# Patient Record
Sex: Male | Born: 1935 | Race: White | Hispanic: No | Smoking: Former smoker
Health system: Southern US, Community
[De-identification: ages and names within clinical notes are randomized; demographics above are authoritative.]

## PROBLEM LIST (undated history)

## (undated) DIAGNOSIS — F410 Panic disorder [episodic paroxysmal anxiety] without agoraphobia: Secondary | ICD-10-CM

## (undated) DIAGNOSIS — R112 Nausea with vomiting, unspecified: Secondary | ICD-10-CM

## (undated) DIAGNOSIS — Z8719 Personal history of other diseases of the digestive system: Secondary | ICD-10-CM

## (undated) DIAGNOSIS — Z8709 Personal history of other diseases of the respiratory system: Secondary | ICD-10-CM

## (undated) DIAGNOSIS — E119 Type 2 diabetes mellitus without complications: Secondary | ICD-10-CM

## (undated) DIAGNOSIS — C439 Malignant melanoma of skin, unspecified: Secondary | ICD-10-CM

## (undated) DIAGNOSIS — Z8614 Personal history of Methicillin resistant Staphylococcus aureus infection: Secondary | ICD-10-CM

## (undated) DIAGNOSIS — R252 Cramp and spasm: Secondary | ICD-10-CM

## (undated) DIAGNOSIS — I10 Essential (primary) hypertension: Secondary | ICD-10-CM

## (undated) DIAGNOSIS — E785 Hyperlipidemia, unspecified: Secondary | ICD-10-CM

## (undated) DIAGNOSIS — R233 Spontaneous ecchymoses: Secondary | ICD-10-CM

## (undated) DIAGNOSIS — R238 Other skin changes: Secondary | ICD-10-CM

## (undated) DIAGNOSIS — J189 Pneumonia, unspecified organism: Secondary | ICD-10-CM

## (undated) DIAGNOSIS — Z9289 Personal history of other medical treatment: Secondary | ICD-10-CM

## (undated) DIAGNOSIS — Z8711 Personal history of peptic ulcer disease: Secondary | ICD-10-CM

## (undated) DIAGNOSIS — Z9889 Other specified postprocedural states: Secondary | ICD-10-CM

## (undated) DIAGNOSIS — D649 Anemia, unspecified: Secondary | ICD-10-CM

## (undated) HISTORY — PX: REPLACEMENT TOTAL KNEE BILATERAL: SUR1225

## (undated) HISTORY — PX: JOINT REPLACEMENT: SHX530

## (undated) HISTORY — PX: COLONOSCOPY: SHX174

## (undated) HISTORY — PX: NECK SURGERY: SHX720

## (undated) HISTORY — PX: CORONARY ANGIOPLASTY: SHX604

## (undated) HISTORY — PX: OTHER SURGICAL HISTORY: SHX169

## (undated) HISTORY — PX: BACK SURGERY: SHX140

---

## 1968-07-09 HISTORY — PX: CHOLECYSTECTOMY: SHX55

## 1996-03-25 DIAGNOSIS — E7800 Pure hypercholesterolemia, unspecified: Secondary | ICD-10-CM | POA: Insufficient documentation

## 1996-03-25 DIAGNOSIS — M129 Arthropathy, unspecified: Secondary | ICD-10-CM | POA: Insufficient documentation

## 1998-07-09 HISTORY — PX: CORONARY ARTERY BYPASS GRAFT: SHX141

## 1998-09-06 DIAGNOSIS — I10 Essential (primary) hypertension: Secondary | ICD-10-CM | POA: Insufficient documentation

## 1999-06-22 ENCOUNTER — Encounter: Payer: Self-pay | Admitting: Thoracic Surgery (Cardiothoracic Vascular Surgery)

## 1999-06-22 ENCOUNTER — Inpatient Hospital Stay (HOSPITAL_COMMUNITY): Admission: RE | Admit: 1999-06-22 | Discharge: 1999-06-29 | Payer: Self-pay | Admitting: *Deleted

## 1999-06-23 ENCOUNTER — Encounter: Payer: Self-pay | Admitting: Thoracic Surgery (Cardiothoracic Vascular Surgery)

## 1999-06-24 ENCOUNTER — Encounter: Payer: Self-pay | Admitting: Thoracic Surgery (Cardiothoracic Vascular Surgery)

## 1999-06-25 ENCOUNTER — Encounter: Payer: Self-pay | Admitting: Thoracic Surgery (Cardiothoracic Vascular Surgery)

## 1999-06-26 ENCOUNTER — Encounter: Payer: Self-pay | Admitting: Thoracic Surgery (Cardiothoracic Vascular Surgery)

## 1999-06-27 ENCOUNTER — Encounter: Payer: Self-pay | Admitting: Thoracic Surgery (Cardiothoracic Vascular Surgery)

## 1999-06-28 ENCOUNTER — Encounter: Payer: Self-pay | Admitting: Thoracic Surgery (Cardiothoracic Vascular Surgery)

## 1999-10-16 ENCOUNTER — Encounter: Payer: Self-pay | Admitting: Thoracic Surgery (Cardiothoracic Vascular Surgery)

## 1999-10-16 ENCOUNTER — Encounter
Admission: RE | Admit: 1999-10-16 | Discharge: 1999-10-16 | Payer: Self-pay | Admitting: Thoracic Surgery (Cardiothoracic Vascular Surgery)

## 2000-01-15 ENCOUNTER — Encounter: Payer: Self-pay | Admitting: Thoracic Surgery (Cardiothoracic Vascular Surgery)

## 2000-01-15 ENCOUNTER — Encounter
Admission: RE | Admit: 2000-01-15 | Discharge: 2000-01-15 | Payer: Self-pay | Admitting: Thoracic Surgery (Cardiothoracic Vascular Surgery)

## 2003-04-22 DIAGNOSIS — E1121 Type 2 diabetes mellitus with diabetic nephropathy: Secondary | ICD-10-CM | POA: Insufficient documentation

## 2003-08-23 ENCOUNTER — Other Ambulatory Visit: Payer: Self-pay

## 2003-12-30 ENCOUNTER — Encounter: Admission: RE | Admit: 2003-12-30 | Discharge: 2003-12-30 | Payer: Self-pay | Admitting: Neurosurgery

## 2004-01-25 ENCOUNTER — Inpatient Hospital Stay (HOSPITAL_COMMUNITY): Admission: RE | Admit: 2004-01-25 | Discharge: 2004-01-25 | Payer: Self-pay | Admitting: Neurosurgery

## 2004-02-22 ENCOUNTER — Ambulatory Visit (HOSPITAL_COMMUNITY): Admission: RE | Admit: 2004-02-22 | Discharge: 2004-02-22 | Payer: Self-pay | Admitting: *Deleted

## 2004-04-04 ENCOUNTER — Inpatient Hospital Stay (HOSPITAL_COMMUNITY): Admission: RE | Admit: 2004-04-04 | Discharge: 2004-04-07 | Payer: Self-pay | Admitting: Neurosurgery

## 2004-05-30 ENCOUNTER — Ambulatory Visit: Payer: Self-pay | Admitting: Physician Assistant

## 2004-06-20 ENCOUNTER — Other Ambulatory Visit: Payer: Self-pay

## 2004-06-26 ENCOUNTER — Inpatient Hospital Stay: Payer: Self-pay | Admitting: Unknown Physician Specialty

## 2004-12-28 ENCOUNTER — Other Ambulatory Visit: Payer: Self-pay

## 2004-12-28 ENCOUNTER — Ambulatory Visit: Payer: Self-pay | Admitting: Unknown Physician Specialty

## 2005-01-10 ENCOUNTER — Ambulatory Visit: Payer: Self-pay | Admitting: Unknown Physician Specialty

## 2005-04-02 ENCOUNTER — Ambulatory Visit: Payer: Self-pay | Admitting: Pain Medicine

## 2005-04-10 ENCOUNTER — Ambulatory Visit: Payer: Self-pay | Admitting: Pain Medicine

## 2005-04-14 ENCOUNTER — Ambulatory Visit: Payer: Self-pay | Admitting: Pain Medicine

## 2005-04-16 ENCOUNTER — Ambulatory Visit: Payer: Self-pay | Admitting: Pain Medicine

## 2005-05-01 ENCOUNTER — Ambulatory Visit: Payer: Self-pay | Admitting: Pain Medicine

## 2005-05-14 ENCOUNTER — Ambulatory Visit: Payer: Self-pay | Admitting: Physician Assistant

## 2005-05-28 ENCOUNTER — Ambulatory Visit: Payer: Self-pay | Admitting: Pain Medicine

## 2005-05-29 ENCOUNTER — Ambulatory Visit: Payer: Self-pay | Admitting: Pain Medicine

## 2005-06-27 ENCOUNTER — Ambulatory Visit: Payer: Self-pay | Admitting: Physician Assistant

## 2005-11-19 ENCOUNTER — Ambulatory Visit: Payer: Self-pay | Admitting: Family Medicine

## 2008-03-10 ENCOUNTER — Ambulatory Visit: Payer: Self-pay | Admitting: Gastroenterology

## 2009-09-05 DIAGNOSIS — L0591 Pilonidal cyst without abscess: Secondary | ICD-10-CM | POA: Insufficient documentation

## 2010-06-28 ENCOUNTER — Other Ambulatory Visit: Payer: Self-pay | Admitting: Ophthalmology

## 2010-07-05 ENCOUNTER — Ambulatory Visit: Payer: Self-pay | Admitting: Ophthalmology

## 2010-07-09 HISTORY — PX: TOTAL HIP ARTHROPLASTY: SHX124

## 2010-07-30 ENCOUNTER — Encounter: Payer: Self-pay | Admitting: Neurosurgery

## 2010-08-30 ENCOUNTER — Ambulatory Visit: Payer: Self-pay | Admitting: Ophthalmology

## 2010-09-11 ENCOUNTER — Ambulatory Visit: Payer: Self-pay | Admitting: Ophthalmology

## 2010-10-06 ENCOUNTER — Ambulatory Visit: Payer: Self-pay | Admitting: Ophthalmology

## 2010-10-16 ENCOUNTER — Ambulatory Visit: Payer: Self-pay | Admitting: Ophthalmology

## 2010-10-19 ENCOUNTER — Ambulatory Visit: Payer: Self-pay | Admitting: Family Medicine

## 2010-12-21 ENCOUNTER — Ambulatory Visit: Payer: Self-pay | Admitting: Unknown Physician Specialty

## 2010-12-21 DIAGNOSIS — K2950 Unspecified chronic gastritis without bleeding: Secondary | ICD-10-CM | POA: Insufficient documentation

## 2011-03-13 ENCOUNTER — Ambulatory Visit: Payer: Self-pay | Admitting: General Practice

## 2011-03-28 ENCOUNTER — Inpatient Hospital Stay: Payer: Self-pay | Admitting: General Practice

## 2011-03-30 LAB — PATHOLOGY REPORT

## 2011-04-05 ENCOUNTER — Encounter: Payer: Self-pay | Admitting: Internal Medicine

## 2011-04-09 ENCOUNTER — Encounter: Payer: Self-pay | Admitting: Internal Medicine

## 2011-06-07 ENCOUNTER — Ambulatory Visit: Payer: Self-pay | Admitting: Family Medicine

## 2012-02-04 ENCOUNTER — Ambulatory Visit: Payer: Self-pay | Admitting: Family Medicine

## 2012-05-20 ENCOUNTER — Other Ambulatory Visit: Payer: Self-pay | Admitting: Physician Assistant

## 2012-05-27 ENCOUNTER — Encounter (HOSPITAL_COMMUNITY): Payer: Self-pay

## 2012-05-27 ENCOUNTER — Encounter (HOSPITAL_COMMUNITY)
Admission: RE | Admit: 2012-05-27 | Discharge: 2012-05-27 | Disposition: A | Discharge status: Home / Self Care | Payer: Medicare Other | Source: Ambulatory Visit | Attending: Anesthesiology | Admitting: Anesthesiology

## 2012-05-27 ENCOUNTER — Encounter (HOSPITAL_COMMUNITY)
Admission: RE | Admit: 2012-05-27 | Discharge: 2012-05-27 | Disposition: A | Discharge status: Home / Self Care | Payer: Medicare Other | Source: Ambulatory Visit | Attending: Orthopedic Surgery | Admitting: Orthopedic Surgery

## 2012-05-27 HISTORY — DX: Personal history of other diseases of the digestive system: Z87.19

## 2012-05-27 HISTORY — DX: Type 2 diabetes mellitus without complications: E11.9

## 2012-05-27 HISTORY — DX: Personal history of peptic ulcer disease: Z87.11

## 2012-05-27 HISTORY — DX: Pneumonia, unspecified organism: J18.9

## 2012-05-27 HISTORY — DX: Personal history of other diseases of the respiratory system: Z87.09

## 2012-05-27 HISTORY — DX: Personal history of other medical treatment: Z92.89

## 2012-05-27 HISTORY — DX: Essential (primary) hypertension: I10

## 2012-05-27 HISTORY — DX: Other specified postprocedural states: R11.2

## 2012-05-27 HISTORY — DX: Hyperlipidemia, unspecified: E78.5

## 2012-05-27 HISTORY — DX: Spontaneous ecchymoses: R23.3

## 2012-05-27 HISTORY — DX: Other skin changes: R23.8

## 2012-05-27 HISTORY — DX: Personal history of Methicillin resistant Staphylococcus aureus infection: Z86.14

## 2012-05-27 HISTORY — DX: Cramp and spasm: R25.2

## 2012-05-27 HISTORY — DX: Other specified postprocedural states: Z98.890

## 2012-05-27 HISTORY — DX: Malignant melanoma of skin, unspecified: C43.9

## 2012-05-27 LAB — CBC
HCT: 35.7 % — ABNORMAL LOW (ref 39.0–52.0)
MCV: 90.6 fL (ref 78.0–100.0)
RBC: 3.94 MIL/uL — ABNORMAL LOW (ref 4.22–5.81)
WBC: 4.6 10*3/uL (ref 4.0–10.5)

## 2012-05-27 LAB — BASIC METABOLIC PANEL
CO2: 26 mEq/L (ref 19–32)
Chloride: 102 mEq/L (ref 96–112)
Sodium: 137 mEq/L (ref 135–145)

## 2012-05-27 LAB — PROTIME-INR
INR: 0.99 (ref 0.00–1.49)
Prothrombin Time: 13 seconds (ref 11.6–15.2)

## 2012-05-27 LAB — SURGICAL PCR SCREEN
MRSA, PCR: POSITIVE — AB
Staphylococcus aureus: POSITIVE — AB

## 2012-05-27 LAB — TYPE AND SCREEN

## 2012-05-27 MED ORDER — CHLORHEXIDINE GLUCONATE 4 % EX LIQD: 60.0000 mL | Freq: Once | CUTANEOUS | Status: DC

## 2012-05-27 NOTE — Progress Notes (Signed)
Dr [facility] in [place] is cardiologist-to request ov  Stress test done about 2-20months ago-to request Heart cath done about 57yrs ago Echo done about 2-3 months ago-to request   EKG done about 2-3 months ago Denies CXR within the past yr   Dr.DOnal Radiographer, therapeutic with [facility] is medical Md

## 2012-05-27 NOTE — Progress Notes (Signed)
Sleep study done about 55yrs ago-with Dr.Khan-to request from Medical MD

## 2012-05-27 NOTE — Pre-Procedure Instructions (Signed)
[address]  05/27/2012   Your procedure is scheduled on:  Fri, Nov 22 @ 10:30 AM  Report to [facility] at 8:30 AM.  Call this number if you have problems the morning of surgery: [phone]   Remember:   Do not eat food:After Midnight.    Take these medicines the morning of surgery with A SIP OF WATER: Omeprazole(Prilosec),Metoprolol(Lopressor),Claritin(Loratadine), and Gabapentin(Neurontin)   Do not wear jewelry  Do not wear lotions, powders, or colognes. You may wear deodorant.  Men may shave face and neck.  Do not bring valuables to the hospital.  Contacts, dentures or bridgework may not be worn into surgery.  Leave suitcase in the car. After surgery it may be brought to your room.  For patients admitted to the hospital, checkout time is 11:00 AM the day of discharge.   Patients discharged the day of surgery will not be allowed to drive home.    Special Instructions: Shower using CHG 2 nights before surgery and the night before surgery.  If you shower the day of surgery use CHG.  Use special wash - you have one bottle of CHG for all showers.  You should use approximately 1/3 of the bottle for each shower.   Please read over the following fact sheets that you were given: Pain Booklet, Coughing and Deep Breathing, Blood Transfusion Information, MRSA Information and Surgical Site Infection Prevention

## 2012-05-27 NOTE — H&P (Signed)
CC: right shoulder pain and weakness HPI: 77   Y/o male    With worsening right shoulder pain secondary to osteoarthritis. Patient has elected to have a total shoulder arthroplasty to decrease pain and increase function. 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, therapeutic, former smoker, no alcohol or illicit drug use Meds: lasix, metoprolol, aspirin, centrum, fluticasone, mobic, potassium chloride, quinine, gabapentin, glimipiride, simvastatin, doxazon, loratadine, trazodone Allergies: darvon, demerol ROS: Pain and weakness right shoulder and upper extremity Vitals: 128/78 78, 12 PE: Alert and appropriate 77 y/o male   In no acute distress Cranial 2-12 grossly intact Cervical spine full rom without pain Lungs: bilateral breath sounds with no wheeze rhonchi or rales Heart: regular rate and rhythm with no murmur Abd: nontender nondistended with active bowel sounds Ext: moderate pain with rom of right shoulder, weakness 4/5 with ER and IR  neurovascularly intact distally. 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

## 2012-05-27 NOTE — Consult Note (Addendum)
Anesthesia chart review: Patient is a 77 year old male scheduled for right reverse total shoulder arthroplasty on 05/30/12 by Dr. Ranell Patrick.  History includes former smoker, morbid obesity, postoperative nausea and vomiting, hyperlipidemia, COPD, CAD, emphysema, obstructive sleep apnea (Dr. Meredeth Ide), melanoma, hiatal hernia, GERD, gastric ulcer, hypertension, diabetes mellitus type 2, prior back, neck, cataract, gallbladder, bilateral knee replacement and right hip replacement surgeries.  PCP is Dr. Mila Merry who gave medical clearance for this procedure.    Pulmonologist is Dr. Ned Clines who was notified of planned procedure and cleared patient from a pulmonary standpoint.  Cardiologist is Dr. Dorothyann Peng.  He cleared patient from a cardiology standpoint.  Additional records are still pending.  CXR on 05/27/12 showed no acute cardiopulmonary disease. A left shoulder prosthesis is present. Sternotomy wires are unchanged. Lungs are hypoinflated with mild linear density in the lateral left midlung likely scarring. There is no  consolidation or effusion. Cardiomediastinal silhouette and  remainder of the exam is unchanged.   Labs noted.  Glucose 133.  H/H 12.3/35.7.  PLT 105.  PT/PTT WNL.  T&S done.  Currently, there are no comparison labs noted.  PLT count called to Clydie Braun at Dr. Ranell Patrick' office.  She will review with Dr. Ranell Patrick or his PA [facility].  I'll follow-up once additional records are received.  Shonna Chock, PA-C 05/27/12 1559  Addendum: 05/29/12 1612 I have called [facility] twice for records (most recent stress, echo, EKG, office note).  The last stress test faxed was from 2008, although patient reported one more recently.  I attempted to call Mr. Genova today, but only got a voicemail.  His echo from 09/14/11 appeared limited (I don't see any documentation of valvular disease.  EF 55%. There was not even an EKG within the past year, so he will need one on arrival.  .  [facility]  did not have record of a stress or echo within the last three years.  His PCP records did not include a previous CBC, but since his PLT count is > 100,000 then anticipate he can proceed (surgeon's office was notified).  He has clearance notes from his PCP, Pulmonologist, and Cardiologist.

## 2012-05-29 MED ORDER — CEFAZOLIN SODIUM-DEXTROSE 2-3 GM-% IV SOLR
2.0000 g | INTRAVENOUS | Status: AC
  Administered 2012-05-30: 2 g via INTRAVENOUS
  Filled 2012-05-29: qty 50

## 2012-05-30 ENCOUNTER — Encounter (HOSPITAL_COMMUNITY): Payer: Self-pay | Admitting: Vascular Surgery

## 2012-05-30 ENCOUNTER — Inpatient Hospital Stay (HOSPITAL_COMMUNITY)
Admission: RE | Admit: 2012-05-30 | Discharge: 2012-05-31 | DRG: 484 | Disposition: A | Discharge status: Home / Self Care | Payer: Medicare Other | Source: Ambulatory Visit | Attending: Orthopedic Surgery | Admitting: Orthopedic Surgery

## 2012-05-30 ENCOUNTER — Encounter (HOSPITAL_COMMUNITY): Payer: Self-pay | Admitting: *Deleted

## 2012-05-30 ENCOUNTER — Inpatient Hospital Stay (HOSPITAL_COMMUNITY): Payer: Medicare Other | Admitting: Vascular Surgery

## 2012-05-30 ENCOUNTER — Encounter (HOSPITAL_COMMUNITY)
Admission: RE | Disposition: A | Discharge status: Home / Self Care | Payer: Self-pay | Source: Ambulatory Visit | Attending: Orthopedic Surgery

## 2012-05-30 ENCOUNTER — Inpatient Hospital Stay (HOSPITAL_COMMUNITY): Payer: Medicare Other

## 2012-05-30 DIAGNOSIS — K449 Diaphragmatic hernia without obstruction or gangrene: Secondary | ICD-10-CM | POA: Diagnosis present

## 2012-05-30 DIAGNOSIS — I10 Essential (primary) hypertension: Secondary | ICD-10-CM | POA: Diagnosis present

## 2012-05-30 DIAGNOSIS — M719 Bursopathy, unspecified: Secondary | ICD-10-CM | POA: Diagnosis present

## 2012-05-30 DIAGNOSIS — Z01818 Encounter for other preprocedural examination: Secondary | ICD-10-CM

## 2012-05-30 DIAGNOSIS — Z7982 Long term (current) use of aspirin: Secondary | ICD-10-CM

## 2012-05-30 DIAGNOSIS — F411 Generalized anxiety disorder: Secondary | ICD-10-CM | POA: Diagnosis present

## 2012-05-30 DIAGNOSIS — I2510 Atherosclerotic heart disease of native coronary artery without angina pectoris: Secondary | ICD-10-CM | POA: Diagnosis present

## 2012-05-30 DIAGNOSIS — J438 Other emphysema: Secondary | ICD-10-CM | POA: Diagnosis present

## 2012-05-30 DIAGNOSIS — K219 Gastro-esophageal reflux disease without esophagitis: Secondary | ICD-10-CM | POA: Diagnosis present

## 2012-05-30 DIAGNOSIS — Z01812 Encounter for preprocedural laboratory examination: Secondary | ICD-10-CM

## 2012-05-30 DIAGNOSIS — E785 Hyperlipidemia, unspecified: Secondary | ICD-10-CM | POA: Diagnosis present

## 2012-05-30 DIAGNOSIS — Z87891 Personal history of nicotine dependence: Secondary | ICD-10-CM

## 2012-05-30 DIAGNOSIS — E669 Obesity, unspecified: Secondary | ICD-10-CM | POA: Diagnosis present

## 2012-05-30 DIAGNOSIS — Z79899 Other long term (current) drug therapy: Secondary | ICD-10-CM

## 2012-05-30 DIAGNOSIS — M19019 Primary osteoarthritis, unspecified shoulder: Principal | ICD-10-CM | POA: Diagnosis present

## 2012-05-30 DIAGNOSIS — E119 Type 2 diabetes mellitus without complications: Secondary | ICD-10-CM | POA: Diagnosis present

## 2012-05-30 DIAGNOSIS — M67919 Unspecified disorder of synovium and tendon, unspecified shoulder: Secondary | ICD-10-CM | POA: Diagnosis present

## 2012-05-30 HISTORY — PX: REVERSE SHOULDER ARTHROPLASTY: SHX5054

## 2012-05-30 LAB — GLUCOSE, CAPILLARY
Glucose-Capillary: 158 mg/dL — ABNORMAL HIGH (ref 70–99)
Glucose-Capillary: 135 mg/dL — ABNORMAL HIGH (ref 70–99)
Glucose-Capillary: 163 mg/dL — ABNORMAL HIGH (ref 70–99)

## 2012-05-30 SURGERY — ARTHROPLASTY, SHOULDER, TOTAL, REVERSE
Anesthesia: General | Site: Shoulder | Laterality: Right | Wound class: Clean

## 2012-05-30 MED ORDER — FUROSEMIDE 40 MG PO TABS
40.0000 mg | ORAL_TABLET | Freq: Every day | ORAL | Status: DC
  Administered 2012-05-30 – 2012-05-31 (×2): 40 mg via ORAL
  Filled 2012-05-30 (×2): qty 1

## 2012-05-30 MED ORDER — METHOCARBAMOL 500 MG PO TABS: 500.0000 mg | ORAL_TABLET | Freq: Three times a day (TID) | ORAL | Status: DC | PRN

## 2012-05-30 MED ORDER — METOCLOPRAMIDE HCL 5 MG/ML IJ SOLN: 5.0000 mg | Freq: Three times a day (TID) | INTRAMUSCULAR | Status: DC | PRN

## 2012-05-30 MED ORDER — GLIMEPIRIDE 1 MG PO TABS
1.0000 mg | ORAL_TABLET | Freq: Every day | ORAL | Status: DC
  Administered 2012-05-31: 1 mg via ORAL
  Filled 2012-05-30 (×2): qty 1

## 2012-05-30 MED ORDER — OXYCODONE-ACETAMINOPHEN 5-325 MG PO TABS: 1.0000 | ORAL_TABLET | ORAL | Status: DC | PRN

## 2012-05-30 MED ORDER — SIMVASTATIN 40 MG PO TABS
40.0000 mg | ORAL_TABLET | Freq: Every day | ORAL | Status: DC
  Administered 2012-05-30: 40 mg via ORAL
  Filled 2012-05-30 (×2): qty 1

## 2012-05-30 MED ORDER — FENTANYL CITRATE 0.05 MG/ML IJ SOLN
INTRAMUSCULAR | Status: DC | PRN
  Administered 2012-05-30: 25 ug via INTRAVENOUS
  Administered 2012-05-30: 100 ug via INTRAVENOUS

## 2012-05-30 MED ORDER — GABAPENTIN 300 MG PO CAPS
300.0000 mg | ORAL_CAPSULE | Freq: Three times a day (TID) | ORAL | Status: DC
  Administered 2012-05-30 – 2012-05-31 (×3): 300 mg via ORAL
  Filled 2012-05-30 (×5): qty 1

## 2012-05-30 MED ORDER — BUPIVACAINE-EPINEPHRINE PF 0.25-1:200000 % IJ SOLN
INTRAMUSCULAR | Status: AC
  Filled 2012-05-30: qty 30

## 2012-05-30 MED ORDER — PANTOPRAZOLE SODIUM 40 MG PO TBEC
40.0000 mg | DELAYED_RELEASE_TABLET | Freq: Every day | ORAL | Status: DC
  Administered 2012-05-31: 40 mg via ORAL
  Filled 2012-05-30: qty 1

## 2012-05-30 MED ORDER — METHOCARBAMOL 500 MG PO TABS
500.0000 mg | ORAL_TABLET | Freq: Four times a day (QID) | ORAL | Status: DC | PRN
  Administered 2012-05-31: 500 mg via ORAL
  Filled 2012-05-30: qty 1

## 2012-05-30 MED ORDER — ONDANSETRON HCL 4 MG/2ML IJ SOLN
INTRAMUSCULAR | Status: AC
  Administered 2012-05-30: 4 mg via INTRAVENOUS
  Filled 2012-05-30: qty 2

## 2012-05-30 MED ORDER — MIDAZOLAM HCL 2 MG/2ML IJ SOLN
INTRAMUSCULAR | Status: AC
  Filled 2012-05-30: qty 2

## 2012-05-30 MED ORDER — ONDANSETRON HCL 4 MG/2ML IJ SOLN: 4.0000 mg | Freq: Four times a day (QID) | INTRAMUSCULAR | Status: DC | PRN

## 2012-05-30 MED ORDER — METHOCARBAMOL 100 MG/ML IJ SOLN
500.0000 mg | Freq: Four times a day (QID) | INTRAVENOUS | Status: DC | PRN
  Filled 2012-05-30: qty 5

## 2012-05-30 MED ORDER — AMOXICILLIN 500 MG PO CAPS: 500.0000 mg | ORAL_CAPSULE | ORAL | Status: DC

## 2012-05-30 MED ORDER — OXYCODONE HCL 5 MG PO TABS
5.0000 mg | ORAL_TABLET | ORAL | Status: DC | PRN
  Administered 2012-05-30: 10 mg via ORAL
  Administered 2012-05-31: 5 mg via ORAL
  Administered 2012-05-31: 10 mg via ORAL
  Administered 2012-05-31: 5 mg via ORAL
  Filled 2012-05-30: qty 1
  Filled 2012-05-30 (×2): qty 2
  Filled 2012-05-30: qty 1

## 2012-05-30 MED ORDER — HYDROMORPHONE HCL PF 1 MG/ML IJ SOLN
INTRAMUSCULAR | Status: AC
  Administered 2012-05-30: 0.25 mg via INTRAVENOUS
  Filled 2012-05-30: qty 1

## 2012-05-30 MED ORDER — LACTATED RINGERS IV SOLN
INTRAVENOUS | Status: DC | PRN
  Administered 2012-05-30 (×2): via INTRAVENOUS

## 2012-05-30 MED ORDER — SODIUM CHLORIDE 0.9 % IV SOLN: INTRAVENOUS | Status: DC

## 2012-05-30 MED ORDER — ACETAMINOPHEN 325 MG PO TABS: 650.0000 mg | ORAL_TABLET | Freq: Four times a day (QID) | ORAL | Status: DC | PRN

## 2012-05-30 MED ORDER — KETOROLAC TROMETHAMINE 30 MG/ML IJ SOLN
INTRAMUSCULAR | Status: DC | PRN
  Administered 2012-05-30: 15 mg via INTRAVENOUS

## 2012-05-30 MED ORDER — ACETAMINOPHEN 10 MG/ML IV SOLN: 1000.0000 mg | Freq: Once | INTRAVENOUS | Status: DC | PRN

## 2012-05-30 MED ORDER — ONDANSETRON HCL 4 MG/2ML IJ SOLN
4.0000 mg | Freq: Once | INTRAMUSCULAR | Status: AC | PRN
  Administered 2012-05-30: 4 mg via INTRAVENOUS

## 2012-05-30 MED ORDER — METOPROLOL TARTRATE 25 MG PO TABS
25.0000 mg | ORAL_TABLET | Freq: Two times a day (BID) | ORAL | Status: DC
  Administered 2012-05-30 – 2012-05-31 (×2): 25 mg via ORAL
  Filled 2012-05-30 (×3): qty 1

## 2012-05-30 MED ORDER — ACETAMINOPHEN 650 MG RE SUPP: 650.0000 mg | Freq: Four times a day (QID) | RECTAL | Status: DC | PRN

## 2012-05-30 MED ORDER — ONDANSETRON HCL 4 MG PO TABS: 4.0000 mg | ORAL_TABLET | Freq: Four times a day (QID) | ORAL | Status: DC | PRN

## 2012-05-30 MED ORDER — PHENYLEPHRINE HCL 10 MG/ML IJ SOLN
INTRAMUSCULAR | Status: DC | PRN
  Administered 2012-05-30 (×5): 80 ug via INTRAVENOUS

## 2012-05-30 MED ORDER — METOCLOPRAMIDE HCL 10 MG PO TABS: 5.0000 mg | ORAL_TABLET | Freq: Three times a day (TID) | ORAL | Status: DC | PRN

## 2012-05-30 MED ORDER — ONDANSETRON HCL 4 MG/2ML IJ SOLN
INTRAMUSCULAR | Status: DC | PRN
  Administered 2012-05-30: 4 mg via INTRAVENOUS

## 2012-05-30 MED ORDER — FLUTICASONE PROPIONATE 50 MCG/ACT NA SUSP
2.0000 | Freq: Every day | NASAL | Status: DC
  Administered 2012-05-31: 2 via NASAL
  Filled 2012-05-30: qty 16

## 2012-05-30 MED ORDER — ACETAMINOPHEN 10 MG/ML IV SOLN
1000.0000 mg | Freq: Once | INTRAVENOUS | Status: AC
  Administered 2012-05-30: 1000 mg via INTRAVENOUS
  Filled 2012-05-30: qty 100

## 2012-05-30 MED ORDER — ACETAMINOPHEN 10 MG/ML IV SOLN
INTRAVENOUS | Status: AC
  Filled 2012-05-30: qty 100

## 2012-05-30 MED ORDER — FENTANYL CITRATE 0.05 MG/ML IJ SOLN
INTRAMUSCULAR | Status: AC
  Filled 2012-05-30: qty 2

## 2012-05-30 MED ORDER — QUININE SULFATE 324 MG PO CAPS
648.0000 mg | ORAL_CAPSULE | Freq: Three times a day (TID) | ORAL | Status: DC | PRN
  Filled 2012-05-30: qty 2

## 2012-05-30 MED ORDER — MELOXICAM 15 MG PO TABS
15.0000 mg | ORAL_TABLET | Freq: Every day | ORAL | Status: DC
  Administered 2012-05-30 – 2012-05-31 (×2): 15 mg via ORAL
  Filled 2012-05-30 (×2): qty 1

## 2012-05-30 MED ORDER — MIDAZOLAM HCL 2 MG/2ML IJ SOLN
2.0000 mg | Freq: Once | INTRAMUSCULAR | Status: AC
  Administered 2012-05-30: 2 mg via INTRAVENOUS

## 2012-05-30 MED ORDER — INSULIN ASPART 100 UNIT/ML ~~LOC~~ SOLN
0.0000 [IU] | Freq: Three times a day (TID) | SUBCUTANEOUS | Status: DC
  Administered 2012-05-31: 3 [IU] via SUBCUTANEOUS

## 2012-05-30 MED ORDER — ASPIRIN 81 MG PO CHEW
81.0000 mg | CHEWABLE_TABLET | Freq: Every day | ORAL | Status: DC
  Administered 2012-05-31: 81 mg via ORAL
  Filled 2012-05-30: qty 1

## 2012-05-30 MED ORDER — LACTATED RINGERS IV SOLN
INTRAVENOUS | Status: DC
  Administered 2012-05-30: 10:00:00 via INTRAVENOUS

## 2012-05-30 MED ORDER — POTASSIUM CHLORIDE CRYS ER 20 MEQ PO TBCR
20.0000 meq | EXTENDED_RELEASE_TABLET | Freq: Every day | ORAL | Status: DC
  Administered 2012-05-30 – 2012-05-31 (×2): 20 meq via ORAL
  Filled 2012-05-30 (×3): qty 1

## 2012-05-30 MED ORDER — MENTHOL 3 MG MT LOZG: 1.0000 | LOZENGE | OROMUCOSAL | Status: DC | PRN

## 2012-05-30 MED ORDER — MORPHINE SULFATE 2 MG/ML IJ SOLN: 2.0000 mg | INTRAMUSCULAR | Status: DC | PRN

## 2012-05-30 MED ORDER — MUPIROCIN 2 % EX OINT
TOPICAL_OINTMENT | CUTANEOUS | Status: AC
  Administered 2012-05-30: 1 via NASAL
  Filled 2012-05-30: qty 22

## 2012-05-30 MED ORDER — LIDOCAINE HCL (CARDIAC) 20 MG/ML IV SOLN
INTRAVENOUS | Status: DC | PRN
  Administered 2012-05-30: 40 mg via INTRAVENOUS

## 2012-05-30 MED ORDER — PHENOL 1.4 % MT LIQD: 1.0000 | OROMUCOSAL | Status: DC | PRN

## 2012-05-30 MED ORDER — ROCURONIUM BROMIDE 100 MG/10ML IV SOLN
INTRAVENOUS | Status: DC | PRN
  Administered 2012-05-30: 50 mg via INTRAVENOUS

## 2012-05-30 MED ORDER — PROPOFOL 10 MG/ML IV BOLUS
INTRAVENOUS | Status: DC | PRN
  Administered 2012-05-30: 150 mg via INTRAVENOUS

## 2012-05-30 MED ORDER — ADULT MULTIVITAMIN W/MINERALS CH
1.0000 | ORAL_TABLET | Freq: Every day | ORAL | Status: DC
  Administered 2012-05-31: 1 via ORAL
  Filled 2012-05-30 (×2): qty 1

## 2012-05-30 MED ORDER — INSULIN ASPART 100 UNIT/ML ~~LOC~~ SOLN
4.0000 [IU] | Freq: Three times a day (TID) | SUBCUTANEOUS | Status: DC
  Administered 2012-05-31: 4 [IU] via SUBCUTANEOUS

## 2012-05-30 MED ORDER — ASPIRIN 81 MG PO TABS: 81.0000 mg | ORAL_TABLET | Freq: Every day | ORAL | Status: DC

## 2012-05-30 MED ORDER — FENTANYL CITRATE 0.05 MG/ML IJ SOLN
100.0000 ug | Freq: Once | INTRAMUSCULAR | Status: AC
  Administered 2012-05-30: 100 ug via INTRAVENOUS

## 2012-05-30 MED ORDER — TRAZODONE HCL 50 MG PO TABS
50.0000 mg | ORAL_TABLET | Freq: Every day | ORAL | Status: DC
  Administered 2012-05-30: 50 mg via ORAL
  Filled 2012-05-30 (×2): qty 1

## 2012-05-30 MED ORDER — ACETAMINOPHEN 10 MG/ML IV SOLN
1000.0000 mg | Freq: Four times a day (QID) | INTRAVENOUS | Status: DC
  Administered 2012-05-30 – 2012-05-31 (×3): 1000 mg via INTRAVENOUS
  Filled 2012-05-30 (×4): qty 100

## 2012-05-30 MED ORDER — LORATADINE 10 MG PO TABS
10.0000 mg | ORAL_TABLET | Freq: Every day | ORAL | Status: DC
  Administered 2012-05-30 – 2012-05-31 (×2): 10 mg via ORAL
  Filled 2012-05-30 (×2): qty 1

## 2012-05-30 MED ORDER — DOXAZOSIN MESYLATE 8 MG PO TABS
8.0000 mg | ORAL_TABLET | Freq: Every day | ORAL | Status: DC
  Administered 2012-05-30: 8 mg via ORAL
  Filled 2012-05-30 (×2): qty 1

## 2012-05-30 MED ORDER — INSULIN ASPART 100 UNIT/ML ~~LOC~~ SOLN: 0.0000 [IU] | Freq: Every day | SUBCUTANEOUS | Status: DC

## 2012-05-30 MED ORDER — BISACODYL 10 MG RE SUPP: 10.0000 mg | Freq: Every day | RECTAL | Status: DC | PRN

## 2012-05-30 MED ORDER — SODIUM CHLORIDE 0.9 % IR SOLN
Status: DC | PRN
  Administered 2012-05-30: 1000 mL

## 2012-05-30 MED ORDER — CEFAZOLIN SODIUM-DEXTROSE 2-3 GM-% IV SOLR
2.0000 g | Freq: Four times a day (QID) | INTRAVENOUS | Status: AC
  Administered 2012-05-30 – 2012-05-31 (×3): 2 g via INTRAVENOUS
  Filled 2012-05-30 (×3): qty 50

## 2012-05-30 MED ORDER — BUPIVACAINE-EPINEPHRINE 0.25% -1:200000 IJ SOLN
INTRAMUSCULAR | Status: DC | PRN
  Administered 2012-05-30: 7 mL

## 2012-05-30 MED ORDER — PHENYLEPHRINE HCL 10 MG/ML IJ SOLN
10.0000 mg | INTRAVENOUS | Status: DC | PRN
  Administered 2012-05-30: 25 ug/min via INTRAVENOUS

## 2012-05-30 MED ORDER — HYDROMORPHONE HCL PF 1 MG/ML IJ SOLN
0.2500 mg | INTRAMUSCULAR | Status: DC | PRN
  Administered 2012-05-30: 0.5 mg via INTRAVENOUS
  Administered 2012-05-30: 0.25 mg via INTRAVENOUS

## 2012-05-30 SURGICAL SUPPLY — 62 items
BIT DRILL 170X2.5X (BIT) IMPLANT
BIT DRL 170X2.5X (BIT)
BLADE SAG 18X100X1.27 (BLADE) ×2 IMPLANT
CLOTH BEACON ORANGE TIMEOUT ST (SAFETY) ×2 IMPLANT
CLSR STERI-STRIP ANTIMIC 1/2X4 (GAUZE/BANDAGES/DRESSINGS) ×2 IMPLANT
COVER SURGICAL LIGHT HANDLE (MISCELLANEOUS) ×2 IMPLANT
DRAPE INCISE IOBAN 66X45 STRL (DRAPES) ×2 IMPLANT
DRAPE U-SHAPE 47X51 STRL (DRAPES) ×2 IMPLANT
DRAPE X-RAY CASS 24X20 (DRAPES) IMPLANT
DRILL 2.5 (BIT)
DRSG ADAPTIC 3X8 NADH LF (GAUZE/BANDAGES/DRESSINGS) ×2 IMPLANT
DRSG PAD ABDOMINAL 8X10 ST (GAUZE/BANDAGES/DRESSINGS) ×2 IMPLANT
DURAPREP 26ML APPLICATOR (WOUND CARE) ×2 IMPLANT
ELECT BLADE 4.0 EZ CLEAN MEGAD (MISCELLANEOUS) ×2
ELECT NEEDLE TIP 2.8 STRL (NEEDLE) ×2 IMPLANT
ELECT REM PT RETURN 9FT ADLT (ELECTROSURGICAL) ×2
ELECTRODE BLDE 4.0 EZ CLN MEGD (MISCELLANEOUS) ×1 IMPLANT
ELECTRODE REM PT RTRN 9FT ADLT (ELECTROSURGICAL) ×1 IMPLANT
GLOVE BIOGEL PI ORTHO PRO 7.5 (GLOVE) ×1
GLOVE BIOGEL PI ORTHO PRO SZ8 (GLOVE) ×1
GLOVE ORTHO TXT STRL SZ7.5 (GLOVE) ×2 IMPLANT
GLOVE PI ORTHO PRO STRL 7.5 (GLOVE) ×1 IMPLANT
GLOVE PI ORTHO PRO STRL SZ8 (GLOVE) ×1 IMPLANT
GLOVE SURG ORTHO 8.5 STRL (GLOVE) ×2 IMPLANT
GOWN STRL NON-REIN LRG LVL3 (GOWN DISPOSABLE) ×2 IMPLANT
GOWN STRL REIN XL XLG (GOWN DISPOSABLE) ×4 IMPLANT
HANDPIECE INTERPULSE COAX TIP (DISPOSABLE)
KIT BASIN OR (CUSTOM PROCEDURE TRAY) ×2 IMPLANT
KIT ROOM TURNOVER OR (KITS) ×2 IMPLANT
MANIFOLD NEPTUNE II (INSTRUMENTS) ×2 IMPLANT
NEEDLE 1/2 CIR MAYO (NEEDLE) ×2 IMPLANT
NEEDLE HYPO 25GX1X1/2 BEV (NEEDLE) ×2 IMPLANT
NS IRRIG 1000ML POUR BTL (IV SOLUTION) ×2 IMPLANT
PACK SHOULDER (CUSTOM PROCEDURE TRAY) ×2 IMPLANT
PAD ARMBOARD 7.5X6 YLW CONV (MISCELLANEOUS) ×4 IMPLANT
PIN GUIDE 1.2 (PIN) IMPLANT
PIN GUIDE GLENOPHERE 1.5MX300M (PIN) IMPLANT
PIN METAGLENE 2.5 (PIN) IMPLANT
SET HNDPC FAN SPRY TIP SCT (DISPOSABLE) IMPLANT
SLING ARM FOAM STRAP LRG (SOFTGOODS) IMPLANT
SLING ARM FOAM STRAP MED (SOFTGOODS) IMPLANT
SLING ARM IMMOBILIZER LRG (SOFTGOODS) ×2 IMPLANT
SPONGE GAUZE 4X4 12PLY (GAUZE/BANDAGES/DRESSINGS) ×2 IMPLANT
SPONGE LAP 18X18 X RAY DECT (DISPOSABLE) ×2 IMPLANT
SPONGE LAP 4X18 X RAY DECT (DISPOSABLE) ×2 IMPLANT
STRIP CLOSURE SKIN 1/2X4 (GAUZE/BANDAGES/DRESSINGS) ×2 IMPLANT
SUCTION FRAZIER TIP 10 FR DISP (SUCTIONS) ×2 IMPLANT
SUT FIBERWIRE #2 38 T-5 BLUE (SUTURE) ×2
SUT MNCRL AB 4-0 PS2 18 (SUTURE) ×2 IMPLANT
SUT VIC AB 2-0 CT1 27 (SUTURE) ×2
SUT VIC AB 2-0 CT1 TAPERPNT 27 (SUTURE) ×1 IMPLANT
SUT VICRYL 0 CT 1 36IN (SUTURE) ×4 IMPLANT
SUTURE FIBERWR #2 38 T-5 BLUE (SUTURE) ×1 IMPLANT
SWABSTICK BENZOIN STERILE (MISCELLANEOUS) ×2 IMPLANT
SYR CONTROL 10ML LL (SYRINGE) ×2 IMPLANT
TAPE CLOTH SURG 6X10 WHT LF (GAUZE/BANDAGES/DRESSINGS) ×2 IMPLANT
TOWEL OR 17X24 6PK STRL BLUE (TOWEL DISPOSABLE) ×2 IMPLANT
TOWEL OR 17X26 10 PK STRL BLUE (TOWEL DISPOSABLE) ×2 IMPLANT
TOWER CARTRIDGE SMART MIX (DISPOSABLE) IMPLANT
TRAY FOLEY CATH 14FR (SET/KITS/TRAYS/PACK) ×2 IMPLANT
WATER STERILE IRR 1000ML POUR (IV SOLUTION) ×2 IMPLANT
YANKAUER SUCT BULB TIP NO VENT (SUCTIONS) ×2 IMPLANT

## 2012-05-30 NOTE — Progress Notes (Signed)
Pt was put on home CPAP at 08:11. Pt own unit using a nasal mask. CPAP set at [facility]. Vitals are stable HR 105. RR 18, SPO2 95% and  Breath sounds are diminished. Pt says he is comfortable and RT will continue to monitor.

## 2012-05-30 NOTE — Preoperative (Signed)
Beta Blockers   Reason not to administer Beta Blockers:Not Applicable 

## 2012-05-30 NOTE — Anesthesia Procedure Notes (Addendum)
Anesthesia Regional Block:  Interscalene brachial plexus block  Pre-Anesthetic Checklist: ,, timeout performed,, Correct Site, Correct Laterality, Correct Procedure, Correct Position, site marked, risks and benefits discussed, Surgical consent,  Pre-op evaluation,  At surgeon's request and post-op pain management  Laterality: Right  Prep: chloraprep       Needles:   Needle Type: Echogenic Stimulator Needle      Needle Gauge: 22 and 22 G    Additional Needles:  Procedures: ultrasound guided (picture in chart) Interscalene brachial plexus block Narrative:  Start time: 05/30/2012 10:05 AM End time: 05/30/2012 10:15 AM Injection made incrementally with aspirations every 20 mL.  Performed by: Personally   Additional Notes: 20 cc 0.5 % marcaine with 1:200 Epi  Kipp Brood, MD  Interscalene brachial plexus block Procedure Name: Intubation Date/Time: 05/30/2012 11:09 AM Performed by: Jerilee Hoh Pre-anesthesia Checklist: Patient identified, Emergency Drugs available, Suction available and Patient being monitored Patient Re-evaluated:Patient Re-evaluated prior to inductionOxygen Delivery Method: Circle system utilized Preoxygenation: Pre-oxygenation with 100% oxygen Intubation Type: IV induction Ventilation: Mask ventilation without difficulty and Oral airway inserted - appropriate to patient size Laryngoscope Size: Mac and 4 Grade View: Grade II Tube type: Oral Tube size: 7.5 mm Number of attempts: 1 Airway Equipment and Method: Stylet Placement Confirmation: ETT inserted through vocal cords under direct vision,  positive ETCO2 and breath sounds checked- equal and bilateral Secured at: 21 cm Tube secured with: Tape Dental Injury: Teeth and Oropharynx as per pre-operative assessment

## 2012-05-30 NOTE — Brief Op Note (Signed)
05/30/2012  1:31 PM  PATIENT:  Mark Terry  77 y.o. male  PRE-OPERATIVE DIAGNOSIS:  RIGHT SHOULDER OA, end staged with rotator cuff insufficiency  POST-OPERATIVE DIAGNOSIS:  RIGHT SHOULDER OA, end staged with rotator cuff insufficiency  PROCEDURE:  Procedure(s) (LRB) with comments: REVERSE SHOULDER ARTHROPLASTY (Right) - right reverse shoulder arthroplasty, DePuy Delta Xtend  SURGEON:  Surgeon(s) and Role:    * Verlee Rossetti, MD - Primary  PHYSICIAN ASSISTANT:   ASSISTANTS: Thea Gist, PA-C   ANESTHESIA:   regional and general  EBL:  Total I/O In: 1300 [I.V.:1300] Out: 75 [Blood:75]  BLOOD ADMINISTERED:none  DRAINS: none   LOCAL MEDICATIONS USED:  MARCAINE     SPECIMEN:  No Specimen  DISPOSITION OF SPECIMEN:  N/A  COUNTS:  YES  TOURNIQUET:  * No tourniquets in log *  DICTATION: .Other Dictation: Dictation Number [id number]  PLAN OF CARE: Admit to inpatient   PATIENT DISPOSITION:  PACU - hemodynamically stable.   Delay start of Pharmacological VTE agent (>24hrs) due to surgical blood loss or risk of bleeding: not applicable

## 2012-05-30 NOTE — Transfer of Care (Signed)
Immediate Anesthesia Transfer of Care Note  Patient: Mark Terry  Procedure(s) Performed: Procedure(s) (LRB) with comments: REVERSE SHOULDER ARTHROPLASTY (Right) - right reverse shoulder arthroplasty  Patient Location: PACU  Anesthesia Type:GA combined with regional for post-op pain  Level of Consciousness: awake, alert , oriented and patient cooperative  Airway & Oxygen Therapy: Patient Spontanous Breathing and Patient connected to face mask oxygen  Post-op Assessment: Report given to PACU RN, Post -op Vital signs reviewed and stable and Patient moving all extremities  Post vital signs: Reviewed and stable  Complications: No apparent anesthesia complications

## 2012-05-30 NOTE — Progress Notes (Signed)
Port shoulder xray done

## 2012-05-30 NOTE — Anesthesia Postprocedure Evaluation (Signed)
  Anesthesia Post-op Note  Patient: Mark Terry  Procedure(s) Performed: Procedure(s) (LRB) with comments: REVERSE SHOULDER ARTHROPLASTY (Right) - right reverse shoulder arthroplasty  Patient Location: PACU  Anesthesia Type:General and GA combined with regional for post-op pain  Level of Consciousness: awake, alert  and oriented  Airway and Oxygen Therapy: Patient Spontanous Breathing and Patient connected to nasal cannula oxygen  Post-op Pain: none  Post-op Assessment: Post-op Vital signs reviewed and Patient's Cardiovascular Status Stable  Post-op Vital Signs: stable  Complications: No apparent anesthesia complications

## 2012-05-30 NOTE — Interval H&P Note (Signed)
History and Physical Interval Note:  05/30/2012 10:12 AM  Mark Terry  has presented today for surgery, with the diagnosis of RIGHT SHOULDER OA  The various methods of treatment have been discussed with the patient and family. After consideration of risks, benefits and other options for treatment, the patient has consented to  Procedure(s) (LRB) with comments: REVERSE SHOULDER ARTHROPLASTY (Right) - GENERAL WITH INTERSCALINE BLOCK as a surgical intervention .  The patient's history has been reviewed, patient examined, no change in status, stable for surgery.  I have reviewed the patient's chart and labs.  Questions were answered to the patient's satisfaction.     [name],STEVEN R

## 2012-05-30 NOTE — Anesthesia Preprocedure Evaluation (Addendum)
Anesthesia Evaluation  Patient identified by MRN, date of birth, ID band Patient awake and Patient confused    Reviewed: Allergy & Precautions, NPO status , Patient's Chart, lab work & pertinent test results, reviewed documented beta blocker date and time   History of Anesthesia Complications (+) PONV  Airway Mallampati: II      Dental  (+) Teeth Intact and Dental Advisory Given   Pulmonary  breath sounds clear to auscultation        Cardiovascular Rhythm:Regular Rate:Normal     Neuro/Psych    GI/Hepatic   Endo/Other    Renal/GU      Musculoskeletal   Abdominal (+) + obese,   Peds  Hematology   Anesthesia Other Findings   Reproductive/Obstetrics                           Anesthesia Physical Anesthesia Plan  ASA: III  Anesthesia Plan: General   Post-op Pain Management:    Induction: Intravenous  Airway Management Planned: Oral ETT  Additional Equipment:   Intra-op Plan:   Post-operative Plan: Extubation in OR  Informed Consent: I have reviewed the patients History and Physical, chart, labs and discussed the procedure including the risks, benefits and alternatives for the proposed anesthesia with the patient or authorized representative who has indicated his/her understanding and acceptance.   Dental advisory given  Plan Discussed with: CRNA and Surgeon  Anesthesia Plan Comments: (DJD R. Shoulder CAD S/P CABG 1998 no angina, negative stress test 2008 echo from 09/14/11  EF 55% Type 2 DM glucose 158 Obesity Sleep apnea on CPAP Htn  Plan GA with interscalene block  Kipp Brood, MD)       Anesthesia Quick Evaluation

## 2012-05-31 LAB — BASIC METABOLIC PANEL
BUN: 23 mg/dL (ref 6–23)
Chloride: 105 mEq/L (ref 96–112)
GFR calc Af Amer: 65 mL/min — ABNORMAL LOW (ref >90)
Potassium: 4 mEq/L (ref 3.5–5.1)

## 2012-05-31 LAB — GLUCOSE, CAPILLARY: Glucose-Capillary: 168 mg/dL — ABNORMAL HIGH (ref 70–99)

## 2012-05-31 LAB — HEMOGLOBIN AND HEMATOCRIT, BLOOD: Hemoglobin: 10.8 g/dL — ABNORMAL LOW (ref 13.0–17.0)

## 2012-05-31 MED ORDER — CHLORHEXIDINE GLUCONATE CLOTH 2 % EX PADS
6.0000 | MEDICATED_PAD | Freq: Every day | CUTANEOUS | Status: DC
  Administered 2012-05-31: 6 via TOPICAL

## 2012-05-31 MED ORDER — MUPIROCIN 2 % EX OINT
1.0000 "application " | TOPICAL_OINTMENT | Freq: Two times a day (BID) | CUTANEOUS | Status: DC
  Administered 2012-05-31: 1 via NASAL
  Filled 2012-05-31: qty 22

## 2012-05-31 NOTE — Discharge Summary (Signed)
Physician Discharge Summary  Patient ID: GAVAN NORDBY MRN: [id number] DOB/AGE: [id number] 75 y.o.  Admit date: 05/30/2012 Discharge date: 05/31/2012  Admission Diagnoses:  Shoulder arthritis  Discharge Diagnoses:  Same   Surgeries: Procedure(s): REVERSE SHOULDER ARTHROPLASTY on 05/30/2012   Consultants: OT  Discharged Condition: Stable  Hospital Course: AUDEL COAKLEY is an 77 y.o. male who was admitted 05/30/2012 with a chief complaint of shoulder pain, and found to have a diagnosis of shoulder arthritis.  They were brought to the operating room on 05/30/2012 and underwent the above named procedures.    The patient had an uncomplicated hospital course and was stable for discharge.  Recent vital signs:  Filed Vitals:   05/31/12 0522  BP: 121/64  Pulse: 90  Temp: 97.9 F (36.6 C)  Resp: 16    Recent laboratory studies:  Results for orders placed during the hospital encounter of 05/30/12  GLUCOSE, CAPILLARY      Component Value Range   Glucose-Capillary 158 (*) 70 - 99 mg/dL  GLUCOSE, CAPILLARY      Component Value Range   Glucose-Capillary 135 (*) 70 - 99 mg/dL  GLUCOSE, CAPILLARY      Component Value Range   Glucose-Capillary 147 (*) 70 - 99 mg/dL  HEMOGLOBIN AND HEMATOCRIT, BLOOD      Component Value Range   Hemoglobin 10.8 (*) 13.0 - 17.0 g/dL   HCT 86.5 (*) 78.4 - 69.6 %  GLUCOSE, CAPILLARY      Component Value Range   Glucose-Capillary 163 (*) 70 - 99 mg/dL  GLUCOSE, CAPILLARY      Component Value Range   Glucose-Capillary 168 (*) 70 - 99 mg/dL    Discharge Medications:     Medication List     As of 05/31/2012  8:13 AM    TAKE these medications         amoxicillin 500 MG capsule   Commonly known as: AMOXIL   Take 500 mg by mouth See admin instructions. Take prior to dental procedure      aspirin 81 MG tablet   Take 81 mg by mouth daily.      doxazosin 8 MG tablet   Commonly known as: CARDURA   Take 8 mg by mouth at bedtime.     fluticasone 50 MCG/ACT nasal spray   Commonly known as: FLONASE   Place 2 sprays into the nose daily.      furosemide 40 MG tablet   Commonly known as: LASIX   Take 40 mg by mouth daily.      gabapentin 300 MG capsule   Commonly known as: NEURONTIN   Take 300 mg by mouth 3 (three) times daily.      glimepiride 1 MG tablet   Commonly known as: AMARYL   Take 1 mg by mouth daily before breakfast.      loratadine 10 MG tablet   Commonly known as: CLARITIN   Take 10 mg by mouth daily.      meloxicam 15 MG tablet   Commonly known as: MOBIC   Take 15 mg by mouth daily.      methocarbamol 500 MG tablet   Commonly known as: ROBAXIN   Take 1 tablet (500 mg total) by mouth 3 (three) times daily as needed.      metoprolol tartrate 25 MG tablet   Commonly known as: LOPRESSOR   Take 25 mg by mouth 2 (two) times daily.      multivitamin with minerals Tabs  Take 1 tablet by mouth daily.      omeprazole 20 MG capsule   Commonly known as: PRILOSEC   Take 20 mg by mouth daily.      oxyCODONE-acetaminophen 5-325 MG per tablet   Commonly known as: PERCOCET/ROXICET   Take 1-2 tablets by mouth every 4 (four) hours as needed for pain.      potassium chloride SA 20 MEQ tablet   Commonly known as: K-DUR,KLOR-CON   Take 20 mEq by mouth daily.      quiNINE 324 MG capsule   Commonly known as: QUALAQUIN   Take 648 mg by mouth every 8 (eight) hours. For leg cramps      simvastatin 40 MG tablet   Commonly known as: ZOCOR   Take 40 mg by mouth every evening.      traZODone 50 MG tablet   Commonly known as: DESYREL   Take 50 mg by mouth at bedtime.        Diagnostic Studies: Dg Chest 2 View  05/27/2012  *RADIOLOGY REPORT*  Clinical Data: Right total shoulder.  Reverse right shoulder arthroplasty.  CHEST - 2 VIEW  Comparison: 01/25/2004  Findings: A left shoulder prosthesis is present.  Sternotomy wires are unchanged.  Lungs are hypoinflated with mild linear density in the lateral left  midlung likely scarring.  There is no consolidation or effusion.  Cardiomediastinal silhouette and remainder of the exam is unchanged.  IMPRESSION: No acute cardiopulmonary disease.   Original Report Authenticated By: Elberta Fortis, M.D.    Dg Shoulder Right Port  05/30/2012  *RADIOLOGY REPORT*  Clinical Data: Post right total shoulder replacement  PORTABLE RIGHT SHOULDER - 2+ VIEW  Comparison: None.  Findings:  Post right total shoulder replacement without definite evidence of hardware failure or loosening.  No definite fracture or dislocation on this solitary AP projection radiograph.  There is a minimal amount of expected intra-articular air.  Limited visualization of adjacent thorax suggests a possible small right-sided pleural effusion and right basilar opacities.  IMPRESSION: Post right total shoulder replacement without definite evidence of complicating features.   Original Report Authenticated By: Tacey Ruiz, MD     Disposition:  home       Follow-up Information    Follow up with Verlee Rossetti, MD. Call in 2 weeks. [phone])    Contact information:   [address] [place] [address] [phone]           Signed: Verlee Rossetti 05/31/2012, 8:13 AM

## 2012-05-31 NOTE — Op Note (Signed)
NAMEJABORI, HENEGAR NO.:  [id number]  MEDICAL RECORD NO.:  [id number]  LOCATION:  5N29C                        FACILITY:  [facility]  PHYSICIAN:  Almedia Balls. Ranell Patrick, M.D. DATE OF BIRTH:  1934-09-26  DATE OF PROCEDURE:  05/30/2012 DATE OF DISCHARGE:                              OPERATIVE REPORT   PREOPERATIVE DIAGNOSIS:  Right shoulder end-stage osteoarthritis with rotator cuff insufficiency.  POSTOPERATIVE DIAGNOSIS:  Right shoulder end-stage osteoarthritis with rotator cuff insufficiency.  PROCEDURE PERFORMED:  Right shoulder reverse total shoulder arthroplasty using DePuy Delta Extend prosthesis.  ATTENDING SURGEON:  Almedia Balls. Ranell Patrick, MD  ASSISTANT:  Donnie Coffin. Dixon, PA-C who scrubbed for the entire procedure and necessary for satisfactory completion of surgery.  ANESTHESIA:  General anesthesia was used plus interscalene block.  ESTIMATED BLOOD LOSS:  100 mL.  FLUID REPLACEMENT:  1500 mL of crystalloid.  INSTRUMENT COUNTS:  Correct.  COMPLICATIONS:  There were no complications.  Perioperative antibiotics given.  INDICATIONS:  The patient is a 77 year old male with worsening right shoulder pain secondary to end-stage arthritis.  The patient also has poor function and loss of fixed fulcrum mechanics secondary to rotator cuff insufficiency.  The patient has failed all measures of conservative management and desires operative treatment to restore function and limit pain in the right shoulder.  Informed consent obtained.  DESCRIPTION OF PROCEDURE:  After an adequate level of anesthesia achieved, the patient was positioned in a modified beach-chair position. Right shoulder was correctly identified.  Time-out called.  Sterile prep and drape of the shoulder and arm performed.  We entered the shoulder and used standard deltopectoral incision starting at the anterior coracoid process and extending down to the anterior humeral shaft. Dissection down through  subcutaneous tissues using the Bovie.  We identified the cephalic vein and took it laterally to the deltoid.  We then went ahead and identified the pectoralis and released the upper centimeter of pectoralis.  We retracted the conjoined tendon medially. We then released the subscapularis which was quite thin and degenerative in appearance.  We released that off the lesser tuberosity.  We went ahead and placed tag sutures in the subscapularis, although we did not feel like it would be sufficiently intact to repair at the end of surgery.  We then went ahead and released the remaining rotator cuff tissue which was very thin and atrophic over the top of the greater tuberosity.  We went ahead at this point and delivered the humeral head out of the wound.  We then entered the proximal humerus with a 6 mm reamer.  We then reamed up to size 14 until we got good cortical bite distally.  We then placed our intramedullary resection guide and resected the proximal humerus with an oscillating saw set on 10 degrees of retroversion.  We then milled the proximal humerus for the metaphyseal component and noted there to be a size 2 epi right, so we had a 14 body size 2, epi right metaphyseal portion to the prosthesis and then impacted the trial prosthesis into place, set on 10 degrees of retroversion.  We then retracted the humerus posteriorly, did a 360 degree capsular removal, protecting the axillary nerve.  Once we had good exposure of the glenoid, placed our center guide pin. We then reamed for the metaglene and then drilled out the central PEG hole.  We impacted the metaglene in position placing 2 lock screws, 36 in the base of the coracoid and 48 down into the inferior screw into the scapula and scapular body and then an 18 anteriorly and posteriorly.  Good purchase on those screws were locked into position.  We then placed our 42 standard glenosphere and screwed that into position.  Next, we went  and trialed with a 42+ 6 trial prosthesis.  We reduced the shoulder and were happy with soft tissue balancing.  We then removed our trial components, thoroughly irrigated, and the canal and then impacted our 14 stem hydroxyapatite coated with epi to right metaphyseal portion and impacted that into position with impaction grafting technique with some available bone graft from the humeral head.  Next, we placed our again trial with a +6 and we were happy with that and we placed our real 42 +6 poly, reduced the shoulder, had nice soft tissue tensioning including the conjoin tendon.  Axillary nerve was not under undue tension.  We ranged the shoulder fully, no gapping in external rotation, and negative sulcus.  We thoroughly irrigated the wound.  We resected the remaining subscapularis.  We then closed deltopectoral interval with 0 Vicryl suture followed by 2-0 Vicryl for subcutaneous closure and 4-0 Monocryl for skin.  Steri-Strips were applied followed by sterile dressing.  The patient tolerated the surgery well.     Almedia Balls. Ranell Patrick, M.D.     SRN/MEDQ  D:  05/30/2012  T:  05/31/2012  Job:  [id number]

## 2012-05-31 NOTE — Progress Notes (Signed)
Orthopedics Progress Note  Subjective: I feel better this AM.  Objective:  Filed Vitals:   05/31/12 0522  BP: 121/64  Pulse: 90  Temp: 97.9 F (36.6 C)  Resp: 16    General: Awake and alert  Musculoskeletal: Shoulder dressing clean and dry and intact Minimal pain with AAROM Neurovascularly intact  Lab Results  Component Value Date   WBC 4.6 05/27/2012   HGB 10.8* 05/31/2012   HCT 31.1* 05/31/2012   MCV 90.6 05/27/2012   PLT 105* 05/27/2012       Component Value Date/Time   NA 137 05/27/2012 0906   K 4.2 05/27/2012 0906   CL 102 05/27/2012 0906   CO2 26 05/27/2012 0906   GLUCOSE 133* 05/27/2012 0906   BUN 19 05/27/2012 0906   CREATININE 1.14 05/27/2012 0906   CALCIUM 10.0 05/27/2012 0906   GFRNONAA 61* 05/27/2012 0906   GFRAA 71* 05/27/2012 0906    Lab Results  Component Value Date   INR 0.99 05/27/2012    Assessment/Plan: POD #1 s/p Procedure(s): REVERSE SHOULDER ARTHROPLASTY Looks great this morning. Went over sling use and exercises. Recommended not going to the gym until I see him back in two weeks in the office, home exercises only   Almedia Balls. Ranell Patrick, MD 05/31/2012 8:09 AM

## 2012-05-31 NOTE — Evaluation (Signed)
Occupational Therapy Evaluation Patient Details Name: Mark Terry MRN: [id number] DOB: 1935-04-09 Today's Date: 05/31/2012 Time: 8469-6295 OT Time Calculation (min): 42 min  OT Assessment / Plan / Recommendation Clinical Impression  This 77 y.o. male admitted for Rt. reverse TSA.  All instruction provided.  Pt. will have support from g-friend at discharge.  No further OT needs identified    OT Assessment  Patient does not need any further OT services    Follow Up Recommendations  Supervision/Assistance - 24 hour;Supervision - Intermittent    Barriers to Discharge      Equipment Recommendations  None recommended by OT    Recommendations for Other Services    Frequency       Precautions / Restrictions Precautions Precautions: Shoulder Type of Shoulder Precautions: per Dr. Ranell Patrick reverse TSA protoccol Precaution Booklet Issued: Yes (comment) Precaution Comments: shoulder handout provided with changes to reflect Dr. Ranell Patrick' protoccol (Pt instructed to avoid abduction) Required Braces or Orthoses: Other Brace/Splint (sling for comfort) Restrictions Weight Bearing Restrictions: Yes RUE Weight Bearing: Non weight bearing       ADL       OT Diagnosis:    OT Problem List:   OT Treatment Interventions:     OT Goals    Visit Information  Last OT Received On: 05/31/12    Subjective Data  Subjective: "My girlfriend will help me at home" Patient Stated Goal: To go home   Prior Functioning     Home Living Lives With: Significant other Available Help at Discharge: Friend(s);Available 24 hours/day Type of Home: House Home Access: Stairs to enter [employer] of Steps: 2 Entrance Stairs-Rails: Right;Left;Can reach both Home Layout: One level Bathroom Shower/Tub: Health visitor: Standard Home Adaptive Equipment: Straight cane;Shower chair without back Prior Function Level of Independence: Needs assistance Needs Assistance:  Dressing Dressing: Minimal (with socks) Able to Take Stairs?: Yes Driving: Yes Vocation: Retired Musician: No difficulties Dominant Hand: Left         Vision/Perception     Cognition  Overall Cognitive Status: Appears within functional limits for tasks assessed/performed Arousal/Alertness: Awake/alert Orientation Level: Appears intact for tasks assessed Behavior During Session: [facility] for tasks performed    Extremity/Trunk Assessment Right Upper Extremity Assessment RUE ROM/Strength/Tone: Deficits;Due to precautions (due to reverse TSA) RUE ROM/Strength/Tone Deficits: Elbow AROM limited ~50% flexion and supination to neutral RUE Coordination: Deficits RUE Coordination Deficits: due to surgery Left Upper Extremity Assessment LUE ROM/Strength/Tone: Deficits LUE ROM/Strength/Tone Deficits: history of shoulder injury LUE Coordination: WFL - gross/fine motor     Mobility Transfers Transfers: Sit to Stand;Stand to Sit Sit to Stand: 6: Modified independent (Device/Increase time) Stand to Sit: 6: Modified independent (Device/Increase time) Details for Transfer Assistance: Pt. reports that he uses SPC at home     Shoulder Instructions Donning/doffing shirt without moving shoulder: Patient able to independently direct caregiver (able to use Rt. UE for ADLs) Method for sponge bathing under operated UE: Patient able to independently direct caregiver Donning/doffing sling/immobilizer: Patient able to independently direct caregiver Correct positioning of sling/immobilizer: Patient able to independently direct caregiver ROM for elbow, wrist and digits of operated UE: Independent Sling wearing schedule (on at all times/off for ADL's): Independent (sling for comfort) Proper positioning of operated UE when showering: Independent Positioning of UE while sleeping: Independent   Exercise Shoulder Exercises Elbow Flexion: AROM;AAROM;20 reps;Standing Elbow Extension:  AROM;20 reps;Standing Wrist Flexion: AROM;10 reps;Seated;Right Wrist Extension: AROM;10 reps;Right;Seated Digit Composite Flexion: AROM;10 reps;Seated Composite Extension: AROM;Right;10 reps;Seated Neck Flexion:  AROM;5 reps;Standing Neck Extension: AROM;10 reps;Seated   Balance Balance Balance Assessed: Yes Dynamic Standing Balance Dynamic Standing - Balance Support: No upper extremity supported Dynamic Standing - Level of Assistance: 6: Modified independent (Device/Increase time) Dynamic Standing - Balance Activities: Other (comment) (ADLs)   End of Session OT - End of Session Activity Tolerance: Patient tolerated treatment well Patient left: in chair;with family/visitor present Nurse Communication: Other (comment) (Pt ready for discharge)  GO     Mark Terry M 05/31/2012, 2:16 PM

## 2012-06-02 ENCOUNTER — Encounter (HOSPITAL_COMMUNITY): Payer: Self-pay | Admitting: Orthopedic Surgery

## 2012-10-01 ENCOUNTER — Ambulatory Visit: Payer: Self-pay | Admitting: Family Medicine

## 2013-06-03 ENCOUNTER — Ambulatory Visit: Payer: Self-pay | Admitting: Unknown Physician Specialty

## 2013-11-16 ENCOUNTER — Ambulatory Visit: Payer: Self-pay | Admitting: Family Medicine

## 2013-12-21 DIAGNOSIS — M47816 Spondylosis without myelopathy or radiculopathy, lumbar region: Secondary | ICD-10-CM | POA: Insufficient documentation

## 2013-12-21 DIAGNOSIS — M5136 Other intervertebral disc degeneration, lumbar region: Secondary | ICD-10-CM | POA: Insufficient documentation

## 2013-12-21 DIAGNOSIS — M5116 Intervertebral disc disorders with radiculopathy, lumbar region: Secondary | ICD-10-CM | POA: Insufficient documentation

## 2014-01-01 ENCOUNTER — Ambulatory Visit: Payer: Self-pay | Admitting: Physical Medicine and Rehabilitation

## 2014-04-01 DIAGNOSIS — M5412 Radiculopathy, cervical region: Secondary | ICD-10-CM | POA: Insufficient documentation

## 2014-04-09 ENCOUNTER — Ambulatory Visit: Payer: Self-pay | Admitting: Physical Medicine and Rehabilitation

## 2014-04-28 DIAGNOSIS — M4802 Spinal stenosis, cervical region: Secondary | ICD-10-CM | POA: Insufficient documentation

## 2014-04-28 DIAGNOSIS — M5030 Other cervical disc degeneration, unspecified cervical region: Secondary | ICD-10-CM | POA: Insufficient documentation

## 2014-08-11 DIAGNOSIS — M4802 Spinal stenosis, cervical region: Secondary | ICD-10-CM | POA: Diagnosis not present

## 2014-08-11 DIAGNOSIS — M5412 Radiculopathy, cervical region: Secondary | ICD-10-CM | POA: Diagnosis not present

## 2014-08-11 DIAGNOSIS — M5030 Other cervical disc degeneration, unspecified cervical region: Secondary | ICD-10-CM | POA: Diagnosis not present

## 2014-08-30 DIAGNOSIS — M19071 Primary osteoarthritis, right ankle and foot: Secondary | ICD-10-CM | POA: Diagnosis not present

## 2014-08-31 DIAGNOSIS — Z08 Encounter for follow-up examination after completed treatment for malignant neoplasm: Secondary | ICD-10-CM | POA: Diagnosis not present

## 2014-08-31 DIAGNOSIS — Z808 Family history of malignant neoplasm of other organs or systems: Secondary | ICD-10-CM | POA: Diagnosis not present

## 2014-08-31 DIAGNOSIS — Z1283 Encounter for screening for malignant neoplasm of skin: Secondary | ICD-10-CM | POA: Diagnosis not present

## 2014-08-31 DIAGNOSIS — Z85820 Personal history of malignant melanoma of skin: Secondary | ICD-10-CM | POA: Diagnosis not present

## 2014-08-31 DIAGNOSIS — L570 Actinic keratosis: Secondary | ICD-10-CM | POA: Diagnosis not present

## 2014-08-31 DIAGNOSIS — G4733 Obstructive sleep apnea (adult) (pediatric): Secondary | ICD-10-CM | POA: Diagnosis not present

## 2014-08-31 DIAGNOSIS — L408 Other psoriasis: Secondary | ICD-10-CM | POA: Diagnosis not present

## 2014-09-03 DIAGNOSIS — G4733 Obstructive sleep apnea (adult) (pediatric): Secondary | ICD-10-CM | POA: Diagnosis not present

## 2014-09-13 DIAGNOSIS — E119 Type 2 diabetes mellitus without complications: Secondary | ICD-10-CM | POA: Diagnosis not present

## 2014-09-13 DIAGNOSIS — I10 Essential (primary) hypertension: Secondary | ICD-10-CM | POA: Diagnosis not present

## 2014-09-13 DIAGNOSIS — E785 Hyperlipidemia, unspecified: Secondary | ICD-10-CM | POA: Diagnosis not present

## 2014-09-29 DIAGNOSIS — M5416 Radiculopathy, lumbar region: Secondary | ICD-10-CM | POA: Diagnosis not present

## 2014-09-29 DIAGNOSIS — M5136 Other intervertebral disc degeneration, lumbar region: Secondary | ICD-10-CM | POA: Diagnosis not present

## 2014-10-06 DIAGNOSIS — E119 Type 2 diabetes mellitus without complications: Secondary | ICD-10-CM | POA: Diagnosis not present

## 2014-10-06 DIAGNOSIS — I209 Angina pectoris, unspecified: Secondary | ICD-10-CM | POA: Diagnosis not present

## 2014-10-06 DIAGNOSIS — E784 Other hyperlipidemia: Secondary | ICD-10-CM | POA: Diagnosis not present

## 2014-10-06 DIAGNOSIS — I5032 Chronic diastolic (congestive) heart failure: Secondary | ICD-10-CM | POA: Diagnosis not present

## 2014-11-10 DIAGNOSIS — M5412 Radiculopathy, cervical region: Secondary | ICD-10-CM | POA: Diagnosis not present

## 2014-11-10 DIAGNOSIS — M5416 Radiculopathy, lumbar region: Secondary | ICD-10-CM | POA: Diagnosis not present

## 2014-11-10 DIAGNOSIS — M5030 Other cervical disc degeneration, unspecified cervical region: Secondary | ICD-10-CM | POA: Diagnosis not present

## 2014-11-10 DIAGNOSIS — M5136 Other intervertebral disc degeneration, lumbar region: Secondary | ICD-10-CM | POA: Diagnosis not present

## 2014-11-16 DIAGNOSIS — R42 Dizziness and giddiness: Secondary | ICD-10-CM | POA: Diagnosis not present

## 2014-11-16 DIAGNOSIS — H903 Sensorineural hearing loss, bilateral: Secondary | ICD-10-CM | POA: Diagnosis not present

## 2014-11-22 DIAGNOSIS — E1142 Type 2 diabetes mellitus with diabetic polyneuropathy: Secondary | ICD-10-CM | POA: Diagnosis not present

## 2014-11-22 DIAGNOSIS — M19071 Primary osteoarthritis, right ankle and foot: Secondary | ICD-10-CM | POA: Diagnosis not present

## 2014-11-26 DIAGNOSIS — Z0000 Encounter for general adult medical examination without abnormal findings: Secondary | ICD-10-CM | POA: Diagnosis not present

## 2014-11-26 DIAGNOSIS — I10 Essential (primary) hypertension: Secondary | ICD-10-CM | POA: Diagnosis not present

## 2014-11-26 DIAGNOSIS — I2510 Atherosclerotic heart disease of native coronary artery without angina pectoris: Secondary | ICD-10-CM | POA: Diagnosis not present

## 2014-11-26 DIAGNOSIS — E119 Type 2 diabetes mellitus without complications: Secondary | ICD-10-CM | POA: Diagnosis not present

## 2014-11-29 DIAGNOSIS — E119 Type 2 diabetes mellitus without complications: Secondary | ICD-10-CM | POA: Diagnosis not present

## 2014-11-29 DIAGNOSIS — E785 Hyperlipidemia, unspecified: Secondary | ICD-10-CM | POA: Diagnosis not present

## 2014-11-29 DIAGNOSIS — K219 Gastro-esophageal reflux disease without esophagitis: Secondary | ICD-10-CM | POA: Diagnosis not present

## 2014-11-29 DIAGNOSIS — I10 Essential (primary) hypertension: Secondary | ICD-10-CM | POA: Diagnosis not present

## 2014-12-01 DIAGNOSIS — M4802 Spinal stenosis, cervical region: Secondary | ICD-10-CM | POA: Diagnosis not present

## 2014-12-01 DIAGNOSIS — M5412 Radiculopathy, cervical region: Secondary | ICD-10-CM | POA: Diagnosis not present

## 2014-12-01 DIAGNOSIS — M5030 Other cervical disc degeneration, unspecified cervical region: Secondary | ICD-10-CM | POA: Diagnosis not present

## 2014-12-08 ENCOUNTER — Telehealth: Payer: Self-pay | Admitting: Family Medicine

## 2014-12-08 NOTE — Telephone Encounter (Signed)
[place] [facility]

## 2014-12-14 ENCOUNTER — Encounter: Payer: Self-pay | Admitting: Physical Therapy

## 2014-12-14 ENCOUNTER — Ambulatory Visit: Payer: Medicare Other | Attending: Family Medicine | Admitting: Physical Therapy

## 2014-12-14 DIAGNOSIS — R262 Difficulty in walking, not elsewhere classified: Secondary | ICD-10-CM

## 2014-12-14 DIAGNOSIS — R531 Weakness: Secondary | ICD-10-CM | POA: Diagnosis not present

## 2014-12-14 NOTE — Therapy (Addendum)
[place] MAIN [facility] SERVICES [address] [place], [place], [address] Phone: [phone]   Fax:  [phone]  Physical Therapy Evaluation  Patient Details  Name: Mark Terry. MRN: [id number] Date of Birth: 1934-10-08 Referring Provider:  Birdie Sons, MD  Encounter Date: 12/14/2014    Past Medical History  Diagnosis Date  . PONV (postoperative nausea and vomiting)   . Hyperlipidemia     takes Simvastin daily  . Coronary artery disease   . COPD (chronic obstructive pulmonary disease)   . Emphysema   . Shortness of breath     with exertion  . Sleep apnea     uses CPAP  . History of bronchitis     last time a couple of yrs ago  . Pneumonia     hx of last time a couple of yrs ago  . Arthritis   . Joint pain   . Joint swelling   . Chronic back pain     stenosis  . Bruises easily   . Melanoma   . H/O hiatal hernia   . GERD (gastroesophageal reflux disease)     takes Omeprazole daily  . History of gastric ulcer   . History of colon polyps   . H/O esophagogastroduodenoscopy   . Hypertension     takes Metoprolol daily and Cardura nightly  . Peripheral edema     takes Lasix daily  . Leg cramps     takes quinine prn  . Diabetes mellitus without complication     takes Glimepiride daily  . History of blood transfusion     no reaction noted  . Insomnia     takes Trazodone nightly  . History of MRSA infection     Past Surgical History  Procedure Laterality Date  . Cholecystectomy  1970  . Back surgery      x 4, last lumb. laminectomy Dr. Annette Stable 2008  . Replacement total knee bilateral  '86 and 2000  . Total hip arthroplasty Right 2012    [facility]  . Neck surgery      x 2  . Coronary artery bypass graft  2000     3 vessels  . Colonoscopy    . Bilateral cataract surgery    . Reverse shoulder arthroplasty  05/30/2012    Procedure: REVERSE SHOULDER ARTHROPLASTY;  Surgeon: Augustin Schooling, MD;  Location: [place];   Service: Orthopedics;  Laterality: Right;  right reverse shoulder arthroplasty    There were no vitals filed for this visit.  Visit Diagnosis:  Weakness - Plan: PT plan of care cert/re-cert  Difficulty in walking - Plan: PT plan of care cert/re-cert           left hip flex 4/5  hip ER 4/5  hip IR 4/5  hip abd 4/5  Right hip flex4/5 hip ER 4/5 hip IR4/5hip abd 4/5                             PT Long Term Goals - 12/23/14 1645    PT LONG TERM GOAL #1   Title Patient (< 79 years old) will complete five times sit to stand test in < 10 seconds indicating an increased LE strength and improved balance   Status Partially Met   PT LONG TERM GOAL #2   Title Patient will complete a TUG test in < 12 seconds without an AD for independent mobility and  decreased fall risk.    Status On-going   PT LONG TERM GOAL #3   Title Patient will be independent with home exercise program for self-management of ankle symptoms/ difficulty.   Status Partially Met   PT LONG TERM GOAL #4   Title Pt will increase 6 minute walk time by 150 feet to improve endurance and aid in improving mobility   Status On-going                Problem List Patient Active Problem List   Diagnosis Date Noted  . Allergic rhinitis 12/20/2014  . Angina pectoris 12/20/2014  . Arthritis 12/20/2014  . At risk for falling 12/20/2014  . Back pain with radiation 12/20/2014  . BPH (benign prostatic hyperplasia) 12/20/2014  . Carpal tunnel syndrome 12/20/2014  . Congestive heart failure 12/20/2014  . Fatty infiltration of liver 12/20/2014  . History of colonic polyps 12/20/2014  . Decreased potassium in the blood 12/20/2014  . Cervical pain 12/20/2014  . Skin lesion 12/20/2014  . Change in blood platelet count 12/20/2014  . Arthritis of ankle, right 12/20/2014  . Varus foot deformity, acquired 12/20/2014  . Insomnia   . Cervical spinal stenosis 04/28/2014  . DDD (degenerative disc disease),  cervical 04/28/2014  . Cervical radiculitis 04/01/2014  . Neuritis or radiculitis due to rupture of lumbar intervertebral disc 12/21/2013  . Chronic gastritis 12/21/2010  . Pilonidal cyst 09/05/2009  . Obstructive apnea 05/07/2005  . Diabetes mellitus, type 2 04/22/2003  . Atherosclerosis of coronary artery 10/05/1999  . Essential (primary) hypertension 09/06/1998  . Arthropathia 03/25/1996  . HLD (hyperlipidemia) 03/25/1996  . Extreme obesity 09/21/1994  . GERD (gastroesophageal reflux disease) 08/10/1994  Kristine S Mansfield, PT, DPT  [place], Kristine S 01/04/2015, 10:44 AM  [place]  [facility] [address] [place], [place], [address] Phone: [phone]   Fax:  [phone]    

## 2014-12-20 ENCOUNTER — Ambulatory Visit (INDEPENDENT_AMBULATORY_CARE_PROVIDER_SITE_OTHER): Payer: Medicare Other | Admitting: Family Medicine

## 2014-12-20 ENCOUNTER — Encounter: Payer: Self-pay | Admitting: Family Medicine

## 2014-12-20 VITALS — BP 108/60 | HR 100 | Temp 98.2°F | Resp 18 | Ht 61.0 in | Wt 232.0 lb

## 2014-12-20 DIAGNOSIS — I509 Heart failure, unspecified: Secondary | ICD-10-CM | POA: Insufficient documentation

## 2014-12-20 DIAGNOSIS — M21171 Varus deformity, not elsewhere classified, right ankle: Secondary | ICD-10-CM

## 2014-12-20 DIAGNOSIS — M1990 Unspecified osteoarthritis, unspecified site: Secondary | ICD-10-CM | POA: Insufficient documentation

## 2014-12-20 DIAGNOSIS — M549 Dorsalgia, unspecified: Secondary | ICD-10-CM | POA: Insufficient documentation

## 2014-12-20 DIAGNOSIS — Z860100 Personal history of colon polyps, unspecified: Secondary | ICD-10-CM | POA: Insufficient documentation

## 2014-12-20 DIAGNOSIS — J309 Allergic rhinitis, unspecified: Secondary | ICD-10-CM | POA: Insufficient documentation

## 2014-12-20 DIAGNOSIS — IMO0002 Reserved for concepts with insufficient information to code with codable children: Secondary | ICD-10-CM

## 2014-12-20 DIAGNOSIS — M129 Arthropathy, unspecified: Secondary | ICD-10-CM | POA: Diagnosis not present

## 2014-12-20 DIAGNOSIS — E876 Hypokalemia: Secondary | ICD-10-CM | POA: Insufficient documentation

## 2014-12-20 DIAGNOSIS — I209 Angina pectoris, unspecified: Secondary | ICD-10-CM | POA: Insufficient documentation

## 2014-12-20 DIAGNOSIS — E119 Type 2 diabetes mellitus without complications: Secondary | ICD-10-CM | POA: Diagnosis not present

## 2014-12-20 DIAGNOSIS — D696 Thrombocytopenia, unspecified: Secondary | ICD-10-CM | POA: Insufficient documentation

## 2014-12-20 DIAGNOSIS — G5600 Carpal tunnel syndrome, unspecified upper limb: Secondary | ICD-10-CM | POA: Insufficient documentation

## 2014-12-20 DIAGNOSIS — M19071 Primary osteoarthritis, right ankle and foot: Secondary | ICD-10-CM

## 2014-12-20 DIAGNOSIS — K760 Fatty (change of) liver, not elsewhere classified: Secondary | ICD-10-CM | POA: Insufficient documentation

## 2014-12-20 DIAGNOSIS — G4700 Insomnia, unspecified: Secondary | ICD-10-CM | POA: Insufficient documentation

## 2014-12-20 DIAGNOSIS — N400 Enlarged prostate without lower urinary tract symptoms: Secondary | ICD-10-CM | POA: Insufficient documentation

## 2014-12-20 DIAGNOSIS — M21961 Unspecified acquired deformity of right lower leg: Secondary | ICD-10-CM

## 2014-12-20 DIAGNOSIS — Z9181 History of falling: Secondary | ICD-10-CM | POA: Insufficient documentation

## 2014-12-20 DIAGNOSIS — M542 Cervicalgia: Secondary | ICD-10-CM | POA: Insufficient documentation

## 2014-12-20 DIAGNOSIS — L989 Disorder of the skin and subcutaneous tissue, unspecified: Secondary | ICD-10-CM | POA: Insufficient documentation

## 2014-12-20 DIAGNOSIS — M21179 Varus deformity, not elsewhere classified, unspecified ankle: Secondary | ICD-10-CM | POA: Insufficient documentation

## 2014-12-20 NOTE — Progress Notes (Signed)
Patient: Mark Terry. Male    DOB: [birth year]   80 y.o.   MRN: [id number] Visit Date: 12/20/2014  Today's Provider: [name], MD   Chief Complaint  Patient presents with  . Diabetes    Needs Diabetic shoes   Subjective:    HPI   Diabetes Mellitus Type II, Follow-up:   Patient is here today for diabetic foot exam.  Last HGBA1C: 11/29/2014  7.6  Last seen for diabetes 1 months ago.  Management changes included  Starting Metformin ER 500mg  daily. He reports good compliance with treatment. He is not having side effects.  Current symptoms include numbness in feet and have been unchanged. Home blood sugar records: 130-140 fasting (improved since starting metformin) Episodes of hypoglycemia? none  Current Insulin Regimen: none  Most Recent Eye Exam: <1 year Weight trend: stable Prior visit with dietician: no Current diet: in general, an "unhealthy" diet Current exercise:  Exercises 30 minutes daily at the gym  Pertinent Labs:    Component Value Date/Time   CREATININE 1.22 05/31/2012 0500    Wt Readings from Last 3 Encounters:  05/27/12 241 lb 10 oz (109.6 kg)    ------------------------------------------------------------------------  Foot pain  Patient has a long history of severe pain in his right ankle getting progressively worse. He had been followed by Wylene Simmer, MD [place] and prescribed [facility], but he trouble wearing it. He is trying to get fitted for another brace through [facility] and advised that he should get Diabetic Shoes. He has no history of neuropathy, peripheral vascular disease, ulcers, or pre-ulcerative calluses.      Previous Medications   AMOXICILLIN (AMOXIL) 500 MG CAPSULE    Take 500 mg by mouth See admin instructions. Take prior to dental procedure   ASPIRIN 81 MG TABLET    Take 81 mg by mouth daily.   DOXAZOSIN (CARDURA) 8 MG TABLET    Take 8 mg by mouth at bedtime.   FLUTICASONE (FLONASE) 50  MCG/ACT NASAL SPRAY    Place 2 sprays into the nose daily.   FUROSEMIDE (LASIX) 40 MG TABLET    Take 40 mg by mouth daily.   GABAPENTIN (NEURONTIN) 300 MG CAPSULE    Take 300 mg by mouth 3 (three) times daily.   GLIMEPIRIDE (AMARYL) 1 MG TABLET    Take 1 mg by mouth daily before breakfast.   LORATADINE (CLARITIN) 10 MG TABLET    Take 10 mg by mouth daily.   MELOXICAM (MOBIC) 15 MG TABLET    Take 15 mg by mouth daily.   METHOCARBAMOL (ROBAXIN) 500 MG TABLET    Take 1 tablet (500 mg total) by mouth 3 (three) times daily as needed.   METOPROLOL TARTRATE (LOPRESSOR) 25 MG TABLET    Take 25 mg by mouth 2 (two) times daily.   MULTIPLE VITAMIN (MULTIVITAMIN WITH MINERALS) TABS    Take 1 tablet by mouth daily.   OMEPRAZOLE (PRILOSEC) 20 MG CAPSULE    Take 20 mg by mouth daily.   OXYCODONE-ACETAMINOPHEN (ROXICET) 5-325 MG PER TABLET    Take 1-2 tablets by mouth every 4 (four) hours as needed for pain.   POTASSIUM CHLORIDE SA (K-DUR,KLOR-CON) 20 MEQ TABLET    Take 20 mEq by mouth daily.   QUININE (QUALAQUIN) 324 MG CAPSULE    Take 648 mg by mouth every 8 (eight) hours. For leg cramps   SIMVASTATIN (ZOCOR) 40 MG TABLET    Take 40 mg by mouth every evening.  TRAZODONE (DESYREL) 50 MG TABLET    Take 50 mg by mouth at bedtime.    Review of Systems  Neurological: Numbness: in feet.    History  Substance Use Topics  . Smoking status: Former Smoker -- 1.00 packs/day for 25 years    Types: Cigarettes    Quit date: 07/09/1968  . Smokeless tobacco: Not on file     Comment: quit at age 23  . Alcohol Use: No   Objective:   BP 108/60 mmHg  Pulse 100  Temp(Src) 98.2 F (36.8 C) (Oral)  Resp 18  Ht 5\' 1"  (1.549 m)  Wt 232 lb (105.235 kg)  BMI 43.86 kg/m2  SpO2 96%  Physical Exam  General Appearance:    Alert, cooperative, no distress  Eyes:    PERRL, conjunctiva/corneas clear, EOM's intact       Lungs:     Clear to auscultation bilaterally, respirations unlabored  Heart:    Regular rate and  rhythm  Neurologic:   Awake, alert, oriented x 3. No apparent focal neurological           defect. Normal s/s both feet  Derm:   Significant onychomycosis both feet L>R  MS:   Right ankle moderately tender laterally with varus deformity. Minimal swelling  Vascular:   +2 pedal pulses        Assessment & Plan:     1. Arthritis of ankle, right Severe, in process of obtaining brace as per orthopedics.   2. Type 2 diabetes mellitus without complication Doing well with addition of metformin. Follow up 2-3 months as scheduled.   3. Varus foot deformity, acquired, right Would likely benefit from diabetic shoes.

## 2014-12-21 ENCOUNTER — Ambulatory Visit: Payer: Medicare Other | Attending: Family Medicine | Admitting: Physical Therapy

## 2014-12-21 DIAGNOSIS — R262 Difficulty in walking, not elsewhere classified: Secondary | ICD-10-CM | POA: Insufficient documentation

## 2014-12-21 DIAGNOSIS — R531 Weakness: Secondary | ICD-10-CM | POA: Diagnosis not present

## 2014-12-21 DIAGNOSIS — R296 Repeated falls: Secondary | ICD-10-CM | POA: Insufficient documentation

## 2014-12-21 NOTE — Therapy (Signed)
[place] MAIN [facility] SERVICES [address] [place], [place], [address] Phone: [phone]   Fax:  [phone]  Physical Therapy Treatment  Patient Details  Name: Mark Terry. MRN: [id number] Date of Birth: [birth year] Referring Provider:  Birdie Sons, MD  Encounter Date: 12/21/2014      PT End of Session - 12/21/14 1608    Visit Number 2   Number of Visits 13   Date for PT Re-Evaluation 01/25/15   Authorization Type 2/10   PT Start Time 1507   PT Stop Time 1548   PT Time Calculation (min) 41 min   Equipment Utilized During Treatment Gait belt   Activity Tolerance Patient tolerated treatment well;Patient limited by fatigue;Patient limited by pain   Behavior During Therapy [facility] for tasks assessed/performed      Past Medical History  Diagnosis Date  . PONV (postoperative nausea and vomiting)   . Hyperlipidemia     takes Simvastin daily  . Coronary artery disease   . COPD (chronic obstructive pulmonary disease)   . Emphysema   . Shortness of breath     with exertion  . Sleep apnea     uses CPAP  . History of bronchitis     last time a couple of yrs ago  . Pneumonia     hx of last time a couple of yrs ago  . Arthritis   . Joint pain   . Joint swelling   . Chronic back pain     stenosis  . Bruises easily   . Melanoma   . H/O hiatal hernia   . GERD (gastroesophageal reflux disease)     takes Omeprazole daily  . History of gastric ulcer   . History of colon polyps   . H/O esophagogastroduodenoscopy   . Hypertension     takes Metoprolol daily and Cardura nightly  . Peripheral edema     takes Lasix daily  . Leg cramps     takes quinine prn  . Diabetes mellitus without complication     takes Glimepiride daily  . History of blood transfusion     no reaction noted  . Insomnia     takes Trazodone nightly  . History of MRSA infection     Past Surgical History  Procedure Laterality Date  .  Cholecystectomy  1970  . Back surgery      x 4, last lumb. laminectomy Dr. Annette Stable 2008  . Replacement total knee bilateral  '86 and 2000  . Total hip arthroplasty Right 2012    [facility]  . Neck surgery      x 2  . Coronary artery bypass graft  2000     3 vessels  . Colonoscopy    . Bilateral cataract surgery    . Reverse shoulder arthroplasty  05/30/2012    Procedure: REVERSE SHOULDER ARTHROPLASTY;  Surgeon: Augustin Schooling, MD;  Location: [place];  Service: Orthopedics;  Laterality: Right;  right reverse shoulder arthroplasty    There were no vitals filed for this visit.  Visit Diagnosis:  Weakness  Difficulty in walking      Subjective Assessment - 12/21/14 1506    Subjective Patient reports he has "5 bulging discs" in his back and will be getting a cortisone injection tomorrow. Patient denies any new falls.    Limitations Standing;Walking  Laying down, RLE goes in to spasms   Currently in Pain? Yes   Pain Score 8    Pain  Location Back   Pain Orientation Lower   Pain Onset More than a month ago   Pain Relieving Factors Pain medications       There Ex  Standing gluteal sets x10 which helped to relieve symptoms  Standing hip flexion marching x10 bilaterally  Standing hip abductions x 5 bilaterally, increased low back pain symptoms  Standing lumbar extensions x 12 on foam roller relieved LBP Standing lumbar extensions x 10 with manual feedback relieved LBP  Sit to stand x 5 for 2 sets with cuing for maintaining erect trunk  Tandem walking/stance x 10 seconds bilaterally for 5 repetitions. Patient began to become uncomfortable and had trouble maintaining his balance requiring holding on to // bars.                            PT Education - 12/21/14 1607    Education provided Yes   Education Details Patient provided HEP and educated that LBP is often tied to altered neuromuscular control and LE weakness, particularly hip extensors.    Person(s) Educated  Patient   Methods Explanation;Demonstration;Handout   Comprehension Verbalized understanding;Returned demonstration;Verbal cues required;Tactile cues required             PT Long Term Goals - 12/14/14 1121    PT LONG TERM GOAL #1   Title Patient (< 25 years old) will complete five times sit to stand test in < 10 seconds indicating an increased LE strength and improved balance   Status New   PT LONG TERM GOAL #2   Title Patient will complete a TUG test in < 12 seconds without an AD for independent mobility and decreased fall risk.    Status New   PT LONG TERM GOAL #3   Title Patient will be independent with home exercise program for self-management of ankle symptoms/ difficulty.   Status New   PT LONG TERM GOAL #4   Title Pt will increase 6 minute walk time by 150 feet to improve endurance and aid in improving mobility   Status New               Plan - 12/21/14 1609    Clinical Impression Statement Patient presents today with significant lower back pain that is limiting his progress with balance exercises and activities. Patient reported a decrease from 8 to 6 with exercises targeted at reducing his lower back symptoms. Patient displays very poor hip extension range of motion and decreased LE strength.. Skilled PT services are indicated at this time to address the listed deficits.    Pt will benefit from skilled therapeutic intervention in order to improve on the following deficits Abnormal gait;Decreased endurance;Impaired sensation;Decreased strength;Pain;Decreased mobility;Difficulty walking;Decreased range of motion   Rehab Potential Fair   PT Frequency 2x / week   PT Duration 6 weeks   PT Treatment/Interventions Ultrasound;Gait training;Therapeutic exercise;Therapeutic activities;Balance training   PT Next Visit Plan Re-assess lower back pain and HEP. Progress strengthening, stretching, and balance activities as tolerated.    PT Home Exercise Plan See patient  instructions.    Consulted and Agree with Plan of Care Patient        Problem List Patient Active Problem List   Diagnosis Date Noted  . Allergic rhinitis 12/20/2014  . Angina pectoris 12/20/2014  . Arthritis 12/20/2014  . At risk for falling 12/20/2014  . Back pain with radiation 12/20/2014  . BPH (benign prostatic hyperplasia) 12/20/2014  . Carpal tunnel syndrome  12/20/2014  . Congestive heart failure 12/20/2014  . Fatty infiltration of liver 12/20/2014  . History of colonic polyps 12/20/2014  . Decreased potassium in the blood 12/20/2014  . Cervical pain 12/20/2014  . Skin lesion 12/20/2014  . Change in blood platelet count 12/20/2014  . Arthritis of ankle, right 12/20/2014  . Varus foot deformity, acquired 12/20/2014  . Insomnia   . Cervical spinal stenosis 04/28/2014  . DDD (degenerative disc disease), cervical 04/28/2014  . Cervical radiculitis 04/01/2014  . Neuritis or radiculitis due to rupture of lumbar intervertebral disc 12/21/2013  . Chronic gastritis 12/21/2010  . Pilonidal cyst 09/05/2009  . Obstructive apnea 05/07/2005  . Diabetes mellitus, type 2 04/22/2003  . Atherosclerosis of coronary artery 10/05/1999  . Essential (primary) hypertension 09/06/1998  . Arthropathia 03/25/1996  . HLD (hyperlipidemia) 03/25/1996  . Extreme obesity 09/21/1994  . GERD (gastroesophageal reflux disease) 08/10/1994    Kerman Passey, PT, DPT   12/21/2014, 4:28 PM  [place] MAIN [facility] SERVICES [address] [place], [place], [address] Phone: [phone]   Fax:  [phone]

## 2014-12-21 NOTE — Patient Instructions (Addendum)
All exercises provided were adapted from [email or web address]. Patient was provided a written handout with pictures as described. Any additional cues were manually entered in to handout and copied in to this document.  Glute set (10 times, 3 second hold 2 set, 2x per day)  Lie on your back with your legs straight.  Tighten your gluteal (rear end) muscles by squeezing together.    ** squeeze your buttcheek muscles like you were holding back a fart. Can do this in standing   SIT TO STAND - NO HANDS (8 times, 1 second hold 2 set, 1x per day)  Start by sitting in a chair. Next, raise up to standing without using your hands for support.  ** Push through your heels and keep your chest as high as possible.   Standing Lumbar Spine Extension (8 times, 5 second hold 1 set, 2x per day)  Stand with support in front of you. Arch your low back without pain.    ** Make sure you are holding on to something.

## 2014-12-23 ENCOUNTER — Ambulatory Visit: Payer: Medicare Other | Admitting: Physical Therapy

## 2014-12-23 ENCOUNTER — Encounter: Payer: Self-pay | Admitting: Family Medicine

## 2014-12-23 ENCOUNTER — Encounter: Payer: Self-pay | Admitting: Physical Therapy

## 2014-12-23 DIAGNOSIS — R296 Repeated falls: Secondary | ICD-10-CM | POA: Diagnosis not present

## 2014-12-23 DIAGNOSIS — R262 Difficulty in walking, not elsewhere classified: Secondary | ICD-10-CM

## 2014-12-23 DIAGNOSIS — R531 Weakness: Secondary | ICD-10-CM

## 2014-12-24 NOTE — Therapy (Signed)
[place] MAIN [facility] SERVICES [address] [place], [place], [address] Phone: [phone]   Fax:  [phone]  Physical Therapy Treatment  Patient Details  Name: Mark Terry. MRN: [id number] Date of Birth: 1935-04-18 Referring Provider:  Birdie Sons, MD  Encounter Date: 12/23/2014      PT End of Session - 12/23/14 1644    Visit Number 3   Number of Visits 13   Date for PT Re-Evaluation 01/25/15   Authorization Type 3/10   PT Start Time 9242   PT Stop Time 1631   PT Time Calculation (min) 43 min   Equipment Utilized During Treatment Gait belt   Activity Tolerance Patient tolerated treatment well;Patient limited by fatigue;Patient limited by pain   Behavior During Therapy [facility] for tasks assessed/performed      Past Medical History  Diagnosis Date  . PONV (postoperative nausea and vomiting)   . Hyperlipidemia     takes Simvastin daily  . Coronary artery disease   . COPD (chronic obstructive pulmonary disease)   . Emphysema   . Shortness of breath     with exertion  . Sleep apnea     uses CPAP  . History of bronchitis     last time a couple of yrs ago  . Pneumonia     hx of last time a couple of yrs ago  . Arthritis   . Joint pain   . Joint swelling   . Chronic back pain     stenosis  . Bruises easily   . Melanoma   . H/O hiatal hernia   . GERD (gastroesophageal reflux disease)     takes Omeprazole daily  . History of gastric ulcer   . History of colon polyps   . H/O esophagogastroduodenoscopy   . Hypertension     takes Metoprolol daily and Cardura nightly  . Peripheral edema     takes Lasix daily  . Leg cramps     takes quinine prn  . Diabetes mellitus without complication     takes Glimepiride daily  . History of blood transfusion     no reaction noted  . Insomnia     takes Trazodone nightly  . History of MRSA infection     Past Surgical History  Procedure Laterality Date  .  Cholecystectomy  1970  . Back surgery      x 4, last lumb. laminectomy Dr. Annette Stable 2008  . Replacement total knee bilateral  '86 and 2000  . Total hip arthroplasty Right 2012    [facility]  . Neck surgery      x 2  . Coronary artery bypass graft  2000     3 vessels  . Colonoscopy    . Bilateral cataract surgery    . Reverse shoulder arthroplasty  05/30/2012    Procedure: REVERSE SHOULDER ARTHROPLASTY;  Surgeon: Augustin Schooling, MD;  Location: [place];  Service: Orthopedics;  Laterality: Right;  right reverse shoulder arthroplasty    There were no vitals filed for this visit.  Visit Diagnosis:  Weakness  Difficulty in walking      Subjective Assessment - 12/23/14 1551    Subjective Patient reports he has been feeling much better since he had his injection. Patient reports no new falls, and that he went to the gym for a half an hour on the NuStep. Patient reports that he he was cutting down branches this morning, "which was a bad idea".  Limitations Standing;Walking   Patient Stated Goals Patient would like to improve his balance.    Currently in Pain? No/denies      Neuromuscular Re-education  Blue foam balance static x 30 seconds  Single leg balancing with colored cones and having patient tap middle or outside cones with either right or left foot.  Side stepping with hands hovering x 10' in // bars for 2 sets  Sit to stand x 3 for 3 sets for speed, 5x sit to stand in 15.71 seconds. 2 sets x 3 repetitions for speed with 2# ball, 1 set for 3 repetitions for speed with 5# ball.  Rhomberg stance on blue foam with eyes open, closed, and closed with head turns, shooting baskets with foam balls x 10 for each condition  Walking without AD through 4 cones, with figure 8 turns x 2 repetitions. Increased his low back pain after cuing for increasing step length.    Patient required multiple prolonged rest breaks secondary to fatigue. He stated he began feeling weak as the session wore on, and  had a BP of 111/54. He stated he felt better after roughly one minute of sitting and stated he was comfortable leaving on his own accord.                           PT Education - 12/23/14 1644    Education provided Yes   Education Details Patient educated on progress with 5x sit to stand and to continue with HEP.    Person(s) Educated Patient   Methods Explanation;Demonstration   Comprehension Verbalized understanding;Returned demonstration;Verbal cues required             PT Long Term Goals - 12/23/14 1645    PT LONG TERM GOAL #1   Title Patient (< 59 years old) will complete five times sit to stand test in < 10 seconds indicating an increased LE strength and improved balance   Status Partially Met   PT LONG TERM GOAL #2   Title Patient will complete a TUG test in < 12 seconds without an AD for independent mobility and decreased fall risk.    Status On-going   PT LONG TERM GOAL #3   Title Patient will be independent with home exercise program for self-management of ankle symptoms/ difficulty.   Status Partially Met   PT LONG TERM GOAL #4   Title Pt will increase 6 minute walk time by 150 feet to improve endurance and aid in improving mobility   Status On-going               Plan - 12/23/14 1646    Clinical Impression Statement Patient presents with decreased lower back pain today, however he continues to be limited by ankle pain and lower back pain with weight bearing activities. He has made significant progress with 5x sit to stand times (19.5 seconds to 15.7 this session). Patient displays minimal step lengths with ambulation, likely contributing to his falls risk and decreased gait speed. Patient would benefit from skilled PT services to continue to address his balance and mobility deficits.    Pt will benefit from skilled therapeutic intervention in order to improve on the following deficits Abnormal gait;Decreased endurance;Impaired  sensation;Decreased strength;Pain;Decreased mobility;Difficulty walking;Decreased range of motion   Rehab Potential Fair   Clinical Impairments Affecting Rehab Potential Chronic pain, deconditioning   PT Frequency 2x / week   PT Duration 6 weeks   PT Treatment/Interventions Ultrasound;Gait  training;Therapeutic exercise;Therapeutic activities;Balance training   PT Next Visit Plan Progress with balance and strengthening exercises as tolerated. Increase LE knee flexion/hip flexion in gait to increase stride length.    PT Home Exercise Plan Maintained from last session    Consulted and Agree with Plan of Care Patient        Problem List Patient Active Problem List   Diagnosis Date Noted  . Allergic rhinitis 12/20/2014  . Angina pectoris 12/20/2014  . Arthritis 12/20/2014  . At risk for falling 12/20/2014  . Back pain with radiation 12/20/2014  . BPH (benign prostatic hyperplasia) 12/20/2014  . Carpal tunnel syndrome 12/20/2014  . Congestive heart failure 12/20/2014  . Fatty infiltration of liver 12/20/2014  . History of colonic polyps 12/20/2014  . Decreased potassium in the blood 12/20/2014  . Cervical pain 12/20/2014  . Skin lesion 12/20/2014  . Change in blood platelet count 12/20/2014  . Arthritis of ankle, right 12/20/2014  . Varus foot deformity, acquired 12/20/2014  . Insomnia   . Cervical spinal stenosis 04/28/2014  . DDD (degenerative disc disease), cervical 04/28/2014  . Cervical radiculitis 04/01/2014  . Neuritis or radiculitis due to rupture of lumbar intervertebral disc 12/21/2013  . Chronic gastritis 12/21/2010  . Pilonidal cyst 09/05/2009  . Obstructive apnea 05/07/2005  . Diabetes mellitus, type 2 04/22/2003  . Atherosclerosis of coronary artery 10/05/1999  . Essential (primary) hypertension 09/06/1998  . Arthropathia 03/25/1996  . HLD (hyperlipidemia) 03/25/1996  . Extreme obesity 09/21/1994  . GERD (gastroesophageal reflux disease) 08/10/1994   Kerman Passey, PT, DPT   12/24/2014, 8:21 AM  [place] MAIN [facility] SERVICES [address] [place], [place], [address] Phone: [phone]   Fax:  [phone]

## 2014-12-28 ENCOUNTER — Encounter: Payer: Self-pay | Admitting: Physical Therapy

## 2014-12-28 ENCOUNTER — Ambulatory Visit: Payer: Medicare Other | Attending: Family Medicine | Admitting: Physical Therapy

## 2014-12-28 ENCOUNTER — Other Ambulatory Visit: Payer: Self-pay | Admitting: *Deleted

## 2014-12-28 DIAGNOSIS — R531 Weakness: Secondary | ICD-10-CM | POA: Diagnosis not present

## 2014-12-28 DIAGNOSIS — R262 Difficulty in walking, not elsewhere classified: Secondary | ICD-10-CM | POA: Diagnosis not present

## 2014-12-28 DIAGNOSIS — R296 Repeated falls: Secondary | ICD-10-CM | POA: Diagnosis present

## 2014-12-28 MED ORDER — TRAZODONE HCL 100 MG PO TABS: ORAL_TABLET | ORAL | Status: DC

## 2014-12-28 NOTE — Telephone Encounter (Signed)
Refill request for trazodone 100mg  Last filled by MD on- 01/29/2014 #30 x5 Last Appt: 11/26/2014 Next Appt: 02/14/2015 Please advise refill?

## 2014-12-28 NOTE — Patient Instructions (Signed)
Patient provided with email to complete supine clamshells with yellow theraband and supine bridging to tolerance.

## 2014-12-28 NOTE — Therapy (Signed)
[place] MAIN [facility] SERVICES [address] [place], [place], [address] Phone: [phone]   Fax:  [phone]  Physical Therapy Treatment  Patient Details  Name: Mark Terry. MRN: [id number] Date of Birth: 01-25-1935 Referring Provider:  Birdie Sons, MD  Encounter Date: 12/28/2014      PT End of Session - 12/28/14 1737    Visit Number 4   Number of Visits 13   Date for PT Re-Evaluation 01/25/15   Authorization Type 4/10   PT Start Time 1459   PT Stop Time 1545   PT Time Calculation (min) 46 min   Equipment Utilized During Treatment Gait belt   Activity Tolerance Patient tolerated treatment well;Patient limited by fatigue;Patient limited by pain   Behavior During Therapy [facility] for tasks assessed/performed      Past Medical History  Diagnosis Date  . PONV (postoperative nausea and vomiting)   . Hyperlipidemia     takes Simvastin daily  . Coronary artery disease   . COPD (chronic obstructive pulmonary disease)   . Emphysema   . Shortness of breath     with exertion  . Sleep apnea     uses CPAP  . History of bronchitis     last time a couple of yrs ago  . Pneumonia     hx of last time a couple of yrs ago  . Arthritis   . Joint pain   . Joint swelling   . Chronic back pain     stenosis  . Bruises easily   . Melanoma   . H/O hiatal hernia   . GERD (gastroesophageal reflux disease)     takes Omeprazole daily  . History of gastric ulcer   . History of colon polyps   . H/O esophagogastroduodenoscopy   . Hypertension     takes Metoprolol daily and Cardura nightly  . Peripheral edema     takes Lasix daily  . Leg cramps     takes quinine prn  . Diabetes mellitus without complication     takes Glimepiride daily  . History of blood transfusion     no reaction noted  . Insomnia     takes Trazodone nightly  . History of MRSA infection     Past Surgical History  Procedure Laterality Date  .  Cholecystectomy  1970  . Back surgery      x 4, last lumb. laminectomy Dr. Annette Stable 2008  . Replacement total knee bilateral  '86 and 2000  . Total hip arthroplasty Right 2012    [facility]  . Neck surgery      x 2  . Coronary artery bypass graft  2000     3 vessels  . Colonoscopy    . Bilateral cataract surgery    . Reverse shoulder arthroplasty  05/30/2012    Procedure: REVERSE SHOULDER ARTHROPLASTY;  Surgeon: Augustin Schooling, MD;  Location: [place];  Service: Orthopedics;  Laterality: Right;  right reverse shoulder arthroplasty    There were no vitals filed for this visit.  Visit Diagnosis:  Weakness  Difficulty in walking      Subjective Assessment - 12/28/14 1528    Subjective Patient reports he has had increased symptoms over the past week and has not found relief from the cortisone shot or the exercises provided. Patient reports he continues to go to the gym and does back extensions and the NuStep daily.    Patient Stated Goals Patient would like to  improve his balance.    Currently in Pain? Yes   Pain Score --  Patient reports he has had trouble performing the exercises secondary to pain   Pain Location Back   Pain Orientation Lower   Pain Descriptors / Indicators Aching   Pain Type Chronic pain   Pain Onset More than a month ago   Aggravating Factors  Standing and weight bearing exercises.    Pain Relieving Factors Sitting       Manual therapy  Soft tissue mobilization provided to thoacolumbar spine junction on the left side, with good relief of symptoms.   There-Ex  Supine clamshells with yellow theraband x 12 for 2 sets  Supine bridging x 5, for 2 sets. Limited by cramping in left hamstring, likely due to completing the exercises too quickly. Cued to slow down his pace.  Leg press x 70# for 15 reps, 105# x 12 repetitions (increased LBP), 90# x 12 repetitions (increased LBP) Tandem stance in // bars x 3 repetitions bilaterally  Marching on blue foam pad in // bars with  two finger assistance    ** Patient requires frequent rest breaks throughout there-ex and used the rest room which was not billed                            PT Education - 12/28/14 1736    Education provided Yes   Education Details Updated HEP.   Person(s) Educated Patient   Methods Explanation;Demonstration;Handout   Comprehension Verbalized understanding;Returned demonstration;Verbal cues required             PT Long Term Goals - 12/23/14 1645    PT LONG TERM GOAL #1   Title Patient (< 86 years old) will complete five times sit to stand test in < 10 seconds indicating an increased LE strength and improved balance   Status Partially Met   PT LONG TERM GOAL #2   Title Patient will complete a TUG test in < 12 seconds without an AD for independent mobility and decreased fall risk.    Status On-going   PT LONG TERM GOAL #3   Title Patient will be independent with home exercise program for self-management of ankle symptoms/ difficulty.   Status Partially Met   PT LONG TERM GOAL #4   Title Pt will increase 6 minute walk time by 150 feet to improve endurance and aid in improving mobility   Status On-going               Plan - 12/28/14 1739    Clinical Impression Statement Patient presents today with increased lower back pain, hip pain, and limiting ankle pain which he attributes to increased time in the car over the weekend. He has not found relief from the exercises provided thus far, but was able to complete supine bridging and clamshells without symptoms today. Patient displays significant LE weakness and deconditioning which are likely contributing to his pain, and would benefit from skilled PT services to address this. Patient was able to complete higher level balance activities today, but continues to be limited in his progress secondary to pain in various joints.    Pt will benefit from skilled therapeutic intervention in order to improve on the  following deficits Abnormal gait;Decreased endurance;Impaired sensation;Decreased strength;Pain;Decreased mobility;Difficulty walking;Decreased range of motion   Rehab Potential Fair   Clinical Impairments Affecting Rehab Potential Chronic pain, deconditioning   PT Frequency 2x / week   PT  Duration 6 weeks   PT Treatment/Interventions Ultrasound;Gait training;Therapeutic exercise;Therapeutic activities;Balance training   PT Next Visit Plan Progress with balance and strengthening exercises as tolerated. Increase LE knee flexion/hip flexion in gait to increase stride length. Provide manual therapy if needed for pain control.    PT Home Exercise Plan See in patient instructions    Consulted and Agree with Plan of Care Patient        Problem List Patient Active Problem List   Diagnosis Date Noted  . Allergic rhinitis 12/20/2014  . Angina pectoris 12/20/2014  . Arthritis 12/20/2014  . At risk for falling 12/20/2014  . Back pain with radiation 12/20/2014  . BPH (benign prostatic hyperplasia) 12/20/2014  . Carpal tunnel syndrome 12/20/2014  . Congestive heart failure 12/20/2014  . Fatty infiltration of liver 12/20/2014  . History of colonic polyps 12/20/2014  . Decreased potassium in the blood 12/20/2014  . Cervical pain 12/20/2014  . Skin lesion 12/20/2014  . Change in blood platelet count 12/20/2014  . Arthritis of ankle, right 12/20/2014  . Varus foot deformity, acquired 12/20/2014  . Insomnia   . Cervical spinal stenosis 04/28/2014  . DDD (degenerative disc disease), cervical 04/28/2014  . Cervical radiculitis 04/01/2014  . Neuritis or radiculitis due to rupture of lumbar intervertebral disc 12/21/2013  . Chronic gastritis 12/21/2010  . Pilonidal cyst 09/05/2009  . Obstructive apnea 05/07/2005  . Diabetes mellitus, type 2 04/22/2003  . Atherosclerosis of coronary artery 10/05/1999  . Essential (primary) hypertension 09/06/1998  . Arthropathia 03/25/1996  . HLD  (hyperlipidemia) 03/25/1996  . Extreme obesity 09/21/1994  . GERD (gastroesophageal reflux disease) 08/10/1994   Kerman Passey, PT, DPT   12/28/2014, 5:43 PM  [place] MAIN [facility] SERVICES [address] [place], [place], [address] Phone: [phone]   Fax:  [phone]

## 2014-12-30 ENCOUNTER — Ambulatory Visit: Payer: Medicare Other | Admitting: Physical Therapy

## 2014-12-30 ENCOUNTER — Encounter: Payer: Self-pay | Admitting: Physical Therapy

## 2014-12-30 DIAGNOSIS — R531 Weakness: Secondary | ICD-10-CM

## 2014-12-30 DIAGNOSIS — R262 Difficulty in walking, not elsewhere classified: Secondary | ICD-10-CM

## 2014-12-30 DIAGNOSIS — R296 Repeated falls: Secondary | ICD-10-CM | POA: Diagnosis not present

## 2014-12-30 NOTE — Therapy (Signed)
[place] MAIN [facility] SERVICES [address] [place], [place], [address] Phone: [phone]   Fax:  [phone]  Physical Therapy Treatment  Patient Details  Name: Mark Terry. MRN: [id number] Date of Birth: 1934-08-26 Referring Provider:  Birdie Sons, MD  Encounter Date: 12/30/2014      PT End of Session - 12/30/14 1545    Visit Number 5   Number of Visits 13   Date for PT Re-Evaluation 01/25/15   Authorization Type 5/10   PT Start Time 1504   PT Stop Time 1543   PT Time Calculation (min) 39 min   Equipment Utilized During Treatment Gait belt   Activity Tolerance Patient tolerated treatment well;Patient limited by fatigue;Patient limited by pain   Behavior During Therapy [facility] for tasks assessed/performed      Past Medical History  Diagnosis Date  . PONV (postoperative nausea and vomiting)   . Hyperlipidemia     takes Simvastin daily  . Coronary artery disease   . COPD (chronic obstructive pulmonary disease)   . Emphysema   . Shortness of breath     with exertion  . Sleep apnea     uses CPAP  . History of bronchitis     last time a couple of yrs ago  . Pneumonia     hx of last time a couple of yrs ago  . Arthritis   . Joint pain   . Joint swelling   . Chronic back pain     stenosis  . Bruises easily   . Melanoma   . H/O hiatal hernia   . GERD (gastroesophageal reflux disease)     takes Omeprazole daily  . History of gastric ulcer   . History of colon polyps   . H/O esophagogastroduodenoscopy   . Hypertension     takes Metoprolol daily and Cardura nightly  . Peripheral edema     takes Lasix daily  . Leg cramps     takes quinine prn  . Diabetes mellitus without complication     takes Glimepiride daily  . History of blood transfusion     no reaction noted  . Insomnia     takes Trazodone nightly  . History of MRSA infection     Past Surgical History  Procedure Laterality Date  .  Cholecystectomy  1970  . Back surgery      x 4, last lumb. laminectomy Dr. Annette Stable 2008  . Replacement total knee bilateral  '86 and 2000  . Total hip arthroplasty Right 2012    [facility]  . Neck surgery      x 2  . Coronary artery bypass graft  2000     3 vessels  . Colonoscopy    . Bilateral cataract surgery    . Reverse shoulder arthroplasty  05/30/2012    Procedure: REVERSE SHOULDER ARTHROPLASTY;  Surgeon: Augustin Schooling, MD;  Location: [place];  Service: Orthopedics;  Laterality: Right;  right reverse shoulder arthroplasty    There were no vitals filed for this visit.  Visit Diagnosis:  Weakness  Difficulty in walking      Subjective Assessment - 12/30/14 1503    Subjective Patient reports he has begun having "nerve" cramps in his LLE today. He has not found as much improvement from the cortisone shot he received last week as he would have liked.    Limitations Standing;Walking   Patient Stated Goals Patient would like to improve his balance.  Currently in Pain? Yes   Pain Score 5    Pain Location Back   Pain Orientation Lower   Pain Descriptors / Indicators Aching   Pain Type Chronic pain   Pain Onset More than a month ago   Aggravating Factors  Standing and weight bearing exercises    Pain Relieving Factors Sitting    Multiple Pain Sites Yes   Pain Score 7   Pain Location Ankle   Pain Orientation Right   Pain Descriptors / Indicators Aching   Pain Type Chronic pain   Pain Onset More than a month ago   Aggravating Factors  Ambulation    Pain Relieving Factors Sitting       There-Ex Supine lower trunk rotations x 12 bilaterally  Supine clamshells with yellow theraband x 12 for 2 sets  5x sit to stand 24.18 seconds  TUG - 23.13 seconds  Toe taps to 4" stool as fast as possible x 15 seconds bilaterally.  Neuromuscular re-education  Step ups to blue foam pad with no hand hold assistance for as many reps as possible in 30 seconds x 2 sets bilaterally  Ball  tossing drill while standing in Rhomberg stance on blue foam pad x 45 seconds  X 2 sets  Football catching while standing in Rhomberg x 45 seconds, with one significant loss of balance, which he was able to recover from with assistance from UEs on // bars.                             PT Education - 12/30/14 1544    Education provided Yes   Education Details Patient instructed to begin performing 1 minute quickly, with 1 minute slowly on the NuStep when he goes to the gym in the mornings.    Person(s) Educated Patient   Methods Explanation;Demonstration   Comprehension Verbalized understanding;Returned demonstration             PT Long Term Goals - 12/23/14 1645    PT LONG TERM GOAL #1   Title Patient (< 78 years old) will complete five times sit to stand test in < 10 seconds indicating an increased LE strength and improved balance   Status Partially Met   PT LONG TERM GOAL #2   Title Patient will complete a TUG test in < 12 seconds without an AD for independent mobility and decreased fall risk.    Status On-going   PT LONG TERM GOAL #3   Title Patient will be independent with home exercise program for self-management of ankle symptoms/ difficulty.   Status Partially Met   PT LONG TERM GOAL #4   Title Pt will increase 6 minute walk time by 150 feet to improve endurance and aid in improving mobility   Status On-going               Plan - 12/30/14 1651    Clinical Impression Statement Patient presents again with low back pain, ankle pain, and hip pain which limit his endurance and tolerance for weight bearing activities for therapeutic exercise. He does show improved TUG time today, though his 5x sit to stand time increased, indicating mixed improvement with balance outcome measures. Patient requires increased LE strength and endurance to increase his balance and safety with dynamic mobility.    Pt will benefit from skilled therapeutic intervention in  order to improve on the following deficits Abnormal gait;Decreased endurance;Impaired sensation;Decreased strength;Pain;Decreased mobility;Difficulty walking;Decreased range of  motion   Rehab Potential Fair   Clinical Impairments Affecting Rehab Potential Chronic pain, deconditioning   PT Frequency 2x / week   PT Duration 6 weeks   PT Treatment/Interventions Ultrasound;Gait training;Therapeutic exercise;Therapeutic activities;Balance training   PT Next Visit Plan Progress endurance training, LE strengthening, and balance activities as tolerated.    PT Home Exercise Plan Maintained from last session    Consulted and Agree with Plan of Care Patient        Problem List Patient Active Problem List   Diagnosis Date Noted  . Allergic rhinitis 12/20/2014  . Angina pectoris 12/20/2014  . Arthritis 12/20/2014  . At risk for falling 12/20/2014  . Back pain with radiation 12/20/2014  . BPH (benign prostatic hyperplasia) 12/20/2014  . Carpal tunnel syndrome 12/20/2014  . Congestive heart failure 12/20/2014  . Fatty infiltration of liver 12/20/2014  . History of colonic polyps 12/20/2014  . Decreased potassium in the blood 12/20/2014  . Cervical pain 12/20/2014  . Skin lesion 12/20/2014  . Change in blood platelet count 12/20/2014  . Arthritis of ankle, right 12/20/2014  . Varus foot deformity, acquired 12/20/2014  . Insomnia   . Cervical spinal stenosis 04/28/2014  . DDD (degenerative disc disease), cervical 04/28/2014  . Cervical radiculitis 04/01/2014  . Neuritis or radiculitis due to rupture of lumbar intervertebral disc 12/21/2013  . Chronic gastritis 12/21/2010  . Pilonidal cyst 09/05/2009  . Obstructive apnea 05/07/2005  . Diabetes mellitus, type 2 04/22/2003  . Atherosclerosis of coronary artery 10/05/1999  . Essential (primary) hypertension 09/06/1998  . Arthropathia 03/25/1996  . HLD (hyperlipidemia) 03/25/1996  . Extreme obesity 09/21/1994  . GERD (gastroesophageal reflux  disease) 08/10/1994   Kerman Passey, PT, DPT   12/30/2014, 4:54 PM  [place] MAIN [facility] SERVICES [address] [place], [place], [address] Phone: [phone]   Fax:  [phone]

## 2015-01-04 ENCOUNTER — Ambulatory Visit: Payer: Medicare Other | Admitting: Physical Therapy

## 2015-01-04 ENCOUNTER — Encounter: Payer: Self-pay | Admitting: Physical Therapy

## 2015-01-04 DIAGNOSIS — R531 Weakness: Secondary | ICD-10-CM

## 2015-01-04 DIAGNOSIS — R296 Repeated falls: Secondary | ICD-10-CM | POA: Diagnosis not present

## 2015-01-04 DIAGNOSIS — R262 Difficulty in walking, not elsewhere classified: Secondary | ICD-10-CM

## 2015-01-04 NOTE — Therapy (Signed)
[facility] [address] , [place], [address] Phone: [phone]   Fax:  [phone]  Physical Therapy Treatment/ DC summary  Patient Details  Name: Mark William Cline Jr. MRN: [id number] Date of Birth: 09/13/1934 Referring Provider:  Fisher, Donald E, MD  Encounter Date: 01/04/2015      PT End of Session - 01/04/15 1600    Visit Number 6   Number of Visits 13   Date for PT Re-Evaluation 01/25/15   Authorization Type 6/10   PT Start Time 0345      Past Medical History  Diagnosis Date  . PONV (postoperative nausea and vomiting)   . Hyperlipidemia     takes Simvastin daily  . Coronary artery disease   . COPD (chronic obstructive pulmonary disease)   . Emphysema   . Shortness of breath     with exertion  . Sleep apnea     uses CPAP  . History of bronchitis     last time a couple of yrs ago  . Pneumonia     hx of last time a couple of yrs ago  . Arthritis   . Joint pain   . Joint swelling   . Chronic back pain     stenosis  . Bruises easily   . Melanoma   . H/O hiatal hernia   . GERD (gastroesophageal reflux disease)     takes Omeprazole daily  . History of gastric ulcer   . History of colon polyps   . H/O esophagogastroduodenoscopy   . Hypertension     takes Metoprolol daily and Cardura nightly  . Peripheral edema     takes Lasix daily  . Leg cramps     takes quinine prn  . Diabetes mellitus without complication     takes Glimepiride daily  . History of blood transfusion     no reaction noted  . Insomnia     takes Trazodone nightly  . History of MRSA infection     Past Surgical History  Procedure Laterality Date  . Cholecystectomy  1970  . Back surgery      x 4, last lumb. laminectomy Dr. Pool 2008  . Replacement total knee bilateral  '86 and 2000  . Total hip arthroplasty Right 2012    [facility]  . Neck surgery      x 2  . Coronary artery bypass graft  2000     3 vessels  .  Colonoscopy    . Bilateral cataract surgery    . Reverse shoulder arthroplasty  05/30/2012    Procedure: REVERSE SHOULDER ARTHROPLASTY;  Surgeon: Steven R Norris, MD;  Location: [facility] OR;  Service: Orthopedics;  Laterality: Right;  right reverse shoulder arthroplasty    There were no vitals filed for this visit.  Visit Diagnosis:  Weakness  Difficulty in walking      Subjective Assessment - 01/04/15 1551    Subjective Patient is having such terrible cramps and he feels that he cannot keep up the therapy due to the cramping. He is doing his HEP  .    Limitations Standing;Walking   Patient Stated Goals Patient would like to improve his balance.    Currently in Pain? Yes   Pain Score 7    Pain Location Back   Pain Type Chronic pain   Aggravating Factors  standing and walking.    Pain Relieving Factors sitting   Multiple Pain Sites Yes          Hip 4 way  Toe taps to 4" stool as fast as possible x 15 seconds bilaterally.  Neuromuscular re-education  Step ups to blue foam pad with no hand hold assistance for as many reps as possible in 30 seconds x 2 sets bilaterally  Ball tossing drill while standing in Rhomberg stance on blue foam pad x 45 seconds X 2 sets Squats x 10 Sit to stand x 10 Leg press 75# x 15 x 2  Fatigue with sit to stand but demonstrating more control, Increase weight for standing exercises                              PT Long Term Goals - 24-Jan-2015 1602    PT LONG TERM GOAL #1   Status Partially Met   PT LONG TERM GOAL #2   Status Partially Met   PT LONG TERM GOAL #3   Status Achieved   PT LONG TERM GOAL #4   Status Partially Met                 G-Codes - 2015/01/24 1601    Mobility: Walking and Moving Around Goal Status [phone]) At least 60 percent but less than 80 percent impaired, limited or restricted      Problem List Patient Active Problem List   Diagnosis Date Noted  . Allergic rhinitis 12/20/2014  . Angina  pectoris 12/20/2014  . Arthritis 12/20/2014  . At risk for falling 12/20/2014  . Back pain with radiation 12/20/2014  . BPH (benign prostatic hyperplasia) 12/20/2014  . Carpal tunnel syndrome 12/20/2014  . Congestive heart failure 12/20/2014  . Fatty infiltration of liver 12/20/2014  . History of colonic polyps 12/20/2014  . Decreased potassium in the blood 12/20/2014  . Cervical pain 12/20/2014  . Skin lesion 12/20/2014  . Change in blood platelet count 12/20/2014  . Arthritis of ankle, right 12/20/2014  . Varus foot deformity, acquired 12/20/2014  . Insomnia   . Cervical spinal stenosis 04/28/2014  . DDD (degenerative disc disease), cervical 04/28/2014  . Cervical radiculitis 04/01/2014  . Neuritis or radiculitis due to rupture of lumbar intervertebral disc 12/21/2013  . Chronic gastritis 12/21/2010  . Pilonidal cyst 09/05/2009  . Obstructive apnea 05/07/2005  . Diabetes mellitus, type 2 04/22/2003  . Atherosclerosis of coronary artery 10/05/1999  . Essential (primary) hypertension 09/06/1998  . Arthropathia 03/25/1996  . HLD (hyperlipidemia) 03/25/1996  . Extreme obesity 09/21/1994  . GERD (gastroesophageal reflux disease) 08/10/1994    Alanson Puls 01/24/15, 4:03 PM  [place] [facility] [address] [place], [place], [address] Phone: [phone]   Fax:  [phone]

## 2015-01-06 ENCOUNTER — Ambulatory Visit: Payer: Medicare Other | Admitting: Physical Therapy

## 2015-01-11 ENCOUNTER — Ambulatory Visit: Payer: Medicare Other | Admitting: Physical Therapy

## 2015-01-13 ENCOUNTER — Ambulatory Visit: Payer: Medicare Other | Admitting: Physical Therapy

## 2015-02-14 ENCOUNTER — Ambulatory Visit: Payer: Self-pay | Admitting: Family Medicine

## 2015-02-16 ENCOUNTER — Ambulatory Visit (INDEPENDENT_AMBULATORY_CARE_PROVIDER_SITE_OTHER): Payer: Medicare Other | Admitting: Family Medicine

## 2015-02-16 ENCOUNTER — Encounter: Payer: Self-pay | Admitting: Family Medicine

## 2015-02-16 VITALS — BP 110/60 | HR 94 | Temp 98.6°F | Resp 16 | Ht 61.5 in | Wt 233.0 lb

## 2015-02-16 DIAGNOSIS — E1122 Type 2 diabetes mellitus with diabetic chronic kidney disease: Secondary | ICD-10-CM

## 2015-02-16 DIAGNOSIS — N400 Enlarged prostate without lower urinary tract symptoms: Secondary | ICD-10-CM | POA: Diagnosis not present

## 2015-02-16 DIAGNOSIS — I10 Essential (primary) hypertension: Secondary | ICD-10-CM

## 2015-02-16 DIAGNOSIS — I509 Heart failure, unspecified: Secondary | ICD-10-CM

## 2015-02-16 DIAGNOSIS — N189 Chronic kidney disease, unspecified: Secondary | ICD-10-CM

## 2015-02-16 LAB — POCT GLYCOSYLATED HEMOGLOBIN (HGB A1C)
ESTIMATED AVERAGE GLUCOSE: 157
HEMOGLOBIN A1C: 7.1

## 2015-02-16 MED ORDER — METFORMIN HCL ER 500 MG PO TB24: 500.0000 mg | ORAL_TABLET | Freq: Every day | ORAL | Status: DC

## 2015-02-16 NOTE — Progress Notes (Signed)
Patient: Mark Terry. Male    DOB: Aug 15, 1934   80 y.o.   MRN: [id number] Visit Date: 02/16/2015  Today's Provider: [name], MD   Chief Complaint  Patient presents with  . Follow-up  . Diabetes  . Hypertension  . Hyperlipidemia   Subjective:    HPI      Diabetes Mellitus Type II, Follow-up:   Lab Results  Component Value Date   HGBA1C 7.1 02/16/2015   Last seen for diabetes 3 months ago when his A1c was 7.6 Management changes included Started Metformin ER 500 mg qd. He reports good compliance with treatment. He is not having side effects. none Current symptoms include none and have been unchanged. Home blood sugar records: fasting range: 150  He has had 2 steroid injection in his back since then which cause his blood sugars to spike for a brief period of time.   Episodes of hypoglycemia? no   ------------------------------------------------------------------------   Hypertension, follow-up:  BP Readings from Last 3 Encounters:  02/16/15 110/60  12/20/14 108/60  05/31/12 121/64    He was last seen for hypertension 3 months ago.  BP at that visit was 140/78. Management changes since that visit include none .He reports good compliance with treatment. He is not having side effects. none  He is not exercising. He is adherent to low salt diet.   Outside blood pressures are 125/70. He is experiencing none.  Patient denies none.   Cardiovascular risk factors include none.  Use of agents associated with hypertension: none.   ------------------------------------------------------------------------    Lipid/Cholesterol, Follow-up:   Last seen for this 3 months ago.  Management changes since that visit include none. He continues simvastatin. Cholesterol was will controlled at 147 when last checked in May   He reports good compliance with treatment. He is not having side effects. none  Wt Readings from Last 3 Encounters:    02/16/15 233 lb (105.688 kg)  12/20/14 232 lb (105.235 kg)  05/27/12 241 lb 10 oz (109.6 kg)    ------------------------------------------------------------------------    Allergies  Allergen Reactions  . Codeine Sulfate   . Darvon [Propoxyphene Hcl] Other (See Comments)    Short of breath  . Demerol [Meperidine] Other (See Comments)    Short of breath   Previous Medications   ASPIRIN 81 MG TABLET    Take 81 mg by mouth daily.   DOXAZOSIN (CARDURA) 8 MG TABLET    Take 8 mg by mouth at bedtime.   FLUTICASONE (FLONASE) 50 MCG/ACT NASAL SPRAY    Place 2 sprays into the nose daily.   FUROSEMIDE (LASIX) 40 MG TABLET    Take 40 mg by mouth daily.   GABAPENTIN (NEURONTIN) 300 MG CAPSULE    Take 300 mg by mouth 3 (three) times daily.   GLIMEPIRIDE (AMARYL) 1 MG TABLET    Take 1 mg by mouth daily before breakfast.   HYDROCODONE-ACETAMINOPHEN (NORCO/VICODIN) 5-325 MG PER TABLET    Take 1 tablet by mouth every 6 (six) hours as needed for moderate pain.   LORATADINE (CLARITIN) 10 MG TABLET    Take 10 mg by mouth daily.   MELOXICAM (MOBIC) 15 MG TABLET    Take 15 mg by mouth daily.   METFORMIN (GLUCOPHAGE-XR) 500 MG 24 HR TABLET    Take 1 tablet by mouth daily.   METHOCARBAMOL (ROBAXIN) 500 MG TABLET    Take 1 tablet (500 mg total) by mouth 3 (three) times daily as  needed.   METOPROLOL TARTRATE (LOPRESSOR) 25 MG TABLET    Take 25 mg by mouth 2 (two) times daily.   MULTIPLE VITAMIN (MULTIVITAMIN WITH MINERALS) TABS    Take 1 tablet by mouth daily.   OMEPRAZOLE (PRILOSEC) 20 MG CAPSULE    Take 20 mg by mouth daily.   OXYCODONE-ACETAMINOPHEN (ROXICET) 5-325 MG PER TABLET    Take 1-2 tablets by mouth every 4 (four) hours as needed for pain.   POTASSIUM CHLORIDE SA (K-DUR,KLOR-CON) 20 MEQ TABLET    Take 20 mEq by mouth daily.   QUININE (QUALAQUIN) 324 MG CAPSULE    Take 648 mg by mouth every 8 (eight) hours. For leg cramps   SIMVASTATIN (ZOCOR) 40 MG TABLET    Take 40 mg by mouth every evening.    TRAZODONE (DESYREL) 100 MG TABLET    1/2 to 1 tablet at bedtime as needed for insomnia.    Review of Systems  Social History  Substance Use Topics  . Smoking status: Former Smoker -- 1.00 packs/day for 25 years    Types: Cigarettes    Quit date: 07/09/1968  . Smokeless tobacco: Not on file     Comment: quit at age 59  . Alcohol Use: No  Cognitive Testing - 6-CIT  Correct? Score   What year is it? yes 0 0 or 4  What month is it? yes 0 0 or 3  Memorize:    Pia Mau,  44,  [place],      What time is it? (within 1 hour) yes 0 0 or 3  Count backwards from 20 yes 0 0, 2, or 4  Name the months of the year yes 0 0, 2, or 4  Repeat name & address above yes 5 0, 2, 4, 6, 8, or 10       TOTAL SCORE  5/28   Interpretation:  Normal  Normal (0-7) Abnormal (8-28)    Objective:   BP 110/60 mmHg  Pulse 94  Temp(Src) 98.6 F (37 C) (Oral)  Resp 16  Ht 5' 1.5" (1.562 m)  Wt 233 lb (105.688 kg)  BMI 43.32 kg/m2  SpO2 97%  Physical Exam  General Appearance:    Alert, cooperative, no distress, obese  Eyes:    PERRL, conjunctiva/corneas clear, EOM's intact       Lungs:     Clear to auscultation bilaterally, respirations unlabored  Heart:    Regular rate and rhythm  Neurologic:   Awake, alert, oriented x 3. No apparent focal neurological           defect.   Vasc:   +2 pedal pulses. Trace edema       Assessment & Plan:     1. Type 2 diabetes mellitus with diabetic chronic kidney disease Improved on metformin. Refill with 90 day supply and follow up in about 3 months.  - POCT glycosylated hemoglobin (Hb A1C)  2. Congestive heart failure, unspecified congestive heart failure chronicity, unspecified congestive heart failure type Asymptomatic. Compliant with medication.  Continue aggressive risk factor modification.  Continue routine follow up with Dr. Clayborn Bigness.   3. Essential (primary) hypertension well controlled Continue current medications.    4. BPH (benign  prostatic hyperplasia) Doing well with Doxazocin   5. Morbid obesity.  Doing well on diabetic diet. He is exercising 3-5 times a week and will try to get in more physical activity.       [name], MD  [facility]110/60

## 2015-03-03 ENCOUNTER — Other Ambulatory Visit: Payer: Self-pay | Admitting: Family Medicine

## 2015-04-23 ENCOUNTER — Ambulatory Visit (INDEPENDENT_AMBULATORY_CARE_PROVIDER_SITE_OTHER): Payer: Medicare Other

## 2015-04-23 DIAGNOSIS — Z23 Encounter for immunization: Secondary | ICD-10-CM | POA: Diagnosis not present

## 2015-04-25 DIAGNOSIS — Z96641 Presence of right artificial hip joint: Secondary | ICD-10-CM | POA: Insufficient documentation

## 2015-05-06 ENCOUNTER — Other Ambulatory Visit: Payer: Self-pay | Admitting: Family Medicine

## 2015-05-19 ENCOUNTER — Encounter: Payer: Self-pay | Admitting: Family Medicine

## 2015-05-19 ENCOUNTER — Ambulatory Visit (INDEPENDENT_AMBULATORY_CARE_PROVIDER_SITE_OTHER): Payer: Medicare Other | Admitting: Family Medicine

## 2015-05-19 VITALS — BP 142/80 | HR 88 | Temp 98.2°F | Resp 16 | Wt 233.0 lb

## 2015-05-19 DIAGNOSIS — G4733 Obstructive sleep apnea (adult) (pediatric): Secondary | ICD-10-CM | POA: Diagnosis not present

## 2015-05-19 DIAGNOSIS — R682 Dry mouth, unspecified: Secondary | ICD-10-CM

## 2015-05-19 DIAGNOSIS — E119 Type 2 diabetes mellitus without complications: Secondary | ICD-10-CM

## 2015-05-19 LAB — POCT GLYCOSYLATED HEMOGLOBIN (HGB A1C)
Est. average glucose Bld gHb Est-mCnc: 160
Hemoglobin A1C: 7.2

## 2015-05-19 NOTE — Progress Notes (Signed)
Patient: Mark Terry. Male    DOB: [birth year]   80 y.o.   MRN: [id number] Visit Date: 05/19/2015  Today's Provider: [name], MD   Chief Complaint  Patient presents with  . Diabetes    follow up  . Hyperlipidemia    follow up  . Hypertension    follow up   Subjective:    HPI  Diabetes Mellitus Type II, Follow-up:   Lab Results  Component Value Date   HGBA1C 7.1 02/16/2015   Last seen for diabetes 3 months ago.  Management since then includes no changes. He reports good compliance with treatment. He is not having side effects.  Current symptoms include none and have been stable. Home blood sugar records: fasting range: 130-150  Episodes of hypoglycemia? yes - 2- 3 weeks ago he had a blood sugar reading below 100   Current Insulin Regimen: none Most Recent Eye Exam: <1 year Weight trend: stable Prior visit with dietician: no Current diet: in general, a "healthy" diet   Current exercise: works out at [employer] on stationary equipment 5 days a week  ------------------------------------------------------------------------   Hypertension, follow-up:  BP Readings from Last 3 Encounters:  05/19/15 142/80  02/16/15 110/60  12/20/14 108/60    He was last seen for hypertension 3 months ago.  BP at that visit was  110/60. Management since that visit includes no changes .He reports good compliance with treatment. He is not having side effects.  He is exercising. He is adherent to low salt diet.   Outside blood pressures are 99991111 systolic reading over A999333 diastolic reading. He is experiencing lower extremity edema.  Patient denies chest pain, chest pressure/discomfort, claudication, dyspnea, exertional chest pressure/discomfort, fatigue, irregular heart beat, near-syncope, orthopnea, palpitations, paroxysmal nocturnal dyspnea, syncope and tachypnea.   Cardiovascular risk factors include advanced age (older than 66 for men, 21 for women),  diabetes mellitus, dyslipidemia, hypertension and male gender.  Use of agents associated with hypertension: NSAIDS.   ------------------------------------------------------------------------    Lipid/Cholesterol, Follow-up:   Last seen for this 3 months ago.  Management since that visit includes no changes.  Last Lipid Panel: No results found for: CHOL, TRIG, HDL, CHOLHDL, VLDL, LDLCALC, LDLDIRECT  He reports good compliance with treatment. He is not having side effects.   Wt Readings from Last 3 Encounters:  05/19/15 233 lb (105.688 kg)  02/16/15 233 lb (105.688 kg)  12/20/14 232 lb (105.235 kg)    ------------------------------------------------------------------------ He states he does have trouble with dry mouth especially over night. He wears a CPAP with humidity and a chin strap every night.      Allergies  Allergen Reactions  . Codeine Sulfate   . Darvon [Propoxyphene Hcl] Other (See Comments)    Short of breath  . Demerol [Meperidine] Other (See Comments)    Short of breath   Previous Medications   ASPIRIN 81 MG TABLET    Take 81 mg by mouth daily.   DOXAZOSIN (CARDURA) 8 MG TABLET    Take 8 mg by mouth at bedtime.   FLUTICASONE (FLONASE) 50 MCG/ACT NASAL SPRAY    Place 2 sprays into the nose daily.   FUROSEMIDE (LASIX) 40 MG TABLET    TAKE ONE (1) TABLET BY MOUTH EVERY DAY   GABAPENTIN (NEURONTIN) 300 MG CAPSULE    Take 300 mg by mouth 3 (three) times daily.   GLIMEPIRIDE (AMARYL) 1 MG TABLET    Take 1 mg by mouth daily before  breakfast.   HYDROCODONE-ACETAMINOPHEN (NORCO/VICODIN) 5-325 MG PER TABLET    Take 1 tablet by mouth every 6 (six) hours as needed for moderate pain.   LORATADINE (CLARITIN) 10 MG TABLET    Take 10 mg by mouth daily.   MELOXICAM (MOBIC) 15 MG TABLET    TAKE ONE (1) TABLET EACH DAY AS NEEDED   METFORMIN (GLUCOPHAGE-XR) 500 MG 24 HR TABLET    Take 1 tablet (500 mg total) by mouth daily.   METHOCARBAMOL (ROBAXIN) 500 MG TABLET    Take 1  tablet (500 mg total) by mouth 3 (three) times daily as needed.   METOPROLOL TARTRATE (LOPRESSOR) 25 MG TABLET    Take 25 mg by mouth 2 (two) times daily.   MULTIPLE VITAMIN (MULTIVITAMIN WITH MINERALS) TABS    Take 1 tablet by mouth daily.   OMEPRAZOLE (PRILOSEC) 20 MG CAPSULE    Take 20 mg by mouth daily.   OXYCODONE-ACETAMINOPHEN (ROXICET) 5-325 MG PER TABLET    Take 1-2 tablets by mouth every 4 (four) hours as needed for pain.   POTASSIUM CHLORIDE SA (K-DUR,KLOR-CON) 20 MEQ TABLET    Take 20 mEq by mouth daily.   QUININE (QUALAQUIN) 324 MG CAPSULE    Take 648 mg by mouth every 8 (eight) hours. For leg cramps   SIMVASTATIN (ZOCOR) 40 MG TABLET    Take 40 mg by mouth every evening.   TRAZODONE (DESYREL) 100 MG TABLET    1/2 to 1 tablet at bedtime as needed for insomnia.    Review of Systems  Constitutional: Negative for fever, chills and appetite change.  Respiratory: Negative for chest tightness, shortness of breath and wheezing.   Cardiovascular: Positive for leg swelling (right foot occasionally). Negative for chest pain and palpitations.  Gastrointestinal: Negative for nausea, vomiting and abdominal pain.  Endocrine: Negative for cold intolerance, heat intolerance, polydipsia, polyphagia and polyuria.  Musculoskeletal: Positive for arthralgias.  Neurological: Negative for weakness and headaches.    Social History  Substance Use Topics  . Smoking status: Former Smoker -- 1.00 packs/day for 25 years    Types: Cigarettes    Quit date: 07/09/1968  . Smokeless tobacco: Not on file     Comment: quit at age 18  . Alcohol Use: No   Objective:   BP 142/80 mmHg  Pulse 88  Temp(Src) 98.2 F (36.8 C) (Oral)  Resp 16  Wt 233 lb (105.688 kg)  SpO2 99%  Physical Exam  General Appearance:    Alert, cooperative, no distress, obese  Eyes:    PERRL, conjunctiva/corneas clear, EOM's intact       Lungs:     Clear to auscultation bilaterally, respirations unlabored  Heart:    Regular rate  and rhythm  Neurologic:   Awake, alert, oriented x 3. No apparent focal neurological           defect.        Results for orders placed or performed in visit on 05/19/15  POCT HgB A1C  Result Value Ref Range   Hemoglobin A1C 7.2    Est. average glucose Bld gHb Est-mCnc 160        Assessment & Plan:     1. Controlled type 2 diabetes mellitus without complication, without long-term current use of insulin (HCC) Stable. Continue current medications.   - POCT HgB A1C  2. Obstructive apnea Using CPAP with humidity consistently.   3. Dry mouth Recommend he try OTC Biotene        Elenore Rota  Caryn Section, MD  [facility]

## 2015-05-19 NOTE — Patient Instructions (Signed)
Try OTC Biotene as instructed on label for dry mouth

## 2015-05-20 DIAGNOSIS — M4806 Spinal stenosis, lumbar region: Secondary | ICD-10-CM | POA: Diagnosis not present

## 2015-05-20 DIAGNOSIS — M5416 Radiculopathy, lumbar region: Secondary | ICD-10-CM | POA: Diagnosis not present

## 2015-05-20 DIAGNOSIS — M5136 Other intervertebral disc degeneration, lumbar region: Secondary | ICD-10-CM | POA: Diagnosis not present

## 2015-06-14 DIAGNOSIS — M5136 Other intervertebral disc degeneration, lumbar region: Secondary | ICD-10-CM | POA: Diagnosis not present

## 2015-06-14 DIAGNOSIS — M5030 Other cervical disc degeneration, unspecified cervical region: Secondary | ICD-10-CM | POA: Diagnosis not present

## 2015-06-14 DIAGNOSIS — M5412 Radiculopathy, cervical region: Secondary | ICD-10-CM | POA: Diagnosis not present

## 2015-06-14 DIAGNOSIS — M5416 Radiculopathy, lumbar region: Secondary | ICD-10-CM | POA: Diagnosis not present

## 2015-06-14 DIAGNOSIS — M4802 Spinal stenosis, cervical region: Secondary | ICD-10-CM | POA: Diagnosis not present

## 2015-07-01 ENCOUNTER — Other Ambulatory Visit: Payer: Self-pay | Admitting: Family Medicine

## 2015-07-01 DIAGNOSIS — I10 Essential (primary) hypertension: Secondary | ICD-10-CM

## 2015-07-07 ENCOUNTER — Other Ambulatory Visit: Payer: Self-pay | Admitting: Family Medicine

## 2015-07-13 DIAGNOSIS — G4733 Obstructive sleep apnea (adult) (pediatric): Secondary | ICD-10-CM | POA: Diagnosis not present

## 2015-08-19 DIAGNOSIS — M5136 Other intervertebral disc degeneration, lumbar region: Secondary | ICD-10-CM | POA: Diagnosis not present

## 2015-08-19 DIAGNOSIS — M5416 Radiculopathy, lumbar region: Secondary | ICD-10-CM | POA: Diagnosis not present

## 2015-08-23 DIAGNOSIS — M25571 Pain in right ankle and joints of right foot: Secondary | ICD-10-CM | POA: Diagnosis not present

## 2015-08-23 DIAGNOSIS — M1990 Unspecified osteoarthritis, unspecified site: Secondary | ICD-10-CM | POA: Diagnosis not present

## 2015-09-05 ENCOUNTER — Other Ambulatory Visit: Payer: Self-pay | Admitting: Family Medicine

## 2015-09-06 DIAGNOSIS — Z08 Encounter for follow-up examination after completed treatment for malignant neoplasm: Secondary | ICD-10-CM | POA: Diagnosis not present

## 2015-09-06 DIAGNOSIS — Z1283 Encounter for screening for malignant neoplasm of skin: Secondary | ICD-10-CM | POA: Diagnosis not present

## 2015-09-06 DIAGNOSIS — L570 Actinic keratosis: Secondary | ICD-10-CM | POA: Diagnosis not present

## 2015-09-06 DIAGNOSIS — L728 Other follicular cysts of the skin and subcutaneous tissue: Secondary | ICD-10-CM | POA: Diagnosis not present

## 2015-09-06 DIAGNOSIS — B372 Candidiasis of skin and nail: Secondary | ICD-10-CM | POA: Diagnosis not present

## 2015-09-06 DIAGNOSIS — Z85820 Personal history of malignant melanoma of skin: Secondary | ICD-10-CM | POA: Diagnosis not present

## 2015-09-12 DIAGNOSIS — M4806 Spinal stenosis, lumbar region: Secondary | ICD-10-CM | POA: Diagnosis not present

## 2015-09-12 DIAGNOSIS — M5136 Other intervertebral disc degeneration, lumbar region: Secondary | ICD-10-CM | POA: Diagnosis not present

## 2015-09-12 DIAGNOSIS — M5030 Other cervical disc degeneration, unspecified cervical region: Secondary | ICD-10-CM | POA: Diagnosis not present

## 2015-09-12 DIAGNOSIS — M4802 Spinal stenosis, cervical region: Secondary | ICD-10-CM | POA: Diagnosis not present

## 2015-09-12 DIAGNOSIS — M5416 Radiculopathy, lumbar region: Secondary | ICD-10-CM | POA: Diagnosis not present

## 2015-09-13 ENCOUNTER — Encounter: Payer: Self-pay | Admitting: Family Medicine

## 2015-09-13 ENCOUNTER — Ambulatory Visit (INDEPENDENT_AMBULATORY_CARE_PROVIDER_SITE_OTHER): Payer: Medicare Other | Admitting: Family Medicine

## 2015-09-13 VITALS — BP 128/70 | HR 97 | Temp 98.1°F | Resp 16 | Ht 61.5 in | Wt 231.0 lb

## 2015-09-13 DIAGNOSIS — M7552 Bursitis of left shoulder: Secondary | ICD-10-CM | POA: Diagnosis not present

## 2015-09-13 MED ORDER — METHYLPREDNISOLONE ACETATE 80 MG/ML IJ SUSP
80.0000 mg | Freq: Once | INTRAMUSCULAR | Status: AC
  Administered 2015-09-13: 80 mg via INTRA_ARTICULAR

## 2015-09-13 NOTE — Progress Notes (Signed)
Patient: Mark Terry. Male    DOB: [birth year]   81 y.o.   MRN: [id number] Visit Date: 09/13/2015  Today's Provider: [name], MD   Chief Complaint  Patient presents with  . Shoulder Pain   Subjective:    Shoulder Pain  The pain is present in the left shoulder, neck and left arm. This is a recurrent problem. The current episode started 1 to 4 weeks ago. There has been no history of extremity trauma. The problem occurs constantly. The problem has been gradually worsening. The pain is at a severity of 6/10. The pain is moderate. Associated symptoms include an inability to bear weight, a limited range of motion, numbness, stiffness and tingling. Pertinent negatives include no fever, itching, joint locking or joint swelling. Associated symptoms comments: Patient did have partial shoulder replacement 15 years ago. The symptoms are aggravated by lying down. He has tried NSAIDS and acetaminophen for the symptoms. The treatment provided no relief. Family history does not include gout or rheumatoid arthritis. His past medical history is significant for diabetes and osteoarthritis. There is no history of gout or rheumatoid arthritis.    Left shoulder pain for the last few weeks. Numbness and tingling in fingers of both hands off and on.  No injury mechanize. Has had partial replacement surgery to left shoulder 15 years ago. He has had steroid injections in his shoulder in the past and states they have always worked well. He wishes to have one today.     Allergies  Allergen Reactions  . Codeine Sulfate   . Darvon [Propoxyphene Hcl] Other (See Comments)    Short of breath  . Demerol [Meperidine] Other (See Comments)    Short of breath   Previous Medications   ASPIRIN 81 MG TABLET    Take 81 mg by mouth daily.   DOXAZOSIN (CARDURA) 2 MG TABLET    TAKE ONE (1) TABLET BY MOUTH EVERY DAY   FLUTICASONE (FLONASE) 50 MCG/ACT NASAL SPRAY    USE ONE TO TWO SPRAYS IN EACH NOSTRIL  EVERY DAY   FUROSEMIDE (LASIX) 40 MG TABLET    TAKE ONE (1) TABLET BY MOUTH EVERY DAY   GABAPENTIN (NEURONTIN) 300 MG CAPSULE    Take 300 mg by mouth 3 (three) times daily. Reported on 09/13/2015   GLIMEPIRIDE (AMARYL) 1 MG TABLET    Take 1 mg by mouth daily before breakfast.   HYDROCODONE-ACETAMINOPHEN (NORCO/VICODIN) 5-325 MG PER TABLET    Take 1 tablet by mouth every 6 (six) hours as needed for moderate pain.   LORATADINE (CLARITIN) 10 MG TABLET    Take 10 mg by mouth daily.   MELOXICAM (MOBIC) 15 MG TABLET    TAKE ONE TABLET BY MOUTH DAILY AS NEEDED   METFORMIN (GLUCOPHAGE-XR) 500 MG 24 HR TABLET    Take 1 tablet (500 mg total) by mouth daily.   METHOCARBAMOL (ROBAXIN) 500 MG TABLET    Take 1 tablet (500 mg total) by mouth 3 (three) times daily as needed.   METOPROLOL TARTRATE (LOPRESSOR) 25 MG TABLET    Take 25 mg by mouth 2 (two) times daily. Reported on 09/13/2015   MULTIPLE VITAMIN (MULTIVITAMIN WITH MINERALS) TABS    Take 1 tablet by mouth daily.   OMEPRAZOLE (PRILOSEC) 20 MG CAPSULE    Take 20 mg by mouth daily.   POTASSIUM CHLORIDE SA (K-DUR,KLOR-CON) 20 MEQ TABLET    Take 20 mEq by mouth daily.   QUININE (QUALAQUIN) 324  MG CAPSULE    Take 648 mg by mouth every 8 (eight) hours. For leg cramps   SIMVASTATIN (ZOCOR) 40 MG TABLET    Take 40 mg by mouth every evening.   TRAZODONE (DESYREL) 100 MG TABLET    1/2 to 1 tablet at bedtime as needed for insomnia.    Review of Systems  Constitutional: Negative for fever, chills and appetite change.  Respiratory: Negative for chest tightness, shortness of breath and wheezing.   Cardiovascular: Negative for chest pain and palpitations.  Gastrointestinal: Negative for nausea, vomiting and abdominal pain.  Musculoskeletal: Positive for stiffness. Negative for gout.  Skin: Negative for itching.  Neurological: Positive for tingling and numbness.    Social History  Substance Use Topics  . Smoking status: Former Smoker -- 1.00 packs/day for 25 years     Types: Cigarettes    Quit date: 07/09/1968  . Smokeless tobacco: Not on file     Comment: quit at age 20  . Alcohol Use: No   Objective:   BP 128/70 mmHg  Pulse 97  Temp(Src) 98.1 F (36.7 C) (Oral)  Resp 16  Ht 5' 1.5" (1.562 m)  Wt 231 lb (104.781 kg)  BMI 42.95 kg/m2  SpO2 94%  Physical Exam  Positive impingement sign of left shoulder. Abduction limited to 80 degrees. Tender over acromium. No erythema.     Assessment & Plan:     1. Subacromial bursitis, left Prepped skin over acromium with sterile betadine. Injection methyl-prednisolone into subacromial space. Cleaned skin and applied adhesive bandage. Patient tolerated well. Advised to call for orthopedic referral if not greatly improved within 24 hours.  - methylPREDNISolone acetate (DEPO-MEDROL) injection 80 mg; Inject 1 mL (80 mg total) into the articular space once.       [name], MD  [facility]

## 2015-09-20 DIAGNOSIS — M1990 Unspecified osteoarthritis, unspecified site: Secondary | ICD-10-CM | POA: Diagnosis not present

## 2015-09-20 DIAGNOSIS — M25571 Pain in right ankle and joints of right foot: Secondary | ICD-10-CM | POA: Diagnosis not present

## 2015-10-03 ENCOUNTER — Other Ambulatory Visit: Payer: Self-pay | Admitting: Family Medicine

## 2015-10-03 DIAGNOSIS — I208 Other forms of angina pectoris: Secondary | ICD-10-CM | POA: Diagnosis not present

## 2015-10-03 DIAGNOSIS — I25709 Atherosclerosis of coronary artery bypass graft(s), unspecified, with unspecified angina pectoris: Secondary | ICD-10-CM | POA: Diagnosis not present

## 2015-10-03 DIAGNOSIS — J449 Chronic obstructive pulmonary disease, unspecified: Secondary | ICD-10-CM | POA: Diagnosis not present

## 2015-10-03 DIAGNOSIS — Z01818 Encounter for other preprocedural examination: Secondary | ICD-10-CM | POA: Diagnosis not present

## 2015-10-10 ENCOUNTER — Ambulatory Visit (INDEPENDENT_AMBULATORY_CARE_PROVIDER_SITE_OTHER): Payer: Medicare Other | Admitting: Family Medicine

## 2015-10-10 ENCOUNTER — Encounter: Payer: Self-pay | Admitting: Family Medicine

## 2015-10-10 VITALS — BP 130/68 | HR 81 | Temp 98.3°F | Resp 16 | Ht 61.5 in | Wt 232.0 lb

## 2015-10-10 DIAGNOSIS — Z79899 Other long term (current) drug therapy: Secondary | ICD-10-CM

## 2015-10-10 DIAGNOSIS — E785 Hyperlipidemia, unspecified: Secondary | ICD-10-CM | POA: Diagnosis not present

## 2015-10-10 DIAGNOSIS — R202 Paresthesia of skin: Secondary | ICD-10-CM | POA: Diagnosis not present

## 2015-10-10 DIAGNOSIS — E119 Type 2 diabetes mellitus without complications: Secondary | ICD-10-CM

## 2015-10-10 DIAGNOSIS — I10 Essential (primary) hypertension: Secondary | ICD-10-CM

## 2015-10-10 DIAGNOSIS — Z01818 Encounter for other preprocedural examination: Secondary | ICD-10-CM | POA: Diagnosis not present

## 2015-10-10 DIAGNOSIS — N183 Chronic kidney disease, stage 3 unspecified: Secondary | ICD-10-CM

## 2015-10-10 NOTE — Progress Notes (Signed)
Patient: Mark Terry. Male    DOB: [birth year]   81 y.o.   MRN: [id number] Visit Date: 10/10/2015  Today's Provider: [name], MD   No chief complaint on file.  Subjective:    HPI  Surgical clearance. Surgery is with Dr. Vickki Muff; Podiatry. Procedure: Arthrodesis, right ankle. He has had follow up with Dr. Clayborn Bigness for clearance and is scheduled for stress test 10-18-15.    Diabetes Mellitus Type II, Follow-up:   Lab Results  Component Value Date   HGBA1C 7.2 05/19/2015   HGBA1C 7.1 02/16/2015    Last seen for diabetes 5 months ago.  Management since then includes continuing same medicaitons He reports good compliance with treatment. He is not having side effects.    Home blood sugar records: fasting range: 140-160  Episodes of hypoglycemia? no    Most Recent Eye Exam: within the last year Weight trend: stable  Current exercise: no regular exercise  Pertinent Labs:    Component Value Date/Time   CREATININE 1.22 05/31/2012 0500    Wt Readings from Last 3 Encounters:  10/10/15 232 lb (105.235 kg)  09/13/15 231 lb (104.781 kg)  05/19/15 233 lb (105.688 kg)    ------------------------------------------------------------------------       Allergies  Allergen Reactions  . Codeine Sulfate   . Darvon [Propoxyphene Hcl] Other (See Comments)    Short of breath  . Demerol [Meperidine] Other (See Comments)    Short of breath   Previous Medications   ASPIRIN 81 MG TABLET    Take 81 mg by mouth daily.   DOXAZOSIN (CARDURA) 2 MG TABLET    TAKE ONE (1) TABLET BY MOUTH EVERY DAY   FLUTICASONE (FLONASE) 50 MCG/ACT NASAL SPRAY    USE ONE TO TWO SPRAYS IN EACH NOSTRIL EVERY DAY   FUROSEMIDE (LASIX) 40 MG TABLET    TAKE ONE (1) TABLET BY MOUTH EVERY DAY   GABAPENTIN (NEURONTIN) 300 MG CAPSULE    Take 300 mg by mouth 3 (three) times daily. Reported on 09/13/2015   GLIMEPIRIDE (AMARYL) 1 MG TABLET    Take 1 mg by mouth daily before breakfast.   HYDROCODONE-ACETAMINOPHEN (NORCO/VICODIN) 5-325 MG PER TABLET    Take 1 tablet by mouth every 6 (six) hours as needed for moderate pain.   LORATADINE (CLARITIN) 10 MG TABLET    Take 10 mg by mouth daily.   MELOXICAM (MOBIC) 15 MG TABLET    TAKE ONE TABLET BY MOUTH DAILY AS NEEDED   METFORMIN (GLUCOPHAGE-XR) 500 MG 24 HR TABLET    Take 1 tablet (500 mg total) by mouth daily.   METHOCARBAMOL (ROBAXIN) 500 MG TABLET    Take 1 tablet (500 mg total) by mouth 3 (three) times daily as needed.   METOPROLOL TARTRATE (LOPRESSOR) 25 MG TABLET    Take 25 mg by mouth 2 (two) times daily. Reported on 09/13/2015   MULTIPLE VITAMIN (MULTIVITAMIN WITH MINERALS) TABS    Take 1 tablet by mouth daily.   OMEPRAZOLE (PRILOSEC) 20 MG CAPSULE    TAKE ONE (1) CAPSULE BY MOUTH 2 TIMES DAILY   POTASSIUM CHLORIDE SA (K-DUR,KLOR-CON) 20 MEQ TABLET    Take 20 mEq by mouth daily.   QUININE (QUALAQUIN) 324 MG CAPSULE    Take 648 mg by mouth every 8 (eight) hours. For leg cramps   SIMVASTATIN (ZOCOR) 40 MG TABLET    TAKE ONE TABLET BY MOUTH EVERY NIGHT AT BEDTIME   TRAZODONE (DESYREL) 100 MG TABLET  1/2 to 1 tablet at bedtime as needed for insomnia.    Review of Systems  Constitutional: Negative for fever, chills and appetite change.  Respiratory: Negative for chest tightness, shortness of breath and wheezing.   Cardiovascular: Negative for chest pain and palpitations.  Gastrointestinal: Negative for nausea, vomiting and abdominal pain.  Neurological: Positive for numbness.       Numbness in both hands    Social History  Substance Use Topics  . Smoking status: Former Smoker -- 1.00 packs/day for 25 years    Types: Cigarettes    Quit date: 07/09/1968  . Smokeless tobacco: Not on file     Comment: quit at age 51  . Alcohol Use: No   Objective:   BP 130/68 mmHg  Pulse 81  Temp(Src) 98.3 F (36.8 C) (Oral)  Resp 16  Ht 5' 1.5" (1.562 m)  Wt 232 lb (105.235 kg)  BMI 43.13 kg/m2  SpO2 97%  Physical  Exam  General Appearance:    Alert, cooperative, no distress, obese  Eyes:    PERRL, conjunctiva/corneas clear, EOM's intact       Lungs:     Clear to auscultation bilaterally, respirations unlabored  Heart:    Regular rate and rhythm  Neurologic:   Awake, alert, oriented x 3. No apparent focal neurological           defect.   Ext:   2+ radial and ulnar pulses. Cap refill ~1 second.        Assessment & Plan:     1. Pre-op evaluation Cardiac clearance deferred to Dr. Clayborn Bigness. He has non-cardiac relative or absolute contraindications for surgery. If labs are stable I would consider him to be low risk for non-cardiac complications of surgery and GA.   2. Chronic kidney disease, stage 3 (moderate)  - Renal function panel - VITAMIN D 25 Hydroxy (Vit-D Deficiency, Fractures)  3. Controlled type 2 diabetes mellitus without complication, without long-term current use of insulin (HCC)  - Hemoglobin A1c - Vitamin B12  4. Essential (primary) hypertension Well controlled.  Continue current medications.    5. Paresthesia of both hands  - Vitamin B12 - CBC  6. HLD (hyperlipidemia) He is tolerating simvastatin well with no adverse effects.   - CBC - Hepatic function panel - Lipid Profile  7. Long-term use of high-risk medication  - Vitamin B12 - CBC       [name], MD  [facility]

## 2015-10-11 LAB — HEPATIC FUNCTION PANEL
ALK PHOS: 56 IU/L (ref 39–117)
ALT: 41 IU/L (ref 0–44)
AST: 29 IU/L (ref 0–40)
BILIRUBIN TOTAL: 0.4 mg/dL (ref 0.0–1.2)
Bilirubin, Direct: 0.17 mg/dL (ref 0.00–0.40)
TOTAL PROTEIN: 6.7 g/dL (ref 6.0–8.5)

## 2015-10-11 LAB — LIPID PANEL
CHOL/HDL RATIO: 2.7 ratio (ref 0.0–5.0)
Cholesterol, Total: 144 mg/dL (ref 100–199)
HDL: 54 mg/dL (ref >39)
LDL Calculated: 63 mg/dL (ref 0–99)
Triglycerides: 133 mg/dL (ref 0–149)
VLDL Cholesterol Cal: 27 mg/dL (ref 5–40)

## 2015-10-11 LAB — CBC
HEMOGLOBIN: 12.1 g/dL — AB (ref 12.6–17.7)
Hematocrit: 35.9 % — ABNORMAL LOW (ref 37.5–51.0)
MCH: 31.2 pg (ref 26.6–33.0)
MCHC: 33.7 g/dL (ref 31.5–35.7)
MCV: 93 fL (ref 79–97)
Platelets: 116 10*3/uL — ABNORMAL LOW (ref 150–379)
RBC: 3.88 x10E6/uL — AB (ref 4.14–5.80)
RDW: 14.5 % (ref 12.3–15.4)
WBC: 4.4 10*3/uL (ref 3.4–10.8)

## 2015-10-11 LAB — HEMOGLOBIN A1C
Est. average glucose Bld gHb Est-mCnc: 154 mg/dL
Hgb A1c MFr Bld: 7 % — ABNORMAL HIGH (ref 4.8–5.6)

## 2015-10-11 LAB — RENAL FUNCTION PANEL
ALBUMIN: 4.6 g/dL (ref 3.5–4.8)
BUN/Creatinine Ratio: 16 (ref 10–24)
BUN: 21 mg/dL (ref 8–27)
CHLORIDE: 105 mmol/L (ref 96–106)
CO2: 23 mmol/L (ref 18–29)
Calcium: 10 mg/dL (ref 8.6–10.2)
Creatinine, Ser: 1.3 mg/dL — ABNORMAL HIGH (ref 0.76–1.27)
GFR, EST AFRICAN AMERICAN: 60 mL/min/{1.73_m2} (ref >59)
GFR, EST NON AFRICAN AMERICAN: 52 mL/min/{1.73_m2} — AB (ref >59)
Glucose: 127 mg/dL — ABNORMAL HIGH (ref 65–99)
Phosphorus: 2.9 mg/dL (ref 2.5–4.5)
Potassium: 5.1 mmol/L (ref 3.5–5.2)
Sodium: 144 mmol/L (ref 134–144)

## 2015-10-11 LAB — VITAMIN D 25 HYDROXY (VIT D DEFICIENCY, FRACTURES): VIT D 25 HYDROXY: 47.6 ng/mL (ref 30.0–100.0)

## 2015-10-11 LAB — VITAMIN B12: Vitamin B-12: 728 pg/mL (ref 211–946)

## 2015-10-18 DIAGNOSIS — Z01818 Encounter for other preprocedural examination: Secondary | ICD-10-CM | POA: Diagnosis not present

## 2015-10-18 DIAGNOSIS — I208 Other forms of angina pectoris: Secondary | ICD-10-CM | POA: Diagnosis not present

## 2015-10-18 DIAGNOSIS — I25709 Atherosclerosis of coronary artery bypass graft(s), unspecified, with unspecified angina pectoris: Secondary | ICD-10-CM | POA: Diagnosis not present

## 2015-10-27 ENCOUNTER — Encounter
Admission: RE | Admit: 2015-10-27 | Discharge: 2015-10-27 | Disposition: A | Discharge status: Home / Self Care | Payer: Medicare Other | Source: Ambulatory Visit | Attending: Podiatry | Admitting: Podiatry

## 2015-10-27 DIAGNOSIS — Z01818 Encounter for other preprocedural examination: Secondary | ICD-10-CM | POA: Diagnosis not present

## 2015-10-27 DIAGNOSIS — M19071 Primary osteoarthritis, right ankle and foot: Secondary | ICD-10-CM | POA: Diagnosis not present

## 2015-10-27 HISTORY — DX: Panic disorder (episodic paroxysmal anxiety): F41.0

## 2015-10-27 HISTORY — DX: Anemia, unspecified: D64.9

## 2015-10-27 LAB — SURGICAL PCR SCREEN
MRSA, PCR: NEGATIVE
Staphylococcus aureus: NEGATIVE

## 2015-10-27 NOTE — Patient Instructions (Signed)
  Your procedure is scheduled on: 11/04/15 Report to Day Surgery. [facility]OND FLOOR To find out your arrival time please call [phone] between 1PM - 3PM on4/27/17  Remember: Instructions that are not followed completely may result in serious medical risk, up to and including death, or upon the discretion of your surgeon and anesthesiologist your surgery may need to be rescheduled.    __X__ 1. Do not eat food or drink liquids after midnight. No gum chewing or hard candies.     _X___ 2. No Alcohol for 24 hours before or after surgery.   ____ 3. Bring all medications with you on the day of surgery if instructed.    __X__ 4. Notify your doctor if there is any change in your medical condition     (cold, fever, infections).     Do not wear jewelry, make-up, hairpins, clips or nail polish.  Do not wear lotions, powders, or perfumes. You may wear deodorant.  Do not shave 48 hours prior to surgery. Men may shave face and neck.  Do not bring valuables to the hospital.    [facility] is not responsible for any belongings or valuables.               Contacts, dentures or bridgework may not be worn into surgery.  Leave your suitcase in the car. After surgery it may be brought to your room.  For patients admitted to the hospital, discharge time is determined by your                treatment team.   Patients discharged the day of surgery will not be allowed to drive home.   Please read over the following fact sheets that you were given:   Surgical Site Infection Prevention/ MRSA BOOKLET   _X___ Take these medicines the morning of surgery with A SIP OF WATER:    1. METOPROLOL  2. OMEPRAZOLE  3. GABAPENTIN  4.  5.  6.  ____ Fleet Enema (as directed)   _X___ Use CHG Soap as directed  ____ Use inhalers on the day of surgery  _X___ Stop metformin 2 days prior to surgery    ____ Take 1/2 of usual insulin dose the night before surgery and none on the morning of surgery.    __X__ Stop Coumadin/Plavix/aspirin on     STOP ASPIRIN AS INSTRUCTED 1 WEEK BEFORE SURGERY  __X__ Stop Anti-inflammatories on STOP MELOXICAM NOW ____ Stop supplements until after surgery.    ___X_ Bring C-Pap to the hospital.

## 2015-11-04 ENCOUNTER — Inpatient Hospital Stay: Payer: Medicare Other

## 2015-11-04 ENCOUNTER — Inpatient Hospital Stay
Admission: RE | Admit: 2015-11-04 | Discharge: 2015-11-07 | DRG: 493 | Disposition: A | Discharge status: Skilled Nursing Facility | Payer: Medicare Other | Source: Ambulatory Visit | Attending: Internal Medicine | Admitting: Internal Medicine

## 2015-11-04 ENCOUNTER — Encounter
Admission: RE | Disposition: A | Discharge status: Skilled Nursing Facility | Payer: Self-pay | Source: Ambulatory Visit | Attending: Internal Medicine

## 2015-11-04 ENCOUNTER — Encounter: Payer: Self-pay | Admitting: *Deleted

## 2015-11-04 ENCOUNTER — Inpatient Hospital Stay: Payer: Medicare Other | Admitting: Anesthesiology

## 2015-11-04 DIAGNOSIS — R2689 Other abnormalities of gait and mobility: Secondary | ICD-10-CM | POA: Diagnosis not present

## 2015-11-04 DIAGNOSIS — G8918 Other acute postprocedural pain: Secondary | ICD-10-CM | POA: Diagnosis not present

## 2015-11-04 DIAGNOSIS — Z7984 Long term (current) use of oral hypoglycemic drugs: Secondary | ICD-10-CM | POA: Diagnosis not present

## 2015-11-04 DIAGNOSIS — R252 Cramp and spasm: Secondary | ICD-10-CM | POA: Diagnosis not present

## 2015-11-04 DIAGNOSIS — Z8249 Family history of ischemic heart disease and other diseases of the circulatory system: Secondary | ICD-10-CM | POA: Diagnosis not present

## 2015-11-04 DIAGNOSIS — R079 Chest pain, unspecified: Secondary | ICD-10-CM

## 2015-11-04 DIAGNOSIS — Z820 Family history of epilepsy and other diseases of the nervous system: Secondary | ICD-10-CM | POA: Diagnosis not present

## 2015-11-04 DIAGNOSIS — Z8711 Personal history of peptic ulcer disease: Secondary | ICD-10-CM | POA: Diagnosis not present

## 2015-11-04 DIAGNOSIS — N189 Chronic kidney disease, unspecified: Secondary | ICD-10-CM | POA: Diagnosis not present

## 2015-11-04 DIAGNOSIS — Z7982 Long term (current) use of aspirin: Secondary | ICD-10-CM

## 2015-11-04 DIAGNOSIS — Z85820 Personal history of malignant melanoma of skin: Secondary | ICD-10-CM

## 2015-11-04 DIAGNOSIS — E785 Hyperlipidemia, unspecified: Secondary | ICD-10-CM | POA: Diagnosis present

## 2015-11-04 DIAGNOSIS — Z951 Presence of aortocoronary bypass graft: Secondary | ICD-10-CM

## 2015-11-04 DIAGNOSIS — Z96611 Presence of right artificial shoulder joint: Secondary | ICD-10-CM | POA: Diagnosis present

## 2015-11-04 DIAGNOSIS — M6281 Muscle weakness (generalized): Secondary | ICD-10-CM | POA: Diagnosis not present

## 2015-11-04 DIAGNOSIS — K5900 Constipation, unspecified: Secondary | ICD-10-CM | POA: Diagnosis not present

## 2015-11-04 DIAGNOSIS — I25119 Atherosclerotic heart disease of native coronary artery with unspecified angina pectoris: Secondary | ICD-10-CM | POA: Diagnosis present

## 2015-11-04 DIAGNOSIS — G4733 Obstructive sleep apnea (adult) (pediatric): Secondary | ICD-10-CM | POA: Diagnosis not present

## 2015-11-04 DIAGNOSIS — M19071 Primary osteoarthritis, right ankle and foot: Principal | ICD-10-CM | POA: Diagnosis present

## 2015-11-04 DIAGNOSIS — E1122 Type 2 diabetes mellitus with diabetic chronic kidney disease: Secondary | ICD-10-CM | POA: Diagnosis not present

## 2015-11-04 DIAGNOSIS — I129 Hypertensive chronic kidney disease with stage 1 through stage 4 chronic kidney disease, or unspecified chronic kidney disease: Secondary | ICD-10-CM | POA: Diagnosis not present

## 2015-11-04 DIAGNOSIS — G4730 Sleep apnea, unspecified: Secondary | ICD-10-CM | POA: Diagnosis present

## 2015-11-04 DIAGNOSIS — D649 Anemia, unspecified: Secondary | ICD-10-CM | POA: Diagnosis present

## 2015-11-04 DIAGNOSIS — Z96641 Presence of right artificial hip joint: Secondary | ICD-10-CM | POA: Diagnosis present

## 2015-11-04 DIAGNOSIS — K219 Gastro-esophageal reflux disease without esophagitis: Secondary | ICD-10-CM | POA: Diagnosis present

## 2015-11-04 DIAGNOSIS — M5136 Other intervertebral disc degeneration, lumbar region: Secondary | ICD-10-CM | POA: Diagnosis not present

## 2015-11-04 DIAGNOSIS — Z7409 Other reduced mobility: Secondary | ICD-10-CM | POA: Diagnosis not present

## 2015-11-04 DIAGNOSIS — Z8614 Personal history of Methicillin resistant Staphylococcus aureus infection: Secondary | ICD-10-CM

## 2015-11-04 DIAGNOSIS — M4802 Spinal stenosis, cervical region: Secondary | ICD-10-CM | POA: Diagnosis not present

## 2015-11-04 DIAGNOSIS — J309 Allergic rhinitis, unspecified: Secondary | ICD-10-CM | POA: Diagnosis not present

## 2015-11-04 DIAGNOSIS — M25571 Pain in right ankle and joints of right foot: Secondary | ICD-10-CM | POA: Diagnosis not present

## 2015-11-04 DIAGNOSIS — I130 Hypertensive heart and chronic kidney disease with heart failure and stage 1 through stage 4 chronic kidney disease, or unspecified chronic kidney disease: Secondary | ICD-10-CM | POA: Diagnosis present

## 2015-11-04 DIAGNOSIS — J449 Chronic obstructive pulmonary disease, unspecified: Secondary | ICD-10-CM | POA: Diagnosis present

## 2015-11-04 DIAGNOSIS — I509 Heart failure, unspecified: Secondary | ICD-10-CM | POA: Diagnosis present

## 2015-11-04 DIAGNOSIS — Z87891 Personal history of nicotine dependence: Secondary | ICD-10-CM | POA: Diagnosis not present

## 2015-11-04 DIAGNOSIS — M19079 Primary osteoarthritis, unspecified ankle and foot: Secondary | ICD-10-CM | POA: Diagnosis present

## 2015-11-04 DIAGNOSIS — Z96653 Presence of artificial knee joint, bilateral: Secondary | ICD-10-CM | POA: Diagnosis present

## 2015-11-04 DIAGNOSIS — M1990 Unspecified osteoarthritis, unspecified site: Secondary | ICD-10-CM | POA: Diagnosis not present

## 2015-11-04 DIAGNOSIS — Z79899 Other long term (current) drug therapy: Secondary | ICD-10-CM

## 2015-11-04 DIAGNOSIS — E119 Type 2 diabetes mellitus without complications: Secondary | ICD-10-CM | POA: Diagnosis not present

## 2015-11-04 DIAGNOSIS — R293 Abnormal posture: Secondary | ICD-10-CM | POA: Diagnosis not present

## 2015-11-04 DIAGNOSIS — Z9989 Dependence on other enabling machines and devices: Secondary | ICD-10-CM | POA: Diagnosis not present

## 2015-11-04 DIAGNOSIS — R0789 Other chest pain: Secondary | ICD-10-CM | POA: Diagnosis not present

## 2015-11-04 HISTORY — PX: ANKLE FUSION: SHX881

## 2015-11-04 LAB — GLUCOSE, CAPILLARY
Glucose-Capillary: 161 mg/dL — ABNORMAL HIGH (ref 65–99)
Glucose-Capillary: 139 mg/dL — ABNORMAL HIGH (ref 65–99)
Glucose-Capillary: 181 mg/dL — ABNORMAL HIGH (ref 65–99)
Glucose-Capillary: 200 mg/dL — ABNORMAL HIGH (ref 65–99)

## 2015-11-04 LAB — CBC
HCT: 34.8 % — ABNORMAL LOW (ref 40.0–52.0)
Hemoglobin: 11.9 g/dL — ABNORMAL LOW (ref 13.0–18.0)
MCH: 31.2 pg (ref 26.0–34.0)
MCHC: 34.2 g/dL (ref 32.0–36.0)
MCV: 91.3 fL (ref 80.0–100.0)
Platelets: 126 10*3/uL — ABNORMAL LOW (ref 150–440)
RBC: 3.81 MIL/uL — ABNORMAL LOW (ref 4.40–5.90)
RDW: 14.5 % (ref 11.5–14.5)
WBC: 5.5 10*3/uL (ref 3.8–10.6)

## 2015-11-04 LAB — HEMOGLOBIN A1C: HEMOGLOBIN A1C: 6.9 % — AB (ref 4.0–6.0)

## 2015-11-04 LAB — CREATININE, SERUM
Creatinine, Ser: 1.38 mg/dL — ABNORMAL HIGH (ref 0.61–1.24)
GFR calc Af Amer: 55 mL/min — ABNORMAL LOW (ref >60)
GFR calc non Af Amer: 47 mL/min — ABNORMAL LOW (ref >60)

## 2015-11-04 SURGERY — ARTHRODESIS ANKLE
Anesthesia: Regional | Laterality: Right | Wound class: Clean Contaminated

## 2015-11-04 MED ORDER — HYDROCODONE-ACETAMINOPHEN 5-325 MG PO TABS
1.0000 | ORAL_TABLET | Freq: Four times a day (QID) | ORAL | Status: DC | PRN
  Administered 2015-11-04 – 2015-11-07 (×5): 1 via ORAL
  Filled 2015-11-04 (×5): qty 1

## 2015-11-04 MED ORDER — HYDRALAZINE HCL 20 MG/ML IJ SOLN
10.0000 mg | INTRAMUSCULAR | Status: DC | PRN
Start: 2015-11-04 — End: 2015-11-07

## 2015-11-04 MED ORDER — MIDAZOLAM HCL 2 MG/2ML IJ SOLN
INTRAMUSCULAR | Status: DC | PRN
  Administered 2015-11-04 (×2): 1 mg via INTRAVENOUS

## 2015-11-04 MED ORDER — INSULIN ASPART 100 UNIT/ML ~~LOC~~ SOLN
0.0000 [IU] | Freq: Three times a day (TID) | SUBCUTANEOUS | Status: DC
  Administered 2015-11-04 – 2015-11-05 (×2): 2 [IU] via SUBCUTANEOUS
  Administered 2015-11-05 – 2015-11-06 (×2): 3 [IU] via SUBCUTANEOUS
  Administered 2015-11-06: 2 [IU] via SUBCUTANEOUS
  Administered 2015-11-06 – 2015-11-07 (×3): 1 [IU] via SUBCUTANEOUS
  Filled 2015-11-04: qty 3
  Filled 2015-11-04 (×2): qty 2
  Filled 2015-11-04 (×2): qty 1
  Filled 2015-11-04: qty 3
  Filled 2015-11-04 (×2): qty 2
  Filled 2015-11-04: qty 3

## 2015-11-04 MED ORDER — TRAZODONE HCL 100 MG PO TABS
100.0000 mg | ORAL_TABLET | Freq: Every evening | ORAL | Status: DC | PRN
  Administered 2015-11-04: 100 mg via ORAL
  Filled 2015-11-04: qty 1

## 2015-11-04 MED ORDER — ENOXAPARIN SODIUM 40 MG/0.4ML ~~LOC~~ SOLN
40.0000 mg | SUBCUTANEOUS | Status: DC
  Administered 2015-11-05 – 2015-11-07 (×3): 40 mg via SUBCUTANEOUS
  Filled 2015-11-04 (×3): qty 0.4

## 2015-11-04 MED ORDER — DOXAZOSIN MESYLATE 2 MG PO TABS
2.0000 mg | ORAL_TABLET | Freq: Every day | ORAL | Status: DC
  Administered 2015-11-04 – 2015-11-07 (×4): 2 mg via ORAL
  Filled 2015-11-04 (×5): qty 1

## 2015-11-04 MED ORDER — LORATADINE 10 MG PO TABS
10.0000 mg | ORAL_TABLET | Freq: Every day | ORAL | Status: DC
  Administered 2015-11-04 – 2015-11-07 (×4): 10 mg via ORAL
  Filled 2015-11-04 (×4): qty 1

## 2015-11-04 MED ORDER — METHOCARBAMOL 500 MG PO TABS
500.0000 mg | ORAL_TABLET | Freq: Three times a day (TID) | ORAL | Status: DC | PRN
  Administered 2015-11-04 – 2015-11-05 (×2): 500 mg via ORAL
  Filled 2015-11-04 (×2): qty 1

## 2015-11-04 MED ORDER — CEFAZOLIN SODIUM-DEXTROSE 2-4 GM/100ML-% IV SOLN
INTRAVENOUS | Status: AC
  Administered 2015-11-04: 2 g via INTRAVENOUS
  Filled 2015-11-04: qty 100

## 2015-11-04 MED ORDER — FENTANYL CITRATE (PF) 100 MCG/2ML IJ SOLN: 25.0000 ug | INTRAMUSCULAR | Status: DC | PRN

## 2015-11-04 MED ORDER — HYDROMORPHONE HCL 1 MG/ML IJ SOLN
0.5000 mg | INTRAMUSCULAR | Status: DC | PRN
  Administered 2015-11-04 (×3): 0.5 mg via INTRAVENOUS
  Filled 2015-11-04 (×2): qty 1

## 2015-11-04 MED ORDER — FUROSEMIDE 40 MG PO TABS
40.0000 mg | ORAL_TABLET | Freq: Every day | ORAL | Status: DC
  Administered 2015-11-07: 40 mg via ORAL
  Filled 2015-11-04 (×4): qty 1

## 2015-11-04 MED ORDER — METOPROLOL TARTRATE 25 MG PO TABS
25.0000 mg | ORAL_TABLET | Freq: Two times a day (BID) | ORAL | Status: DC
  Administered 2015-11-04 – 2015-11-07 (×5): 25 mg via ORAL
  Filled 2015-11-04 (×6): qty 1

## 2015-11-04 MED ORDER — PANTOPRAZOLE SODIUM 40 MG PO TBEC
40.0000 mg | DELAYED_RELEASE_TABLET | Freq: Every day | ORAL | Status: DC
  Administered 2015-11-05 – 2015-11-07 (×3): 40 mg via ORAL
  Filled 2015-11-04 (×3): qty 1

## 2015-11-04 MED ORDER — FAMOTIDINE 20 MG PO TABS
ORAL_TABLET | ORAL | Status: AC
  Filled 2015-11-04: qty 1

## 2015-11-04 MED ORDER — ASPIRIN EC 81 MG PO TBEC
81.0000 mg | DELAYED_RELEASE_TABLET | Freq: Every day | ORAL | Status: DC
  Administered 2015-11-05 – 2015-11-07 (×3): 81 mg via ORAL
  Filled 2015-11-04 (×3): qty 1

## 2015-11-04 MED ORDER — POTASSIUM CHLORIDE CRYS ER 20 MEQ PO TBCR
20.0000 meq | EXTENDED_RELEASE_TABLET | Freq: Every day | ORAL | Status: DC
  Administered 2015-11-04 – 2015-11-07 (×4): 20 meq via ORAL
  Filled 2015-11-04 (×4): qty 1

## 2015-11-04 MED ORDER — ROPIVACAINE HCL 5 MG/ML IJ SOLN
INTRAMUSCULAR | Status: DC | PRN
  Administered 2015-11-04: 30 mL via EPIDURAL

## 2015-11-04 MED ORDER — PROPOFOL 10 MG/ML IV BOLUS
INTRAVENOUS | Status: DC | PRN
  Administered 2015-11-04: 150 mg via INTRAVENOUS

## 2015-11-04 MED ORDER — SIMVASTATIN 40 MG PO TABS
40.0000 mg | ORAL_TABLET | Freq: Every day | ORAL | Status: DC
  Administered 2015-11-04 – 2015-11-06 (×3): 40 mg via ORAL
  Filled 2015-11-04 (×3): qty 1

## 2015-11-04 MED ORDER — BUPIVACAINE HCL (PF) 0.25 % IJ SOLN
INTRAMUSCULAR | Status: AC
  Filled 2015-11-04: qty 30

## 2015-11-04 MED ORDER — SODIUM CHLORIDE 0.9 % IV SOLN
INTRAVENOUS | Status: DC
  Administered 2015-11-04: 09:00:00 via INTRAVENOUS

## 2015-11-04 MED ORDER — NYSTATIN 100000 UNIT/GM EX POWD
1.0000 g | Freq: Two times a day (BID) | CUTANEOUS | Status: DC
  Administered 2015-11-04 – 2015-11-07 (×6): 1 g via TOPICAL
  Filled 2015-11-04: qty 15

## 2015-11-04 MED ORDER — ADULT MULTIVITAMIN W/MINERALS CH
1.0000 | ORAL_TABLET | Freq: Every day | ORAL | Status: DC
  Administered 2015-11-05 – 2015-11-07 (×3): 1 via ORAL
  Filled 2015-11-04 (×3): qty 1

## 2015-11-04 MED ORDER — LIDOCAINE HCL (PF) 1 % IJ SOLN
INTRAMUSCULAR | Status: DC | PRN
  Administered 2015-11-04: 10 mL

## 2015-11-04 MED ORDER — QUININE SULFATE 324 MG PO CAPS
648.0000 mg | ORAL_CAPSULE | Freq: Three times a day (TID) | ORAL | Status: DC
  Administered 2015-11-05 – 2015-11-07 (×7): 648 mg via ORAL
  Filled 2015-11-04 (×14): qty 2

## 2015-11-04 MED ORDER — CEFAZOLIN SODIUM-DEXTROSE 2-4 GM/100ML-% IV SOLN
2.0000 g | Freq: Once | INTRAVENOUS | Status: AC
  Administered 2015-11-04: 2 g via INTRAVENOUS

## 2015-11-04 MED ORDER — METFORMIN HCL ER 500 MG PO TB24
500.0000 mg | ORAL_TABLET | Freq: Every day | ORAL | Status: DC
  Administered 2015-11-05 – 2015-11-07 (×3): 500 mg via ORAL
  Filled 2015-11-04 (×3): qty 1

## 2015-11-04 MED ORDER — BUPIVACAINE HCL (PF) 0.5 % IJ SOLN
INTRAMUSCULAR | Status: AC
  Filled 2015-11-04: qty 30

## 2015-11-04 MED ORDER — INSULIN ASPART 100 UNIT/ML ~~LOC~~ SOLN
0.0000 [IU] | Freq: Every day | SUBCUTANEOUS | Status: DC
  Administered 2015-11-05: 2 [IU] via SUBCUTANEOUS
  Filled 2015-11-04: qty 2

## 2015-11-04 MED ORDER — ONDANSETRON HCL 4 MG/2ML IJ SOLN: 4.0000 mg | Freq: Once | INTRAMUSCULAR | Status: DC | PRN

## 2015-11-04 MED ORDER — LIDOCAINE HCL (PF) 1 % IJ SOLN
INTRAMUSCULAR | Status: AC
  Filled 2015-11-04: qty 30

## 2015-11-04 MED ORDER — GLIMEPIRIDE 2 MG PO TABS
1.0000 mg | ORAL_TABLET | Freq: Every day | ORAL | Status: DC
  Administered 2015-11-05 – 2015-11-07 (×3): 1 mg via ORAL
  Filled 2015-11-04 (×3): qty 1

## 2015-11-04 MED ORDER — FENTANYL CITRATE (PF) 100 MCG/2ML IJ SOLN
INTRAMUSCULAR | Status: DC | PRN
  Administered 2015-11-04 (×2): 25 ug via INTRAVENOUS
  Administered 2015-11-04: 50 ug via INTRAVENOUS

## 2015-11-04 MED ORDER — GABAPENTIN 300 MG PO CAPS
300.0000 mg | ORAL_CAPSULE | Freq: Two times a day (BID) | ORAL | Status: DC
  Administered 2015-11-04 – 2015-11-07 (×6): 300 mg via ORAL
  Filled 2015-11-04 (×6): qty 1

## 2015-11-04 MED ORDER — FLUTICASONE PROPIONATE 50 MCG/ACT NA SUSP
1.0000 | Freq: Every day | NASAL | Status: DC
  Administered 2015-11-05 – 2015-11-06 (×2): 1 via NASAL
  Filled 2015-11-04: qty 16

## 2015-11-04 MED ORDER — ONDANSETRON HCL 4 MG/2ML IJ SOLN
INTRAMUSCULAR | Status: DC | PRN
  Administered 2015-11-04: 4 mg via INTRAVENOUS

## 2015-11-04 MED ORDER — ROPIVACAINE HCL 5 MG/ML IJ SOLN
INTRAMUSCULAR | Status: AC
  Filled 2015-11-04: qty 40

## 2015-11-04 MED ORDER — AMOXICILLIN 500 MG PO CAPS
500.0000 mg | ORAL_CAPSULE | Freq: Every day | ORAL | Status: DC
  Administered 2015-11-04 – 2015-11-07 (×4): 500 mg via ORAL
  Filled 2015-11-04 (×5): qty 1

## 2015-11-04 SURGICAL SUPPLY — 51 items
BAG COUNTER SPONGE EZ (MISCELLANEOUS) ×2 IMPLANT
BANDAGE ELASTIC 4 LF NS (GAUZE/BANDAGES/DRESSINGS) ×4 IMPLANT
BANDAGE STRETCH 3X4.1 STRL (GAUZE/BANDAGES/DRESSINGS) ×2 IMPLANT
BIT DRILL 4.8X200 CANN (BIT) ×2 IMPLANT
BNDG CMPR 75X21 PLY HI ABS (MISCELLANEOUS) ×1
BNDG CMPR 75X41 PLY HI ABS (GAUZE/BANDAGES/DRESSINGS) ×1
BNDG COHESIVE 4X5 TAN STRL (GAUZE/BANDAGES/DRESSINGS) ×2 IMPLANT
BNDG ESMARK 4X12 TAN STRL LF (GAUZE/BANDAGES/DRESSINGS) ×2 IMPLANT
BNDG GAUZE 4.5X4.1 6PLY STRL (MISCELLANEOUS) ×4 IMPLANT
BNDG STRETCH 4X75 STRL LF (GAUZE/BANDAGES/DRESSINGS) ×2 IMPLANT
BONE MATRIX TRINITY 10CC (Bone Implant) ×2 IMPLANT
BUR 4X45 EGG (BURR) ×2 IMPLANT
CANISTER SUCT 1200ML W/VALVE (MISCELLANEOUS) ×2 IMPLANT
DRAPE C-ARM XRAY 36X54 (DRAPES) ×2 IMPLANT
DRAPE C-ARMOR (DRAPES) ×4 IMPLANT
DURAPREP 26ML APPLICATOR (WOUND CARE) ×2 IMPLANT
ELECT REM PT RETURN 9FT ADLT (ELECTROSURGICAL) ×2
ELECTRODE REM PT RTRN 9FT ADLT (ELECTROSURGICAL) ×1 IMPLANT
GAUZE PETRO XEROFOAM 1X8 (MISCELLANEOUS) ×6 IMPLANT
GAUZE SPONGE 4X4 12PLY STRL (GAUZE/BANDAGES/DRESSINGS) ×4 IMPLANT
GAUZE STRETCH 2X75IN STRL (MISCELLANEOUS) ×2 IMPLANT
GLOVE BIO SURGEON STRL SZ7.5 (GLOVE) ×2 IMPLANT
GLOVE INDICATOR 8.0 STRL GRN (GLOVE) ×2 IMPLANT
GOWN STRL REUS W/ TWL LRG LVL3 (GOWN DISPOSABLE) ×2 IMPLANT
GOWN STRL REUS W/TWL LRG LVL3 (GOWN DISPOSABLE) ×4
LABEL OR SOLS (LABEL) ×2 IMPLANT
NS IRRIG 500ML POUR BTL (IV SOLUTION) ×2 IMPLANT
PACK EXTREMITY ARMC (MISCELLANEOUS) ×2 IMPLANT
PAD ABD DERMACEA PRESS 5X9 (GAUZE/BANDAGES/DRESSINGS) ×4 IMPLANT
PAD PREP 24X41 OB/GYN DISP (PERSONAL CARE ITEMS) ×2 IMPLANT
PENCIL ELECTRO HAND CTR (MISCELLANEOUS) ×2 IMPLANT
PIN GUIDE DRILL TIP 2.8X300 (DRILL) ×4 IMPLANT
SCREW CANN THRD 8X55 (Screw) ×1 IMPLANT
SCREW CANNULATED .5X35 KNEE (Screw) ×2 IMPLANT
SCREW CANNULATED 8.0X45MM (Screw) ×2 IMPLANT
SCREW CANNULATED 8.0X55MM (Screw) ×2 IMPLANT
SPLINT CAST 1 STEP 4X30 (MISCELLANEOUS) ×2 IMPLANT
SPLINT FAST PLASTER 5X30 (CAST SUPPLIES) ×1
SPLINT PLASTER CAST FAST 5X30 (CAST SUPPLIES) ×1 IMPLANT
SPONGE LAP 18X18 5 PK (GAUZE/BANDAGES/DRESSINGS) ×2 IMPLANT
STAPLER SKIN PROX 35W (STAPLE) ×2 IMPLANT
STIMULATOR BONE (ORTHOPEDIC SUPPLIES) ×1
STIMULATOR BONE GROWTH EMG EXT (ORTHOPEDIC SUPPLIES) ×1 IMPLANT
STOCKINETTE M/LG 89821 (MISCELLANEOUS) ×2 IMPLANT
STRAP SAFETY BODY (MISCELLANEOUS) ×2 IMPLANT
SUT VIC AB 2-0 CT1 27 (SUTURE) ×2
SUT VIC AB 2-0 CT1 TAPERPNT 27 (SUTURE) ×1 IMPLANT
SUT VIC AB 3-0 SH 27 (SUTURE) ×2
SUT VIC AB 3-0 SH 27X BRD (SUTURE) ×1 IMPLANT
TOWEL OR 17X26 4PK STRL BLUE (TOWEL DISPOSABLE) ×2 IMPLANT
WIRE Z .062 C-WIRE SPADE TIP (WIRE) ×16 IMPLANT

## 2015-11-04 NOTE — Op Note (Signed)
Operative note   Surgeon:Guy Toney    Assistant:none    Preop diagnosis:Right ankle degenerative arthritis    Postop diagnosis:same    Procedure:Right ankle arthrodesis    [id number]    Anesthesia:regional and general    Hemostasis:Thigh tourniquet inflated to 325 mmHg for 120 minutes    Specimen: None    Complications: None    Operative indications:Mark Terry. is an 81 y.o. that presents today for surgical intervention.  The risks/benefits/alternatives/complications have been discussed and consent has been given.    Procedure:  Patient was brought into the OR and placed on the operating table in thesupine position. After anesthesia was obtained theright lower extremity was prepped and draped in usual sterile fashion.after inflation of the tourniquet attention was directed to the anterolateral aspect of the ankle where a curved incision was made beginning approximately 10 cm proximal to the ankle joint ending 5 cm distal to the ankle joint overlying the fibular region. Sharp and blunt dissection carried down to the fibula. Next capsular dissection was carried across the anterior aspect of the ankle joint exposing the ankle joint completely. A second incision was placed on the anteromedial aspect of the ankle with sharp and blunt dissection carried down to the capsule. The capsule was then exposed and the anterior medial aspect of the ankle joint was exposed. There was noted be severe degenerative changes with minimal articular cartilage throughout the ankle joint. A combination of curettes, osteotomes, burs were used to remove any residual cartilage and taken down to the subchondral bone plate. Good healthy bleeding of the bone was noted after final preparation of the ankle joint. Slight removal of the lateral aspect of the ankle joint was performed to correct a slight varus deformity that he had. The subchondral bone plate was then drilled with a 2.0 mm drill bit through the  subchondral bone plate with excellent bleeding of the bone and bone graft created. Next Trinity bone graft was then placed. 10 cc of Trinity bone graft were used. This was then stabilized with a guidewire for an 8.0 mm Depew Biomet screw. Initially placed through the lateral talar process crossing the ankle joint. A second screw was placed from the lateral superior aspect of the tibia across the ankle joint. A third screw was placed through the medial malleolus into the tibiotalar joint. These were all a 0.0 mm screws from the Biomet screw set. Excellent compression was noted. Good alignment was noted in all views.  The wound was flushed with copious amount of irrigation and layered skin closure was performed with a 20 and 3-0 Vicryl and skin staples for the skin. A large bulky well-padded dressing was applied to the right lower extremity and the patient was placed just posterior splint. He will be admitted to the floor for observation of postoperative pain as well as likely plan skilled nursing placement as I don't believe he will be able to perform normal activities of daily living.   Patient tolerated the procedure and anesthesia well.  Was transported from the OR to the PACU with all vital signs stable and vascular status intact. To be discharged per routine protocol.  Will follow up in approximately 1 week in the outpatient clinic.

## 2015-11-04 NOTE — Anesthesia Postprocedure Evaluation (Signed)
Anesthesia Post Note  Patient: Xzavien Santora.  Procedure(s) Performed: Procedure(s) (LRB): ARTHRODESIS ANKLE (Right)  Patient location during evaluation: PACU Anesthesia Type: General and Regional Level of consciousness: awake and alert Pain management: pain level controlled Vital Signs Assessment: post-procedure vital signs reviewed and stable Respiratory status: spontaneous breathing, nonlabored ventilation, respiratory function stable and patient connected to nasal cannula oxygen Cardiovascular status: blood pressure returned to baseline and stable Postop Assessment: no signs of nausea or vomiting Anesthetic complications: no    Last Vitals:  Filed Vitals:   11/04/15 1826 11/04/15 1924  BP: 119/57 133/52  Pulse: 102 103  Temp: 37.3 C 37.2 C  Resp: 18 22    Last Pain:  Filed Vitals:   11/04/15 1925  PainSc: 2                  Martha Clan

## 2015-11-04 NOTE — Progress Notes (Signed)
Pt is red in his groin. Dr hower gave order for nystatin powder

## 2015-11-04 NOTE — NC FL2 (Signed)
[place] LEVEL OF CARE SCREENING TOOL     IDENTIFICATION  Patient Name: Mark Terry. Birthdate: 13-Aug-1934 Sex: male Admission Date (Current Location): 11/04/2015  [place] and [place] Number:  Engineering geologist and Address:  [facility], [address] [place], [place] [address]      Provider Number: [id number]  Attending Physician Name and Address:  Samara Deist, DPM  Relative Name and Phone Number:       Current Level of Care: Hospital Recommended Level of Care: [place] Prior Approval Number:    Date Approved/Denied:   PASRR Number:  ([id number])  Discharge Plan: SNF    Current Diagnoses: Patient Active Problem List   Diagnosis Date Noted  . Arthritis of ankle or foot, degenerative 11/04/2015  . Allergic rhinitis 12/20/2014  . Angina pectoris ([place]) 12/20/2014  . Arthritis 12/20/2014  . At risk for falling 12/20/2014  . Back pain with radiation 12/20/2014  . BPH (benign prostatic hyperplasia) 12/20/2014  . Carpal tunnel syndrome 12/20/2014  . Congestive heart failure ([place]) 12/20/2014  . Fatty infiltration of liver 12/20/2014  . History of colonic polyps 12/20/2014  . Decreased potassium in the blood 12/20/2014  . Cervical pain 12/20/2014  . Skin lesion 12/20/2014  . Change in blood platelet count 12/20/2014  . Arthritis of ankle, right 12/20/2014  . Varus foot deformity, acquired 12/20/2014  . Insomnia   . Cervical spinal stenosis 04/28/2014  . DDD (degenerative disc disease), cervical 04/28/2014  . Cervical radiculitis 04/01/2014  . Neuritis or radiculitis due to rupture of lumbar intervertebral disc 12/21/2013  . Chronic gastritis 12/21/2010  . Pilonidal cyst 09/05/2009  . Obstructive apnea 05/07/2005  . Diabetes mellitus, type 2 ([place]) 04/22/2003  . Atherosclerosis of coronary artery 10/05/1999  . Essential (primary) hypertension 09/06/1998  . Arthropathia 03/25/1996  . HLD  (hyperlipidemia) 03/25/1996  . Morbid obesity ([place]) 09/21/1994  . GERD (gastroesophageal reflux disease) 08/10/1994    Orientation RESPIRATION BLADDER Height & Weight     Self, Time, Situation, Place  O2 (2 Liters Oxygen ) Continent Weight: 223 lb (101.152 kg) Height:  5' 1.5" (156.2 cm)  BEHAVIORAL SYMPTOMS/MOOD NEUROLOGICAL BOWEL NUTRITION STATUS   (none )  (none ) Continent Diet (Diet: Carb Modified )  AMBULATORY STATUS COMMUNICATION OF NEEDS Skin   Extensive Assist Verbally Surgical wounds (Incision: Right Foot. )                       Personal Care Assistance Level of Assistance  Bathing, Feeding, Dressing Bathing Assistance: Limited assistance Feeding assistance: Independent Dressing Assistance: Limited assistance     Functional Limitations Info  Sight, Hearing, Speech Sight Info: Adequate Hearing Info: Adequate Speech Info: Adequate    SPECIAL CARE FACTORS FREQUENCY  PT (By licensed PT), OT (By licensed OT)     PT Frequency:  (5) OT Frequency:  (5)            Contractures      Additional Factors Info  Code Status, Allergies, Isolation Precautions Code Status Info:  (Full Code. ) Allergies Info:  (Codeine Sulfate, Darvon, Demerol)     Isolation Precautions Info:  (MRSA Surgical PCR)     Current Medications (11/04/2015):  This is the current hospital active medication list Current Facility-Administered Medications  Medication Dose Route Frequency Provider Last Rate Last Dose  . amoxicillin (AMOXIL) capsule 500 mg  500 mg Oral Daily Samara Deist, DPM      .  aspirin EC tablet 81 mg  81 mg Oral Daily Samara Deist, DPM      . doxazosin (CARDURA) tablet 2 mg  2 mg Oral Daily Samara Deist, DPM      . Derrill Memo ON 11/05/2015] enoxaparin (LOVENOX) injection 40 mg  40 mg Subcutaneous Q24H Samara Deist, DPM      . fluticasone (FLONASE) 50 MCG/ACT nasal spray 1 spray  1 spray Each Nare Daily Samara Deist, DPM      . furosemide (LASIX) tablet 40 mg  40 mg Oral  Daily Samara Deist, DPM      . gabapentin (NEURONTIN) capsule 300 mg  300 mg Oral BID Samara Deist, DPM      . Derrill Memo ON 11/05/2015] glimepiride (AMARYL) tablet 1 mg  1 mg Oral QAC breakfast Samara Deist, DPM      . HYDROcodone-acetaminophen (NORCO/VICODIN) 5-325 MG per tablet 1 tablet  1 tablet Oral Q6H PRN Samara Deist, DPM      . HYDROmorphone (DILAUDID) injection 0.5 mg  0.5 mg Intravenous Q2H PRN Samara Deist, DPM      . insulin aspart (novoLOG) injection 0-5 Units  0-5 Units Subcutaneous QHS Lytle Butte, MD      . insulin aspart (novoLOG) injection 0-9 Units  0-9 Units Subcutaneous TID WC Lytle Butte, MD      . loratadine (CLARITIN) tablet 10 mg  10 mg Oral Daily Samara Deist, DPM      . Derrill Memo ON 11/05/2015] metFORMIN (GLUCOPHAGE-XR) 24 hr tablet 500 mg  500 mg Oral Q breakfast Samara Deist, DPM      . methocarbamol (ROBAXIN) tablet 500 mg  500 mg Oral Q8H PRN Samara Deist, DPM      . metoprolol tartrate (LOPRESSOR) tablet 25 mg  25 mg Oral BID Samara Deist, DPM      . multivitamin with minerals tablet 1 tablet  1 tablet Oral Daily Samara Deist, DPM      . Derrill Memo ON 11/05/2015] pantoprazole (PROTONIX) EC tablet 40 mg  40 mg Oral Daily Samara Deist, DPM      . potassium chloride SA (K-DUR,KLOR-CON) CR tablet 20 mEq  20 mEq Oral Daily Samara Deist, DPM      . quiNINE (QUALAQUIN) capsule 648 mg  648 mg Oral Q8H Samara Deist, DPM      . simvastatin (ZOCOR) tablet 40 mg  40 mg Oral QHS Samara Deist, DPM      . traZODone (DESYREL) tablet 100 mg  100 mg Oral QHS PRN Samara Deist, DPM         Discharge Medications: Please see discharge summary for a list of discharge medications.  Relevant Imaging Results:  Relevant Lab Results:   Additional Information  (SSN: [id number])  Loralyn Freshwater, LCSW

## 2015-11-04 NOTE — Clinical Social Work Note (Signed)
Clinical Social Work Assessment  Patient Details  Name: Mark Terry. MRN: [id number] Date of Birth: June 28, 1935  Date of referral:  11/04/15               Reason for consult:  Facility Placement                Permission sought to share information with:  Chartered certified accountant granted to share information::  Yes, Verbal Permission Granted  Name::      [place]::   [place]   Relationship::     Contact Information:     Housing/Transportation Living arrangements for the past 2 months:  [place] of Information:  Patient, Other (Comment Required) (Girlfriend Garnetta Buddy ) Patient Interpreter Needed:  None Criminal Activity/Legal Involvement Pertinent to Current Situation/Hospitalization:  No - Comment as needed Significant Relationships:  Significant Other Lives with:  Significant Other Do you feel safe going back to the place where you live?  Yes Need for family participation in patient care:  Yes (Comment)  Care giving concerns:  Patient lives with his significant other Garnetta Buddy in [place].    Social Worker assessment / plan:  Holiday representative (CSW) received SNF consult. PT is pending. Patient will likely D/C on Monday per RN. CSW met with patient and his significant other Izora Gala was at bedside. CSW introduced self and explained role of CSW department. Patient was alert and oriented and was sitting up in the bed. Patient reported that he lives in [place] with his significant other [place]. Patient reported that he prefers to go to [facility] or [facility]. Per patient he has been to rehab before 6 years ago when he had a hip replacement. CSW explained that PT will evaluate patient and make a recommendation of SNF or home health and patient's insurance will approve SNF based on PT notes. Patient verbalized his understanding.   FL2 complete and faxed out. CSW will continue to follow and assist as  needed.    Employment status:  Retired Nurse, adult PT Recommendations:  Not assessed at this time [place] / Referral to community resources:  [place]  Patient/Family's Response to care:  Patient prefers to go to SNF at [facility] or [facility].   Patient/Family's Understanding of and Emotional Response to Diagnosis, Current Treatment, and Prognosis:  Patient was pleasant and thanked CSW for visit.   Emotional Assessment Appearance:  Appears stated age Attitude/Demeanor/Rapport:    Affect (typically observed):  Accepting, Adaptable, Pleasant Orientation:  Oriented to Self, Oriented to Place, Oriented to  Time, Oriented to Situation Alcohol / Substance use:  Not Applicable Psych involvement (Current and /or in the community):  No (Comment)  Discharge Needs  Concerns to be addressed:  Discharge Planning Concerns Readmission within the last 30 days:  No Current discharge risk:  Dependent with Mobility Barriers to Discharge:  Continued Medical Work up   Loralyn Freshwater, LCSW 11/04/2015, 2:51 PM

## 2015-11-04 NOTE — Anesthesia Procedure Notes (Addendum)
Anesthesia Regional Block:  Popliteal block  Pre-Anesthetic Checklist: ,, timeout performed, Correct Patient, Correct Site, Correct Laterality, Correct Procedure, Correct Position, site marked, Risks and benefits discussed,  Surgical consent,  Pre-op evaluation,  At surgeon's request and post-op pain management  Laterality: Right and Lower  Prep: chloraprep       Needles:  Injection technique: Single-shot  Needle Type: Echogenic Stimulator Needle     Needle Length: 10cm 10 cm Needle Gauge: 22 and 22 G    Additional Needles:  Procedures: ultrasound guided (picture in chart) Popliteal block Narrative:  Start time: 11/04/2015 9:45 AM End time: 11/04/2015 9:51 AM Injection made incrementally with aspirations every 5 mL.  Performed by: Personally  Anesthesiologist: Martha Clan  Additional Notes: Functioning IV was confirmed and monitors were applied.  A 49mm 22ga Stimuplex needle was used. Sterile prep and drape,hand hygiene and sterile gloves were used.  Negative aspiration and negative test dose prior to incremental administration of local anesthetic. The patient tolerated the procedure well.      Procedure Name: LMA Insertion Date/Time: 11/04/2015 9:55 AM Performed by: Jonna Clark Pre-anesthesia Checklist: Patient identified, Patient being monitored, Timeout performed, Emergency Drugs available and Suction available Patient Re-evaluated:Patient Re-evaluated prior to inductionOxygen Delivery Method: Circle system utilized Preoxygenation: Pre-oxygenation with 100% oxygen Intubation Type: IV induction Ventilation: Mask ventilation without difficulty LMA: LMA inserted LMA Size: 4.5 Tube type: Oral Number of attempts: 1 Placement Confirmation: positive ETCO2 and breath sounds checked- equal and bilateral Tube secured with: Tape Dental Injury: Teeth and Oropharynx as per pre-operative assessment

## 2015-11-04 NOTE — Care Management Note (Signed)
Case Management Note  Patient Details  Name: Mark Terry. MRN: [id number] Date of Birth: 1935/02/19  Subjective/Objective:        81yo Mark Terry was admitted 11/04/15 with an arthritic right ankle which was repaired same day by Dr Samara Deist. Mark Terry is already on daily low dose ASA. Currently pending a PT evaluation.             Action/Plan:   Expected Discharge Date:  11/07/15               Expected Discharge Plan:     In-House Referral:     Discharge planning Services     Post Acute Care Choice:    Choice offered to:     DME Arranged:    DME Agency:     HH Arranged:    HH Agency:     Status of Service:     Medicare Important Message Given:    Date Medicare IM Given:    Medicare IM give by:    Date Additional Medicare IM Given:    Additional Medicare Important Message give by:     If discussed at [place] of Stay Meetings, dates discussed:    Additional Comments:  Mark Terry A, RN 11/04/2015, 2:40 PM

## 2015-11-04 NOTE — Consult Note (Signed)
[facility] NAME: Mark Terry    MR#:  [id number]  DATE OF BIRTH:  1934-08-10  DATE OF ADMISSION:  11/04/2015  PRIMARY CARE PHYSICIAN: [name], MD   REQUESTING/REFERRING PHYSICIAN: fowler  CHIEF COMPLAINT:  Scheduled right ankle arthodesis  HISTORY OF PRESENT ILLNESS:  Mark Terry  is a 81 y.o. male with a known history of type 2 diabetes non insulin requiring, hypertension essential, seen post op. At this time no complaints/no pain- the effects of anesthesia are still present. Multiple family members at bedside  PAST MEDICAL HISTORY:   Past Medical History  Diagnosis Date  . PONV (postoperative nausea and vomiting)   . Hyperlipidemia     takes Simvastin daily  . Coronary artery disease   . COPD (chronic obstructive pulmonary disease) ([place])   . Emphysema   . Shortness of breath     with exertion  . Sleep apnea     uses CPAP  . History of bronchitis     last time a couple of yrs ago  . Pneumonia     hx of last time a couple of yrs ago  . Arthritis   . Joint pain   . Joint swelling   . Chronic back pain     stenosis  . Bruises easily   . H/O hiatal hernia   . GERD (gastroesophageal reflux disease)     takes Omeprazole daily  . History of gastric ulcer   . History of colon polyps   . H/O esophagogastroduodenoscopy   . Hypertension     takes Metoprolol daily and Cardura nightly  . Peripheral edema     takes Lasix daily  . Leg cramps     takes quinine prn  . Diabetes mellitus without complication ([place])     takes Glimepiride daily  . History of blood transfusion     no reaction noted  . Insomnia     takes Trazodone nightly  . History of MRSA infection   . CHF (congestive heart failure) ([place])   . Panic attacks   . Melanoma ([place])   . Anemia   . Liver fatty degeneration   . Anginal pain ([place])     PAST SURGICAL HISTOIRY:   Past Surgical History  Procedure Laterality Date  . Cholecystectomy   1970  . Replacement total knee bilateral  '86 and 2000  . Total hip arthroplasty Right 2012    [facility]  . Neck surgery      x 2  . Coronary artery bypass graft  2000     3 vessels  . Colonoscopy    . Bilateral cataract surgery    . Reverse shoulder arthroplasty  05/30/2012    Procedure: REVERSE SHOULDER ARTHROPLASTY;  Surgeon: Augustin Schooling, MD;  Location: [facility];  Service: Orthopedics;  Laterality: Right;  right reverse shoulder arthroplasty  . Back surgery      x 4, last lumb. laminectomy Dr. Annette Stable 2008 cervical fusion x 2    SOCIAL HISTORY:   Social History  Substance Use Topics  . Smoking status: Former Smoker -- 1.00 packs/day for 25 years    Types: Cigarettes    Quit date: 07/09/1968  . Smokeless tobacco: Not on file     Comment: quit at age 75  . Alcohol Use: No    FAMILY HISTORY:   Family History  Problem Relation Age of Onset  . Alzheimer's disease Mother   . Heart attack  Father   . Bipolar disorder Brother     DRUG ALLERGIES:   Allergies  Allergen Reactions  . Codeine Sulfate     Patient denies  . Darvon [Propoxyphene Hcl] Other (See Comments)    Short of breath  . Demerol [Meperidine] Other (See Comments)    Short of breath    REVIEW OF SYSTEMS:  CONSTITUTIONAL: No fever, fatigue or weakness.  EYES: No blurred or double vision.  EARS, NOSE, AND THROAT: No tinnitus or ear pain.  RESPIRATORY: No cough, shortness of breath, wheezing or hemoptysis.  CARDIOVASCULAR: No chest pain, orthopnea, edema.  GASTROINTESTINAL: No nausea, vomiting, diarrhea or abdominal pain.  GENITOURINARY: No dysuria, hematuria.  ENDOCRINE: No polyuria, nocturia,  HEMATOLOGY: No anemia, easy bruising or bleeding SKIN: No rash or lesion. MUSCULOSKELETAL: No current joint pain or arthritis.   NEUROLOGIC: No tingling, numbness, weakness.  PSYCHIATRY: No anxiety or depression.   MEDICATIONS AT HOME:   Prior to Admission medications   Medication Sig Start Date End Date  Taking? Authorizing Provider  amoxicillin (AMOXIL) 500 MG tablet Take 500 mg by mouth daily. As needed   Yes Historical Provider, MD  aspirin 81 MG tablet Take 81 mg by mouth daily.   Yes Historical Provider, MD  doxazosin (CARDURA) 2 MG tablet TAKE ONE (1) TABLET BY MOUTH EVERY DAY 07/02/15  Yes Birdie Sons, MD  fluticasone [facility]) 50 MCG/ACT nasal spray USE ONE TO TWO SPRAYS IN EACH NOSTRIL EVERY DAY 07/07/15  Yes Birdie Sons, MD  furosemide (LASIX) 40 MG tablet TAKE ONE (1) TABLET BY MOUTH EVERY DAY 05/06/15  Yes Birdie Sons, MD  gabapentin (NEURONTIN) 300 MG capsule Take 300 mg by mouth 2 (two) times daily. Reported on 09/13/2015   Yes Historical Provider, MD  glimepiride (AMARYL) 1 MG tablet Take 1 mg by mouth daily before breakfast.   Yes Historical Provider, MD  HYDROcodone-acetaminophen (NORCO/VICODIN) 5-325 MG per tablet Take 1 tablet by mouth every 6 (six) hours as needed for moderate pain.   Yes Historical Provider, MD  loratadine (CLARITIN) 10 MG tablet Take 10 mg by mouth daily.   Yes Historical Provider, MD  meloxicam (MOBIC) 15 MG tablet TAKE ONE TABLET BY MOUTH DAILY AS NEEDED 09/05/15  Yes Birdie Sons, MD  metFORMIN (GLUCOPHAGE-XR) 500 MG 24 hr tablet Take 1 tablet (500 mg total) by mouth daily. 02/16/15  Yes Birdie Sons, MD  methocarbamol (ROBAXIN) 500 MG tablet Take 1 tablet (500 mg total) by mouth 3 (three) times daily as needed. 05/30/12  Yes Netta Cedars, MD  metoprolol tartrate (LOPRESSOR) 25 MG tablet Take 25 mg by mouth 2 (two) times daily. Reported on 09/13/2015   Yes Historical Provider, MD  Multiple Vitamin (MULTIVITAMIN WITH MINERALS) TABS Take 1 tablet by mouth daily.   Yes Historical Provider, MD  omeprazole (PRILOSEC) 20 MG capsule TAKE ONE (1) CAPSULE BY MOUTH 2 TIMES DAILY 10/03/15  Yes Birdie Sons, MD  potassium chloride SA (K-DUR,KLOR-CON) 20 MEQ tablet Take 20 mEq by mouth daily.   Yes Historical Provider, MD  quiNINE (QUALAQUIN) 324 MG capsule  Take 648 mg by mouth every 8 (eight) hours. For leg cramps   Yes Historical Provider, MD  simvastatin (ZOCOR) 40 MG tablet TAKE ONE TABLET BY MOUTH EVERY NIGHT AT BEDTIME 10/03/15  Yes Birdie Sons, MD  traZODone (DESYREL) 100 MG tablet 1/2 to 1 tablet at bedtime as needed for insomnia. 12/28/14  Yes Birdie Sons, MD  VITAL SIGNS:  Blood pressure 137/85, pulse 83, temperature 97.6 F (36.4 C), temperature source Oral, resp. rate 18, height 5' 1.5" (1.562 m), weight 101.152 kg (223 lb), SpO2 100 %.  PHYSICAL EXAMINATION:  GENERAL:  81 y.o.-year-old patient lying in the bed with no acute distress.  EYES: Pupils equal, round, reactive to light and accommodation. No scleral icterus. Extraocular muscles intact.  HEENT: Head atraumatic, normocephalic. Oropharynx and nasopharynx clear.  NECK:  Supple, no jugular venous distention. No thyroid enlargement, no tenderness.  LUNGS: Normal breath sounds bilaterally, no wheezing, rales,rhonchi or crepitation. No use of accessory muscles of respiration.  CARDIOVASCULAR: S1, S2 normal. No murmurs, rubs, or gallops.  ABDOMEN: Soft, nontender, nondistended. Bowel sounds present. No organomegaly or mass.  EXTREMITIES: No pedal edema, cyanosis, or clubbing.  NEUROLOGIC: Cranial nerves II through XII are intact. Muscle strength 5/5 in all extremities. Sensation intact. Gait not checked.  PSYCHIATRIC: The patient is alert and oriented x 3.  SKIN: No obvious rash, lesion, or ulcer.   LABORATORY PANEL:   CBC  Recent Labs Lab 11/04/15 1304  WBC 5.5  HGB 11.9*  HCT 34.8*  PLT 126*   ------------------------------------------------------------------------------------------------------------------  Chemistries   Recent Labs Lab 11/04/15 1304  CREATININE 1.38*   ------------------------------------------------------------------------------------------------------------------  Cardiac Enzymes No results for input(s): TROPONINI in the last  168 hours. ------------------------------------------------------------------------------------------------------------------  RADIOLOGY:  Dg C-arm 1-60 Min  11/04/2015  CLINICAL DATA:  Right ankle ORIF. EXAM: DG C-ARM 61-120 MIN COMPARISON:  No recent prior. FINDINGS: Open reduction internal fixation. Hardware intact. Diffuse degenerative change. IMPRESSION: ORIF Fahrenheit right ankle. Electronically Signed   By: Marcello Moores  Register   On: 11/04/2015 13:02    EKG:   Orders placed or performed during the hospital encounter of 05/30/12  . EKG 12 lead  . EKG 12 lead  . EKG    IMPRESSION AND PLAN:   81 year old male history type 2 diabetes presenting for scheduled right ankle arthrodesis  1. Type 2 diabetes, non insulin: add insulin sliding scale 2. Essential hypertension: lasix, lopressor, add as needed hydralazine 3. gerd without esophagitis: ppi 4. Hyperlipidemia unspecified: statin 5. Post op day #0 right ankle arthrodesis: pain control, bowel regiment, PT    All the records are reviewed and case discussed with Consulting provider. Management plans discussed with the patient, family and they are in agreement.  CODE STATUS: full  TOTAL TIME TAKING CARE OF THIS PATIENT: 33 minutes.    Hower,  Karenann Cai.D on 11/04/2015 at 2:26 PM  Between 7am to 6pm - Pager - [phone]  After 6pm: House Pager: - [facility]  Office  [phone]  CC: Primary care Physician: [name], MD

## 2015-11-04 NOTE — Anesthesia Preprocedure Evaluation (Signed)
Anesthesia Evaluation  Patient identified by MRN, date of birth, ID band Patient awake    Reviewed: Allergy & Precautions, H&P , NPO status , Patient's Chart, lab work & pertinent test results, reviewed documented beta blocker date and time   History of Anesthesia Complications (+) PONV and history of anesthetic complications  Airway Mallampati: II  TM Distance: >3 FB Neck ROM: full    Dental no notable dental hx. (+) Caps, Teeth Intact   Pulmonary shortness of breath, sleep apnea and Continuous Positive Airway Pressure Ventilation , neg pneumonia , COPD,  COPD inhaler, neg recent URI, former smoker, PE   Pulmonary exam normal breath sounds clear to auscultation       Cardiovascular Exercise Tolerance: Good hypertension, On Medications and On Home Beta Blockers (-) angina+ CAD, + CABG (3 vessels many years ago) and +CHF  (-) Past MI and (-) Cardiac Stents Normal cardiovascular exam(-) dysrhythmias (-) Valvular Problems/Murmurs Rhythm:regular Rate:Normal     Neuro/Psych neg Seizures  Neuromuscular disease negative psych ROS   GI/Hepatic Neg liver ROS, hiatal hernia, GERD  ,  Endo/Other  diabetes, Oral Hypoglycemic AgentsMorbid obesity  Renal/GU negative Renal ROS  negative genitourinary   Musculoskeletal   Abdominal   Peds  Hematology negative hematology ROS (+)   Anesthesia Other Findings Past Medical History:   PONV (postoperative nausea and vomiting)                     Hyperlipidemia                                                 Comment:takes Simvastin daily   Coronary artery disease                                      COPD (chronic obstructive pulmonary disease) (*              Emphysema                                                    Shortness of breath                                            Comment:with exertion   Sleep apnea                                                    Comment:uses CPAP    History of bronchitis                                          Comment:last time a couple of yrs ago   Pneumonia  Comment:hx of last time a couple of yrs ago   Arthritis                                                    Joint pain                                                   Joint swelling                                               Chronic back pain                                              Comment:stenosis   Bruises easily                                               H/O hiatal hernia                                            GERD (gastroesophageal reflux disease)                         Comment:takes Omeprazole daily   History of gastric ulcer                                     History of colon polyps                                      H/O esophagogastroduodenoscopy                               Hypertension                                                   Comment:takes Metoprolol daily and Cardura nightly   Peripheral edema                                               Comment:takes Lasix daily   Leg cramps  Comment:takes quinine prn   Diabetes mellitus without complication (HCC)                   Comment:takes Glimepiride daily   History of blood transfusion                                   Comment:no reaction noted   Insomnia                                                       Comment:takes Trazodone nightly   History of MRSA infection                                    CHF (congestive heart failure) (HCC)                         Panic attacks                                                Melanoma (HCC)                                               Anemia                                                       Liver fatty degeneration                                     Anginal pain (HCC)                                            Reproductive/Obstetrics negative OB ROS                             Anesthesia Physical Anesthesia Plan  ASA: III  Anesthesia Plan: General and Regional   Post-op Pain Management: GA combined w/ Regional for post-op pain   Induction:   Airway Management Planned:   Additional Equipment:   Intra-op Plan:   Post-operative Plan:   Informed Consent: I have reviewed the patients History and Physical, chart, labs and discussed the procedure including the risks, benefits and alternatives for the proposed anesthesia with the patient or authorized representative who has indicated his/her understanding and acceptance.   Dental Advisory Given  Plan Discussed with: Anesthesiologist, CRNA and Surgeon  Anesthesia Plan Comments:         Anesthesia Quick Evaluation

## 2015-11-04 NOTE — Transfer of Care (Signed)
Immediate Anesthesia Transfer of Care Note  Patient: Mark Terry.  Procedure(s) Performed: Procedure(s): ARTHRODESIS ANKLE (Right)  Patient Location: PACU  Anesthesia Type:General  Level of Consciousness: awake, alert  and oriented  Airway & Oxygen Therapy: Patient Spontanous Breathing and Patient connected to face mask oxygen  Post-op Assessment: Report given to RN and Post -op Vital signs reviewed and stable  Post vital signs: Reviewed and stable  Last Vitals:  Filed Vitals:   11/04/15 0833 11/04/15 1235  BP: 191/89 149/72  Pulse: 89 96  Temp: 37.2 C 35.8 C  Resp: 16 18    Last Pain:  Filed Vitals:   11/04/15 1236  PainSc: 8          Complications: No apparent anesthesia complications

## 2015-11-04 NOTE — Pre-Procedure Instructions (Signed)
Called Dr Vickki Muff regarding applying bone stimulator.  States "Will send the sales person out to set the device."

## 2015-11-04 NOTE — H&P (Signed)
Mark Terry. is an 81 y.o. male.   Chief Complaint:  <principal problem not specified>   HPI: Pt admitted s/p right ankle arthrodesis.  Multiple co morbidities.  Admitted for postop pain control and PT evaluation with likely snf placement.  Past Medical History  Diagnosis Date  . PONV (postoperative nausea and vomiting)   . Hyperlipidemia     takes Simvastin daily  . Coronary artery disease   . COPD (chronic obstructive pulmonary disease) ([place])   . Emphysema   . Shortness of breath     with exertion  . Sleep apnea     uses CPAP  . History of bronchitis     last time a couple of yrs ago  . Pneumonia     hx of last time a couple of yrs ago  . Arthritis   . Joint pain   . Joint swelling   . Chronic back pain     stenosis  . Bruises easily   . H/O hiatal hernia   . GERD (gastroesophageal reflux disease)     takes Omeprazole daily  . History of gastric ulcer   . History of colon polyps   . H/O esophagogastroduodenoscopy   . Hypertension     takes Metoprolol daily and Cardura nightly  . Peripheral edema     takes Lasix daily  . Leg cramps     takes quinine prn  . Diabetes mellitus without complication ([place])     takes Glimepiride daily  . History of blood transfusion     no reaction noted  . Insomnia     takes Trazodone nightly  . History of MRSA infection   . CHF (congestive heart failure) ([place])   . Panic attacks   . Melanoma ([place])   . Anemia   . Liver fatty degeneration   . Anginal pain [facility])     Past Surgical History  Procedure Laterality Date  . Cholecystectomy  1970  . Replacement total knee bilateral  '86 and 2000  . Total hip arthroplasty Right 2012    [facility]  . Neck surgery      x 2  . Coronary artery bypass graft  2000     3 vessels  . Colonoscopy    . Bilateral cataract surgery    . Reverse shoulder arthroplasty  05/30/2012    Procedure: REVERSE SHOULDER ARTHROPLASTY;  Surgeon: Augustin Schooling, MD;  Location: [facility];  Service: Orthopedics;   Laterality: Right;  right reverse shoulder arthroplasty  . Back surgery      x 4, last lumb. laminectomy Dr. Annette Stable 2008 cervical fusion x 2    Family History  Problem Relation Age of Onset  . Alzheimer's disease Mother   . Heart attack Father   . Bipolar disorder Brother    Social History:  reports that he quit smoking about 47 years ago. His smoking use included Cigarettes. He has a 25 pack-year smoking history. He does not have any smokeless tobacco history on file. He reports that he does not drink alcohol or use illicit drugs.  Allergies:  Allergies  Allergen Reactions  . Codeine Sulfate     Patient denies  . Darvon [Propoxyphene Hcl] Other (See Comments)    Short of breath  . Demerol [Meperidine] Other (See Comments)    Short of breath    ROS  Medications Prior to Admission  Medication Sig Dispense Refill  . amoxicillin (AMOXIL) 500 MG tablet Take 500 mg by mouth daily. As  needed    . aspirin 81 MG tablet Take 81 mg by mouth daily.    Marland Kitchen doxazosin (CARDURA) 2 MG tablet TAKE ONE (1) TABLET BY MOUTH EVERY DAY 30 tablet 5  . fluticasone (FLONASE) 50 MCG/ACT nasal spray USE ONE TO TWO SPRAYS IN EACH NOSTRIL EVERY DAY 16 g 5  . furosemide (LASIX) 40 MG tablet TAKE ONE (1) TABLET BY MOUTH EVERY DAY 90 tablet 1  . gabapentin (NEURONTIN) 300 MG capsule Take 300 mg by mouth 2 (two) times daily. Reported on 09/13/2015    . glimepiride (AMARYL) 1 MG tablet Take 1 mg by mouth daily before breakfast.    . HYDROcodone-acetaminophen (NORCO/VICODIN) 5-325 MG per tablet Take 1 tablet by mouth every 6 (six) hours as needed for moderate pain.    Marland Kitchen loratadine (CLARITIN) 10 MG tablet Take 10 mg by mouth daily.    . meloxicam (MOBIC) 15 MG tablet TAKE ONE TABLET BY MOUTH DAILY AS NEEDED 30 tablet 5  . metFORMIN (GLUCOPHAGE-XR) 500 MG 24 hr tablet Take 1 tablet (500 mg total) by mouth daily. 90 tablet 4  . methocarbamol (ROBAXIN) 500 MG tablet Take 1 tablet (500 mg total) by mouth 3 (three) times  daily as needed. 60 tablet 1  . metoprolol tartrate (LOPRESSOR) 25 MG tablet Take 25 mg by mouth 2 (two) times daily. Reported on 09/13/2015    . Multiple Vitamin (MULTIVITAMIN WITH MINERALS) TABS Take 1 tablet by mouth daily.    Marland Kitchen omeprazole (PRILOSEC) 20 MG capsule TAKE ONE (1) CAPSULE BY MOUTH 2 TIMES DAILY 180 capsule 3  . potassium chloride SA (K-DUR,KLOR-CON) 20 MEQ tablet Take 20 mEq by mouth daily.    . quiNINE (QUALAQUIN) 324 MG capsule Take 648 mg by mouth every 8 (eight) hours. For leg cramps    . simvastatin (ZOCOR) 40 MG tablet TAKE ONE TABLET BY MOUTH EVERY NIGHT AT BEDTIME 90 tablet 3  . traZODone (DESYREL) 100 MG tablet 1/2 to 1 tablet at bedtime as needed for insomnia. 30 tablet 5    Physical Exam: General: Alert and oriented.  No apparent distress. Vascular:  Left foot:Dorsalis Pedis:  present Posterior Tibial:  present  Right foot: splint intact Neuro:intact sensation Derm:Right foot incisions at ankle. Ortho/MS: SP right ankle arthrodesis   Results for orders placed or performed during the hospital encounter of 11/04/15 (from the past 48 hour(s))  Glucose, capillary     Status: Abnormal   Collection Time: 11/04/15  8:24 AM  Result Value Ref Range   Glucose-Capillary 161 (H) 65 - 99 mg/dL  Glucose, capillary     Status: Abnormal   Collection Time: 11/04/15 12:36 PM  Result Value Ref Range   Glucose-Capillary 139 (H) 65 - 99 mg/dL  CBC     Status: Abnormal   Collection Time: 11/04/15  1:04 PM  Result Value Ref Range   WBC 5.5 3.8 - 10.6 K/uL   RBC 3.81 (L) 4.40 - 5.90 MIL/uL   Hemoglobin 11.9 (L) 13.0 - 18.0 g/dL   HCT 34.8 (L) 40.0 - 52.0 %   MCV 91.3 80.0 - 100.0 fL   MCH 31.2 26.0 - 34.0 pg   MCHC 34.2 32.0 - 36.0 g/dL   RDW 14.5 11.5 - 14.5 %   Platelets 126 (L) 150 - 440 K/uL  Creatinine, serum     Status: Abnormal   Collection Time: 11/04/15  1:04 PM  Result Value Ref Range   Creatinine, Ser 1.38 (H) 0.61 - 1.24 mg/dL  GFR calc non Af Amer 47 (L)  >60 mL/min   GFR calc Af Amer 55 (L) >60 mL/min    Comment: (NOTE) The eGFR has been calculated using the CKD EPI equation. This calculation has not been validated in all clinical situations. eGFR's persistently <60 mL/min signify possible Chronic Kidney Disease.   Hemoglobin A1c     Status: Abnormal   Collection Time: 11/04/15  1:04 PM  Result Value Ref Range   Hgb A1c MFr Bld 6.9 (H) 4.0 - 6.0 %  Glucose, capillary     Status: Abnormal   Collection Time: 11/04/15  4:07 PM  Result Value Ref Range   Glucose-Capillary 181 (H) 65 - 99 mg/dL   Comment 1 Notify RN    Dg C-arm 1-60 Min  11/04/2015  CLINICAL DATA:  Right ankle ORIF. EXAM: DG C-ARM 61-120 MIN COMPARISON:  No recent prior. FINDINGS: Open reduction internal fixation. Hardware intact. Diffuse degenerative change. IMPRESSION: ORIF Fahrenheit right ankle. Electronically Signed   By: Marcello Moores  Register   On: 11/04/2015 13:02    Blood pressure 135/62, pulse 100, temperature 99.1 F (37.3 C), temperature source Oral, resp. rate 18, height 5' 1.5" (1.562 m), weight 101.152 kg (223 lb), SpO2 100 %.  Assessment/Plan Admit for post op pain control and PT evaluate and treat. Consult IM for assistance with medical management. Suspect will need SNF placement. F/U tomorrow. D/W pt and family need for complete NWB.  Samara Deist A 11/04/2015, 5:30 PM

## 2015-11-04 NOTE — Clinical Social Work Placement (Signed)
   CLINICAL SOCIAL WORK PLACEMENT  NOTE  Date:  11/04/2015  Patient Details  Name: Mark Terry. MRN: [id number] Date of Birth: 03-28-1935  Clinical Social Work is seeking post-discharge placement for this patient at the [place] level of care (*CSW will initial, date and re-position this form in  chart as items are completed):  Yes   Patient/family provided with [place] Work Department's list of facilities offering this level of care within the geographic area requested by the patient (or if unable, by the patient's family).  Yes   Patient/family informed of their freedom to choose among providers that offer the needed level of care, that participate in Medicare, Medicaid or managed care program needed by the patient, have an available bed and are willing to accept the patient.  Yes   Patient/family informed of [facility]'s ownership interest in [facility] and [facility], as well as of the fact that they are under no obligation to receive care at these facilities.  PASRR submitted to EDS on       PASRR number received on       Existing PASRR number confirmed on 11/04/15     FL2 transmitted to all facilities in geographic area requested by pt/family on 11/04/15     FL2 transmitted to all facilities within larger geographic area on       Patient informed that his/her managed care company has contracts with or will negotiate with certain facilities, including the following:            Patient/family informed of bed offers received.  Patient chooses bed at       Physician recommends and patient chooses bed at      Patient to be transferred to   on  .  Patient to be transferred to facility by       Patient family notified on   of transfer.  Name of family member notified:        PHYSICIAN       Additional Comment:    _______________________________________________ Loralyn Freshwater, LCSW 11/04/2015, 2:49 PM

## 2015-11-04 NOTE — H&P (Signed)
HISTORY AND PHYSICAL INTERVAL NOTE:  11/04/2015  8:30 AM  Mark Terry.  has presented today for surgery, with the diagnosis of degenerative arthritis.  The various methods of treatment have been discussed with the patient.  No guarantees were given.  After consideration of risks, benefits and other options for treatment, the patient has consented to surgery.  I have reviewed the patients' chart and labs.Pt cleared by cardiology previously.    No data found.   A history and physical examination was performed in my office.  The patient was reexamined.  There have been no changes to this history and physical examination. Plan for right ankle arthrodesis.  Will admit for pain management and medical evaluation post-op and physical therapy evaluation in anticipation of SNF placement post-op.  Samara Deist A

## 2015-11-05 ENCOUNTER — Encounter: Payer: Self-pay | Admitting: Radiology

## 2015-11-05 ENCOUNTER — Inpatient Hospital Stay: Payer: Medicare Other

## 2015-11-05 DIAGNOSIS — R079 Chest pain, unspecified: Secondary | ICD-10-CM | POA: Diagnosis not present

## 2015-11-05 LAB — GLUCOSE, CAPILLARY
Glucose-Capillary: 176 mg/dL — ABNORMAL HIGH (ref 65–99)
Glucose-Capillary: 229 mg/dL — ABNORMAL HIGH (ref 65–99)
Glucose-Capillary: 115 mg/dL — ABNORMAL HIGH (ref 65–99)
Glucose-Capillary: 222 mg/dL — ABNORMAL HIGH (ref 65–99)

## 2015-11-05 LAB — TROPONIN I
Troponin I: <0.03 ng/mL (ref <0.031)
Troponin I: <0.03 ng/mL (ref <0.031)
Troponin I: <0.03 ng/mL (ref <0.031)

## 2015-11-05 MED ORDER — OXYCODONE HCL 5 MG PO TABS
5.0000 mg | ORAL_TABLET | ORAL | Status: DC | PRN
Start: 2015-11-05 — End: 2015-11-07
  Administered 2015-11-05 (×2): 10 mg via ORAL
  Administered 2015-11-05 – 2015-11-06 (×2): 5 mg via ORAL
  Administered 2015-11-06: 10 mg via ORAL
  Administered 2015-11-06 – 2015-11-07 (×2): 5 mg via ORAL
  Filled 2015-11-05 (×3): qty 2
  Filled 2015-11-05 (×2): qty 1
  Filled 2015-11-05: qty 2
  Filled 2015-11-05: qty 1

## 2015-11-05 MED ORDER — IOPAMIDOL (ISOVUE-370) INJECTION 76%
75.0000 mL | Freq: Once | INTRAVENOUS | Status: AC | PRN
  Administered 2015-11-05: 75 mL via INTRAVENOUS

## 2015-11-05 NOTE — Progress Notes (Signed)
Physical Therapy Treatment Patient Details Name: Mark Terry. MRN: [id number] DOB: Jun 20, 1935 Today's Date: 11/05/2015    History of Present Illness Patient is a 81 y.o. male admitted on 04 November 2015 for R ankle arthrodesis. Patient has had bilateral knee and shoulder replacements.    PT Comments    Patient demonstrates improvements in bed mobility compared to this morning. Still required moderate assistance but was better able to leverage body weight. Patient able to perform 4 STS compared to 3 STS this morning; however, he had new complaint of chest pain that abolished with seated rest breaks. HR/O2 saturations WNL. RN notified of complaint. Plan to progress to stepping/transfers if appropriate at next visit.  Follow Up Recommendations  SNF     Equipment Recommendations  None recommended by PT    Recommendations for Other Services       Precautions / Restrictions Precautions Precautions: Fall Restrictions Weight Bearing Restrictions: Yes RLE Weight Bearing: Non weight bearing    Mobility  Bed Mobility Overal bed mobility: Needs Assistance Bed Mobility: Supine to Sit;Sit to Supine     Supine to sit: Mod assist Sit to supine: Mod assist   General bed mobility comments: Patient required moderate assistance to get to EOB, having difficulty guiding trunk. Better able to scoot to EOB compared to this morning. Required moderate assistance to return to supine and scoot to [facility], utilizing trapeze and bed rails.  Transfers Overall transfer level: Needs assistance Equipment used: Rolling Pellegrino (2 wheeled) Transfers: Sit to/from Stand Sit to Stand: From elevated surface;Mod assist         General transfer comment: Patient required moderate assistance to perform transfer. Was able to perform x4 STS with seated rest breaks. Complained of intermittent chest pain upon standing that consistently abolished upon sitting; RN notified.   Ambulation/Gait                  Stairs            Wheelchair Mobility    Modified Rankin (Stroke Patients Only)       Balance Overall balance assessment: Needs assistance Sitting-balance support: Bilateral upper extremity supported Sitting balance-Leahy Scale: Fair     Standing balance support: Bilateral upper extremity supported Standing balance-Leahy Scale: Fair                      Cognition Arousal/Alertness: Awake/alert Behavior During Therapy: WFL for tasks assessed/performed Overall Cognitive Status: Within Functional Limits for tasks assessed                      Exercises General Exercises - Lower Extremity Ankle Circles/Pumps: AROM;Left;20 reps Quad Sets: AROM;Both;20 reps Gluteal Sets: AROM;Both;20 reps Short Arc Quad: AROM;Both;15 reps Hip ABduction/ADduction: AAROM;15 reps;Both Straight Leg Raises: AAROM;Both;15 reps    General Comments        Pertinent Vitals/Pain Pain Assessment: 0-10 Pain Score: 8  Pain Location: R ankle Pain Descriptors / Indicators: Aching Pain Intervention(s): Limited activity within patient's tolerance;Monitored during session    Home Living                      Prior Function            PT Goals (current goals can now be found in the care plan section) Acute Rehab PT Goals Patient Stated Goal: "To get back to the gym" PT Goal Formulation: With patient Time For Goal Achievement: 11/19/15 Potential to Achieve Goals:  Good Progress towards PT goals: Progressing toward goals    Frequency  BID    PT Plan Current plan remains appropriate    Co-evaluation             End of Session Equipment Utilized During Treatment: Gait belt Activity Tolerance: Patient tolerated treatment well;Patient limited by fatigue Patient left: in bed;with call bell/phone within reach;with bed alarm set;with family/visitor present;with SCD's reapplied     Time: [id number] PT Time Calculation (min) (ACUTE ONLY): 28 min  Charges:   $Therapeutic Exercise: 8-22 mins $Therapeutic Activity: 8-22 mins                    G Codes:      Dorice Lamas, PT, DPT 11/05/2015, 2:19 PM

## 2015-11-05 NOTE — Progress Notes (Signed)
Patient c/o electric type pain in the center of his chest every time he stood up with physical therapist. His pain would disappear once the patient sat down. Patient states he has had this sensation before and is relived by sitting on previous occasions. Pt states he" feels Fine". Called Dr Charlann Boxer orders received.

## 2015-11-05 NOTE — Progress Notes (Addendum)
[place] [facility] [place] NAME: Mark Terry    MR#:  [id number]  DATE OF BIRTH:  04/17/1935  SUBJECTIVE:  CHIEF COMPLAINT:  No chief complaint on file. Patient is a 81 year old Caucasian male with past history significant for history of arthritis, joint pain, back pain, gastroesophageal reflux disease, lower extremity edema, diabetes, CHF who presented with right ankle arthrosis and underwent ORIF by Dr. Vickki Muff on 11/04/2015. Patient complains of significant pain in the right lower extremity. He was evaluated by physical therapist and recommended skilled nursing facility placement for rehabilitation.   Review of Systems  Constitutional: Negative for fever, chills and weight loss.  HENT: Negative for congestion.   Eyes: Negative for blurred vision and double vision.  Respiratory: Negative for cough, sputum production, shortness of breath and wheezing.   Cardiovascular: Negative for chest pain, palpitations, orthopnea, leg swelling and PND.  Gastrointestinal: Negative for nausea, vomiting, abdominal pain, diarrhea, constipation and blood in stool.  Genitourinary: Negative for dysuria, urgency, frequency and hematuria.  Musculoskeletal: Positive for joint pain. Negative for falls.  Neurological: Negative for dizziness, tremors, focal weakness and headaches.  Endo/Heme/Allergies: Does not bruise/bleed easily.  Psychiatric/Behavioral: Negative for depression. The patient does not have insomnia.     VITAL SIGNS: Blood pressure 105/56, pulse 96, temperature 98.8 F (37.1 C), temperature source Oral, resp. rate 20, height 5' 1.5" (1.562 m), weight 101.152 kg (223 lb), SpO2 93 %.  PHYSICAL EXAMINATION:   GENERAL:  81 y.o.-year-old patient lying in the bed in mild distress due to right lower extremity pain.  EYES: Pupils equal, round, reactive to light and accommodation. No scleral icterus. Extraocular muscles intact.  HEENT: Head atraumatic,  normocephalic. Oropharynx and nasopharynx clear.  NECK:  Supple, no jugular venous distention. No thyroid enlargement, no tenderness.  LUNGS: Normal breath sounds bilaterally, no wheezing, rales,rhonchi or crepitation. No use of accessory muscles of respiration.  CARDIOVASCULAR: S1, S2 normal. No murmurs, rubs, or gallops.  ABDOMEN: Soft, nontender, nondistended. Bowel sounds present. No organomegaly or mass.  EXTREMITIES: No pedal edema, cyanosis, or clubbing. Right lower extremity is immobilized.  NEUROLOGIC: Cranial nerves II through XII are intact. Muscle strength 5/5 in all extremities. Sensation intact. Gait not checked.  PSYCHIATRIC: The patient is alert and oriented x 3.  SKIN: No obvious rash, lesion, or ulcer.   ORDERS/RESULTS REVIEWED:   CBC  Recent Labs Lab 11/04/15 1304  WBC 5.5  HGB 11.9*  HCT 34.8*  PLT 126*  MCV 91.3  MCH 31.2  MCHC 34.2  RDW 14.5   ------------------------------------------------------------------------------------------------------------------  Chemistries   Recent Labs Lab 11/04/15 1304  CREATININE 1.38*   ------------------------------------------------------------------------------------------------------------------ estimated creatinine clearance is 44.6 mL/min (by C-G formula based on Cr of 1.38). ------------------------------------------------------------------------------------------------------------------ No results for input(s): TSH, T4TOTAL, T3FREE, THYROIDAB in the last 72 hours.  Invalid input(s): FREET3  Cardiac Enzymes No results for input(s): CKMB, TROPONINI, MYOGLOBIN in the last 168 hours.  Invalid input(s): CK ------------------------------------------------------------------------------------------------------------------ Invalid input(s): POCBNP ---------------------------------------------------------------------------------------------------------------  RADIOLOGY: Dg C-arm 1-60 Min  11/04/2015  CLINICAL  DATA:  Right ankle ORIF. EXAM: DG C-ARM 61-120 MIN COMPARISON:  No recent prior. FINDINGS: Open reduction internal fixation. Hardware intact. Diffuse degenerative change. IMPRESSION: ORIF Fahrenheit right ankle. Electronically Signed   By: Marcello Moores  Register   On: 11/04/2015 13:02    EKG:  Orders placed or performed during the hospital encounter of 05/30/12  . EKG 12 lead  . EKG 12 lead  . EKG    ASSESSMENT  AND PLAN:  Active Problems:   Arthritis of ankle or foot, degenerative #1 renal insufficiency, chronic, follow with therapy #2. Right ankle arthrosis status post ORIF 28th of April 2017 by Dr. Vickki Muff, patient was evaluated by physical therapist and recommended rehabilitation placement in skilled nursing facility, continue pain medications as needed, advancing hydrocodone to oxycodone, follow clinically #3 anemia, seems to be stable postoperatively. #4 diabetes mellitus, well controlled with hemoglobin A1c 6.9, continue metformin, glyburide, blood glucose levels are high, likely stress related, continue sliding scale insulin, may need to advance medications. #5. Hyperlipidemia, continue outpatient medications #6, sharp, midsternal chest pain, sudden onset, get EKG, stat. Follow cardiac enzymes 3. Get CT angiogram of the chest to rule out pulmonary embolism.  Management plans discussed with the patient, family and they are in agreement.   DRUG ALLERGIES:  Allergies  Allergen Reactions  . Codeine Sulfate     Patient denies  . Darvon [Propoxyphene Hcl] Other (See Comments)    Short of breath  . Demerol [Meperidine] Other (See Comments)    Short of breath    CODE STATUS:     Code Status Orders        Start     Ordered   11/04/15 1340  Full code   Continuous     11/04/15 1339    Code Status History    Date Active Date Inactive Code Status Order ID Comments User Context   05/30/2012  5:33 PM 05/31/2012  2:16 PM Full Code [id number]  Evlyn Clines, RN Inpatient      TOTAL  TIME TAKING CARE OF THIS PATIENT: 35 minutes.    Theodoro Grist M.D on 11/05/2015 at 11:29 AM  Between 7am to 6pm - Pager - [phone]  After 6pm go to [email or web address] - password [email or web address] [employer] [facility]  Office  [phone]  CC: Primary care physician; [name], MD

## 2015-11-05 NOTE — Progress Notes (Signed)
Daily Progress Note   Subjective  - 1 Day Post-Op  F/u right ankle arthrodesis.  Some pain to medial ankle.  Tolerating pain meds.  No n/v/f/c/sob/cp.    Objective Filed Vitals:   11/04/15 1826 11/04/15 1924 11/05/15 0358 11/05/15 0729  BP: 119/57 133/52 109/58 105/56  Pulse: 102 103 95 96  Temp: 99.1 F (37.3 C) 99 F (37.2 C) 98.2 F (36.8 C) 98.8 F (37.1 C)  TempSrc: Oral Oral Oral Oral  Resp: 18 22 22 20   Height:      Weight:      SpO2: 98% 97% 92% 93%    Physical Exam: Sensation intact to toes.  Good CFT.  Splint intact.  Laboratory CBC    Component Value Date/Time   WBC 5.5 11/04/2015 1304   WBC 4.4 10/10/2015 0916   HGB 11.9* 11/04/2015 1304   HCT 34.8* 11/04/2015 1304   HCT 35.9* 10/10/2015 0916   PLT 126* 11/04/2015 1304   PLT 116* 10/10/2015 0916    BMET    Component Value Date/Time   NA 144 10/10/2015 0916   NA 139 05/31/2012 0500   K 5.1 10/10/2015 0916   CL 105 10/10/2015 0916   CO2 23 10/10/2015 0916   GLUCOSE 127* 10/10/2015 0916   GLUCOSE 171* 05/31/2012 0500   BUN 21 10/10/2015 0916   BUN 23 05/31/2012 0500   CREATININE 1.38* 11/04/2015 1304   CALCIUM 10.0 10/10/2015 0916   GFRNONAA 47* 11/04/2015 1304   GFRAA 55* 11/04/2015 1304    Assessment/Planning: S/p right ankle arthrodesis.   C/o some pain.  C/W pain medications.  VSS. No fever.  PT will be evaluating.  Pt states he was able to sit up and transfer to chair with NWB.  Will continue to evaluate.    Samara Deist A  11/05/2015, 9:48 AM

## 2015-11-05 NOTE — Progress Notes (Signed)
Physical Therapy Evaluation Patient Details Name: Amuel Rousselle. MRN: [id number] DOB: [birth year] Today's Date: 11/05/2015   History of Present Illness  Patient is a 81 y.o. male admitted on 04 November 2015 for R ankle arthrodesis. Patient has had bilateral knee and shoulder replacements.  Clinical Impression  Patient is a pleasant 81 y.o. Male s/p R ankle arthrodesis. 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 Notes that they have groceries delivered typically and that he previously used to go to gym on [address] for 30 minutes daily. On evaluation, patient required moderate assistance to perform bed mobility and sit to stand transfers. Due to decreased dynamic balance in standing, gait was unable to be assessed in this session. Because patient is not at his PLOF, it is believed that he will benefit from continued skilled and progressive PT with f/u at SNF to allow him to safely return home when capable of performing associated responsibilities.    Follow Up Recommendations SNF    Equipment Recommendations  None recommended by PT    Recommendations for Other Services       Precautions / Restrictions Precautions Precautions: Fall Restrictions Weight Bearing Restrictions: Yes RLE Weight Bearing: Non weight bearing      Mobility  Bed Mobility Overal bed mobility: Needs Assistance Bed Mobility: Supine to Sit;Sit to Supine     Supine to sit: Mod assist Sit to supine: Min assist   General bed mobility comments: Patient required moderate assistance to move to EOB, primarily guiding trunk. Minimal assistance to scoot to EOB. Required minimal assistance returning to supine but utilized bed rails and overhead trapeze.  Transfers Overall transfer level: Needs assistance Equipment used: Rolling Minder (2 wheeled) Transfers: Sit to/from Stand Sit to Stand: From elevated surface;Mod assist         General transfer  comment: Patient required moderate assistance to steady RW when rising from sit to stand from elevated bed surface. Patient was able to perform STS x3 with rest breaks, improving in performance and decreasing required assistance.  Ambulation/Gait                Stairs            Wheelchair Mobility    Modified Rankin (Stroke Patients Only)       Balance Overall balance assessment: Needs assistance;History of Falls Sitting-balance support: Bilateral upper extremity supported Sitting balance-Leahy Scale: Fair     Standing balance support: Bilateral upper extremity supported Standing balance-Leahy Scale: Fair                               Pertinent Vitals/Pain Pain Assessment: 0-10 Pain Score: 8  Pain Location: R ankle Pain Descriptors / Indicators: Aching Pain Intervention(s): Limited activity within patient's tolerance;Monitored during session;Premedicated before session    Home Living Family/patient expects to be discharged to:: Skilled nursing facility Living Arrangements: Spouse/significant other               Additional Comments: Patient is caregiver for significant other    Prior Function Level of Independence: Needs assistance   Gait / Transfers Assistance Needed: Modified independent for household distances with assistive devices  ADL's / Homemaking Assistance Needed: Heats frozen foods; groceries obtained by assistant  Comments: Patient has an Transport planner, RW, and forearm crutches. Also has a standing lift that he had begun to use more recently.  Hand Dominance        Extremity/Trunk Assessment   Upper Extremity Assessment: Generalized weakness;LUE deficits/detail       LUE Deficits / Details: Decreased overhead mobility ~25%   Lower Extremity Assessment: Generalized weakness;RLE deficits/detail RLE Deficits / Details: Unable to assess R ankle ROM/strength       Communication   Communication: No difficulties   Cognition Arousal/Alertness: Awake/alert Behavior During Therapy: WFL for tasks assessed/performed Overall Cognitive Status: Within Functional Limits for tasks assessed                      General Comments      Exercises        Assessment/Plan    PT Assessment Patient needs continued PT services  PT Diagnosis Difficulty walking;Generalized weakness   PT Problem List Decreased strength;Decreased range of motion;Decreased activity tolerance;Decreased balance;Decreased mobility;Decreased knowledge of use of DME;Decreased safety awareness;Pain;Obesity  PT Treatment Interventions DME instruction;Gait training;Functional mobility training;Therapeutic activities;Therapeutic exercise;Balance training;Patient/family education   PT Goals (Current goals can be found in the Care Plan section) Acute Rehab PT Goals Patient Stated Goal: "To get back to the gym" PT Goal Formulation: With patient Time For Goal Achievement: 11/19/15 Potential to Achieve Goals: Good    Frequency BID   Barriers to discharge Decreased caregiver support      Co-evaluation               End of Session Equipment Utilized During Treatment: Gait belt Activity Tolerance: Patient limited by pain;Patient limited by fatigue Patient left: in bed;with call bell/phone within reach;with bed alarm set;with SCD's reapplied           Time: [id number] PT Time Calculation (min) (ACUTE ONLY): 32 min   Charges:   PT Evaluation $PT Eval Low Complexity: 1 Procedure     PT G Codes:        Dorice Lamas, PT, DPT 11/05/2015, 10:29 AM

## 2015-11-05 NOTE — Care Management Note (Signed)
Case Management Note  Patient Details  Name: Lethaniel Poche. MRN: [id number] Date of Birth: Nov 25, 1934  Subjective/Objective:   Current discharge plan is SNF. SW has discussed Rehab with Mr Shillito and he agrees with this plan.                 Action/Plan:   Expected Discharge Date:  11/07/15               Expected Discharge Plan:     In-House Referral:     Discharge planning Services     Post Acute Care Choice:    Choice offered to:     DME Arranged:    DME Agency:     HH Arranged:    HH Agency:     Status of Service:     Medicare Important Message Given:    Date Medicare IM Given:    Medicare IM give by:    Date Additional Medicare IM Given:    Additional Medicare Important Message give by:     If discussed at [place] of Stay Meetings, dates discussed:    Additional Comments:  Delores Edelstein A, RN 11/05/2015, 2:10 PM

## 2015-11-06 ENCOUNTER — Encounter: Payer: Self-pay | Admitting: Podiatry

## 2015-11-06 LAB — GLUCOSE, CAPILLARY
Glucose-Capillary: 167 mg/dL — ABNORMAL HIGH (ref 65–99)
Glucose-Capillary: 208 mg/dL — ABNORMAL HIGH (ref 65–99)
Glucose-Capillary: 146 mg/dL — ABNORMAL HIGH (ref 65–99)
Glucose-Capillary: 167 mg/dL — ABNORMAL HIGH (ref 65–99)

## 2015-11-06 MED ORDER — POLYETHYLENE GLYCOL 3350 17 G PO PACK
17.0000 g | PACK | Freq: Every day | ORAL | Status: DC | PRN
  Administered 2015-11-06: 17 g via ORAL
  Filled 2015-11-06: qty 1

## 2015-11-06 MED ORDER — BISACODYL 10 MG RE SUPP
10.0000 mg | Freq: Every day | RECTAL | Status: DC | PRN
  Administered 2015-11-06: 10 mg via RECTAL
  Filled 2015-11-06: qty 1

## 2015-11-06 MED ORDER — ONDANSETRON HCL 4 MG/2ML IJ SOLN
4.0000 mg | Freq: Four times a day (QID) | INTRAMUSCULAR | Status: DC | PRN
  Administered 2015-11-06: 4 mg via INTRAVENOUS
  Filled 2015-11-06: qty 2

## 2015-11-06 MED ORDER — SENNA 8.6 MG PO TABS
1.0000 | ORAL_TABLET | Freq: Every day | ORAL | Status: DC
  Administered 2015-11-06 – 2015-11-07 (×2): 8.6 mg via ORAL
  Filled 2015-11-06 (×2): qty 1

## 2015-11-06 MED ORDER — DOCUSATE SODIUM 100 MG PO CAPS
100.0000 mg | ORAL_CAPSULE | Freq: Two times a day (BID) | ORAL | Status: DC
  Administered 2015-11-06 – 2015-11-07 (×3): 100 mg via ORAL
  Filled 2015-11-06 (×3): qty 1

## 2015-11-06 NOTE — Progress Notes (Signed)
F/u right ankle arthrodesis. Pain states pain controlled at this time. Some difficulty with PT this am per pt.   SNF placement recommended per PT notes. Chest pains yesterday and CT chest negative for PE. No BM since Friday.  On Miralax and Senokot. Splint intact. IM managing blood sugars, slightly elevated and c/w sliding scale. Hopefully plan to d/c to SNF early next week.

## 2015-11-06 NOTE — Progress Notes (Signed)
Physical Therapy Treatment Patient Details Name: Mark Terry. MRN: [id number] DOB: 09-09-1934 Today's Date: 11/06/2015    History of Present Illness Patient is a 81 y.o. male admitted on 04 November 2015 for R ankle arthrodesis. Patient has had bilateral knee and shoulder replacements.    PT Comments    Patient demonstrates increased reliance on assistance at today's session. Patient required maximal and total assistance to get out/in bed, respectively. Unable to perform successful sit to stand when attempting to transfer to chair. Could not perform squat pivot transfer due to decreased balance and patient's weight. PT to reassess patient's progress towards goals at next session and attempt to perform bed to chair transfer if safe and appropriate.  Follow Up Recommendations  SNF     Equipment Recommendations  None recommended by PT    Recommendations for Other Services       Precautions / Restrictions Precautions Precautions: Fall Restrictions Weight Bearing Restrictions: Yes RLE Weight Bearing: Non weight bearing    Mobility  Bed Mobility Overal bed mobility: Needs Assistance Bed Mobility: Supine to Sit;Sit to Supine     Supine to sit: Max assist;HOB elevated Sit to supine: +2 for physical assistance;HOB elevated   General bed mobility comments: Patient required more assistance at today's session to perform all bed mobility. Was unable to scoot or bridge through L LE. Patient required total +2 assistance to return to supine.  Transfers Overall transfer level: Needs assistance Equipment used: Rolling Strassman (2 wheeled) Transfers: Sit to/from Stand Sit to Stand: Max assist;From elevated surface         General transfer comment: PT attempted to transfer patient to chair but was unable to assist him into standing x8 attempts with maximal assistance, blocking knee. Patient unable to follow verbal cues to lean forward and push through L LE.  Ambulation/Gait                  Stairs            Wheelchair Mobility    Modified Rankin (Stroke Patients Only)       Balance Overall balance assessment: Needs assistance Sitting-balance support: Bilateral upper extremity supported Sitting balance-Leahy Scale: Fair     Standing balance support: Bilateral upper extremity supported Standing balance-Leahy Scale: Poor                      Cognition Arousal/Alertness: Lethargic Behavior During Therapy: WFL for tasks assessed/performed Overall Cognitive Status: Within Functional Limits for tasks assessed                      Exercises General Exercises - Lower Extremity Ankle Circles/Pumps: AROM;Left;20 reps Quad Sets: AROM;Both;20 reps Gluteal Sets: AROM;Both;20 reps Hip ABduction/ADduction: AROM;20 reps;Both Straight Leg Raises: AROM;Both;20 reps    General Comments        Pertinent Vitals/Pain Pain Assessment: 0-10 Pain Score: 8  Pain Location: R foot Pain Descriptors / Indicators: Aching Pain Intervention(s): Limited activity within patient's tolerance;Monitored during session;Premedicated before session    Home Living                      Prior Function            PT Goals (current goals can now be found in the care plan section) Acute Rehab PT Goals Patient Stated Goal: "To get back to the gym" PT Goal Formulation: With patient Time For Goal Achievement: 11/19/15 Potential to Achieve Goals:  Fair Progress towards PT goals: PT to reassess next treatment    Frequency  BID    PT Plan Current plan remains appropriate    Co-evaluation             End of Session Equipment Utilized During Treatment: Gait belt Activity Tolerance: Patient limited by fatigue Patient left: in bed;with call bell/phone within reach;with bed alarm set;with SCD's reapplied     Time: 0805-0829 PT Time Calculation (min) (ACUTE ONLY): 24 min  Charges:  $Therapeutic Exercise: 8-22 mins $Therapeutic  Activity: 8-22 mins                    G Codes:      Dorice Lamas, PT, DPT 11/06/2015, 9:28 AM

## 2015-11-06 NOTE — Progress Notes (Signed)
[facility] NAME: Mark Terry    MR#:  [id number]  DATE OF BIRTH:  06-13-1935  SUBJECTIVE:  CHIEF COMPLAINT:  No chief complaint on file. Patient is a 81 year old Caucasian male with past history significant for history of arthritis, joint pain, back pain, gastroesophageal reflux disease, lower extremity edema, diabetes, CHF who presented with right ankle arthrosis and underwent ORIF by Dr. Vickki Muff on 11/04/2015. Patient complains of less pain in the right lower extremity since he was initiated on oxycodone as needed. He was evaluated by physical therapist and recommended skilled nursing facility placement for rehabilitation. Complains of constipation  Review of Systems  Constitutional: Negative for fever, chills and weight loss.  HENT: Negative for congestion.   Eyes: Negative for blurred vision and double vision.  Respiratory: Negative for cough, sputum production, shortness of breath and wheezing.   Cardiovascular: Negative for chest pain, palpitations, orthopnea, leg swelling and PND.  Gastrointestinal: Positive for constipation. Negative for nausea, vomiting, abdominal pain, diarrhea and blood in stool.  Genitourinary: Negative for dysuria, urgency, frequency and hematuria.  Musculoskeletal: Positive for joint pain. Negative for falls.  Neurological: Negative for dizziness, tremors, focal weakness and headaches.  Endo/Heme/Allergies: Does not bruise/bleed easily.  Psychiatric/Behavioral: Negative for depression. The patient does not have insomnia.     VITAL SIGNS: Blood pressure 104/65, pulse 87, temperature 98.3 F (36.8 C), temperature source Oral, resp. rate 18, height 5' 1.5" (1.562 m), weight 101.152 kg (223 lb), SpO2 95 %.  PHYSICAL EXAMINATION:   GENERAL:  81 y.o.-year-old patient lying in the bed in no distress today, comfortable, smiling, engaging in conversation eagerly EYES: Pupils equal, round, reactive to  light and accommodation. No scleral icterus. Extraocular muscles intact.  HEENT: Head atraumatic, normocephalic. Oropharynx and nasopharynx clear.  NECK:  Supple, no jugular venous distention. No thyroid enlargement, no tenderness.  LUNGS: Normal breath sounds bilaterally, no wheezing, rales,rhonchi or crepitation. No use of accessory muscles of respiration.  CARDIOVASCULAR: S1, S2 normal. No murmurs, rubs, or gallops.  ABDOMEN: Soft, nontender, nondistended. Bowel sounds present. No organomegaly or mass.  EXTREMITIES: No pedal edema, cyanosis, or clubbing. Right lower extremity is immobilized.  NEUROLOGIC: Cranial nerves II through XII are intact. Muscle strength 5/5 in all extremities. Sensation intact. Gait not checked.  PSYCHIATRIC: The patient is alert and oriented x 3.  SKIN: No obvious rash, lesion, or ulcer.   ORDERS/RESULTS REVIEWED:   CBC  Recent Labs Lab 11/04/15 1304  WBC 5.5  HGB 11.9*  HCT 34.8*  PLT 126*  MCV 91.3  MCH 31.2  MCHC 34.2  RDW 14.5   ------------------------------------------------------------------------------------------------------------------  Chemistries   Recent Labs Lab 11/04/15 1304  CREATININE 1.38*   ------------------------------------------------------------------------------------------------------------------ estimated creatinine clearance is 44.6 mL/min (by C-G formula based on Cr of 1.38). ------------------------------------------------------------------------------------------------------------------ No results for input(s): TSH, T4TOTAL, T3FREE, THYROIDAB in the last 72 hours.  Invalid input(s): FREET3  Cardiac Enzymes  Recent Labs Lab 11/05/15 1352 11/05/15 1717 11/05/15 2221  TROPONINI <0.03 <0.03 <0.03   ------------------------------------------------------------------------------------------------------------------ Invalid input(s):  POCBNP ---------------------------------------------------------------------------------------------------------------  RADIOLOGY: Ct Angio Chest Pe W/cm &/or Wo Cm  11/05/2015  CLINICAL DATA:  Sudden onset midsternal chest pain starting this morning. Right ankle surgery on 11/04/2015. EXAM: CT ANGIOGRAPHY CHEST WITH CONTRAST TECHNIQUE: Multidetector CT imaging of the chest was performed using the standard protocol during bolus administration of intravenous contrast. Multiplanar CT image reconstructions and MIPs were obtained to evaluate the vascular anatomy. CONTRAST:  75 cc Isovue  370 COMPARISON:  None. FINDINGS: Mediastinum/Lymph Nodes: There is no pulmonary embolism identified within the main, lobar or segmental pulmonary arteries bilaterally. Some of the peripheral segmental and subsegmental pulmonary arteries are difficult to definitively characterize due to mild patient breathing motion artifact. Scattered atherosclerotic changes noted along the walls of the normal-caliber thoracic aorta. No aortic aneurysm or dissection. Heart size is normal. No pericardial effusion. Coronary artery calcifications noted. Patient is status post CABG. Lungs/Pleura: Mild atelectasis and/or scarring within each lung. No pneumonia or pleural effusion. No pneumothorax. Trachea and central bronchi are unremarkable. Upper abdomen: No acute findings. Small adrenal masses bilaterally, too small to definitively characterize, incompletely imaged on the left. Probable fatty infiltration of the liver, also incompletely imaged. Musculoskeletal: No acute or suspicious osseous lesion. Degenerative changes throughout the thoracic spine, at least moderate in degree. Status post median sternotomy for CABG. Bilateral shoulder replacement. Superficial soft tissues are unremarkable. Review of the MIP images confirms the above findings. IMPRESSION: 1. No pulmonary embolism seen, with mild study limitations detailed above. 2.  No aortic aneurysm  or dissection. 3. Heart size is normal. No pericardial effusion. Coronary artery calcifications. Status post CABG. 4. Mild scarring/ atelectasis bilaterally. Lungs otherwise clear. No evidence of pneumonia. No pleural effusion. 5. Bilateral small adrenal masses, too small to definitively characterize, incompletely imaged on the left. Consider nonemergent adrenal protocol MRI at some point to ensure benignity. 6.  Probable fatty infiltration of the liver. Electronically Signed   By: Franki Cabot M.D.   On: 11/05/2015 14:37   Dg C-arm 1-60 Min  11/04/2015  CLINICAL DATA:  Right ankle ORIF. EXAM: DG C-ARM 61-120 MIN COMPARISON:  No recent prior. FINDINGS: Open reduction internal fixation. Hardware intact. Diffuse degenerative change. IMPRESSION: ORIF Fahrenheit right ankle. Electronically Signed   By: Marcello Moores  Register   On: 11/04/2015 13:02    EKG:  Orders placed or performed during the hospital encounter of 11/04/15  . EKG 12-Lead  . EKG 12-Lead    ASSESSMENT AND PLAN:  Active Problems:   Arthritis of ankle or foot, degenerative #1 renal insufficiency, chronic, follow with therapy, Check BMP in the morning #2. Right ankle arthrosis status post ORIF 28th of April 2017 by Dr. Vickki Muff, patient was evaluated by physical therapist and recommended rehabilitation placement in skilled nursing facility, continue oxycodone as needed, feels more comfortable today #3 anemia, recheck hemoglobin tomorrow morning. #4 diabetes mellitus, well controlled with hemoglobin A1c 6.9, continue metformin, glyburide, blood glucose levels are ranging between 115-230, likely stress/pain related, continue sliding scale insulin, patient will resume his outpatient medications upon discharge home. #5. Hyperlipidemia, continue outpatient medications #6, sharp, midsternal chest pain, sudden onset, EKG was normal, as well as cardiac enzymes 3. CT angiogram of the chest showed no pulmonary embolism. Etiology remains unclear, will  need to be evaluated as outpatient. Resolved. Reassess if recurrent Management plans discussed with the patient, family and they are in agreement.   DRUG ALLERGIES:  Allergies  Allergen Reactions  . Codeine Sulfate     Patient denies  . Darvon [Propoxyphene Hcl] Other (See Comments)    Short of breath  . Demerol [Meperidine] Other (See Comments)    Short of breath    CODE STATUS:     Code Status Orders        Start     Ordered   11/04/15 1340  Full code   Continuous     11/04/15 1339    Code Status History    Date Active  Date Inactive Code Status Order ID Comments User Context   05/30/2012  5:33 PM 05/31/2012  2:16 PM Full Code [id number]  Evlyn Clines, RN Inpatient      TOTAL TIME TAKING CARE OF THIS PATIENT: 30 minutes.    Theodoro Grist M.D on 11/06/2015 at 11:42 AM  Between 7am to 6pm - Pager - [phone]  After 6pm go to [email or web address] - password [email or web address] [facility]  Office  [phone]  CC: Primary care physician; [name], MD

## 2015-11-06 NOTE — Clinical Social Work Note (Signed)
CSW provided bed offers and pt chose [facility]. CSW will update facility. Possible discharge Monday. CSW will continue to follow.   Darden Dates, MSW, LCSW  Clinical Social Worker  [phone]

## 2015-11-06 NOTE — Care Management Note (Signed)
Case Management Note  Patient Details  Name: Mark Terry. MRN: [id number] Date of Birth: September 03, 1934  Subjective/Objective:      Anticipate discharge to Rehab on Monday.               Action/Plan:   Expected Discharge Date:  11/07/15               Expected Discharge Plan:     In-House Referral:     Discharge planning Services     Post Acute Care Choice:    Choice offered to:     DME Arranged:    DME Agency:     HH Arranged:    HH Agency:     Status of Service:     Medicare Important Message Given:    Date Medicare IM Given:    Medicare IM give by:    Date Additional Medicare IM Given:    Additional Medicare Important Message give by:     If discussed at [place] of Stay Meetings, dates discussed:    Additional Comments:  Deysha Cartier A, RN 11/06/2015, 12:15 PM

## 2015-11-07 ENCOUNTER — Encounter
Admission: RE | Admit: 2015-11-07 | Discharge: 2015-11-07 | Disposition: A | Discharge status: Home / Self Care | Payer: Medicare Other | Source: Ambulatory Visit | Attending: Internal Medicine | Admitting: Internal Medicine

## 2015-11-07 ENCOUNTER — Encounter: Payer: Self-pay | Admitting: Podiatry

## 2015-11-07 DIAGNOSIS — R2689 Other abnormalities of gait and mobility: Secondary | ICD-10-CM | POA: Diagnosis not present

## 2015-11-07 DIAGNOSIS — E119 Type 2 diabetes mellitus without complications: Secondary | ICD-10-CM | POA: Diagnosis not present

## 2015-11-07 DIAGNOSIS — G4733 Obstructive sleep apnea (adult) (pediatric): Secondary | ICD-10-CM | POA: Diagnosis not present

## 2015-11-07 DIAGNOSIS — Z9989 Dependence on other enabling machines and devices: Secondary | ICD-10-CM | POA: Diagnosis not present

## 2015-11-07 DIAGNOSIS — I2510 Atherosclerotic heart disease of native coronary artery without angina pectoris: Secondary | ICD-10-CM | POA: Diagnosis not present

## 2015-11-07 DIAGNOSIS — I25119 Atherosclerotic heart disease of native coronary artery with unspecified angina pectoris: Secondary | ICD-10-CM | POA: Diagnosis not present

## 2015-11-07 DIAGNOSIS — B351 Tinea unguium: Secondary | ICD-10-CM | POA: Diagnosis not present

## 2015-11-07 DIAGNOSIS — Z87891 Personal history of nicotine dependence: Secondary | ICD-10-CM | POA: Diagnosis not present

## 2015-11-07 DIAGNOSIS — J309 Allergic rhinitis, unspecified: Secondary | ICD-10-CM | POA: Diagnosis not present

## 2015-11-07 DIAGNOSIS — M19071 Primary osteoarthritis, right ankle and foot: Secondary | ICD-10-CM | POA: Diagnosis not present

## 2015-11-07 DIAGNOSIS — J449 Chronic obstructive pulmonary disease, unspecified: Secondary | ICD-10-CM | POA: Diagnosis not present

## 2015-11-07 DIAGNOSIS — M159 Polyosteoarthritis, unspecified: Secondary | ICD-10-CM | POA: Diagnosis not present

## 2015-11-07 DIAGNOSIS — R32 Unspecified urinary incontinence: Secondary | ICD-10-CM | POA: Diagnosis not present

## 2015-11-07 DIAGNOSIS — M25571 Pain in right ankle and joints of right foot: Secondary | ICD-10-CM | POA: Diagnosis not present

## 2015-11-07 DIAGNOSIS — R293 Abnormal posture: Secondary | ICD-10-CM | POA: Diagnosis not present

## 2015-11-07 DIAGNOSIS — K219 Gastro-esophageal reflux disease without esophagitis: Secondary | ICD-10-CM | POA: Diagnosis not present

## 2015-11-07 DIAGNOSIS — M6281 Muscle weakness (generalized): Secondary | ICD-10-CM | POA: Diagnosis not present

## 2015-11-07 DIAGNOSIS — Z7409 Other reduced mobility: Secondary | ICD-10-CM | POA: Diagnosis not present

## 2015-11-07 DIAGNOSIS — M5136 Other intervertebral disc degeneration, lumbar region: Secondary | ICD-10-CM | POA: Diagnosis not present

## 2015-11-07 DIAGNOSIS — Z7984 Long term (current) use of oral hypoglycemic drugs: Secondary | ICD-10-CM | POA: Diagnosis not present

## 2015-11-07 DIAGNOSIS — M4802 Spinal stenosis, cervical region: Secondary | ICD-10-CM | POA: Diagnosis not present

## 2015-11-07 DIAGNOSIS — E785 Hyperlipidemia, unspecified: Secondary | ICD-10-CM | POA: Diagnosis not present

## 2015-11-07 DIAGNOSIS — Z951 Presence of aortocoronary bypass graft: Secondary | ICD-10-CM | POA: Diagnosis not present

## 2015-11-07 DIAGNOSIS — K5900 Constipation, unspecified: Secondary | ICD-10-CM | POA: Diagnosis not present

## 2015-11-07 DIAGNOSIS — N189 Chronic kidney disease, unspecified: Secondary | ICD-10-CM | POA: Diagnosis not present

## 2015-11-07 DIAGNOSIS — M1990 Unspecified osteoarthritis, unspecified site: Secondary | ICD-10-CM | POA: Diagnosis not present

## 2015-11-07 DIAGNOSIS — R079 Chest pain, unspecified: Secondary | ICD-10-CM | POA: Diagnosis not present

## 2015-11-07 DIAGNOSIS — M79675 Pain in left toe(s): Secondary | ICD-10-CM | POA: Diagnosis not present

## 2015-11-07 DIAGNOSIS — R252 Cramp and spasm: Secondary | ICD-10-CM | POA: Diagnosis not present

## 2015-11-07 DIAGNOSIS — I129 Hypertensive chronic kidney disease with stage 1 through stage 4 chronic kidney disease, or unspecified chronic kidney disease: Secondary | ICD-10-CM | POA: Diagnosis not present

## 2015-11-07 LAB — CBC
HEMATOCRIT: 30.4 % — AB (ref 40.0–52.0)
Hemoglobin: 10.4 g/dL — ABNORMAL LOW (ref 13.0–18.0)
MCH: 31.4 pg (ref 26.0–34.0)
MCHC: 34.2 g/dL (ref 32.0–36.0)
MCV: 91.7 fL (ref 80.0–100.0)
Platelets: 119 10*3/uL — ABNORMAL LOW (ref 150–440)
RBC: 3.31 MIL/uL — AB (ref 4.40–5.90)
RDW: 14.5 % (ref 11.5–14.5)
WBC: 8.1 10*3/uL (ref 3.8–10.6)

## 2015-11-07 LAB — BASIC METABOLIC PANEL
Anion gap: 8 (ref 5–15)
BUN: 21 mg/dL — AB (ref 6–20)
CHLORIDE: 105 mmol/L (ref 101–111)
CO2: 25 mmol/L (ref 22–32)
Calcium: 9.1 mg/dL (ref 8.9–10.3)
Creatinine, Ser: 1.08 mg/dL (ref 0.61–1.24)
GFR calc non Af Amer: >60 mL/min (ref >60)
Glucose, Bld: 139 mg/dL — ABNORMAL HIGH (ref 65–99)
POTASSIUM: 4.4 mmol/L (ref 3.5–5.1)
SODIUM: 138 mmol/L (ref 135–145)

## 2015-11-07 LAB — GLUCOSE, CAPILLARY
Glucose-Capillary: 129 mg/dL — ABNORMAL HIGH (ref 65–99)
Glucose-Capillary: 130 mg/dL — ABNORMAL HIGH (ref 65–99)

## 2015-11-07 MED ORDER — POLYETHYLENE GLYCOL 3350 17 G PO PACK: 17.0000 g | PACK | Freq: Every day | ORAL | Status: DC | PRN

## 2015-11-07 MED ORDER — OXYCODONE HCL 5 MG PO TABS: 5.0000 mg | ORAL_TABLET | ORAL | Status: DC | PRN

## 2015-11-07 MED ORDER — ENOXAPARIN SODIUM 40 MG/0.4ML ~~LOC~~ SOLN: 40.0000 mg | SUBCUTANEOUS | Status: DC

## 2015-11-07 MED ORDER — NYSTATIN 100000 UNIT/GM EX POWD: 1.0000 g | Freq: Two times a day (BID) | CUTANEOUS | Status: DC

## 2015-11-07 MED ORDER — NALOXEGOL OXALATE 25 MG PO TABS
25.0000 mg | ORAL_TABLET | Freq: Once | ORAL | Status: AC
  Administered 2015-11-07: 25 mg via ORAL
  Filled 2015-11-07: qty 1

## 2015-11-07 MED ORDER — HYDROCODONE-ACETAMINOPHEN 5-325 MG PO TABS: 1.0000 | ORAL_TABLET | Freq: Four times a day (QID) | ORAL | Status: DC | PRN

## 2015-11-07 NOTE — Discharge Summary (Addendum)
Physician Discharge Summary  Patient ID: Mark Terry. MRN: [id number] DOB/AGE: 13-Sep-1934 81 y.o.  Admit date: 11/04/2015 Discharge date: 11/07/2015  Admission Diagnoses:  Arthritis of ankle or foot, degenerative Right ankle arthrodesis. Post op pain.   Discharge Diagnoses:  Principal Problem:   Arthritis of ankle or foot, degenerative CRI COPD Angina CAD sp CABG  Past Medical History  Diagnosis Date  . PONV (postoperative nausea and vomiting)   . Hyperlipidemia     takes Simvastin daily  . Coronary artery disease   . COPD (chronic obstructive pulmonary disease) ([place])   . Emphysema   . Shortness of breath     with exertion  . Sleep apnea     uses CPAP  . History of bronchitis     last time a couple of yrs ago  . Pneumonia     hx of last time a couple of yrs ago  . Arthritis   . Joint pain   . Joint swelling   . Chronic back pain     stenosis  . Bruises easily   . H/O hiatal hernia   . GERD (gastroesophageal reflux disease)     takes Omeprazole daily  . History of gastric ulcer   . History of colon polyps   . H/O esophagogastroduodenoscopy   . Hypertension     takes Metoprolol daily and Cardura nightly  . Peripheral edema     takes Lasix daily  . Leg cramps     takes quinine prn  . Diabetes mellitus without complication ([place])     takes Glimepiride daily  . History of blood transfusion     no reaction noted  . Insomnia     takes Trazodone nightly  . History of MRSA infection   . CHF (congestive heart failure) ([place])   . Panic attacks   . Melanoma ([place])   . Anemia   . Liver fatty degeneration   . Anginal pain ([place])     Surgeries: Procedure(s): ARTHRODESIS ANKLE on 11/04/2015   Consultants (if any): Treatment Team:  Lytle Butte, MD  Discharged Condition: Improved  Hospital Course: Mark Sport. is an 81 y.o. male who was admitted 11/04/2015 with a diagnosis of Arthritis of ankle or foot, degenerative post op pain s/p right ankle  arthrodesis and went to the operating room on 11/04/2015 and underwent the above named procedures.    He was given perioperative antibiotics:      Anti-infectives    Start     Dose/Rate Route Frequency Ordered Stop   11/04/15 1400  quiNINE (QUALAQUIN) capsule 648 mg     648 mg Oral Every 8 hours 11/04/15 1339     11/04/15 1400  amoxicillin (AMOXIL) capsule 500 mg     500 mg Oral Daily 11/04/15 1339     11/04/15 0300  ceFAZolin (ANCEF) IVPB 2g/100 mL premix     2 g 200 mL/hr over 30 Minutes Intravenous  Once 11/04/15 0248 11/04/15 1002    .  He was given sequential compression devices, early ambulation, and lovenox for DVT prophylaxis.  He benefited maximally from the hospital stay and there were no complications.    Recent vital signs:  Filed Vitals:   11/07/15 0744 11/07/15 0941  BP: 104/83   Pulse: 80 72  Temp: 97.7 F (36.5 C)   Resp: 18     Recent laboratory studies:  Lab Results  Component Value Date   HGB 10.4* 11/07/2015   HGB 11.9* 11/04/2015  HGB 10.8* 05/31/2012   Lab Results  Component Value Date   WBC 8.1 11/07/2015   PLT 119* 11/07/2015   Lab Results  Component Value Date   INR 0.99 05/27/2012   Lab Results  Component Value Date   NA 138 11/07/2015   K 4.4 11/07/2015   CL 105 11/07/2015   CO2 25 11/07/2015   BUN 21* 11/07/2015   CREATININE 1.08 11/07/2015   GLUCOSE 139* 11/07/2015    Discharge Medications:     Medication List    STOP taking these medications        amoxicillin 500 MG tablet  Commonly known as:  AMOXIL      TAKE these medications        aspirin 81 MG tablet  Take 81 mg by mouth daily.     doxazosin 2 MG tablet  Commonly known as:  CARDURA  TAKE ONE (1) TABLET BY MOUTH EVERY DAY     enoxaparin 40 MG/0.4ML injection  Commonly known as:  LOVENOX  Inject 0.4 mLs (40 mg total) into the skin daily.     fluticasone 50 MCG/ACT nasal spray  Commonly known as:  FLONASE  USE ONE TO TWO SPRAYS IN EACH NOSTRIL EVERY  DAY     furosemide 40 MG tablet  Commonly known as:  LASIX  TAKE ONE (1) TABLET BY MOUTH EVERY DAY     gabapentin 300 MG capsule  Commonly known as:  NEURONTIN  Take 300 mg by mouth 2 (two) times daily. Reported on 09/13/2015     glimepiride 1 MG tablet  Commonly known as:  AMARYL  Take 1 mg by mouth daily before breakfast.     HYDROcodone-acetaminophen 5-325 MG tablet  Commonly known as:  NORCO/VICODIN  Take 1 tablet by mouth every 6 (six) hours as needed for moderate pain.     loratadine 10 MG tablet  Commonly known as:  CLARITIN  Take 10 mg by mouth daily.     meloxicam 15 MG tablet  Commonly known as:  MOBIC  TAKE ONE TABLET BY MOUTH DAILY AS NEEDED     metFORMIN 500 MG 24 hr tablet  Commonly known as:  GLUCOPHAGE-XR  Take 1 tablet (500 mg total) by mouth daily.     methocarbamol 500 MG tablet  Commonly known as:  ROBAXIN  Take 1 tablet (500 mg total) by mouth 3 (three) times daily as needed.     metoprolol tartrate 25 MG tablet  Commonly known as:  LOPRESSOR  Take 25 mg by mouth 2 (two) times daily. Reported on 09/13/2015     multivitamin with minerals Tabs tablet  Take 1 tablet by mouth daily.     nystatin powder  Commonly known as:  nystatin  Apply 1 g topically 2 (two) times daily.     omeprazole 20 MG capsule  Commonly known as:  PRILOSEC  TAKE ONE (1) CAPSULE BY MOUTH 2 TIMES DAILY     oxyCODONE 5 MG immediate release tablet  Commonly known as:  Oxy IR/ROXICODONE  Take 1-2 tablets (5-10 mg total) by mouth every 4 (four) hours as needed for moderate pain.     polyethylene glycol packet  Commonly known as:  MIRALAX / GLYCOLAX  Take 17 g by mouth daily as needed for moderate constipation.     potassium chloride SA 20 MEQ tablet  Commonly known as:  K-DUR,KLOR-CON  Take 20 mEq by mouth daily.     quiNINE 324 MG capsule  Commonly known as:  QUALAQUIN  Take 648 mg by mouth every 8 (eight) hours. For leg cramps     simvastatin 40 MG tablet  Commonly  known as:  ZOCOR  TAKE ONE TABLET BY MOUTH EVERY NIGHT AT BEDTIME     traZODone 100 MG tablet  Commonly known as:  DESYREL  1/2 to 1 tablet at bedtime as needed for insomnia.        Diagnostic Studies: Ct Angio Chest Pe W/cm &/or Wo Cm  11/05/2015  CLINICAL DATA:  Sudden onset midsternal chest pain starting this morning. Right ankle surgery on 11/04/2015. EXAM: CT ANGIOGRAPHY CHEST WITH CONTRAST TECHNIQUE: Multidetector CT imaging of the chest was performed using the standard protocol during bolus administration of intravenous contrast. Multiplanar CT image reconstructions and MIPs were obtained to evaluate the vascular anatomy. CONTRAST:  75 cc Isovue 370 COMPARISON:  None. FINDINGS: Mediastinum/Lymph Nodes: There is no pulmonary embolism identified within the main, lobar or segmental pulmonary arteries bilaterally. Some of the peripheral segmental and subsegmental pulmonary arteries are difficult to definitively characterize due to mild patient breathing motion artifact. Scattered atherosclerotic changes noted along the walls of the normal-caliber thoracic aorta. No aortic aneurysm or dissection. Heart size is normal. No pericardial effusion. Coronary artery calcifications noted. Patient is status post CABG. Lungs/Pleura: Mild atelectasis and/or scarring within each lung. No pneumonia or pleural effusion. No pneumothorax. Trachea and central bronchi are unremarkable. Upper abdomen: No acute findings. Small adrenal masses bilaterally, too small to definitively characterize, incompletely imaged on the left. Probable fatty infiltration of the liver, also incompletely imaged. Musculoskeletal: No acute or suspicious osseous lesion. Degenerative changes throughout the thoracic spine, at least moderate in degree. Status post median sternotomy for CABG. Bilateral shoulder replacement. Superficial soft tissues are unremarkable. Review of the MIP images confirms the above findings. IMPRESSION: 1. No pulmonary  embolism seen, with mild study limitations detailed above. 2.  No aortic aneurysm or dissection. 3. Heart size is normal. No pericardial effusion. Coronary artery calcifications. Status post CABG. 4. Mild scarring/ atelectasis bilaterally. Lungs otherwise clear. No evidence of pneumonia. No pleural effusion. 5. Bilateral small adrenal masses, too small to definitively characterize, incompletely imaged on the left. Consider nonemergent adrenal protocol MRI at some point to ensure benignity. 6.  Probable fatty infiltration of the liver. Electronically Signed   By: Franki Cabot M.D.   On: 11/05/2015 14:37   Dg C-arm 1-60 Min  11/04/2015  CLINICAL DATA:  Right ankle ORIF. EXAM: DG C-ARM 61-120 MIN COMPARISON:  No recent prior. FINDINGS: Open reduction internal fixation. Hardware intact. Diffuse degenerative change. IMPRESSION: ORIF Fahrenheit right ankle. Electronically Signed   By: Marcello Moores  Register   On: 11/04/2015 13:02    Disposition: 01-Home or Self Care  Discharge Instructions    Diet - low sodium heart healthy    Complete by:  As directed      Increase activity slowly    Complete by:  As directed   Non weight bearing to right lower extremity.     Leave dressing on - Keep it clean, dry, and intact until clinic visit    Complete by:  As directed            Follow-up Information    Follow up with [facility] .   Specialty:  [place] information:   [address] [place] [phone]      Follow up with Elesa Hacker, DPM In 1 week.   Specialty:  Podiatry   Contact information:   [phone] [phone]      Follow up with Dr. Vickki Muff in 1 week.   Signed: Samara Deist A 11/07/2015, 2:05 PM

## 2015-11-07 NOTE — Progress Notes (Signed)
Patient is medically stable for D/C to [facility] today. Per Kim admissions coordinator at [facility] patient will go to room 208-B. RN will call report at [phone] and arrange EMS for transport. Clinical Education officer, museum (CSW) sent D/C Summary, FL2 and D/C Packet to [facility] via [employer]. Patient is aware of above. CSW attempted to contact patient's significant other Izora Gala however she did not answer and a voicemail was left. Please reconsult if future social work needs arise. CSW signing off.   Blima Rich, LCSW [phone]

## 2015-11-07 NOTE — Progress Notes (Signed)
Physical Therapy Treatment Patient Details Name: Mark Terry. MRN: [id number] DOB: 01/22/1935 Today's Date: 11/07/2015    History of Present Illness Patient is a 81 y.o. male admitted on 04 November 2015 for R ankle arthrodesis. Patient has had bilateral knee and shoulder replacements.    PT Comments    Pt reports pain a little more than in morning session. Agreeable to bed exercises. Limited bed exercises performed with instruction to continue as able several times per day. Pt verbalizes understanding. At the time of documentation pt currently has a discharge order to a skilled nursing facility to continue progress on strength, endurance and all functional mobility.   Follow Up Recommendations  SNF     Equipment Recommendations  None recommended by PT    Recommendations for Other Services       Precautions / Restrictions Restrictions Weight Bearing Restrictions: Yes RLE Weight Bearing: Non weight bearing    Mobility  Bed Mobility               General bed mobility comments: Not tested; remained in bed  Transfers                    Ambulation/Gait                 Stairs            Wheelchair Mobility    Modified Rankin (Stroke Patients Only)       Balance                                    Cognition Arousal/Alertness: Awake/alert Behavior During Therapy: WFL for tasks assessed/performed Overall Cognitive Status: Within Functional Limits for tasks assessed                      Exercises General Exercises - Lower Extremity Ankle Circles/Pumps: Left;AROM;20 reps;Supine Quad Sets: Strengthening;Both;Supine;10 reps Gluteal Sets: Strengthening;Both;Supine;10 reps Short Arc Quad: AROM;Both;Supine;10 reps Heel Slides: AAROM;Both;Supine;10 reps Hip ABduction/ADduction: AAROM;Right;Supine;10 reps (AROM L) Straight Leg Raises: AROM;Both;10 reps;Supine (Min Guard assist under R)    General Comments         Pertinent Vitals/Pain Pain Assessment: 0-10 Pain Score: 7  Pain Location: RLE/foot Pain Intervention(s): Limited activity within patient's tolerance;Monitored during session    Home Living                      Prior Function            PT Goals (current goals can now be found in the care plan section) Progress towards PT goals: Progressing toward goals (slowly)    Frequency  BID    PT Plan Current plan remains appropriate    Co-evaluation             End of Session   Activity Tolerance: Patient limited by fatigue;Patient limited by pain (Weakness) Patient left: in bed;with call bell/phone within reach;with bed alarm set     Time: 1341-1400 PT Time Calculation (min) (ACUTE ONLY): 19 min  Charges:  $Therapeutic Exercise: 8-22 mins                    G Codes:      Charlaine Dalton, PTA 11/07/2015, 2:53 PM

## 2015-11-07 NOTE — Progress Notes (Signed)
Report from [facility]. Patient resting quietly with family at bedside. No complaints at this time. IV removed, report called to facility and EMS called for transportation by [employer].  EMS here now to pick up patient. Packet sent with them to facility.

## 2015-11-07 NOTE — Care Management Important Message (Signed)
Important Message  Patient Details  Name: Mark Terry. MRN: [id number] Date of Birth: [birth year]   Medicare Important Message Given:  Yes    Marshell Garfinkel, RN 11/07/2015, 8:33 AM

## 2015-11-07 NOTE — Progress Notes (Signed)
Physical Therapy Treatment Patient Details Name: Mark Terry. MRN: [id number] DOB: 02-21-1935 Today's Date: 11/07/2015    History of Present Illness Patient is a 81 y.o. male admitted on 04 November 2015 for R ankle arthrodesis. Patient has had bilateral knee and shoulder replacements.    PT Comments    Pt continues to struggle with all mobility requiring Mod to Max A for all bed mobility and sit to/from stand transfers. Pt unable to take steps at this time due to unsteadiness in static stand. Pt able to tolerate stand with assist for balance and compliance with R non weightbearing status for up to 30 seconds x 2. Pt participates well with both supine and seated exercises and encouraged performance multiple times throughout the day. Pt requires use of bed pan after stand requiring assist of 2 for all bed mobility as well as use of trapeze and declining bed for repositioning upward. Plan to see pt again this afternoon if pt does not transfer to skilled nursing facility for continued work on strengthening and endurance to improve all functional mobility.   Follow Up Recommendations  SNF     Equipment Recommendations  None recommended by PT    Recommendations for Other Services       Precautions / Restrictions Restrictions Weight Bearing Restrictions: Yes RLE Weight Bearing: Non weight bearing    Mobility  Bed Mobility Overal bed mobility: Needs Assistance Bed Mobility: Rolling;Supine to Sit;Sit to Supine Rolling: Min assist;+2 for safety/equipment (and use of rail for donning bed pan)   Supine to sit: Mod assist;HOB elevated Sit to supine: HOB elevated;Mod assist   General bed mobility comments: Assist at both LEs and trunk; use of trapeze and mod A x 2 to reposition upward in bed.  Transfers Overall transfer level: Needs assistance Equipment used: Rolling Cullen (2 wheeled) Transfers: Sit to/from Stand Sit to Stand: Mod assist;Max assist         General transfer  comment: Unsteady in stand and leans against bed for support; heavy cues for sequence/technieque and assist to maintain RLE NWB. Performed 2x with up to 30 second stand  Ambulation/Gait             General Gait Details: uanble at thtis time   Financial trader Rankin (Stroke Patients Only)       Balance Overall balance assessment: Needs assistance Sitting-balance support: Bilateral upper extremity supported Sitting balance-Leahy Scale: Fair     Standing balance support: Bilateral upper extremity supported Standing balance-Leahy Scale: Poor Standing balance comment: Requires support of therapist and bed                    Cognition Arousal/Alertness: Awake/alert Behavior During Therapy: WFL for tasks assessed/performed Overall Cognitive Status: Within Functional Limits for tasks assessed                      Exercises General Exercises - Lower Extremity Ankle Circles/Pumps: Left;AROM;20 reps;Supine Quad Sets: Strengthening;Both;20 reps;Supine Gluteal Sets: Strengthening;Both;20 reps;Supine Short Arc Quad: AROM;Both;20 reps;Supine Long Arc Quad: AROM;Both;10 reps;Seated Heel Slides: AAROM;Both;20 reps;Supine Hip ABduction/ADduction: AAROM;Right;20 reps;Supine (AROM L) Straight Leg Raises: AROM;Both;10 reps;Supine (Min Guard assist under R) Hip Flexion/Marching: AROM;Both;10 reps;Seated    General Comments        Pertinent Vitals/Pain Pain Assessment: 0-10 Pain Score: 6  Pain Location: RLE/foot,ankle Pain Intervention(s): Monitored during session;Premedicated before session;Other (comment);Limited activity within  patient's tolerance (bone stimulator)    Home Living                      Prior Function            PT Goals (current goals can now be found in the care plan section) Progress towards PT goals: Progressing toward goals (slowly)    Frequency  BID    PT Plan Current plan remains  appropriate    Co-evaluation             End of Session Equipment Utilized During Treatment: Gait belt Activity Tolerance: Patient limited by fatigue;Patient limited by pain (Weakness) Patient left: in bed;with call bell/phone within reach;with bed alarm set;Other (comment) (on bed pan; nursing to check back)     Time: [id number] PT Time Calculation (min) (ACUTE ONLY): 42 min  Charges:  $Therapeutic Exercise: 8-22 mins $Therapeutic Activity: 23-37 mins                    G Codes:      Charlaine Dalton, PTA 11/07/2015, 11:05 AM

## 2015-11-07 NOTE — Progress Notes (Signed)
[facility] NAME: Mark Terry    MR#:  [id number]  DATE OF BIRTH:  Mar 29, 1935  SUBJECTIVE:  CHIEF COMPLAINT:  No chief complaint on file. Patient is a 81 year old Caucasian male with past history significant for history of arthritis, joint pain, back pain, gastroesophageal reflux disease, lower extremity edema, diabetes, CHF who presented with right ankle arthrosis and underwent ORIF by Dr. Vickki Muff on 11/04/2015. Patient complains of less pain in the right lower extremity since he was initiated on oxycodone as needed. He was evaluated by physical therapist and recommended skilled nursing facility placement for rehabilitation. Complains of severe constipation, some upper abdominal discomfort, nausea due to constipation, patient had enema and Movantic with 3 bowel movements earlier today  Review of Systems  Constitutional: Negative for fever, chills and weight loss.  HENT: Negative for congestion.   Eyes: Negative for blurred vision and double vision.  Respiratory: Negative for cough, sputum production, shortness of breath and wheezing.   Cardiovascular: Negative for chest pain, palpitations, orthopnea, leg swelling and PND.  Gastrointestinal: Positive for nausea, abdominal pain and constipation. Negative for vomiting, diarrhea and blood in stool.  Genitourinary: Negative for dysuria, urgency, frequency and hematuria.  Musculoskeletal: Positive for joint pain. Negative for falls.  Neurological: Negative for dizziness, tremors, focal weakness and headaches.  Endo/Heme/Allergies: Does not bruise/bleed easily.  Psychiatric/Behavioral: Negative for depression. The patient does not have insomnia.     VITAL SIGNS: Blood pressure 104/83, pulse 72, temperature 97.7 F (36.5 C), temperature source Oral, resp. rate 18, height 5' 1.5" (1.562 m), weight 101.152 kg (223 lb), SpO2 94 %.  PHYSICAL EXAMINATION:   GENERAL:  81 y.o.-year-old  patient lying in the bed in no distress today, but uncomfortable today due to nausea and constipation, some abdominal pain  EYES: Pupils equal, round, reactive to light and accommodation. No scleral icterus. Extraocular muscles intact.  HEENT: Head atraumatic, normocephalic. Oropharynx and nasopharynx clear.  NECK:  Supple, no jugular venous distention. No thyroid enlargement, no tenderness.  LUNGS: Normal breath sounds bilaterally, no wheezing, rales,rhonchi or crepitation. No use of accessory muscles of respiration.  CARDIOVASCULAR: S1, S2 normal. No murmurs, rubs, or gallops.  ABDOMEN: Soft, upper abdominal discomfort on palpation but no rebound or guarding, nondistended. Bowel sounds present. No organomegaly or mass.  EXTREMITIES: No pedal edema, cyanosis, or clubbing. Right lower extremity is immobilized.  NEUROLOGIC: Cranial nerves II through XII are intact. Muscle strength 5/5 in all extremities. Sensation intact. Gait not checked.  PSYCHIATRIC: The patient is alert and oriented x 3.  SKIN: No obvious rash, lesion, or ulcer.   ORDERS/RESULTS REVIEWED:   CBC  Recent Labs Lab 11/04/15 1304 11/07/15 0341  WBC 5.5 8.1  HGB 11.9* 10.4*  HCT 34.8* 30.4*  PLT 126* 119*  MCV 91.3 91.7  MCH 31.2 31.4  MCHC 34.2 34.2  RDW 14.5 14.5   ------------------------------------------------------------------------------------------------------------------  Chemistries   Recent Labs Lab 11/04/15 1304 11/07/15 0341  NA  --  138  K  --  4.4  CL  --  105  CO2  --  25  GLUCOSE  --  139*  BUN  --  21*  CREATININE 1.38* 1.08  CALCIUM  --  9.1   ------------------------------------------------------------------------------------------------------------------ estimated creatinine clearance is 57 mL/min (by C-G formula based on Cr of 1.08). ------------------------------------------------------------------------------------------------------------------ No results for input(s): TSH,  T4TOTAL, T3FREE, THYROIDAB in the last 72 hours.  Invalid input(s): FREET3  Cardiac Enzymes  Recent Labs  Lab 11/05/15 1352 11/05/15 1717 11/05/15 2221  TROPONINI <0.03 <0.03 <0.03   ------------------------------------------------------------------------------------------------------------------ Invalid input(s): POCBNP ---------------------------------------------------------------------------------------------------------------  RADIOLOGY: Ct Angio Chest Pe W/cm &/or Wo Cm  11/05/2015  CLINICAL DATA:  Sudden onset midsternal chest pain starting this morning. Right ankle surgery on 11/04/2015. EXAM: CT ANGIOGRAPHY CHEST WITH CONTRAST TECHNIQUE: Multidetector CT imaging of the chest was performed using the standard protocol during bolus administration of intravenous contrast. Multiplanar CT image reconstructions and MIPs were obtained to evaluate the vascular anatomy. CONTRAST:  75 cc Isovue 370 COMPARISON:  None. FINDINGS: Mediastinum/Lymph Nodes: There is no pulmonary embolism identified within the main, lobar or segmental pulmonary arteries bilaterally. Some of the peripheral segmental and subsegmental pulmonary arteries are difficult to definitively characterize due to mild patient breathing motion artifact. Scattered atherosclerotic changes noted along the walls of the normal-caliber thoracic aorta. No aortic aneurysm or dissection. Heart size is normal. No pericardial effusion. Coronary artery calcifications noted. Patient is status post CABG. Lungs/Pleura: Mild atelectasis and/or scarring within each lung. No pneumonia or pleural effusion. No pneumothorax. Trachea and central bronchi are unremarkable. Upper abdomen: No acute findings. Small adrenal masses bilaterally, too small to definitively characterize, incompletely imaged on the left. Probable fatty infiltration of the liver, also incompletely imaged. Musculoskeletal: No acute or suspicious osseous lesion. Degenerative changes  throughout the thoracic spine, at least moderate in degree. Status post median sternotomy for CABG. Bilateral shoulder replacement. Superficial soft tissues are unremarkable. Review of the MIP images confirms the above findings. IMPRESSION: 1. No pulmonary embolism seen, with mild study limitations detailed above. 2.  No aortic aneurysm or dissection. 3. Heart size is normal. No pericardial effusion. Coronary artery calcifications. Status post CABG. 4. Mild scarring/ atelectasis bilaterally. Lungs otherwise clear. No evidence of pneumonia. No pleural effusion. 5. Bilateral small adrenal masses, too small to definitively characterize, incompletely imaged on the left. Consider nonemergent adrenal protocol MRI at some point to ensure benignity. 6.  Probable fatty infiltration of the liver. Electronically Signed   By: Franki Cabot M.D.   On: 11/05/2015 14:37    EKG:  Orders placed or performed during the hospital encounter of 11/04/15  . EKG 12-Lead  . EKG 12-Lead    ASSESSMENT AND PLAN:  Active Problems:   Arthritis of ankle or foot, degenerative #1 renal insufficiency, chronic, Improved with therapy #2. Right ankle arthrosis status post ORIF 28th of April 2017 by Dr. Vickki Muff, patient was evaluated by physical therapist and recommended rehabilitation placement in skilled nursing facility, continue oxycodone as needed, feels more comfortable today #3 anemia, hemoglobin is stable postoperatively. #4 diabetes mellitus, well controlled with hemoglobin A1c 6.9, continue metformin, glyburide, blood glucose levels are ranging between 115-230, likely stress/pain related, continue sliding scale insulin, patient will resume his outpatient medications upon discharge home. #5. Hyperlipidemia, continue outpatient medications #6, sharp, midsternal chest pain, sudden onset, EKG was normal, as well as cardiac enzymes 3. CT angiogram of the chest showed no pulmonary embolism. Etiology remains unclear, will need to be  evaluated as outpatient. Resolved. Reassess if recurrent outpatient 6. Severe constipation, possibly opiate-induced, patient was given an enema, Movantik with 3 episodes of bowel movements earlier today, which and is to continue Colace, senna, MiraLAX as needed as outpatient as long as he is on opiate therapy Management plans discussed with the patient, family and they are in agreement.   DRUG ALLERGIES:  Allergies  Allergen Reactions  . Codeine Sulfate     Patient denies  . Darvon [Propoxyphene Hcl] Other (  See Comments)    Short of breath  . Demerol [Meperidine] Other (See Comments)    Short of breath    CODE STATUS:     Code Status Orders        Start     Ordered   11/04/15 1340  Full code   Continuous     11/04/15 1339    Code Status History    Date Active Date Inactive Code Status Order ID Comments User Context   05/30/2012  5:33 PM 05/31/2012  2:16 PM Full Code [id number]  Evlyn Clines, RN Inpatient      TOTAL TIME TAKING CARE OF THIS PATIENT: 35 minutes.  Discussed with the `Pharmacist in regards to opiate-induced constipation  Jaiyanna Safran M.D on 11/07/2015 at 12:11 PM  Between 7am to 6pm - Pager - [phone]  After 6pm go to [email or web address] - password [email or web address] [facility]  Office  [phone]  CC: Primary care physician; [name], MD

## 2015-11-07 NOTE — Clinical Social Work Placement (Signed)
   CLINICAL SOCIAL WORK PLACEMENT  NOTE  Date:  11/07/2015  Patient Details  Name: Mark Terry. MRN: [id number] Date of Birth: [birth year]  Clinical Social Work is seeking post-discharge placement for this patient at the [place] level of care (*CSW will initial, date and re-position this form in  chart as items are completed):  Yes   Patient/family provided with [facility] Work Department's list of facilities offering this level of care within the geographic area requested by the patient (or if unable, by the patient's family).  Yes   Patient/family informed of their freedom to choose among providers that offer the needed level of care, that participate in Medicare, Medicaid or managed care program needed by the patient, have an available bed and are willing to accept the patient.  Yes   Patient/family informed of [facility]'s ownership interest in [facility] and [facility], as well as of the fact that they are under no obligation to receive care at these facilities.  PASRR submitted to EDS on       PASRR number received on       Existing PASRR number confirmed on 11/04/15     FL2 transmitted to all facilities in geographic area requested by pt/family on 11/04/15     FL2 transmitted to all facilities within larger geographic area on       Patient informed that his/her managed care company has contracts with or will negotiate with certain facilities, including the following:        Yes   Patient/family informed of bed offers received.  Patient chooses bed at  [facility] )     Physician recommends and patient chooses bed at      Patient to be transferred to  [facility] ) on 11/07/15.  Patient to be transferred to facility by  [facility] )     Patient family notified on 11/07/15 of transfer.  Name of family member notified:   ([facility] left patient's girlfriend Garnetta Buddy a voicemail. )     PHYSICIAN        Additional Comment:    _______________________________________________ Loralyn Freshwater, LCSW 11/07/2015, 2:35 PM

## 2015-11-07 NOTE — Progress Notes (Signed)
Spoke with [facility], [employer] rep at [phone], to notify of non-emergent EMS transport.  Auth notification reference given as [id number].   Service date range good from 11/07/15 - 02/06/16.   Gap exception requested to determine if services can be considered at an in-network level.

## 2015-11-07 NOTE — Progress Notes (Signed)
Report called to [facility] at [place]. EMS called. Waiting on transportation.

## 2015-11-07 NOTE — Discharge Instructions (Signed)
°  [address] [place]   Take your medication as prescribed.  Pain medication should be taken only as needed.  Keep the dressing clean, dry and intact.  Keep your foot elevated above the heart level for the first 48 hours.  Walking to the bathroom and brief periods of walking are acceptable, unless we have instructed you to be non-weight bearing.  Always wear your post-op shoe when walking.  Always use your crutches if you are to be non-weight bearing.  Do not take a shower. Baths are permissible as long as the foot is kept out of the water.   Every hour you are awake:  Bend your knee 15 times.  Call [facility] [phone]) if any of the following problems occur: You develop a temperature or fever. The bandage becomes saturated with blood. Medication does not stop your pain. Injury of the foot occurs. Any symptoms of infection including redness, odor, or red streaks running from wound.

## 2015-11-08 DIAGNOSIS — G4733 Obstructive sleep apnea (adult) (pediatric): Secondary | ICD-10-CM | POA: Diagnosis not present

## 2015-11-08 DIAGNOSIS — E119 Type 2 diabetes mellitus without complications: Secondary | ICD-10-CM | POA: Diagnosis not present

## 2015-11-08 DIAGNOSIS — M159 Polyosteoarthritis, unspecified: Secondary | ICD-10-CM | POA: Diagnosis not present

## 2015-11-08 DIAGNOSIS — K5900 Constipation, unspecified: Secondary | ICD-10-CM | POA: Diagnosis not present

## 2015-11-08 DIAGNOSIS — I2510 Atherosclerotic heart disease of native coronary artery without angina pectoris: Secondary | ICD-10-CM | POA: Diagnosis not present

## 2015-11-08 LAB — GLUCOSE, CAPILLARY
GLUCOSE-CAPILLARY: 87 mg/dL (ref 65–99)
GLUCOSE-CAPILLARY: 133 mg/dL — AB (ref 65–99)
GLUCOSE-CAPILLARY: 113 mg/dL — AB (ref 65–99)
GLUCOSE-CAPILLARY: 131 mg/dL — AB (ref 65–99)

## 2015-11-09 LAB — GLUCOSE, CAPILLARY
GLUCOSE-CAPILLARY: 159 mg/dL — AB (ref 65–99)
Glucose-Capillary: 126 mg/dL — ABNORMAL HIGH (ref 65–99)
Glucose-Capillary: 144 mg/dL — ABNORMAL HIGH (ref 65–99)
Glucose-Capillary: 83 mg/dL (ref 65–99)
Glucose-Capillary: 132 mg/dL — ABNORMAL HIGH (ref 65–99)

## 2015-11-10 DIAGNOSIS — E119 Type 2 diabetes mellitus without complications: Secondary | ICD-10-CM | POA: Diagnosis not present

## 2015-11-10 LAB — GLUCOSE, CAPILLARY
GLUCOSE-CAPILLARY: 177 mg/dL — AB (ref 65–99)
GLUCOSE-CAPILLARY: 76 mg/dL (ref 65–99)
Glucose-Capillary: 115 mg/dL — ABNORMAL HIGH (ref 65–99)
Glucose-Capillary: 128 mg/dL — ABNORMAL HIGH (ref 65–99)

## 2015-11-11 DIAGNOSIS — E119 Type 2 diabetes mellitus without complications: Secondary | ICD-10-CM | POA: Diagnosis not present

## 2015-11-11 LAB — GLUCOSE, CAPILLARY
GLUCOSE-CAPILLARY: 117 mg/dL — AB (ref 65–99)
Glucose-Capillary: 161 mg/dL — ABNORMAL HIGH (ref 65–99)
Glucose-Capillary: 166 mg/dL — ABNORMAL HIGH (ref 65–99)
Glucose-Capillary: 100 mg/dL — ABNORMAL HIGH (ref 65–99)
Glucose-Capillary: 148 mg/dL — ABNORMAL HIGH (ref 65–99)

## 2015-11-12 DIAGNOSIS — E119 Type 2 diabetes mellitus without complications: Secondary | ICD-10-CM | POA: Diagnosis not present

## 2015-11-12 LAB — GLUCOSE, CAPILLARY
Glucose-Capillary: 142 mg/dL — ABNORMAL HIGH (ref 65–99)
Glucose-Capillary: 62 mg/dL — ABNORMAL LOW (ref 65–99)
Glucose-Capillary: 77 mg/dL (ref 65–99)
Glucose-Capillary: 147 mg/dL — ABNORMAL HIGH (ref 65–99)

## 2015-11-13 DIAGNOSIS — E119 Type 2 diabetes mellitus without complications: Secondary | ICD-10-CM | POA: Diagnosis not present

## 2015-11-13 LAB — GLUCOSE, CAPILLARY
GLUCOSE-CAPILLARY: 144 mg/dL — AB (ref 65–99)
GLUCOSE-CAPILLARY: 152 mg/dL — AB (ref 65–99)
GLUCOSE-CAPILLARY: 232 mg/dL — AB (ref 65–99)
Glucose-Capillary: 113 mg/dL — ABNORMAL HIGH (ref 65–99)

## 2015-11-14 DIAGNOSIS — E119 Type 2 diabetes mellitus without complications: Secondary | ICD-10-CM | POA: Diagnosis not present

## 2015-11-14 LAB — GLUCOSE, CAPILLARY
GLUCOSE-CAPILLARY: 97 mg/dL (ref 65–99)
Glucose-Capillary: 117 mg/dL — ABNORMAL HIGH (ref 65–99)
Glucose-Capillary: 164 mg/dL — ABNORMAL HIGH (ref 65–99)
Glucose-Capillary: 148 mg/dL — ABNORMAL HIGH (ref 65–99)

## 2015-11-15 DIAGNOSIS — E119 Type 2 diabetes mellitus without complications: Secondary | ICD-10-CM | POA: Diagnosis not present

## 2015-11-15 DIAGNOSIS — M25571 Pain in right ankle and joints of right foot: Secondary | ICD-10-CM | POA: Diagnosis not present

## 2015-11-15 DIAGNOSIS — M19071 Primary osteoarthritis, right ankle and foot: Secondary | ICD-10-CM | POA: Diagnosis not present

## 2015-11-15 LAB — GLUCOSE, CAPILLARY
GLUCOSE-CAPILLARY: 118 mg/dL — AB (ref 65–99)
GLUCOSE-CAPILLARY: 157 mg/dL — AB (ref 65–99)
GLUCOSE-CAPILLARY: 137 mg/dL — AB (ref 65–99)
Glucose-Capillary: 164 mg/dL — ABNORMAL HIGH (ref 65–99)

## 2015-11-16 DIAGNOSIS — E119 Type 2 diabetes mellitus without complications: Secondary | ICD-10-CM | POA: Diagnosis not present

## 2015-11-16 LAB — GLUCOSE, CAPILLARY
GLUCOSE-CAPILLARY: 166 mg/dL — AB (ref 65–99)
GLUCOSE-CAPILLARY: 150 mg/dL — AB (ref 65–99)
GLUCOSE-CAPILLARY: 181 mg/dL — AB (ref 65–99)
Glucose-Capillary: 143 mg/dL — ABNORMAL HIGH (ref 65–99)

## 2015-11-17 ENCOUNTER — Ambulatory Visit: Payer: Medicare Other | Admitting: Family Medicine

## 2015-11-17 DIAGNOSIS — E119 Type 2 diabetes mellitus without complications: Secondary | ICD-10-CM | POA: Diagnosis not present

## 2015-11-17 LAB — GLUCOSE, CAPILLARY
Glucose-Capillary: 115 mg/dL — ABNORMAL HIGH (ref 65–99)
Glucose-Capillary: 218 mg/dL — ABNORMAL HIGH (ref 65–99)

## 2015-11-18 DIAGNOSIS — E119 Type 2 diabetes mellitus without complications: Secondary | ICD-10-CM | POA: Diagnosis not present

## 2015-11-18 LAB — GLUCOSE, CAPILLARY
GLUCOSE-CAPILLARY: 171 mg/dL — AB (ref 65–99)
GLUCOSE-CAPILLARY: 150 mg/dL — AB (ref 65–99)
GLUCOSE-CAPILLARY: 193 mg/dL — AB (ref 65–99)
Glucose-Capillary: 120 mg/dL — ABNORMAL HIGH (ref 65–99)
Glucose-Capillary: 154 mg/dL — ABNORMAL HIGH (ref 65–99)
Glucose-Capillary: 176 mg/dL — ABNORMAL HIGH (ref 65–99)

## 2015-11-18 LAB — URINALYSIS COMPLETE WITH MICROSCOPIC (ARMC ONLY)
BACTERIA UA: NONE SEEN
Bilirubin Urine: NEGATIVE
Glucose, UA: NEGATIVE mg/dL
Hgb urine dipstick: NEGATIVE
Ketones, ur: NEGATIVE mg/dL
Leukocytes, UA: NEGATIVE
Nitrite: NEGATIVE
PROTEIN: NEGATIVE mg/dL
SPECIFIC GRAVITY, URINE: 1.017 (ref 1.005–1.030)
SQUAMOUS EPITHELIAL / LPF: NONE SEEN
pH: 5 (ref 5.0–8.0)

## 2015-11-19 DIAGNOSIS — E119 Type 2 diabetes mellitus without complications: Secondary | ICD-10-CM | POA: Diagnosis not present

## 2015-11-19 LAB — GLUCOSE, CAPILLARY
Glucose-Capillary: 95 mg/dL (ref 65–99)
Glucose-Capillary: 190 mg/dL — ABNORMAL HIGH (ref 65–99)
Glucose-Capillary: 126 mg/dL — ABNORMAL HIGH (ref 65–99)
Glucose-Capillary: 171 mg/dL — ABNORMAL HIGH (ref 65–99)

## 2015-11-20 DIAGNOSIS — E119 Type 2 diabetes mellitus without complications: Secondary | ICD-10-CM | POA: Diagnosis not present

## 2015-11-20 LAB — GLUCOSE, CAPILLARY
GLUCOSE-CAPILLARY: 115 mg/dL — AB (ref 65–99)
GLUCOSE-CAPILLARY: 229 mg/dL — AB (ref 65–99)
GLUCOSE-CAPILLARY: 185 mg/dL — AB (ref 65–99)
Glucose-Capillary: 175 mg/dL — ABNORMAL HIGH (ref 65–99)

## 2015-11-20 LAB — URINE CULTURE: CULTURE: NO GROWTH

## 2015-11-21 DIAGNOSIS — E119 Type 2 diabetes mellitus without complications: Secondary | ICD-10-CM | POA: Diagnosis not present

## 2015-11-22 DIAGNOSIS — E119 Type 2 diabetes mellitus without complications: Secondary | ICD-10-CM | POA: Diagnosis not present

## 2015-11-22 LAB — GLUCOSE, CAPILLARY
GLUCOSE-CAPILLARY: 149 mg/dL — AB (ref 65–99)
GLUCOSE-CAPILLARY: 121 mg/dL — AB (ref 65–99)
GLUCOSE-CAPILLARY: 149 mg/dL — AB (ref 65–99)
Glucose-Capillary: 142 mg/dL — ABNORMAL HIGH (ref 65–99)
Glucose-Capillary: 131 mg/dL — ABNORMAL HIGH (ref 65–99)
Glucose-Capillary: 140 mg/dL — ABNORMAL HIGH (ref 65–99)

## 2015-11-23 LAB — GLUCOSE, CAPILLARY
GLUCOSE-CAPILLARY: 120 mg/dL — AB (ref 65–99)
GLUCOSE-CAPILLARY: 183 mg/dL — AB (ref 65–99)
Glucose-Capillary: 151 mg/dL — ABNORMAL HIGH (ref 65–99)

## 2015-11-24 DIAGNOSIS — E119 Type 2 diabetes mellitus without complications: Secondary | ICD-10-CM | POA: Diagnosis not present

## 2015-11-24 LAB — GLUCOSE, CAPILLARY
GLUCOSE-CAPILLARY: 199 mg/dL — AB (ref 65–99)
GLUCOSE-CAPILLARY: 217 mg/dL — AB (ref 65–99)
Glucose-Capillary: 112 mg/dL — ABNORMAL HIGH (ref 65–99)
Glucose-Capillary: 189 mg/dL — ABNORMAL HIGH (ref 65–99)
Glucose-Capillary: 190 mg/dL — ABNORMAL HIGH (ref 65–99)
Glucose-Capillary: 156 mg/dL — ABNORMAL HIGH (ref 65–99)

## 2015-11-25 DIAGNOSIS — E119 Type 2 diabetes mellitus without complications: Secondary | ICD-10-CM | POA: Diagnosis not present

## 2015-11-25 LAB — GLUCOSE, CAPILLARY
GLUCOSE-CAPILLARY: 131 mg/dL — AB (ref 65–99)
Glucose-Capillary: 159 mg/dL — ABNORMAL HIGH (ref 65–99)
Glucose-Capillary: 180 mg/dL — ABNORMAL HIGH (ref 65–99)
Glucose-Capillary: 166 mg/dL — ABNORMAL HIGH (ref 65–99)

## 2015-11-26 DIAGNOSIS — E119 Type 2 diabetes mellitus without complications: Secondary | ICD-10-CM | POA: Diagnosis not present

## 2015-11-26 LAB — GLUCOSE, CAPILLARY
GLUCOSE-CAPILLARY: 187 mg/dL — AB (ref 65–99)
GLUCOSE-CAPILLARY: 242 mg/dL — AB (ref 65–99)
Glucose-Capillary: 279 mg/dL — ABNORMAL HIGH (ref 65–99)
Glucose-Capillary: 192 mg/dL — ABNORMAL HIGH (ref 65–99)

## 2015-11-27 DIAGNOSIS — E119 Type 2 diabetes mellitus without complications: Secondary | ICD-10-CM | POA: Diagnosis not present

## 2015-11-27 LAB — GLUCOSE, CAPILLARY
GLUCOSE-CAPILLARY: 215 mg/dL — AB (ref 65–99)
GLUCOSE-CAPILLARY: 195 mg/dL — AB (ref 65–99)
Glucose-Capillary: 164 mg/dL — ABNORMAL HIGH (ref 65–99)

## 2015-11-28 DIAGNOSIS — E119 Type 2 diabetes mellitus without complications: Secondary | ICD-10-CM | POA: Diagnosis not present

## 2015-11-28 LAB — GLUCOSE, CAPILLARY
GLUCOSE-CAPILLARY: 177 mg/dL — AB (ref 65–99)
Glucose-Capillary: 159 mg/dL — ABNORMAL HIGH (ref 65–99)
Glucose-Capillary: 278 mg/dL — ABNORMAL HIGH (ref 65–99)
Glucose-Capillary: 228 mg/dL — ABNORMAL HIGH (ref 65–99)
Glucose-Capillary: 149 mg/dL — ABNORMAL HIGH (ref 65–99)
Glucose-Capillary: 252 mg/dL — ABNORMAL HIGH (ref 65–99)

## 2015-11-29 DIAGNOSIS — E119 Type 2 diabetes mellitus without complications: Secondary | ICD-10-CM | POA: Diagnosis not present

## 2015-11-29 LAB — GLUCOSE, CAPILLARY
GLUCOSE-CAPILLARY: 244 mg/dL — AB (ref 65–99)
Glucose-Capillary: 170 mg/dL — ABNORMAL HIGH (ref 65–99)
Glucose-Capillary: 183 mg/dL — ABNORMAL HIGH (ref 65–99)
Glucose-Capillary: 217 mg/dL — ABNORMAL HIGH (ref 65–99)

## 2015-11-30 DIAGNOSIS — E119 Type 2 diabetes mellitus without complications: Secondary | ICD-10-CM | POA: Diagnosis not present

## 2015-11-30 LAB — GLUCOSE, CAPILLARY
Glucose-Capillary: 155 mg/dL — ABNORMAL HIGH (ref 65–99)
Glucose-Capillary: 269 mg/dL — ABNORMAL HIGH (ref 65–99)

## 2015-12-01 ENCOUNTER — Ambulatory Visit: Payer: Medicare Other | Admitting: Family Medicine

## 2015-12-01 DIAGNOSIS — E119 Type 2 diabetes mellitus without complications: Secondary | ICD-10-CM | POA: Diagnosis not present

## 2015-12-01 LAB — GLUCOSE, CAPILLARY
GLUCOSE-CAPILLARY: 142 mg/dL — AB (ref 65–99)
GLUCOSE-CAPILLARY: 259 mg/dL — AB (ref 65–99)
Glucose-Capillary: 145 mg/dL — ABNORMAL HIGH (ref 65–99)
Glucose-Capillary: 214 mg/dL — ABNORMAL HIGH (ref 65–99)
Glucose-Capillary: 194 mg/dL — ABNORMAL HIGH (ref 65–99)
Glucose-Capillary: 162 mg/dL — ABNORMAL HIGH (ref 65–99)

## 2015-12-02 DIAGNOSIS — E119 Type 2 diabetes mellitus without complications: Secondary | ICD-10-CM | POA: Diagnosis not present

## 2015-12-02 LAB — GLUCOSE, CAPILLARY
GLUCOSE-CAPILLARY: 158 mg/dL — AB (ref 65–99)
Glucose-Capillary: 174 mg/dL — ABNORMAL HIGH (ref 65–99)
Glucose-Capillary: 203 mg/dL — ABNORMAL HIGH (ref 65–99)

## 2015-12-03 LAB — GLUCOSE, CAPILLARY
GLUCOSE-CAPILLARY: 163 mg/dL — AB (ref 65–99)
Glucose-Capillary: 204 mg/dL — ABNORMAL HIGH (ref 65–99)
Glucose-Capillary: 212 mg/dL — ABNORMAL HIGH (ref 65–99)
Glucose-Capillary: 216 mg/dL — ABNORMAL HIGH (ref 65–99)

## 2015-12-04 DIAGNOSIS — E119 Type 2 diabetes mellitus without complications: Secondary | ICD-10-CM | POA: Diagnosis not present

## 2015-12-04 LAB — GLUCOSE, CAPILLARY
GLUCOSE-CAPILLARY: 123 mg/dL — AB (ref 65–99)
GLUCOSE-CAPILLARY: 183 mg/dL — AB (ref 65–99)
Glucose-Capillary: 246 mg/dL — ABNORMAL HIGH (ref 65–99)

## 2015-12-05 DIAGNOSIS — E119 Type 2 diabetes mellitus without complications: Secondary | ICD-10-CM | POA: Diagnosis not present

## 2015-12-05 LAB — GLUCOSE, CAPILLARY
GLUCOSE-CAPILLARY: 173 mg/dL — AB (ref 65–99)
Glucose-Capillary: 134 mg/dL — ABNORMAL HIGH (ref 65–99)
Glucose-Capillary: 164 mg/dL — ABNORMAL HIGH (ref 65–99)
Glucose-Capillary: 200 mg/dL — ABNORMAL HIGH (ref 65–99)

## 2015-12-06 DIAGNOSIS — M19071 Primary osteoarthritis, right ankle and foot: Secondary | ICD-10-CM | POA: Diagnosis not present

## 2015-12-06 DIAGNOSIS — B351 Tinea unguium: Secondary | ICD-10-CM | POA: Diagnosis not present

## 2015-12-06 DIAGNOSIS — M79675 Pain in left toe(s): Secondary | ICD-10-CM | POA: Diagnosis not present

## 2015-12-06 DIAGNOSIS — E119 Type 2 diabetes mellitus without complications: Secondary | ICD-10-CM | POA: Diagnosis not present

## 2015-12-06 LAB — GLUCOSE, CAPILLARY
Glucose-Capillary: 160 mg/dL — ABNORMAL HIGH (ref 65–99)
Glucose-Capillary: 225 mg/dL — ABNORMAL HIGH (ref 65–99)

## 2015-12-07 LAB — GLUCOSE, CAPILLARY
GLUCOSE-CAPILLARY: 176 mg/dL — AB (ref 65–99)
GLUCOSE-CAPILLARY: 148 mg/dL — AB (ref 65–99)
Glucose-Capillary: 179 mg/dL — ABNORMAL HIGH (ref 65–99)
Glucose-Capillary: 231 mg/dL — ABNORMAL HIGH (ref 65–99)

## 2015-12-09 DIAGNOSIS — M1990 Unspecified osteoarthritis, unspecified site: Secondary | ICD-10-CM | POA: Diagnosis not present

## 2015-12-09 DIAGNOSIS — G4733 Obstructive sleep apnea (adult) (pediatric): Secondary | ICD-10-CM | POA: Diagnosis not present

## 2015-12-10 DIAGNOSIS — N184 Chronic kidney disease, stage 4 (severe): Secondary | ICD-10-CM | POA: Diagnosis not present

## 2015-12-10 DIAGNOSIS — E1122 Type 2 diabetes mellitus with diabetic chronic kidney disease: Secondary | ICD-10-CM | POA: Diagnosis not present

## 2015-12-10 DIAGNOSIS — K219 Gastro-esophageal reflux disease without esophagitis: Secondary | ICD-10-CM | POA: Diagnosis not present

## 2015-12-10 DIAGNOSIS — M19071 Primary osteoarthritis, right ankle and foot: Secondary | ICD-10-CM | POA: Diagnosis not present

## 2015-12-10 DIAGNOSIS — I129 Hypertensive chronic kidney disease with stage 1 through stage 4 chronic kidney disease, or unspecified chronic kidney disease: Secondary | ICD-10-CM | POA: Diagnosis not present

## 2015-12-10 DIAGNOSIS — J449 Chronic obstructive pulmonary disease, unspecified: Secondary | ICD-10-CM | POA: Diagnosis not present

## 2015-12-10 DIAGNOSIS — M4802 Spinal stenosis, cervical region: Secondary | ICD-10-CM | POA: Diagnosis not present

## 2015-12-10 DIAGNOSIS — Z981 Arthrodesis status: Secondary | ICD-10-CM | POA: Diagnosis not present

## 2015-12-10 DIAGNOSIS — G4733 Obstructive sleep apnea (adult) (pediatric): Secondary | ICD-10-CM | POA: Diagnosis not present

## 2015-12-10 DIAGNOSIS — I25119 Atherosclerotic heart disease of native coronary artery with unspecified angina pectoris: Secondary | ICD-10-CM | POA: Diagnosis not present

## 2015-12-10 DIAGNOSIS — D649 Anemia, unspecified: Secondary | ICD-10-CM | POA: Diagnosis not present

## 2015-12-10 DIAGNOSIS — E785 Hyperlipidemia, unspecified: Secondary | ICD-10-CM | POA: Diagnosis not present

## 2015-12-13 ENCOUNTER — Ambulatory Visit (INDEPENDENT_AMBULATORY_CARE_PROVIDER_SITE_OTHER): Payer: Medicare Other | Admitting: Urology

## 2015-12-13 ENCOUNTER — Telehealth: Payer: Self-pay | Admitting: Family Medicine

## 2015-12-13 ENCOUNTER — Encounter: Payer: Self-pay | Admitting: Urology

## 2015-12-13 VITALS — BP 99/65 | HR 74 | Ht 61.0 in | Wt 199.0 lb

## 2015-12-13 DIAGNOSIS — R350 Frequency of micturition: Secondary | ICD-10-CM | POA: Diagnosis not present

## 2015-12-13 DIAGNOSIS — Z981 Arthrodesis status: Secondary | ICD-10-CM | POA: Diagnosis not present

## 2015-12-13 DIAGNOSIS — K219 Gastro-esophageal reflux disease without esophagitis: Secondary | ICD-10-CM | POA: Diagnosis not present

## 2015-12-13 DIAGNOSIS — R351 Nocturia: Secondary | ICD-10-CM

## 2015-12-13 DIAGNOSIS — E1122 Type 2 diabetes mellitus with diabetic chronic kidney disease: Secondary | ICD-10-CM | POA: Diagnosis not present

## 2015-12-13 DIAGNOSIS — D649 Anemia, unspecified: Secondary | ICD-10-CM | POA: Diagnosis not present

## 2015-12-13 DIAGNOSIS — N184 Chronic kidney disease, stage 4 (severe): Secondary | ICD-10-CM | POA: Diagnosis not present

## 2015-12-13 DIAGNOSIS — I25119 Atherosclerotic heart disease of native coronary artery with unspecified angina pectoris: Secondary | ICD-10-CM | POA: Diagnosis not present

## 2015-12-13 DIAGNOSIS — M4802 Spinal stenosis, cervical region: Secondary | ICD-10-CM | POA: Diagnosis not present

## 2015-12-13 DIAGNOSIS — G4733 Obstructive sleep apnea (adult) (pediatric): Secondary | ICD-10-CM | POA: Diagnosis not present

## 2015-12-13 DIAGNOSIS — N3941 Urge incontinence: Secondary | ICD-10-CM | POA: Diagnosis not present

## 2015-12-13 DIAGNOSIS — E785 Hyperlipidemia, unspecified: Secondary | ICD-10-CM | POA: Diagnosis not present

## 2015-12-13 DIAGNOSIS — I129 Hypertensive chronic kidney disease with stage 1 through stage 4 chronic kidney disease, or unspecified chronic kidney disease: Secondary | ICD-10-CM | POA: Diagnosis not present

## 2015-12-13 DIAGNOSIS — M19071 Primary osteoarthritis, right ankle and foot: Secondary | ICD-10-CM | POA: Diagnosis not present

## 2015-12-13 DIAGNOSIS — J449 Chronic obstructive pulmonary disease, unspecified: Secondary | ICD-10-CM | POA: Diagnosis not present

## 2015-12-13 LAB — MICROSCOPIC EXAMINATION
Bacteria, UA: NONE SEEN
EPITHELIAL CELLS (NON RENAL): NONE SEEN /HPF (ref 0–10)
RBC MICROSCOPIC, UA: NONE SEEN /HPF (ref 0–?)
WBC, UA: >30 /hpf — AB (ref 0–?)

## 2015-12-13 LAB — BLADDER SCAN AMB NON-IMAGING: Scan Result: 44

## 2015-12-13 LAB — URINALYSIS, COMPLETE
Bilirubin, UA: NEGATIVE
Glucose, UA: NEGATIVE
Ketones, UA: NEGATIVE
Nitrite, UA: NEGATIVE
PH UA: 5.5 (ref 5.0–7.5)
Specific Gravity, UA: 1.02 (ref 1.005–1.030)
Urobilinogen, Ur: 0.2 mg/dL (ref 0.2–1.0)

## 2015-12-13 MED ORDER — MIRABEGRON ER 25 MG PO TB24: 25.0000 mg | ORAL_TABLET | Freq: Every day | ORAL | Status: DC

## 2015-12-13 NOTE — Progress Notes (Signed)
12/13/2015 11:41 AM   Ma Rings. Jul 21, 1934 [id number]  Referring provider: Birdie Sons, MD [address] [place], [place] [address]  Chief Complaint  Patient presents with  . Urinary Frequency    referred by self referral old patient of Dr. Madelin Headings    HPI: Patient is a 81 year old Caucasian male who presents today as a self-referral for urinary incontinence.  Patient's states that approximately 2 months ago he started to experience urinary incontinence.  He states he would feel the urge to void and the urine would just come out.  He had his right foot operated on on 10/27/2015, but he states the symptoms were present prior to that surgery.  He makes 5-6 trips to the bathroom during the day, 5-6 trips to the bathroom during the night and has 8-10 episodes of urge incontinence daily.  He wears depends on a continual basis.  He is also experiencing dysuria, intermittency and a weak urinary stream.    His IPSS score was 14/6.  His UA today was positive for > 30 WBC's/hpf.  It had a foul odor and was yellow cloudy.  He is currently on doxazosin 2 mg daily and oxybutynin ER 5 mg daily by his PCP.        IPSS      12/13/15 1100       International Prostate Symptom Score   How often have you had the sensation of not emptying your bladder? Not at All     How often have you had to urinate less than every two hours? Less than 1 in 5 times     How often have you found you stopped and started again several times when you urinated? Not at All     How often have you found it difficult to postpone urination? Almost always     How often have you had a weak urinary stream? Almost always     How often have you had to strain to start urination? Not at All     How many times did you typically get up at night to urinate? 3 Times     Total IPSS Score 14     Quality of Life due to urinary symptoms   If you were to spend the rest of your life with your urinary condition just  the way it is now how would you feel about that? Terrible        Score:  1-7 Mild 8-19 Moderate 20-35 Severe   PMH: Past Medical History  Diagnosis Date  . PONV (postoperative nausea and vomiting)   . Hyperlipidemia     takes Simvastin daily  . Coronary artery disease   . COPD (chronic obstructive pulmonary disease) ([place])   . Emphysema   . Shortness of breath     with exertion  . Sleep apnea     uses CPAP  . History of bronchitis     last time a couple of yrs ago  . Pneumonia     hx of last time a couple of yrs ago  . Arthritis   . Joint pain   . Joint swelling   . Chronic back pain     stenosis  . Bruises easily   . H/O hiatal hernia   . GERD (gastroesophageal reflux disease)     takes Omeprazole daily  . History of gastric ulcer   . History of colon polyps   . H/O esophagogastroduodenoscopy   . Hypertension  takes Metoprolol daily and Cardura nightly  . Peripheral edema     takes Lasix daily  . Leg cramps     takes quinine prn  . Diabetes mellitus without complication ([place])     takes Glimepiride daily  . History of blood transfusion     no reaction noted  . Insomnia     takes Trazodone nightly  . History of MRSA infection   . CHF (congestive heart failure) ([place])   . Panic attacks   . Melanoma ([place])   . Anemia   . Liver fatty degeneration   . Anginal pain [facility])     Surgical History: Past Surgical History  Procedure Laterality Date  . Cholecystectomy  1970  . Replacement total knee bilateral  '86 and 2000  . Total hip arthroplasty Right 2012    [place]  . Neck surgery      x 2  . Coronary artery bypass graft  2000     3 vessels  . Colonoscopy    . Bilateral cataract surgery    . Reverse shoulder arthroplasty  05/30/2012    Procedure: REVERSE SHOULDER ARTHROPLASTY;  Surgeon: Augustin Schooling, MD;  Location: [facility];  Service: Orthopedics;  Laterality: Right;  right reverse shoulder arthroplasty  . Back surgery      x 4, last lumb. laminectomy  Dr. Annette Stable 2008 cervical fusion x 2  . Ankle fusion Right 11/04/2015    Procedure: ARTHRODESIS ANKLE;  Surgeon: Samara Deist, DPM;  Location: [facility];  Service: Podiatry;  Laterality: Right;  . Prostate laser surgery      x 2    Home Medications:    Medication List       This list is accurate as of: 12/13/15 11:41 AM.  Always use your most recent med list.               acetaminophen 325 MG tablet  Commonly known as:  TYLENOL  Take 650 mg by mouth every 6 (six) hours as needed.     aspirin 81 MG tablet  Take 81 mg by mouth daily.     doxazosin 2 MG tablet  Commonly known as:  CARDURA  TAKE ONE (1) TABLET BY MOUTH EVERY DAY     enoxaparin 40 MG/0.4ML injection  Commonly known as:  LOVENOX  Inject 0.4 mLs (40 mg total) into the skin daily.     fluticasone 50 MCG/ACT nasal spray  Commonly known as:  FLONASE  USE ONE TO TWO SPRAYS IN EACH NOSTRIL EVERY DAY     furosemide 40 MG tablet  Commonly known as:  LASIX  TAKE ONE (1) TABLET BY MOUTH EVERY DAY     gabapentin 300 MG capsule  Commonly known as:  NEURONTIN  Take 300 mg by mouth 2 (two) times daily. Reported on 09/13/2015     glimepiride 1 MG tablet  Commonly known as:  AMARYL  Take 1 mg by mouth daily before breakfast.     HYDROcodone-acetaminophen 5-325 MG tablet  Commonly known as:  NORCO/VICODIN  Take 1 tablet by mouth every 6 (six) hours as needed for moderate pain.     loratadine 10 MG tablet  Commonly known as:  CLARITIN  Take 10 mg by mouth daily.     meloxicam 15 MG tablet  Commonly known as:  MOBIC  TAKE ONE TABLET BY MOUTH DAILY AS NEEDED     metFORMIN 500 MG 24 hr tablet  Commonly known as:  GLUCOPHAGE-XR  Take 1  tablet (500 mg total) by mouth daily.     methocarbamol 500 MG tablet  Commonly known as:  ROBAXIN  Take 1 tablet (500 mg total) by mouth 3 (three) times daily as needed.     metoprolol succinate 25 MG 24 hr tablet  Commonly known as:  TOPROL-XL  Take by mouth. Reported on 12/13/2015       metoprolol tartrate 25 MG tablet  Commonly known as:  LOPRESSOR  Take 25 mg by mouth 2 (two) times daily. Reported on 09/13/2015     mirabegron ER 25 MG Tb24 tablet  Commonly known as:  MYRBETRIQ  Take 1 tablet (25 mg total) by mouth daily.     mirtazapine 7.5 MG tablet  Commonly known as:  REMERON  Reported on 12/13/2015     multivitamin with minerals Tabs tablet  Take 1 tablet by mouth daily.     nystatin powder  Commonly known as:  nystatin  Apply 1 g topically 2 (two) times daily.     omeprazole 20 MG capsule  Commonly known as:  PRILOSEC  TAKE ONE (1) CAPSULE BY MOUTH 2 TIMES DAILY     oxybutynin 5 MG 24 hr tablet  Commonly known as:  DITROPAN-XL     oxyCODONE 5 MG immediate release tablet  Commonly known as:  Oxy IR/ROXICODONE  Take 1-2 tablets (5-10 mg total) by mouth every 4 (four) hours as needed for moderate pain.     polyethylene glycol packet  Commonly known as:  MIRALAX / GLYCOLAX  Take 17 g by mouth daily as needed for moderate constipation.     potassium chloride SA 20 MEQ tablet  Commonly known as:  K-DUR,KLOR-CON  Take 20 mEq by mouth daily.     quiNINE 324 MG capsule  Commonly known as:  QUALAQUIN  Take 648 mg by mouth every 8 (eight) hours. For leg cramps     senna 8.6 MG Tabs tablet  Commonly known as:  SENOKOT  Take 1 tablet by mouth.     simvastatin 40 MG tablet  Commonly known as:  ZOCOR  TAKE ONE TABLET BY MOUTH EVERY NIGHT AT BEDTIME     traZODone 100 MG tablet  Commonly known as:  DESYREL  1/2 to 1 tablet at bedtime as needed for insomnia.     zinc oxide 11.3 % Crea cream  Commonly known as:  BALMEX  Apply 1 application topically 2 (two) times daily.        Allergies:  Allergies  Allergen Reactions  . Hydrocodone-Acetaminophen Shortness Of Breath  . Propoxyphene Anaphylaxis  . Codeine Sulfate     Patient denies  . Darvon [Propoxyphene Hcl] Other (See Comments)    Short of breath  . Demerol [Meperidine] Other (See  Comments)    Short of breath    Family History: Family History  Problem Relation Age of Onset  . Alzheimer's disease Mother   . Heart attack Father   . Bipolar disorder Brother   . Kidney disease Neg Hx   . Prostate cancer Neg Hx     Social History:  reports that he quit smoking about 47 years ago. His smoking use included Cigarettes. He has a 25 pack-year smoking history. He does not have any smokeless tobacco history on file. He reports that he does not drink alcohol or use illicit drugs.  ROS: UROLOGY Frequent Urination?: Yes Hard to postpone urination?: Yes Burning/pain with urination?: Yes Get up at night to urinate?: Yes Leakage of urine?: Yes Urine  stream starts and stops?: Yes Trouble starting stream?: No Do you have to strain to urinate?: No Blood in urine?: Yes Urinary tract infection?: No Sexually transmitted disease?: No Injury to kidneys or bladder?: No Painful intercourse?: No Weak stream?: Yes Erection problems?: Yes Penile pain?: Yes  Gastrointestinal Nausea?: Yes Vomiting?: No Indigestion/heartburn?: Yes Diarrhea?: Yes Constipation?: No  Constitutional Fever: No Night sweats?: No Weight loss?: Yes Fatigue?: Yes  Skin Skin rash/lesions?: Yes Itching?: No  Eyes Blurred vision?: No Double vision?: No  Ears/Nose/Throat Sore throat?: No Sinus problems?: No  Hematologic/Lymphatic Swollen glands?: No Easy bruising?: Yes  Cardiovascular Leg swelling?: No Chest pain?: No  Respiratory Cough?: Yes Shortness of breath?: Yes  Endocrine Excessive thirst?: Yes  Musculoskeletal Back pain?: Yes Joint pain?: Yes  Neurological Headaches?: No Dizziness?: No  Psychologic Depression?: No Anxiety?: Yes  Physical Exam: BP 99/65 mmHg  Pulse 74  Ht 5\' 1"  (1.549 m)  Wt 199 lb (90.266 kg)  BMI 37.62 kg/m2  Constitutional: Well nourished. Alert and oriented, No acute distress. HEENT: [place] AT, moist mucus membranes. Trachea midline, no  masses. Cardiovascular: No clubbing, cyanosis, or edema. Respiratory: Normal respiratory effort, no increased work of breathing. GI: Abdomen is soft, non tender, non distended, no abdominal masses. Obese. Liver and spleen not palpable.  No hernias appreciated.  Stool sample for occult testing is not indicated.   GU: No CVA tenderness.  No bladder fullness or masses.  Patient unable to stand for exam.   Rectal: Deferred.  Skin: No rashes, bruises or suspicious lesions. Lymph: No cervical or inguinal adenopathy. Neurologic: Grossly intact, no focal deficits, moving all 4 extremities. Psychiatric: Normal mood and affect.  Laboratory Data: Lab Results  Component Value Date   WBC 8.1 11/07/2015   HGB 10.4* 11/07/2015   HCT 30.4* 11/07/2015   MCV 91.7 11/07/2015   PLT 119* 11/07/2015    Lab Results  Component Value Date   CREATININE 1.08 11/07/2015      Lab Results  Component Value Date   HGBA1C 6.9* 11/04/2015        Component Value Date/Time   CHOL 144 10/10/2015 0916   HDL 54 10/10/2015 0916   CHOLHDL 2.7 10/10/2015 0916   LDLCALC 63 10/10/2015 0916    Lab Results  Component Value Date   AST 29 10/10/2015   Lab Results  Component Value Date   ALT 41 10/10/2015    Urinalysis Results for orders placed or performed in visit on 12/13/15  CULTURE, URINE COMPREHENSIVE  Result Value Ref Range   Urine Culture, Comprehensive Preliminary report (A)    Result 1 Escherichia coli (A)   Microscopic Examination  Result Value Ref Range   WBC, UA >30 (A) 0 -  5 /hpf   RBC, UA None seen 0 -  2 /hpf   Epithelial Cells (non renal) None seen 0 - 10 /hpf   Bacteria, UA None seen None seen/Few  Urinalysis, Complete  Result Value Ref Range   Specific Gravity, UA 1.020 1.005 - 1.030   pH, UA 5.5 5.0 - 7.5   Color, UA Yellow Yellow   Appearance Ur Turbid (A) Clear   Leukocytes, UA 3+ (A) Negative   Protein, UA 1+ (A) Negative/Trace   Glucose, UA Negative Negative   Ketones,  UA Negative Negative   RBC, UA 2+ (A) Negative   Bilirubin, UA Negative Negative   Urobilinogen, Ur 0.2 0.2 - 1.0 mg/dL   Nitrite, UA Negative Negative   Microscopic Examination See below:  BLADDER SCAN AMB NON-IMAGING  Result Value Ref Range   Scan Result 44    Pertinent Imaging: Results for R[place], AZPEITIA (MRN [id number]) as of 12/15/2015 14:32  Ref. Range 12/13/2015 11:25  Scan Result Unknown 44    Assessment & Plan:    1. Urge incontinence:   Patient is on one oxybutynin 5 mg XR, one tablet daily.  I have asked him to discontinue the medication.  I have given him Myrbetriq 25 mg daily, # 35 samples.  I have advised the patient of the side effects of Myrbetriq, such as: elevation in BP, urinary retention and/or HA.  He will RTC in one month for PVR, IPSS and exam.  - Urinalysis, Complete - BLADDER SCAN AMB NON-IMAGING  2. Frequency:  See above.  3. Nocturia:  I explained to the patient that nocturia is often multi-factorial and difficult to treat.  Sleeping disorders, heart conditions and peripheral vascular disease, diabetes,  enlarged prostate or urethral stricture causing bladder outlet obstruction and/or certain medications.  I have suggested that the patient avoid caffeine and alcohol in the evening. He may also benefit from fluid restrictions after 6:00 in the evening and voiding just prior to bedtime.  Patient has sleep apnea and sleeps with a CPAP machine.     Return in about 1 month (around 01/12/2016) for PVR, IPSS and exam.  These notes generated with voice recognition software. I apologize for typographical errors.  Zara Council, [facility] [address] [place],  [address] [phone]

## 2015-12-13 NOTE — Telephone Encounter (Signed)
Pt daughter, Leveda Anna called stating pt is at [facility] and the  physical therapist from [place] has advised pt should be in a hospital bed.  Pt is having a hard time getting out of bed and he can not use his cpap machine correctly due to laying flat.  This order needs to be sent to [facility].  [id number]

## 2015-12-15 DIAGNOSIS — E1122 Type 2 diabetes mellitus with diabetic chronic kidney disease: Secondary | ICD-10-CM | POA: Diagnosis not present

## 2015-12-15 DIAGNOSIS — Z981 Arthrodesis status: Secondary | ICD-10-CM | POA: Diagnosis not present

## 2015-12-15 DIAGNOSIS — J449 Chronic obstructive pulmonary disease, unspecified: Secondary | ICD-10-CM | POA: Diagnosis not present

## 2015-12-15 DIAGNOSIS — M4802 Spinal stenosis, cervical region: Secondary | ICD-10-CM | POA: Diagnosis not present

## 2015-12-15 DIAGNOSIS — D649 Anemia, unspecified: Secondary | ICD-10-CM | POA: Diagnosis not present

## 2015-12-15 DIAGNOSIS — M19071 Primary osteoarthritis, right ankle and foot: Secondary | ICD-10-CM | POA: Diagnosis not present

## 2015-12-15 DIAGNOSIS — I129 Hypertensive chronic kidney disease with stage 1 through stage 4 chronic kidney disease, or unspecified chronic kidney disease: Secondary | ICD-10-CM | POA: Diagnosis not present

## 2015-12-15 DIAGNOSIS — R351 Nocturia: Secondary | ICD-10-CM | POA: Insufficient documentation

## 2015-12-15 DIAGNOSIS — R350 Frequency of micturition: Secondary | ICD-10-CM | POA: Insufficient documentation

## 2015-12-15 DIAGNOSIS — N184 Chronic kidney disease, stage 4 (severe): Secondary | ICD-10-CM | POA: Diagnosis not present

## 2015-12-15 DIAGNOSIS — I25119 Atherosclerotic heart disease of native coronary artery with unspecified angina pectoris: Secondary | ICD-10-CM | POA: Diagnosis not present

## 2015-12-15 DIAGNOSIS — E785 Hyperlipidemia, unspecified: Secondary | ICD-10-CM | POA: Diagnosis not present

## 2015-12-15 DIAGNOSIS — G4733 Obstructive sleep apnea (adult) (pediatric): Secondary | ICD-10-CM | POA: Diagnosis not present

## 2015-12-15 DIAGNOSIS — N3941 Urge incontinence: Secondary | ICD-10-CM | POA: Insufficient documentation

## 2015-12-15 DIAGNOSIS — K219 Gastro-esophageal reflux disease without esophagitis: Secondary | ICD-10-CM | POA: Diagnosis not present

## 2015-12-15 LAB — CULTURE, URINE COMPREHENSIVE

## 2015-12-15 NOTE — Telephone Encounter (Signed)
Pt's daughter called again requesting for him to have a hospital bed.  Her call back is [phone]  Thanks Con Memos

## 2015-12-15 NOTE — Telephone Encounter (Signed)
Please advise 

## 2015-12-16 ENCOUNTER — Telehealth: Payer: Self-pay

## 2015-12-16 DIAGNOSIS — D649 Anemia, unspecified: Secondary | ICD-10-CM | POA: Diagnosis not present

## 2015-12-16 DIAGNOSIS — G4733 Obstructive sleep apnea (adult) (pediatric): Secondary | ICD-10-CM | POA: Diagnosis not present

## 2015-12-16 DIAGNOSIS — J449 Chronic obstructive pulmonary disease, unspecified: Secondary | ICD-10-CM | POA: Diagnosis not present

## 2015-12-16 DIAGNOSIS — I25119 Atherosclerotic heart disease of native coronary artery with unspecified angina pectoris: Secondary | ICD-10-CM | POA: Diagnosis not present

## 2015-12-16 DIAGNOSIS — E1122 Type 2 diabetes mellitus with diabetic chronic kidney disease: Secondary | ICD-10-CM | POA: Diagnosis not present

## 2015-12-16 DIAGNOSIS — E785 Hyperlipidemia, unspecified: Secondary | ICD-10-CM | POA: Diagnosis not present

## 2015-12-16 DIAGNOSIS — I129 Hypertensive chronic kidney disease with stage 1 through stage 4 chronic kidney disease, or unspecified chronic kidney disease: Secondary | ICD-10-CM | POA: Diagnosis not present

## 2015-12-16 DIAGNOSIS — N390 Urinary tract infection, site not specified: Secondary | ICD-10-CM

## 2015-12-16 DIAGNOSIS — Z981 Arthrodesis status: Secondary | ICD-10-CM | POA: Diagnosis not present

## 2015-12-16 DIAGNOSIS — M19071 Primary osteoarthritis, right ankle and foot: Secondary | ICD-10-CM | POA: Diagnosis not present

## 2015-12-16 DIAGNOSIS — M4802 Spinal stenosis, cervical region: Secondary | ICD-10-CM | POA: Diagnosis not present

## 2015-12-16 DIAGNOSIS — N184 Chronic kidney disease, stage 4 (severe): Secondary | ICD-10-CM | POA: Diagnosis not present

## 2015-12-16 DIAGNOSIS — K219 Gastro-esophageal reflux disease without esophagitis: Secondary | ICD-10-CM | POA: Diagnosis not present

## 2015-12-16 MED ORDER — AMOXICILLIN-POT CLAVULANATE 875-125 MG PO TABS: 1.0000 | ORAL_TABLET | Freq: Two times a day (BID) | ORAL | Status: AC

## 2015-12-16 NOTE — Telephone Encounter (Signed)
-----   Message from Nori Riis, PA-C sent at 12/15/2015  4:46 PM EDT ----- Patient has an UTI.  He needs to start Augmentin 875/125 mg, one tablet twice daily, # 14.

## 2015-12-16 NOTE — Telephone Encounter (Signed)
Spoke with pt in reference to +ucx. Made aware abx were sent to pharmacy. Pt voiced understanding.  

## 2015-12-16 NOTE — Telephone Encounter (Signed)
Order has already been sent to [facility].

## 2015-12-20 DIAGNOSIS — I25119 Atherosclerotic heart disease of native coronary artery with unspecified angina pectoris: Secondary | ICD-10-CM | POA: Diagnosis not present

## 2015-12-20 DIAGNOSIS — E785 Hyperlipidemia, unspecified: Secondary | ICD-10-CM | POA: Diagnosis not present

## 2015-12-20 DIAGNOSIS — G4733 Obstructive sleep apnea (adult) (pediatric): Secondary | ICD-10-CM | POA: Diagnosis not present

## 2015-12-20 DIAGNOSIS — M4802 Spinal stenosis, cervical region: Secondary | ICD-10-CM | POA: Diagnosis not present

## 2015-12-20 DIAGNOSIS — M19071 Primary osteoarthritis, right ankle and foot: Secondary | ICD-10-CM | POA: Diagnosis not present

## 2015-12-20 DIAGNOSIS — D649 Anemia, unspecified: Secondary | ICD-10-CM | POA: Diagnosis not present

## 2015-12-20 DIAGNOSIS — E1122 Type 2 diabetes mellitus with diabetic chronic kidney disease: Secondary | ICD-10-CM | POA: Diagnosis not present

## 2015-12-20 DIAGNOSIS — K219 Gastro-esophageal reflux disease without esophagitis: Secondary | ICD-10-CM | POA: Diagnosis not present

## 2015-12-20 DIAGNOSIS — Z981 Arthrodesis status: Secondary | ICD-10-CM | POA: Diagnosis not present

## 2015-12-20 DIAGNOSIS — J449 Chronic obstructive pulmonary disease, unspecified: Secondary | ICD-10-CM | POA: Diagnosis not present

## 2015-12-20 DIAGNOSIS — N184 Chronic kidney disease, stage 4 (severe): Secondary | ICD-10-CM | POA: Diagnosis not present

## 2015-12-20 DIAGNOSIS — I129 Hypertensive chronic kidney disease with stage 1 through stage 4 chronic kidney disease, or unspecified chronic kidney disease: Secondary | ICD-10-CM | POA: Diagnosis not present

## 2015-12-21 ENCOUNTER — Telehealth: Payer: Self-pay | Admitting: Family Medicine

## 2015-12-21 DIAGNOSIS — E785 Hyperlipidemia, unspecified: Secondary | ICD-10-CM | POA: Diagnosis not present

## 2015-12-21 DIAGNOSIS — N184 Chronic kidney disease, stage 4 (severe): Secondary | ICD-10-CM | POA: Diagnosis not present

## 2015-12-21 DIAGNOSIS — E1122 Type 2 diabetes mellitus with diabetic chronic kidney disease: Secondary | ICD-10-CM | POA: Diagnosis not present

## 2015-12-21 DIAGNOSIS — I129 Hypertensive chronic kidney disease with stage 1 through stage 4 chronic kidney disease, or unspecified chronic kidney disease: Secondary | ICD-10-CM | POA: Diagnosis not present

## 2015-12-21 DIAGNOSIS — G4733 Obstructive sleep apnea (adult) (pediatric): Secondary | ICD-10-CM | POA: Diagnosis not present

## 2015-12-21 DIAGNOSIS — G4739 Other sleep apnea: Secondary | ICD-10-CM | POA: Diagnosis not present

## 2015-12-21 DIAGNOSIS — K219 Gastro-esophageal reflux disease without esophagitis: Secondary | ICD-10-CM | POA: Diagnosis not present

## 2015-12-21 DIAGNOSIS — Z981 Arthrodesis status: Secondary | ICD-10-CM | POA: Diagnosis not present

## 2015-12-21 DIAGNOSIS — D649 Anemia, unspecified: Secondary | ICD-10-CM | POA: Diagnosis not present

## 2015-12-21 DIAGNOSIS — I25119 Atherosclerotic heart disease of native coronary artery with unspecified angina pectoris: Secondary | ICD-10-CM | POA: Diagnosis not present

## 2015-12-21 DIAGNOSIS — M19071 Primary osteoarthritis, right ankle and foot: Secondary | ICD-10-CM | POA: Diagnosis not present

## 2015-12-21 DIAGNOSIS — J449 Chronic obstructive pulmonary disease, unspecified: Secondary | ICD-10-CM | POA: Diagnosis not present

## 2015-12-21 DIAGNOSIS — M4802 Spinal stenosis, cervical region: Secondary | ICD-10-CM | POA: Diagnosis not present

## 2015-12-21 NOTE — Telephone Encounter (Signed)
Order has been printed, signed and sent to medical records to be faxed.

## 2015-12-21 NOTE — Telephone Encounter (Signed)
Pt's daughter called and said [facility] states the insurance company denied the hospital bed order and they were told the order would need to have a reason as to why they patient needs the bed.  Pt's daughter is requesting a new order be sent stating he needs the bed in order to use his CPAP machine.

## 2015-12-23 DIAGNOSIS — E785 Hyperlipidemia, unspecified: Secondary | ICD-10-CM | POA: Diagnosis not present

## 2015-12-23 DIAGNOSIS — M19071 Primary osteoarthritis, right ankle and foot: Secondary | ICD-10-CM | POA: Diagnosis not present

## 2015-12-23 DIAGNOSIS — E1122 Type 2 diabetes mellitus with diabetic chronic kidney disease: Secondary | ICD-10-CM | POA: Diagnosis not present

## 2015-12-23 DIAGNOSIS — D649 Anemia, unspecified: Secondary | ICD-10-CM | POA: Diagnosis not present

## 2015-12-23 DIAGNOSIS — K219 Gastro-esophageal reflux disease without esophagitis: Secondary | ICD-10-CM | POA: Diagnosis not present

## 2015-12-23 DIAGNOSIS — G4733 Obstructive sleep apnea (adult) (pediatric): Secondary | ICD-10-CM | POA: Diagnosis not present

## 2015-12-23 DIAGNOSIS — I25119 Atherosclerotic heart disease of native coronary artery with unspecified angina pectoris: Secondary | ICD-10-CM | POA: Diagnosis not present

## 2015-12-23 DIAGNOSIS — J449 Chronic obstructive pulmonary disease, unspecified: Secondary | ICD-10-CM | POA: Diagnosis not present

## 2015-12-23 DIAGNOSIS — Z981 Arthrodesis status: Secondary | ICD-10-CM | POA: Diagnosis not present

## 2015-12-23 DIAGNOSIS — I129 Hypertensive chronic kidney disease with stage 1 through stage 4 chronic kidney disease, or unspecified chronic kidney disease: Secondary | ICD-10-CM | POA: Diagnosis not present

## 2015-12-23 DIAGNOSIS — N184 Chronic kidney disease, stage 4 (severe): Secondary | ICD-10-CM | POA: Diagnosis not present

## 2015-12-23 DIAGNOSIS — M4802 Spinal stenosis, cervical region: Secondary | ICD-10-CM | POA: Diagnosis not present

## 2015-12-26 DIAGNOSIS — E785 Hyperlipidemia, unspecified: Secondary | ICD-10-CM | POA: Diagnosis not present

## 2015-12-26 DIAGNOSIS — D649 Anemia, unspecified: Secondary | ICD-10-CM | POA: Diagnosis not present

## 2015-12-26 DIAGNOSIS — K219 Gastro-esophageal reflux disease without esophagitis: Secondary | ICD-10-CM | POA: Diagnosis not present

## 2015-12-26 DIAGNOSIS — G4733 Obstructive sleep apnea (adult) (pediatric): Secondary | ICD-10-CM | POA: Diagnosis not present

## 2015-12-26 DIAGNOSIS — E1122 Type 2 diabetes mellitus with diabetic chronic kidney disease: Secondary | ICD-10-CM | POA: Diagnosis not present

## 2015-12-26 DIAGNOSIS — M4802 Spinal stenosis, cervical region: Secondary | ICD-10-CM | POA: Diagnosis not present

## 2015-12-26 DIAGNOSIS — M19071 Primary osteoarthritis, right ankle and foot: Secondary | ICD-10-CM | POA: Diagnosis not present

## 2015-12-26 DIAGNOSIS — I25119 Atherosclerotic heart disease of native coronary artery with unspecified angina pectoris: Secondary | ICD-10-CM | POA: Diagnosis not present

## 2015-12-26 DIAGNOSIS — J449 Chronic obstructive pulmonary disease, unspecified: Secondary | ICD-10-CM | POA: Diagnosis not present

## 2015-12-26 DIAGNOSIS — Z981 Arthrodesis status: Secondary | ICD-10-CM | POA: Diagnosis not present

## 2015-12-26 DIAGNOSIS — N184 Chronic kidney disease, stage 4 (severe): Secondary | ICD-10-CM | POA: Diagnosis not present

## 2015-12-26 DIAGNOSIS — I129 Hypertensive chronic kidney disease with stage 1 through stage 4 chronic kidney disease, or unspecified chronic kidney disease: Secondary | ICD-10-CM | POA: Diagnosis not present

## 2015-12-27 DIAGNOSIS — M25571 Pain in right ankle and joints of right foot: Secondary | ICD-10-CM | POA: Diagnosis not present

## 2015-12-27 DIAGNOSIS — M19071 Primary osteoarthritis, right ankle and foot: Secondary | ICD-10-CM | POA: Diagnosis not present

## 2015-12-28 DIAGNOSIS — Z981 Arthrodesis status: Secondary | ICD-10-CM | POA: Diagnosis not present

## 2015-12-28 DIAGNOSIS — N184 Chronic kidney disease, stage 4 (severe): Secondary | ICD-10-CM | POA: Diagnosis not present

## 2015-12-28 DIAGNOSIS — M4802 Spinal stenosis, cervical region: Secondary | ICD-10-CM | POA: Diagnosis not present

## 2015-12-28 DIAGNOSIS — E1122 Type 2 diabetes mellitus with diabetic chronic kidney disease: Secondary | ICD-10-CM | POA: Diagnosis not present

## 2015-12-28 DIAGNOSIS — K219 Gastro-esophageal reflux disease without esophagitis: Secondary | ICD-10-CM | POA: Diagnosis not present

## 2015-12-28 DIAGNOSIS — G4733 Obstructive sleep apnea (adult) (pediatric): Secondary | ICD-10-CM | POA: Diagnosis not present

## 2015-12-28 DIAGNOSIS — I25119 Atherosclerotic heart disease of native coronary artery with unspecified angina pectoris: Secondary | ICD-10-CM | POA: Diagnosis not present

## 2015-12-28 DIAGNOSIS — M19071 Primary osteoarthritis, right ankle and foot: Secondary | ICD-10-CM | POA: Diagnosis not present

## 2015-12-28 DIAGNOSIS — I129 Hypertensive chronic kidney disease with stage 1 through stage 4 chronic kidney disease, or unspecified chronic kidney disease: Secondary | ICD-10-CM | POA: Diagnosis not present

## 2015-12-28 DIAGNOSIS — E785 Hyperlipidemia, unspecified: Secondary | ICD-10-CM | POA: Diagnosis not present

## 2015-12-28 DIAGNOSIS — D649 Anemia, unspecified: Secondary | ICD-10-CM | POA: Diagnosis not present

## 2015-12-28 DIAGNOSIS — J449 Chronic obstructive pulmonary disease, unspecified: Secondary | ICD-10-CM | POA: Diagnosis not present

## 2015-12-29 DIAGNOSIS — J449 Chronic obstructive pulmonary disease, unspecified: Secondary | ICD-10-CM | POA: Diagnosis not present

## 2015-12-29 DIAGNOSIS — N184 Chronic kidney disease, stage 4 (severe): Secondary | ICD-10-CM | POA: Diagnosis not present

## 2015-12-29 DIAGNOSIS — Z981 Arthrodesis status: Secondary | ICD-10-CM | POA: Diagnosis not present

## 2015-12-29 DIAGNOSIS — I25119 Atherosclerotic heart disease of native coronary artery with unspecified angina pectoris: Secondary | ICD-10-CM | POA: Diagnosis not present

## 2015-12-29 DIAGNOSIS — D649 Anemia, unspecified: Secondary | ICD-10-CM | POA: Diagnosis not present

## 2015-12-29 DIAGNOSIS — G4733 Obstructive sleep apnea (adult) (pediatric): Secondary | ICD-10-CM | POA: Diagnosis not present

## 2015-12-29 DIAGNOSIS — M19071 Primary osteoarthritis, right ankle and foot: Secondary | ICD-10-CM | POA: Diagnosis not present

## 2015-12-29 DIAGNOSIS — E785 Hyperlipidemia, unspecified: Secondary | ICD-10-CM | POA: Diagnosis not present

## 2015-12-29 DIAGNOSIS — M4802 Spinal stenosis, cervical region: Secondary | ICD-10-CM | POA: Diagnosis not present

## 2015-12-29 DIAGNOSIS — E1122 Type 2 diabetes mellitus with diabetic chronic kidney disease: Secondary | ICD-10-CM | POA: Diagnosis not present

## 2015-12-29 DIAGNOSIS — I129 Hypertensive chronic kidney disease with stage 1 through stage 4 chronic kidney disease, or unspecified chronic kidney disease: Secondary | ICD-10-CM | POA: Diagnosis not present

## 2015-12-29 DIAGNOSIS — K219 Gastro-esophageal reflux disease without esophagitis: Secondary | ICD-10-CM | POA: Diagnosis not present

## 2016-01-02 ENCOUNTER — Telehealth: Payer: Self-pay | Admitting: Urology

## 2016-01-02 DIAGNOSIS — D649 Anemia, unspecified: Secondary | ICD-10-CM | POA: Diagnosis not present

## 2016-01-02 DIAGNOSIS — J449 Chronic obstructive pulmonary disease, unspecified: Secondary | ICD-10-CM | POA: Diagnosis not present

## 2016-01-02 DIAGNOSIS — E1122 Type 2 diabetes mellitus with diabetic chronic kidney disease: Secondary | ICD-10-CM | POA: Diagnosis not present

## 2016-01-02 DIAGNOSIS — N184 Chronic kidney disease, stage 4 (severe): Secondary | ICD-10-CM | POA: Diagnosis not present

## 2016-01-02 DIAGNOSIS — Z981 Arthrodesis status: Secondary | ICD-10-CM | POA: Diagnosis not present

## 2016-01-02 DIAGNOSIS — I25119 Atherosclerotic heart disease of native coronary artery with unspecified angina pectoris: Secondary | ICD-10-CM | POA: Diagnosis not present

## 2016-01-02 DIAGNOSIS — K219 Gastro-esophageal reflux disease without esophagitis: Secondary | ICD-10-CM | POA: Diagnosis not present

## 2016-01-02 DIAGNOSIS — E785 Hyperlipidemia, unspecified: Secondary | ICD-10-CM | POA: Diagnosis not present

## 2016-01-02 DIAGNOSIS — M19071 Primary osteoarthritis, right ankle and foot: Secondary | ICD-10-CM | POA: Diagnosis not present

## 2016-01-02 DIAGNOSIS — G4733 Obstructive sleep apnea (adult) (pediatric): Secondary | ICD-10-CM | POA: Diagnosis not present

## 2016-01-02 DIAGNOSIS — M4802 Spinal stenosis, cervical region: Secondary | ICD-10-CM | POA: Diagnosis not present

## 2016-01-02 DIAGNOSIS — I129 Hypertensive chronic kidney disease with stage 1 through stage 4 chronic kidney disease, or unspecified chronic kidney disease: Secondary | ICD-10-CM | POA: Diagnosis not present

## 2016-01-02 NOTE — Telephone Encounter (Signed)
LMOM

## 2016-01-02 NOTE — Telephone Encounter (Signed)
Pt's daughter, Dorethea Clan called and stated patient is finished with antibiotics, but still has UTI symptoms.  Can you call in more Augmentin 875/125 mg, one tablet twice daily, # 14.  He is currently at [facility] and would like it sent to the [facility].  [phone]

## 2016-01-03 DIAGNOSIS — M4802 Spinal stenosis, cervical region: Secondary | ICD-10-CM | POA: Diagnosis not present

## 2016-01-03 DIAGNOSIS — Z981 Arthrodesis status: Secondary | ICD-10-CM | POA: Diagnosis not present

## 2016-01-03 DIAGNOSIS — M19071 Primary osteoarthritis, right ankle and foot: Secondary | ICD-10-CM | POA: Diagnosis not present

## 2016-01-03 DIAGNOSIS — E785 Hyperlipidemia, unspecified: Secondary | ICD-10-CM | POA: Diagnosis not present

## 2016-01-03 DIAGNOSIS — I129 Hypertensive chronic kidney disease with stage 1 through stage 4 chronic kidney disease, or unspecified chronic kidney disease: Secondary | ICD-10-CM | POA: Diagnosis not present

## 2016-01-03 DIAGNOSIS — G4733 Obstructive sleep apnea (adult) (pediatric): Secondary | ICD-10-CM | POA: Diagnosis not present

## 2016-01-03 DIAGNOSIS — D649 Anemia, unspecified: Secondary | ICD-10-CM | POA: Diagnosis not present

## 2016-01-03 DIAGNOSIS — J449 Chronic obstructive pulmonary disease, unspecified: Secondary | ICD-10-CM | POA: Diagnosis not present

## 2016-01-03 DIAGNOSIS — E1122 Type 2 diabetes mellitus with diabetic chronic kidney disease: Secondary | ICD-10-CM | POA: Diagnosis not present

## 2016-01-03 DIAGNOSIS — I25119 Atherosclerotic heart disease of native coronary artery with unspecified angina pectoris: Secondary | ICD-10-CM | POA: Diagnosis not present

## 2016-01-03 DIAGNOSIS — N184 Chronic kidney disease, stage 4 (severe): Secondary | ICD-10-CM | POA: Diagnosis not present

## 2016-01-03 DIAGNOSIS — K219 Gastro-esophageal reflux disease without esophagitis: Secondary | ICD-10-CM | POA: Diagnosis not present

## 2016-01-03 NOTE — Telephone Encounter (Signed)
LMOM I have not been able to get in touch with pt or daughter in reference to what symptoms he is having.

## 2016-01-05 DIAGNOSIS — M4802 Spinal stenosis, cervical region: Secondary | ICD-10-CM | POA: Diagnosis not present

## 2016-01-05 DIAGNOSIS — G4733 Obstructive sleep apnea (adult) (pediatric): Secondary | ICD-10-CM | POA: Diagnosis not present

## 2016-01-05 DIAGNOSIS — J449 Chronic obstructive pulmonary disease, unspecified: Secondary | ICD-10-CM | POA: Diagnosis not present

## 2016-01-05 DIAGNOSIS — I129 Hypertensive chronic kidney disease with stage 1 through stage 4 chronic kidney disease, or unspecified chronic kidney disease: Secondary | ICD-10-CM | POA: Diagnosis not present

## 2016-01-05 DIAGNOSIS — E785 Hyperlipidemia, unspecified: Secondary | ICD-10-CM | POA: Diagnosis not present

## 2016-01-05 DIAGNOSIS — E1122 Type 2 diabetes mellitus with diabetic chronic kidney disease: Secondary | ICD-10-CM | POA: Diagnosis not present

## 2016-01-05 DIAGNOSIS — N184 Chronic kidney disease, stage 4 (severe): Secondary | ICD-10-CM | POA: Diagnosis not present

## 2016-01-05 DIAGNOSIS — Z981 Arthrodesis status: Secondary | ICD-10-CM | POA: Diagnosis not present

## 2016-01-05 DIAGNOSIS — I25119 Atherosclerotic heart disease of native coronary artery with unspecified angina pectoris: Secondary | ICD-10-CM | POA: Diagnosis not present

## 2016-01-05 DIAGNOSIS — D649 Anemia, unspecified: Secondary | ICD-10-CM | POA: Diagnosis not present

## 2016-01-05 DIAGNOSIS — M19071 Primary osteoarthritis, right ankle and foot: Secondary | ICD-10-CM | POA: Diagnosis not present

## 2016-01-05 DIAGNOSIS — K219 Gastro-esophageal reflux disease without esophagitis: Secondary | ICD-10-CM | POA: Diagnosis not present

## 2016-01-06 DIAGNOSIS — K219 Gastro-esophageal reflux disease without esophagitis: Secondary | ICD-10-CM | POA: Diagnosis not present

## 2016-01-06 DIAGNOSIS — G4733 Obstructive sleep apnea (adult) (pediatric): Secondary | ICD-10-CM | POA: Diagnosis not present

## 2016-01-06 DIAGNOSIS — M4802 Spinal stenosis, cervical region: Secondary | ICD-10-CM | POA: Diagnosis not present

## 2016-01-06 DIAGNOSIS — N184 Chronic kidney disease, stage 4 (severe): Secondary | ICD-10-CM | POA: Diagnosis not present

## 2016-01-06 DIAGNOSIS — J449 Chronic obstructive pulmonary disease, unspecified: Secondary | ICD-10-CM | POA: Diagnosis not present

## 2016-01-06 DIAGNOSIS — Z981 Arthrodesis status: Secondary | ICD-10-CM | POA: Diagnosis not present

## 2016-01-06 DIAGNOSIS — D649 Anemia, unspecified: Secondary | ICD-10-CM | POA: Diagnosis not present

## 2016-01-06 DIAGNOSIS — M19071 Primary osteoarthritis, right ankle and foot: Secondary | ICD-10-CM | POA: Diagnosis not present

## 2016-01-06 DIAGNOSIS — I129 Hypertensive chronic kidney disease with stage 1 through stage 4 chronic kidney disease, or unspecified chronic kidney disease: Secondary | ICD-10-CM | POA: Diagnosis not present

## 2016-01-06 DIAGNOSIS — E1122 Type 2 diabetes mellitus with diabetic chronic kidney disease: Secondary | ICD-10-CM | POA: Diagnosis not present

## 2016-01-06 DIAGNOSIS — E785 Hyperlipidemia, unspecified: Secondary | ICD-10-CM | POA: Diagnosis not present

## 2016-01-06 DIAGNOSIS — I25119 Atherosclerotic heart disease of native coronary artery with unspecified angina pectoris: Secondary | ICD-10-CM | POA: Diagnosis not present

## 2016-01-08 DIAGNOSIS — M1990 Unspecified osteoarthritis, unspecified site: Secondary | ICD-10-CM | POA: Diagnosis not present

## 2016-01-08 NOTE — Telephone Encounter (Signed)
Would you try and reach out to the patient or his daughter again and see if he is still having symptoms and what those symptoms are?

## 2016-01-09 ENCOUNTER — Telehealth: Payer: Self-pay | Admitting: Family Medicine

## 2016-01-09 NOTE — Telephone Encounter (Signed)
LMOM

## 2016-01-09 NOTE — Telephone Encounter (Signed)
That's fine

## 2016-01-09 NOTE — Telephone Encounter (Signed)
Ester with [facility] would verbal order for occupational therapy for once a week for the first week and twice a week for the following 2 weeks. Please advise. Thanks TNP

## 2016-01-11 DIAGNOSIS — I25119 Atherosclerotic heart disease of native coronary artery with unspecified angina pectoris: Secondary | ICD-10-CM | POA: Diagnosis not present

## 2016-01-11 DIAGNOSIS — K219 Gastro-esophageal reflux disease without esophagitis: Secondary | ICD-10-CM | POA: Diagnosis not present

## 2016-01-11 DIAGNOSIS — Z981 Arthrodesis status: Secondary | ICD-10-CM | POA: Diagnosis not present

## 2016-01-11 DIAGNOSIS — E785 Hyperlipidemia, unspecified: Secondary | ICD-10-CM | POA: Diagnosis not present

## 2016-01-11 DIAGNOSIS — M19071 Primary osteoarthritis, right ankle and foot: Secondary | ICD-10-CM | POA: Diagnosis not present

## 2016-01-11 DIAGNOSIS — I129 Hypertensive chronic kidney disease with stage 1 through stage 4 chronic kidney disease, or unspecified chronic kidney disease: Secondary | ICD-10-CM | POA: Diagnosis not present

## 2016-01-11 DIAGNOSIS — N184 Chronic kidney disease, stage 4 (severe): Secondary | ICD-10-CM | POA: Diagnosis not present

## 2016-01-11 DIAGNOSIS — D649 Anemia, unspecified: Secondary | ICD-10-CM | POA: Diagnosis not present

## 2016-01-11 DIAGNOSIS — G4733 Obstructive sleep apnea (adult) (pediatric): Secondary | ICD-10-CM | POA: Diagnosis not present

## 2016-01-11 DIAGNOSIS — E1122 Type 2 diabetes mellitus with diabetic chronic kidney disease: Secondary | ICD-10-CM | POA: Diagnosis not present

## 2016-01-11 DIAGNOSIS — M4802 Spinal stenosis, cervical region: Secondary | ICD-10-CM | POA: Diagnosis not present

## 2016-01-11 DIAGNOSIS — J449 Chronic obstructive pulmonary disease, unspecified: Secondary | ICD-10-CM | POA: Diagnosis not present

## 2016-01-11 NOTE — Telephone Encounter (Signed)
Ester was notified.

## 2016-01-11 NOTE — Telephone Encounter (Signed)
Home health called to f/u on the order, relayed.

## 2016-01-12 DIAGNOSIS — M25571 Pain in right ankle and joints of right foot: Secondary | ICD-10-CM | POA: Diagnosis not present

## 2016-01-12 DIAGNOSIS — M19071 Primary osteoarthritis, right ankle and foot: Secondary | ICD-10-CM | POA: Diagnosis not present

## 2016-01-13 ENCOUNTER — Ambulatory Visit (INDEPENDENT_AMBULATORY_CARE_PROVIDER_SITE_OTHER): Payer: Medicare Other | Admitting: Urology

## 2016-01-13 ENCOUNTER — Encounter: Payer: Self-pay | Admitting: Urology

## 2016-01-13 VITALS — BP 115/73 | HR 103 | Ht 61.0 in | Wt 200.0 lb

## 2016-01-13 DIAGNOSIS — R350 Frequency of micturition: Secondary | ICD-10-CM

## 2016-01-13 DIAGNOSIS — R351 Nocturia: Secondary | ICD-10-CM | POA: Diagnosis not present

## 2016-01-13 DIAGNOSIS — R300 Dysuria: Secondary | ICD-10-CM | POA: Diagnosis not present

## 2016-01-13 LAB — URINALYSIS, COMPLETE
BILIRUBIN UA: NEGATIVE
Glucose, UA: NEGATIVE
Ketones, UA: NEGATIVE
NITRITE UA: POSITIVE — AB
PH UA: 5.5 (ref 5.0–7.5)
SPEC GRAV UA: 1.01 (ref 1.005–1.030)
UUROB: 0.2 mg/dL (ref 0.2–1.0)

## 2016-01-13 LAB — MICROSCOPIC EXAMINATION: EPITHELIAL CELLS (NON RENAL): NONE SEEN /HPF (ref 0–10)

## 2016-01-13 LAB — BLADDER SCAN AMB NON-IMAGING: SCAN RESULT: 74

## 2016-01-13 MED ORDER — SULFAMETHOXAZOLE-TRIMETHOPRIM 800-160 MG PO TABS: 1.0000 | ORAL_TABLET | Freq: Two times a day (BID) | ORAL | Status: DC

## 2016-01-13 NOTE — Progress Notes (Signed)
10:52 AM   Ma Rings. 01/24/1935 ET:[id number]  Referring provider: Birdie Sons, MD [address] [place], [place] [address]  Chief Complaint  Patient presents with  . Urinary Frequency    1 month follow up     HPI: Patient is a 81 year old Caucasian male who presents after a trial of Myrbetriq 25 mg and a recheck of an UTI.  Background history Patient was a self-referral for urinary incontinence.  Patient's states that approximately 2 months ago he started to experience urinary incontinence.  He states he would feel the urge to void and the urine would just come out.  He had his right foot operated on 10/27/2015, but he states the symptoms were present prior to that surgery.  He makes 5-6 trips to the bathroom during the day, 5-6 trips to the bathroom during the night and has 8-10 episodes of urge incontinence daily.  He wears depends on a continual basis.  He is also experiencing dysuria, intermittency and a weak urinary stream.  His IPSS score was 14/6.  His UA today was positive for > 30 WBC's/hpf.  It had a foul odor and was yellow cloudy.  He is currently on doxazosin 2 mg daily and oxybutynin ER 5 mg daily by his PCP.        IPSS      12/13/15 1100 01/13/16 0900     International Prostate Symptom Score   How often have you had the sensation of not emptying your bladder? Not at All About half the time    How often have you had to urinate less than every two hours? Less than 1 in 5 times More than half the time    How often have you found you stopped and started again several times when you urinated? Not at All About half the time    How often have you found it difficult to postpone urination? Almost always Almost always    How often have you had a weak urinary stream? Almost always Almost always    How often have you had to strain to start urination? Not at All More than half the time    How many times did you typically get up at night to urinate? 3  Times 3 Times    Total IPSS Score 14 27    Quality of Life due to urinary symptoms   If you were to spend the rest of your life with your urinary condition just the way it is now how would you feel about that? Terrible Terrible       Score:  1-7 Mild 8-19 Moderate 20-35 Severe  Today, he states the Myrbetriq is ineffective.  He has not noticed any difference taking the medication.  He is still having a foul odor to his urine and it is cloudy.  He feels like "his insides are coming out" when he urinates.  He is not having hematuria, fevers, chills, nausea and/or vomiting.  He does not have a history of kidney stones.  His UA today is nitrite positive with >30 WBC's and 3-10 RBC's.    PMH: Past Medical History  Diagnosis Date  . PONV (postoperative nausea and vomiting)   . Hyperlipidemia     takes Simvastin daily  . Coronary artery disease   . COPD (chronic obstructive pulmonary disease) ([place])   . Emphysema   . Shortness of breath     with exertion  . Sleep apnea  uses CPAP  . History of bronchitis     last time a couple of yrs ago  . Pneumonia     hx of last time a couple of yrs ago  . Arthritis   . Joint pain   . Joint swelling   . Chronic back pain     stenosis  . Bruises easily   . H/O hiatal hernia   . GERD (gastroesophageal reflux disease)     takes Omeprazole daily  . History of gastric ulcer   . History of colon polyps   . H/O esophagogastroduodenoscopy   . Hypertension     takes Metoprolol daily and Cardura nightly  . Peripheral edema     takes Lasix daily  . Leg cramps     takes quinine prn  . Diabetes mellitus without complication ([place])     takes Glimepiride daily  . History of blood transfusion     no reaction noted  . Insomnia     takes Trazodone nightly  . History of MRSA infection   . CHF (congestive heart failure) ([place])   . Panic attacks   . Melanoma ([place])   . Anemia   . Liver fatty degeneration   . Anginal pain [facility])     Surgical  History: Past Surgical History  Procedure Laterality Date  . Cholecystectomy  1970  . Replacement total knee bilateral  '86 and 2000  . Total hip arthroplasty Right 2012    [place]  . Neck surgery      x 2  . Coronary artery bypass graft  2000     3 vessels  . Colonoscopy    . Bilateral cataract surgery    . Reverse shoulder arthroplasty  05/30/2012    Procedure: REVERSE SHOULDER ARTHROPLASTY;  Surgeon: Augustin Schooling, MD;  Location: [place];  Service: Orthopedics;  Laterality: Right;  right reverse shoulder arthroplasty  . Back surgery      x 4, last lumb. laminectomy Dr. Annette Stable 2008 cervical fusion x 2  . Ankle fusion Right 11/04/2015    Procedure: ARTHRODESIS ANKLE;  Surgeon: Samara Deist, DPM;  Location: [facility];  Service: Podiatry;  Laterality: Right;  . Prostate laser surgery      x 2    Home Medications:    Medication List       This list is accurate as of: 01/13/16 10:52 AM.  Always use your most recent med list.               acetaminophen 325 MG tablet  Commonly known as:  TYLENOL  Take 650 mg by mouth every 6 (six) hours as needed. Reported on 01/13/2016     aspirin 81 MG tablet  Take 81 mg by mouth daily.     doxazosin 2 MG tablet  Commonly known as:  CARDURA  TAKE ONE (1) TABLET BY MOUTH EVERY DAY     enoxaparin 40 MG/0.4ML injection  Commonly known as:  LOVENOX  Inject 0.4 mLs (40 mg total) into the skin daily.     fluticasone 50 MCG/ACT nasal spray  Commonly known as:  FLONASE  USE ONE TO TWO SPRAYS IN EACH NOSTRIL EVERY DAY     furosemide 40 MG tablet  Commonly known as:  LASIX  TAKE ONE (1) TABLET BY MOUTH EVERY DAY     gabapentin 300 MG capsule  Commonly known as:  NEURONTIN  Take 300 mg by mouth 2 (two) times daily. Reported on 09/13/2015  glimepiride 1 MG tablet  Commonly known as:  AMARYL  Take 1 mg by mouth daily before breakfast.     HYDROcodone-acetaminophen 5-325 MG tablet  Commonly known as:  NORCO/VICODIN  Take 1 tablet by mouth  every 6 (six) hours as needed for moderate pain.     loratadine 10 MG tablet  Commonly known as:  CLARITIN  Take 10 mg by mouth daily.     meloxicam 15 MG tablet  Commonly known as:  MOBIC  TAKE ONE TABLET BY MOUTH DAILY AS NEEDED     metFORMIN 500 MG 24 hr tablet  Commonly known as:  GLUCOPHAGE-XR  Take 1 tablet (500 mg total) by mouth daily.     methocarbamol 500 MG tablet  Commonly known as:  ROBAXIN  Take 1 tablet (500 mg total) by mouth 3 (three) times daily as needed.     metoprolol succinate 25 MG 24 hr tablet  Commonly known as:  TOPROL-XL  Take by mouth. Reported on 01/13/2016     metoprolol tartrate 25 MG tablet  Commonly known as:  LOPRESSOR  Take 25 mg by mouth 2 (two) times daily. Reported on 09/13/2015     mirabegron ER 25 MG Tb24 tablet  Commonly known as:  MYRBETRIQ  Take 1 tablet (25 mg total) by mouth daily.     mirtazapine 7.5 MG tablet  Commonly known as:  REMERON  Reported on 12/13/2015     multivitamin with minerals Tabs tablet  Take 1 tablet by mouth daily.     nystatin powder  Commonly known as:  nystatin  Apply 1 g topically 2 (two) times daily.     omeprazole 20 MG capsule  Commonly known as:  PRILOSEC  TAKE ONE (1) CAPSULE BY MOUTH 2 TIMES DAILY     oxybutynin 5 MG 24 hr tablet  Commonly known as:  DITROPAN-XL  Reported on 01/13/2016     oxyCODONE 5 MG immediate release tablet  Commonly known as:  Oxy IR/ROXICODONE  Take 1-2 tablets (5-10 mg total) by mouth every 4 (four) hours as needed for moderate pain.     polyethylene glycol packet  Commonly known as:  MIRALAX / GLYCOLAX  Take 17 g by mouth daily as needed for moderate constipation.     potassium chloride SA 20 MEQ tablet  Commonly known as:  K-DUR,KLOR-CON  Take 20 mEq by mouth daily.     quiNINE 324 MG capsule  Commonly known as:  QUALAQUIN  Take 648 mg by mouth every 8 (eight) hours. Reported on 01/13/2016     senna 8.6 MG Tabs tablet  Commonly known as:  SENOKOT  Take 1  tablet by mouth.     simvastatin 40 MG tablet  Commonly known as:  ZOCOR  TAKE ONE TABLET BY MOUTH EVERY NIGHT AT BEDTIME     sulfamethoxazole-trimethoprim 800-160 MG tablet  Commonly known as:  BACTRIM DS,SEPTRA DS  Take 1 tablet by mouth every 12 (twelve) hours.     traZODone 100 MG tablet  Commonly known as:  DESYREL  1/2 to 1 tablet at bedtime as needed for insomnia.     zinc oxide 11.3 % Crea cream  Commonly known as:  BALMEX  Apply 1 application topically 2 (two) times daily.        Allergies:  Allergies  Allergen Reactions  . Hydrocodone-Acetaminophen Shortness Of Breath  . Propoxyphene Anaphylaxis  . Codeine Sulfate     Patient denies  . Darvon [Propoxyphene Hcl] Other (See Comments)  Short of breath  . Demerol [Meperidine] Other (See Comments)    Short of breath    Family History: Family History  Problem Relation Age of Onset  . Alzheimer's disease Mother   . Heart attack Father   . Bipolar disorder Brother   . Kidney disease Neg Hx   . Prostate cancer Neg Hx     Social History:  reports that he quit smoking about 47 years ago. His smoking use included Cigarettes. He has a 25 pack-year smoking history. He does not have any smokeless tobacco history on file. He reports that he does not drink alcohol or use illicit drugs.  ROS: UROLOGY Frequent Urination?: Yes Hard to postpone urination?: Yes Burning/pain with urination?: Yes Get up at night to urinate?: Yes Leakage of urine?: No Urine stream starts and stops?: No Trouble starting stream?: No Do you have to strain to urinate?: No Blood in urine?: No Urinary tract infection?: Yes Sexually transmitted disease?: No Injury to kidneys or bladder?: No Painful intercourse?: No Weak stream?: No Erection problems?: No Penile pain?: No  Gastrointestinal Nausea?: No Vomiting?: No Indigestion/heartburn?: No Diarrhea?: No Constipation?: No  Constitutional Fever: No Night sweats?: No Weight loss?:  No Fatigue?: No  Skin Skin rash/lesions?: No Itching?: No  Eyes Blurred vision?: No Double vision?: No  Ears/Nose/Throat Sore throat?: No Sinus problems?: No  Hematologic/Lymphatic Swollen glands?: No Easy bruising?: No  Cardiovascular Leg swelling?: No Chest pain?: No  Respiratory Cough?: No Shortness of breath?: No  Endocrine Excessive thirst?: No  Musculoskeletal Back pain?: No Joint pain?: No  Neurological Headaches?: No Dizziness?: No  Psychologic Depression?: No Anxiety?: No  Physical Exam: BP 115/73 mmHg  Pulse 103  Ht 5\' 1"  (1.549 m)  Wt 200 lb (90.719 kg)  BMI 37.81 kg/m2  Constitutional: Well nourished. Alert and oriented, No acute distress. HEENT: [place] AT, moist mucus membranes. Trachea midline, no masses. Cardiovascular: No clubbing, cyanosis, or edema. Respiratory: Normal respiratory effort, no increased work of breathing. GI: Abdomen is soft, non tender, non distended, no abdominal masses. Obese. Liver and spleen not palpable.  No hernias appreciated.  Stool sample for occult testing is not indicated.   GU: No CVA tenderness.  No bladder fullness or masses.  Patient unable to stand for exam.   Rectal: Deferred.  Skin: No rashes, bruises or suspicious lesions. Lymph: No cervical or inguinal adenopathy. Neurologic: Grossly intact, no focal deficits, moving all 4 extremities. Psychiatric: Normal mood and affect.  Laboratory Data: Lab Results  Component Value Date   WBC 8.1 11/07/2015   HGB 10.4* 11/07/2015   HCT 30.4* 11/07/2015   MCV 91.7 11/07/2015   PLT 119* 11/07/2015    Lab Results  Component Value Date   CREATININE 1.08 11/07/2015      Lab Results  Component Value Date   HGBA1C 6.9* 11/04/2015        Component Value Date/Time   CHOL 144 10/10/2015 0916   HDL 54 10/10/2015 0916   CHOLHDL 2.7 10/10/2015 0916   LDLCALC 63 10/10/2015 0916    Lab Results  Component Value Date   AST 29 10/10/2015   Lab Results    Component Value Date   ALT 41 10/10/2015    Pertinent Imaging: Results for orders placed or performed in visit on 01/13/16  BLADDER SCAN AMB NON-IMAGING  Result Value Ref Range   Scan Result 74    Urinalysis Results for orders placed or performed in visit on 01/13/16  Microscopic Examination  Result Value  Ref Range   WBC, UA >30 (A) 0 -  5 /hpf   RBC, UA 3-10 (A) 0 -  2 /hpf   Epithelial Cells (non renal) None seen 0 - 10 /hpf   Bacteria, UA Few None seen/Few  Urinalysis, Complete  Result Value Ref Range   Specific Gravity, UA 1.010 1.005 - 1.030   pH, UA 5.5 5.0 - 7.5   Color, UA Yellow Yellow   Appearance Ur Cloudy (A) Clear   Leukocytes, UA 3+ (A) Negative   Protein, UA 1+ (A) Negative/Trace   Glucose, UA Negative Negative   Ketones, UA Negative Negative   RBC, UA 2+ (A) Negative   Bilirubin, UA Negative Negative   Urobilinogen, Ur 0.2 0.2 - 1.0 mg/dL   Nitrite, UA Positive (A) Negative   Microscopic Examination See below:   BLADDER SCAN AMB NON-IMAGING  Result Value Ref Range   Scan Result 74       Assessment & Plan:    1. Urge incontinence:    I have increased his Myrbetriq to 50 mg daily, # 35 samples.  His incontinence may be aggravated by his infection.  He will RTC in 2 weeks for recheck and PVR.   - Urinalysis, Complete - BLADDER SCAN AMB NON-IMAGING  2. Frequency:  See above.  3. Nocturia:  I explained to the patient that nocturia is often multi-factorial and difficult to treat.  Sleeping disorders, heart conditions and peripheral vascular disease, diabetes,  enlarged prostate or urethral stricture causing bladder outlet obstruction and/or certain medications.  I have suggested that the patient avoid caffeine and alcohol in the evening. He may also benefit from fluid restrictions after 6:00 in the evening and voiding just prior to bedtime.  Patient has sleep apnea and sleeps with a CPAP machine.     Return in about 2 weeks (around 01/27/2016) for  recheck on UTI symptoms and PVR .  These notes generated with voice recognition software. I apologize for typographical errors.  Zara Council, [facility] [address], [place], [place] [address] [phone]

## 2016-01-16 LAB — CULTURE, URINE COMPREHENSIVE

## 2016-01-17 ENCOUNTER — Ambulatory Visit (INDEPENDENT_AMBULATORY_CARE_PROVIDER_SITE_OTHER): Payer: Medicare Other

## 2016-01-17 VITALS — BP 120/70 | HR 70 | Temp 97.6°F | Wt 200.0 lb

## 2016-01-17 DIAGNOSIS — N184 Chronic kidney disease, stage 4 (severe): Secondary | ICD-10-CM | POA: Diagnosis not present

## 2016-01-17 DIAGNOSIS — M4802 Spinal stenosis, cervical region: Secondary | ICD-10-CM | POA: Diagnosis not present

## 2016-01-17 DIAGNOSIS — M19071 Primary osteoarthritis, right ankle and foot: Secondary | ICD-10-CM | POA: Diagnosis not present

## 2016-01-17 DIAGNOSIS — D649 Anemia, unspecified: Secondary | ICD-10-CM | POA: Diagnosis not present

## 2016-01-17 DIAGNOSIS — I25119 Atherosclerotic heart disease of native coronary artery with unspecified angina pectoris: Secondary | ICD-10-CM | POA: Diagnosis not present

## 2016-01-17 DIAGNOSIS — I129 Hypertensive chronic kidney disease with stage 1 through stage 4 chronic kidney disease, or unspecified chronic kidney disease: Secondary | ICD-10-CM | POA: Diagnosis not present

## 2016-01-17 DIAGNOSIS — K219 Gastro-esophageal reflux disease without esophagitis: Secondary | ICD-10-CM | POA: Diagnosis not present

## 2016-01-17 DIAGNOSIS — Z981 Arthrodesis status: Secondary | ICD-10-CM | POA: Diagnosis not present

## 2016-01-17 DIAGNOSIS — E1122 Type 2 diabetes mellitus with diabetic chronic kidney disease: Secondary | ICD-10-CM | POA: Diagnosis not present

## 2016-01-17 DIAGNOSIS — N390 Urinary tract infection, site not specified: Secondary | ICD-10-CM

## 2016-01-17 DIAGNOSIS — E785 Hyperlipidemia, unspecified: Secondary | ICD-10-CM | POA: Diagnosis not present

## 2016-01-17 DIAGNOSIS — G4733 Obstructive sleep apnea (adult) (pediatric): Secondary | ICD-10-CM | POA: Diagnosis not present

## 2016-01-17 DIAGNOSIS — J449 Chronic obstructive pulmonary disease, unspecified: Secondary | ICD-10-CM | POA: Diagnosis not present

## 2016-01-17 LAB — URINALYSIS, COMPLETE
BILIRUBIN UA: NEGATIVE
GLUCOSE, UA: NEGATIVE
Ketones, UA: NEGATIVE
Nitrite, UA: POSITIVE — AB
PROTEIN UA: NEGATIVE
SPEC GRAV UA: 1.02 (ref 1.005–1.030)
UUROB: 0.2 mg/dL (ref 0.2–1.0)
pH, UA: 5 (ref 5.0–7.5)

## 2016-01-17 LAB — MICROSCOPIC EXAMINATION: Epithelial Cells (non renal): NONE SEEN /hpf (ref 0–10)

## 2016-01-17 NOTE — Progress Notes (Signed)
Pt came in today for a u/a and ucx from previous call from daughter about UTI like symptoms. Pt VS- 120/70, 97.6, 70, 200lbs. Pt described urinary symptoms to be pressure upon urination, dysuria, and "everything is falling out". Pt denied n/v, f/c. Pt stated he stopped taking his lasix and he felt the best he has ever felt and that has helped with his OAB symptoms. Reinforced with pt when ucx results return he will receive a call. Pt voiced understanding.

## 2016-01-17 NOTE — Telephone Encounter (Signed)
Spoke with pt in reference to UTI like symptoms. Pt stated that he is still having dysuria. Pt will be added to the nurse schedule.

## 2016-01-19 LAB — CULTURE, URINE COMPREHENSIVE

## 2016-01-20 ENCOUNTER — Telehealth: Payer: Self-pay | Admitting: Family Medicine

## 2016-01-20 ENCOUNTER — Telehealth: Payer: Self-pay

## 2016-01-20 DIAGNOSIS — E785 Hyperlipidemia, unspecified: Secondary | ICD-10-CM | POA: Diagnosis not present

## 2016-01-20 DIAGNOSIS — E1122 Type 2 diabetes mellitus with diabetic chronic kidney disease: Secondary | ICD-10-CM | POA: Diagnosis not present

## 2016-01-20 DIAGNOSIS — K219 Gastro-esophageal reflux disease without esophagitis: Secondary | ICD-10-CM | POA: Diagnosis not present

## 2016-01-20 DIAGNOSIS — M4802 Spinal stenosis, cervical region: Secondary | ICD-10-CM | POA: Diagnosis not present

## 2016-01-20 DIAGNOSIS — N184 Chronic kidney disease, stage 4 (severe): Secondary | ICD-10-CM | POA: Diagnosis not present

## 2016-01-20 DIAGNOSIS — Z981 Arthrodesis status: Secondary | ICD-10-CM | POA: Diagnosis not present

## 2016-01-20 DIAGNOSIS — J449 Chronic obstructive pulmonary disease, unspecified: Secondary | ICD-10-CM | POA: Diagnosis not present

## 2016-01-20 DIAGNOSIS — I25119 Atherosclerotic heart disease of native coronary artery with unspecified angina pectoris: Secondary | ICD-10-CM | POA: Diagnosis not present

## 2016-01-20 DIAGNOSIS — G4733 Obstructive sleep apnea (adult) (pediatric): Secondary | ICD-10-CM | POA: Diagnosis not present

## 2016-01-20 DIAGNOSIS — D649 Anemia, unspecified: Secondary | ICD-10-CM | POA: Diagnosis not present

## 2016-01-20 DIAGNOSIS — I129 Hypertensive chronic kidney disease with stage 1 through stage 4 chronic kidney disease, or unspecified chronic kidney disease: Secondary | ICD-10-CM | POA: Diagnosis not present

## 2016-01-20 DIAGNOSIS — M19071 Primary osteoarthritis, right ankle and foot: Secondary | ICD-10-CM | POA: Diagnosis not present

## 2016-01-20 NOTE — Telephone Encounter (Signed)
Mark Terry with [facility] called stating pt resting heart rate is between 110 and 120.  Pt states he has taken his medication today.  CB#[phone] and Pt CB#[phone]/MW

## 2016-01-20 NOTE — Telephone Encounter (Signed)
Spoke with pt. He denies any chest pain, shortness of breath, swelling or fever. Says that he had 2 therapist come in this morning, and he felt like he was nervous and reports that he always gets nervous when these therapist come to his home. He says he feels fine and will call if he develops any of these symptoms. Thanks.

## 2016-01-20 NOTE — Telephone Encounter (Signed)
Is he having any symptoms. Specifically shortness of breath, swelling, fever, or any unusual pain?

## 2016-01-20 NOTE — Telephone Encounter (Signed)
Mark Terry with [facility] to advise pt fell out of bed yesterday.  Pt did use his bed rail.  Pt has 3 skin tears on his left arm and no fractures.  UM:[id number]

## 2016-01-20 NOTE — Telephone Encounter (Signed)
-----   Message from Hollice Espy, MD sent at 01/20/2016 12:11 PM EDT ----- Please treat this infection with Bactrim DS twice a day 7 days  Hollice Espy, MD

## 2016-01-20 NOTE — Telephone Encounter (Signed)
Spoke with Leveda Anna in reference to pt taking bactrim given by Larene Beach and pt current +ucx. Leveda Anna stated that she did pick up a script of bactrim on Monday. Therefore per Dr. Erlene Quan bactrim will not be called in today. Leveda Anna voiced understanding of completing current round of abx. Pt will f/u with Larene Beach in 2 weeks.

## 2016-01-23 DIAGNOSIS — E785 Hyperlipidemia, unspecified: Secondary | ICD-10-CM | POA: Diagnosis not present

## 2016-01-23 DIAGNOSIS — D649 Anemia, unspecified: Secondary | ICD-10-CM | POA: Diagnosis not present

## 2016-01-23 DIAGNOSIS — N184 Chronic kidney disease, stage 4 (severe): Secondary | ICD-10-CM | POA: Diagnosis not present

## 2016-01-23 DIAGNOSIS — E1122 Type 2 diabetes mellitus with diabetic chronic kidney disease: Secondary | ICD-10-CM | POA: Diagnosis not present

## 2016-01-23 DIAGNOSIS — Z981 Arthrodesis status: Secondary | ICD-10-CM | POA: Diagnosis not present

## 2016-01-23 DIAGNOSIS — I129 Hypertensive chronic kidney disease with stage 1 through stage 4 chronic kidney disease, or unspecified chronic kidney disease: Secondary | ICD-10-CM | POA: Diagnosis not present

## 2016-01-23 DIAGNOSIS — I25119 Atherosclerotic heart disease of native coronary artery with unspecified angina pectoris: Secondary | ICD-10-CM | POA: Diagnosis not present

## 2016-01-23 DIAGNOSIS — M19071 Primary osteoarthritis, right ankle and foot: Secondary | ICD-10-CM | POA: Diagnosis not present

## 2016-01-23 DIAGNOSIS — M4802 Spinal stenosis, cervical region: Secondary | ICD-10-CM | POA: Diagnosis not present

## 2016-01-23 DIAGNOSIS — J449 Chronic obstructive pulmonary disease, unspecified: Secondary | ICD-10-CM | POA: Diagnosis not present

## 2016-01-23 DIAGNOSIS — K219 Gastro-esophageal reflux disease without esophagitis: Secondary | ICD-10-CM | POA: Diagnosis not present

## 2016-01-23 DIAGNOSIS — G4733 Obstructive sleep apnea (adult) (pediatric): Secondary | ICD-10-CM | POA: Diagnosis not present

## 2016-01-24 DIAGNOSIS — J449 Chronic obstructive pulmonary disease, unspecified: Secondary | ICD-10-CM | POA: Diagnosis not present

## 2016-01-24 DIAGNOSIS — E785 Hyperlipidemia, unspecified: Secondary | ICD-10-CM | POA: Diagnosis not present

## 2016-01-24 DIAGNOSIS — Z981 Arthrodesis status: Secondary | ICD-10-CM | POA: Diagnosis not present

## 2016-01-24 DIAGNOSIS — I25119 Atherosclerotic heart disease of native coronary artery with unspecified angina pectoris: Secondary | ICD-10-CM | POA: Diagnosis not present

## 2016-01-24 DIAGNOSIS — E1122 Type 2 diabetes mellitus with diabetic chronic kidney disease: Secondary | ICD-10-CM | POA: Diagnosis not present

## 2016-01-24 DIAGNOSIS — G4733 Obstructive sleep apnea (adult) (pediatric): Secondary | ICD-10-CM | POA: Diagnosis not present

## 2016-01-24 DIAGNOSIS — D649 Anemia, unspecified: Secondary | ICD-10-CM | POA: Diagnosis not present

## 2016-01-24 DIAGNOSIS — N184 Chronic kidney disease, stage 4 (severe): Secondary | ICD-10-CM | POA: Diagnosis not present

## 2016-01-24 DIAGNOSIS — M4802 Spinal stenosis, cervical region: Secondary | ICD-10-CM | POA: Diagnosis not present

## 2016-01-24 DIAGNOSIS — K219 Gastro-esophageal reflux disease without esophagitis: Secondary | ICD-10-CM | POA: Diagnosis not present

## 2016-01-24 DIAGNOSIS — M19071 Primary osteoarthritis, right ankle and foot: Secondary | ICD-10-CM | POA: Diagnosis not present

## 2016-01-24 DIAGNOSIS — I129 Hypertensive chronic kidney disease with stage 1 through stage 4 chronic kidney disease, or unspecified chronic kidney disease: Secondary | ICD-10-CM | POA: Diagnosis not present

## 2016-01-26 DIAGNOSIS — M4802 Spinal stenosis, cervical region: Secondary | ICD-10-CM | POA: Diagnosis not present

## 2016-01-26 DIAGNOSIS — G4733 Obstructive sleep apnea (adult) (pediatric): Secondary | ICD-10-CM | POA: Diagnosis not present

## 2016-01-26 DIAGNOSIS — N184 Chronic kidney disease, stage 4 (severe): Secondary | ICD-10-CM | POA: Diagnosis not present

## 2016-01-26 DIAGNOSIS — E785 Hyperlipidemia, unspecified: Secondary | ICD-10-CM | POA: Diagnosis not present

## 2016-01-26 DIAGNOSIS — J449 Chronic obstructive pulmonary disease, unspecified: Secondary | ICD-10-CM | POA: Diagnosis not present

## 2016-01-26 DIAGNOSIS — Z981 Arthrodesis status: Secondary | ICD-10-CM | POA: Diagnosis not present

## 2016-01-26 DIAGNOSIS — K219 Gastro-esophageal reflux disease without esophagitis: Secondary | ICD-10-CM | POA: Diagnosis not present

## 2016-01-26 DIAGNOSIS — M19071 Primary osteoarthritis, right ankle and foot: Secondary | ICD-10-CM | POA: Diagnosis not present

## 2016-01-26 DIAGNOSIS — D649 Anemia, unspecified: Secondary | ICD-10-CM | POA: Diagnosis not present

## 2016-01-26 DIAGNOSIS — I129 Hypertensive chronic kidney disease with stage 1 through stage 4 chronic kidney disease, or unspecified chronic kidney disease: Secondary | ICD-10-CM | POA: Diagnosis not present

## 2016-01-26 DIAGNOSIS — I25119 Atherosclerotic heart disease of native coronary artery with unspecified angina pectoris: Secondary | ICD-10-CM | POA: Diagnosis not present

## 2016-01-26 DIAGNOSIS — E1122 Type 2 diabetes mellitus with diabetic chronic kidney disease: Secondary | ICD-10-CM | POA: Diagnosis not present

## 2016-01-27 DIAGNOSIS — E785 Hyperlipidemia, unspecified: Secondary | ICD-10-CM | POA: Diagnosis not present

## 2016-01-27 DIAGNOSIS — Z981 Arthrodesis status: Secondary | ICD-10-CM | POA: Diagnosis not present

## 2016-01-27 DIAGNOSIS — I25119 Atherosclerotic heart disease of native coronary artery with unspecified angina pectoris: Secondary | ICD-10-CM | POA: Diagnosis not present

## 2016-01-27 DIAGNOSIS — E1122 Type 2 diabetes mellitus with diabetic chronic kidney disease: Secondary | ICD-10-CM | POA: Diagnosis not present

## 2016-01-27 DIAGNOSIS — D649 Anemia, unspecified: Secondary | ICD-10-CM | POA: Diagnosis not present

## 2016-01-27 DIAGNOSIS — I129 Hypertensive chronic kidney disease with stage 1 through stage 4 chronic kidney disease, or unspecified chronic kidney disease: Secondary | ICD-10-CM | POA: Diagnosis not present

## 2016-01-27 DIAGNOSIS — M19071 Primary osteoarthritis, right ankle and foot: Secondary | ICD-10-CM | POA: Diagnosis not present

## 2016-01-27 DIAGNOSIS — J449 Chronic obstructive pulmonary disease, unspecified: Secondary | ICD-10-CM | POA: Diagnosis not present

## 2016-01-27 DIAGNOSIS — M4802 Spinal stenosis, cervical region: Secondary | ICD-10-CM | POA: Diagnosis not present

## 2016-01-27 DIAGNOSIS — N184 Chronic kidney disease, stage 4 (severe): Secondary | ICD-10-CM | POA: Diagnosis not present

## 2016-01-27 DIAGNOSIS — K219 Gastro-esophageal reflux disease without esophagitis: Secondary | ICD-10-CM | POA: Diagnosis not present

## 2016-01-27 DIAGNOSIS — G4733 Obstructive sleep apnea (adult) (pediatric): Secondary | ICD-10-CM | POA: Diagnosis not present

## 2016-01-30 DIAGNOSIS — G4733 Obstructive sleep apnea (adult) (pediatric): Secondary | ICD-10-CM | POA: Diagnosis not present

## 2016-01-30 DIAGNOSIS — M4802 Spinal stenosis, cervical region: Secondary | ICD-10-CM | POA: Diagnosis not present

## 2016-01-30 DIAGNOSIS — D649 Anemia, unspecified: Secondary | ICD-10-CM | POA: Diagnosis not present

## 2016-01-30 DIAGNOSIS — I129 Hypertensive chronic kidney disease with stage 1 through stage 4 chronic kidney disease, or unspecified chronic kidney disease: Secondary | ICD-10-CM | POA: Diagnosis not present

## 2016-01-30 DIAGNOSIS — K219 Gastro-esophageal reflux disease without esophagitis: Secondary | ICD-10-CM | POA: Diagnosis not present

## 2016-01-30 DIAGNOSIS — N184 Chronic kidney disease, stage 4 (severe): Secondary | ICD-10-CM | POA: Diagnosis not present

## 2016-01-30 DIAGNOSIS — E1122 Type 2 diabetes mellitus with diabetic chronic kidney disease: Secondary | ICD-10-CM | POA: Diagnosis not present

## 2016-01-30 DIAGNOSIS — I25119 Atherosclerotic heart disease of native coronary artery with unspecified angina pectoris: Secondary | ICD-10-CM | POA: Diagnosis not present

## 2016-01-30 DIAGNOSIS — M19071 Primary osteoarthritis, right ankle and foot: Secondary | ICD-10-CM | POA: Diagnosis not present

## 2016-01-30 DIAGNOSIS — Z981 Arthrodesis status: Secondary | ICD-10-CM | POA: Diagnosis not present

## 2016-01-30 DIAGNOSIS — J449 Chronic obstructive pulmonary disease, unspecified: Secondary | ICD-10-CM | POA: Diagnosis not present

## 2016-01-30 DIAGNOSIS — E785 Hyperlipidemia, unspecified: Secondary | ICD-10-CM | POA: Diagnosis not present

## 2016-02-03 ENCOUNTER — Encounter: Payer: Self-pay | Admitting: Urology

## 2016-02-03 ENCOUNTER — Ambulatory Visit (INDEPENDENT_AMBULATORY_CARE_PROVIDER_SITE_OTHER): Payer: Medicare Other | Admitting: Urology

## 2016-02-03 VITALS — BP 123/74 | HR 88 | Ht 61.0 in | Wt 272.9 lb

## 2016-02-03 DIAGNOSIS — N3941 Urge incontinence: Secondary | ICD-10-CM | POA: Diagnosis not present

## 2016-02-03 DIAGNOSIS — R351 Nocturia: Secondary | ICD-10-CM

## 2016-02-03 DIAGNOSIS — R350 Frequency of micturition: Secondary | ICD-10-CM | POA: Diagnosis not present

## 2016-02-03 MED ORDER — MIRABEGRON ER 50 MG PO TB24: 50.0000 mg | ORAL_TABLET | Freq: Every day | ORAL | 12 refills | Status: DC

## 2016-02-03 NOTE — Progress Notes (Signed)
11:48 AM   Mark Terry. 03/13/1935 ET:[id number]  Referring provider: Birdie Sons, MD [address] [place], [place] [address]  Chief Complaint  Patient presents with  . Follow-up    2 weeks for urinary symptoms    HPI: Patient is a 81 year old Caucasian male who presents after a trial of Myrbetriq 50 mg and a recheck of an UTI.  Background history Patient was a self-referral for urinary incontinence.  Patient's states that approximately 2 months ago he started to experience urinary incontinence.  He states he would feel the urge to void and the urine would just come out.  He had his right foot operated on 10/27/2015, but he states the symptoms were present prior to that surgery.  He makes 5-6 trips to the bathroom during the day, 5-6 trips to the bathroom during the night and has 8-10 episodes of urge incontinence daily.  He wears depends on a continual basis.  He is also experiencing dysuria, intermittency and a weak urinary stream.  His IPSS score was 14/6.  His UA was positive for > 30 WBC's/hpf.  It had a foul odor and was yellow cloudy.  He was found to have an UTI and was given the appropriate antibiotics.        IPSS    Row Name 12/13/15 1100 01/13/16 0900       International Prostate Symptom Score   How often have you had the sensation of not emptying your bladder? Not at All About half the time    How often have you had to urinate less than every two hours? Less than 1 in 5 times More than half the time    How often have you found you stopped and started again several times when you urinated? Not at All About half the time    How often have you found it difficult to postpone urination? Almost always Almost always    How often have you had a weak urinary stream? Almost always Almost always    How often have you had to strain to start urination? Not at All More than half the time    How many times did you typically get up at night to urinate? 3  Times 3 Times    Total IPSS Score 14 27      Quality of Life due to urinary symptoms   If you were to spend the rest of your life with your urinary condition just the way it is now how would you feel about that? Terrible Terrible       Score:  1-7 Mild 8-19 Moderate 20-35 Severe  Today, he states the Myrbetriq is effective.  He has noticed a slight improvement taking the Myrbetriq 50 mg daily.  He also states his UTI symptoms have abated.   He has not had gross hematuria, suprapubic pain and dysuria.  He has not had fevers, chills, nausea or vomiting.  He is still having significant nocturia.  He is drinking coffee in the morning and sweat tea all throughout the day.    PMH: Past Medical History:  Diagnosis Date  . Anemia   . Anginal pain ([place])   . Arthritis   . Bruises easily   . CHF (congestive heart failure) ([place])   . Chronic back pain    stenosis  . COPD (chronic obstructive pulmonary disease) ([place])   . Coronary artery disease   . Diabetes mellitus without complication ([place])  takes Glimepiride daily  . Emphysema   . GERD (gastroesophageal reflux disease)    takes Omeprazole daily  . H/O esophagogastroduodenoscopy   . H/O hiatal hernia   . History of blood transfusion    no reaction noted  . History of bronchitis    last time a couple of yrs ago  . History of colon polyps   . History of gastric ulcer   . History of MRSA infection   . Hyperlipidemia    takes Simvastin daily  . Hypertension    takes Metoprolol daily and Cardura nightly  . Insomnia    takes Trazodone nightly  . Joint pain   . Joint swelling   . Leg cramps    takes quinine prn  . Liver fatty degeneration   . Melanoma ([place])   . Panic attacks   . Peripheral edema    takes Lasix daily  . Pneumonia    hx of last time a couple of yrs ago  . PONV (postoperative nausea and vomiting)   . Shortness of breath    with exertion  . Sleep apnea    uses CPAP    Surgical History: Past Surgical  History:  Procedure Laterality Date  . ANKLE FUSION Right 11/04/2015   Procedure: ARTHRODESIS ANKLE;  Surgeon: Samara Deist, DPM;  Location: [facility];  Service: Podiatry;  Laterality: Right;  . BACK SURGERY     x 4, last lumb. laminectomy Dr. Annette Stable 2008 cervical fusion x 2  . bilateral cataract surgery    . CHOLECYSTECTOMY  1970  . COLONOSCOPY    . CORONARY ARTERY BYPASS GRAFT  2000    3 vessels  . NECK SURGERY     x 2  . prostate laser surgery     x 2  . REPLACEMENT TOTAL KNEE BILATERAL  '86 and 2000  . REVERSE SHOULDER ARTHROPLASTY  05/30/2012   Procedure: REVERSE SHOULDER ARTHROPLASTY;  Surgeon: Augustin Schooling, MD;  Location: [facility];  Service: Orthopedics;  Laterality: Right;  right reverse shoulder arthroplasty  . TOTAL HIP ARTHROPLASTY Right 2012   [place]    Home Medications:    Medication List       Accurate as of 02/03/16 11:48 AM. Always use your most recent med list.          acetaminophen 325 MG tablet Commonly known as:  TYLENOL Take 650 mg by mouth every 6 (six) hours as needed. Reported on 01/13/2016   aspirin 81 MG tablet Take 81 mg by mouth daily.   doxazosin 2 MG tablet Commonly known as:  CARDURA TAKE ONE (1) TABLET BY MOUTH EVERY DAY   enoxaparin 40 MG/0.4ML injection Commonly known as:  LOVENOX Inject 0.4 mLs (40 mg total) into the skin daily.   fluticasone 50 MCG/ACT nasal spray Commonly known as:  FLONASE USE ONE TO TWO SPRAYS IN EACH NOSTRIL EVERY DAY   furosemide 40 MG tablet Commonly known as:  LASIX TAKE ONE (1) TABLET BY MOUTH EVERY DAY   gabapentin 300 MG capsule Commonly known as:  NEURONTIN Take 300 mg by mouth 2 (two) times daily. Reported on 09/13/2015   glimepiride 1 MG tablet Commonly known as:  AMARYL Take 1 mg by mouth daily before breakfast.   HYDROcodone-acetaminophen 5-325 MG tablet Commonly known as:  NORCO/VICODIN Take 1 tablet by mouth every 6 (six) hours as needed for moderate pain.   loratadine 10 MG  tablet Commonly known as:  CLARITIN Take 10 mg by mouth daily.  meloxicam 15 MG tablet Commonly known as:  MOBIC TAKE ONE TABLET BY MOUTH DAILY AS NEEDED   metFORMIN 500 MG 24 hr tablet Commonly known as:  GLUCOPHAGE-XR Take 1 tablet (500 mg total) by mouth daily.   methocarbamol 500 MG tablet Commonly known as:  ROBAXIN Take 1 tablet (500 mg total) by mouth 3 (three) times daily as needed.   metoprolol succinate 25 MG 24 hr tablet Commonly known as:  TOPROL-XL Take by mouth. Reported on 01/13/2016   metoprolol tartrate 25 MG tablet Commonly known as:  LOPRESSOR Take 25 mg by mouth 2 (two) times daily. Reported on 09/13/2015   mirabegron ER 50 MG Tb24 tablet Commonly known as:  MYRBETRIQ Take 1 tablet (50 mg total) by mouth daily.   mirtazapine 7.5 MG tablet Commonly known as:  REMERON Reported on 12/13/2015   multivitamin with minerals Tabs tablet Take 1 tablet by mouth daily.   nystatin powder Commonly known as:  nystatin Apply 1 g topically 2 (two) times daily.   omeprazole 20 MG capsule Commonly known as:  PRILOSEC TAKE ONE (1) CAPSULE BY MOUTH 2 TIMES DAILY   oxybutynin 5 MG 24 hr tablet Commonly known as:  DITROPAN-XL Reported on 01/13/2016   oxyCODONE 5 MG immediate release tablet Commonly known as:  Oxy IR/ROXICODONE Take 1-2 tablets (5-10 mg total) by mouth every 4 (four) hours as needed for moderate pain.   polyethylene glycol packet Commonly known as:  MIRALAX / GLYCOLAX Take 17 g by mouth daily as needed for moderate constipation.   potassium chloride SA 20 MEQ tablet Commonly known as:  K-DUR,KLOR-CON Take 20 mEq by mouth daily.   quiNINE 324 MG capsule Commonly known as:  QUALAQUIN Take 648 mg by mouth every 8 (eight) hours. Reported on 01/13/2016   senna 8.6 MG Tabs tablet Commonly known as:  SENOKOT Take 1 tablet by mouth.   simvastatin 40 MG tablet Commonly known as:  ZOCOR TAKE ONE TABLET BY MOUTH EVERY NIGHT AT BEDTIME    sulfamethoxazole-trimethoprim 800-160 MG tablet Commonly known as:  BACTRIM DS,SEPTRA DS Take 1 tablet by mouth every 12 (twelve) hours.   traZODone 100 MG tablet Commonly known as:  DESYREL 1/2 to 1 tablet at bedtime as needed for insomnia.   zinc oxide 11.3 % Crea cream Commonly known as:  BALMEX Apply 1 application topically 2 (two) times daily.       Allergies:  Allergies  Allergen Reactions  . Hydrocodone-Acetaminophen Shortness Of Breath  . Propoxyphene Anaphylaxis  . Codeine Sulfate     Patient denies  . Darvon [Propoxyphene Hcl] Other (See Comments)    Short of breath  . Demerol [Meperidine] Other (See Comments)    Short of breath    Family History: Family History  Problem Relation Age of Onset  . Alzheimer's disease Mother   . Heart attack Father   . Bipolar disorder Brother   . Kidney disease Neg Hx   . Prostate cancer Neg Hx     Social History:  reports that he quit smoking about 47 years ago. His smoking use included Cigarettes. He has a 25.00 pack-year smoking history. He has never used smokeless tobacco. He reports that he does not drink alcohol or use drugs.  ROS: UROLOGY Frequent Urination?: Yes Hard to postpone urination?: Yes Burning/pain with urination?: No Get up at night to urinate?: No Leakage of urine?: Yes Urine stream starts and stops?: No Trouble starting stream?: No Do you have to strain to urinate?: No  Blood in urine?: No Urinary tract infection?: No Sexually transmitted disease?: No Injury to kidneys or bladder?: No Painful intercourse?: No Weak stream?: No Erection problems?: No Penile pain?: No  Gastrointestinal Nausea?: No Vomiting?: No Indigestion/heartburn?: No Diarrhea?: No Constipation?: No  Constitutional Fever: No Night sweats?: No Weight loss?: No Fatigue?: No  Skin Skin rash/lesions?: No Itching?: No  Eyes Blurred vision?: No Double vision?: No  Ears/Nose/Throat Sore throat?: No Sinus  problems?: No  Hematologic/Lymphatic Swollen glands?: No Easy bruising?: No  Cardiovascular Leg swelling?: No Chest pain?: No  Respiratory Cough?: No Shortness of breath?: No  Endocrine Excessive thirst?: No  Musculoskeletal Back pain?: No Joint pain?: No  Neurological Headaches?: No Dizziness?: No  Psychologic Depression?: No Anxiety?: No  Physical Exam: BP 123/74   Pulse 88   Ht 5\' 1"  (1.549 m)   Wt 272 lb 14.4 oz (123.8 kg)   BMI 51.56 kg/m   Constitutional: Well nourished. Alert and oriented, No acute distress. HEENT: [place] AT, moist mucus membranes. Trachea midline, no masses. Cardiovascular: No clubbing, cyanosis, or edema. Respiratory: Normal respiratory effort, no increased work of breathing. GI: Abdomen is soft, non tender, non distended, no abdominal masses. Obese. Liver and spleen not palpable.  No hernias appreciated.  Stool sample for occult testing is not indicated.   GU: No CVA tenderness.  No bladder fullness or masses.  Patient unable to stand for exam.   Rectal: Deferred.  Skin: No rashes, bruises or suspicious lesions. Lymph: No cervical or inguinal adenopathy. Neurologic: Grossly intact, no focal deficits, moving all 4 extremities. Psychiatric: Normal mood and affect.  Laboratory Data: Lab Results  Component Value Date   WBC 8.1 11/07/2015   HGB 10.4 (L) 11/07/2015   HCT 30.4 (L) 11/07/2015   MCV 91.7 11/07/2015   PLT 119 (L) 11/07/2015    Lab Results  Component Value Date   CREATININE 1.08 11/07/2015      Lab Results  Component Value Date   HGBA1C 6.9 (H) 11/04/2015        Component Value Date/Time   CHOL 144 10/10/2015 0916   HDL 54 10/10/2015 0916   CHOLHDL 2.7 10/10/2015 0916   LDLCALC 63 10/10/2015 0916    Lab Results  Component Value Date   AST 29 10/10/2015   Lab Results  Component Value Date   ALT 41 10/10/2015    Assessment & Plan:    1. Urge incontinence:    I have increased his Myrbetriq to 50 mg  daily.  He feels the medication is helping and would like to continue it.  We will see him in 3 months for a PVR and IPSS.    2. Frequency:  See above.  3. Nocturia:  I explained to the patient that nocturia is often multi-factorial and difficult to treat.  Sleeping disorders, heart conditions and peripheral vascular disease, diabetes,  enlarged prostate or urethral stricture causing bladder outlet obstruction and/or certain medications.  Patient will not consume any caffeine after noon.   He may also benefit from fluid restrictions after 6:00 in the evening and voiding just prior to bedtime.  Patient has sleep apnea and sleeps with a CPAP machine.     Return in about 3 months (around 05/05/2016) for PVR and IPSS .  These notes generated with voice recognition software. I apologize for typographical errors.  Zara Council, [facility] [address], [place], [place] [address] [phone]

## 2016-02-08 ENCOUNTER — Telehealth: Payer: Self-pay | Admitting: Urology

## 2016-02-08 DIAGNOSIS — M1990 Unspecified osteoarthritis, unspecified site: Secondary | ICD-10-CM | POA: Diagnosis not present

## 2016-02-08 NOTE — Telephone Encounter (Signed)
Spoke to patient and he will come in for nurse visit.

## 2016-02-08 NOTE — Telephone Encounter (Signed)
Since he is now seeing blood in his urine, we need to perform a CATH UA and he may need a work up to see where the blood is coming from.

## 2016-02-09 ENCOUNTER — Ambulatory Visit (INDEPENDENT_AMBULATORY_CARE_PROVIDER_SITE_OTHER): Payer: Medicare Other | Admitting: Family Medicine

## 2016-02-09 DIAGNOSIS — N390 Urinary tract infection, site not specified: Secondary | ICD-10-CM | POA: Diagnosis not present

## 2016-02-09 LAB — URINALYSIS, COMPLETE
BILIRUBIN UA: NEGATIVE
GLUCOSE, UA: NEGATIVE
KETONES UA: NEGATIVE
NITRITE UA: POSITIVE — AB
SPEC GRAV UA: 1.02 (ref 1.005–1.030)
UUROB: 0.2 mg/dL (ref 0.2–1.0)
pH, UA: 5.5 (ref 5.0–7.5)

## 2016-02-09 LAB — MICROSCOPIC EXAMINATION
Epithelial Cells (non renal): NONE SEEN /hpf (ref 0–10)
RBC MICROSCOPIC, UA: NONE SEEN /HPF (ref 0–?)

## 2016-02-09 NOTE — Progress Notes (Signed)
In and Out Catheterization  Patient is present today for a I & O catheterization due to hematuria. Patient was cleaned and prepped in a sterile fashion with betadine.  A 14FR cath was inserted no complications were noted , 9ml of urine return was noted, urine was orange in color. A clean urine sample was collected for UA, culture. Bladder was drained  And catheter was removed with out difficulty.    Preformed by: Elberta Leatherwood, CMA

## 2016-02-11 ENCOUNTER — Other Ambulatory Visit: Payer: Self-pay | Admitting: Urology

## 2016-02-11 LAB — CULTURE, URINE COMPREHENSIVE

## 2016-02-11 MED ORDER — AMOXICILLIN-POT CLAVULANATE 875-125 MG PO TABS: 1.0000 | ORAL_TABLET | Freq: Two times a day (BID) | ORAL | 0 refills | Status: DC

## 2016-02-13 ENCOUNTER — Other Ambulatory Visit: Payer: Self-pay | Admitting: *Deleted

## 2016-02-13 ENCOUNTER — Other Ambulatory Visit: Payer: Self-pay | Admitting: Family Medicine

## 2016-02-13 ENCOUNTER — Telehealth: Payer: Self-pay

## 2016-02-13 DIAGNOSIS — R300 Dysuria: Secondary | ICD-10-CM

## 2016-02-13 MED ORDER — SULFAMETHOXAZOLE-TRIMETHOPRIM 800-160 MG PO TABS: 1.0000 | ORAL_TABLET | Freq: Two times a day (BID) | ORAL | 0 refills | Status: DC

## 2016-02-13 MED ORDER — GABAPENTIN 300 MG PO CAPS: 300.0000 mg | ORAL_CAPSULE | Freq: Two times a day (BID) | ORAL | 1 refills | Status: DC

## 2016-02-13 NOTE — Telephone Encounter (Signed)
Spoke with pt daughter, Leveda Anna, in reference to +ucx. Leveda Anna stated that pt has taken Augmentin twice already for E. Coli and wanted to know if there was another abx that could be taken. Please advise.

## 2016-02-13 NOTE — Telephone Encounter (Signed)
I will change the antibiotic as long as they are aware that exposing the patient to different antibiotics raises his risk for antibiotic resistance.    We can call in Septra DS, twice daily for seven days.

## 2016-02-13 NOTE — Telephone Encounter (Signed)
Requesting 90 day supply.

## 2016-02-13 NOTE — Telephone Encounter (Signed)
Pt called requesting ucx results. Advised pt that script for Augmentin had been called to his pharmacy & daughter was notified. Advised that daughter asked about changing medication & message sent to [facility]. Pt requests that he be called with further information.

## 2016-02-13 NOTE — Telephone Encounter (Signed)
-----   Message from Nori Riis, PA-C sent at 02/11/2016  9:45 PM EDT ----- Patient with a positive urine culture. I have sent in a prescription for Augmentin to [place].

## 2016-02-13 NOTE — Telephone Encounter (Signed)
Spoke with pt and made aware new abx were sent to pharmacy.  LMOM for [employer]

## 2016-02-16 DIAGNOSIS — M5416 Radiculopathy, lumbar region: Secondary | ICD-10-CM | POA: Diagnosis not present

## 2016-02-16 DIAGNOSIS — M5136 Other intervertebral disc degeneration, lumbar region: Secondary | ICD-10-CM | POA: Diagnosis not present

## 2016-02-28 ENCOUNTER — Ambulatory Visit
Admission: RE | Admit: 2016-02-28 | Discharge: 2016-02-28 | Disposition: A | Discharge status: Home / Self Care | Payer: Medicare Other | Source: Ambulatory Visit | Attending: Podiatry | Admitting: Podiatry

## 2016-02-28 ENCOUNTER — Other Ambulatory Visit: Payer: Self-pay | Admitting: Podiatry

## 2016-02-28 DIAGNOSIS — M79604 Pain in right leg: Secondary | ICD-10-CM

## 2016-02-28 DIAGNOSIS — M7989 Other specified soft tissue disorders: Secondary | ICD-10-CM | POA: Diagnosis not present

## 2016-02-28 DIAGNOSIS — M19071 Primary osteoarthritis, right ankle and foot: Secondary | ICD-10-CM | POA: Diagnosis not present

## 2016-03-10 DIAGNOSIS — M1990 Unspecified osteoarthritis, unspecified site: Secondary | ICD-10-CM | POA: Diagnosis not present

## 2016-03-13 ENCOUNTER — Other Ambulatory Visit: Payer: Self-pay | Admitting: Family Medicine

## 2016-03-13 DIAGNOSIS — J209 Acute bronchitis, unspecified: Secondary | ICD-10-CM | POA: Diagnosis not present

## 2016-03-13 DIAGNOSIS — G4733 Obstructive sleep apnea (adult) (pediatric): Secondary | ICD-10-CM | POA: Diagnosis not present

## 2016-03-13 NOTE — Telephone Encounter (Signed)
Last ov 10/10/2015

## 2016-03-22 DIAGNOSIS — M5416 Radiculopathy, lumbar region: Secondary | ICD-10-CM | POA: Diagnosis not present

## 2016-03-22 DIAGNOSIS — M5136 Other intervertebral disc degeneration, lumbar region: Secondary | ICD-10-CM | POA: Diagnosis not present

## 2016-03-23 ENCOUNTER — Other Ambulatory Visit: Payer: Self-pay | Admitting: Family Medicine

## 2016-03-31 ENCOUNTER — Ambulatory Visit (INDEPENDENT_AMBULATORY_CARE_PROVIDER_SITE_OTHER): Payer: Medicare Other

## 2016-03-31 DIAGNOSIS — Z23 Encounter for immunization: Secondary | ICD-10-CM

## 2016-04-04 DIAGNOSIS — I25709 Atherosclerosis of coronary artery bypass graft(s), unspecified, with unspecified angina pectoris: Secondary | ICD-10-CM | POA: Diagnosis not present

## 2016-04-04 DIAGNOSIS — J449 Chronic obstructive pulmonary disease, unspecified: Secondary | ICD-10-CM | POA: Diagnosis not present

## 2016-04-04 DIAGNOSIS — E785 Hyperlipidemia, unspecified: Secondary | ICD-10-CM | POA: Diagnosis not present

## 2016-04-04 DIAGNOSIS — I208 Other forms of angina pectoris: Secondary | ICD-10-CM | POA: Diagnosis not present

## 2016-04-09 DIAGNOSIS — M1990 Unspecified osteoarthritis, unspecified site: Secondary | ICD-10-CM | POA: Diagnosis not present

## 2016-04-26 DIAGNOSIS — Z96641 Presence of right artificial hip joint: Secondary | ICD-10-CM | POA: Diagnosis not present

## 2016-04-26 DIAGNOSIS — M1612 Unilateral primary osteoarthritis, left hip: Secondary | ICD-10-CM | POA: Diagnosis not present

## 2016-04-30 DIAGNOSIS — M1612 Unilateral primary osteoarthritis, left hip: Secondary | ICD-10-CM | POA: Insufficient documentation

## 2016-05-01 DIAGNOSIS — M19071 Primary osteoarthritis, right ankle and foot: Secondary | ICD-10-CM | POA: Diagnosis not present

## 2016-05-01 DIAGNOSIS — M25571 Pain in right ankle and joints of right foot: Secondary | ICD-10-CM | POA: Diagnosis not present

## 2016-05-04 ENCOUNTER — Ambulatory Visit: Payer: Medicare Other | Admitting: Urology

## 2016-05-07 DIAGNOSIS — M5416 Radiculopathy, lumbar region: Secondary | ICD-10-CM | POA: Diagnosis not present

## 2016-05-07 DIAGNOSIS — M48062 Spinal stenosis, lumbar region with neurogenic claudication: Secondary | ICD-10-CM | POA: Diagnosis not present

## 2016-05-07 DIAGNOSIS — M5030 Other cervical disc degeneration, unspecified cervical region: Secondary | ICD-10-CM | POA: Diagnosis not present

## 2016-05-07 DIAGNOSIS — M5136 Other intervertebral disc degeneration, lumbar region: Secondary | ICD-10-CM | POA: Diagnosis not present

## 2016-05-07 DIAGNOSIS — M4802 Spinal stenosis, cervical region: Secondary | ICD-10-CM | POA: Diagnosis not present

## 2016-05-08 ENCOUNTER — Other Ambulatory Visit: Payer: Self-pay | Admitting: Family Medicine

## 2016-05-08 ENCOUNTER — Encounter: Payer: Self-pay | Admitting: Urology

## 2016-05-08 ENCOUNTER — Ambulatory Visit: Payer: Medicare Other | Admitting: Urology

## 2016-05-08 VITALS — BP 160/83 | HR 103 | Ht 61.0 in | Wt 233.9 lb

## 2016-05-08 DIAGNOSIS — R351 Nocturia: Secondary | ICD-10-CM

## 2016-05-08 DIAGNOSIS — I10 Essential (primary) hypertension: Secondary | ICD-10-CM

## 2016-05-08 DIAGNOSIS — N3941 Urge incontinence: Secondary | ICD-10-CM | POA: Diagnosis not present

## 2016-05-08 DIAGNOSIS — R350 Frequency of micturition: Secondary | ICD-10-CM | POA: Diagnosis not present

## 2016-05-08 LAB — BLADDER SCAN AMB NON-IMAGING: SCAN RESULT: 54

## 2016-05-08 MED ORDER — MIRABEGRON ER 50 MG PO TB24: 50.0000 mg | ORAL_TABLET | Freq: Every day | ORAL | 12 refills | Status: DC

## 2016-05-08 NOTE — Progress Notes (Signed)
3:03 PM   Ma Rings. 28-May-1935 ET:[id number]  Referring provider: Birdie Sons, MD [address] [place], [place] [address]  Chief Complaint  Patient presents with  . Urinary Frequency    HPI: Patient is a 81 year old Caucasian male who presents after for a three month follow up after Initiating Myrbetriq 50 mg for urge incontinence and frequency.  Patient was a self-referral for urinary incontinence.  Patient's states that approximately several months ago he started to experience urinary incontinence.   He stated he would feel the urge to void and the urine would just come out.  He had his right foot operated on 10/27/2015, but he stated the symptoms were present prior to that surgery.  He was making 5-6 trips to the bathroom during the day,  5-6 trips to the bathroom during the night and had 8-10 episodes of urge incontinence daily.  He is wearing depends on a continual basis.    His IPSS score is 15, which is moderate LUTS.  He is mostly satisfied with his quality of life at this time.  His PVR today is 54 mL.  His previous IPSS score was 27/6.  His previous PVR was 74 mL.  He is complaining of frequency, urgency and urge incontinence.  He has noticed an improvement in his urinary symptoms with the Myrbetriq and he is becoming more ambulatory at this time.       IPSS    Row Name 05/08/16 1400         International Prostate Symptom Score   How often have you had the sensation of not emptying your bladder? Less than half the time     How often have you had to urinate less than every two hours? More than half the time     How often have you found you stopped and started again several times when you urinated? Less than half the time     How often have you found it difficult to postpone urination? More than half the time     How often have you had a weak urinary stream? Less than half the time     How often have you had to strain to start urination? Not at  All     How many times did you typically get up at night to urinate? 1 Time     Total IPSS Score 15       Quality of Life due to urinary symptoms   If you were to spend the rest of your life with your urinary condition just the way it is now how would you feel about that? Mostly Satisfied        Score:  1-7 Mild 8-19 Moderate 20-35 Severe   PMH: Past Medical History:  Diagnosis Date  . Anemia   . Anginal pain ([place])   . Arthritis   . Bruises easily   . CHF (congestive heart failure) ([place])   . Chronic back pain    stenosis  . COPD (chronic obstructive pulmonary disease) ([place])   . Coronary artery disease   . Diabetes mellitus without complication ([place])    takes Glimepiride daily  . Emphysema   . GERD (gastroesophageal reflux disease)    takes Omeprazole daily  . H/O esophagogastroduodenoscopy   . H/O hiatal hernia   . History of blood transfusion    no reaction noted  . History of bronchitis    last time a couple of yrs  ago  . History of colon polyps   . History of gastric ulcer   . History of MRSA infection   . Hyperlipidemia    takes Simvastin daily  . Hypertension    takes Metoprolol daily and Cardura nightly  . Insomnia    takes Trazodone nightly  . Joint pain   . Joint swelling   . Leg cramps    takes quinine prn  . Liver fatty degeneration   . Melanoma ([place])   . Panic attacks   . Peripheral edema    takes Lasix daily  . Pneumonia    hx of last time a couple of yrs ago  . PONV (postoperative nausea and vomiting)   . Shortness of breath    with exertion  . Sleep apnea    uses CPAP    Surgical History: Past Surgical History:  Procedure Laterality Date  . ANKLE FUSION Right 11/04/2015   Procedure: ARTHRODESIS ANKLE;  Surgeon: Samara Deist, DPM;  Location: [facility];  Service: Podiatry;  Laterality: Right;  . BACK SURGERY     x 4, last lumb. laminectomy Dr. Annette Stable 2008 cervical fusion x 2  . bilateral cataract surgery    . CHOLECYSTECTOMY  1970  .  COLONOSCOPY    . CORONARY ARTERY BYPASS GRAFT  2000    3 vessels  . NECK SURGERY     x 2  . prostate laser surgery     x 2  . REPLACEMENT TOTAL KNEE BILATERAL  '86 and 2000  . REVERSE SHOULDER ARTHROPLASTY  05/30/2012   Procedure: REVERSE SHOULDER ARTHROPLASTY;  Surgeon: Augustin Schooling, MD;  Location: [facility];  Service: Orthopedics;  Laterality: Right;  right reverse shoulder arthroplasty  . TOTAL HIP ARTHROPLASTY Right 2012   [place]    Home Medications:    Medication List       Accurate as of 05/08/16  3:03 PM. Always use your most recent med list.          ACCU-CHEK COMPACT PLUS test strip Generic drug:  glucose blood CHECK SUGAR TWICE DAILY AS DIRECTED   acetaminophen 325 MG tablet Commonly known as:  TYLENOL Take 650 mg by mouth every 6 (six) hours as needed. Reported on 01/13/2016   amoxicillin-clavulanate 875-125 MG tablet Commonly known as:  AUGMENTIN Take 1 tablet by mouth every 12 (twelve) hours.   aspirin 81 MG tablet Take 81 mg by mouth daily.   doxazosin 2 MG tablet Commonly known as:  CARDURA TAKE ONE (1) TABLET BY MOUTH EVERY DAY   enoxaparin 40 MG/0.4ML injection Commonly known as:  LOVENOX Inject 0.4 mLs (40 mg total) into the skin daily.   fluticasone 50 MCG/ACT nasal spray Commonly known as:  FLONASE USE ONE TO TWO SPRAYS IN EACH NOSTRIL EVERY DAY   furosemide 40 MG tablet Commonly known as:  LASIX TAKE ONE (1) TABLET BY MOUTH EVERY DAY   gabapentin 300 MG capsule Commonly known as:  NEURONTIN Take 1 capsule (300 mg total) by mouth 2 (two) times daily. Reported on 09/13/2015   glimepiride 1 MG tablet Commonly known as:  AMARYL TAKE ONE (1) TABLET BY MOUTH EVERY DAY   HYDROcodone-acetaminophen 5-325 MG tablet Commonly known as:  NORCO/VICODIN Take 1 tablet by mouth every 6 (six) hours as needed for moderate pain.   loratadine 10 MG tablet Commonly known as:  CLARITIN Take 10 mg by mouth daily.   meloxicam 15 MG tablet Commonly known  as:  MOBIC TAKE ONE TABLET BY  MOUTH DAILY AS NEEDED   metFORMIN 500 MG 24 hr tablet Commonly known as:  GLUCOPHAGE-XR Take 1 tablet (500 mg total) by mouth daily.   methocarbamol 500 MG tablet Commonly known as:  ROBAXIN Take 1 tablet (500 mg total) by mouth 3 (three) times daily as needed.   metoprolol succinate 25 MG 24 hr tablet Commonly known as:  TOPROL-XL Take by mouth. Reported on 01/13/2016   metoprolol tartrate 25 MG tablet Commonly known as:  LOPRESSOR TAKE ONE (1) TABLET BY MOUTH TWO (2) TIMES DAILY   mirabegron ER 50 MG Tb24 tablet Commonly known as:  MYRBETRIQ Take 1 tablet (50 mg total) by mouth daily.   mirtazapine 7.5 MG tablet Commonly known as:  REMERON Reported on 12/13/2015   multivitamin with minerals Tabs tablet Take 1 tablet by mouth daily.   nystatin powder Commonly known as:  nystatin Apply 1 g topically 2 (two) times daily.   omeprazole 20 MG capsule Commonly known as:  PRILOSEC TAKE ONE (1) CAPSULE BY MOUTH 2 TIMES DAILY   oxybutynin 5 MG 24 hr tablet Commonly known as:  DITROPAN-XL Reported on 01/13/2016   oxyCODONE 5 MG immediate release tablet Commonly known as:  Oxy IR/ROXICODONE Take 1-2 tablets (5-10 mg total) by mouth every 4 (four) hours as needed for moderate pain.   polyethylene glycol packet Commonly known as:  MIRALAX / GLYCOLAX Take 17 g by mouth daily as needed for moderate constipation.   potassium chloride SA 20 MEQ tablet Commonly known as:  K-DUR,KLOR-CON TAKE ONE (1) TABLET BY MOUTH EVERY DAY   quiNINE 324 MG capsule Commonly known as:  QUALAQUIN Take 648 mg by mouth every 8 (eight) hours. Reported on 01/13/2016   senna 8.6 MG Tabs tablet Commonly known as:  SENOKOT Take 1 tablet by mouth.   simvastatin 40 MG tablet Commonly known as:  ZOCOR TAKE ONE TABLET BY MOUTH EVERY NIGHT AT BEDTIME   sulfamethoxazole-trimethoprim 800-160 MG tablet Commonly known as:  BACTRIM DS,SEPTRA DS Take 1 tablet by mouth every 12  (twelve) hours.   traZODone 100 MG tablet Commonly known as:  DESYREL TAKE 1/2 TO 1 TABLET BY MOUTH AT BEDTIMEAS NEEDED FOR INSOMNIA   zinc oxide 11.3 % Crea cream Commonly known as:  BALMEX Apply 1 application topically 2 (two) times daily.       Allergies:  Allergies  Allergen Reactions  . Hydrocodone-Acetaminophen Shortness Of Breath  . Propoxyphene Anaphylaxis  . Codeine Sulfate     Patient denies  . Darvon [Propoxyphene Hcl] Other (See Comments)    Short of breath  . Demerol [Meperidine] Other (See Comments)    Short of breath    Family History: Family History  Problem Relation Age of Onset  . Alzheimer's disease Mother   . Heart attack Father   . Bipolar disorder Brother   . Kidney disease Neg Hx   . Prostate cancer Neg Hx     Social History:  reports that he quit smoking about 47 years ago. His smoking use included Cigarettes. He has a 25.00 pack-year smoking history. He has never used smokeless tobacco. He reports that he does not drink alcohol or use drugs.  ROS: UROLOGY Frequent Urination?: Yes Hard to postpone urination?: Yes Burning/pain with urination?: No Get up at night to urinate?: No Leakage of urine?: Yes Urine stream starts and stops?: No Trouble starting stream?: No Do you have to strain to urinate?: No Blood in urine?: No Urinary tract infection?: No Sexually transmitted disease?:  No Injury to kidneys or bladder?: No Painful intercourse?: No Weak stream?: No Erection problems?: No Penile pain?: No  Gastrointestinal Nausea?: No Vomiting?: No Indigestion/heartburn?: No Diarrhea?: No Constipation?: No  Constitutional Fever: No Night sweats?: No Weight loss?: No Fatigue?: No  Skin Skin rash/lesions?: No Itching?: No  Eyes Blurred vision?: No Double vision?: No  Ears/Nose/Throat Sore throat?: No Sinus problems?: No  Hematologic/Lymphatic Swollen glands?: No Easy bruising?: No  Cardiovascular Leg swelling?:  No Chest pain?: No  Respiratory Cough?: No Shortness of breath?: Yes  Endocrine Excessive thirst?: No  Musculoskeletal Back pain?: Yes Joint pain?: No  Neurological Headaches?: No Dizziness?: No  Psychologic Depression?: No Anxiety?: No  Physical Exam: BP (!) 160/83 (BP Location: Left Arm, Patient Position: Sitting, Cuff Size: Large)   Pulse (!) 103   Ht 5\' 1"  (1.549 m)   Wt 233 lb 14.4 oz (106.1 kg)   BMI 44.20 kg/m   Constitutional: Well nourished. Alert and oriented, No acute distress. HEENT: [place] AT, moist mucus membranes. Trachea midline, no masses. Cardiovascular: No clubbing, cyanosis, or edema. Respiratory: Normal respiratory effort, no increased work of breathing. GI: Abdomen is soft, non tender, non distended, no abdominal masses. Obese. Liver and spleen not palpable.  No hernias appreciated.  Stool sample for occult testing is not indicated.   GU: No CVA tenderness.  No bladder fullness or masses.  Patient unable to stand for exam.   Rectal: Deferred.  Skin: No rashes, bruises or suspicious lesions. Lymph: No cervical or inguinal adenopathy. Neurologic: Grossly intact, no focal deficits, moving all 4 extremities. Psychiatric: Normal mood and affect.  Laboratory Data: Lab Results  Component Value Date   WBC 8.1 11/07/2015   HGB 10.4 (L) 11/07/2015   HCT 30.4 (L) 11/07/2015   MCV 91.7 11/07/2015   PLT 119 (L) 11/07/2015    Lab Results  Component Value Date   CREATININE 1.08 11/07/2015    Lab Results  Component Value Date   HGBA1C 6.9 (H) 11/04/2015      Component Value Date/Time   CHOL 144 10/10/2015 0916   HDL 54 10/10/2015 0916   CHOLHDL 2.7 10/10/2015 0916   LDLCALC 63 10/10/2015 0916    Lab Results  Component Value Date   AST 29 10/10/2015   Lab Results  Component Value Date   ALT 41 10/10/2015    Assessment & Plan:    1. Urge incontinence  - Continue Myrbetriq 50 mg daily; refills given  - Return to clinic in 1 year for I  PSS, PVR and exam  2. Frequency:  See above.  3. Nocturia  - Patient has sleep apnea and sleeps with a CPAP machine, nocturia down to once nightly   Return in about 1 year (around 05/08/2017) for IPSS, PVR and exam.  These notes generated with voice recognition software. I apologize for typographical errors.  Zara Council, [facility] [address], [place], [place] [address] [phone]

## 2016-05-10 ENCOUNTER — Encounter: Payer: Self-pay | Admitting: Family Medicine

## 2016-05-10 ENCOUNTER — Telehealth: Payer: Self-pay | Admitting: Family Medicine

## 2016-05-10 ENCOUNTER — Ambulatory Visit (INDEPENDENT_AMBULATORY_CARE_PROVIDER_SITE_OTHER): Payer: Medicare Other | Admitting: Family Medicine

## 2016-05-10 VITALS — BP 132/78 | HR 72 | Temp 99.0°F | Resp 16 | Wt 239.0 lb

## 2016-05-10 DIAGNOSIS — E7800 Pure hypercholesterolemia, unspecified: Secondary | ICD-10-CM | POA: Diagnosis not present

## 2016-05-10 DIAGNOSIS — I2510 Atherosclerotic heart disease of native coronary artery without angina pectoris: Secondary | ICD-10-CM

## 2016-05-10 DIAGNOSIS — E1122 Type 2 diabetes mellitus with diabetic chronic kidney disease: Secondary | ICD-10-CM | POA: Diagnosis not present

## 2016-05-10 DIAGNOSIS — M7552 Bursitis of left shoulder: Secondary | ICD-10-CM | POA: Diagnosis not present

## 2016-05-10 LAB — POCT GLYCOSYLATED HEMOGLOBIN (HGB A1C)
Est. average glucose Bld gHb Est-mCnc: 194
Hemoglobin A1C: 8.4

## 2016-05-10 MED ORDER — EMPAGLIFLOZIN 10 MG PO TABS: 10.0000 mg | ORAL_TABLET | Freq: Every day | ORAL | 0 refills | Status: DC

## 2016-05-10 MED ORDER — METHYLPREDNISOLONE ACETATE 80 MG/ML IJ SUSP: 80.0000 mg | Freq: Once | INTRAMUSCULAR | Status: DC

## 2016-05-10 NOTE — Progress Notes (Signed)
Patient: Mark Terry. Male    DOB: 05-08-1935   81 y.o.   MRN: [id number] Visit Date: 05/10/2016  Today's Provider: [name], MD   Chief Complaint  Patient presents with  . Diabetes    follow up  . Hypertension    follow up  . Hyperlipidemia    follow up  . Chronic Kidney Disease    follow up   Subjective:    HPI  Diabetes Mellitus Type II, Follow-up:   Lab Results  Component Value Date   HGBA1C 6.9 (H) 11/04/2015   HGBA1C 7.0 (H) 10/10/2015   HGBA1C 7.2 05/19/2015   Last seen for diabetes 7 months ago.  Management since then includes no changes. He reports good compliance with treatment. He is not having side effects.  Current symptoms include none and have been stable. Home blood sugar records: fasting range: close to 200  Episodes of hypoglycemia? no   Current Insulin Regimen: none Most Recent Eye Exam: <1  Year ago Weight trend: fluctuating a bit Prior visit with dietician: no Current diet: well balanced Current exercise: sitting exercises  ------------------------------------------------------------------------   Hypertension, follow-up:  BP Readings from Last 3 Encounters:  05/08/16 (!) 160/83  02/03/16 123/74  01/17/16 120/70    He was last seen for hypertension 7 months ago.  BP at that visit was 130/68. Management since that visit includes no changes.He reports good compliance with treatment. He is not having side effects.  He is exercising. He is not adherent to low salt diet.   Outside blood pressures are 130/80.  Patient denies dyspnea.   Cardiovascular risk factors include advanced age (older than 70 for men, 69 for women), diabetes mellitus, dyslipidemia, hypertension and male gender.  Use of agents associated with hypertension: NSAIDS.   ------------------------------------------------------------------------    Lipid/Cholesterol, Follow-up:   Last seen for this 7 months ago.  Management since that visit  includes no changes.  Last Lipid Panel:    Component Value Date/Time   CHOL 144 10/10/2015 0916   TRIG 133 10/10/2015 0916   HDL 54 10/10/2015 0916   CHOLHDL 2.7 10/10/2015 0916   LDLCALC 63 10/10/2015 0916    He reports good compliance with treatment. He is not having side effects.   Wt Readings from Last 3 Encounters:  05/08/16 233 lb 14.4 oz (106.1 kg)  02/03/16 272 lb 14.4 oz (123.8 kg)  01/17/16 200 lb (90.7 kg)    ------------------------------------------------------------------------ Follow up CKD: Patient was last seen for this problem 7 months ago. During that visit, labs were checked showing kidney functions had declined a bit.  BMET    Component Value Date/Time   NA 138 11/07/2015 0341   NA 144 10/10/2015 0916   K 4.4 11/07/2015 0341   CL 105 11/07/2015 0341   CO2 25 11/07/2015 0341   GLUCOSE 139 (H) 11/07/2015 0341   BUN 21 (H) 11/07/2015 0341   BUN 21 10/10/2015 0916   CREATININE 1.08 11/07/2015 0341   CALCIUM 9.1 11/07/2015 0341   GFRNONAA >60 11/07/2015 0341   GFRAA >60 11/07/2015 0341   Left shoulder pain  He has history of bursitis in left shoulder and reports pain has returned over the last couple of months. He has trouble using and raising left arm. He has responded well to steroid injection in the past whch he requests today.     Allergies  Allergen Reactions  . Hydrocodone-Acetaminophen Shortness Of Breath  . Propoxyphene Anaphylaxis  .  Codeine Sulfate     Patient denies  . Darvon [Propoxyphene Hcl] Other (See Comments)    Short of breath  . Demerol [Meperidine] Other (See Comments)    Short of breath     Current Outpatient Prescriptions:  .  ACCU-CHEK COMPACT PLUS test strip, CHECK SUGAR TWICE DAILY AS DIRECTED, Disp: 102 each, Rfl: 4 .  acetaminophen (TYLENOL) 325 MG tablet, Take 650 mg by mouth every 6 (six) hours as needed. Reported on 01/13/2016, Disp: , Rfl:  .  aspirin 81 MG tablet, Take 81 mg by mouth daily., Disp: , Rfl:  .   doxazosin (CARDURA) 2 MG tablet, TAKE ONE (1) TABLET BY MOUTH EVERY DAY, Disp: 30 tablet, Rfl: 5 .  enoxaparin (LOVENOX) 40 MG/0.4ML injection, Inject 0.4 mLs (40 mg total) into the skin daily., Disp: 14 Syringe, Rfl: 0 .  fluticasone (FLONASE) 50 MCG/ACT nasal spray, USE ONE TO TWO SPRAYS IN EACH NOSTRIL EVERY DAY, Disp: 16 g, Rfl: 5 .  furosemide (LASIX) 40 MG tablet, TAKE ONE (1) TABLET BY MOUTH EVERY DAY, Disp: 90 tablet, Rfl: 1 .  gabapentin (NEURONTIN) 300 MG capsule, Take 1 capsule (300 mg total) by mouth 2 (two) times daily. Reported on 09/13/2015, Disp: 180 capsule, Rfl: 1 .  glimepiride (AMARYL) 1 MG tablet, TAKE ONE (1) TABLET BY MOUTH EVERY DAY, Disp: 90 tablet, Rfl: 4 .  HYDROcodone-acetaminophen (NORCO/VICODIN) 5-325 MG tablet, Take 1 tablet by mouth every 6 (six) hours as needed for moderate pain., Disp: 30 tablet, Rfl: 0 .  loratadine (CLARITIN) 10 MG tablet, Take 10 mg by mouth daily., Disp: , Rfl:  .  meloxicam (MOBIC) 15 MG tablet, TAKE ONE TABLET BY MOUTH DAILY AS NEEDED, Disp: 30 tablet, Rfl: 5 .  metFORMIN (GLUCOPHAGE-XR) 500 MG 24 hr tablet, Take 1 tablet (500 mg total) by mouth daily., Disp: 90 tablet, Rfl: 4 .  methocarbamol (ROBAXIN) 500 MG tablet, Take 1 tablet (500 mg total) by mouth 3 (three) times daily as needed., Disp: 60 tablet, Rfl: 1 .  metoprolol succinate (TOPROL-XL) 25 MG 24 hr tablet, Take by mouth. Reported on 01/13/2016, Disp: , Rfl:  .  metoprolol tartrate (LOPRESSOR) 25 MG tablet, TAKE ONE (1) TABLET BY MOUTH TWO (2) TIMES DAILY, Disp: 180 tablet, Rfl: 4 .  mirabegron ER (MYRBETRIQ) 50 MG TB24 tablet, Take 1 tablet (50 mg total) by mouth daily., Disp: 30 tablet, Rfl: 12 .  mirtazapine (REMERON) 7.5 MG tablet, Reported on 12/13/2015, Disp: , Rfl:  .  Multiple Vitamin (MULTIVITAMIN WITH MINERALS) TABS, Take 1 tablet by mouth daily., Disp: , Rfl:  .  nystatin (NYSTATIN) powder, Apply 1 g topically 2 (two) times daily., Disp: 15 g, Rfl: 0 .  omeprazole (PRILOSEC) 20  MG capsule, TAKE ONE (1) CAPSULE BY MOUTH 2 TIMES DAILY, Disp: 180 capsule, Rfl: 3 .  oxybutynin (DITROPAN-XL) 5 MG 24 hr tablet, Reported on 01/13/2016, Disp: , Rfl:  .  oxyCODONE (OXY IR/ROXICODONE) 5 MG immediate release tablet, Take 1-2 tablets (5-10 mg total) by mouth every 4 (four) hours as needed for moderate pain., Disp: 30 tablet, Rfl: 0 .  polyethylene glycol (MIRALAX / GLYCOLAX) packet, Take 17 g by mouth daily as needed for moderate constipation., Disp: 14 each, Rfl: 0 .  potassium chloride SA (K-DUR,KLOR-CON) 20 MEQ tablet, TAKE ONE (1) TABLET BY MOUTH EVERY DAY, Disp: 90 tablet, Rfl: 1 .  quiNINE (QUALAQUIN) 324 MG capsule, Take 648 mg by mouth every 8 (eight) hours. Reported on 01/13/2016, Disp: ,  Rfl:  .  senna (SENOKOT) 8.6 MG TABS tablet, Take 1 tablet by mouth., Disp: , Rfl:  .  simvastatin (ZOCOR) 40 MG tablet, TAKE ONE TABLET BY MOUTH EVERY NIGHT AT BEDTIME, Disp: 90 tablet, Rfl: 3 .  traZODone (DESYREL) 100 MG tablet, TAKE 1/2 TO 1 TABLET BY MOUTH AT BEDTIMEAS NEEDED FOR INSOMNIA, Disp: 30 tablet, Rfl: 5 .  zinc oxide (BALMEX) 11.3 % CREA cream, Apply 1 application topically 2 (two) times daily., Disp: , Rfl:   Review of Systems  Constitutional: Negative for appetite change, chills and fever.  Respiratory: Positive for shortness of breath. Negative for chest tightness and wheezing.   Cardiovascular: Negative for chest pain and palpitations.  Gastrointestinal: Negative for abdominal pain, nausea and vomiting.  Endocrine: Negative for cold intolerance, heat intolerance, polydipsia, polyphagia and polyuria.  Neurological: Negative for dizziness, light-headedness and headaches.    Social History  Substance Use Topics  . Smoking status: Former Smoker    Packs/day: 1.00    Years: 25.00    Types: Cigarettes    Quit date: 07/09/1968  . Smokeless tobacco: Never Used     Comment: quit at age 54  . Alcohol use No   Objective:   BP 132/78 (BP Location: Right Arm, Patient Position:  Sitting, Cuff Size: Large)   Pulse 72   Temp 99 F (37.2 C) (Oral)   Resp 16   Wt 239 lb (108.4 kg)   SpO2 97% Comment: room air  BMI 45.16 kg/m   Physical Exam  General Appearance:    Alert, cooperative, no distress, obese  Eyes:    PERRL, conjunctiva/corneas clear, EOM's intact       Lungs:     Clear to auscultation bilaterally, respirations unlabored  Heart:    Regular rate and rhythm  Neurologic:   Awake, alert, oriented x 3. No apparent focal neurological           defect.   MS:   Moderated tenderness left posterior should around acromium. Unable to abduct left shoulder > 90 degrees.     Results for orders placed or performed in visit on 05/10/16  POCT HgB A1C  Result Value Ref Range   Hemoglobin A1C 8.4    Est. average glucose Bld gHb Est-mCnc 194        Assessment & Plan:     1. Type 2 diabetes mellitus with chronic kidney disease, without long-term current use of insulin, unspecified CKD stage (HCC) A1c is up today. Discussed options for additional treatment. Considering history of CAD will add 10mg  Jardiance and check electrolytes in 3 weeks.  - POCT HgB A1C - empagliflozin (JARDIANCE) 10 MG TABS tablet; Take 10 mg by mouth daily.  Dispense: 21 tablet; Refill: 0  2. Pure hypercholesterolemia He is tolerating simvastatin well with no adverse effects.    3. Atherosclerosis of native coronary artery of native heart without angina pectoris Asymptomatic. Compliant with medication.  Continue aggressive risk factor modification.  Continue routine follow up Dr. Clayborn Bigness. Add jaradiance.  - empagliflozin (JARDIANCE) 10 MG TABS tablet; Take 10 mg by mouth daily.  Dispense: 21 tablet; Refill: 0  4. Bursitis of left shoulder Prepped posterior left shoulder with isopropyl alcohol and iodine. Using 1 1/2 inche 27 gauge needle injected 1cc 80mg /ml methyl prednisolone into subacromial space with no bleeding and well tolerated by patient.        [name], MD  [facility]

## 2016-05-10 NOTE — Telephone Encounter (Signed)
Pt called to advise of the Rx is is taking by Larene Beach at [facility].   The name of the medication is Myrbetriq 50mg  1 time a day.

## 2016-05-10 NOTE — Telephone Encounter (Signed)
FYI. KW 

## 2016-05-10 NOTE — Patient Instructions (Addendum)
Check Labs (renal panel) the third week of November   Empagliflozin oral tablets What is this medicine? EMPAGLIGLOZIN (EM pa gli FLOE zin) helps to treat type 2 diabetes. It helps to control blood sugar. Treatment is combined with diet and exercise. This medicine may be used for other purposes; ask your health care provider or pharmacist if you have questions. What should I tell my health care provider before I take this medicine? They need to know if you have any of these conditions: -dehydration -diabetic ketoacidosis -diet low in salt -eating less due to illness, surgery, dieting, or any other reason -having surgery -high cholesterol -high levels of potassium in the blood -history of pancreatitis or pancreas problems -history of yeast infection of the penis or vagina -if you often drink alcohol -infections in the bladder, kidneys, or urinary tract -kidney disease -liver disease -low blood pressure -on hemodialysis -problems urinating -type 1 diabetes -uncircumcised male -an unusual or allergic reaction to empagliflozin, other medicines, foods, dyes, or preservatives -pregnant or trying to get pregnant -breast-feeding How should I use this medicine? Take this medicine by mouth with a glass of water. Follow the directions on the prescription label. Take it in the morning, with or without food. Take your dose at the same time each day. Do not take more often than directed. Do not stop taking except on your doctor's advice. Talk to your pediatrician regarding the use of this medicine in children. Special care may be needed. Overdosage: If you think you have taken too much of this medicine contact a poison control center or emergency room at once. NOTE: This medicine is only for you. Do not share this medicine with others. What if I miss a dose? If you miss a dose, take it as soon as you can. If it is almost time for your next dose, take only that dose. Do not take double or extra  doses. What may interact with this medicine? Do not take this medicine with any of the following medications: -gatifloxacin This medicine may also interact with the following medications: -alcohol -certain medicines for blood pressure, heart disease -diuretics This list may not describe all possible interactions. Give your health care provider a list of all the medicines, herbs, non-prescription drugs, or dietary supplements you use. Also tell them if you smoke, drink alcohol, or use illegal drugs. Some items may interact with your medicine. What should I watch for while using this medicine? Visit your doctor or health care professional for regular checks on your progress. This medicine can cause a serious condition in which there is too much acid in the blood. If you develop nausea, vomiting, stomach pain, unusual tiredness, or breathing problems, stop taking this medicine and call your doctor right away. If possible, use a ketone dipstick to check for ketones in your urine. A test called the HbA1C (A1C) will be monitored. This is a simple blood test. It measures your blood sugar control over the last 2 to 3 months. You will receive this test every 3 to 6 months. Learn how to check your blood sugar. Learn the symptoms of low and high blood sugar and how to manage them. Always carry a quick-source of sugar with you in case you have symptoms of low blood sugar. Examples include hard sugar candy or glucose tablets. Make sure others know that you can choke if you eat or drink when you develop serious symptoms of low blood sugar, such as seizures or unconsciousness. They must get medical help  at once. Tell your doctor or health care professional if you have high blood sugar. You might need to change the dose of your medicine. If you are sick or exercising more than usual, you might need to change the dose of your medicine. Do not skip meals. Ask your doctor or health care professional if you should avoid  alcohol. Many nonprescription cough and cold products contain sugar or alcohol. These can affect blood sugar. Wear a medical ID bracelet or chain, and carry a card that describes your disease and details of your medicine and dosage times. What side effects may I notice from receiving this medicine? Side effects that you should report to your doctor or health care professional as soon as possible: -allergic reactions like skin rash, itching or hives, swelling of the face, lips, or tongue -breathing problems -dizziness -fast or irregular heartbeat -feeling faint or lightheaded, falls -muscle weakness -nausea, vomiting, unusual stomach upset or pain -signs and symptoms of low blood sugar such as feeling anxious, confusion, dizziness, increased hunger, unusually weak or tired, sweating, shakiness, cold, irritable, headache, blurred vision, fast heartbeat, loss of consciousness -signs and symptoms of a urinary tract infection, such as fever, chills, a burning feeling when urinating, blood in the urine, back pain -trouble passing urine or change in the amount of urine, including an urgent need to urinate more often, in larger amounts, or at night -penile discharge, itching, or pain in men -unusual tiredness -vaginal discharge, itching, or odor in women Side effects that usually do not require medical attention (Report these to your doctor or health care professional if they continue or are bothersome.): -joint pain -mild increase in urination -thirsty This list may not describe all possible side effects. Call your doctor for medical advice about side effects. You may report side effects to FDA at 1-800-FDA-1088. Where should I keep my medicine? Keep out of the reach of children. Store at room temperature between 20 and 25 degrees C (68 and 77 degrees F). Throw away any unused medicine after the expiration date. NOTE: This sheet is a summary. It may not cover all possible information. If you have  questions about this medicine, talk to your doctor, pharmacist, or health care provider.    2016, Elsevier/Gold Standard. (2014-06-15 11:37:10)

## 2016-05-29 ENCOUNTER — Telehealth: Payer: Self-pay | Admitting: Family Medicine

## 2016-05-29 DIAGNOSIS — E1122 Type 2 diabetes mellitus with diabetic chronic kidney disease: Secondary | ICD-10-CM

## 2016-05-29 NOTE — Telephone Encounter (Signed)
Can have one box of Jardiance 10mg  samples. Needs order for renal panel. Thanks.

## 2016-05-29 NOTE — Telephone Encounter (Signed)
Please review. Thanks!  

## 2016-05-29 NOTE — Telephone Encounter (Signed)
Pt is requesting to pick up another sample of empagliflozin (JARDIANCE) 10 MG TABS tablet and pick up a lab slip tomorrow. Pt stated that he was supposed to take the medication for a week and have his labs done. Pt's last OV was 05/10/16. Please advise. Thanks TNP

## 2016-05-29 NOTE — Telephone Encounter (Signed)
Patient has been advised, sample and lab slip have been left at the front desk.KW

## 2016-05-30 DIAGNOSIS — E1122 Type 2 diabetes mellitus with diabetic chronic kidney disease: Secondary | ICD-10-CM | POA: Diagnosis not present

## 2016-05-31 LAB — RENAL FUNCTION PANEL
ALBUMIN: 4.2 g/dL (ref 3.5–4.8)
BUN/Creatinine Ratio: 17 (ref 10–24)
BUN: 28 mg/dL — ABNORMAL HIGH (ref 8–27)
CALCIUM: 9.6 mg/dL (ref 8.6–10.2)
CHLORIDE: 100 mmol/L (ref 96–106)
CO2: 23 mmol/L (ref 18–29)
Creatinine, Ser: 1.69 mg/dL — ABNORMAL HIGH (ref 0.76–1.27)
GFR calc Af Amer: 44 mL/min/{1.73_m2} — ABNORMAL LOW (ref >59)
GFR calc non Af Amer: 38 mL/min/{1.73_m2} — ABNORMAL LOW (ref >59)
GLUCOSE: 191 mg/dL — AB (ref 65–99)
PHOSPHORUS: 3.7 mg/dL (ref 2.5–4.5)
Potassium: 4.3 mmol/L (ref 3.5–5.2)
Sodium: 141 mmol/L (ref 134–144)

## 2016-06-04 ENCOUNTER — Telehealth: Payer: Self-pay

## 2016-06-04 DIAGNOSIS — E1122 Type 2 diabetes mellitus with diabetic chronic kidney disease: Secondary | ICD-10-CM

## 2016-06-04 DIAGNOSIS — I2510 Atherosclerotic heart disease of native coronary artery without angina pectoris: Secondary | ICD-10-CM

## 2016-06-04 MED ORDER — EMPAGLIFLOZIN 10 MG PO TABS: 10.0000 mg | ORAL_TABLET | Freq: Every day | ORAL | 3 refills | Status: DC

## 2016-06-04 NOTE — Telephone Encounter (Signed)
-----   Message from Birdie Sons, MD sent at 06/04/2016  9:08 AM EST ----- Slight decline in kidney functions. Need to drink more water. Otherwise normal. Continue 10mg  Jardiance. Can send in rx for #30, rf x3. Schedule follow up o.v. In 3 months.

## 2016-06-04 NOTE — Telephone Encounter (Signed)
Advised patient as below. Medication was sent into the pharmacy. Appt was scheduled.  

## 2016-06-06 ENCOUNTER — Telehealth: Payer: Self-pay | Admitting: Family Medicine

## 2016-06-06 NOTE — Telephone Encounter (Signed)
Pt went to pick    empagliflozin (JARDIANCE) 10 MG TABS tablet   06/04/16 -- Birdie Sons, MD   Take 10 mg by mouth daily.   Pt stated the copay was 186.00 for 30 days.  He wants to know if there is something else he can take.  He said it helped lower his sugar but he cant afford this.  His call back is [phone]  Thanks Con Memos

## 2016-06-06 NOTE — Telephone Encounter (Signed)
Please review and advise. KW 

## 2016-06-06 NOTE — Telephone Encounter (Signed)
Please contact his pharmacy and see if there is a preferred drug on his formulary or a prior authorization for Jardiance.

## 2016-06-07 DIAGNOSIS — M5412 Radiculopathy, cervical region: Secondary | ICD-10-CM | POA: Diagnosis not present

## 2016-06-07 DIAGNOSIS — M4802 Spinal stenosis, cervical region: Secondary | ICD-10-CM | POA: Diagnosis not present

## 2016-06-07 DIAGNOSIS — M5030 Other cervical disc degeneration, unspecified cervical region: Secondary | ICD-10-CM | POA: Diagnosis not present

## 2016-06-07 NOTE — Telephone Encounter (Signed)
Spoke to [employer] at [employer]. Clair Gulling stated that Mark Terry is approved by pt's insurance. Clair Gulling said that $186 is pt's co pay and part of deductible. Also patient is almost in the donut hole. Clair Gulling said that we may want to prescribed another medication in a different class. Clair Gulling said he did not know what pt's formulary is.

## 2016-06-07 NOTE — Telephone Encounter (Signed)
Please see if we have enough samples to take one a day until the end of the year... About 6 weeks worth. We might be able to get it covered when the formularies changes after the first of the year.

## 2016-06-08 ENCOUNTER — Emergency Department
Admission: EM | Admit: 2016-06-08 | Discharge: 2016-06-08 | Disposition: A | Discharge status: Home / Self Care | Payer: No Typology Code available for payment source | Attending: Emergency Medicine | Admitting: Emergency Medicine

## 2016-06-08 ENCOUNTER — Encounter: Payer: Self-pay | Admitting: Emergency Medicine

## 2016-06-08 ENCOUNTER — Emergency Department: Payer: No Typology Code available for payment source

## 2016-06-08 DIAGNOSIS — Z955 Presence of coronary angioplasty implant and graft: Secondary | ICD-10-CM | POA: Diagnosis not present

## 2016-06-08 DIAGNOSIS — Y92410 Unspecified street and highway as the place of occurrence of the external cause: Secondary | ICD-10-CM | POA: Insufficient documentation

## 2016-06-08 DIAGNOSIS — Z7982 Long term (current) use of aspirin: Secondary | ICD-10-CM | POA: Insufficient documentation

## 2016-06-08 DIAGNOSIS — Y9389 Activity, other specified: Secondary | ICD-10-CM | POA: Insufficient documentation

## 2016-06-08 DIAGNOSIS — I2510 Atherosclerotic heart disease of native coronary artery without angina pectoris: Secondary | ICD-10-CM | POA: Insufficient documentation

## 2016-06-08 DIAGNOSIS — R1011 Right upper quadrant pain: Secondary | ICD-10-CM | POA: Diagnosis not present

## 2016-06-08 DIAGNOSIS — E119 Type 2 diabetes mellitus without complications: Secondary | ICD-10-CM | POA: Insufficient documentation

## 2016-06-08 DIAGNOSIS — Z79899 Other long term (current) drug therapy: Secondary | ICD-10-CM | POA: Diagnosis not present

## 2016-06-08 DIAGNOSIS — Z7984 Long term (current) use of oral hypoglycemic drugs: Secondary | ICD-10-CM | POA: Diagnosis not present

## 2016-06-08 DIAGNOSIS — Y999 Unspecified external cause status: Secondary | ICD-10-CM | POA: Insufficient documentation

## 2016-06-08 DIAGNOSIS — Z87891 Personal history of nicotine dependence: Secondary | ICD-10-CM | POA: Diagnosis not present

## 2016-06-08 DIAGNOSIS — Z85820 Personal history of malignant melanoma of skin: Secondary | ICD-10-CM | POA: Diagnosis not present

## 2016-06-08 DIAGNOSIS — S20219A Contusion of unspecified front wall of thorax, initial encounter: Secondary | ICD-10-CM | POA: Diagnosis not present

## 2016-06-08 DIAGNOSIS — J449 Chronic obstructive pulmonary disease, unspecified: Secondary | ICD-10-CM | POA: Diagnosis not present

## 2016-06-08 DIAGNOSIS — I509 Heart failure, unspecified: Secondary | ICD-10-CM | POA: Insufficient documentation

## 2016-06-08 DIAGNOSIS — I110 Hypertensive heart disease with heart failure: Secondary | ICD-10-CM | POA: Diagnosis not present

## 2016-06-08 DIAGNOSIS — S20211A Contusion of right front wall of thorax, initial encounter: Secondary | ICD-10-CM | POA: Diagnosis not present

## 2016-06-08 DIAGNOSIS — S299XXA Unspecified injury of thorax, initial encounter: Secondary | ICD-10-CM | POA: Diagnosis not present

## 2016-06-08 DIAGNOSIS — R0781 Pleurodynia: Secondary | ICD-10-CM | POA: Diagnosis not present

## 2016-06-08 NOTE — ED Notes (Signed)
Pt alert and oriented X4, active, cooperative, pt in NAD. RR even and unlabored, color WNL.  Pt informed to return if any life threatening symptoms occur.   
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
## 2016-06-08 NOTE — Telephone Encounter (Signed)
There are no samples available at this time.

## 2016-06-12 ENCOUNTER — Encounter: Payer: Self-pay | Admitting: Urology

## 2016-06-12 ENCOUNTER — Ambulatory Visit: Payer: Medicare Other | Admitting: Urology

## 2016-06-12 VITALS — BP 144/86 | HR 108 | Ht 61.0 in | Wt 231.4 lb

## 2016-06-12 DIAGNOSIS — R351 Nocturia: Secondary | ICD-10-CM

## 2016-06-12 DIAGNOSIS — R310 Gross hematuria: Secondary | ICD-10-CM | POA: Diagnosis not present

## 2016-06-12 DIAGNOSIS — N3941 Urge incontinence: Secondary | ICD-10-CM

## 2016-06-12 DIAGNOSIS — R350 Frequency of micturition: Secondary | ICD-10-CM | POA: Diagnosis not present

## 2016-06-12 LAB — URINALYSIS, COMPLETE
Bilirubin, UA: NEGATIVE
KETONES UA: NEGATIVE
Leukocytes, UA: NEGATIVE
NITRITE UA: NEGATIVE
Protein, UA: NEGATIVE
SPEC GRAV UA: 1.015 (ref 1.005–1.030)
UUROB: 0.2 mg/dL (ref 0.2–1.0)
pH, UA: 5 (ref 5.0–7.5)

## 2016-06-12 LAB — MICROSCOPIC EXAMINATION
Bacteria, UA: NONE SEEN
WBC UA: NONE SEEN /HPF (ref 0–?)

## 2016-06-12 LAB — BLADDER SCAN AMB NON-IMAGING: Scan Result: 38
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
## 2016-06-13 LAB — BUN+CREAT
BUN / CREAT RATIO: 18 (ref 10–24)
BUN: 27 mg/dL (ref 8–27)
Creatinine, Ser: 1.46 mg/dL — ABNORMAL HIGH (ref 0.76–1.27)
GFR, EST AFRICAN AMERICAN: 52 mL/min/{1.73_m2} — AB (ref >59)
GFR, EST NON AFRICAN AMERICAN: 45 mL/min/{1.73_m2} — AB (ref >59)

## 2016-06-13 NOTE — Telephone Encounter (Signed)
Samples were given to pt.

## 2016-06-14 LAB — CULTURE, URINE COMPREHENSIVE

## 2016-06-15 ENCOUNTER — Encounter: Payer: Self-pay | Admitting: Family Medicine

## 2016-06-15 ENCOUNTER — Telehealth: Payer: Self-pay

## 2016-06-15 ENCOUNTER — Ambulatory Visit
Admission: RE | Admit: 2016-06-15 | Discharge: 2016-06-15 | Disposition: A | Discharge status: Home / Self Care | Payer: Medicare Other | Source: Ambulatory Visit | Attending: Family Medicine | Admitting: Family Medicine

## 2016-06-15 ENCOUNTER — Ambulatory Visit (INDEPENDENT_AMBULATORY_CARE_PROVIDER_SITE_OTHER): Payer: Medicare Other | Admitting: Family Medicine

## 2016-06-15 VITALS — BP 120/66 | HR 88 | Temp 97.8°F | Resp 16 | Wt 237.0 lb

## 2016-06-15 DIAGNOSIS — S20211A Contusion of right front wall of thorax, initial encounter: Secondary | ICD-10-CM

## 2016-06-15 DIAGNOSIS — R05 Cough: Secondary | ICD-10-CM

## 2016-06-15 DIAGNOSIS — R310 Gross hematuria: Secondary | ICD-10-CM

## 2016-06-15 DIAGNOSIS — R059 Cough, unspecified: Secondary | ICD-10-CM

## 2016-06-15 DIAGNOSIS — S39012A Strain of muscle, fascia and tendon of lower back, initial encounter: Secondary | ICD-10-CM

## 2016-06-15 DIAGNOSIS — R079 Chest pain, unspecified: Secondary | ICD-10-CM | POA: Insufficient documentation

## 2016-06-15 NOTE — Patient Instructions (Signed)
Go to the [facility] on [address] for Chest Xray  

## 2016-06-15 NOTE — Telephone Encounter (Signed)
-----   Message from Nori Riis, PA-C sent at 06/14/2016  4:48 PM EST ----- Please notify the patient that his urine culture was negative and he needs to have a CT Urogram and cystoscopy.
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
## 2016-06-15 NOTE — Telephone Encounter (Signed)
LMOM

## 2016-06-15 NOTE — Telephone Encounter (Signed)
Spoke with pt in reference to ucx results and CT. Pt voiced understanding. CT orders placed.

## 2016-06-18 ENCOUNTER — Other Ambulatory Visit: Payer: Self-pay | Admitting: Pharmacist

## 2016-06-18 NOTE — Patient Outreach (Addendum)
Outreach call to Grayson regarding his request for follow up from the [facility] Medication Adherence Campaign. Left a HIPAA compliant message on the patient's voicemail.   Harlow Asa, PharmD, [facility] [phone]

## 2016-06-21 DIAGNOSIS — M48062 Spinal stenosis, lumbar region with neurogenic claudication: Secondary | ICD-10-CM | POA: Diagnosis not present

## 2016-06-21 DIAGNOSIS — M5416 Radiculopathy, lumbar region: Secondary | ICD-10-CM | POA: Diagnosis not present

## 2016-06-21 DIAGNOSIS — M5136 Other intervertebral disc degeneration, lumbar region: Secondary | ICD-10-CM | POA: Diagnosis not present

## 2016-06-26 ENCOUNTER — Telehealth: Payer: Self-pay | Admitting: Family Medicine

## 2016-06-26 MED ORDER — EMPAGLIFLOZIN 25 MG PO TABS: 25.0000 mg | ORAL_TABLET | Freq: Every day | ORAL | 0 refills | Status: DC

## 2016-06-26 NOTE — Telephone Encounter (Signed)
Please check with patient regarding Jardiance samples. He should be out or running out by now. He can have samples of 25mg  tablets once per, #35 since copay is so high. Copay should go down after the new year.

## 2016-06-26 NOTE — Telephone Encounter (Signed)
I called patient. He states he has enough pills to get him through 6 more days. I advised him that we have more samples for him and he agreed to pick them up before he run out of medication. Patient reports he is tolerating medication well with no side effects. Samples left up front for pick up.

## 2016-08-03 ENCOUNTER — Other Ambulatory Visit: Payer: Self-pay | Admitting: Family Medicine

## 2016-08-03 NOTE — Telephone Encounter (Signed)
Please review-aa 

## 2016-08-03 NOTE — Telephone Encounter (Signed)
Pt is requesting samples for empagliflozin (JARDIANCE) 25 MG TABS tablet.  CB#[phone]/MW

## 2016-08-03 NOTE — Telephone Encounter (Signed)
Can have two weeks samples if we have any

## 2016-08-03 NOTE — Telephone Encounter (Signed)
Notified pt that we do not have any samples at this tome. Pt will check back next week to see if any has come in.

## 2016-08-04 ENCOUNTER — Other Ambulatory Visit: Payer: Self-pay | Admitting: Family Medicine

## 2016-08-07 ENCOUNTER — Telehealth: Payer: Self-pay | Admitting: Family Medicine

## 2016-08-07 DIAGNOSIS — M5030 Other cervical disc degeneration, unspecified cervical region: Secondary | ICD-10-CM | POA: Diagnosis not present

## 2016-08-07 DIAGNOSIS — M4802 Spinal stenosis, cervical region: Secondary | ICD-10-CM | POA: Diagnosis not present

## 2016-08-07 DIAGNOSIS — M5136 Other intervertebral disc degeneration, lumbar region: Secondary | ICD-10-CM | POA: Diagnosis not present

## 2016-08-07 DIAGNOSIS — M48062 Spinal stenosis, lumbar region with neurogenic claudication: Secondary | ICD-10-CM | POA: Diagnosis not present

## 2016-08-07 DIAGNOSIS — M5416 Radiculopathy, lumbar region: Secondary | ICD-10-CM | POA: Diagnosis not present

## 2016-08-07 NOTE — Telephone Encounter (Signed)
He can have 4 boxes of the Jardiance 10mg , and take 2 a day.

## 2016-08-07 NOTE — Telephone Encounter (Signed)
Please advise 

## 2016-08-07 NOTE — Telephone Encounter (Signed)
Patient came in to see if we had any samples of empagliflozin (JARDIANCE) 25 MG TABS tablet.  We do not have samples at this time.  The patient states that he is out of this medication and that it is too expensive and he just can not get it.  Is it okay for him to be out of this medication until we get samples or does he need to be prescribed something different.  He states if he does not get samples that he cannot take it due to the cost.  Please advise.

## 2016-08-07 NOTE — Telephone Encounter (Signed)
Samples are at front desk. Patient was notified.

## 2016-08-08 ENCOUNTER — Other Ambulatory Visit: Payer: Self-pay | Admitting: *Deleted

## 2016-08-08 MED ORDER — MELOXICAM 15 MG PO TABS: 15.0000 mg | ORAL_TABLET | Freq: Every day | ORAL | 5 refills | Status: DC | PRN

## 2016-09-03 ENCOUNTER — Ambulatory Visit (INDEPENDENT_AMBULATORY_CARE_PROVIDER_SITE_OTHER): Payer: Medicare Other | Admitting: Family Medicine

## 2016-09-03 ENCOUNTER — Encounter: Payer: Self-pay | Admitting: Family Medicine

## 2016-09-03 VITALS — BP 116/68 | HR 75 | Temp 98.1°F | Resp 18 | Wt 241.0 lb

## 2016-09-03 DIAGNOSIS — N183 Chronic kidney disease, stage 3 unspecified: Secondary | ICD-10-CM

## 2016-09-03 DIAGNOSIS — R5383 Other fatigue: Secondary | ICD-10-CM

## 2016-09-03 DIAGNOSIS — E7800 Pure hypercholesterolemia, unspecified: Secondary | ICD-10-CM

## 2016-09-03 DIAGNOSIS — E1122 Type 2 diabetes mellitus with diabetic chronic kidney disease: Secondary | ICD-10-CM | POA: Diagnosis not present

## 2016-09-03 DIAGNOSIS — I2510 Atherosclerotic heart disease of native coronary artery without angina pectoris: Secondary | ICD-10-CM

## 2016-09-03 DIAGNOSIS — I10 Essential (primary) hypertension: Secondary | ICD-10-CM | POA: Diagnosis not present

## 2016-09-03 LAB — POCT GLYCOSYLATED HEMOGLOBIN (HGB A1C)
ESTIMATED AVERAGE GLUCOSE: 180
HEMOGLOBIN A1C: 7.9

## 2016-09-03 NOTE — Progress Notes (Signed)
Patient: Mark Terry. Male    DOB: 1934-10-13   82 y.o.   MRN: [id number] Visit Date: 09/03/2016  Today's Provider: [name], MD   Chief Complaint  Patient presents with  . Diabetes    follow up  . Hyperlipidemia    follow up  . Hypertension    follow up   Subjective:    HPI  Diabetes Mellitus Type II, Follow-up:   Lab Results  Component Value Date   HGBA1C 8.4 05/10/2016   HGBA1C 6.9 (H) 11/04/2015   HGBA1C 7.0 (H) 10/10/2015   Last seen for diabetes 3 months ago.  Management since then includes adding Jardiance 10mg . He reports good compliance with treatment. He is not having side effects.  Current symptoms include paresthesia of the feet and have been stable. Home blood sugar records: fasting range: 150 or higher  Episodes of hypoglycemia? no   Current Insulin Regimen: none Most Recent Eye Exam: < 1 year ago Weight trend: increasing steadily Prior visit with dietician: no Current diet: in general, a "healthy" diet   Current exercise: stationary equipment at the gym  ------------------------------------------------------------------------   Hypertension, follow-up:  BP Readings from Last 3 Encounters:  06/15/16 120/66  06/12/16 (!) 144/86  06/08/16 138/72    He was last seen for hypertension 10 months ago.  BP at that visit was 130/68. Management since that visit includes no changes.He reports good compliance with treatment. He is not having side effects.  He is exercising. He is adherent to low salt diet.   Outside blood pressures are 128/70. He is experiencing lower extremity edema.  Patient denies chest pain, chest pressure/discomfort, claudication, dyspnea, exertional chest pressure/discomfort, fatigue, irregular heart beat, near-syncope, orthopnea, palpitations, paroxysmal nocturnal dyspnea, syncope and tachypnea.   Cardiovascular risk factors include advanced age (older than 32 for men, 21 for women), diabetes mellitus,  dyslipidemia, hypertension and male gender.  Use of agents associated with hypertension: NSAIDS.   ------------------------------------------------------------------------    Lipid/Cholesterol, Follow-up:   Last seen for this 3 months ago.  Management since that visit includes no changes.  Last Lipid Panel:    Component Value Date/Time   CHOL 144 10/10/2015 0916   TRIG 133 10/10/2015 0916   HDL 54 10/10/2015 0916   CHOLHDL 2.7 10/10/2015 0916   LDLCALC 63 10/10/2015 0916    He reports good compliance with treatment. He is not having side effects.   Wt Readings from Last 3 Encounters:  06/15/16 237 lb (107.5 kg)  06/12/16 231 lb 6.4 oz (105 kg)  06/08/16 230 lb (104.3 kg)    ------------------------------------------------------------------------  Reports he has been getting around better, and not had any falls in the last couple of months. Having no ankle pain since surgery, but has had worsening neck pain. Is seeing Dr. Sharlet Salina for injections which are helping.     Allergies  Allergen Reactions  . Propoxyphene Anaphylaxis  . Codeine Sulfate     Patient denies  . Darvon [Propoxyphene Hcl] Other (See Comments)    Short of breath  . Demerol [Meperidine] Other (See Comments)    Short of breath  . Oxycodone Nausea Only     Current Outpatient Prescriptions:  .  ACCU-CHEK COMPACT PLUS test strip, CHECK SUGAR TWICE DAILY AS DIRECTED, Disp: 102 each, Rfl: 4 .  acetaminophen (TYLENOL) 325 MG tablet, Take 650 mg by mouth every 6 (six) hours as needed. Reported on 01/13/2016, Disp: , Rfl:  .  aspirin 81 MG  tablet, Take 81 mg by mouth daily., Disp: , Rfl:  .  doxazosin (CARDURA) 2 MG tablet, TAKE ONE (1) TABLET BY MOUTH EVERY DAY, Disp: 30 tablet, Rfl: 5 .  empagliflozin (JARDIANCE) 25 MG TABS tablet, Take 25 mg by mouth daily., Disp: 35 tablet, Rfl: 0 .  enoxaparin (LOVENOX) 40 MG/0.4ML injection, Inject 0.4 mLs (40 mg total) into the skin daily., Disp: 14 Syringe, Rfl: 0 .   fluticasone (FLONASE) 50 MCG/ACT nasal spray, USE ONE TO TWO SPRAYS IN EACH NOSTRIL EVERY DAY, Disp: 16 g, Rfl: 3 .  furosemide (LASIX) 40 MG tablet, TAKE ONE (1) TABLET BY MOUTH EVERY DAY, Disp: 90 tablet, Rfl: 1 .  gabapentin (NEURONTIN) 300 MG capsule, TAKE ONE CAPSULE BY MOUTH TWICE A DAY, Disp: 180 capsule, Rfl: 3 .  glimepiride (AMARYL) 1 MG tablet, TAKE ONE (1) TABLET BY MOUTH EVERY DAY, Disp: 90 tablet, Rfl: 4 .  HYDROcodone-acetaminophen (NORCO/VICODIN) 5-325 MG tablet, Take 1 tablet by mouth every 6 (six) hours as needed for moderate pain., Disp: 30 tablet, Rfl: 0 .  loratadine (CLARITIN) 10 MG tablet, Take 10 mg by mouth daily., Disp: , Rfl:  .  meloxicam (MOBIC) 15 MG tablet, Take 1 tablet (15 mg total) by mouth daily as needed., Disp: 30 tablet, Rfl: 5 .  metFORMIN (GLUCOPHAGE-XR) 500 MG 24 hr tablet, Take 1 tablet (500 mg total) by mouth daily., Disp: 90 tablet, Rfl: 4 .  methocarbamol (ROBAXIN) 500 MG tablet, Take 1 tablet (500 mg total) by mouth 3 (three) times daily as needed., Disp: 60 tablet, Rfl: 1 .  metoprolol succinate (TOPROL-XL) 25 MG 24 hr tablet, Take by mouth. Reported on 01/13/2016, Disp: , Rfl:  .  metoprolol tartrate (LOPRESSOR) 25 MG tablet, TAKE ONE (1) TABLET BY MOUTH TWO (2) TIMES DAILY, Disp: 180 tablet, Rfl: 4 .  mirabegron ER (MYRBETRIQ) 50 MG TB24 tablet, Take 1 tablet (50 mg total) by mouth daily., Disp: 30 tablet, Rfl: 12 .  mirtazapine (REMERON) 7.5 MG tablet, Reported on 12/13/2015, Disp: , Rfl:  .  Multiple Vitamin (MULTIVITAMIN WITH MINERALS) TABS, Take 1 tablet by mouth daily., Disp: , Rfl:  .  nystatin (NYSTATIN) powder, Apply 1 g topically 2 (two) times daily., Disp: 15 g, Rfl: 0 .  omeprazole (PRILOSEC) 20 MG capsule, TAKE ONE (1) CAPSULE BY MOUTH 2 TIMES DAILY, Disp: 180 capsule, Rfl: 3 .  oxybutynin (DITROPAN-XL) 5 MG 24 hr tablet, Take 5 mg by mouth daily. Reported on 01/13/2016, Disp: , Rfl:  .  oxyCODONE (OXY IR/ROXICODONE) 5 MG immediate release  tablet, Take 1-2 tablets (5-10 mg total) by mouth every 4 (four) hours as needed for moderate pain., Disp: 30 tablet, Rfl: 0 .  polyethylene glycol (MIRALAX / GLYCOLAX) packet, Take 17 g by mouth daily as needed for moderate constipation., Disp: 14 each, Rfl: 0 .  potassium chloride SA (K-DUR,KLOR-CON) 20 MEQ tablet, TAKE ONE (1) TABLET BY MOUTH EVERY DAY, Disp: 90 tablet, Rfl: 3 .  quiNINE (QUALAQUIN) 324 MG capsule, Take 648 mg by mouth every 8 (eight) hours. Reported on 01/13/2016, Disp: , Rfl:  .  senna (SENOKOT) 8.6 MG TABS tablet, Take 1 tablet by mouth daily. , Disp: , Rfl:  .  simvastatin (ZOCOR) 40 MG tablet, TAKE ONE TABLET BY MOUTH EVERY NIGHT AT BEDTIME, Disp: 90 tablet, Rfl: 3 .  traZODone (DESYREL) 100 MG tablet, TAKE 1/2 TO 1 TABLET BY MOUTH AT BEDTIMEAS NEEDED FOR INSOMNIA, Disp: 30 tablet, Rfl: 5  Current Facility-Administered Medications:  .  methylPREDNISolone acetate (DEPO-MEDROL) injection 80 mg, 80 mg, Intramuscular, Once, Birdie Sons, MD  Review of Systems  Constitutional: Negative for appetite change, chills and fever.  Respiratory: Negative for chest tightness, shortness of breath and wheezing.   Cardiovascular: Positive for leg swelling (in right foot). Negative for chest pain and palpitations.  Gastrointestinal: Negative for abdominal pain, nausea and vomiting.  Endocrine: Positive for polydipsia.  Neurological: Positive for numbness.  Psychiatric/Behavioral: Positive for sleep disturbance.    Social History  Substance Use Topics  . Smoking status: Former Smoker    Packs/day: 1.00    Years: 25.00    Types: Cigarettes    Quit date: 07/09/1968  . Smokeless tobacco: Never Used     Comment: quit at age 42  . Alcohol use No   Objective:   BP 116/68 (BP Location: Left Arm, Patient Position: Sitting, Cuff Size: Large)   Pulse 75   Temp 98.1 F (36.7 C) (Oral)   Resp 18   Wt 241 lb (109.3 kg)   SpO2 97% Comment: room air  BMI 45.54 kg/m   Physical  Exam  General Appearance:    Alert, cooperative, no distress, obese  Eyes:    PERRL, conjunctiva/corneas clear, EOM's intact       Lungs:     Clear to auscultation bilaterally, respirations unlabored  Heart:    Regular rate and rhythm  Neurologic:   Awake, alert, oriented x 3. No apparent focal neurological           defect.        Results for orders placed or performed in visit on 09/03/16  POCT HgB A1C  Result Value Ref Range   Hemoglobin A1C 7.9    Est. average glucose Bld gHb Est-mCnc 180        Assessment & Plan:      1. Type 2 diabetes mellitus with chronic kidney disease, without long-term current use of insulin, unspecified CKD stage ([place]) Improving. Continue current medications.   - POCT HgB A1C  2. Chronic kidney disease, stage 3 (moderate) Check kidney functions  3. Essential (primary) hypertension Well controlled.  Continue current medications pending review of labs.   4. Pure hypercholesterolemia He is tolerating simvastatin well with no adverse effects.   - Lipid panel  5. Atherosclerosis of native coronary artery of native heart without angina pectoris Asymptomatic. Compliant with medication.  Continue aggressive risk factor modification.   - Lipid panel  6. Fatigue, unspecified type  - Comprehensive metabolic panel - TSH - CBC       [name], MD  [facility]

## 2016-09-04 LAB — CBC
Hematocrit: 37.3 % — ABNORMAL LOW (ref 37.5–51.0)
Hemoglobin: 12.1 g/dL — ABNORMAL LOW (ref 13.0–17.7)
MCH: 30.2 pg (ref 26.6–33.0)
MCHC: 32.4 g/dL (ref 31.5–35.7)
MCV: 93 fL (ref 79–97)
Platelets: 124 10*3/uL — ABNORMAL LOW (ref 150–379)
RBC: 4.01 x10E6/uL — AB (ref 4.14–5.80)
RDW: 14.5 % (ref 12.3–15.4)
WBC: 5.3 10*3/uL (ref 3.4–10.8)

## 2016-09-04 LAB — COMPREHENSIVE METABOLIC PANEL
A/G RATIO: 1.7 (ref 1.2–2.2)
ALT: 42 IU/L (ref 0–44)
AST: 36 IU/L (ref 0–40)
Albumin: 4.3 g/dL (ref 3.5–4.8)
Alkaline Phosphatase: 74 IU/L (ref 39–117)
BUN/Creatinine Ratio: 14 (ref 10–24)
BUN: 24 mg/dL (ref 8–27)
Bilirubin Total: 0.4 mg/dL (ref 0.0–1.2)
CALCIUM: 9.7 mg/dL (ref 8.6–10.2)
CO2: 24 mmol/L (ref 18–29)
CREATININE: 1.76 mg/dL — AB (ref 0.76–1.27)
Chloride: 102 mmol/L (ref 96–106)
GFR calc Af Amer: 42 mL/min/{1.73_m2} — ABNORMAL LOW (ref >59)
GFR, EST NON AFRICAN AMERICAN: 36 mL/min/{1.73_m2} — AB (ref >59)
GLUCOSE: 156 mg/dL — AB (ref 65–99)
Globulin, Total: 2.6 g/dL (ref 1.5–4.5)
POTASSIUM: 4.5 mmol/L (ref 3.5–5.2)
Sodium: 142 mmol/L (ref 134–144)
Total Protein: 6.9 g/dL (ref 6.0–8.5)

## 2016-09-04 LAB — LIPID PANEL
Chol/HDL Ratio: 3 ratio units (ref 0.0–5.0)
Cholesterol, Total: 140 mg/dL (ref 100–199)
HDL: 46 mg/dL (ref >39)
LDL Calculated: 57 mg/dL (ref 0–99)
Triglycerides: 186 mg/dL — ABNORMAL HIGH (ref 0–149)
VLDL CHOLESTEROL CAL: 37 mg/dL (ref 5–40)

## 2016-09-04 LAB — TSH: TSH: 4.33 u[IU]/mL (ref 0.450–4.500)

## 2016-09-05 DIAGNOSIS — M4802 Spinal stenosis, cervical region: Secondary | ICD-10-CM | POA: Diagnosis not present

## 2016-09-05 DIAGNOSIS — M5030 Other cervical disc degeneration, unspecified cervical region: Secondary | ICD-10-CM | POA: Diagnosis not present

## 2016-09-05 DIAGNOSIS — M5412 Radiculopathy, cervical region: Secondary | ICD-10-CM | POA: Diagnosis not present

## 2016-09-11 DIAGNOSIS — L570 Actinic keratosis: Secondary | ICD-10-CM | POA: Diagnosis not present

## 2016-09-11 DIAGNOSIS — L304 Erythema intertrigo: Secondary | ICD-10-CM | POA: Diagnosis not present

## 2016-09-11 DIAGNOSIS — L853 Xerosis cutis: Secondary | ICD-10-CM | POA: Diagnosis not present

## 2016-09-11 DIAGNOSIS — C4490 Unspecified malignant neoplasm of skin, unspecified: Secondary | ICD-10-CM | POA: Diagnosis not present

## 2016-09-11 DIAGNOSIS — Z85820 Personal history of malignant melanoma of skin: Secondary | ICD-10-CM | POA: Diagnosis not present

## 2016-09-20 ENCOUNTER — Telehealth: Payer: Self-pay | Admitting: Family Medicine

## 2016-09-20 NOTE — Telephone Encounter (Signed)
Called Pt to schedule AWV with [facility] on 6/26 awv then cpe & f/up - knb

## 2016-10-02 ENCOUNTER — Telehealth: Payer: Self-pay | Admitting: Family Medicine

## 2016-10-02 NOTE — Telephone Encounter (Signed)
He can have one month supply if we have them.

## 2016-10-02 NOTE — Telephone Encounter (Signed)
Please advise samples? 

## 2016-10-02 NOTE — Telephone Encounter (Signed)
Pt stated that he can't afford empagliflozin (JARDIANCE) 25 MG TABS tablet and he has been getting samples of the medication. Pt wanted to see if we have samples and if so, can he pick them up. Please advise. Thanks TNP

## 2016-10-03 NOTE — Telephone Encounter (Signed)
Patient was notified. Patient is requesting Mark Terry be changed to another medication. Patient stated that med cost $185 co pay. Please advise?

## 2016-10-03 NOTE — Telephone Encounter (Signed)
He can have 8 sample boxes of Jardiance 10mg  and take two tablets a day if we have them. There are no less expensive alternatives.

## 2016-10-03 NOTE — Telephone Encounter (Signed)
Patient informed that we do not have any samples at this time.  ED

## 2016-10-03 NOTE — Telephone Encounter (Signed)
There are no samples available

## 2016-10-23 ENCOUNTER — Telehealth: Payer: Self-pay | Admitting: Urology

## 2016-10-23 NOTE — Telephone Encounter (Signed)
Spoke with pt and made aware samples will be left up front. Pt voiced understanding.

## 2016-10-23 NOTE — Telephone Encounter (Signed)
Patient wants samples of Myrbetriq 50mg  can you call him if he can have more.  [phone]  Thanks,  Sharyn Lull

## 2016-10-30 ENCOUNTER — Other Ambulatory Visit: Payer: Self-pay | Admitting: Family Medicine

## 2016-10-30 DIAGNOSIS — I10 Essential (primary) hypertension: Secondary | ICD-10-CM

## 2016-11-03 ENCOUNTER — Other Ambulatory Visit: Payer: Self-pay | Admitting: Family Medicine

## 2016-11-05 DIAGNOSIS — M4802 Spinal stenosis, cervical region: Secondary | ICD-10-CM | POA: Diagnosis not present

## 2016-11-05 DIAGNOSIS — M5136 Other intervertebral disc degeneration, lumbar region: Secondary | ICD-10-CM | POA: Diagnosis not present

## 2016-11-05 DIAGNOSIS — M5412 Radiculopathy, cervical region: Secondary | ICD-10-CM | POA: Diagnosis not present

## 2016-11-05 DIAGNOSIS — M48062 Spinal stenosis, lumbar region with neurogenic claudication: Secondary | ICD-10-CM | POA: Diagnosis not present

## 2016-11-05 DIAGNOSIS — M5030 Other cervical disc degeneration, unspecified cervical region: Secondary | ICD-10-CM | POA: Diagnosis not present

## 2016-11-16 DIAGNOSIS — M5416 Radiculopathy, lumbar region: Secondary | ICD-10-CM | POA: Diagnosis not present

## 2016-11-16 DIAGNOSIS — M5136 Other intervertebral disc degeneration, lumbar region: Secondary | ICD-10-CM | POA: Diagnosis not present

## 2016-11-16 DIAGNOSIS — M48062 Spinal stenosis, lumbar region with neurogenic claudication: Secondary | ICD-10-CM | POA: Diagnosis not present

## 2016-12-04 ENCOUNTER — Telehealth: Payer: Self-pay | Admitting: Family Medicine

## 2016-12-04 NOTE — Telephone Encounter (Signed)
He can have four boxes

## 2016-12-04 NOTE — Telephone Encounter (Signed)
Patient was notified.

## 2016-12-04 NOTE — Telephone Encounter (Signed)
Samples at front desk 

## 2016-12-04 NOTE — Telephone Encounter (Signed)
Pt is requesting samples for the Rx empagliflozin (JARDIANCE) 25 MG TABS tablet,  Pt states he can not afford to pay the co-pay for this.  Pt states his blood sugar is running around 180.  Pt is asking if we do not have samples can he something sent to the pharmacy that would be cheaper.  [facility].  CB#[phone]/MW

## 2016-12-04 NOTE — Telephone Encounter (Signed)
Please advise 

## 2016-12-20 ENCOUNTER — Telehealth: Payer: Self-pay | Admitting: Radiology

## 2016-12-20 NOTE — Telephone Encounter (Signed)
Spoke with patient and told him that we would place 1 month of Myrbetriq 50mg  for him to pick up and to speak to his pharmacy for filling his script in the future if prior auth is need pharm may send a request. Patient is in agreement with this plan

## 2016-12-20 NOTE — Telephone Encounter (Signed)
Pt requests Myrbetriq 50mg  samples. States Larene Beach had given him samples previously.

## 2016-12-21 DIAGNOSIS — M4802 Spinal stenosis, cervical region: Secondary | ICD-10-CM | POA: Diagnosis not present

## 2016-12-21 DIAGNOSIS — M5412 Radiculopathy, cervical region: Secondary | ICD-10-CM | POA: Diagnosis not present

## 2016-12-21 DIAGNOSIS — M5030 Other cervical disc degeneration, unspecified cervical region: Secondary | ICD-10-CM | POA: Diagnosis not present

## 2016-12-31 ENCOUNTER — Telehealth: Payer: Self-pay | Admitting: Family Medicine

## 2016-12-31 NOTE — Telephone Encounter (Signed)
Pt called would like to know if we have any samples of jardance.  He can not afford the rx and Dr. Caryn Section has been giving him samples.  Pt's call is  [phone]  Thanks teri

## 2017-01-01 ENCOUNTER — Ambulatory Visit: Payer: Medicare Other | Admitting: Family Medicine

## 2017-01-01 MED ORDER — EMPAGLIFLOZIN 25 MG PO TABS: 25.0000 mg | ORAL_TABLET | Freq: Every day | ORAL | 0 refills | Status: DC

## 2017-01-01 NOTE — Telephone Encounter (Signed)
Samples ready to pick-up. Patient was notified.

## 2017-01-07 ENCOUNTER — Telehealth: Payer: Self-pay | Admitting: Family Medicine

## 2017-01-07 NOTE — Telephone Encounter (Signed)
Please advise 

## 2017-01-07 NOTE — Telephone Encounter (Signed)
Does NOT need to be fasting.

## 2017-01-07 NOTE — Telephone Encounter (Signed)
Pt advised-aa 

## 2017-01-08 ENCOUNTER — Ambulatory Visit (INDEPENDENT_AMBULATORY_CARE_PROVIDER_SITE_OTHER): Payer: Medicare Other | Admitting: Family Medicine

## 2017-01-08 ENCOUNTER — Encounter: Payer: Self-pay | Admitting: Family Medicine

## 2017-01-08 ENCOUNTER — Ambulatory Visit (INDEPENDENT_AMBULATORY_CARE_PROVIDER_SITE_OTHER): Payer: Medicare Other

## 2017-01-08 VITALS — BP 124/68 | HR 92 | Temp 99.1°F | Ht 61.0 in | Wt 242.4 lb

## 2017-01-08 DIAGNOSIS — E1122 Type 2 diabetes mellitus with diabetic chronic kidney disease: Secondary | ICD-10-CM

## 2017-01-08 DIAGNOSIS — I5032 Chronic diastolic (congestive) heart failure: Secondary | ICD-10-CM | POA: Diagnosis not present

## 2017-01-08 DIAGNOSIS — Z0000 Encounter for general adult medical examination without abnormal findings: Secondary | ICD-10-CM | POA: Diagnosis not present

## 2017-01-08 DIAGNOSIS — I10 Essential (primary) hypertension: Secondary | ICD-10-CM

## 2017-01-08 DIAGNOSIS — S90511A Abrasion, right ankle, initial encounter: Secondary | ICD-10-CM | POA: Diagnosis not present

## 2017-01-08 LAB — POCT GLYCOSYLATED HEMOGLOBIN (HGB A1C)
Est. average glucose Bld gHb Est-mCnc: 200
Hemoglobin A1C: 8.6

## 2017-01-08 LAB — POCT UA - MICROALBUMIN: Microalbumin Ur, POC: 20 mg/L

## 2017-01-08 NOTE — Patient Instructions (Signed)
Mr. Mark Terry , Thank you for taking time to come for your Medicare Wellness Visit. I appreciate your ongoing commitment to your health goals. Please review the following plan we discussed and let me know if I can assist you in the future.   Screening recommendations/referrals: Colonoscopy: completed 06/03/13 Recommended yearly ophthalmology/optometry visit for glaucoma screening and checkup Recommended yearly dental visit for hygiene and checkup  Vaccinations: Influenza vaccine: due 03/2017 Pneumococcal vaccine: completed series Tdap vaccine: completed 05/14/12, due 05/2022 Shingles vaccine: completed 07/16/06  Advanced directives: Please bring a copy of your POA (Power of [place]) and/or Living Will to your next appointment.   Conditions/risks identified: Fall risk prevention; Obesity; Recommend increasing water intake to 4-6 glasses a day.   Next appointment: None, need to schedule 1 year AWV.  Preventive Care 37 Years and Older, Male Preventive care refers to lifestyle choices and visits with your health care provider that can promote health and wellness. What does preventive care include?  A yearly physical exam. This is also called an annual well check.  Dental exams once or twice a year.  Routine eye exams. Ask your health care provider how often you should have your eyes checked.  Personal lifestyle choices, including:  Daily care of your teeth and gums.  Regular physical activity.  Eating a healthy diet.  Avoiding tobacco and drug use.  Limiting alcohol use.  Practicing safe sex.  Taking low doses of aspirin every day.  Taking vitamin and mineral supplements as recommended by your health care provider. What happens during an annual well check? The services and screenings done by your health care provider during your annual well check will depend on your age, overall health, lifestyle risk factors, and family history of disease. Counseling  Your health care  provider may ask you questions about your:  Alcohol use.  Tobacco use.  Drug use.  Emotional well-being.  Home and relationship well-being.  Sexual activity.  Eating habits.  History of falls.  Memory and ability to understand (cognition).  Work and work Statistician. Screening  You may have the following tests or measurements:  Height, weight, and BMI.  Blood pressure.  Lipid and cholesterol levels. These may be checked every 5 years, or more frequently if you are over 42 years old.  Skin check.  Lung cancer screening. You may have this screening every year starting at age 53 if you have a 30-pack-year history of smoking and currently smoke or have quit within the past 15 years.  Fecal occult blood test (FOBT) of the stool. You may have this test every year starting at age 32.  Flexible sigmoidoscopy or colonoscopy. You may have a sigmoidoscopy every 5 years or a colonoscopy every 10 years starting at age 17.  Prostate cancer screening. Recommendations will vary depending on your family history and other risks.  Hepatitis C blood test.  Hepatitis B blood test.  Sexually transmitted disease (STD) testing.  Diabetes screening. This is done by checking your blood sugar (glucose) after you have not eaten for a while (fasting). You may have this done every 1-3 years.  Abdominal aortic aneurysm (AAA) screening. You may need this if you are a current or former smoker.  Osteoporosis. You may be screened starting at age 39 if you are at high risk. Talk with your health care provider about your test results, treatment options, and if necessary, the need for more tests. Vaccines  Your health care provider may recommend certain vaccines, such as:  Influenza vaccine.  This is recommended every year.  Tetanus, diphtheria, and acellular pertussis (Tdap, Td) vaccine. You may need a Td booster every 10 years.  Zoster vaccine. You may need this after age 11.  Pneumococcal  13-valent conjugate (PCV13) vaccine. One dose is recommended after age 19.  Pneumococcal polysaccharide (PPSV23) vaccine. One dose is recommended after age 55. Talk to your health care provider about which screenings and vaccines you need and how often you need them. This information is not intended to replace advice given to you by your health care provider. Make sure you discuss any questions you have with your health care provider. Document Released: 07/22/2015 Document Revised: 03/14/2016 Document Reviewed: 04/26/2015 Elsevier Interactive Patient Education  2017 [place] Prevention in the Home Falls can cause injuries. They can happen to people of all ages. There are many things you can do to make your home safe and to help prevent falls. What can I do on the outside of my home?  Regularly fix the edges of walkways and driveways and fix any cracks.  Remove anything that might make you trip as you walk through a door, such as a raised step or threshold.  Trim any bushes or trees on the path to your home.  Use bright outdoor lighting.  Clear any walking paths of anything that might make someone trip, such as rocks or tools.  Regularly check to see if handrails are loose or broken. Make sure that both sides of any steps have handrails.  Any raised decks and porches should have guardrails on the edges.  Have any leaves, snow, or ice cleared regularly.  Use sand or salt on walking paths during winter.  Clean up any spills in your garage right away. This includes oil or grease spills. What can I do in the bathroom?  Use night lights.  Install grab bars by the toilet and in the tub and shower. Do not use towel bars as grab bars.  Use non-skid mats or decals in the tub or shower.  If you need to sit down in the shower, use a plastic, non-slip stool.  Keep the floor dry. Clean up any water that spills on the floor as soon as it happens.  Remove soap buildup in the  tub or shower regularly.  Attach bath mats securely with double-sided non-slip rug tape.  Do not have throw rugs and other things on the floor that can make you trip. What can I do in the bedroom?  Use night lights.  Make sure that you have a light by your bed that is easy to reach.  Do not use any sheets or blankets that are too big for your bed. They should not hang down onto the floor.  Have a firm chair that has side arms. You can use this for support while you get dressed.  Do not have throw rugs and other things on the floor that can make you trip. What can I do in the kitchen?  Clean up any spills right away.  Avoid walking on wet floors.  Keep items that you use a lot in easy-to-reach places.  If you need to reach something above you, use a strong step stool that has a grab bar.  Keep electrical cords out of the way.  Do not use floor polish or wax that makes floors slippery. If you must use wax, use non-skid floor wax.  Do not have throw rugs and other things on the floor that can make  you trip. What can I do with my stairs?  Do not leave any items on the stairs.  Make sure that there are handrails on both sides of the stairs and use them. Fix handrails that are broken or loose. Make sure that handrails are as long as the stairways.  Check any carpeting to make sure that it is firmly attached to the stairs. Fix any carpet that is loose or worn.  Avoid having throw rugs at the top or bottom of the stairs. If you do have throw rugs, attach them to the floor with carpet tape.  Make sure that you have a light switch at the top of the stairs and the bottom of the stairs. If you do not have them, ask someone to add them for you. What else can I do to help prevent falls?  Wear shoes that:  Do not have high heels.  Have rubber bottoms.  Are comfortable and fit you well.  Are closed at the toe. Do not wear sandals.  If you use a stepladder:  Make sure that it  is fully opened. Do not climb a closed stepladder.  Make sure that both sides of the stepladder are locked into place.  Ask someone to hold it for you, if possible.  Clearly mark and make sure that you can see:  Any grab bars or handrails.  First and last steps.  Where the edge of each step is.  Use tools that help you move around (mobility aids) if they are needed. These include:  Canes.  Walkers.  Scooters.  Crutches.  Turn on the lights when you go into a dark area. Replace any light bulbs as soon as they burn out.  Set up your furniture so you have a clear path. Avoid moving your furniture around.  If any of your floors are uneven, fix them.  If there are any pets around you, be aware of where they are.  Review your medicines with your doctor. Some medicines can make you feel dizzy. This can increase your chance of falling. Ask your doctor what other things that you can do to help prevent falls. This information is not intended to replace advice given to you by your health care provider. Make sure you discuss any questions you have with your health care provider. Document Released: 04/21/2009 Document Revised: 12/01/2015 Document Reviewed: 07/30/2014 Elsevier Interactive Patient Education  2017 [employer].

## 2017-01-08 NOTE — Progress Notes (Signed)
Subjective:   Mark Terry. is a 82 y.o. male who presents for Medicare Annual/Subsequent preventive examination.  Review of Systems:  N/A  Cardiac Risk Factors include: advanced age (>82men, >37 women);diabetes mellitus;dyslipidemia;hypertension;male gender;obesity (BMI >30kg/m2)     Objective:    Vitals: BP 124/68 (BP Location: Right Arm)   Pulse 92   Temp 99.1 F (37.3 C) (Oral)   Ht 5\' 1"  (1.549 m)   Wt 242 lb 6.4 oz (110 kg)   BMI 45.80 kg/m   Body mass index is 45.8 kg/m.  Tobacco History  Smoking Status  . Former Smoker  . Packs/day: 1.00  . Years: 25.00  . Types: Cigarettes  . Quit date: 07/09/1968  Smokeless Tobacco  . Never Used    Comment: quit at age 71     Counseling given: Not Answered   Past Medical History:  Diagnosis Date  . Anemia   . Anginal pain ([place])   . Arthritis   . Bruises easily   . CHF (congestive heart failure) ([place])   . Chronic back pain    stenosis  . COPD (chronic obstructive pulmonary disease) ([place])   . Coronary artery disease   . Diabetes mellitus without complication ([place])    takes Glimepiride daily  . Emphysema   . GERD (gastroesophageal reflux disease)    takes Omeprazole daily  . H/O esophagogastroduodenoscopy   . H/O hiatal hernia   . History of blood transfusion    no reaction noted  . History of bronchitis    last time a couple of yrs ago  . History of colon polyps   . History of gastric ulcer   . History of MRSA infection   . Hyperlipidemia    takes Simvastin daily  . Hypertension    takes Metoprolol daily and Cardura nightly  . Insomnia    takes Trazodone nightly  . Joint pain   . Joint swelling   . Leg cramps    takes quinine prn  . Liver fatty degeneration   . Melanoma ([employer])   . Panic attacks   . Peripheral edema    takes Lasix daily  . Pneumonia    hx of last time a couple of yrs ago  . PONV (postoperative nausea and vomiting)   . Shortness of breath    with exertion  . Sleep apnea    uses CPAP   Past Surgical History:  Procedure Laterality Date  . ANKLE FUSION Right 11/04/2015   Procedure: ARTHRODESIS ANKLE;  Surgeon: Samara Deist, DPM;  Location: [facility] ORS;  Service: Podiatry;  Laterality: Right;  . BACK SURGERY     x 4, last lumb. laminectomy Dr. Annette Stable 2008 cervical fusion x 2  . bilateral cataract surgery    . CHOLECYSTECTOMY  1970  . COLONOSCOPY    . CORONARY ARTERY BYPASS GRAFT  2000    3 vessels  . NECK SURGERY     x 2  . prostate laser surgery     x 2  . REPLACEMENT TOTAL KNEE BILATERAL  '86 and 2000  . REVERSE SHOULDER ARTHROPLASTY  05/30/2012   Procedure: REVERSE SHOULDER ARTHROPLASTY;  Surgeon: Augustin Schooling, MD;  Location: [facility];  Service: Orthopedics;  Laterality: Right;  right reverse shoulder arthroplasty  . TOTAL HIP ARTHROPLASTY Right 2012   [place]   Family History  Problem Relation Age of Onset  . Alzheimer's disease Mother   . Heart attack Father   . Bipolar disorder Brother   .  Kidney disease Neg Hx   . Prostate cancer Neg Hx    History  Sexual Activity  . Sexual activity: No    Outpatient Encounter Prescriptions as of 01/08/2017  Medication Sig  . ACCU-CHEK COMPACT PLUS test strip CHECK SUGAR TWICE DAILY AS DIRECTED  . acetaminophen (TYLENOL) 325 MG tablet Take 650 mg by mouth every 6 (six) hours as needed. Reported on 01/13/2016  . aspirin 81 MG tablet Take 81 mg by mouth daily.  Marland Kitchen doxazosin (CARDURA) 2 MG tablet TAKE ONE (1) TABLET BY MOUTH EVERY DAY (Patient taking differently: TAKE ONE (2) TABLET BY MOUTH EVERY DAY)  . empagliflozin (JARDIANCE) 25 MG TABS tablet Take 25 mg by mouth daily. (Patient taking differently: Take 50 mg by mouth daily. )  . fluticasone (FLONASE) 50 MCG/ACT nasal spray USE ONE TO TWO SPRAYS IN EACH NOSTRIL EVERY DAY (Patient taking differently: USE ONE TO TWO SPRAYS IN EACH NOSTRIL EVERY DAY as needed)  . furosemide (LASIX) 40 MG tablet TAKE ONE (1) TABLET BY MOUTH EVERY DAY  . gabapentin (NEURONTIN) 300 MG  capsule TAKE ONE CAPSULE BY MOUTH TWICE A DAY  . glimepiride (AMARYL) 1 MG tablet TAKE ONE (1) TABLET BY MOUTH EVERY DAY  . HYDROcodone-acetaminophen (NORCO/VICODIN) 5-325 MG tablet Take 1 tablet by mouth every 6 (six) hours as needed for moderate pain. (Patient taking differently: Take 1 tablet by mouth every 12 (twelve) hours as needed for moderate pain. )  . loratadine (CLARITIN) 10 MG tablet Take 10 mg by mouth daily.  . meloxicam (MOBIC) 15 MG tablet Take 1 tablet (15 mg total) by mouth daily as needed.  . metFORMIN (GLUCOPHAGE-XR) 500 MG 24 hr tablet Take 1 tablet (500 mg total) by mouth daily.  . metoprolol tartrate (LOPRESSOR) 25 MG tablet TAKE ONE (1) TABLET BY MOUTH TWO (2) TIMES DAILY  . mirabegron ER (MYRBETRIQ) 50 MG TB24 tablet Take 1 tablet (50 mg total) by mouth daily.  . Multiple Vitamin (MULTIVITAMIN WITH MINERALS) TABS Take 1 tablet by mouth daily.  Marland Kitchen nystatin (NYSTATIN) powder Apply 1 g topically 2 (two) times daily. (Patient taking differently: Apply 1 g topically 2 (two) times daily. )  . omeprazole (PRILOSEC) 20 MG capsule TAKE ONE (1) CAPSULE BY MOUTH 2 TIMES DAILY  . potassium chloride SA (K-DUR,KLOR-CON) 20 MEQ tablet TAKE ONE (1) TABLET BY MOUTH EVERY DAY  . quiNINE (QUALAQUIN) 324 MG capsule Take 648 mg by mouth every 8 (eight) hours. Reported on 01/13/2016  . simvastatin (ZOCOR) 40 MG tablet TAKE ONE TABLET BY MOUTH EVERY NIGHT AT BEDTIME  . traZODone (DESYREL) 100 MG tablet TAKE 1/2 TO 1 TABLET BY MOUTH AT BEDTIMEAS NEEDED FOR INSOMNIA  . [DISCONTINUED] oxyCODONE (OXY IR/ROXICODONE) 5 MG immediate release tablet Take 1-2 tablets (5-10 mg total) by mouth every 4 (four) hours as needed for moderate pain.  . [DISCONTINUED] polyethylene glycol (MIRALAX / GLYCOLAX) packet Take 17 g by mouth daily as needed for moderate constipation.  . [DISCONTINUED] senna (SENOKOT) 8.6 MG TABS tablet Take 1 tablet by mouth daily.    Facility-Administered Encounter Medications as of 01/08/2017   Medication  . methylPREDNISolone acetate (DEPO-MEDROL) injection 80 mg    Activities of Daily Living In your present state of health, do you have any difficulty performing the following activities: 01/08/2017  Hearing? Y  Vision? N  Difficulty concentrating or making decisions? Y  Walking or climbing stairs? Y  Dressing or bathing? Y  Doing errands, shopping? N  Preparing Food and eating ?  N  Using the Toilet? N  In the past six months, have you accidently leaked urine? Y  Do you have problems with loss of bowel control? N  Managing your Medications? N  Managing your Finances? N  Housekeeping or managing your Housekeeping? Y  Some recent data might be hidden    Patient Care Team: Birdie Sons, MD as PCP - General (Family Medicine) Yolonda Kida, MD as Consulting Physician (Cardiology) Erby Pian, MD as Referring Physician (Specialist) Sharlet Salina, MD as Referring Physician (Physical Medicine and Rehabilitation) Jannet Mantis, MD as Consulting Physician (Dermatology)   Assessment:     Exercise Activities and Dietary recommendations Current Exercise Habits: Structured exercise class, Type of exercise: Other - see comments (eliptical), Time (Minutes): 30, Frequency (Times/Week): 5, Weekly Exercise (Minutes/Week): 150, Intensity: Mild, Exercise limited by: orthopedic condition(s)  Goals    . Increase water intake          Recommend increasing water intake to 4-6 glasses a day.       Fall Risk Fall Risk  01/08/2017 06/15/2016 05/19/2015 02/16/2015  Falls in the past year? Yes Yes Yes No  Number falls in past yr: 1 2 or more 2 or more -  Injury with Fall? No - No -  Risk Factor Category  High Fall Risk High Fall Risk - -  Follow up Falls prevention discussed Falls prevention discussed Falls evaluation completed -   Depression Screen PHQ 2/9 Scores 01/08/2017 06/15/2016 05/19/2015  PHQ - 2 Score 0 1 0  PHQ- 9 Score - - 3    Cognitive Function       6CIT Screen 01/08/2017  What Year? 0 points  What month? 0 points  What time? 0 points  Count back from 20 0 points  Months in reverse 0 points  Repeat phrase 2 points  Total Score 2    Immunization History  Administered Date(s) Administered  . Influenza, High Dose Seasonal PF 04/23/2015, 03/31/2016  . Influenza-Unspecified 03/09/2014  . Pneumococcal Conjugate-13 03/16/2014  . Pneumococcal Polysaccharide-23 06/12/2000, 01/27/2009  . Td 02/25/2001  . Tdap 05/14/2012  . Zoster 07/16/2006   Screening Tests Health Maintenance  Topic Date Due  . FOOT EXAM  09/05/1944  . URINE MICROALBUMIN  09[birth year]05/1944  . INFLUENZA VACCINE  02/06/2017  . HEMOGLOBIN A1C  03/03/2017  . OPHTHALMOLOGY EXAM  08/09/2017  . TETANUS/TDAP  05/14/2022  . PNA vac Low Risk Adult  Completed      Plan:  I have personally reviewed and addressed the Medicare Annual Wellness questionnaire and have noted the following in the patient's chart:  A. Medical and social history B. Use of alcohol, tobacco or illicit drugs  C. Current medications and supplements D. Functional ability and status E.  Nutritional status F.  Physical activity G. Advance directives H. List of other physicians I.  Hospitalizations, surgeries, and ER visits in previous 12 months J.  [place] such as hearing and vision if needed, cognitive and depression L. Referrals and appointments - none  In addition, I have reviewed and discussed with patient certain preventive protocols, quality metrics, and best practice recommendations. A written personalized care plan for preventive services as well as general preventive health recommendations were provided to patient.  See attached scanned questionnaire for additional information.   Signed,  Fabio Neighbors, LPN Nurse Health Advisor   MD Recommendations: Pt needs a diabetic foot exam and urine microalbumin checked today.

## 2017-01-08 NOTE — Progress Notes (Signed)
Patient: Mark Terry. Male    DOB: [birth year]   82 y.o.   MRN: [id number] Visit Date: 01/08/2017  Today's Provider: [name], MD   Chief Complaint  Patient presents with  . Follow-up  . Diabetes  . Hypertension  . Hyperlipidemia   Subjective:    HPI   Diabetes Mellitus Type II, Follow-up:   Lab Results  Component Value Date   HGBA1C 8.6 01/08/2017   HGBA1C 7.9 09/03/2016   HGBA1C 8.4 05/10/2016   Last seen for diabetes 5 months ago.  Management since then includes; no changes. He reports good compliance with treatment. He is not having side effects. none Current symptoms include none and have been unchanged. Home blood sugar records: fasting range: 170-190  Episodes of hypoglycemia? no   Current Insulin Regimen: n/a Most Recent Eye Exam: [place] Weight trend: stable Prior visit with dietician: no Current diet: well balanced Current exercise: gym  He did have steroid injection in his neck last month and sugar has ----------------------------------------------------------------    Hypertension, follow-up:  BP Readings from Last 3 Encounters:  01/08/17 124/68  09/03/16 116/68  06/15/16 120/66    He was last seen for hypertension 5 months ago.  BP at that visit was 116/68. Management since that visit includes; no changes.He reports good compliance with treatment. He is not having side effects. none He is not exercising.yes/gym He is adherent to low salt diet.   Outside blood pressures are 130/80. He is experiencing none.  Patient denies none.   Cardiovascular risk factors include diabetes mellitus.  Use of agents associated with hypertension: none.   ----------------------------------------------------------------     Lipid/Cholesterol, Follow-up:   Last seen for this 5 months ago.  Management since that visit includes ; no changes.  Last Lipid Panel:    Component Value Date/Time   CHOL 140 09/03/2016 0919    TRIG 186 (H) 09/03/2016 0919   HDL 46 09/03/2016 0919   CHOLHDL 3.0 09/03/2016 0919   LDLCALC 57 09/03/2016 0919    He reports good compliance with treatment. He is not having side effects. none  Wt Readings from Last 3 Encounters:  01/08/17 242 lb 6.4 oz (110 kg)  09/03/16 241 lb (109.3 kg)  06/15/16 237 lb (107.5 kg)    ----------------------------------------------------------------  Chronic kidney disease, stage 3 (moderate) From 09/03/2016-labs checked. Recommended he drink more water.  BMP Latest Ref Rng & Units 09/03/2016 06/12/2016 05/30/2016  Glucose 65 - 99 mg/dL 156(H) - 191(H)  BUN 8 - 27 mg/dL 24 27 28(H)  Creatinine 0.76 - 1.27 mg/dL 1.76(H) 1.46(H) 1.69(H)  BUN/Creat Ratio 10 - 24 14 18 17   Sodium 134 - 144 mmol/L 142 - 141  Potassium 3.5 - 5.2 mmol/L 4.5 - 4.3  Chloride 96 - 106 mmol/L 102 - 100  CO2 18 - 29 mmol/L 24 - 23  Calcium 8.6 - 10.2 mg/dL 9.7 - 9.6      Allergies  Allergen Reactions  . Propoxyphene Anaphylaxis  . Codeine Sulfate     Patient denies  . Darvon [Propoxyphene Hcl] Other (See Comments)    Short of breath  . Demerol [Meperidine] Other (See Comments)    Short of breath  . Oxycodone Nausea Only     Current Outpatient Prescriptions:  .  ACCU-CHEK COMPACT PLUS test strip, CHECK SUGAR TWICE DAILY AS DIRECTED, Disp: 102 each, Rfl: 4 .  acetaminophen (TYLENOL) 325 MG tablet, Take 650 mg by mouth every 6 (  six) hours as needed. Reported on 01/13/2016, Disp: , Rfl:  .  aspirin 81 MG tablet, Take 81 mg by mouth daily., Disp: , Rfl:  .  doxazosin (CARDURA) 2 MG tablet, TAKE ONE (1) TABLET BY MOUTH EVERY DAY (Patient taking differently: TAKE ONE (2) TABLET BY MOUTH EVERY DAY), Disp: 30 tablet, Rfl: 11 .  empagliflozin (JARDIANCE) 25 MG TABS tablet, Take 25 mg by mouth daily. (Patient taking differently: Take 50 mg by mouth daily. ), Disp: 28 tablet, Rfl: 0 .  fluticasone (FLONASE) 50 MCG/ACT nasal spray, USE ONE TO TWO SPRAYS IN EACH NOSTRIL  EVERY DAY (Patient taking differently: USE ONE TO TWO SPRAYS IN EACH NOSTRIL EVERY DAY as needed), Disp: 16 g, Rfl: 3 .  furosemide (LASIX) 40 MG tablet, TAKE ONE (1) TABLET BY MOUTH EVERY DAY, Disp: 90 tablet, Rfl: 1 .  gabapentin (NEURONTIN) 300 MG capsule, TAKE ONE CAPSULE BY MOUTH TWICE A DAY, Disp: 180 capsule, Rfl: 3 .  glimepiride (AMARYL) 1 MG tablet, TAKE ONE (1) TABLET BY MOUTH EVERY DAY, Disp: 90 tablet, Rfl: 4 .  HYDROcodone-acetaminophen (NORCO/VICODIN) 5-325 MG tablet, Take 1 tablet by mouth every 6 (six) hours as needed for moderate pain. (Patient taking differently: Take 1 tablet by mouth every 12 (twelve) hours as needed for moderate pain. ), Disp: 30 tablet, Rfl: 0 .  loratadine (CLARITIN) 10 MG tablet, Take 10 mg by mouth daily., Disp: , Rfl:  .  meloxicam (MOBIC) 15 MG tablet, Take 1 tablet (15 mg total) by mouth daily as needed., Disp: 30 tablet, Rfl: 5 .  metFORMIN (GLUCOPHAGE-XR) 500 MG 24 hr tablet, Take 1 tablet (500 mg total) by mouth daily., Disp: 90 tablet, Rfl: 4 .  metoprolol tartrate (LOPRESSOR) 25 MG tablet, TAKE ONE (1) TABLET BY MOUTH TWO (2) TIMES DAILY, Disp: 180 tablet, Rfl: 4 .  mirabegron ER (MYRBETRIQ) 50 MG TB24 tablet, Take 1 tablet (50 mg total) by mouth daily., Disp: 30 tablet, Rfl: 12 .  Multiple Vitamin (MULTIVITAMIN WITH MINERALS) TABS, Take 1 tablet by mouth daily., Disp: , Rfl:  .  omeprazole (PRILOSEC) 20 MG capsule, TAKE ONE (1) CAPSULE BY MOUTH 2 TIMES DAILY, Disp: 180 capsule, Rfl: 3 .  potassium chloride SA (K-DUR,KLOR-CON) 20 MEQ tablet, TAKE ONE (1) TABLET BY MOUTH EVERY DAY, Disp: 90 tablet, Rfl: 3 .  simvastatin (ZOCOR) 40 MG tablet, TAKE ONE TABLET BY MOUTH EVERY NIGHT AT BEDTIME, Disp: 90 tablet, Rfl: 3 .  traZODone (DESYREL) 100 MG tablet, TAKE 1/2 TO 1 TABLET BY MOUTH AT BEDTIMEAS NEEDED FOR INSOMNIA, Disp: 30 tablet, Rfl: 5  Review of Systems  Constitutional: Positive for fatigue.  HENT: Positive for sinus pressure.   Eyes: Positive  for discharge.  Respiratory: Positive for apnea and shortness of breath.   Cardiovascular: Negative.   Gastrointestinal: Negative.   Endocrine: Positive for cold intolerance, heat intolerance and polyuria.  Genitourinary: Positive for difficulty urinating, enuresis and urgency.  Musculoskeletal: Positive for arthralgias, back pain, joint swelling, neck pain and neck stiffness.  Skin: Negative.   Allergic/Immunologic: Negative.   Neurological: Negative.   Hematological: Bruises/bleeds easily.  Psychiatric/Behavioral: Positive for sleep disturbance.    Social History  Substance Use Topics  . Smoking status: Former Smoker    Packs/day: 1.00    Years: 25.00    Types: Cigarettes    Quit date: 07/09/1968  . Smokeless tobacco: Never Used     Comment: quit at age 50  . Alcohol use No   Objective:  Vital Signs - Last Recorded  Most recent update: 01/08/2017 9:16 AM by Fabio Neighbors, LPN  BP  832/54 (BP Location: Right Arm)     Pulse  92     Temp  99.1 F (37.3 C) (Oral)     Ht  5\' 1"  (1.549 m)     Wt  242 lb 6.4 oz (110 kg)      BMI  45.80 kg/m    BMI and BSA Data   Body Mass Index: 45.80 kg/m Body Surface Area: 2.18 m      Physical Exam  General Appearance:    Alert, cooperative, no distress, obese  Eyes:    PERRL, conjunctiva/corneas clear, EOM's intact       Lungs:     Clear to auscultation bilaterally, respirations unlabored  Heart:    Regular rate and rhythm  Neurologic:   Awake, alert, oriented x 3. No apparent focal neurological           defect.       Diabetic Foot Exam - Simple   Simple Foot Form Diabetic Foot exam was performed with the following findings:  Yes 01/08/2017 10:04 AM  Visual Inspection See comments:  Yes Sensation Testing Intact to touch and monofilament testing bilaterally:  Yes Pulse Check Posterior Tibialis and Dorsalis pulse intact bilaterally:  Yes Comments About 1cm abrasion right lateral foot in surgical scar line.  Capillary refill about 4 seconds bilaterally.       Results for orders placed or performed in visit on 01/08/17  POCT glycosylated hemoglobin (Hb A1C)  Result Value Ref Range   Hemoglobin A1C 8.6    Est. average glucose Bld gHb Est-mCnc 200   POCT UA - Microalbumin  Result Value Ref Range   Microalbumin Ur, POC 20 mg/L       Assessment & Plan:     1. Type 2 diabetes mellitus with chronic kidney disease, without long-term current use of insulin, unspecified CKD stage (HCC) A1c likely up due to recent steroid injection. Continue same medications for now.  - POCT glycosylated hemoglobin (Hb A1C) - POCT UA - Microalbumin  2. Abrasion of ankle without infection, right, initial encounter Applied silvadene dressing today and advised to apply antibiotic with fresh dressing daily and to have his SO inspect lesion daily.   3. Essential (primary) hypertension Well controlled.  Continue current medications.    Return in 4 months (on 05/11/2017) for diabeste.       [name], MD  [facility]

## 2017-01-17 ENCOUNTER — Ambulatory Visit: Payer: Medicare Other | Admitting: Physician Assistant

## 2017-01-17 ENCOUNTER — Encounter: Payer: Self-pay | Admitting: Physician Assistant

## 2017-01-17 ENCOUNTER — Ambulatory Visit (INDEPENDENT_AMBULATORY_CARE_PROVIDER_SITE_OTHER): Payer: Medicare Other | Admitting: Physician Assistant

## 2017-01-17 VITALS — BP 136/76 | HR 84 | Temp 97.7°F | Resp 16 | Wt 242.0 lb

## 2017-01-17 DIAGNOSIS — B351 Tinea unguium: Secondary | ICD-10-CM

## 2017-01-17 DIAGNOSIS — E1122 Type 2 diabetes mellitus with diabetic chronic kidney disease: Secondary | ICD-10-CM

## 2017-01-17 DIAGNOSIS — S99929A Unspecified injury of unspecified foot, initial encounter: Secondary | ICD-10-CM | POA: Diagnosis not present

## 2017-01-17 NOTE — Patient Instructions (Signed)
How to Change Your Dressing A dressing is a material that is placed in and over wounds. A dressing helps your wound to heal by protecting it from:  Bacteria.  Worse injury.  Being too dry or too wet.  What are the risks? The sticky (adhesive) tape that is used with a dressing may make your skin sore or irritated, or it may cause a rash. These are the most common problems. However, more serious problems can develop, such as:  Bleeding.  Infection.  How to change your dressing Getting Ready to Change Your Dressing   Take a shower before you do the first dressing change of the day. If your doctor does not want your wound to get wet and your dressing is not waterproof, you may need to put plastic leak-proof sealing wrap on your dressing to protect it.  If needed, take pain medicine as told by your doctor 30 minutes before you change your dressing.  Set up a clean station for wound care. You will need: ? A plastic trash bag that is open and ready to use. ? Hand sanitizer. ? Wound cleanser or salt-water solution (saline) as told by your doctor. ? New dressing material or bandages. Make sure to open the dressing package so the dressing stays on the inside of the package. You may also need these supplies in your clean station:  A box of vinyl gloves.  Tape.  Skin protectant. This may be a wipe, film, or spray.  Clean or germ-free (sterile) scissors.  A cotton-tipped applicator.  Taking Off Your Old Dressing  Wash your hands with soap and water. Dry your hands with a clean towel. If you cannot use soap and water, use hand sanitizer.  If you are using gloves, put on the gloves before you take off the dressing.  Gently take off any adhesive or tape by pulling it off in the direction of your hair growth. Only touch the outside edges of the dressing.  Take off the dressing. If the dressing sticks to your skin, wet the dressing with a germ-free salt-water solution. This helps it  come off more easily.  Take off any gauze or packing in your wound.  Throw the old dressing supplies into the ready trash bag.  Take off your gloves. To take off each glove, grab the cuff with your other hand and turn the glove inside out. Put the gloves in the trash right away.  Wash your hands with soap and water. Dry your hands with a clean towel. If you cannot use soap and water, use hand sanitizer. Cleaning Your Wound  Follow instructions from your doctor about how to clean your wound. This may include using a salt-water solution or recommended wound cleanser.  Do not use over-the-counter medicated or antiseptic creams, sprays, liquids, or dressings unless your doctor tells you to do that.  Use a clean gauze pad to clean the area fully with the salt-water solution or wound cleanser that your doctor recommends.  Throw the gauze pad into the trash bag.  Wash your hands with soap and water. Dry your hands with a clean towel. If you cannot use soap and water, use hand sanitizer. Putting on the Dressing  If your doctor recommended a skin protectant, put it on the skin around the wound.  Cover the wound with the recommended dressing, such as a nonstick gauze or bandage. Make sure to touch only the outside edges of the dressing. Do not touch the inside of the dressing.    Attach the dressing so all sides stay in place. You may do this with the attached medical adhesive, roll gauze, or tape. If you use tape, do not wrap the tape all the way around your arm or leg.  Take off your gloves. Put them in the trash bag with the old dressing. Tie the bag shut and throw it away.  Wash your hands with soap and water. Dry your hands with a clean towel. If you cannot use soap and water, use hand sanitizer. Get help if:   You have new pain.  You have irritation, a rash, or itching around the wound or dressing.  Changing your dressing is painful.  Changing your dressing causes a lot of  bleeding. Get help right away if:  You have very bad pain.  You have signs of infection, such as: ? More redness, swelling, or pain. ? More fluid or blood. ? Warmth. ? Pus or a bad smell. ? Red streaks leading from wound. ? A fever. This information is not intended to replace advice given to you by your health care provider. Make sure you discuss any questions you have with your health care provider. Document Released: 09/21/2008 Document Revised: 12/01/2015 Document Reviewed: 03/31/2015 Elsevier Interactive Patient Education  2018 [employer]  

## 2017-01-17 NOTE — Progress Notes (Signed)
Patient: Mark Terry. Male    DOB: [birth year]   82 y.o.   MRN: [id number] Visit Date: 01/17/2017  Today's Provider: Mar Daring, PA-C   Chief Complaint  Patient presents with  . Toe Injury   Subjective:    HPI Patient here today C/O of left big toe injury last night on bed rail as he was walking up to his bed with his [place]. Patient reports injury caused a lot of bleeding and is concerned due to being diabetic and taking aspirin. He reports his SO's, Izora Gala, daughter came over to help him cleanse the wound and apply a dressing. He reports the dressing fell off this morning. There was still some oozing of serosanguinous fluid this morning so he put a band-aid over the nail. He recently had a foot exam on 01/08/17 which was normal for monofilament testing. A1c at that time was 8.6.      Allergies  Allergen Reactions  . Propoxyphene Anaphylaxis  . Codeine Sulfate     Patient denies  . Darvon [Propoxyphene Hcl] Other (See Comments)    Short of breath  . Demerol [Meperidine] Other (See Comments)    Short of breath  . Oxycodone Nausea Only     Current Outpatient Prescriptions:  .  ACCU-CHEK COMPACT PLUS test strip, CHECK SUGAR TWICE DAILY AS DIRECTED, Disp: 102 each, Rfl: 4 .  acetaminophen (TYLENOL) 325 MG tablet, Take 650 mg by mouth every 6 (six) hours as needed. Reported on 01/13/2016, Disp: , Rfl:  .  aspirin 81 MG tablet, Take 81 mg by mouth daily., Disp: , Rfl:  .  doxazosin (CARDURA) 2 MG tablet, TAKE ONE (1) TABLET BY MOUTH EVERY DAY (Patient taking differently: TAKE ONE (2) TABLET BY MOUTH EVERY DAY), Disp: 30 tablet, Rfl: 11 .  empagliflozin (JARDIANCE) 25 MG TABS tablet, Take 25 mg by mouth daily. (Patient taking differently: Take 50 mg by mouth daily. ), Disp: 28 tablet, Rfl: 0 .  fluticasone (FLONASE) 50 MCG/ACT nasal spray, USE ONE TO TWO SPRAYS IN EACH NOSTRIL EVERY DAY (Patient taking differently: USE ONE TO TWO SPRAYS IN EACH NOSTRIL EVERY DAY as  needed), Disp: 16 g, Rfl: 3 .  furosemide (LASIX) 40 MG tablet, TAKE ONE (1) TABLET BY MOUTH EVERY DAY, Disp: 90 tablet, Rfl: 1 .  gabapentin (NEURONTIN) 300 MG capsule, TAKE ONE CAPSULE BY MOUTH TWICE A DAY, Disp: 180 capsule, Rfl: 3 .  glimepiride (AMARYL) 1 MG tablet, TAKE ONE (1) TABLET BY MOUTH EVERY DAY, Disp: 90 tablet, Rfl: 4 .  HYDROcodone-acetaminophen (NORCO/VICODIN) 5-325 MG tablet, Take 1 tablet by mouth every 6 (six) hours as needed for moderate pain. (Patient taking differently: Take 1 tablet by mouth every 12 (twelve) hours as needed for moderate pain. ), Disp: 30 tablet, Rfl: 0 .  loratadine (CLARITIN) 10 MG tablet, Take 10 mg by mouth daily., Disp: , Rfl:  .  meloxicam (MOBIC) 15 MG tablet, Take 1 tablet (15 mg total) by mouth daily as needed., Disp: 30 tablet, Rfl: 5 .  metFORMIN (GLUCOPHAGE-XR) 500 MG 24 hr tablet, Take 1 tablet (500 mg total) by mouth daily., Disp: 90 tablet, Rfl: 4 .  metoprolol tartrate (LOPRESSOR) 25 MG tablet, TAKE ONE (1) TABLET BY MOUTH TWO (2) TIMES DAILY, Disp: 180 tablet, Rfl: 4 .  mirabegron ER (MYRBETRIQ) 50 MG TB24 tablet, Take 1 tablet (50 mg total) by mouth daily., Disp: 30 tablet, Rfl: 12 .  Multiple Vitamin (MULTIVITAMIN WITH  MINERALS) TABS, Take 1 tablet by mouth daily., Disp: , Rfl:  .  omeprazole (PRILOSEC) 20 MG capsule, TAKE ONE (1) CAPSULE BY MOUTH 2 TIMES DAILY, Disp: 180 capsule, Rfl: 3 .  potassium chloride SA (K-DUR,KLOR-CON) 20 MEQ tablet, TAKE ONE (1) TABLET BY MOUTH EVERY DAY, Disp: 90 tablet, Rfl: 3 .  simvastatin (ZOCOR) 40 MG tablet, TAKE ONE TABLET BY MOUTH EVERY NIGHT AT BEDTIME, Disp: 90 tablet, Rfl: 3 .  traZODone (DESYREL) 100 MG tablet, TAKE 1/2 TO 1 TABLET BY MOUTH AT BEDTIMEAS NEEDED FOR INSOMNIA, Disp: 30 tablet, Rfl: 5  Review of Systems  Constitutional: Negative.   Respiratory: Negative.   Cardiovascular: Negative.   Skin: Positive for wound.  Neurological: Negative.     Social History  Substance Use Topics  .  Smoking status: Former Smoker    Packs/day: 1.00    Years: 25.00    Types: Cigarettes    Quit date: 07/09/1968  . Smokeless tobacco: Never Used     Comment: quit at age 5  . Alcohol use No   Objective:   BP 136/76 (BP Location: Left Arm, Patient Position: Sitting, Cuff Size: Large)   Pulse 84   Temp 97.7 F (36.5 C) (Oral)   Resp 16   Wt 242 lb (109.8 kg) Comment: per pt  SpO2 98%   BMI 45.73 kg/m  Vitals:   01/17/17 1147  BP: 136/76  Pulse: 84  Resp: 16  Temp: 97.7 F (36.5 C)  TempSrc: Oral  SpO2: 98%  Weight: 242 lb (109.8 kg)     Physical Exam  Constitutional: He appears well-developed and well-nourished. No distress.  Skin: He is not diaphoretic.  Feet were examined bilaterally. Onychomycosis noted of all nails. The lesion noted on the right foot at the anterior portion of the incisional scar on the right lateral ankle is healing well. This was noted on the foot exam from 01/08/17.  The left great toe nail is still attached to the nail bed. The nail is very thick, discolored and uneven. There is a slight laceration at the distal end where the nail has separated slightly from the nail bed, but only minimally.  Vitals reviewed.       Assessment & Plan:     1. Injury of nail bed of toe Nail and left great toe injury were cleaned today in the office of dried blood. Small laceration measuring less than 28mm at tip of toe was cleaned, bacitracin ointment applied and a dry, non adherent dressing was placed to cover the tip of the toe and nail to prevent further injury. They were instructed to cleanse the toe once daily with warm water and soap, pat dry, apply small amount of neosporin, and recover. I will see him next week to re-evaluate. If toe is not healing well, or if signs of infection develop before he is to call. He has been established with Dr. Vickki Muff, Podiatry, and may consider referral back to him for further evaluation if needed.   2. Type 2 diabetes mellitus  with chronic kidney disease, without long-term current use of insulin, unspecified CKD stage ([place]) See above medical treatment plan.  3. Onychomycosis See above medical treatment plan.       Mar Daring, PA-C  [facility]

## 2017-01-24 ENCOUNTER — Encounter: Payer: Self-pay | Admitting: Physician Assistant

## 2017-01-24 ENCOUNTER — Ambulatory Visit (INDEPENDENT_AMBULATORY_CARE_PROVIDER_SITE_OTHER): Payer: Medicare Other | Admitting: Physician Assistant

## 2017-01-24 VITALS — BP 140/80 | HR 74 | Temp 98.0°F | Resp 16

## 2017-01-24 DIAGNOSIS — L03032 Cellulitis of left toe: Secondary | ICD-10-CM | POA: Diagnosis not present

## 2017-01-24 DIAGNOSIS — S99922D Unspecified injury of left foot, subsequent encounter: Secondary | ICD-10-CM

## 2017-01-24 NOTE — Progress Notes (Signed)
Patient: Mark Terry. Male    DOB: 11/28/1934   82 y.o.   MRN: [id number] Visit Date: 01/24/2017  Today's Provider: Mar Daring, PA-C   Chief Complaint  Patient presents with  . Follow-up   Subjective:    HPI  Follow up for toe injury  The patient was last seen for this 1 weeks ago. Changes made at last visit include cleaned and advised to keep monitoring for signs of infections.  He reports excellent compliance with treatment. He feels that condition is Improved. He is not having side effects.   ------------------------------------------------------------------------------------     Allergies  Allergen Reactions  . Propoxyphene Anaphylaxis  . Codeine Sulfate     Patient denies  . Darvon [Propoxyphene Hcl] Other (See Comments)    Short of breath  . Demerol [Meperidine] Other (See Comments)    Short of breath  . Oxycodone Nausea Only     Current Outpatient Prescriptions:  .  ACCU-CHEK COMPACT PLUS test strip, CHECK SUGAR TWICE DAILY AS DIRECTED, Disp: 102 each, Rfl: 4 .  acetaminophen (TYLENOL) 325 MG tablet, Take 650 mg by mouth every 6 (six) hours as needed. Reported on 01/13/2016, Disp: , Rfl:  .  aspirin 81 MG tablet, Take 81 mg by mouth daily., Disp: , Rfl:  .  doxazosin (CARDURA) 2 MG tablet, TAKE ONE (1) TABLET BY MOUTH EVERY DAY (Patient taking differently: TAKE ONE (2) TABLET BY MOUTH EVERY DAY), Disp: 30 tablet, Rfl: 11 .  empagliflozin (JARDIANCE) 25 MG TABS tablet, Take 25 mg by mouth daily. (Patient taking differently: Take 50 mg by mouth daily. ), Disp: 28 tablet, Rfl: 0 .  fluticasone (FLONASE) 50 MCG/ACT nasal spray, USE ONE TO TWO SPRAYS IN EACH NOSTRIL EVERY DAY (Patient taking differently: USE ONE TO TWO SPRAYS IN EACH NOSTRIL EVERY DAY as needed), Disp: 16 g, Rfl: 3 .  furosemide (LASIX) 40 MG tablet, TAKE ONE (1) TABLET BY MOUTH EVERY DAY, Disp: 90 tablet, Rfl: 1 .  gabapentin (NEURONTIN) 300 MG capsule, TAKE ONE CAPSULE BY  MOUTH TWICE A DAY, Disp: 180 capsule, Rfl: 3 .  glimepiride (AMARYL) 1 MG tablet, TAKE ONE (1) TABLET BY MOUTH EVERY DAY, Disp: 90 tablet, Rfl: 4 .  HYDROcodone-acetaminophen (NORCO/VICODIN) 5-325 MG tablet, Take 1 tablet by mouth every 6 (six) hours as needed for moderate pain. (Patient taking differently: Take 1 tablet by mouth every 12 (twelve) hours as needed for moderate pain. ), Disp: 30 tablet, Rfl: 0 .  loratadine (CLARITIN) 10 MG tablet, Take 10 mg by mouth daily., Disp: , Rfl:  .  meloxicam (MOBIC) 15 MG tablet, Take 1 tablet (15 mg total) by mouth daily as needed., Disp: 30 tablet, Rfl: 5 .  metFORMIN (GLUCOPHAGE-XR) 500 MG 24 hr tablet, Take 1 tablet (500 mg total) by mouth daily., Disp: 90 tablet, Rfl: 4 .  metoprolol tartrate (LOPRESSOR) 25 MG tablet, TAKE ONE (1) TABLET BY MOUTH TWO (2) TIMES DAILY, Disp: 180 tablet, Rfl: 4 .  mirabegron ER (MYRBETRIQ) 50 MG TB24 tablet, Take 1 tablet (50 mg total) by mouth daily., Disp: 30 tablet, Rfl: 12 .  Multiple Vitamin (MULTIVITAMIN WITH MINERALS) TABS, Take 1 tablet by mouth daily., Disp: , Rfl:  .  omeprazole (PRILOSEC) 20 MG capsule, TAKE ONE (1) CAPSULE BY MOUTH 2 TIMES DAILY, Disp: 180 capsule, Rfl: 3 .  potassium chloride SA (K-DUR,KLOR-CON) 20 MEQ tablet, TAKE ONE (1) TABLET BY MOUTH EVERY DAY, Disp: 90 tablet, Rfl:  3 .  simvastatin (ZOCOR) 40 MG tablet, TAKE ONE TABLET BY MOUTH EVERY NIGHT AT BEDTIME, Disp: 90 tablet, Rfl: 3 .  traZODone (DESYREL) 100 MG tablet, TAKE 1/2 TO 1 TABLET BY MOUTH AT BEDTIMEAS NEEDED FOR INSOMNIA, Disp: 30 tablet, Rfl: 5  Review of Systems  Constitutional: Negative.   Respiratory: Negative.   Cardiovascular: Negative.   Gastrointestinal: Negative.     Social History  Substance Use Topics  . Smoking status: Former Smoker    Packs/day: 1.00    Years: 25.00    Types: Cigarettes    Quit date: 07/09/1968  . Smokeless tobacco: Never Used     Comment: quit at age 76  . Alcohol use No   Objective:   BP  140/80 (BP Location: Left Arm, Patient Position: Sitting, Cuff Size: Large)   Pulse 74   Temp 98 F (36.7 C) (Oral)   Resp 16   SpO2 97%  Vitals:   01/24/17 0808  BP: 140/80  Pulse: 74  Resp: 16  Temp: 98 F (36.7 C)  TempSrc: Oral  SpO2: 97%     Physical Exam  Constitutional: He appears well-developed and well-nourished. No distress.  Skin:  Left great toe nail thick and yellow. Unattached from the nail bed approx 2/3rds down the nail bed. No erythema, no pain, no tenderness to palpation, no drainage.  Vitals reviewed.       Assessment & Plan:     1. Injury of toenail, left, subsequent encounter Previously seen Dr. Vickki Muff in 04/2016 for his right ankle. Will refer back to Dr. Vickki Muff for consideration of removal of the toe nail to prevent the nail from being ripped from the nail bed since it is already unattached. Advised patient to keep feet clean and dry. Discussed ways to protect the nail, including wearing closed toe shoes until seen by podiatry. He is to call if any other injury occurs before being seen or if signs of infection develop.  - Ambulatory referral to [place], PA-C  [facility]

## 2017-02-01 ENCOUNTER — Other Ambulatory Visit: Payer: Self-pay | Admitting: Family Medicine

## 2017-02-07 DIAGNOSIS — M5030 Other cervical disc degeneration, unspecified cervical region: Secondary | ICD-10-CM | POA: Diagnosis not present

## 2017-02-07 DIAGNOSIS — M5412 Radiculopathy, cervical region: Secondary | ICD-10-CM | POA: Diagnosis not present

## 2017-02-11 ENCOUNTER — Telehealth: Payer: Self-pay | Admitting: Family Medicine

## 2017-02-11 MED ORDER — EMPAGLIFLOZIN 25 MG PO TABS: 25.0000 mg | ORAL_TABLET | Freq: Every day | ORAL | 0 refills | Status: DC

## 2017-02-11 NOTE — Telephone Encounter (Signed)
Pt called needing sample of the Jardiance 25 mg  He is almost out  Thanks teri

## 2017-02-11 NOTE — Telephone Encounter (Signed)
Samples ready to pick-up. Patient was notified.

## 2017-02-11 NOTE — Telephone Encounter (Signed)
He can have 2 bottles of Jardiance 25mg .

## 2017-02-11 NOTE — Telephone Encounter (Signed)
Please advise 

## 2017-02-12 DIAGNOSIS — M48062 Spinal stenosis, lumbar region with neurogenic claudication: Secondary | ICD-10-CM | POA: Diagnosis not present

## 2017-02-12 DIAGNOSIS — M5030 Other cervical disc degeneration, unspecified cervical region: Secondary | ICD-10-CM | POA: Diagnosis not present

## 2017-02-12 DIAGNOSIS — M5136 Other intervertebral disc degeneration, lumbar region: Secondary | ICD-10-CM | POA: Diagnosis not present

## 2017-02-12 DIAGNOSIS — M5416 Radiculopathy, lumbar region: Secondary | ICD-10-CM | POA: Diagnosis not present

## 2017-02-12 DIAGNOSIS — M4802 Spinal stenosis, cervical region: Secondary | ICD-10-CM | POA: Diagnosis not present

## 2017-02-14 DIAGNOSIS — L03032 Cellulitis of left toe: Secondary | ICD-10-CM | POA: Diagnosis not present

## 2017-02-25 ENCOUNTER — Other Ambulatory Visit: Payer: Self-pay | Admitting: Family Medicine

## 2017-02-25 ENCOUNTER — Other Ambulatory Visit: Payer: Self-pay

## 2017-02-25 DIAGNOSIS — N3281 Overactive bladder: Secondary | ICD-10-CM

## 2017-02-25 MED ORDER — MIRABEGRON ER 50 MG PO TB24: 50.0000 mg | ORAL_TABLET | Freq: Every day | ORAL | 12 refills | Status: DC

## 2017-02-25 NOTE — Telephone Encounter (Signed)
Meloxicam last RF 02/01/17 with 5 refills Lasix last RF 05/06/15 Last OV- 02/11/17

## 2017-02-25 NOTE — Telephone Encounter (Addendum)
[facility] faxed a request on the following medications. Thanks CC  furosemide (LASIX) 40 MG tablet  meloxicam (MOBIC) 15 MG tablet  metoprolol tartrate (LOPRESSOR) 25 MG tablet  doxazosin (CARDURA) 2 MG tablet  potassium chloride SA (K-DUR,KLOR-CON) 20 MEQ tablet  omeprazole (PRILOSEC) 20 MG capsule  simvastatin (ZOCOR) 40 MG tablet  traZODone (DESYREL) 100 MG tablet

## 2017-02-27 MED ORDER — OMEPRAZOLE 20 MG PO CPDR: 20.0000 mg | DELAYED_RELEASE_CAPSULE | Freq: Two times a day (BID) | ORAL | 3 refills | Status: DC

## 2017-02-27 MED ORDER — TRAZODONE HCL 100 MG PO TABS: 50.0000 mg | ORAL_TABLET | Freq: Every evening | ORAL | 3 refills | Status: DC | PRN

## 2017-02-27 MED ORDER — POTASSIUM CHLORIDE CRYS ER 20 MEQ PO TBCR: 20.0000 meq | EXTENDED_RELEASE_TABLET | Freq: Every day | ORAL | 3 refills | Status: DC

## 2017-02-27 MED ORDER — MELOXICAM 15 MG PO TABS: 15.0000 mg | ORAL_TABLET | Freq: Every day | ORAL | 1 refills | Status: DC | PRN

## 2017-02-27 MED ORDER — FUROSEMIDE 40 MG PO TABS: 40.0000 mg | ORAL_TABLET | Freq: Every day | ORAL | 4 refills | Status: DC

## 2017-02-27 MED ORDER — METOPROLOL TARTRATE 25 MG PO TABS: 25.0000 mg | ORAL_TABLET | Freq: Two times a day (BID) | ORAL | 4 refills | Status: DC

## 2017-02-27 MED ORDER — SIMVASTATIN 40 MG PO TABS: 40.0000 mg | ORAL_TABLET | Freq: Every day | ORAL | 4 refills | Status: DC

## 2017-02-27 NOTE — Telephone Encounter (Signed)
Patient is calling office back to get an update, he states that [facility] states we have denied approval for prescriptions below to be filled. Please advise. KW

## 2017-02-27 NOTE — Telephone Encounter (Signed)
Patient advised RX was sent to [facility] today.

## 2017-03-01 ENCOUNTER — Telehealth: Payer: Self-pay | Admitting: Urology

## 2017-03-01 NOTE — Telephone Encounter (Signed)
[facility] called wants you to return the call @ [phone] ref# [id number] in regards to his script for Myrbetriq.   Thanks,  Sharyn Lull

## 2017-03-04 NOTE — Telephone Encounter (Signed)
Spoke with [facility] and information was provided.

## 2017-03-07 ENCOUNTER — Telehealth: Payer: Self-pay | Admitting: Family Medicine

## 2017-03-07 MED ORDER — EMPAGLIFLOZIN 25 MG PO TABS: 25.0000 mg | ORAL_TABLET | Freq: Every day | ORAL | 0 refills | Status: DC

## 2017-03-07 NOTE — Telephone Encounter (Signed)
Ok to have samples? Please advise.

## 2017-03-07 NOTE — Telephone Encounter (Signed)
Pt needs more samples of jardiance.   His call back is  [phone]  Thanks teri

## 2017-03-07 NOTE — Telephone Encounter (Signed)
Can have two bottles if we have them

## 2017-03-07 NOTE — Telephone Encounter (Signed)
Patient notified samples are ready.

## 2017-03-15 DIAGNOSIS — M5136 Other intervertebral disc degeneration, lumbar region: Secondary | ICD-10-CM | POA: Diagnosis not present

## 2017-03-15 DIAGNOSIS — M48062 Spinal stenosis, lumbar region with neurogenic claudication: Secondary | ICD-10-CM | POA: Diagnosis not present

## 2017-03-15 DIAGNOSIS — M5416 Radiculopathy, lumbar region: Secondary | ICD-10-CM | POA: Diagnosis not present

## 2017-03-18 DIAGNOSIS — L578 Other skin changes due to chronic exposure to nonionizing radiation: Secondary | ICD-10-CM | POA: Diagnosis not present

## 2017-03-18 DIAGNOSIS — C44319 Basal cell carcinoma of skin of other parts of face: Secondary | ICD-10-CM | POA: Diagnosis not present

## 2017-03-18 DIAGNOSIS — Z85820 Personal history of malignant melanoma of skin: Secondary | ICD-10-CM | POA: Diagnosis not present

## 2017-03-18 DIAGNOSIS — Z872 Personal history of diseases of the skin and subcutaneous tissue: Secondary | ICD-10-CM | POA: Diagnosis not present

## 2017-03-18 DIAGNOSIS — D485 Neoplasm of uncertain behavior of skin: Secondary | ICD-10-CM | POA: Diagnosis not present

## 2017-03-18 DIAGNOSIS — L570 Actinic keratosis: Secondary | ICD-10-CM | POA: Diagnosis not present

## 2017-03-19 DIAGNOSIS — G4733 Obstructive sleep apnea (adult) (pediatric): Secondary | ICD-10-CM | POA: Diagnosis not present

## 2017-03-20 ENCOUNTER — Telehealth: Payer: Self-pay | Admitting: Family Medicine

## 2017-03-20 MED ORDER — EMPAGLIFLOZIN 25 MG PO TABS: 25.0000 mg | ORAL_TABLET | Freq: Every day | ORAL | 0 refills | Status: DC

## 2017-03-20 NOTE — Telephone Encounter (Signed)
Please advise 

## 2017-03-20 NOTE — Telephone Encounter (Signed)
He can have samples of Jardiance 10mg  tablet, will need to take 2 tablets daily, can have 4 boxes

## 2017-03-20 NOTE — Telephone Encounter (Signed)
Pt would like to know if we have any samples of Jardiance.  Pt also wants to let you know he is going to use [facility] for all his maintenance medications  Pt' call back is [phone]  Thanks teri

## 2017-03-20 NOTE — Telephone Encounter (Signed)
Patient was notified samples are ready to pick-up.

## 2017-03-28 ENCOUNTER — Other Ambulatory Visit: Payer: Self-pay | Admitting: Family Medicine

## 2017-04-01 ENCOUNTER — Other Ambulatory Visit: Payer: Self-pay | Admitting: Family Medicine

## 2017-04-01 MED ORDER — EMPAGLIFLOZIN 25 MG PO TABS: 25.0000 mg | ORAL_TABLET | Freq: Every day | ORAL | 4 refills | Status: DC

## 2017-04-01 NOTE — Telephone Encounter (Signed)
[facility] faxed a refill request on the following medications:  empagliflozin (JARDIANCE) 25 MG TABS tablet  [facility]

## 2017-04-01 NOTE — Telephone Encounter (Signed)
Pharmacy requesting refills. Thanks!  

## 2017-04-02 DIAGNOSIS — C44319 Basal cell carcinoma of skin of other parts of face: Secondary | ICD-10-CM | POA: Diagnosis not present

## 2017-04-02 DIAGNOSIS — C4491 Basal cell carcinoma of skin, unspecified: Secondary | ICD-10-CM | POA: Diagnosis not present

## 2017-04-25 DIAGNOSIS — Z96641 Presence of right artificial hip joint: Secondary | ICD-10-CM | POA: Diagnosis not present

## 2017-04-30 ENCOUNTER — Other Ambulatory Visit: Payer: Self-pay | Admitting: Family Medicine

## 2017-04-30 DIAGNOSIS — I10 Essential (primary) hypertension: Secondary | ICD-10-CM

## 2017-04-30 NOTE — Telephone Encounter (Addendum)
[facility] faxed a refill request on the following medication. Thanks CC  gabapentin (NEURONTIN) 300 MG capsule   doxazosin (CARDURA) 2 MG tablet   fluticasone (FLONASE) 50 MCG/ACT nasal spray   ACCU-CHEK COMPACT PLUS test strip

## 2017-05-01 ENCOUNTER — Other Ambulatory Visit: Payer: Self-pay | Admitting: Family Medicine

## 2017-05-01 MED ORDER — GLUCOSE BLOOD VI STRP: ORAL_STRIP | 3 refills | Status: DC

## 2017-05-01 MED ORDER — GABAPENTIN 300 MG PO CAPS
300.0000 mg | ORAL_CAPSULE | Freq: Two times a day (BID) | ORAL | 3 refills | Status: DC
Start: 2017-05-01 — End: 2018-03-22

## 2017-05-01 MED ORDER — FLUTICASONE PROPIONATE 50 MCG/ACT NA SUSP: NASAL | 2 refills | Status: DC

## 2017-05-01 NOTE — Telephone Encounter (Signed)
Please review. Thanks!  

## 2017-05-02 ENCOUNTER — Ambulatory Visit (INDEPENDENT_AMBULATORY_CARE_PROVIDER_SITE_OTHER): Payer: Medicare Other

## 2017-05-02 ENCOUNTER — Other Ambulatory Visit: Payer: Self-pay | Admitting: Family Medicine

## 2017-05-02 DIAGNOSIS — Z23 Encounter for immunization: Secondary | ICD-10-CM

## 2017-05-02 MED ORDER — GLUCOSE BLOOD VI STRP: ORAL_STRIP | 3 refills | Status: DC

## 2017-05-02 MED ORDER — DOXAZOSIN MESYLATE 2 MG PO TABS: 2.0000 mg | ORAL_TABLET | Freq: Every day | ORAL | 4 refills | Status: DC
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
## 2017-05-08 ENCOUNTER — Ambulatory Visit (INDEPENDENT_AMBULATORY_CARE_PROVIDER_SITE_OTHER): Payer: Medicare Other | Admitting: Urology

## 2017-05-08 ENCOUNTER — Encounter: Payer: Self-pay | Admitting: Urology

## 2017-05-08 VITALS — BP 114/68 | HR 87 | Ht 62.0 in | Wt 239.0 lb

## 2017-05-08 DIAGNOSIS — N401 Enlarged prostate with lower urinary tract symptoms: Secondary | ICD-10-CM

## 2017-05-08 DIAGNOSIS — N3941 Urge incontinence: Secondary | ICD-10-CM

## 2017-05-08 DIAGNOSIS — R350 Frequency of micturition: Secondary | ICD-10-CM

## 2017-05-08 DIAGNOSIS — R351 Nocturia: Secondary | ICD-10-CM | POA: Diagnosis not present

## 2017-05-08 LAB — BLADDER SCAN AMB NON-IMAGING: Scan Result: 26

## 2017-05-08 MED ORDER — OXYBUTYNIN CHLORIDE ER 15 MG PO TB24: 15.0000 mg | ORAL_TABLET | Freq: Every day | ORAL | 0 refills | Status: DC

## 2017-05-08 MED ORDER — OXYBUTYNIN CHLORIDE ER 15 MG PO TB24: 15.0000 mg | ORAL_TABLET | Freq: Every day | ORAL | 3 refills | Status: DC

## 2017-05-08 MED ORDER — MIRABEGRON ER 50 MG PO TB24: 50.0000 mg | ORAL_TABLET | Freq: Every day | ORAL | 3 refills | Status: DC

## 2017-05-10 ENCOUNTER — Other Ambulatory Visit: Payer: Self-pay | Admitting: Family Medicine

## 2017-05-10 MED ORDER — GLIMEPIRIDE 1 MG PO TABS: 1.0000 mg | ORAL_TABLET | Freq: Every day | ORAL | 4 refills | Status: DC

## 2017-05-10 NOTE — Progress Notes (Signed)
Patient: Mark Terry. Male    DOB: [birth year]   82 y.o.   MRN: [id number] Visit Date: 05/13/2017  Today's Provider: [name], MD   Chief Complaint  Patient presents with  . Diabetes  . Hypertension  . Hyperlipidemia   Subjective:    HPI   Diabetes Mellitus Type II, Follow-up:   Lab Results  Component Value Date   HGBA1C 8.9 05/13/2017   HGBA1C 8.6 01/08/2017   HGBA1C 7.9 09/03/2016   Last seen for diabetes 4 months ago.  Management since then includes; no changes. He reports good compliance with treatment. He is not having side effects.  Current symptoms include none and have been stable. Home blood sugar records: fasting range: 150 this morning  Episodes of hypoglycemia? no   Current Insulin Regimen: none Most Recent Eye Exam: up to date Weight trend: stable Prior visit with dietician: no Current diet: well balanced Current exercise: none    Hypertension, follow-up:  BP Readings from Last 3 Encounters:  05/13/17 128/72  05/08/17 114/68  01/24/17 140/80    He was last seen for hypertension 4 months ago.  BP at that visit was 136/76. Management since that visit includes; no changes.He reports good compliance with treatment. He is not having side effects.  He is not exercising. He is adherent to low salt diet.   Outside blood pressures are checked daily. Patient reports that it averages in 130s/70s. He is experiencing none.  Patient denies exertional chest pressure/discomfort and palpitations.   Cardiovascular risk factors include diabetes mellitus and dyslipidemia.     Lipid/Cholesterol, Follow-up:   Last seen for this 9 months ago.  Management since that visit includes; no changes.   Last Lipid Panel:    Component Value Date/Time   CHOL 140 09/03/2016 0919   TRIG 186 (H) 09/03/2016 0919   HDL 46 09/03/2016 0919   CHOLHDL 3.0 09/03/2016 0919   LDLCALC 57 09/03/2016 0919    He reports good compliance with treatment. He  is not having side effects.   Wt Readings from Last 3 Encounters:  05/13/17 244 lb (110.7 kg)  05/08/17 239 lb (108.4 kg)  01/17/17 242 lb (109.8 kg)       Allergies  Allergen Reactions  . Propoxyphene Anaphylaxis  . Codeine Sulfate     Patient denies  . Darvon [Propoxyphene Hcl] Other (See Comments)    Short of breath  . Demerol [Meperidine] Other (See Comments)    Short of breath  . Oxycodone Nausea Only     Current Outpatient Medications:  .  acetaminophen (TYLENOL) 325 MG tablet, Take 650 mg by mouth every 6 (six) hours as needed. Reported on 01/13/2016, Disp: , Rfl:  .  aspirin 81 MG tablet, Take 81 mg by mouth daily., Disp: , Rfl:  .  doxazosin (CARDURA) 2 MG tablet, Take 1 tablet (2 mg total) by mouth daily., Disp: 90 tablet, Rfl: 4 .  empagliflozin (JARDIANCE) 25 MG TABS tablet, Take 25 mg by mouth daily., Disp: 90 tablet, Rfl: 4 .  fluticasone (FLONASE) 50 MCG/ACT nasal spray, USE ONE TO TWO SPRAYS IN EACH NOSTRIL EVERY DAY, Disp: 42 g, Rfl: 2 .  furosemide (LASIX) 40 MG tablet, Take 1 tablet (40 mg total) by mouth daily., Disp: 90 tablet, Rfl: 4 .  gabapentin (NEURONTIN) 300 MG capsule, Take 1 capsule (300 mg total) by mouth 2 (two) times daily., Disp: 180 capsule, Rfl: 3 .  glimepiride (AMARYL) 1 MG tablet,  Take 1 tablet (1 mg total) by mouth daily., Disp: 90 tablet, Rfl: 4 .  glucose blood (ACCU-CHEK COMPACT PLUS) test strip, CHECK BLOOD SUGAR DAILY FOR TYPE 2 DIABETES e11.9, Disp: 100 each, Rfl: 3 .  HYDROcodone-acetaminophen (NORCO/VICODIN) 5-325 MG tablet, Take 1 tablet by mouth every 6 (six) hours as needed for moderate pain. (Patient taking differently: Take 1 tablet by mouth every 12 (twelve) hours as needed for moderate pain. ), Disp: 30 tablet, Rfl: 0 .  loratadine (CLARITIN) 10 MG tablet, Take 10 mg by mouth daily., Disp: , Rfl:  .  meloxicam (MOBIC) 15 MG tablet, Take 1 tablet (15 mg total) by mouth daily as needed., Disp: 90 tablet, Rfl: 1 .  metFORMIN  (GLUCOPHAGE-XR) 500 MG 24 hr tablet, Take 1 tablet (500 mg total) by mouth daily., Disp: 90 tablet, Rfl: 4 .  metoprolol tartrate (LOPRESSOR) 25 MG tablet, Take 1 tablet (25 mg total) by mouth 2 (two) times daily., Disp: 180 tablet, Rfl: 4 .  mirabegron ER (MYRBETRIQ) 50 MG TB24 tablet, Take 1 tablet (50 mg total) by mouth daily., Disp: 90 tablet, Rfl: 3 .  Multiple Vitamin (MULTIVITAMIN WITH MINERALS) TABS, Take 1 tablet by mouth daily., Disp: , Rfl:  .  omeprazole (PRILOSEC) 20 MG capsule, Take 1 capsule (20 mg total) by mouth 2 (two) times daily before a meal., Disp: 180 capsule, Rfl: 3 .  oxybutynin (DITROPAN XL) 15 MG 24 hr tablet, Take 1 tablet (15 mg total) by mouth at bedtime., Disp: 30 tablet, Rfl: 0 .  potassium chloride SA (K-DUR,KLOR-CON) 20 MEQ tablet, Take 1 tablet (20 mEq total) by mouth daily., Disp: 90 tablet, Rfl: 3 .  simvastatin (ZOCOR) 40 MG tablet, Take 1 tablet (40 mg total) by mouth at bedtime., Disp: 90 tablet, Rfl: 4 .  traZODone (DESYREL) 100 MG tablet, Take 0.5-1 tablets (50-100 mg total) by mouth at bedtime as needed for sleep., Disp: 90 tablet, Rfl: 3  Review of Systems  Constitutional: Negative for appetite change, chills and fever.  Respiratory: Negative for chest tightness, shortness of breath and wheezing.   Cardiovascular: Negative for chest pain and palpitations.  Gastrointestinal: Negative for abdominal pain, nausea and vomiting.    Social History   Tobacco Use  . Smoking status: Former Smoker    Packs/day: 1.00    Years: 25.00    Pack years: 25.00    Types: Cigarettes    Last attempt to quit: 07/09/1968    Years since quitting: 48.8  . Smokeless tobacco: Never Used  . Tobacco comment: quit at age 62  Substance Use Topics  . Alcohol use: No    Alcohol/week: 0.0 oz   Objective:   BP 128/72 (BP Location: Left Arm, Patient Position: Sitting, Cuff Size: Large)   Pulse 60   Temp 97.8 F (36.6 C)   Resp 16   Ht 5\' 2"  (1.575 m)   Wt 244 lb (110.7  kg)   SpO2 97%   BMI 44.63 kg/m  Vitals:   05/13/17 0820  BP: 128/72  Pulse: 60  Resp: 16  Temp: 97.8 F (36.6 C)  SpO2: 97%  Weight: 244 lb (110.7 kg)  Height: 5\' 2"  (1.575 m)     Physical Exam  General Appearance:    Alert, cooperative, no distress, obese  Eyes:    PERRL, conjunctiva/corneas clear, EOM's intact       Lungs:     Clear to auscultation bilaterally, respirations unlabored  Heart:    Regular rate and  rhythm  Neurologic:   Awake, alert, oriented x 3. No apparent focal neurological           defect.       Results for orders placed or performed in visit on 05/13/17  POCT glycosylated hemoglobin (Hb A1C)  Result Value Ref Range   Hemoglobin A1C 8.9    Est. average glucose Bld gHb Est-mCnc 206        Assessment & Plan:     1. Type 2 diabetes mellitus with chronic kidney disease, without long-term current use of insulin, unspecified CKD stage (HCC) Increase metformin to 500mg  BID.  - POCT glycosylated hemoglobin (Hb A1C) - Renal function panel  2. Essential (primary) hypertension Well controlled.   - Renal function panel  3. Morbid obesity ([place]) Exercise as tolerated and work on reducing caloric intake to lose weight.   Return in about 4 months (around 09/10/2017).       [name], MD  [facility]

## 2017-05-10 NOTE — Telephone Encounter (Signed)
[facility] faxed a request for the following medication. Thanks CC  glimepiride (AMARYL) 1 MG tablet

## 2017-05-13 ENCOUNTER — Ambulatory Visit (INDEPENDENT_AMBULATORY_CARE_PROVIDER_SITE_OTHER): Payer: Medicare Other | Admitting: Family Medicine

## 2017-05-13 ENCOUNTER — Other Ambulatory Visit: Payer: Self-pay | Admitting: Family Medicine

## 2017-05-13 VITALS — BP 128/72 | HR 60 | Temp 97.8°F | Resp 16 | Ht 62.0 in | Wt 244.0 lb

## 2017-05-13 DIAGNOSIS — E1122 Type 2 diabetes mellitus with diabetic chronic kidney disease: Secondary | ICD-10-CM

## 2017-05-13 DIAGNOSIS — I10 Essential (primary) hypertension: Secondary | ICD-10-CM

## 2017-05-13 LAB — POCT GLYCOSYLATED HEMOGLOBIN (HGB A1C)
ESTIMATED AVERAGE GLUCOSE: 206
HEMOGLOBIN A1C: 8.9

## 2017-05-13 LAB — RENAL FUNCTION PANEL
Albumin: 4.1 g/dL (ref 3.6–5.1)
BUN/Creatinine Ratio: 14 (calc) (ref 6–22)
BUN: 23 mg/dL (ref 7–25)
CALCIUM: 9.6 mg/dL (ref 8.6–10.3)
CO2: 29 mmol/L (ref 20–32)
Chloride: 105 mmol/L (ref 98–110)
Creat: 1.7 mg/dL — ABNORMAL HIGH (ref 0.70–1.11)
GLUCOSE: 157 mg/dL — AB (ref 65–99)
PHOSPHORUS: 3 mg/dL (ref 2.1–4.3)
Potassium: 4.7 mmol/L (ref 3.5–5.3)
Sodium: 141 mmol/L (ref 135–146)

## 2017-05-13 MED ORDER — METFORMIN HCL ER 500 MG PO TB24: 500.0000 mg | ORAL_TABLET | Freq: Two times a day (BID) | ORAL | 4 refills | Status: DC

## 2017-05-14 DIAGNOSIS — M48062 Spinal stenosis, lumbar region with neurogenic claudication: Secondary | ICD-10-CM | POA: Diagnosis not present

## 2017-05-14 DIAGNOSIS — M5136 Other intervertebral disc degeneration, lumbar region: Secondary | ICD-10-CM | POA: Diagnosis not present

## 2017-05-14 DIAGNOSIS — M5416 Radiculopathy, lumbar region: Secondary | ICD-10-CM | POA: Diagnosis not present

## 2017-05-14 DIAGNOSIS — M4802 Spinal stenosis, cervical region: Secondary | ICD-10-CM | POA: Diagnosis not present

## 2017-05-14 DIAGNOSIS — M5030 Other cervical disc degeneration, unspecified cervical region: Secondary | ICD-10-CM | POA: Diagnosis not present
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
## 2017-05-28 ENCOUNTER — Encounter: Payer: Self-pay | Admitting: Urology

## 2017-05-28 ENCOUNTER — Ambulatory Visit: Payer: Medicare Other | Admitting: Urology

## 2017-05-28 VITALS — BP 105/66 | HR 92 | Ht 62.0 in | Wt 235.8 lb

## 2017-05-28 DIAGNOSIS — R351 Nocturia: Secondary | ICD-10-CM

## 2017-05-28 DIAGNOSIS — R350 Frequency of micturition: Secondary | ICD-10-CM

## 2017-05-28 DIAGNOSIS — R3916 Straining to void: Secondary | ICD-10-CM

## 2017-05-28 DIAGNOSIS — N3941 Urge incontinence: Secondary | ICD-10-CM

## 2017-05-28 LAB — BLADDER SCAN AMB NON-IMAGING

## 2017-06-05 DIAGNOSIS — M5136 Other intervertebral disc degeneration, lumbar region: Secondary | ICD-10-CM | POA: Diagnosis not present

## 2017-06-05 DIAGNOSIS — M5416 Radiculopathy, lumbar region: Secondary | ICD-10-CM | POA: Diagnosis not present

## 2017-06-05 DIAGNOSIS — M48062 Spinal stenosis, lumbar region with neurogenic claudication: Secondary | ICD-10-CM | POA: Diagnosis not present
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
## 2017-07-10 ENCOUNTER — Encounter: Payer: Self-pay | Admitting: Urology

## 2017-07-10 ENCOUNTER — Ambulatory Visit: Payer: Medicare Other | Admitting: Urology

## 2017-07-10 VITALS — BP 143/72 | HR 89 | Ht 61.0 in | Wt 238.0 lb

## 2017-07-10 DIAGNOSIS — R351 Nocturia: Secondary | ICD-10-CM | POA: Diagnosis not present

## 2017-07-10 DIAGNOSIS — R350 Frequency of micturition: Secondary | ICD-10-CM

## 2017-07-10 DIAGNOSIS — R3916 Straining to void: Secondary | ICD-10-CM

## 2017-07-10 DIAGNOSIS — N3941 Urge incontinence: Secondary | ICD-10-CM

## 2017-07-10 LAB — BLADDER SCAN AMB NON-IMAGING

## 2017-07-10 MED ORDER — OXYBUTYNIN CHLORIDE ER 15 MG PO TB24: 15.0000 mg | ORAL_TABLET | Freq: Every day | ORAL | 3 refills | Status: DC

## 2017-07-12 DIAGNOSIS — M4802 Spinal stenosis, cervical region: Secondary | ICD-10-CM | POA: Diagnosis not present

## 2017-07-12 DIAGNOSIS — M5030 Other cervical disc degeneration, unspecified cervical region: Secondary | ICD-10-CM | POA: Diagnosis not present

## 2017-07-12 DIAGNOSIS — M5412 Radiculopathy, cervical region: Secondary | ICD-10-CM | POA: Diagnosis not present

## 2017-08-19 DIAGNOSIS — M5136 Other intervertebral disc degeneration, lumbar region: Secondary | ICD-10-CM | POA: Diagnosis not present

## 2017-08-19 DIAGNOSIS — M5416 Radiculopathy, lumbar region: Secondary | ICD-10-CM | POA: Diagnosis not present

## 2017-08-19 DIAGNOSIS — M48062 Spinal stenosis, lumbar region with neurogenic claudication: Secondary | ICD-10-CM | POA: Diagnosis not present

## 2017-08-26 DIAGNOSIS — M5030 Other cervical disc degeneration, unspecified cervical region: Secondary | ICD-10-CM | POA: Diagnosis not present

## 2017-08-26 DIAGNOSIS — M48062 Spinal stenosis, lumbar region with neurogenic claudication: Secondary | ICD-10-CM | POA: Diagnosis not present

## 2017-08-26 DIAGNOSIS — M4802 Spinal stenosis, cervical region: Secondary | ICD-10-CM | POA: Diagnosis not present

## 2017-08-26 DIAGNOSIS — M5136 Other intervertebral disc degeneration, lumbar region: Secondary | ICD-10-CM | POA: Diagnosis not present

## 2017-08-26 DIAGNOSIS — M5412 Radiculopathy, cervical region: Secondary | ICD-10-CM | POA: Diagnosis not present

## 2017-09-05 ENCOUNTER — Telehealth: Payer: Self-pay | Admitting: Family Medicine

## 2017-09-05 NOTE — Telephone Encounter (Signed)
Pt stated that he has been having trouble swallowing food on the left side for the last 3 weeks and he has lost 90 % percent of his taste. Pt has OV on 09/10/17 and wanted to let Dr. Caryn Section that he would like to discuss this at his follow up visit on 09/10/17. (I wasn't sure this should wait until next week) Pt stated that he just wanted to let Dr. Caryn Section know incase he wanted to pt to do any testing. Please advise. Thanks TNP

## 2017-09-05 NOTE — Telephone Encounter (Signed)
Dr. Caryn Section, should patient wait until next week to be seen for this, or should I try to get him to come in sooner?

## 2017-09-10 ENCOUNTER — Encounter: Payer: Self-pay | Admitting: Family Medicine

## 2017-09-10 ENCOUNTER — Ambulatory Visit (INDEPENDENT_AMBULATORY_CARE_PROVIDER_SITE_OTHER): Payer: Medicare Other | Admitting: Family Medicine

## 2017-09-10 VITALS — BP 120/84 | HR 71 | Temp 97.7°F | Resp 16 | Ht 61.0 in | Wt 239.0 lb

## 2017-09-10 DIAGNOSIS — E1122 Type 2 diabetes mellitus with diabetic chronic kidney disease: Secondary | ICD-10-CM

## 2017-09-10 DIAGNOSIS — R1319 Other dysphagia: Secondary | ICD-10-CM

## 2017-09-10 DIAGNOSIS — J301 Allergic rhinitis due to pollen: Secondary | ICD-10-CM | POA: Diagnosis not present

## 2017-09-10 DIAGNOSIS — R471 Dysarthria and anarthria: Secondary | ICD-10-CM

## 2017-09-10 DIAGNOSIS — M5116 Intervertebral disc disorders with radiculopathy, lumbar region: Secondary | ICD-10-CM

## 2017-09-10 DIAGNOSIS — I10 Essential (primary) hypertension: Secondary | ICD-10-CM

## 2017-09-10 DIAGNOSIS — R1310 Dysphagia, unspecified: Secondary | ICD-10-CM | POA: Diagnosis not present

## 2017-09-10 LAB — POCT GLYCOSYLATED HEMOGLOBIN (HGB A1C)
ESTIMATED AVERAGE GLUCOSE: 180
Hemoglobin A1C: 7.9

## 2017-09-10 MED ORDER — FLUTICASONE PROPIONATE 50 MCG/ACT NA SUSP: NASAL | 2 refills | Status: DC

## 2017-09-10 MED ORDER — MELOXICAM 15 MG PO TABS: 15.0000 mg | ORAL_TABLET | Freq: Every day | ORAL | 3 refills | Status: DC | PRN

## 2017-09-10 MED ORDER — METFORMIN HCL ER 500 MG PO TB24: 500.0000 mg | ORAL_TABLET | Freq: Every day | ORAL | 0 refills | Status: DC

## 2017-09-10 NOTE — Progress Notes (Signed)
Patient: Mark Terry. Male    DOB: [birth year]   83 y.o.   MRN: [id number] Visit Date: 09/10/2017  Today's Provider: [name], MD   Chief Complaint  Patient presents with  . Follow-up  . Diabetes  . Hypertension   Subjective:    HPI   Diabetes Mellitus Type II, Follow-up:   Lab Results  Component Value Date   HGBA1C 8.9 05/13/2017   HGBA1C 8.6 01/08/2017   HGBA1C 7.9 09/03/2016   Last seen for diabetes 4 months ago.  Management since then includes; increased metformin to 500 mg bid., however he states BID metformin caused loose stools, so he is only taking one a day.    Episodes of hypoglycemia? no  ------------------------------------------------------------------------   Hypertension, follow-up:  BP Readings from Last 3 Encounters:  07/10/17 (!) 143/72  05/28/17 105/66  05/13/17 128/72    He was last seen for hypertension 4 months ago.  BP at that visit was 128/72. Management since that visit includes; no changes.He reports good compliance with treatment. He is not having side effects.  He is exercising. He is adherent to low salt diet.       ------------------------------------------------------------------------  Follow up allergic rhinitis He reports fluticasone remains effective and well tolerated, but he he needs new prescription.   He also needs refill meloxicam which he takes most days and continues to be effective.   He also reports he occasionally has sensation of food getting stuck in throat when swalling. Has not gotten choked on food, but is becoming more frequent.    Allergies  Allergen Reactions  . Propoxyphene Anaphylaxis  . Codeine Sulfate     Patient denies  . Darvon [Propoxyphene Hcl] Other (See Comments)    Short of breath  . Demerol [Meperidine] Other (See Comments)    Short of breath  . Oxycodone Nausea Only     Current Outpatient Medications:  .  aspirin 81 MG tablet, Take 81 mg by mouth daily., Disp:  , Rfl:  .  doxazosin (CARDURA) 2 MG tablet, Take 1 tablet (2 mg total) by mouth daily., Disp: 90 tablet, Rfl: 4 .  empagliflozin (JARDIANCE) 25 MG TABS tablet, Take 25 mg by mouth daily., Disp: 90 tablet, Rfl: 4 .  fluticasone (FLONASE) 50 MCG/ACT nasal spray, USE ONE TO TWO SPRAYS IN EACH NOSTRIL EVERY DAY, Disp: 42 g, Rfl: 2 .  furosemide (LASIX) 40 MG tablet, Take 1 tablet (40 mg total) by mouth daily., Disp: 90 tablet, Rfl: 4 .  gabapentin (NEURONTIN) 300 MG capsule, Take 1 capsule (300 mg total) by mouth 2 (two) times daily., Disp: 180 capsule, Rfl: 3 .  glimepiride (AMARYL) 1 MG tablet, Take 1 tablet (1 mg total) by mouth daily., Disp: 90 tablet, Rfl: 4 .  glucose blood (ACCU-CHEK COMPACT PLUS) test strip, CHECK BLOOD SUGAR DAILY FOR TYPE 2 DIABETES e11.9, Disp: 100 each, Rfl: 3 .  loratadine (CLARITIN) 10 MG tablet, Take 10 mg by mouth daily., Disp: , Rfl:  .  meloxicam (MOBIC) 15 MG tablet, Take 1 tablet (15 mg total) by mouth daily as needed., Disp: 90 tablet, Rfl: 1 .  metFORMIN (GLUCOPHAGE-XR) 500 MG 24 hr tablet, Take 1 tablet (500 mg total) 2 (two) times daily by mouth., Disp: 180 tablet, Rfl: 4 .  metoprolol tartrate (LOPRESSOR) 25 MG tablet, Take 1 tablet (25 mg total) by mouth 2 (two) times daily., Disp: 180 tablet, Rfl: 4 .  mirabegron ER (MYRBETRIQ) 50 MG  TB24 tablet, Take 1 tablet (50 mg total) by mouth daily., Disp: 90 tablet, Rfl: 3 .  Multiple Vitamin (MULTIVITAMIN WITH MINERALS) TABS, Take 1 tablet by mouth daily., Disp: , Rfl:  .  omeprazole (PRILOSEC) 20 MG capsule, Take 1 capsule (20 mg total) by mouth 2 (two) times daily before a meal., Disp: 180 capsule, Rfl: 3 .  oxybutynin (DITROPAN XL) 15 MG 24 hr tablet, Take 1 tablet (15 mg total) by mouth at bedtime., Disp: 90 tablet, Rfl: 3 .  potassium chloride SA (K-DUR,KLOR-CON) 20 MEQ tablet, Take 1 tablet (20 mEq total) by mouth daily., Disp: 90 tablet, Rfl: 3 .  simvastatin (ZOCOR) 40 MG tablet, Take 1 tablet (40 mg total) by  mouth at bedtime., Disp: 90 tablet, Rfl: 4 .  traZODone (DESYREL) 100 MG tablet, Take 0.5-1 tablets (50-100 mg total) by mouth at bedtime as needed for sleep., Disp: 90 tablet, Rfl: 3  Review of Systems  Constitutional: Negative for appetite change, chills and fever.  Respiratory: Negative for chest tightness, shortness of breath and wheezing.   Cardiovascular: Negative for chest pain and palpitations.  Gastrointestinal: Negative for abdominal pain, nausea and vomiting.    Social History   Tobacco Use  . Smoking status: Former Smoker    Packs/day: 1.00    Years: 25.00    Pack years: 25.00    Types: Cigarettes    Last attempt to quit: 07/09/1968    Years since quitting: 49.2  . Smokeless tobacco: Never Used  . Tobacco comment: quit at age 55  Substance Use Topics  . Alcohol use: No    Alcohol/week: 0.0 oz   Objective:   BP 120/84 (BP Location: Right Arm, Patient Position: Sitting, Cuff Size: Large)   Pulse 71   Temp 97.7 F (36.5 C) (Oral)   Resp 16   Ht 5\' 1"  (1.549 m)   Wt 239 lb (108.4 kg)   SpO2 99%   BMI 45.16 kg/m     Physical Exam  General Appearance:    Alert, cooperative, no distress, obese  Eyes:    PERRL, conjunctiva/corneas clear, EOM's intact       Lungs:     Clear to auscultation bilaterally, respirations unlabored  Heart:    Regular rate and rhythm  Neurologic:   Awake, alert, oriented x 3. No apparent focal neurological           defect.       Results for orders placed or performed in visit on 09/10/17  POCT glycosylated hemoglobin (Hb A1C)  Result Value Ref Range   Hemoglobin A1C 7.9    Est. average glucose Bld gHb Est-mCnc 180        Assessment & Plan:     1. Type 2 diabetes mellitus with chronic kidney disease, without long-term current use of insulin, unspecified CKD stage ([place]) Improving. Continue current medications.   - POCT glycosylated hemoglobin (Hb A1C) - metFORMIN (GLUCOPHAGE-XR) 500 MG 24 hr tablet; Take 1 tablet (500 mg total)  by mouth daily.  Dispense: 3 tablet; Refill: 0  2. Dysarthria   3. Esophageal dysphagia  - DG OP Swallowing Func-Medicare/Speech Path; Future  4. Essential (primary) hypertension Well controlled.  Continue current medications.    5. Neuritis or radiculitis due to rupture of lumbar intervertebral disc Doing well with meloxicam which was refilled today.   6. Allergic rhinitis due to pollen, unspecified seasonality Well controlled.  Continue current medications.  refill - fluticasone (FLONASE) 50 MCG/ACT nasal spray; USE ONE  TO TWO SPRAYS IN EACH NOSTRIL EVERY DAY  Dispense: 16 g; Refill: 2       [name], MD  [facility]

## 2017-10-02 ENCOUNTER — Encounter: Payer: Self-pay | Admitting: Family Medicine

## 2017-10-02 ENCOUNTER — Ambulatory Visit
Admission: RE | Admit: 2017-10-02 | Discharge: 2017-10-02 | Disposition: A | Discharge status: Home / Self Care | Payer: Medicare Other | Source: Ambulatory Visit | Attending: Family Medicine | Admitting: Family Medicine

## 2017-10-02 DIAGNOSIS — M5412 Radiculopathy, cervical region: Secondary | ICD-10-CM | POA: Diagnosis not present

## 2017-10-02 DIAGNOSIS — R1319 Other dysphagia: Secondary | ICD-10-CM

## 2017-10-02 DIAGNOSIS — R1310 Dysphagia, unspecified: Secondary | ICD-10-CM

## 2017-10-02 DIAGNOSIS — M5030 Other cervical disc degeneration, unspecified cervical region: Secondary | ICD-10-CM | POA: Diagnosis not present

## 2017-10-02 NOTE — Therapy (Signed)
[facility] [place], [place], [address] Phone: [phone]   Fax:     Modified Barium Swallow  Patient Details  Name: Mark Terry. MRN: [id number] Date of Birth: 04/02/1935 No data recorded  Encounter Date: 10/02/2017  End of Session - 10/02/17 1307    Visit Number  1    Number of Visits  1    Date for SLP Re-Evaluation  10/02/17    SLP Start Time  75    SLP Stop Time   1308    SLP Time Calculation (min)  38 min    Activity Tolerance  Patient tolerated treatment well       Past Medical History:  Diagnosis Date  . Anemia   . Bruises easily   . H/O esophagogastroduodenoscopy   . H/O hiatal hernia   . History of blood transfusion    no reaction noted  . History of bronchitis    last time a couple of yrs ago  . History of gastric ulcer   . History of MRSA infection   . Leg cramps    takes quinine prn  . Melanoma ([place])   . Panic attacks   . Pneumonia    hx of last time a couple of yrs ago  . PONV (postoperative nausea and vomiting)     Past Surgical History:  Procedure Laterality Date  . ANKLE FUSION Right 11/04/2015   Procedure: ARTHRODESIS ANKLE;  Surgeon: Samara Deist, DPM;  Location: [facility];  Service: Podiatry;  Laterality: Right;  . BACK SURGERY     x 4, last lumb. laminectomy Dr. Annette Stable 2008 cervical fusion x 2  . bilateral cataract surgery    . CHOLECYSTECTOMY  1970  . COLONOSCOPY    . CORONARY ARTERY BYPASS GRAFT  2000    3 vessels  . NECK SURGERY     x 2  . prostate laser surgery     x 2  . REPLACEMENT TOTAL KNEE BILATERAL  '86 and 2000  . REVERSE SHOULDER ARTHROPLASTY  05/30/2012   Procedure: REVERSE SHOULDER ARTHROPLASTY;  Surgeon: Augustin Schooling, MD;  Location: [facility];  Service: Orthopedics;  Laterality: Right;  right reverse shoulder arthroplasty  . TOTAL HIP ARTHROPLASTY Right 2012   [place]    There were no vitals filed for this visit.   Subjective: Patient  behavior: (alertness, ability to follow instructions, etc.): Patient able to verbalize his swallowing complaint and follow directions  Chief complaint: "food hangs in left side of throat"; states that bread is "the worst"   Objective:  Radiological Procedure: A videoflouroscopic evaluation of oral-preparatory, reflex initiation, and pharyngeal phases of the swallow was performed; as well as a screening of the upper esophageal phase.  I. POSTURE: Upright in MBS chair  II. VIEW: Lateral  III. COMPENSATORY STRATEGIES: N/A  IV. BOLUSES ADMINISTERED:   Thin Liquid: 3 straw sips   Nectar-thick Liquid: 1 moderate cup rim   Honey-thick Liquid: DNT   Puree: 2 teaspoon presentations   Mechanical Soft: 1/4 graham cracker in applesauce  V. RESULTS OF EVALUATION: A. ORAL PREPARATORY PHASE: (The lips, tongue, and velum are observed for strength and coordination)       **Overall Severity Rating: Within normal limits  B. SWALLOW INITIATION/REFLEX: (The reflex is normal if "triggered" by the time the bolus reached the base of the tongue)  **Overall Severity Rating: Within normal limits  C. PHARYNGEAL PHASE: (Pharyngeal function is normal if the bolus  shows rapid, smooth, and continuous transit through the pharynx and there is no pharyngeal residue after the swallow)  **Overall Severity Rating: Within normal limits  D. LARYNGEAL PENETRATION: (Material entering into the laryngeal inlet/vestibule but not aspirated) X1- transient penetration of thin liquid  E. ASPIRATION: None  F. ESOPHAGEAL PHASE: (Screening of the upper esophagus): minimal view of cervical esophagus secondary shoulder shadow  ASSESSMENT: This 83 year old man; with complaint of food hanging on the left side of his throat ("bread is the worst"); is presenting with functional oropharyngeal swallowing.  Oral control of the bolus including oral hold, rotary mastication, and anterior to posterior transfer is within normal limits.  Timing of the pharyngeal swallow is within normal limits.  Aspects of the pharyngeal stage of swallowing including tongue base retraction, hyolaryngeal excursion, epiglottic inversion, and duration/amplitude of UES opening are within normal limits.  There is no observed pharyngeal residue or tracheal aspiration.  There was one episode of transient laryngeal penetration (within normal limits for his age).  The patient's complaints do not appear to be due to oropharyngeal swallow function.  The patient may benefit from further evaluation with GI to assess esophageal function.  PLAN/RECOMMENDATIONS:   A. Diet: Regular diet   B. Swallowing Precautions: No particular precautions required from an oropharyngeal swallowing standpoint   C. Recommended consultation to: GI   D. Therapy recommendations: speech therapy is not indicated   E. Results and recommendations were discussed with the patient immediately following the study and the final report routed to the referring MD.     Esophageal dysphagia - Plan: DG OP Swallowing Func-Medicare/Speech Path, DG OP Swallowing Func-Medicare/Speech Path        Problem List Patient Active Problem List   Diagnosis Date Noted  . Dysphagia 09/10/2017  . Primary osteoarthritis of left hip 04/30/2016  . Urge incontinence 12/15/2015  . Nocturia 12/15/2015  . Urinary frequency 12/15/2015  . Arthritis of ankle or foot, degenerative 11/04/2015  . Status post total replacement of right hip 04/25/2015  . Allergic rhinitis 12/20/2014  . Angina pectoris ([place]) 12/20/2014  . Arthritis 12/20/2014  . At risk for falling 12/20/2014  . Back pain with radiation 12/20/2014  . BPH (benign prostatic hyperplasia) 12/20/2014  . Carpal tunnel syndrome 12/20/2014  . CHF (congestive heart failure) ([place]) 12/20/2014  . Fatty infiltration of liver 12/20/2014  . History of colonic polyps 12/20/2014  . Decreased potassium in the blood 12/20/2014  . Cervical pain 12/20/2014   . Skin lesion 12/20/2014  . Change in blood platelet count 12/20/2014  . Arthritis of ankle, right 12/20/2014  . Varus foot deformity, acquired 12/20/2014  . Insomnia   . Cervical spinal stenosis 04/28/2014  . DDD (degenerative disc disease), cervical 04/28/2014  . Cervical radiculitis 04/01/2014  . Neuritis or radiculitis due to rupture of lumbar intervertebral disc 12/21/2013  . DDD (degenerative disc disease), lumbar 12/21/2013  . Lumbar spondylosis 12/21/2013  . Chronic gastritis 12/21/2010  . Pilonidal cyst 09/05/2009  . OSA on CPAP 05/07/2005  . Diabetes mellitus, type 2 ([place]) 04/22/2003  . CAD (coronary artery disease) 10/05/1999  . Essential (primary) hypertension 09/06/1998  . Arthropathia 03/25/1996  . Hypercholesterolemia 03/25/1996  . Morbid obesity ([place]) 09/21/1994  . GERD (gastroesophageal reflux disease) 08/10/1994   Leroy Sea, MS/CCC- SLP  Lou Miner 10/02/2017, Ashok Pall PM  [facility] [place], [place], [address] Phone: [phone]   Fax:     Name: Mark Terry  Brooke Bonito MRN: [id number] Date of Birth: 09/28/1934

## 2017-10-04 IMAGING — CT CT ANGIO CHEST
1 of 2 series · 18 of 30 positions shown · IV contrast (APPLIED)
Comparison: None.

CLINICAL DATA: Sudden onset midsternal chest pain starting this
morning. Right ankle surgery on 11/04/2015.

EXAM:
CT ANGIOGRAPHY CHEST WITH CONTRAST
TECHNIQUE: Multidetector CT imaging of the chest was performed using the
standard protocol during bolus administration of intravenous
contrast. Multiplanar CT image reconstructions and MIPs were
obtained to evaluate the vascular anatomy.
CONTRAST:  75 cc Isovue 370

[Series 5: pe 1.0 thins · axial · 0.85mm/px · z∈[-264,-30]mm · 18 of 265 slices shown]
[im 15/265  lung]
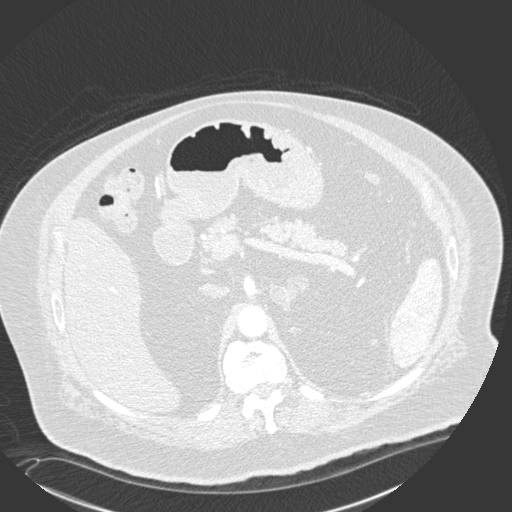
[im 30/265  mediastinal]
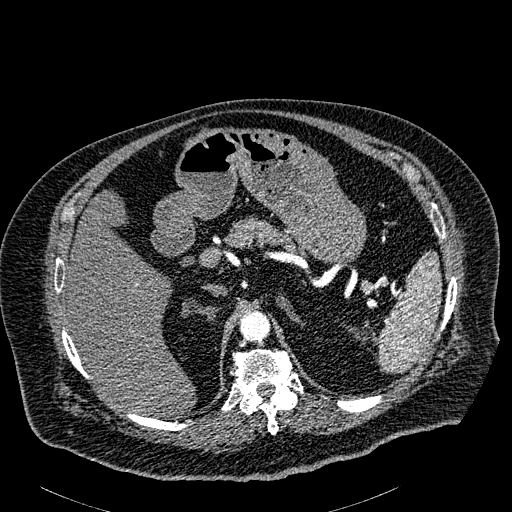
[im 45/265  lung]
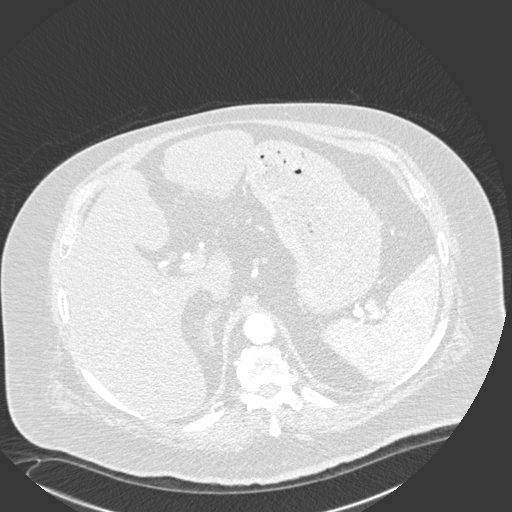
[im 59/265  mediastinal]
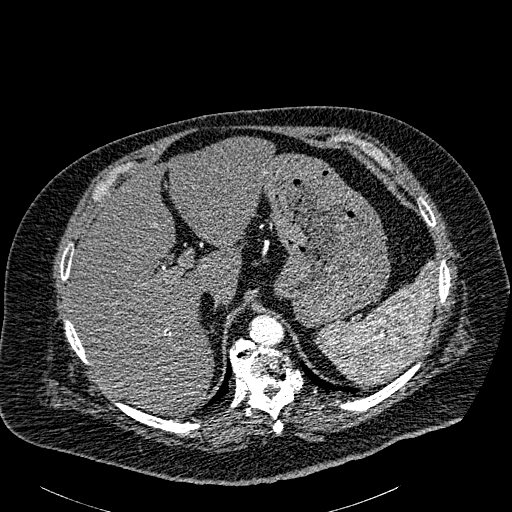
[im 74/265  lung]
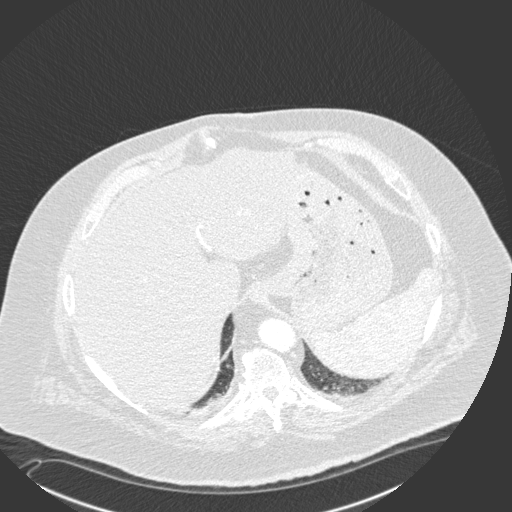
[im 89/265  mediastinal]
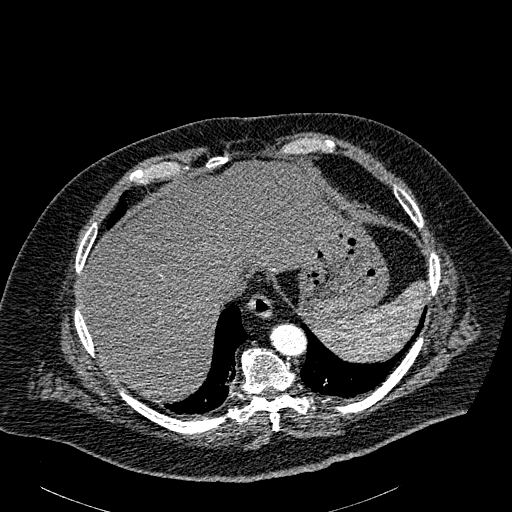
[im 103/265  lung]
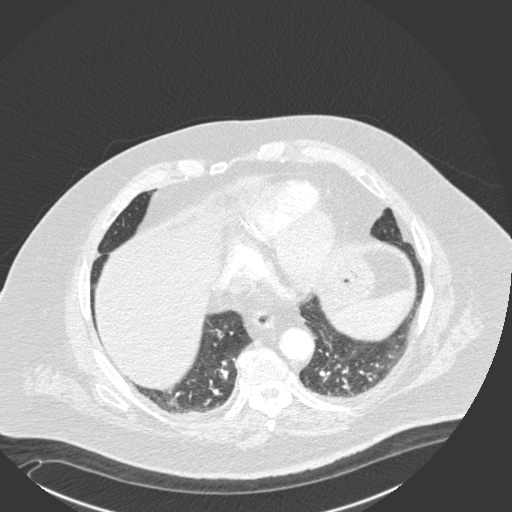
[im 118/265  mediastinal]
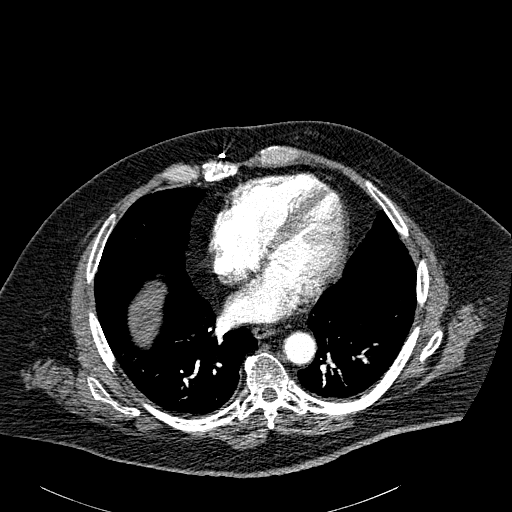
[im 123/265  lung]
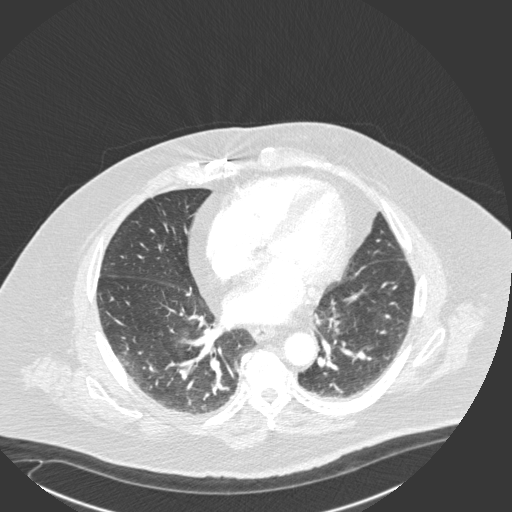
[im 133/265  mediastinal]
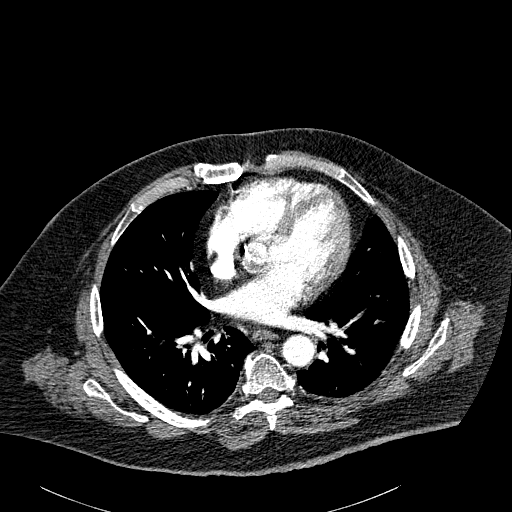
[im 147/265  lung]
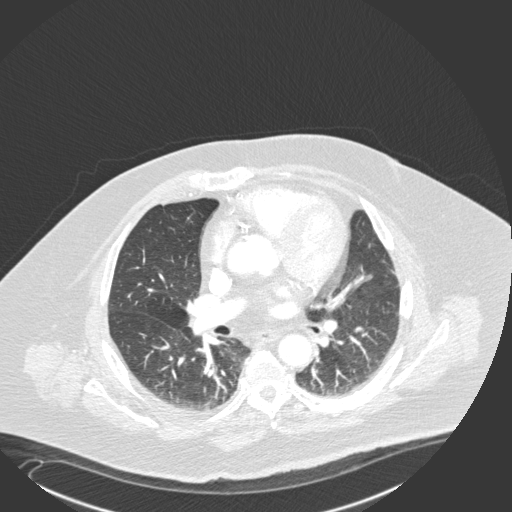
[im 162/265  mediastinal]
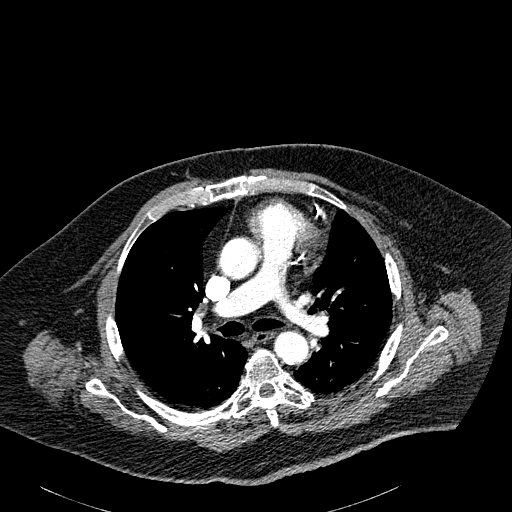
[im 177/265  lung]
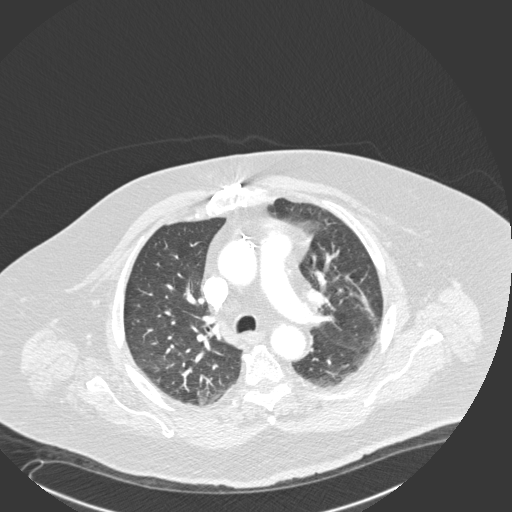
[im 191/265  mediastinal]
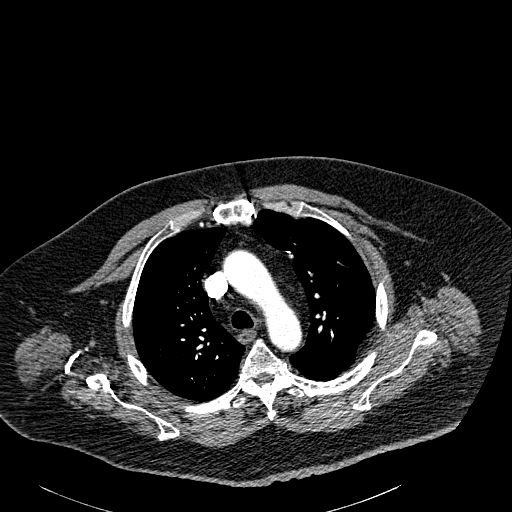
[im 206/265  lung]
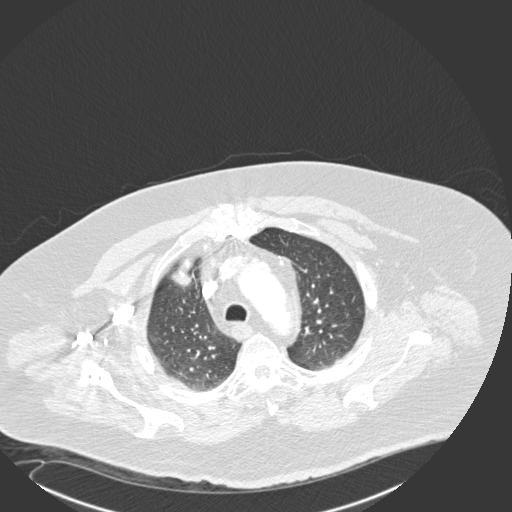
[im 221/265  mediastinal]
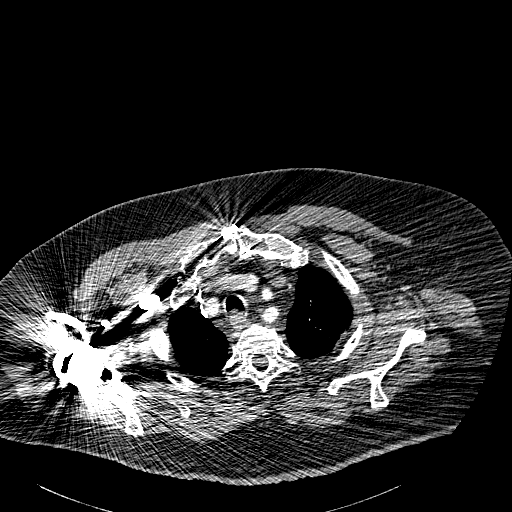
[im 235/265  lung]
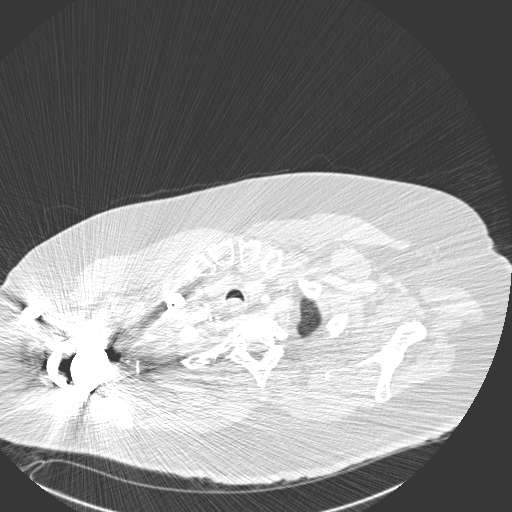
[im 250/265  mediastinal]
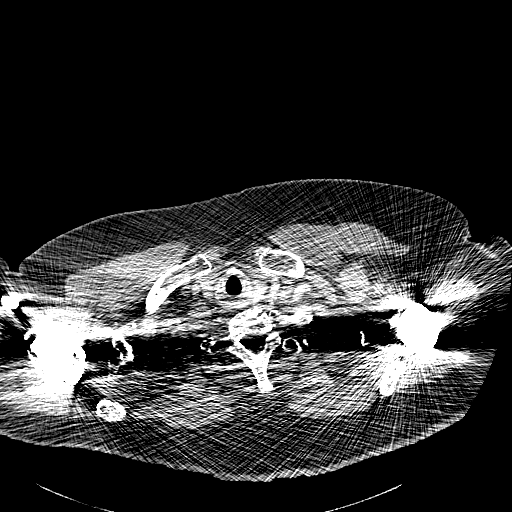

[18 of 30 positions shown; findings below may reference images not displayed]

FINDINGS: Mediastinum/Lymph Nodes: There is no pulmonary embolism identified
within the main, lobar or segmental pulmonary arteries bilaterally.
Some of the peripheral segmental and subsegmental pulmonary arteries
are difficult to definitively characterize due to mild patient
breathing motion artifact.

Scattered atherosclerotic changes noted along the walls of the
normal-caliber thoracic aorta. No aortic aneurysm or dissection.
Heart size is normal. No pericardial effusion. Coronary artery
calcifications noted. Patient is status post CABG.

Lungs/Pleura: Mild atelectasis and/or scarring within each lung. No
pneumonia or pleural effusion. No pneumothorax. Trachea and central
bronchi are unremarkable.

Upper abdomen: No acute findings. Small adrenal masses bilaterally,
too small to definitively characterize, incompletely imaged on the
left. Probable fatty infiltration of the liver, also incompletely
imaged.

Musculoskeletal: No acute or suspicious osseous lesion. Degenerative
changes throughout the thoracic spine, at least moderate in degree.
Status post median sternotomy for CABG. Bilateral shoulder
replacement. Superficial soft tissues are unremarkable.

Review of the MIP images confirms the above findings.
IMPRESSION: 1. No pulmonary embolism seen, with mild study limitations detailed
above.

2.  No aortic aneurysm or dissection.

3. Heart size is normal. No pericardial effusion. Coronary artery
calcifications. Status post CABG.

4. Mild scarring/ atelectasis bilaterally. Lungs otherwise clear. No
evidence of pneumonia. No pleural effusion.

5. Bilateral small adrenal masses, too small to definitively
characterize, incompletely imaged on the left. Consider nonemergent
adrenal protocol MRI at some point to ensure benignity.

6.  Probable fatty infiltration of the liver.

## 2017-10-23 ENCOUNTER — Telehealth: Payer: Self-pay | Admitting: Family Medicine

## 2017-10-23 DIAGNOSIS — R1310 Dysphagia, unspecified: Secondary | ICD-10-CM

## 2017-10-23 NOTE — Telephone Encounter (Signed)
Please schedule referral to GI? Thanks!

## 2017-10-23 NOTE — Telephone Encounter (Signed)
Swallowing study was normal. If he still feels like food is getting stuck in throat or chest then would recommend referral to GI, they would need to do an endoscopy to look down esophagus.

## 2017-10-23 NOTE — Telephone Encounter (Signed)
Pt stated that he had his swallow test done at [facility] a few weeks ago and hasn't received the results. Pt is requesting call back for the results. Please advise. Thanks TNP

## 2017-10-23 NOTE — Telephone Encounter (Signed)
Please advise? Swallow test done on 10/02/2017.

## 2017-10-23 NOTE — Telephone Encounter (Signed)
Patient was notified of results. Expressed understanding. Referral in epic. 

## 2017-10-24 ENCOUNTER — Encounter: Payer: Self-pay | Admitting: *Deleted

## 2017-11-06 DIAGNOSIS — Z85828 Personal history of other malignant neoplasm of skin: Secondary | ICD-10-CM | POA: Diagnosis not present

## 2017-11-06 DIAGNOSIS — L578 Other skin changes due to chronic exposure to nonionizing radiation: Secondary | ICD-10-CM | POA: Diagnosis not present

## 2017-11-06 DIAGNOSIS — Z872 Personal history of diseases of the skin and subcutaneous tissue: Secondary | ICD-10-CM | POA: Diagnosis not present

## 2017-11-06 DIAGNOSIS — L570 Actinic keratosis: Secondary | ICD-10-CM | POA: Diagnosis not present

## 2017-11-06 DIAGNOSIS — Z85820 Personal history of malignant melanoma of skin: Secondary | ICD-10-CM | POA: Diagnosis not present

## 2017-11-07 DIAGNOSIS — M5416 Radiculopathy, lumbar region: Secondary | ICD-10-CM | POA: Diagnosis not present

## 2017-11-07 DIAGNOSIS — M5136 Other intervertebral disc degeneration, lumbar region: Secondary | ICD-10-CM | POA: Diagnosis not present

## 2017-11-14 ENCOUNTER — Encounter: Payer: Self-pay | Admitting: Gastroenterology

## 2017-11-14 ENCOUNTER — Ambulatory Visit: Payer: Medicare Other | Admitting: Gastroenterology

## 2017-11-14 VITALS — BP 126/71 | HR 89 | Ht 61.0 in | Wt 235.2 lb

## 2017-11-14 DIAGNOSIS — R1310 Dysphagia, unspecified: Secondary | ICD-10-CM

## 2017-11-14 MED ORDER — OMEPRAZOLE 40 MG PO CPDR: 40.0000 mg | DELAYED_RELEASE_CAPSULE | Freq: Every day | ORAL | 3 refills | Status: DC

## 2017-11-14 NOTE — Addendum Note (Signed)
Addended by: Peggye Ley on: 11/14/2017 03:27 PM   Modules accepted: Orders, SmartSet

## 2017-11-14 NOTE — Progress Notes (Signed)
Mark Terry, MRCP(U.K) [address]  [place]  [place], [place] [address]  Main: [phone]  Fax: [phone]   Gastroenterology Consultation  Referring Provider:     Birdie Sons, Terry Primary Care Physician:  Birdie Sons, Terry Primary Gastroenterologist:  Dr. Jonathon Terry  Reason for Consultation:     Dysphagia         HPI:   Mark Terry. is a 83 y.o. y/o male referred for consultation & management  by Dr. Caryn Section, Kirstie Peri, Terry.   He has been referred for dysphagia. Modified barium swallow on 10/02/17 was normal       Dysphagia: Onset and any progression: 6 months back getting worse Frequency: every meal  Foods affected : bread and meat - liquids goes down ok.  Prior episodes of impaction: no . Had to cough things up  History of asthma/allergy : yes  History of heartburn/Reflux : no  Weight loss/weight gain : stable   Prior EGD: no  PPI/H2 blocker use : no   Points to his neck where it gets stuck- food has come back up .    He has had cardiac stents and has been told he is high risk for surgery in the past.    Past Medical History:  Diagnosis Date  . Anemia   . Bruises easily   . H/O esophagogastroduodenoscopy   . H/O hiatal hernia   . History of blood transfusion    no reaction noted  . History of bronchitis    last time a couple of yrs ago  . History of gastric ulcer   . History of MRSA infection   . Leg cramps    takes quinine prn  . Melanoma ([place])   . Panic attacks   . Pneumonia    hx of last time a couple of yrs ago  . PONV (postoperative nausea and vomiting)     Past Surgical History:  Procedure Laterality Date  . ANKLE FUSION Right 11/04/2015   Procedure: ARTHRODESIS ANKLE;  Surgeon: Samara Deist, DPM;  Location: [facility];  Service: Podiatry;  Laterality: Right;  . BACK SURGERY     x 4, last lumb. laminectomy Dr. Annette Stable 2008 cervical fusion x 2  . bilateral cataract surgery    . CHOLECYSTECTOMY  1970  . COLONOSCOPY      . CORONARY ARTERY BYPASS GRAFT  2000    3 vessels  . NECK SURGERY     x 2  . prostate laser surgery     x 2  . REPLACEMENT TOTAL KNEE BILATERAL  '86 and 2000  . REVERSE SHOULDER ARTHROPLASTY  05/30/2012   Procedure: REVERSE SHOULDER ARTHROPLASTY;  Surgeon: Augustin Schooling, Terry;  Location: [place];  Service: Orthopedics;  Laterality: Right;  right reverse shoulder arthroplasty  . TOTAL HIP ARTHROPLASTY Right 2012   [facility]    Prior to Admission medications   Medication Sig Start Date End Date Taking? Authorizing Provider  aspirin 81 MG tablet Take 81 mg by mouth daily.    Provider, Historical, Terry  doxazosin (CARDURA) 2 MG tablet Take 1 tablet (2 mg total) by mouth daily. 05/02/17   Birdie Sons, Terry  empagliflozin (JARDIANCE) 25 MG TABS tablet Take 25 mg by mouth daily. 04/01/17   Birdie Sons, Terry  fluticasone [facility]) 50 MCG/ACT nasal spray USE ONE TO TWO SPRAYS IN [facility] NOSTRIL EVERY DAY 09/10/17   Birdie Sons, Terry  furosemide (LASIX) 40 MG tablet  Take 1 tablet (40 mg total) by mouth daily. 02/27/17   Birdie Sons, Terry  gabapentin (NEURONTIN) 300 MG capsule Take 1 capsule (300 mg total) by mouth 2 (two) times daily. 05/01/17   Birdie Sons, Terry  glimepiride (AMARYL) 1 MG tablet Take 1 tablet (1 mg total) by mouth daily. 05/10/17   Birdie Sons, Terry  glucose blood (ACCU-CHEK COMPACT PLUS) test strip CHECK BLOOD SUGAR DAILY FOR TYPE 2 DIABETES e11.9 05/02/17   Birdie Sons, Terry  HYDROcodone-acetaminophen (NORCO/VICODIN) 5-325 MG tablet  11/01/17   Provider, Historical, Terry  loratadine (CLARITIN) 10 MG tablet Take 10 mg by mouth daily.    Provider, Historical, Terry  meloxicam (MOBIC) 15 MG tablet Take 1 tablet (15 mg total) by mouth daily as needed. 09/10/17   Birdie Sons, Terry  metFORMIN (GLUCOPHAGE-XR) 500 MG 24 hr tablet Take 1 tablet (500 mg total) by mouth daily. 09/10/17   Birdie Sons, Terry  metoprolol tartrate (LOPRESSOR) 25 MG tablet Take 1 tablet (25 mg total) by mouth  2 (two) times daily. Patient taking differently: Take 25 mg by mouth daily.  02/27/17   Birdie Sons, Terry  mirabegron ER (MYRBETRIQ) 50 MG TB24 tablet Take 1 tablet (50 mg total) by mouth daily. 05/08/17   Zara Council A, PA-C  Multiple Vitamin (MULTIVITAMIN WITH MINERALS) TABS Take 1 tablet by mouth daily.    Provider, Historical, Terry  omeprazole (PRILOSEC) 20 MG capsule Take 1 capsule (20 mg total) by mouth 2 (two) times daily before a meal. 02/27/17   Birdie Sons, Terry  oxybutynin (DITROPAN XL) 15 MG 24 hr tablet Take 1 tablet (15 mg total) by mouth at bedtime. 07/10/17   Zara Council A, PA-C  potassium chloride SA (K-DUR,KLOR-CON) 20 MEQ tablet Take 1 tablet (20 mEq total) by mouth daily. 02/27/17   Birdie Sons, Terry  simvastatin (ZOCOR) 40 MG tablet Take 1 tablet (40 mg total) by mouth at bedtime. 02/27/17   Birdie Sons, Terry  traZODone (DESYREL) 100 MG tablet Take 0.5-1 tablets (50-100 mg total) by mouth at bedtime as needed for sleep. 02/27/17   Birdie Sons, Terry    Family History  Problem Relation Age of Onset  . Alzheimer's disease Mother   . Heart attack Father   . Bipolar disorder Brother   . Kidney disease Neg Hx   . Prostate cancer Neg Hx      Social History   Tobacco Use  . Smoking status: Former Smoker    Packs/day: 1.00    Years: 25.00    Pack years: 25.00    Types: Cigarettes    Last attempt to quit: 07/09/1968    Years since quitting: 49.3  . Smokeless tobacco: Never Used  . Tobacco comment: quit at age 19  Substance Use Topics  . Alcohol use: No    Alcohol/week: 0.0 oz  . Drug use: No    Allergies as of 11/14/2017 - Review Complete 09/10/2017  Allergen Reaction Noted  . Propoxyphene Anaphylaxis 12/13/2015  . Codeine sulfate  12/20/2014  . Darvon [propoxyphene hcl] Other (See Comments) 05/27/2012  . Demerol [meperidine] Other (See Comments) 05/27/2012  . Oxycodone Nausea Only 06/08/2016    Review of Systems:    All systems reviewed and  negative except where noted in HPI.   Physical Exam:  There were no vitals taken for this visit. No LMP for male patient. Psych:  Alert and cooperative. Normal mood and affect.Mobile with  a Schreifels  General:   Alert,  Well-developed, obese , pleasant and cooperative in NAD Head:  Normocephalic and atraumatic. Eyes:  Sclera clear, no icterus.   Conjunctiva pink. Ears:  Normal auditory acuity. Nose:  No deformity, discharge, or lesions. Mouth:  No deformity or lesions,oropharynx pink & moist. Neck:  Supple; no masses or thyromegaly. Lungs:  Respirations even and unlabored.  Clear throughout to auscultation.   No wheezes, crackles, or rhonchi. No acute distress. Heart:  Regular rate and rhythm; no murmurs, clicks, rubs, or gallops. Abdomen:  Normal bowel sounds.  No bruits.  Soft, non-tender and non-distended without masses, hepatosplenomegaly or hernias noted.  No guarding or rebound tenderness.    Neurologic:  Alert and oriented x3;  grossly normal neurologically. Skin:  Intact without significant lesions or rashes. No jaundice. Lymph Nodes:  No significant cervical adenopathy. Psych:  Alert and cooperative. Normal mood and affect.  Imaging Studies: No results found.  Assessment and Plan:   Ma Rings., 83 y.o. ,male here to see me for dysphagia.  Plan  1. EGD+/- dilation after cardiac clearance 2. PPI for empiric spasm from GERD.    I have discussed alternative options, risks & benefits,  which include, but are not limited to, bleeding, infection, perforation,respiratory complication & drug reaction.  The patient agrees with this plan & written consent will be obtained.   Follow up in 6-8 weeks   Dr Mark Terry,MRCP(U.K)

## 2017-11-15 ENCOUNTER — Telehealth: Payer: Self-pay | Admitting: Family Medicine

## 2017-11-15 NOTE — Telephone Encounter (Signed)
Received [facility] Medical Clearance form, form was placed in providers box. Thanks CC

## 2017-11-15 NOTE — Telephone Encounter (Signed)
Form given to medical records. 

## 2017-11-15 NOTE — Telephone Encounter (Signed)
Form given to Dr. Fisher.  

## 2017-11-18 ENCOUNTER — Ambulatory Visit: Payer: Medicare Other | Admitting: Registered Nurse

## 2017-11-18 ENCOUNTER — Telehealth: Payer: Self-pay

## 2017-11-18 ENCOUNTER — Ambulatory Visit
Admission: RE | Admit: 2017-11-18 | Discharge: 2017-11-18 | Disposition: A | Discharge status: Home / Self Care | Payer: Medicare Other | Source: Ambulatory Visit | Attending: Gastroenterology | Admitting: Gastroenterology

## 2017-11-18 ENCOUNTER — Encounter
Admission: RE | Disposition: A | Discharge status: Home / Self Care | Payer: Self-pay | Source: Ambulatory Visit | Attending: Gastroenterology

## 2017-11-18 ENCOUNTER — Encounter: Payer: Self-pay | Admitting: *Deleted

## 2017-11-18 DIAGNOSIS — F419 Anxiety disorder, unspecified: Secondary | ICD-10-CM | POA: Diagnosis not present

## 2017-11-18 DIAGNOSIS — Z7982 Long term (current) use of aspirin: Secondary | ICD-10-CM | POA: Insufficient documentation

## 2017-11-18 DIAGNOSIS — I2510 Atherosclerotic heart disease of native coronary artery without angina pectoris: Secondary | ICD-10-CM | POA: Insufficient documentation

## 2017-11-18 DIAGNOSIS — G4730 Sleep apnea, unspecified: Secondary | ICD-10-CM | POA: Diagnosis not present

## 2017-11-18 DIAGNOSIS — K219 Gastro-esophageal reflux disease without esophagitis: Secondary | ICD-10-CM | POA: Insufficient documentation

## 2017-11-18 DIAGNOSIS — I509 Heart failure, unspecified: Secondary | ICD-10-CM | POA: Insufficient documentation

## 2017-11-18 DIAGNOSIS — Z79899 Other long term (current) drug therapy: Secondary | ICD-10-CM | POA: Insufficient documentation

## 2017-11-18 DIAGNOSIS — I110 Hypertensive heart disease with heart failure: Secondary | ICD-10-CM | POA: Insufficient documentation

## 2017-11-18 DIAGNOSIS — R1310 Dysphagia, unspecified: Secondary | ICD-10-CM | POA: Insufficient documentation

## 2017-11-18 DIAGNOSIS — Z87891 Personal history of nicotine dependence: Secondary | ICD-10-CM | POA: Insufficient documentation

## 2017-11-18 DIAGNOSIS — J449 Chronic obstructive pulmonary disease, unspecified: Secondary | ICD-10-CM | POA: Diagnosis not present

## 2017-11-18 DIAGNOSIS — Z7984 Long term (current) use of oral hypoglycemic drugs: Secondary | ICD-10-CM | POA: Diagnosis not present

## 2017-11-18 DIAGNOSIS — Z6841 Body Mass Index (BMI) 40.0 and over, adult: Secondary | ICD-10-CM | POA: Insufficient documentation

## 2017-11-18 DIAGNOSIS — E119 Type 2 diabetes mellitus without complications: Secondary | ICD-10-CM | POA: Insufficient documentation

## 2017-11-18 HISTORY — PX: ESOPHAGOGASTRODUODENOSCOPY (EGD) WITH PROPOFOL: SHX5813

## 2017-11-18 LAB — GLUCOSE, CAPILLARY: Glucose-Capillary: 153 mg/dL — ABNORMAL HIGH (ref 65–99)

## 2017-11-18 SURGERY — ESOPHAGOGASTRODUODENOSCOPY (EGD) WITH PROPOFOL
Anesthesia: General

## 2017-11-18 MED ORDER — PROPOFOL 500 MG/50ML IV EMUL
INTRAVENOUS | Status: DC | PRN
  Administered 2017-11-18: 140 ug/kg/min via INTRAVENOUS

## 2017-11-18 MED ORDER — PROPOFOL 10 MG/ML IV BOLUS
INTRAVENOUS | Status: DC | PRN
  Administered 2017-11-18: 70 mg via INTRAVENOUS

## 2017-11-18 MED ORDER — SODIUM CHLORIDE 0.9 % IV SOLN
INTRAVENOUS | Status: DC
  Administered 2017-11-18: 09:00:00 via INTRAVENOUS

## 2017-11-18 MED ORDER — LIDOCAINE HCL (CARDIAC) PF 100 MG/5ML IV SOSY
PREFILLED_SYRINGE | INTRAVENOUS | Status: DC | PRN
  Administered 2017-11-18: 50 mg via INTRAVENOUS

## 2017-11-18 MED ORDER — GLYCOPYRROLATE 0.2 MG/ML IJ SOLN
INTRAMUSCULAR | Status: DC | PRN
  Administered 2017-11-18: 0.2 mg via INTRAVENOUS

## 2017-11-18 NOTE — H&P (Signed)
Jonathon Bellows, MD [address], [place], [place], [place], [address] [address], [place], [place], [place], [address] Phone: [phone]  Fax: [phone]  Primary Care Physician:  Birdie Sons, MD   Pre-Procedure History & Physical: HPI:  Mark Terry. is a 83 y.o. male is here for an endoscopy    Past Medical History:  Diagnosis Date  . Anemia   . Bruises easily   . Diabetes mellitus without complication ([place])   . H/O esophagogastroduodenoscopy   . H/O hiatal hernia   . History of blood transfusion    no reaction noted  . History of bronchitis    last time a couple of yrs ago  . History of gastric ulcer   . History of MRSA infection   . Leg cramps    takes quinine prn  . Melanoma ([place])   . Panic attacks   . Pneumonia    hx of last time a couple of yrs ago  . PONV (postoperative nausea and vomiting)     Past Surgical History:  Procedure Laterality Date  . ANKLE FUSION Right 11/04/2015   Procedure: ARTHRODESIS ANKLE;  Surgeon: Samara Deist, DPM;  Location: [facility];  Service: Podiatry;  Laterality: Right;  . BACK SURGERY     x 4, last lumb. laminectomy Dr. Annette Stable 2008 cervical fusion x 2  . bilateral cataract surgery    . CHOLECYSTECTOMY  1970  . COLONOSCOPY    . CORONARY ANGIOPLASTY    . CORONARY ARTERY BYPASS GRAFT  2000    3 vessels  . JOINT REPLACEMENT    . NECK SURGERY     x 2  . prostate laser surgery     x 2  . REPLACEMENT TOTAL KNEE BILATERAL  '86 and 2000  . REVERSE SHOULDER ARTHROPLASTY  05/30/2012   Procedure: REVERSE SHOULDER ARTHROPLASTY;  Surgeon: Augustin Schooling, MD;  Location: [place] [place];  Service: Orthopedics;  Laterality: Right;  right reverse shoulder arthroplasty  . TOTAL HIP ARTHROPLASTY Right 2012   [place]    Prior to Admission medications   Medication Sig Start Date End Date Taking? Authorizing Provider  aspirin 81 MG tablet Take 81 mg by mouth daily.   Yes [provider]  doxazosin (CARDURA) 2 MG tablet Take  1 tablet (2 mg total) by mouth daily. 05/02/17  Yes Birdie Sons, MD  empagliflozin (JARDIANCE) 25 MG TABS tablet Take 25 mg by mouth daily. 04/01/17  Yes Birdie Sons, MD  fluticasone [facility]) 50 MCG/ACT nasal spray USE ONE TO TWO SPRAYS IN EACH NOSTRIL EVERY DAY 09/10/17  Yes Birdie Sons, MD  furosemide (LASIX) 40 MG tablet Take 1 tablet (40 mg total) by mouth daily. 02/27/17  Yes Birdie Sons, MD  gabapentin (NEURONTIN) 300 MG capsule Take 1 capsule (300 mg total) by mouth 2 (two) times daily. 05/01/17  Yes Birdie Sons, MD  glimepiride (AMARYL) 1 MG tablet Take 1 tablet (1 mg total) by mouth daily. 05/10/17  Yes Birdie Sons, MD  glucose blood (ACCU-CHEK COMPACT PLUS) test strip CHECK BLOOD SUGAR DAILY FOR TYPE 2 DIABETES e11.9 05/02/17  Yes Birdie Sons, MD  HYDROcodone-acetaminophen (NORCO/VICODIN) 5-325 MG tablet  11/01/17  Yes [provider]  loratadine (CLARITIN) 10 MG tablet Take 10 mg by mouth daily.   Yes [provider]  meloxicam (MOBIC) 15 MG tablet Take 1 tablet (15 mg total) by mouth daily as needed. 09/10/17  Yes Fisher,  Kirstie Peri, MD  metFORMIN (GLUCOPHAGE-XR) 500 MG 24 hr tablet Take 1 tablet (500 mg total) by mouth daily. 09/10/17  Yes Birdie Sons, MD  metoprolol tartrate (LOPRESSOR) 25 MG tablet Take 1 tablet (25 mg total) by mouth 2 (two) times daily. Patient taking differently: Take 25 mg by mouth daily.  02/27/17  Yes Birdie Sons, MD  mirabegron ER (MYRBETRIQ) 50 MG TB24 tablet Take 1 tablet (50 mg total) by mouth daily. 05/08/17  Yes McGowan, Larene Beach A, PA-C  Multiple Vitamin (MULTIVITAMIN WITH MINERALS) TABS Take 1 tablet by mouth daily.   Yes [provider]  omeprazole (PRILOSEC) 40 MG capsule Take 1 capsule (40 mg total) by mouth daily. 11/14/17  Yes Jonathon Bellows, MD  oxybutynin (DITROPAN XL) 15 MG 24 hr tablet Take 1 tablet (15 mg total) by mouth at bedtime. 07/10/17  Yes McGowan, Larene Beach A, PA-C  potassium chloride SA  (K-DUR,KLOR-CON) 20 MEQ tablet Take 1 tablet (20 mEq total) by mouth daily. 02/27/17  Yes Birdie Sons, MD  simvastatin (ZOCOR) 40 MG tablet Take 1 tablet (40 mg total) by mouth at bedtime. 02/27/17  Yes Birdie Sons, MD  traZODone (DESYREL) 100 MG tablet Take 0.5-1 tablets (50-100 mg total) by mouth at bedtime as needed for sleep. 02/27/17  Yes Birdie Sons, MD    Allergies as of 11/14/2017 - Review Complete 11/14/2017  Allergen Reaction Noted  . Propoxyphene Anaphylaxis 12/13/2015  . Codeine sulfate  12/20/2014  . Darvon [propoxyphene hcl] Other (See Comments) 05/27/2012  . Demerol [meperidine] Other (See Comments) 05/27/2012  . Oxycodone Nausea Only 06/08/2016    Family History  Problem Relation Age of Onset  . Alzheimer's disease Mother   . Heart attack Father   . Bipolar disorder Brother   . Kidney disease Neg Hx   . Prostate cancer Neg Hx     Social History   Socioeconomic History  . Marital status: Divorced    Spouse name: Not on file  . Number of children: 2  . Years of education: 83  . Highest education level: Not on file  Occupational History  . Occupation: Retired  Scientific laboratory technician  . Financial resource strain: Not on file  . Food insecurity:    Worry: Not on file    Inability: Not on file  . Transportation needs:    Medical: Not on file    Non-medical: Not on file  Tobacco Use  . Smoking status: Former Smoker    Packs/day: 1.00    Years: 25.00    Pack years: 25.00    Types: Cigarettes    Last attempt to quit: 07/09/1968    Years since quitting: 49.3  . Smokeless tobacco: Never Used  . Tobacco comment: quit at age 54  Substance and Sexual Activity  . Alcohol use: No    Alcohol/week: 0.0 oz  . Drug use: No  . Sexual activity: Never  Lifestyle  . Physical activity:    Days per week: Not on file    Minutes per session: Not on file  . Stress: Not on file  Relationships  . Social connections:    Talks on phone: Not on file    Gets together: Not  on file    Attends religious service: Not on file    Active member of club or organization: Not on file    Attends meetings of clubs or organizations: Not on file    Relationship status: Not on file  . Intimate partner  violence:    Fear of current or ex partner: Not on file    Emotionally abused: Not on file    Physically abused: Not on file    Forced sexual activity: Not on file  Other Topics Concern  . Not on file  Social History Narrative  . Not on file    Review of Systems: See HPI, otherwise negative ROS  Physical Exam: BP (!) 153/91   Pulse 78   Temp (!) 97.1 F (36.2 C) (Tympanic)   Resp 20   Ht 5\' 1"  (1.549 m)   Wt 235 lb (106.6 kg)   SpO2 97%   BMI 44.40 kg/m  General:   Alert,  pleasant and cooperative in NAD Head:  Normocephalic and atraumatic. Neck:  Supple; no masses or thyromegaly. Lungs:  Clear throughout to auscultation, normal respiratory effort.    Heart:  +S1, +S2, Regular rate and rhythm, No edema. Abdomen:  Soft, nontender and nondistended. Normal bowel sounds, without guarding, and without rebound.   Neurologic:  Alert and  oriented x4;  grossly normal neurologically.  Impression/Plan: Mark Terry. is here for an endoscopy and dilation  to be performed for  evaluation of dysphagia     Risks, benefits, limitations, and alternatives regarding endoscopy and dilation  have been reviewed with the patient.  Questions have been answered.  All parties agreeable.   Jonathon Bellows, MD  11/18/2017, 9:13 AM

## 2017-11-18 NOTE — Transfer of Care (Signed)
Immediate Anesthesia Transfer of Care Note  Patient: Mark Terry.  Procedure(s) Performed: ESOPHAGOGASTRODUODENOSCOPY (EGD) WITH PROPOFOL (N/A )  Patient Location: PACU  Anesthesia Type:General  Level of Consciousness: sedated  Airway & Oxygen Therapy: Patient Spontanous Breathing and Patient connected to nasal cannula oxygen  Post-op Assessment: Report given to RN and Post -op Vital signs reviewed and stable  Post vital signs: Reviewed and stable  Last Vitals:  Vitals Value Taken Time  BP 107/67 11/18/2017  9:37 AM  Temp 36.1 C 11/18/2017  9:37 AM  Pulse 85 11/18/2017  9:37 AM  Resp 11 11/18/2017  9:37 AM  SpO2 96 % 11/18/2017  9:37 AM  Vitals shown include unvalidated device data.  Last Pain:  Vitals:   11/18/17 0900  TempSrc: Tympanic  PainSc: 7          Complications: No apparent anesthesia complications

## 2017-11-18 NOTE — Anesthesia Preprocedure Evaluation (Signed)
Anesthesia Evaluation  Patient identified by MRN, date of birth, ID band Patient awake    Reviewed: Allergy & Precautions, H&P , NPO status , Patient's Chart, lab work & pertinent test results, reviewed documented beta blocker date and time   History of Anesthesia Complications (+) PONV and history of anesthetic complications  Airway Mallampati: II  TM Distance: >3 FB Neck ROM: full    Dental  (+) Dental Advidsory Given, Teeth Intact Permanent bridge on the right top:   Pulmonary neg shortness of breath, sleep apnea and Continuous Positive Airway Pressure Ventilation , COPD, neg recent URI, former smoker,           Cardiovascular Exercise Tolerance: Good hypertension, (-) angina+ CAD, + CABG and +CHF  (-) Past MI and (-) Cardiac Stents (-) dysrhythmias (-) Valvular Problems/Murmurs     Neuro/Psych PSYCHIATRIC DISORDERS Anxiety negative neurological ROS     GI/Hepatic Neg liver ROS, hiatal hernia, GERD  ,  Endo/Other  diabetes, Oral Hypoglycemic AgentsMorbid obesity  Renal/GU negative Renal ROS  negative genitourinary   Musculoskeletal   Abdominal   Peds  Hematology negative hematology ROS (+)   Anesthesia Other Findings Past Medical History: No date: Anemia No date: Bruises easily No date: Diabetes mellitus without complication ([facility]) No date: H/O esophagogastroduodenoscopy No date: H/O hiatal hernia No date: History of blood transfusion     Comment:  no reaction noted No date: History of bronchitis     Comment:  last time a couple of yrs ago No date: History of gastric ulcer No date: History of MRSA infection No date: Leg cramps     Comment:  takes quinine prn No date: Melanoma ([place]) No date: Panic attacks No date: Pneumonia     Comment:  hx of last time a couple of yrs ago No date: PONV (postoperative nausea and vomiting)   Reproductive/Obstetrics negative OB ROS                              Anesthesia Physical Anesthesia Plan  ASA: III  Anesthesia Plan: General   Post-op Pain Management:    Induction: Intravenous  PONV Risk Score and Plan: 3 and Propofol infusion  Airway Management Planned: Nasal Cannula  Additional Equipment:   Intra-op Plan:   Post-operative Plan:   Informed Consent: I have reviewed the patients History and Physical, chart, labs and discussed the procedure including the risks, benefits and alternatives for the proposed anesthesia with the patient or authorized representative who has indicated his/her understanding and acceptance.   Dental Advisory Given  Plan Discussed with: Anesthesiologist, CRNA and Surgeon  Anesthesia Plan Comments:         Anesthesia Quick Evaluation

## 2017-11-18 NOTE — Anesthesia Post-op Follow-up Note (Signed)
Anesthesia QCDR form completed.        

## 2017-11-18 NOTE — Anesthesia Postprocedure Evaluation (Signed)
Anesthesia Post Note  Patient: Mark Terry.  Procedure(s) Performed: ESOPHAGOGASTRODUODENOSCOPY (EGD) WITH PROPOFOL (N/A )  Patient location during evaluation: Endoscopy Anesthesia Type: General Level of consciousness: awake and alert Pain management: pain level controlled Vital Signs Assessment: post-procedure vital signs reviewed and stable Respiratory status: spontaneous breathing, nonlabored ventilation, respiratory function stable and patient connected to nasal cannula oxygen Cardiovascular status: blood pressure returned to baseline and stable Postop Assessment: no apparent nausea or vomiting Anesthetic complications: no     Last Vitals:  Vitals:   11/18/17 0957 11/18/17 1007  BP: 126/78 132/82  Pulse: 85 91  Resp: (!) 25 17  Temp:    SpO2: 100% 98%    Last Pain:  Vitals:   11/18/17 0900  TempSrc: Tympanic  PainSc: 7                  Martha Clan

## 2017-11-18 NOTE — Telephone Encounter (Signed)
Received procedure clearance from Dr. Caryn Section on 11/15/17.   Scanned to chart.

## 2017-11-18 NOTE — Op Note (Signed)
[facility] Patient Name: Jeiden Daughtridge Procedure Date: 11/18/2017 9:25 AM MRN: [id number] Account #: [id number] Date of Birth: 1935-03-20 Admit Type: Outpatient Age: 83 Room: [facility] ENDO ROOM 4 Gender: Male Note Status: Finalized Procedure:            Upper GI endoscopy Indications:          Dysphagia Providers:            Jonathon Bellows MD, MD Referring MD:         Kirstie Peri. Caryn Section, MD (Referring MD) Medicines:            Monitored Anesthesia Care Complications:        No immediate complications. Procedure:            Pre-Anesthesia Assessment:                       - Prior to the procedure, a History and Physical was                        performed, and patient medications, allergies and                        sensitivities were reviewed. The patient's tolerance of                        previous anesthesia was reviewed.                       - The risks and benefits of the procedure and the                        sedation options and risks were discussed with the                        patient. All questions were answered and informed                        consent was obtained.                       - ASA Grade Assessment: III - A patient with severe                        systemic disease.                       After obtaining informed consent, the endoscope was                        passed under direct vision. Throughout the procedure,                        the patient's blood pressure, pulse, and oxygen                        saturations were monitored continuously. The Endoscope                        was introduced through the mouth, and advanced to the  third part of duodenum. The upper GI endoscopy was                        accomplished with ease. The patient tolerated the                        procedure well. Findings:      The examined duodenum was normal.      The stomach was normal.      The examined  esophagus was normal. Biopsies were taken with a cold       forceps for histology.      The cardia and gastric fundus were normal on retroflexion. Impression:           - Normal examined duodenum.                       - Normal stomach.                       - Normal esophagus. Biopsied. Recommendation:       - Await pathology results.                       - Discharge patient to home (with escort).                       - Resume previous diet.                       - Continue present medications.                       - Return to my office in 6 weeks.                       - If dysphagia persists may need manometry Procedure Code(s):    --- Professional ---                       [phone], Esophagogastroduodenoscopy, flexible, transoral;                        with biopsy, single or multiple Diagnosis Code(s):    --- Professional ---                       R13.10, Dysphagia, unspecified CPT copyright 2017 American Medical Association. All rights reserved. The codes documented in this report are preliminary and upon coder review may  be revised to meet current compliance requirements. Jonathon Bellows, MD Jonathon Bellows MD, MD 11/18/2017 9:34:47 AM This report has been signed electronically. Number of Addenda: 0 Note Initiated On: 11/18/2017 9:25 AM      [facility]

## 2017-11-19 ENCOUNTER — Encounter: Payer: Self-pay | Admitting: Gastroenterology

## 2017-11-19 LAB — SURGICAL PATHOLOGY

## 2017-11-20 ENCOUNTER — Encounter: Payer: Self-pay | Admitting: Gastroenterology

## 2017-11-29 DIAGNOSIS — M5412 Radiculopathy, cervical region: Secondary | ICD-10-CM | POA: Diagnosis not present

## 2017-11-29 DIAGNOSIS — M5416 Radiculopathy, lumbar region: Secondary | ICD-10-CM | POA: Diagnosis not present

## 2017-11-29 DIAGNOSIS — M5136 Other intervertebral disc degeneration, lumbar region: Secondary | ICD-10-CM | POA: Diagnosis not present

## 2017-11-29 DIAGNOSIS — M5030 Other cervical disc degeneration, unspecified cervical region: Secondary | ICD-10-CM | POA: Diagnosis not present

## 2017-11-29 DIAGNOSIS — M4802 Spinal stenosis, cervical region: Secondary | ICD-10-CM | POA: Diagnosis not present

## 2017-12-07 ENCOUNTER — Other Ambulatory Visit: Payer: Self-pay | Admitting: Family Medicine

## 2017-12-26 DIAGNOSIS — M5416 Radiculopathy, lumbar region: Secondary | ICD-10-CM | POA: Diagnosis not present

## 2017-12-26 DIAGNOSIS — M5136 Other intervertebral disc degeneration, lumbar region: Secondary | ICD-10-CM | POA: Diagnosis not present

## 2017-12-26 DIAGNOSIS — M48062 Spinal stenosis, lumbar region with neurogenic claudication: Secondary | ICD-10-CM | POA: Diagnosis not present

## 2018-01-03 ENCOUNTER — Ambulatory Visit (INDEPENDENT_AMBULATORY_CARE_PROVIDER_SITE_OTHER): Payer: Medicare Other

## 2018-01-03 VITALS — BP 118/58 | HR 97 | Temp 98.7°F | Ht 61.0 in | Wt 230.4 lb

## 2018-01-03 DIAGNOSIS — Z0000 Encounter for general adult medical examination without abnormal findings: Secondary | ICD-10-CM | POA: Diagnosis not present

## 2018-01-03 NOTE — Progress Notes (Signed)
Subjective:   Mark Terry. is a 83 y.o. male who presents for Medicare Annual/Subsequent preventive examination.  Review of Systems:  N/A  Cardiac Risk Factors include: advanced age (>91men, >40 women);male gender;hypertension;diabetes mellitus;dyslipidemia;obesity (BMI >30kg/m2)     Objective:    Vitals: BP (!) 118/58 (BP Location: Right Arm)   Pulse 97   Temp 98.7 F (37.1 C) (Oral)   Ht 5\' 1"  (1.549 m)   Wt 230 lb 6.4 oz (104.5 kg)   BMI 43.53 kg/m   Body mass index is 43.53 kg/m.  Advanced Directives 01/03/2018 11/18/2017 01/08/2017 11/05/2015 11/04/2015 11/04/2015 11/04/2015  Does Patient Have a Medical Advance Directive? Yes Yes Yes - (No Data) Yes Yes  Type of Advance Directive [place];Living will - [place];Living will - - - -  Does patient want to make changes to medical advance directive? - - - Yes - information given - Yes - information given -  Copy of [place] in Chart? No - copy requested - No - copy requested - - No - copy requested -  Would patient like information on creating a medical advance directive? - - - Yes - Educational materials given - No - patient declined information Yes Higher education careers adviser given    Tobacco Social History   Tobacco Use  Smoking Status Former Smoker  . Packs/day: 1.00  . Years: 25.00  . Pack years: 25.00  . Types: Cigarettes  . Last attempt to quit: 07/09/1968  . Years since quitting: 49.5  Smokeless Tobacco Never Used  Tobacco Comment   quit at age 39     Counseling given: Not Answered Comment: quit at age 82   Clinical Intake:  Pre-visit preparation completed: Yes  Pain : 0-10 Pain Score: 6  Pain Type: Chronic pain Pain Location: Back Pain Orientation: Upper, Mid, Lower Pain Descriptors / Indicators: Throbbing Pain Frequency: Constant     Nutritional Status: BMI > 30  Obese Nutritional Risks: None Diabetes: Yes(type 2) CBG done?: No Did pt.  bring in CBG monitor from home?: No  How often do you need to have someone help you when you read instructions, pamphlets, or other written materials from your doctor or pharmacy?: 1 - Never  Interpreter Needed?: No  Information entered by :: [facility], lPN  Past Medical History:  Diagnosis Date  . Anemia   . Bruises easily   . Diabetes mellitus without complication ([place])   . H/O esophagogastroduodenoscopy   . H/O hiatal hernia   . History of blood transfusion    no reaction noted  . History of bronchitis    last time a couple of yrs ago  . History of gastric ulcer   . History of MRSA infection   . Leg cramps    takes quinine prn  . Melanoma ([place])   . Panic attacks   . Pneumonia    hx of last time a couple of yrs ago  . PONV (postoperative nausea and vomiting)    Past Surgical History:  Procedure Laterality Date  . ANKLE FUSION Right 11/04/2015   Procedure: ARTHRODESIS ANKLE;  Surgeon: Samara Deist, DPM;  Location: [facility] ORS;  Service: Podiatry;  Laterality: Right;  . BACK SURGERY     x 4, last lumb. laminectomy Dr. Annette Stable 2008 cervical fusion x 2  . bilateral cataract surgery    . CHOLECYSTECTOMY  1970  . COLONOSCOPY    . CORONARY ANGIOPLASTY    . CORONARY  ARTERY BYPASS GRAFT  2000    3 vessels  . ESOPHAGOGASTRODUODENOSCOPY (EGD) WITH PROPOFOL N/A 11/18/2017   Procedure: ESOPHAGOGASTRODUODENOSCOPY (EGD) WITH PROPOFOL;  Surgeon: Jonathon Bellows, MD;  Location: [facility] ENDOSCOPY;  Service: Gastroenterology;  Laterality: N/A;  . JOINT REPLACEMENT    . NECK SURGERY     x 2  . prostate laser surgery     x 2  . REPLACEMENT TOTAL KNEE BILATERAL  '86 and 2000  . REVERSE SHOULDER ARTHROPLASTY  05/30/2012   Procedure: REVERSE SHOULDER ARTHROPLASTY;  Surgeon: Augustin Schooling, MD;  Location: [place];  Service: Orthopedics;  Laterality: Right;  right reverse shoulder arthroplasty  . TOTAL HIP ARTHROPLASTY Right 2012   [place]   Family History  Problem Relation Age of Onset  .  Alzheimer's disease Mother   . Heart attack Father   . Bipolar disorder Brother   . Kidney disease Neg Hx   . Prostate cancer Neg Hx    Social History   Socioeconomic History  . Marital status: Divorced    Spouse name: Not on file  . Number of children: 2  . Years of education: 29  . Highest education level: 12th grade  Occupational History  . Occupation: Retired  Scientific laboratory technician  . Financial resource strain: Not hard at all  . Food insecurity:    Worry: Never true    Inability: Never true  . Transportation needs:    Medical: No    Non-medical: No  Tobacco Use  . Smoking status: Former Smoker    Packs/day: 1.00    Years: 25.00    Pack years: 25.00    Types: Cigarettes    Last attempt to quit: 07/09/1968    Years since quitting: 49.5  . Smokeless tobacco: Never Used  . Tobacco comment: quit at age 31  Substance and Sexual Activity  . Alcohol use: No    Alcohol/week: 0.0 oz  . Drug use: No  . Sexual activity: Never  Lifestyle  . Physical activity:    Days per week: Not on file    Minutes per session: Not on file  . Stress: Not at all  Relationships  . Social connections:    Talks on phone: Not on file    Gets together: Not on file    Attends religious service: Not on file    Active member of club or organization: Not on file    Attends meetings of clubs or organizations: Not on file    Relationship status: Not on file  Other Topics Concern  . Not on file  Social History Narrative  . Not on file    Outpatient Encounter Medications as of 01/03/2018  Medication Sig  . aspirin 81 MG tablet Take 81 mg by mouth daily.  Marland Kitchen doxazosin (CARDURA) 2 MG tablet Take 1 tablet (2 mg total) by mouth daily.  . empagliflozin (JARDIANCE) 25 MG TABS tablet Take 25 mg by mouth daily.  . fluticasone (FLONASE) 50 MCG/ACT nasal spray USE ONE TO TWO SPRAYS IN EACH NOSTRIL EVERY DAY  . furosemide (LASIX) 40 MG tablet Take 1 tablet (40 mg total) by mouth daily.  Marland Kitchen gabapentin (NEURONTIN)  300 MG capsule Take 1 capsule (300 mg total) by mouth 2 (two) times daily.  Marland Kitchen glimepiride (AMARYL) 1 MG tablet Take 1 tablet (1 mg total) by mouth daily.  Marland Kitchen glucose blood (ACCU-CHEK COMPACT PLUS) test strip CHECK BLOOD SUGAR DAILY FOR TYPE 2 DIABETES e11.9  . HYDROcodone-acetaminophen (NORCO/VICODIN) 5-325 MG tablet 1  tablet 2 (two) times daily.   Marland Kitchen ibuprofen (ADVIL,MOTRIN) 200 MG tablet Take 200 mg by mouth every 6 (six) hours as needed.  . metFORMIN (GLUCOPHAGE-XR) 500 MG 24 hr tablet Take 1 tablet (500 mg total) by mouth daily.  . metoprolol tartrate (LOPRESSOR) 25 MG tablet TAKE 1 TABLET BY MOUTH TWO  TIMES DAILY  . mirabegron ER (MYRBETRIQ) 50 MG TB24 tablet Take 1 tablet (50 mg total) by mouth daily.  . Multiple Vitamin (MULTIVITAMIN WITH MINERALS) TABS Take 1 tablet by mouth daily.  Marland Kitchen omeprazole (PRILOSEC) 40 MG capsule Take 1 capsule (40 mg total) by mouth daily.  Marland Kitchen oxybutynin (DITROPAN XL) 15 MG 24 hr tablet Take 1 tablet (15 mg total) by mouth at bedtime.  . potassium chloride SA (K-DUR,KLOR-CON) 20 MEQ tablet Take 1 tablet (20 mEq total) by mouth daily.  . simvastatin (ZOCOR) 40 MG tablet Take 1 tablet (40 mg total) by mouth at bedtime.  . traZODone (DESYREL) 100 MG tablet Take 0.5-1 tablets (50-100 mg total) by mouth at bedtime as needed for sleep.  . fluticasone (FLONASE) 50 MCG/ACT nasal spray USE 1 TO 2 SPRAYS IN EACH  NOSTRIL EVERY DAY  . loratadine (CLARITIN) 10 MG tablet Take 10 mg by mouth daily.  . meloxicam (MOBIC) 15 MG tablet Take 1 tablet (15 mg total) by mouth daily as needed. (Patient not taking: Reported on 01/03/2018)   No facility-administered encounter medications on file as of 01/03/2018.     Activities of Daily Living In your present state of health, do you have any difficulty performing the following activities: 01/03/2018 01/08/2017  Hearing? Tempie Donning  Comment Wears bilateral hearing aids.  hearing aids  Vision? N N  Difficulty concentrating or making decisions? N Y    Walking or climbing stairs? Y Y  Comment Due to back pain.  -  Dressing or bathing? N Y  Comment - puttin on socks  Doing errands, shopping? N N  Preparing Food and eating ? N N  Using the Toilet? N N  In the past six months, have you accidently leaked urine? Tempie Donning  Comment Currently on medication for leakage. wears protection  Do you have problems with loss of bowel control? N N  Managing your Medications? N N  Managing your Finances? N N  Housekeeping or managing your Housekeeping? N Y  Some recent data might be hidden    Patient Care Team: Birdie Sons, MD as PCP - General (Family Medicine) Yolonda Kida, MD as Consulting Physician (Cardiology) Erby Pian, MD as Referring Physician (Specialist) Sharlet Salina, MD as Referring Physician (Physical Medicine and Rehabilitation) Jannet Mantis, MD as Consulting Physician (Dermatology)   Assessment:   This is a routine wellness examination for [place].  Exercise Activities and Dietary recommendations Current Exercise Habits: Structured exercise class, Type of exercise: Other - see comments(reclining eliptical machine), Time (Minutes): 30, Frequency (Times/Week): 5, Weekly Exercise (Minutes/Week): 150, Intensity: Mild, Exercise limited by: orthopedic condition(s)  Goals    . DIET - INCREASE WATER INTAKE     Recommend increasing water intake to 6-8 glasses a water a day.        Fall Risk Fall Risk  01/03/2018 01/08/2017 06/15/2016 05/19/2015 02/16/2015  Falls in the past year? Yes Yes Yes Yes No  Number falls in past yr: 2 or more 1 2 or more 2 or more -  Injury with Fall? No No - No -  Risk Factor Category  - High Fall Risk  High Fall Risk - -  Follow up Falls prevention discussed Falls prevention discussed Falls prevention discussed Falls evaluation completed -   Is the patient's home free of loose throw rugs in walkways, pet beds, electrical cords, etc?   yes      Grab bars in the bathroom? yes       Handrails on the stairs?   no      Adequate lighting?   yes  Timed Get Up and Go Performed: N/A  Depression Screen PHQ 2/9 Scores 01/03/2018 01/08/2017 06/15/2016 05/19/2015  PHQ - 2 Score 0 0 1 0  PHQ- 9 Score - - - 3    Cognitive Function: Pt declined screening today.      6CIT Screen 01/08/2017  What Year? 0 points  What month? 0 points  What time? 0 points  Count back from 20 0 points  Months in reverse 0 points  Repeat phrase 2 points  Total Score 2    Immunization History  Administered Date(s) Administered  . Influenza, High Dose Seasonal PF 04/23/2015, 03/31/2016, 05/02/2017  . Influenza-Unspecified 03/09/2014  . Pneumococcal Conjugate-13 03/16/2014  . Pneumococcal Polysaccharide-23 06/12/2000, 01/27/2009  . Td 02/25/2001  . Tdap 05/14/2012  . Zoster 07/16/2006    Qualifies for Shingles Vaccine? Due for Shingles vaccine. Declined my offer to administer today. Education has been provided regarding the importance of this vaccine. Pt has been advised to call her insurance company to determine her out of pocket expense. Advised she may also receive this vaccine at her local pharmacy or Health Dept. Verbalized acceptance and understanding.  Screening Tests Health Maintenance  Topic Date Due  . OPHTHALMOLOGY EXAM  08/09/2017  . FOOT EXAM  01/08/2018  . URINE MICROALBUMIN  01/08/2018  . INFLUENZA VACCINE  02/06/2018  . HEMOGLOBIN A1C  03/13/2018  . COLONOSCOPY  06/03/2018  . TETANUS/TDAP  05/14/2022  . PNA vac Low Risk Adult  Completed   Cancer Screenings: Lung: Low Dose CT Chest recommended if Age 20-80 years, 30 pack-year currently smoking OR have quit w/in 15years. Patient does not qualify. Colorectal: Up to date  Additional Screenings:  Hepatitis C Screening: N/A      Plan:  I have personally reviewed and addressed the Medicare Annual Wellness questionnaire and have noted the following in the patient's chart:  A. Medical and social history B. Use of alcohol,  tobacco or illicit drugs  C. Current medications and supplements D. Functional ability and status E.  Nutritional status F.  Physical activity G. Advance directives H. List of other physicians I.  Hospitalizations, surgeries, and ER visits in previous 12 months J.  [place] such as hearing and vision if needed, cognitive and depression L. Referrals and appointments - none  In addition, I have reviewed and discussed with patient certain preventive protocols, quality metrics, and best practice recommendations. A written personalized care plan for preventive services as well as general preventive health recommendations were provided to patient.  See attached scanned questionnaire for additional information.   Signed,  Fabio Neighbors, LPN Nurse Health Advisor   Nurse Recommendations: None.

## 2018-01-03 NOTE — Patient Instructions (Addendum)
Mark Terry , Thank you for taking time to come for your Medicare Wellness Visit. I appreciate your ongoing commitment to your health goals. Please review the following plan we discussed and let me know if I can assist you in the future.   Screening recommendations/referrals: Colonoscopy: Up to date Recommended yearly ophthalmology/optometry visit for glaucoma screening and checkup Recommended yearly dental visit for hygiene and checkup  Vaccinations: Influenza vaccine: Up to date Pneumococcal vaccine: Up to date Tdap vaccine: Up to date Shingles vaccine: Pt declines today.     Advanced directives: Please bring a copy of your POA (Power of Attorney) and/or Living Will to your next appointment.   Conditions/risks identified: Obesity- recommend increasing water intake to 6-8 glasses a water a day.   Next appointment: 01/13/18 with Dr Caryn Section. Pt declined scheduling an AWV for 2020.  Preventive Care 65 Years and Older, Male Preventive care refers to lifestyle choices and visits with your health care provider that can promote health and wellness. What does preventive care include?  A yearly physical exam. This is also called an annual well check.  Dental exams once or twice a year.  Routine eye exams. Ask your health care provider how often you should have your eyes checked.  Personal lifestyle choices, including:  Daily care of your teeth and gums.  Regular physical activity.  Eating a healthy diet.  Avoiding tobacco and drug use.  Limiting alcohol use.  Practicing safe sex.  Taking low doses of aspirin every day.  Taking vitamin and mineral supplements as recommended by your health care provider. What happens during an annual well check? The services and screenings done by your health care provider during your annual well check will depend on your age, overall health, lifestyle risk factors, and family history of disease. Counseling  Your health care provider may ask you  questions about your:  Alcohol use.  Tobacco use.  Drug use.  Emotional well-being.  Home and relationship well-being.  Sexual activity.  Eating habits.  History of falls.  Memory and ability to understand (cognition).  Work and work Statistician. Screening  You may have the following tests or measurements:  Height, weight, and BMI.  Blood pressure.  Lipid and cholesterol levels. These may be checked every 5 years, or more frequently if you are over 98 years old.  Skin check.  Lung cancer screening. You may have this screening every year starting at age 47 if you have a 30-pack-year history of smoking and currently smoke or have quit within the past 15 years.  Fecal occult blood test (FOBT) of the stool. You may have this test every year starting at age 25.  Flexible sigmoidoscopy or colonoscopy. You may have a sigmoidoscopy every 5 years or a colonoscopy every 10 years starting at age 39.  Prostate cancer screening. Recommendations will vary depending on your family history and other risks.  Hepatitis C blood test.  Hepatitis B blood test.  Sexually transmitted disease (STD) testing.  Diabetes screening. This is done by checking your blood sugar (glucose) after you have not eaten for a while (fasting). You may have this done every 1-3 years.  Abdominal aortic aneurysm (AAA) screening. You may need this if you are a current or former smoker.  Osteoporosis. You may be screened starting at age 65 if you are at high risk. Talk with your health care provider about your test results, treatment options, and if necessary, the need for more tests. Vaccines  Your health care provider  may recommend certain vaccines, such as:  Influenza vaccine. This is recommended every year.  Tetanus, diphtheria, and acellular pertussis (Tdap, Td) vaccine. You may need a Td booster every 10 years.  Zoster vaccine. You may need this after age 79.  Pneumococcal 13-valent conjugate  (PCV13) vaccine. One dose is recommended after age 24.  Pneumococcal polysaccharide (PPSV23) vaccine. One dose is recommended after age 10. Talk to your health care provider about which screenings and vaccines you need and how often you need them. This information is not intended to replace advice given to you by your health care provider. Make sure you discuss any questions you have with your health care provider. Document Released: 07/22/2015 Document Revised: 03/14/2016 Document Reviewed: 04/26/2015 Elsevier Interactive Patient Education  2017 [employer] Prevention in the Home Falls can cause injuries. They can happen to people of all ages. There are many things you can do to make your home safe and to help prevent falls. What can I do on the outside of my home?  Regularly fix the edges of walkways and driveways and fix any cracks.  Remove anything that might make you trip as you walk through a door, such as a raised step or threshold.  Trim any bushes or trees on the path to your home.  Use bright outdoor lighting.  Clear any walking paths of anything that might make someone trip, such as rocks or tools.  Regularly check to see if handrails are loose or broken. Make sure that both sides of any steps have handrails.  Any raised decks and porches should have guardrails on the edges.  Have any leaves, snow, or ice cleared regularly.  Use sand or salt on walking paths during winter.  Clean up any spills in your garage right away. This includes oil or grease spills. What can I do in the bathroom?  Use night lights.  Install grab bars by the toilet and in the tub and shower. Do not use towel bars as grab bars.  Use non-skid mats or decals in the tub or shower.  If you need to sit down in the shower, use a plastic, non-slip stool.  Keep the floor dry. Clean up any water that spills on the floor as soon as it happens.  Remove soap buildup in the tub or shower  regularly.  Attach bath mats securely with double-sided non-slip rug tape.  Do not have throw rugs and other things on the floor that can make you trip. What can I do in the bedroom?  Use night lights.  Make sure that you have a light by your bed that is easy to reach.  Do not use any sheets or blankets that are too big for your bed. They should not hang down onto the floor.  Have a firm chair that has side arms. You can use this for support while you get dressed.  Do not have throw rugs and other things on the floor that can make you trip. What can I do in the kitchen?  Clean up any spills right away.  Avoid walking on wet floors.  Keep items that you use a lot in easy-to-reach places.  If you need to reach something above you, use a strong step stool that has a grab bar.  Keep electrical cords out of the way.  Do not use floor polish or wax that makes floors slippery. If you must use wax, use non-skid floor wax.  Do not have throw rugs  and other things on the floor that can make you trip. What can I do with my stairs?  Do not leave any items on the stairs.  Make sure that there are handrails on both sides of the stairs and use them. Fix handrails that are broken or loose. Make sure that handrails are as long as the stairways.  Check any carpeting to make sure that it is firmly attached to the stairs. Fix any carpet that is loose or worn.  Avoid having throw rugs at the top or bottom of the stairs. If you do have throw rugs, attach them to the floor with carpet tape.  Make sure that you have a light switch at the top of the stairs and the bottom of the stairs. If you do not have them, ask someone to add them for you. What else can I do to help prevent falls?  Wear shoes that:  Do not have high heels.  Have rubber bottoms.  Are comfortable and fit you well.  Are closed at the toe. Do not wear sandals.  If you use a stepladder:  Make sure that it is fully  opened. Do not climb a closed stepladder.  Make sure that both sides of the stepladder are locked into place.  Ask someone to hold it for you, if possible.  Clearly mark and make sure that you can see:  Any grab bars or handrails.  First and last steps.  Where the edge of each step is.  Use tools that help you move around (mobility aids) if they are needed. These include:  Canes.  Walkers.  Scooters.  Crutches.  Turn on the lights when you go into a dark area. Replace any light bulbs as soon as they burn out.  Set up your furniture so you have a clear path. Avoid moving your furniture around.  If any of your floors are uneven, fix them.  If there are any pets around you, be aware of where they are.  Review your medicines with your doctor. Some medicines can make you feel dizzy. This can increase your chance of falling. Ask your doctor what other things that you can do to help prevent falls. This information is not intended to replace advice given to you by your health care provider. Make sure you discuss any questions you have with your health care provider. Document Released: 04/21/2009 Document Revised: 12/01/2015 Document Reviewed: 07/30/2014 Elsevier Interactive Patient Education  2017 [employer].

## 2018-01-13 ENCOUNTER — Encounter: Payer: Self-pay | Admitting: Family Medicine

## 2018-01-13 ENCOUNTER — Ambulatory Visit (INDEPENDENT_AMBULATORY_CARE_PROVIDER_SITE_OTHER): Payer: Medicare Other | Admitting: Family Medicine

## 2018-01-13 VITALS — BP 118/68 | HR 68 | Temp 98.1°F | Resp 16 | Wt 234.0 lb

## 2018-01-13 DIAGNOSIS — I10 Essential (primary) hypertension: Secondary | ICD-10-CM | POA: Diagnosis not present

## 2018-01-13 DIAGNOSIS — E7800 Pure hypercholesterolemia, unspecified: Secondary | ICD-10-CM | POA: Diagnosis not present

## 2018-01-13 DIAGNOSIS — E1122 Type 2 diabetes mellitus with diabetic chronic kidney disease: Secondary | ICD-10-CM

## 2018-01-13 LAB — POCT GLYCOSYLATED HEMOGLOBIN (HGB A1C): Hemoglobin A1C: 8.1 % — AB (ref 4.0–5.6)

## 2018-01-13 NOTE — Progress Notes (Signed)
Patient: Mark Terry. Male    DOB: 10-27-1934   83 y.o.   MRN: [id number] Visit Date: 01/13/2018  Today's Provider: [name], MD   Chief Complaint  Patient presents with  . Diabetes  . Hypertension  . Hyperlipidemia   Subjective:    HPI    Diabetes Mellitus Type II, Follow-up:   Lab Results  Component Value Date   HGBA1C 7.9 09/10/2017   HGBA1C 8.9 05/13/2017   HGBA1C 8.6 01/08/2017   Last seen for diabetes 4 months ago.  Management since then includes no changes. He reports excellent compliance with treatment. He is not having side effects.  Current symptoms include polyuria and have been stable. Home blood sugar records: fasting range: mid 100's  Episodes of hypoglycemia? no   Current Insulin Regimen: none Most Recent Eye Exam: 08/09/2016 Weight trend: stable  ------------------------------------------------------------------------   Hypertension, follow-up:  BP Readings from Last 3 Encounters:  01/03/18 (!) 118/58  11/18/17 132/82  11/14/17 126/71    He was last seen for hypertension 4 months ago.  BP at that visit was 120/84. Management since that visit includes none He reports excellent compliance with treatment. He is not having side effects.  He is exercising. He is adherent to low salt diet.   Outside blood pressures are 120's/ 80's. He is experiencing none.  Patient denies chest pain.   Cardiovascular risk factors include advanced age (older than 46 for men, 53 for women), diabetes mellitus, dyslipidemia and hypertension.  Use of agents associated with hypertension: none.   ------------------------------------------------------------------------    Lipid/Cholesterol, Follow-up:   Last seen for this 5 months ago.  Management since that visit includes no changes.  Last Lipid Panel:    Component Value Date/Time   CHOL 140 09/03/2016 0919   TRIG 186 (H) 09/03/2016 0919   HDL 46 09/03/2016 0919   CHOLHDL 3.0  09/03/2016 0919   LDLCALC 57 09/03/2016 0919    He reports excellent compliance with treatment. He is not having side effects.   Wt Readings from Last 3 Encounters:  01/03/18 230 lb 6.4 oz (104.5 kg)  11/18/17 235 lb (106.6 kg)  11/14/17 235 lb 3.2 oz (106.7 kg)    ------------------------------------------------------------------------       Allergies  Allergen Reactions  . Propoxyphene Anaphylaxis  . Codeine Sulfate     Patient denies  . Darvon [Propoxyphene Hcl] Other (See Comments)    Short of breath  . Demerol [Meperidine] Other (See Comments)    Short of breath  . Oxycodone Nausea Only     Current Outpatient Medications:  .  aspirin 81 MG tablet, Take 81 mg by mouth daily., Disp: , Rfl:  .  doxazosin (CARDURA) 2 MG tablet, Take 1 tablet (2 mg total) by mouth daily., Disp: 90 tablet, Rfl: 4 .  empagliflozin (JARDIANCE) 25 MG TABS tablet, Take 25 mg by mouth daily., Disp: 90 tablet, Rfl: 4 .  fluticasone (FLONASE) 50 MCG/ACT nasal spray, USE ONE TO TWO SPRAYS IN EACH NOSTRIL EVERY DAY, Disp: 16 g, Rfl: 2 .  fluticasone (FLONASE) 50 MCG/ACT nasal spray, USE 1 TO 2 SPRAYS IN EACH  NOSTRIL EVERY DAY, Disp: 48 g, Rfl: 5 .  furosemide (LASIX) 40 MG tablet, Take 1 tablet (40 mg total) by mouth daily., Disp: 90 tablet, Rfl: 4 .  gabapentin (NEURONTIN) 300 MG capsule, Take 1 capsule (300 mg total) by mouth 2 (two) times daily., Disp: 180 capsule, Rfl: 3 .  glimepiride (  AMARYL) 1 MG tablet, Take 1 tablet (1 mg total) by mouth daily., Disp: 90 tablet, Rfl: 4 .  glucose blood (ACCU-CHEK COMPACT PLUS) test strip, CHECK BLOOD SUGAR DAILY FOR TYPE 2 DIABETES e11.9, Disp: 100 each, Rfl: 3 .  HYDROcodone-acetaminophen (NORCO/VICODIN) 5-325 MG tablet, 1 tablet 2 (two) times daily. , Disp: , Rfl:  .  ibuprofen (ADVIL,MOTRIN) 200 MG tablet, Take 200 mg by mouth every 6 (six) hours as needed., Disp: , Rfl:  .  loratadine (CLARITIN) 10 MG tablet, Take 10 mg by mouth daily., Disp: , Rfl:    .  meloxicam (MOBIC) 15 MG tablet, Take 1 tablet (15 mg total) by mouth daily as needed. (Patient not taking: Reported on 01/03/2018), Disp: 90 tablet, Rfl: 3 .  metFORMIN (GLUCOPHAGE-XR) 500 MG 24 hr tablet, Take 1 tablet (500 mg total) by mouth daily., Disp: 3 tablet, Rfl: 0 .  metoprolol tartrate (LOPRESSOR) 25 MG tablet, TAKE 1 TABLET BY MOUTH TWO  TIMES DAILY, Disp: 180 tablet, Rfl: 4 .  mirabegron ER (MYRBETRIQ) 50 MG TB24 tablet, Take 1 tablet (50 mg total) by mouth daily., Disp: 90 tablet, Rfl: 3 .  Multiple Vitamin (MULTIVITAMIN WITH MINERALS) TABS, Take 1 tablet by mouth daily., Disp: , Rfl:  .  omeprazole (PRILOSEC) 40 MG capsule, Take 1 capsule (40 mg total) by mouth daily., Disp: 90 capsule, Rfl: 3 .  oxybutynin (DITROPAN XL) 15 MG 24 hr tablet, Take 1 tablet (15 mg total) by mouth at bedtime., Disp: 90 tablet, Rfl: 3 .  potassium chloride SA (K-DUR,KLOR-CON) 20 MEQ tablet, Take 1 tablet (20 mEq total) by mouth daily., Disp: 90 tablet, Rfl: 3 .  simvastatin (ZOCOR) 40 MG tablet, Take 1 tablet (40 mg total) by mouth at bedtime., Disp: 90 tablet, Rfl: 4 .  traZODone (DESYREL) 100 MG tablet, Take 0.5-1 tablets (50-100 mg total) by mouth at bedtime as needed for sleep., Disp: 90 tablet, Rfl: 3  Review of Systems  Constitutional: Negative.   Respiratory: Negative.   Cardiovascular: Positive for leg swelling. Negative for chest pain and palpitations.  Gastrointestinal: Negative.   Musculoskeletal: Positive for arthralgias, back pain, gait problem (Walks with a Sowle), myalgias and neck pain. Negative for joint swelling and neck stiffness.  Neurological: Positive for numbness (Left hand. ). Negative for dizziness, light-headedness and headaches.    Social History   Tobacco Use  . Smoking status: Former Smoker    Packs/day: 1.00    Years: 25.00    Pack years: 25.00    Types: Cigarettes    Last attempt to quit: 07/09/1968    Years since quitting: 49.5  . Smokeless tobacco: Never  Used  . Tobacco comment: quit at age 42  Substance Use Topics  . Alcohol use: No    Alcohol/week: 0.0 oz   Objective:   BP 118/68 (BP Location: Right Arm, Patient Position: Sitting, Cuff Size: Normal)   Pulse 68   Temp 98.1 F (36.7 C) (Oral)   Resp 16   Wt 234 lb (106.1 kg)   BMI 44.21 kg/m     Physical Exam  General Appearance:    Alert, cooperative, no distress, obese  Eyes:    PERRL, conjunctiva/corneas clear, EOM's intact       Lungs:     Clear to auscultation bilaterally, respirations unlabored  Heart:    Regular rate and rhythm  Neurologic:   Awake, alert, oriented x 3. No apparent focal neurological  defect.       Results for orders placed or performed in visit on 01/13/18  POCT glycosylated hemoglobin (Hb A1C)  Result Value Ref Range   Hemoglobin A1C 8.1 (A) 4.0 - 5.6 %   HbA1c POC (<> result, manual entry)  4.0 - 5.6 %   HbA1c, POC (prediabetic range)  5.7 - 6.4 %   HbA1c, POC (controlled diabetic range)  0.0 - 7.0 %       Assessment & Plan:     1. Hypercholesterolemia He is tolerating simvastatin well with no adverse effects.  anticipate checking lipids at next follow up    2. Type 2 diabetes mellitus with chronic kidney disease, without long-term current use of insulin, unspecified CKD stage (HCC) a1c up a bit, but doesn't tolerate higher doses of metformin.  - POCT glycosylated hemoglobin (Hb A1C)  3. Essential (primary) hypertension Well controlled.  Continue current medications.    Return in about 4 months (around 05/16/2018).        [name], MD  [facility]

## 2018-01-16 DIAGNOSIS — M5030 Other cervical disc degeneration, unspecified cervical region: Secondary | ICD-10-CM | POA: Diagnosis not present

## 2018-01-16 DIAGNOSIS — M4802 Spinal stenosis, cervical region: Secondary | ICD-10-CM | POA: Diagnosis not present

## 2018-01-16 DIAGNOSIS — M5412 Radiculopathy, cervical region: Secondary | ICD-10-CM | POA: Diagnosis not present

## 2018-01-21 DIAGNOSIS — M5416 Radiculopathy, lumbar region: Secondary | ICD-10-CM | POA: Diagnosis not present

## 2018-01-21 DIAGNOSIS — M5442 Lumbago with sciatica, left side: Secondary | ICD-10-CM | POA: Diagnosis not present

## 2018-01-21 DIAGNOSIS — M5441 Lumbago with sciatica, right side: Secondary | ICD-10-CM | POA: Diagnosis not present

## 2018-01-21 DIAGNOSIS — G8929 Other chronic pain: Secondary | ICD-10-CM | POA: Diagnosis not present

## 2018-01-22 ENCOUNTER — Other Ambulatory Visit: Payer: Self-pay | Admitting: Student

## 2018-01-22 DIAGNOSIS — G8929 Other chronic pain: Secondary | ICD-10-CM

## 2018-01-22 DIAGNOSIS — M5442 Lumbago with sciatica, left side: Principal | ICD-10-CM

## 2018-01-22 DIAGNOSIS — M5441 Lumbago with sciatica, right side: Principal | ICD-10-CM

## 2018-01-30 ENCOUNTER — Ambulatory Visit
Admission: RE | Admit: 2018-01-30 | Discharge: 2018-01-30 | Disposition: A | Discharge status: Home / Self Care | Payer: Medicare Other | Source: Ambulatory Visit | Attending: Student | Admitting: Student

## 2018-01-30 DIAGNOSIS — M5442 Lumbago with sciatica, left side: Secondary | ICD-10-CM

## 2018-01-30 DIAGNOSIS — M47816 Spondylosis without myelopathy or radiculopathy, lumbar region: Secondary | ICD-10-CM | POA: Diagnosis not present

## 2018-01-30 DIAGNOSIS — M5416 Radiculopathy, lumbar region: Secondary | ICD-10-CM | POA: Diagnosis present

## 2018-01-30 DIAGNOSIS — G8929 Other chronic pain: Secondary | ICD-10-CM | POA: Insufficient documentation

## 2018-01-30 DIAGNOSIS — M48061 Spinal stenosis, lumbar region without neurogenic claudication: Secondary | ICD-10-CM | POA: Diagnosis not present

## 2018-01-30 DIAGNOSIS — I700 Atherosclerosis of aorta: Secondary | ICD-10-CM | POA: Insufficient documentation

## 2018-01-30 DIAGNOSIS — M5441 Lumbago with sciatica, right side: Secondary | ICD-10-CM | POA: Insufficient documentation

## 2018-01-30 DIAGNOSIS — M5116 Intervertebral disc disorders with radiculopathy, lumbar region: Secondary | ICD-10-CM | POA: Insufficient documentation

## 2018-02-05 ENCOUNTER — Ambulatory Visit
Admission: RE | Admit: 2018-02-05 | Discharge: 2018-02-05 | Disposition: A | Discharge status: Home / Self Care | Payer: Medicare Other | Source: Ambulatory Visit | Attending: Student | Admitting: Student

## 2018-02-05 ENCOUNTER — Ambulatory Visit: Payer: Medicare Other

## 2018-02-05 DIAGNOSIS — M5441 Lumbago with sciatica, right side: Secondary | ICD-10-CM | POA: Insufficient documentation

## 2018-02-05 DIAGNOSIS — M5442 Lumbago with sciatica, left side: Secondary | ICD-10-CM | POA: Diagnosis not present

## 2018-02-05 DIAGNOSIS — M545 Low back pain: Secondary | ICD-10-CM | POA: Diagnosis not present

## 2018-02-05 DIAGNOSIS — M5136 Other intervertebral disc degeneration, lumbar region: Secondary | ICD-10-CM | POA: Insufficient documentation

## 2018-02-05 DIAGNOSIS — M48061 Spinal stenosis, lumbar region without neurogenic claudication: Secondary | ICD-10-CM | POA: Insufficient documentation

## 2018-02-05 DIAGNOSIS — G8929 Other chronic pain: Secondary | ICD-10-CM | POA: Diagnosis not present

## 2018-02-15 ENCOUNTER — Other Ambulatory Visit: Payer: Self-pay | Admitting: Family Medicine

## 2018-03-01 ENCOUNTER — Other Ambulatory Visit: Payer: Self-pay | Admitting: Family Medicine

## 2018-03-08 ENCOUNTER — Other Ambulatory Visit: Payer: Self-pay | Admitting: Family Medicine

## 2018-03-13 DIAGNOSIS — M961 Postlaminectomy syndrome, not elsewhere classified: Secondary | ICD-10-CM | POA: Diagnosis not present

## 2018-03-13 DIAGNOSIS — M5442 Lumbago with sciatica, left side: Secondary | ICD-10-CM | POA: Diagnosis not present

## 2018-03-13 DIAGNOSIS — M5441 Lumbago with sciatica, right side: Secondary | ICD-10-CM | POA: Diagnosis not present

## 2018-03-13 DIAGNOSIS — G8929 Other chronic pain: Secondary | ICD-10-CM | POA: Diagnosis not present

## 2018-03-19 DIAGNOSIS — G4733 Obstructive sleep apnea (adult) (pediatric): Secondary | ICD-10-CM | POA: Diagnosis not present

## 2018-03-19 DIAGNOSIS — Z9989 Dependence on other enabling machines and devices: Secondary | ICD-10-CM | POA: Diagnosis not present

## 2018-03-19 DIAGNOSIS — R0609 Other forms of dyspnea: Secondary | ICD-10-CM | POA: Diagnosis not present

## 2018-03-22 ENCOUNTER — Other Ambulatory Visit: Payer: Self-pay | Admitting: Family Medicine

## 2018-03-29 ENCOUNTER — Other Ambulatory Visit: Payer: Self-pay | Admitting: Family Medicine

## 2018-04-02 ENCOUNTER — Ambulatory Visit (INDEPENDENT_AMBULATORY_CARE_PROVIDER_SITE_OTHER): Payer: Medicare Other

## 2018-04-02 DIAGNOSIS — Z23 Encounter for immunization: Secondary | ICD-10-CM

## 2018-04-05 ENCOUNTER — Other Ambulatory Visit: Payer: Self-pay | Admitting: Urology

## 2018-04-05 ENCOUNTER — Other Ambulatory Visit: Payer: Self-pay | Admitting: Family Medicine

## 2018-04-05 DIAGNOSIS — I10 Essential (primary) hypertension: Secondary | ICD-10-CM

## 2018-04-09 ENCOUNTER — Other Ambulatory Visit: Payer: Self-pay | Admitting: Family Medicine

## 2018-04-10 ENCOUNTER — Encounter: Payer: Self-pay | Admitting: Student in an Organized Health Care Education/Training Program

## 2018-04-10 ENCOUNTER — Ambulatory Visit
Payer: Medicare Other | Attending: Student in an Organized Health Care Education/Training Program | Admitting: Student in an Organized Health Care Education/Training Program

## 2018-04-10 ENCOUNTER — Other Ambulatory Visit: Payer: Self-pay

## 2018-04-10 VITALS — BP 131/69 | HR 92 | Temp 98.0°F | Resp 18 | Ht 61.0 in | Wt 225.0 lb

## 2018-04-10 DIAGNOSIS — I5032 Chronic diastolic (congestive) heart failure: Secondary | ICD-10-CM | POA: Diagnosis not present

## 2018-04-10 DIAGNOSIS — G4733 Obstructive sleep apnea (adult) (pediatric): Secondary | ICD-10-CM | POA: Diagnosis not present

## 2018-04-10 DIAGNOSIS — I130 Hypertensive heart and chronic kidney disease with heart failure and stage 1 through stage 4 chronic kidney disease, or unspecified chronic kidney disease: Secondary | ICD-10-CM | POA: Insufficient documentation

## 2018-04-10 DIAGNOSIS — Z6841 Body Mass Index (BMI) 40.0 and over, adult: Secondary | ICD-10-CM | POA: Insufficient documentation

## 2018-04-10 DIAGNOSIS — M5136 Other intervertebral disc degeneration, lumbar region: Secondary | ICD-10-CM | POA: Insufficient documentation

## 2018-04-10 DIAGNOSIS — M5030 Other cervical disc degeneration, unspecified cervical region: Secondary | ICD-10-CM | POA: Diagnosis not present

## 2018-04-10 DIAGNOSIS — Z9889 Other specified postprocedural states: Secondary | ICD-10-CM

## 2018-04-10 DIAGNOSIS — G5600 Carpal tunnel syndrome, unspecified upper limb: Secondary | ICD-10-CM | POA: Diagnosis not present

## 2018-04-10 DIAGNOSIS — K219 Gastro-esophageal reflux disease without esophagitis: Secondary | ICD-10-CM | POA: Diagnosis not present

## 2018-04-10 DIAGNOSIS — I2510 Atherosclerotic heart disease of native coronary artery without angina pectoris: Secondary | ICD-10-CM | POA: Insufficient documentation

## 2018-04-10 DIAGNOSIS — Z79899 Other long term (current) drug therapy: Secondary | ICD-10-CM | POA: Insufficient documentation

## 2018-04-10 DIAGNOSIS — M5412 Radiculopathy, cervical region: Secondary | ICD-10-CM

## 2018-04-10 DIAGNOSIS — M4802 Spinal stenosis, cervical region: Secondary | ICD-10-CM

## 2018-04-10 DIAGNOSIS — G8929 Other chronic pain: Secondary | ICD-10-CM | POA: Insufficient documentation

## 2018-04-10 DIAGNOSIS — M1612 Unilateral primary osteoarthritis, left hip: Secondary | ICD-10-CM | POA: Diagnosis not present

## 2018-04-10 DIAGNOSIS — M48062 Spinal stenosis, lumbar region with neurogenic claudication: Secondary | ICD-10-CM | POA: Diagnosis not present

## 2018-04-10 DIAGNOSIS — M5416 Radiculopathy, lumbar region: Secondary | ICD-10-CM | POA: Diagnosis not present

## 2018-04-10 DIAGNOSIS — N189 Chronic kidney disease, unspecified: Secondary | ICD-10-CM | POA: Insufficient documentation

## 2018-04-10 DIAGNOSIS — M542 Cervicalgia: Secondary | ICD-10-CM

## 2018-04-10 DIAGNOSIS — M48061 Spinal stenosis, lumbar region without neurogenic claudication: Secondary | ICD-10-CM

## 2018-04-10 DIAGNOSIS — E1122 Type 2 diabetes mellitus with diabetic chronic kidney disease: Secondary | ICD-10-CM

## 2018-04-10 NOTE — Patient Instructions (Signed)
Moderate Conscious Sedation, Adult Sedation is the use of medicines to promote relaxation and relieve discomfort and anxiety. Moderate conscious sedation is a type of sedation. Under moderate conscious sedation, you are less alert than normal, but you are still able to respond to instructions, touch, or both. Moderate conscious sedation is used during short medical and dental procedures. It is milder than deep sedation, which is a type of sedation under which you cannot be easily woken up. It is also milder than general anesthesia, which is the use of medicines to make you unconscious. Moderate conscious sedation allows you to return to your regular activities sooner. Tell a health care provider about:  Any allergies you have.  All medicines you are taking, including vitamins, herbs, eye drops, creams, and over-the-counter medicines.  Use of steroids (by mouth or creams).  Any problems you or family members have had with sedatives and anesthetic medicines.  Any blood disorders you have.  Any surgeries you have had.  Any medical conditions you have, such as sleep apnea.  Whether you are pregnant or may be pregnant.  Any use of cigarettes, alcohol, marijuana, or street drugs. What are the risks? Generally, this is a safe procedure. However, problems may occur, including:  Getting too much medicine (oversedation).  Nausea.  Allergic reaction to medicines.  Trouble breathing. If this happens, a breathing tube may be used to help with breathing. It will be removed when you are awake and breathing on your own.  Heart trouble.  Lung trouble.  What happens before the procedure? Staying hydrated Follow instructions from your health care provider about hydration, which may include:  Up to 2 hours before the procedure - you may continue to drink clear liquids, such as water, clear fruit juice, black coffee, and plain tea.  Eating and drinking restrictions Follow instructions from  your health care provider about eating and drinking, which may include:  8 hours before the procedure - stop eating heavy meals or foods such as meat, fried foods, or fatty foods.  6 hours before the procedure - stop eating light meals or foods, such as toast or cereal.  6 hours before the procedure - stop drinking milk or drinks that contain milk.  2 hours before the procedure - stop drinking clear liquids.  Medicine  Ask your health care provider about:  Changing or stopping your regular medicines. This is especially important if you are taking diabetes medicines or blood thinners.  Taking medicines such as aspirin and ibuprofen. These medicines can thin your blood. Do not take these medicines before your procedure if your health care provider instructs you not to.  Tests and exams  You will have a physical exam.  You may have blood tests done to show: ? How well your kidneys and liver are working. ? How well your blood can clot. General instructions  Plan to have someone take you home from the hospital or clinic.  If you will be going home right after the procedure, plan to have someone with you for 24 hours. What happens during the procedure?  An IV tube will be inserted into one of your veins.  Medicine to help you relax (sedative) will be given through the IV tube.  The medical or dental procedure will be performed. What happens after the procedure?  Your blood pressure, heart rate, breathing rate, and blood oxygen level will be monitored often until the medicines you were given have worn off.  Do not drive for 24 hours.   This information is not intended to replace advice given to you by your health care provider. Make sure you discuss any questions you have with your health care provider. Document Released: 03/20/2001 Document Revised: 11/29/2015 Document Reviewed: 10/15/2015 [employer] Interactive Patient Education  2018 [employer] GENERAL RISKS AND  COMPLICATIONS  What are the risk, side effects and possible complications? Generally speaking, most procedures are safe.  However, with any procedure there are risks, side effects, and the possibility of complications.  The risks and complications are dependent upon the sites that are lesioned, or the type of nerve block to be performed.  The closer the procedure is to the spine, the more serious the risks are.  Great care is taken when placing the radio frequency needles, block needles or lesioning probes, but sometimes complications can occur. 1. Infection: Any time there is an injection through the skin, there is a risk of infection.  This is why sterile conditions are used for these blocks.  There are four possible types of infection. 1. Localized skin infection. 2. Central Nervous System Infection-This can be in the form of Meningitis, which can be deadly. 3. Epidural Infections-This can be in the form of an epidural abscess, which can cause pressure inside of the spine, causing compression of the spinal cord with subsequent paralysis. This would require an emergency surgery to decompress, and there are no guarantees that the patient would recover from the paralysis. 4. Discitis-This is an infection of the intervertebral discs.  It occurs in about 1% of discography procedures.  It is difficult to treat and it may lead to surgery.        2. Pain: the needles have to go through skin and soft tissues, will cause soreness.       3. Damage to internal structures:  The nerves to be lesioned may be near blood vessels or    other nerves which can be potentially damaged.       4. Bleeding: Bleeding is more common if the patient is taking blood thinners such as  aspirin, Coumadin, Ticiid, Plavix, etc., or if he/she have some genetic predisposition  such as hemophilia. Bleeding into the spinal canal can cause compression of the spinal  cord with subsequent paralysis.  This would require an emergency surgery  to  decompress and there are no guarantees that the patient would recover from the  paralysis.       5. Pneumothorax:  Puncturing of a lung is a possibility, every time a needle is introduced in  the area of the chest or upper back.  Pneumothorax refers to free air around the  collapsed lung(s), inside of the thoracic cavity (chest cavity).  Another two possible  complications related to a similar event would include: Hemothorax and Chylothorax.   These are variations of the Pneumothorax, where instead of air around the collapsed  lung(s), you may have blood or chyle, respectively.       6. Spinal headaches: They may occur with any procedures in the area of the spine.       7. Persistent CSF (Cerebro-Spinal Fluid) leakage: This is a rare problem, but may occur  with prolonged intrathecal or epidural catheters either due to the formation of a fistulous  track or a dural tear.       8. Nerve damage: By working so close to the spinal cord, there is always a possibility of  nerve damage, which could be as serious as a permanent spinal cord injury with    paralysis.       9. Death:  Although rare, severe deadly allergic reactions known as "Anaphylactic  reaction" can occur to any of the medications used.      10. Worsening of the symptoms:  We can always make thing worse.  What are the chances of something like this happening? Chances of any of this occuring are extremely low.  By statistics, you have more of a chance of getting killed in a motor vehicle accident: while driving to the hospital than any of the above occurring .  Nevertheless, you should be aware that they are possibilities.  In general, it is similar to taking a shower.  Everybody knows that you can slip, hit your head and get killed.  Does that mean that you should not shower again?  Nevertheless always keep in mind that statistics do not mean anything if you happen to be on the wrong side of them.  Even if a procedure has a 1 (one) in a 1,000,000  (million) chance of going wrong, it you happen to be that one..Also, keep in mind that by statistics, you have more of a chance of having something go wrong when taking medications.  Who should not have this procedure? If you are on a blood thinning medication (e.g. Coumadin, Plavix, see list of "Blood Thinners"), or if you have an active infection going on, you should not have the procedure.  If you are taking any blood thinners, please inform your physician.  How should I prepare for this procedure?  Do not eat or drink anything at least six hours prior to the procedure.  Bring a driver with you .  It cannot be a taxi.  Come accompanied by an adult that can drive you back, and that is strong enough to help you if your legs get weak or numb from the local anesthetic.  Take all of your medicines the morning of the procedure with just enough water to swallow them.  If you have diabetes, make sure that you are scheduled to have your procedure done first thing in the morning, whenever possible.  If you have diabetes, take only half of your insulin dose and notify our nurse that you have done so as soon as you arrive at the clinic.  If you are diabetic, but only take blood sugar pills (oral hypoglycemic), then do not take them on the morning of your procedure.  You may take them after you have had the procedure.  Do not take aspirin or any aspirin-containing medications, at least eleven (11) days prior to the procedure.  They may prolong bleeding.  Wear loose fitting clothing that may be easy to take off and that you would not mind if it got stained with Betadine or blood.  Do not wear any jewelry or perfume  Remove any nail coloring.  It will interfere with some of our monitoring equipment.  NOTE: Remember that this is not meant to be interpreted as a complete list of all possible complications.  Unforeseen problems may occur.  BLOOD THINNERS The following drugs contain aspirin or other  products, which can cause increased bleeding during surgery and should not be taken for 2 weeks prior to and 1 week after surgery.  If you should need take something for relief of minor pain, you may take acetaminophen which is found in Tylenol,m Datril, Anacin-3 and Panadol. It is not blood thinner. The products listed below are.  Do not take any of the products listed   below in addition to any listed on your instruction sheet.  A.P.C or A.P.C with Codeine Codeine Phosphate Capsules #3 Ibuprofen Ridaura  ABC compound Congesprin Imuran rimadil  Advil Cope Indocin Robaxisal  Alka-Seltzer Effervescent Pain Reliever and Antacid Coricidin or Coricidin-D  Indomethacin Rufen  Alka-Seltzer plus Cold Medicine Cosprin Ketoprofen S-A-C Tablets  Anacin Analgesic Tablets or Capsules Coumadin Korlgesic Salflex  Anacin Extra Strength Analgesic tablets or capsules CP-2 Tablets Lanoril Salicylate  Anaprox Cuprimine Capsules Levenox Salocol  Anexsia-D Dalteparin Magan Salsalate  Anodynos Darvon compound Magnesium Salicylate Sine-off  Ansaid Dasin Capsules Magsal Sodium Salicylate  Anturane Depen Capsules Marnal Soma  APF Arthritis pain formula Dewitt's Pills Measurin Stanback  Argesic Dia-Gesic Meclofenamic Sulfinpyrazone  Arthritis Bayer Timed Release Aspirin Diclofenac Meclomen Sulindac  Arthritis pain formula Anacin Dicumarol Medipren Supac  Analgesic (Safety coated) Arthralgen Diffunasal Mefanamic Suprofen  Arthritis Strength Bufferin Dihydrocodeine Mepro Compound Suprol  Arthropan liquid Dopirydamole Methcarbomol with Aspirin Synalgos  ASA tablets/Enseals Disalcid Micrainin Tagament  Ascriptin Doan's Midol Talwin  Ascriptin A/D Dolene Mobidin Tanderil  Ascriptin Extra Strength Dolobid Moblgesic Ticlid  Ascriptin with Codeine Doloprin or Doloprin with Codeine Momentum Tolectin  Asperbuf Duoprin Mono-gesic Trendar  Aspergum Duradyne Motrin or Motrin IB Triminicin  Aspirin plain, buffered or enteric  coated Durasal Myochrisine Trigesic  Aspirin Suppositories Easprin Nalfon Trillsate  Aspirin with Codeine Ecotrin Regular or Extra Strength Naprosyn Uracel  Atromid-S Efficin Naproxen Ursinus  Auranofin Capsules Elmiron Neocylate Vanquish  Axotal Emagrin Norgesic Verin  Azathioprine Empirin or Empirin with Codeine Normiflo Vitamin E  Azolid Emprazil Nuprin Voltaren  Bayer Aspirin plain, buffered or children's or timed BC Tablets or powders Encaprin Orgaran Warfarin Sodium  Buff-a-Comp Enoxaparin Orudis Zorpin  Buff-a-Comp with Codeine Equegesic Os-Cal-Gesic   Buffaprin Excedrin plain, buffered or Extra Strength Oxalid   Bufferin Arthritis Strength Feldene Oxphenbutazone   Bufferin plain or Extra Strength Feldene Capsules Oxycodone with Aspirin   Bufferin with Codeine Fenoprofen Fenoprofen Pabalate or Pabalate-SF   Buffets II Flogesic Panagesic   Buffinol plain or Extra Strength Florinal or Florinal with Codeine Panwarfarin   Buf-Tabs Flurbiprofen Penicillamine   Butalbital Compound Four-way cold tablets Penicillin   Butazolidin Fragmin Pepto-Bismol   Carbenicillin Geminisyn Percodan   Carna Arthritis Reliever Geopen Persantine   Carprofen Gold's salt Persistin   Chloramphenicol Goody's Phenylbutazone   Chloromycetin Haltrain Piroxlcam   Clmetidine heparin Plaquenil   Cllnoril Hyco-pap Ponstel   Clofibrate Hydroxy chloroquine Propoxyphen         Before stopping any of these medications, be sure to consult the physician who ordered them.  Some, such as Coumadin (Warfarin) are ordered to prevent or treat serious conditions such as "deep thrombosis", "pumonary embolisms", and other heart problems.  The amount of time that you may need off of the medication may also vary with the medication and the reason for which you were taking it.  If you are taking any of these medications, please make sure you notify your pain physician before you undergo any procedures.         Epidural  Steroid Injection Patient Information  Description: The epidural space surrounds the nerves as they exit the spinal cord.  In some patients, the nerves can be compressed and inflamed by a bulging disc or a tight spinal canal (spinal stenosis).  By injecting steroids into the epidural space, we can bring irritated nerves into direct contact with a potentially helpful medication.  These steroids act directly on the irritated nerves and can reduce swelling and inflammation  which often leads to decreased pain.  Epidural steroids may be injected anywhere along the spine and from the neck to the low back depending upon the location of your pain.   After numbing the skin with local anesthetic (like Novocaine), a small needle is passed into the epidural space slowly.  You may experience a sensation of pressure while this is being done.  The entire block usually last less than 10 minutes.  Conditions which may be treated by epidural steroids:   Low back and leg pain  Neck and arm pain  Spinal stenosis  Post-laminectomy syndrome  Herpes zoster (shingles) pain  Pain from compression fractures  Preparation for the injection:  1. Do not eat any solid food or dairy products within 8 hours of your appointment.  2. You may drink clear liquids up to 3 hours before appointment.  Clear liquids include water, black coffee, juice or soda.  No milk or cream please. 3. You may take your regular medication, including pain medications, with a sip of water before your appointment  Diabetics should hold regular insulin (if taken separately) and take 1/2 normal NPH dos the morning of the procedure.  Carry some sugar containing items with you to your appointment. 4. A driver must accompany you and be prepared to drive you home after your procedure.  5. Bring all your current medications with your. 6. An IV may be inserted and sedation may be given at the discretion of the physician.   7. A blood pressure cuff, EKG  and other monitors will often be applied during the procedure.  Some patients may need to have extra oxygen administered for a short period. 8. You will be asked to provide medical information, including your allergies, prior to the procedure.  We must know immediately if you are taking blood thinners (like Coumadin/Warfarin)  Or if you are allergic to IV iodine contrast (dye). We must know if you could possible be pregnant.  Possible side-effects:  Bleeding from needle site  Infection (rare, may require surgery)  Nerve injury (rare)  Numbness & tingling (temporary)  Difficulty urinating (rare, temporary)  Spinal headache ( a headache worse with upright posture)  Light -headedness (temporary)  Pain at injection site (several days)  Decreased blood pressure (temporary)  Weakness in arm/leg (temporary)  Pressure sensation in back/neck (temporary)  Call if you experience:  Fever/chills associated with headache or increased back/neck pain.  Headache worsened by an upright position.  New onset weakness or numbness of an extremity below the injection site  Hives or difficulty breathing (go to the emergency room)  Inflammation or drainage at the infection site  Severe back/neck pain  Any new symptoms which are concerning to you  Please note:  Although the local anesthetic injected can often make your back or neck feel good for several hours after the injection, the pain will likely return.  It takes 3-7 days for steroids to work in the epidural space.  You may not notice any pain relief for at least that one week.  If effective, we will often do a series of three injections spaced 3-6 weeks apart to maximally decrease your pain.  After the initial series, we generally will wait several months before considering a repeat injection of the same type.  If you have any questions, please call [phone] [facility]

## 2018-04-10 NOTE — Progress Notes (Signed)
Safety precautions to be maintained throughout the outpatient stay will include: orient to surroundings, keep bed in low position, maintain call bell within reach at all times, provide assistance with transfer out of bed and ambulation.  

## 2018-04-10 NOTE — Progress Notes (Signed)
Patient's Name: Mark Terry.  MRN: [id number]  Referring Provider: Meade Maw, MD  DOB: 10/07/1934  PCP: Birdie Sons, MD  DOS: 04/10/2018  Note by: Gillis Santa, MD  Service setting: Ambulatory outpatient  Specialty: Interventional Pain Management  Location: [facility] (AMB) Pain Management Facility  Visit type: Initial Patient Evaluation  Patient type: New Patient   Primary Reason(s) for Visit: Encounter for initial evaluation of one or more chronic problems (new to examiner) potentially causing chronic pain, and posing a threat to normal musculoskeletal function. (Level of risk: High) CC: Back Pain (mid) and Neck Pain  HPI  Mark Terry is a 83 y.o. year old, male patient, who comes today to see [place] for the first time for an initial evaluation of his chronic pain. He has Allergic rhinitis; Angina pectoris ([place]); Arthritis; Arthropathia; At risk for falling; Back pain with radiation; BPH (benign prostatic hyperplasia); CAD (coronary artery disease); Carpal tunnel syndrome; Cervical radiculitis; Cervical spinal stenosis; CHF (congestive heart failure) ([place]); Chronic gastritis; DDD (degenerative disc disease), cervical; Diabetes mellitus, type 2 ([place]); Essential (primary) hypertension; Fatty infiltration of liver; GERD (gastroesophageal reflux disease); History of colonic polyps; Hypercholesterolemia; Decreased potassium in the blood; Neuritis or radiculitis due to rupture of lumbar intervertebral disc; Morbid obesity ([place]); Cervical pain; Pilonidal cyst; Skin lesion; OSA on CPAP; Change in blood platelet count; Insomnia; Arthritis of ankle, right; Varus foot deformity, acquired; Arthritis of ankle or foot, degenerative; Urge incontinence; Nocturia; Urinary frequency; DDD (degenerative disc disease), lumbar; Primary osteoarthritis of left hip; Status post total replacement of right hip; Lumbar spondylosis; and Dysphagia on their problem list. Today he comes in for evaluation of his Back Pain (mid)  and Neck Pain  Pain Assessment: Location: Mid Back Radiating: up to neck; sometimes spasms in left leg when laying down Onset: More than a month ago("have had back pain as long as I can remember") Duration: Chronic pain Quality: Constant, Spasm Severity: 8 /10 (subjective, self-reported pain score)  Note: Reported level is compatible with observation.                         When using our objective Pain Scale, levels between 6 and 10/10 are said to belong in an emergency room, as it progressively worsens from a 6/10, described as severely limiting, requiring emergency care not usually available at an outpatient pain management facility. At a 6/10 level, communication becomes difficult and requires great effort. Assistance to reach the emergency department may be required. Facial flushing and profuse sweating along with potentially dangerous increases in heart rate and blood pressure will be evident. Effect on ADL: Pt states he cannot walk 10 steps without experiencing "excruciating pain". Timing: Constant Modifying factors: sitting BP: 131/69  HR: 92  Onset and Duration: Gradual and Present longer than 3 months Cause of pain: Unknown Severity: Getting worse, NAS-11 at its worse: 10/10 and NAS-11 at its best: 5/10 Timing: Morning and After a period of immobility Aggravating Factors: Climbing, Intercourse (sex), Lifiting, Prolonged standing, Squatting and Walking Alleviating Factors: Stretching, Medications and Sitting Associated Problems: Day-time cramps, Night-time cramps, Erectile dysfunction, Inability to control bladder (urine), Inability to control bowel, Numbness, Spasms, Tingling, Weakness, Pain that wakes patient up and Pain that does not allow patient to sleep Quality of Pain: Aching, Intermittent, Cramping, Deep, Horrible, Nagging, Shooting, Throbbing, Tingling and Uncomfortable Previous Examinations or Tests: CT scan, Endoscopy, MRI scan and X-rays Previous Treatments: Epidural  steroid injections and Narcotic medications  The  patient comes into the clinics today for the first time for a chronic pain management evaluation.   Patient is an 83 year old male with a chief complaint of axial low back pain that radiates down his left greater than right leg along with leg weakness.  Patient also has neck pain that radiates down bilateral arms.  Patient has a history of cervical spine surgery as well as lumbar decompression at L2 and L3 many years ago.  Patient has been receiving bilateral S1 transforaminal epidural steroid injections over the last 2 to 3 years which have been helpful.  He states that his last one which was approximately June 2019 was not as helpful.  He is having difficulty walking extended distance which is limited by pain and weakness.  It is impacting his sleeping.  Patient denies any bowel or bladder dysfunction.  Of note patient has a prior history of a CABG.  He is only on aspirin.  Patient is also morbidly obese.  Is also a type II diabetic.  He is referred here from Mark Terry for consideration of interventional therapies, specifically spinal cord stimulator trial.  Medication management to continue with primary care provider.  Historic Controlled Substance Pharmacotherapy Review   [place] PMP: Six (6) year initial data search conducted.              [place] Department of public safety, offender search: Editor, commissioning Information) Non-contributory Risk Assessment Profile: Aberrant behavior: None observed or detected today Risk factors for fatal opioid overdose: None identified today Fatal overdose hazard ratio (HR): Calculation deferred Non-fatal overdose hazard ratio (HR): Calculation deferred Risk of opioid abuse or dependence: 0.7-3.0% with doses ? 36 MME/day and 6.1-26% with doses ? 120 MME/day. Substance use disorder (SUD) risk level: See below Personal History of Substance Abuse (SUD-Substance use disorder):  Alcohol: Negative  Illegal Drugs: Negative  Rx  Drugs: Negative  ORT Risk Level calculation: Low Risk Opioid Risk Tool - 04/10/18 0815      Family History of Substance Abuse   Alcohol  Negative    Illegal Drugs  Negative    Rx Drugs  Negative      Personal History of Substance Abuse   Alcohol  Negative    Illegal Drugs  Negative    Rx Drugs  Negative      Total Score   Opioid Risk Tool Scoring  0    Opioid Risk Interpretation  Low Risk      ORT Scoring interpretation table:  Score <3 = Low Risk for SUD  Score between 4-7 = Moderate Risk for SUD  Score >8 = High Risk for Opioid Abuse   PHQ-2 Depression Scale:  Total score: 0  PHQ-2 Scoring interpretation table: (Score and probability of major depressive disorder)  Score 0 = No depression  Score 1 = 15.4% Probability  Score 2 = 21.1% Probability  Score 3 = 38.4% Probability  Score 4 = 45.5% Probability  Score 5 = 56.4% Probability  Score 6 = 78.6% Probability   PHQ-9 Depression Scale:  Total score: 0  PHQ-9 Scoring interpretation table:  Score 0-4 = No depression  Score 5-9 = Mild depression  Score 10-14 = Moderate depression  Score 15-19 = Moderately severe depression  Score 20-27 = Severe depression (2.4 times higher risk of SUD and 2.89 times higher risk of overuse)   Meds   Current Outpatient Medications:  .  ACCU-CHEK COMPACT PLUS test strip, CHECK BLOOD SUGAR DAILY FOR TYPE 2 DIABETES, Disp: 100 each, Rfl:  4 .  aspirin 81 MG tablet, Take 81 mg by mouth daily., Disp: , Rfl:  .  doxazosin (CARDURA) 2 MG tablet, TAKE 1 TABLET BY MOUTH  DAILY, Disp: 90 tablet, Rfl: 4 .  fluticasone (FLONASE) 50 MCG/ACT nasal spray, USE 1 TO 2 SPRAYS IN EACH  NOSTRIL EVERY DAY, Disp: 48 g, Rfl: 5 .  furosemide (LASIX) 40 MG tablet, TAKE 1 TABLET BY MOUTH  DAILY, Disp: 90 tablet, Rfl: 4 .  gabapentin (NEURONTIN) 300 MG capsule, TAKE 1 CAPSULE BY MOUTH TWO TIMES DAILY, Disp: 180 capsule, Rfl: 3 .  glimepiride (AMARYL) 1 MG tablet, TAKE 1 TABLET BY MOUTH  DAILY, Disp: 90 tablet,  Rfl: 4 .  HYDROcodone-acetaminophen (NORCO/VICODIN) 5-325 MG tablet, 1 tablet 2 (two) times daily. , Disp: , Rfl:  .  ibuprofen (ADVIL,MOTRIN) 200 MG tablet, Take 200 mg by mouth every 6 (six) hours as needed., Disp: , Rfl:  .  JARDIANCE 25 MG TABS tablet, TAKE 1 TABLET BY MOUTH  DAILY, Disp: 90 tablet, Rfl: 4 .  loratadine (CLARITIN) 10 MG tablet, Take 10 mg by mouth daily., Disp: , Rfl:  .  meloxicam (MOBIC) 15 MG tablet, Take 1 tablet (15 mg total) by mouth daily as needed., Disp: 90 tablet, Rfl: 3 .  metFORMIN (GLUCOPHAGE-XR) 500 MG 24 hr tablet, Take 1 tablet (500 mg total) by mouth daily., Disp: 3 tablet, Rfl: 0 .  metoprolol tartrate (LOPRESSOR) 25 MG tablet, TAKE 1 TABLET BY MOUTH TWO  TIMES DAILY, Disp: 180 tablet, Rfl: 4 .  mirabegron ER (MYRBETRIQ) 50 MG TB24 tablet, Take 50 mg by mouth daily., Disp: , Rfl:  .  mirtazapine (REMERON) 7.5 MG tablet, Take 7.5 mg by mouth at bedtime., Disp: , Rfl:  .  Multiple Vitamin (MULTIVITAMIN WITH MINERALS) TABS, Take 1 tablet by mouth daily., Disp: , Rfl:  .  omeprazole (PRILOSEC) 40 MG capsule, Take 1 capsule (40 mg total) by mouth daily., Disp: 90 capsule, Rfl: 3 .  oxybutynin (DITROPAN XL) 15 MG 24 hr tablet, Take 1 tablet (15 mg total) by mouth at bedtime., Disp: 90 tablet, Rfl: 3 .  potassium chloride SA (K-DUR,KLOR-CON) 20 MEQ tablet, TAKE 1 TABLET BY MOUTH  DAILY, Disp: 90 tablet, Rfl: 3 .  simvastatin (ZOCOR) 40 MG tablet, TAKE 1 TABLET BY MOUTH AT  BEDTIME, Disp: 90 tablet, Rfl: 4 .  traZODone (DESYREL) 100 MG tablet, TAKE 0.5-1 TABLET BY MOUTH  AT BEDTIME AS NEEDED FOR  SLEEP., Disp: 90 tablet, Rfl: 4  Imaging Review     Lumbosacral Imaging: Lumbar MR wo contrast:  Results for orders placed during the hospital encounter of 02/05/18  MR LUMBAR SPINE WO CONTRAST   Narrative CLINICAL DATA:  Chronic low back pain which radiates into both legs, worse on the right. Prior lumbar surgeries.  EXAM: MRI LUMBAR SPINE WITHOUT  CONTRAST  TECHNIQUE: Multiplanar, multisequence MR imaging of the lumbar spine was performed. No intravenous contrast was administered.  COMPARISON:  Lumbar spine CT 01/30/2018 and MRI 01/01/2014  FINDINGS: Segmentation: Rudimentary ribs at T12 and mild sacralization of L5 on the left, consistent with the numbering used on the prior CT and MRI.  Alignment: Mild upper lumbar dextroscoliosis. Trace retrolisthesis from T12-L1-L3-4, unchanged.  Vertebrae: No fracture or suspicious osseous lesion. Disc desiccation and severe disc space narrowing from T12-L1 to L4-5 with associated degenerative endplate changes. Mild degenerative endplate edema at M5-7. Scattered vertebral hemangiomas.  Conus medullaris and cauda equina: Conus extends to the L1 level. Conus  and cauda equina appear normal.  Paraspinal and other soft tissues: Postsurgical changes in the posterior lumbar soft tissues with paraspinal muscle atrophy. No fluid collection. 2 cm T2 hyperintense lesion in the lower pole of the left kidney, possibly a cyst but incompletely evaluated.  Disc levels:  T11-12: Only imaged sagittally. Circumferential disc bulging, endplate spurring, and moderate right greater than left facet arthrosis result in borderline spinal stenosis and mild right neural foraminal stenosis, similar to the prior MRI and without cord compression.  T12-L1: Circumferential disc osteophyte complex and moderate facet and ligamentum flavum hypertrophy result in mild spinal stenosis, left greater than right lateral recess stenosis, and moderate right and severe left neural foraminal stenosis, stable to minimally progressed from the prior MRI.  L1-2: Prior right laminectomy. Circumferential disc osteophyte complex and mild-to-moderate facet hypertrophy result in mild bilateral lateral recess stenosis and moderate left neural foraminal stenosis, unchanged from the prior MRI.  L2-3: Prior laminectomies.  Circumferential disc osteophyte complex and facet hypertrophy result in mild residual spinal stenosis and moderate to severe left greater than right neural foraminal stenosis, unchanged from the prior MRI.  L3-4: Prior laminectomies. Circumferential disc osteophyte complex and severe facet hypertrophy result in severe bilateral neural foraminal stenosis without significant spinal stenosis, unchanged from the prior MRI.  L4-5: Prior posterior decompression. Circumferential disc osteophyte complex and moderate right and mild left facet hypertrophy result in severe right greater than left neural foraminal stenosis, mild spinal stenosis, and bilateral lateral recess stenosis, unchanged from the prior MRI.  L5-S1: Mild disc bulging, endplate spurring, and mild right and severe left facet arthrosis result in mild bilateral neural foraminal stenosis without spinal stenosis, unchanged from the prior MRI.  IMPRESSION: 1. Severe diffuse lumbar disc and facet degeneration, not significantly changed from 2015. 2. Severe multilevel neural foraminal stenosis. 3. Posterior decompression at multiple levels with residual spinal and lateral recess stenosis as above.   Electronically Signed   By: Logan Bores M.D.   On: 02/05/2018 16:06     Lumbar MR w/wo contrast:  Results for orders placed during the hospital encounter of 12/30/03  MR Lumbar Spine W Wo Contrast   Narrative Clinical Data:  Low back pain radiating into bilateral legs, left greater than right.  History of two previous lumbar spine surgeries.  MRI OF THE LUMBAR SPINE WITH AND WITHOUT CONTRAST  Technique:  Multiplanar, multisequence images of the lumbar spine performed before and following the administration of 19 cc intravenous Multihance.  Findings: Comparison plain films performed on 12/30/03 and MRI from [facility] dated 10/01/01. Six non-rib bearing lumbar-type vertebrae are identified on plain films performed  today and previous x-rays from [place] and [facility].  However for the purposes of remaining consistent with previous MRI, the first non-rib bearing vertebra will be designated transitional vertebra (between T12 and L1) then numbering L1-5 and then S-1.  Mild apex right upper lumbar scoliosis is relatively unchanged.  Conus medullaris is grossly unremarkable terminating at L-1.  Moderate severe disc desiccation, disc space narrowing and spondylosis throughout the lumbar spine is unchanged.  Scattered hemangioma within vertebral bodies are noted.  Moderate degenerative end plate changes at majority of levels also present.   T12-transitional vertebra:  Stable minimal disc bulge noted with minimal biforaminal narrowing.  Transitional vertebra-L-1:  Stable moderate diffuse disc bulge with associated posterior osteophytic spurring, and mild facet overgrowth, left greater than right, and mild ligamentum flavum hypertrophy contributes to mild biforaminal/lateral recess narrowing.   L1-2:  Stable  moderate left paracentral disc protrusion noted with underlying broad based disc bulge creates mass effect on the thecal sac contributing to moderate central spinal narrowing with mild facet arthropathy, ligamentum flavum hypertrophy, and posterior osteophytic spurring.  Mild to moderate left and mild right neuroforaminal narrowing is present. Mild to moderate lateral recess narrowing is noted.  These findings are relatively unchanged from the last study.  L2-3:  Moderate to large diffuse disc protrusion which leads to moderate central spinal, biforaminal, and bilateral lateral recess narrowing.  These findings are slightly increased since the last study.  L3-4:  Moderate diffuse disc protrusion and associated osteophytic spurring contributes to moderate biforaminal narrowing unchanged from the last study.  L4-5:  Evidence of left hemilaminectomy is noted.  Moderate to large diffuse disc protrusion, more focal in central  region causes mass effect on the thecal sac, mild central spinal stenosis and moderate lateral recess/biforaminal narrowing.  This is relatively unchanged from the last study.  L5-S1:  Mild diffuse disc bulge with mild biforaminal narrowing unchanged. No gross abnormal areas of enhancement are noted except for areas of previous surgery consistent with granulation/scar tissue.  IMPRESSION Please note the numbering system as described above. There are six non-rib bearing lumbar-type vertebrae when compared to the plain films but keeping with labeling from MRI from [place] dated 2003, this designates the lower five lumbar vertebrae as L-1 through L-5 and transitional vertebra between T-12 and L-1.  Please correlate levels before intervention.  Moderate to severe multilevel degenerative disc disease, spondylosis, diffuse disc protrusion, and mild facet arthropathy/ligamentum flavum hypertrophy, all relatively stable except for increased findings at T2-3. These findings contribute to the following: Transitional vertebra-L1:  Mild biforaminal and lateral recess narrowing, left greater than right.  Eccentric disc protrusion in the biforaminal and left lateral regions.  At L1-2:  With moderate to large left paracentral disc protrusion - moderate central spinal, mild to moderate lateral recess, and mild biforaminal narrowing.   At L2-3:  With moderate to large diffuse disc protrusion, moderate central spinal, lateral recess, and biforaminal narrowing.  These findings have increased since the last study.  At L3-4:  With moderate to large diffuse disc bulge, moderate biforaminal narrowing.  At L4-5:  With moderate to large diffuse disc bulge, moderate biforaminal/lateral recess narrowing.  Post-operative changes at this level.  At L5-S1:  Mild biforaminal narrowing.  Provider: Weyman Rodney   Lumbar CT wo contrast:  Results for orders placed during the hospital encounter of 01/30/18  CT LUMBAR SPINE WO CONTRAST    Narrative CLINICAL DATA:  Chronic low back pain. Remote lumbar surgery. Decreasing effectiveness of injection therapy.  EXAM: CT LUMBAR SPINE WITHOUT CONTRAST  TECHNIQUE: Multidetector CT imaging of the lumbar spine was performed without intravenous contrast administration. Multiplanar CT image reconstructions were also generated.  COMPARISON:  Lumbar spine MRI 01/01/2014  FINDINGS: Segmentation: The numbering used on the prior MRI will be continued on this study, with the lowest fully formed intervertebral disc space designated L5-S1. There is mildly transitional lumbosacral anatomy with partial sacralization of L5 on the left. The ribs at T12 are rudimentary.  Alignment: Mild upper lumbar dextroscoliosis. Trace multilevel retrolisthesis in the upper lumbar spine, unchanged.  Vertebrae: No acute fracture or destructive osseous process. Chronic right L5 pars defect. Severe disc space narrowing from T11-12 to L4-5 with associated vacuum disc phenomenon and bulky endplate osteophyte formation. Possible degenerative partial fusion across the L1-2 disc space.  Paraspinal and other soft tissues: Aortic atherosclerosis without aneurysm.  Disc levels:  T11-12: Circumferential disc osteophyte complex and moderate right greater than left facet arthrosis result in borderline spinal stenosis and mild right neural foraminal stenosis, similar to the prior MRI.  T12-L1: Circumferential disc osteophyte complex and mild right and moderate left facet arthrosis result in mild spinal stenosis, asymmetric left lateral recess stenosis, and moderate right and severe left neural foraminal stenosis, unchanged.  L1-2: Prior right laminectomy. Circumferential disc osteophyte complex results in moderate left neural foraminal stenosis without spinal stenosis, unchanged.  L2-3: Prior laminectomies. Circumferential disc osteophyte complex and facet hypertrophy result in moderate to severe  bilateral neural foraminal stenosis and mild spinal stenosis, unchanged.  L3-4: Prior posterior decompression. Circumferential disc osteophyte complex and facet hypertrophy result in moderate to severe bilateral neural foraminal stenosis without significant spinal stenosis, unchanged.  L4-5: Prior posterior decompression. Circumferential disc osteophyte complex and facet hypertrophy result in severe right and moderate to severe left neural foraminal stenosis and mild spinal stenosis, unchanged.  L5-S1: Mild disc bulging, endplate spurring, and mild right and severe left facet arthrosis result in mild bilateral neural foraminal stenosis without spinal stenosis, unchanged.  IMPRESSION: 1. Severe diffuse lumbar disc degeneration, not significantly changed from the prior MRI. 2. Severe multilevel bilateral neural foraminal stenosis and mild spinal stenosis as above. 3. No acute osseous abnormality. 4.  Aortic Atherosclerosis (ICD10-I70.0).   Electronically Signed   By: Logan Bores M.D.   On: 01/30/2018 11:40    Lumbar DG (Complete) 4+V:  Results for orders placed during the hospital encounter of 12/30/03  DG Lumbar Spine Complete   Narrative Clinical Data:   Back pain.  Question stenosis.  DIAGNOSTIC LUMBAR SPINE, COMPLETE - 12/30/03  Six non rib bearing lumbar vertebrae are seen which probably represents transitional (lumbarized) S1 vertebra.  Generalized degenerative disk space narrowing is seen throughout the lumbar spine with relative sparing of S1-2.  Posterior spondylosis is seen from L1-2 through L4-5, most marked at L3-4 and L4-5.  Lumbar spine MRI or CT are advised for further evaluation of probable central canal stenosis, as a result.  Slight dextrorotary scoliosis of the lumbar spine is seen with advanced bilateral facet degenerative joint disease maximal at the right greater than left L5-S1 and S1-2 levels.  Ectatic normal caliber, moderately calcified abdominal aorta is  seen.   IMPRESSION 1.  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 2.  Slight dextroscoliosis with generalized degenerative disk disease with relative sparing at S1-2. 3.  Multilevel posterior spondylosis, as described. (See findings). 4.  Facet degenerative joint disease, maximal at L5-S1 and S1-2.  Provider: Kate Sable    Complexity Note: Imaging results reviewed. Results shared with Mr. Burak, using Layman's terms.                         ROS  Cardiovascular: Heart trouble, Daily Aspirin intake, High blood pressure, Heart surgery, Blood thinners:  Antiplatelet and Needs antibiotics prior to dental procedures Pulmonary or Respiratory: Lung problems, Difficulty blowing air out (Emphysema), Snoring , Coughing up mucus (Bronchitis) and Temporary stoppage of breathing during sleep Neurological: Curved spine Review of Past Neurological Studies: No results found for this or any previous visit. Psychological-Psychiatric: Prone to panicking and Difficulty sleeping and or falling asleep Gastrointestinal: Vomiting blood (Ulcers), Heartburn due to stomach pushing into lungs (Hiatal hernia) and Reflux or heatburn Genitourinary: Peeing blood Hematological: Brusing easily Endocrine: diabetes Rheumatologic: Joint aches and or swelling due to excess  weight (Osteoarthritis) Musculoskeletal: Negative for myasthenia gravis, muscular dystrophy, multiple sclerosis or malignant hyperthermia Work History: Retired  Allergies  Mr. Halteman is allergic to propoxyphene; codeine sulfate; darvon [propoxyphene hcl]; demerol [meperidine]; and oxycodone.  Laboratory Chemistry  Inflammation Markers (CRP: Acute Phase) (ESR: Chronic Phase) No results found for: CRP, ESRSEDRATE, LATICACIDVEN                       Rheumatology Markers No results found for: RF, ANA, LABURIC, URICUR, LYMEIGGIGMAB, LYMEABIGMQN, HLAB27                      Renal Function  Markers Lab Results  Component Value Date   BUN 23 05/13/2017   CREATININE 1.70 (H) 05/13/2017   BCR 14 05/13/2017   GFRAA 42 (L) 09/03/2016   GFRNONAA 36 (L) 09/03/2016                             Hepatic Function Markers Lab Results  Component Value Date   AST 36 09/03/2016   ALT 42 09/03/2016   ALBUMIN 4.3 09/03/2016   ALKPHOS 74 09/03/2016                        Electrolytes Lab Results  Component Value Date   NA 141 05/13/2017   K 4.7 05/13/2017   CL 105 05/13/2017   CALCIUM 9.6 05/13/2017   PHOS 3.0 05/13/2017                        Neuropathy Markers Lab Results  Component Value Date   VITAMINB12 728 10/10/2015   HGBA1C 8.1 (A) 01/13/2018                        CNS Tests No results found for: COLORCSF, APPEARCSF, RBCCOUNTCSF, WBCCSF, POLYSCSF, LYMPHSCSF, EOSCSF, PROTEINCSF, GLUCCSF, JCVIRUS, CSFOLI, IGGCSF                      Bone Pathology Markers Lab Results  Component Value Date   VD25OH 47.6 10/10/2015                         Coagulation Parameters Lab Results  Component Value Date   INR 0.99 05/27/2012   LABPROT 13.0 05/27/2012   APTT 31 05/27/2012   PLT 124 (L) 09/03/2016                        Cardiovascular Markers Lab Results  Component Value Date   TROPONINI <0.03 11/05/2015   HGB 12.1 (L) 09/03/2016   HCT 37.3 (L) 09/03/2016                         CA Markers No results found for: CEA, CA125, LABCA2                      Note: Lab results reviewed.  [place]  Drug: Mr. Lumley  reports that he does not use drugs. Alcohol:  reports that he does not drink alcohol. Tobacco:  reports that he quit smoking about 49 years ago. His smoking use included cigarettes. He has a 25.00 pack-year smoking history. He has never used smokeless tobacco. Medical:  has a past medical history of Anemia, Bruises easily,  Diabetes mellitus without complication ([place]), H/O esophagogastroduodenoscopy, H/O hiatal hernia, History of blood transfusion, History of  bronchitis, History of gastric ulcer, History of MRSA infection, Leg cramps, Melanoma ([place]), Panic attacks, Pneumonia, and PONV (postoperative nausea and vomiting). Family: family history includes Alzheimer's disease in his mother; Bipolar disorder in his brother; Heart attack in his father.  Past Surgical History:  Procedure Laterality Date  . ANKLE FUSION Right 11/04/2015   Procedure: ARTHRODESIS ANKLE;  Surgeon: Samara Deist, DPM;  Location: [facility];  Service: Podiatry;  Laterality: Right;  . BACK SURGERY     x 4, last lumb. laminectomy Dr. Annette Stable 2008 cervical fusion x 2  . bilateral cataract surgery    . CHOLECYSTECTOMY  1970  . COLONOSCOPY    . CORONARY ANGIOPLASTY    . CORONARY ARTERY BYPASS GRAFT  2000    3 vessels  . ESOPHAGOGASTRODUODENOSCOPY (EGD) WITH PROPOFOL N/A 11/18/2017   Procedure: ESOPHAGOGASTRODUODENOSCOPY (EGD) WITH PROPOFOL;  Surgeon: Jonathon Bellows, MD;  Location: [facility];  Service: Gastroenterology;  Laterality: N/A;  . JOINT REPLACEMENT    . NECK SURGERY     x 2  . prostate laser surgery     x 2  . REPLACEMENT TOTAL KNEE BILATERAL  '86 and 2000  . REVERSE SHOULDER ARTHROPLASTY  05/30/2012   Procedure: REVERSE SHOULDER ARTHROPLASTY;  Surgeon: Augustin Schooling, MD;  Location: [facility] [place] [facility];  Service: Orthopedics;  Laterality: Right;  right reverse shoulder arthroplasty  . TOTAL HIP ARTHROPLASTY Right 2012   [place]   Active Ambulatory Problems    Diagnosis Date Noted  . Allergic rhinitis 12/20/2014  . Angina pectoris ([place]) 12/20/2014  . Arthritis 12/20/2014  . Arthropathia 03/25/1996  . At risk for falling 12/20/2014  . Back pain with radiation 12/20/2014  . BPH (benign prostatic hyperplasia) 12/20/2014  . CAD (coronary artery disease) 10/05/1999  . Carpal tunnel syndrome 12/20/2014  . Cervical radiculitis 04/01/2014  . Cervical spinal stenosis 04/28/2014  . CHF (congestive heart failure) ([place]) 12/20/2014  . Chronic gastritis 12/21/2010  . DDD (degenerative  disc disease), cervical 04/28/2014  . Diabetes mellitus, type 2 ([place]) 04/22/2003  . Essential (primary) hypertension 09/06/1998  . Fatty infiltration of liver 12/20/2014  . GERD (gastroesophageal reflux disease) 08/10/1994  . History of colonic polyps 12/20/2014  . Hypercholesterolemia 03/25/1996  . Decreased potassium in the blood 12/20/2014  . Neuritis or radiculitis due to rupture of lumbar intervertebral disc 12/21/2013  . Morbid obesity ([place]) 09/21/1994  . Cervical pain 12/20/2014  . Pilonidal cyst 09/05/2009  . Skin lesion 12/20/2014  . OSA on CPAP 05/07/2005  . Change in blood platelet count 12/20/2014  . Insomnia   . Arthritis of ankle, right 12/20/2014  . Varus foot deformity, acquired 12/20/2014  . Arthritis of ankle or foot, degenerative 11/04/2015  . Urge incontinence 12/15/2015  . Nocturia 12/15/2015  . Urinary frequency 12/15/2015  . DDD (degenerative disc disease), lumbar 12/21/2013  . Primary osteoarthritis of left hip 04/30/2016  . Status post total replacement of right hip 04/25/2015  . Lumbar spondylosis 12/21/2013  . Dysphagia 09/10/2017   Resolved Ambulatory Problems    Diagnosis Date Noted  . No Resolved Ambulatory Problems   Past Medical History:  Diagnosis Date  . Anemia   . Bruises easily   . Diabetes mellitus without complication ([place])   . H/O esophagogastroduodenoscopy   . H/O hiatal hernia   . History of blood transfusion   . History of bronchitis   . History of gastric ulcer   .  History of MRSA infection   . Leg cramps   . Melanoma ([place])   . Panic attacks   . Pneumonia   . PONV (postoperative nausea and vomiting)    Constitutional Exam  General appearance: alert, cooperative and oriented Vitals:   04/10/18 0757  BP: 131/69  Pulse: 92  Resp: 18  Temp: 98 F (36.7 C)  TempSrc: Oral  SpO2: 100%  Weight: 225 lb (102.1 kg)  Height: _0  (1.549 m)   BMI Assessment: Estimated body mass index is 42.51 kg/m as calculated from the  following:   Height as of this encounter: _1  (1.549 m).   Weight as of this encounter: 225 lb (102.1 kg).  BMI interpretation table: BMI level Category Range association with higher incidence of chronic pain  <18 kg/m2 Underweight   18.5-24.9 kg/m2 Ideal body weight   25-29.9 kg/m2 Overweight Increased incidence by 20%  30-34.9 kg/m2 Obese (Class I) Increased incidence by 68%  35-39.9 kg/m2 Severe obesity (Class II) Increased incidence by 136%  >40 kg/m2 Extreme obesity (Class III) Increased incidence by 254%   Patient's current BMI Ideal Body weight  Body mass index is 42.51 kg/m. Ideal body weight: 52.3 kg (115 lb 4.8 oz) Adjusted ideal body weight: 72.2 kg (159 lb 2.9 oz)   BMI Readings from Last 4 Encounters:  04/10/18 42.51 kg/m  01/13/18 44.21 kg/m  01/03/18 43.53 kg/m  11/18/17 44.40 kg/m   Wt Readings from Last 4 Encounters:  04/10/18 225 lb (102.1 kg)  01/13/18 234 lb (106.1 kg)  01/03/18 230 lb 6.4 oz (104.5 kg)  11/18/17 235 lb (106.6 kg)  Psych/Mental status: Alert, oriented x 3 (person, place, & time)       Eyes: PERLA Respiratory: No evidence of acute respiratory distress  Cervical Spine Area Exam  Skin & Axial Inspection: Well healed scar from previous spine surgery detected Alignment: Symmetrical Functional ROM: Decreased ROM      Stability: No instability detected Muscle Tone/Strength: Functionally intact. No obvious neuro-muscular anomalies detected. Sensory (Neurological): Dermatomal pain pattern Palpation: Complains of area being tender to palpation              Upper Extremity (UE) Exam    Side: Right upper extremity  Side: Left upper extremity  Skin & Extremity Inspection: Skin color, temperature, and hair growth are WNL. No peripheral edema or cyanosis. No masses, redness, swelling, asymmetry, or associated skin lesions. No contractures.  Skin & Extremity Inspection: Skin color, temperature, and hair growth are WNL. No peripheral edema or  cyanosis. No masses, redness, swelling, asymmetry, or associated skin lesions. No contractures.  Functional ROM: Decreased ROM          Functional ROM: Decreased ROM          Muscle Tone/Strength: Functionally intact. No obvious neuro-muscular anomalies detected.  Muscle Tone/Strength: Functionally intact. No obvious neuro-muscular anomalies detected.  Sensory (Neurological): Unimpaired          Sensory (Neurological): Unimpaired          Palpation: No palpable anomalies              Palpation: No palpable anomalies              Provocative Test(s):  Phalen's test: deferred Tinel's test: deferred Apley's scratch test (touch opposite shoulder):  Action 1 (Across chest): Decreased ROM Action 2 (Overhead): deferred Action 3 (LB reach): deferred   Provocative Test(s):  Phalen's test: deferred Tinel's test: deferred Apley's scratch test (touch opposite  shoulder):  Action 1 (Across chest): Decreased ROM Action 2 (Overhead): deferred Action 3 (LB reach): deferred    Thoracic Spine Area Exam  Skin & Axial Inspection: No masses, redness, or swelling Alignment: Symmetrical Functional ROM: Unrestricted ROM Stability: No instability detected Muscle Tone/Strength: Functionally intact. No obvious neuro-muscular anomalies detected. Sensory (Neurological): Unimpaired Muscle strength & Tone: No palpable anomalies  Lumbar Spine Area Exam  Skin & Axial Inspection: Well healed scar from previous spine surgery detected Alignment: Asymmetric Functional ROM: Decreased ROM affecting both sides Stability: No instability detected Muscle Tone/Strength: Functionally intact. No obvious neuro-muscular anomalies detected. Sensory (Neurological): Dermatomal pain pattern and musculoskeletal Palpation: Complains of area being tender to palpation       Provocative Tests: Hyperextension/rotation test: (+) bilaterally for facet joint pain. Lumbar quadrant test (Kemp's test): (+) bilaterally for facet joint  pain. Lateral bending test: (+) ipsilateral radicular pain, bilaterally. Positive for bilateral foraminal stenosis. Patrick's Maneuver: deferred today                   FABER test: deferred today                   S-I anterior distraction/compression test: deferred today         S-I lateral compression test: deferred today         S-I Thigh-thrust test: deferred today         S-I Gaenslen's test: deferred today          Gait & Posture Assessment  Ambulation: Patient ambulates using a [place] Gait: Limited. Using assistive device to ambulate Posture: Difficulty standing up straight, due to pain   Lower Extremity Exam    Side: Right lower extremity  Side: Left lower extremity  Stability: No instability observed          Stability: No instability observed          Skin & Extremity Inspection: Skin color, temperature, and hair growth are WNL. No peripheral edema or cyanosis. No masses, redness, swelling, asymmetry, or associated skin lesions. No contractures.  Skin & Extremity Inspection: Skin color, temperature, and hair growth are WNL. No peripheral edema or cyanosis. No masses, redness, swelling, asymmetry, or associated skin lesions. No contractures.  Functional ROM: Decreased ROM for hip and knee joints          Functional ROM: Decreased ROM for hip and knee joints          Muscle Tone/Strength: Functionally intact. No obvious neuro-muscular anomalies detected.  Muscle Tone/Strength: Functionally intact. No obvious neuro-muscular anomalies detected.  Sensory (Neurological): Dermatomal pain pattern  Sensory (Neurological): Dermatomal pain pattern  Palpation: No palpable anomalies  Palpation: No palpable anomalies   Assessment  Primary Diagnosis & Pertinent Problem List: The primary encounter diagnosis was History of lumbar laminectomy for spinal cord decompression (L2/3). Diagnoses of Spinal stenosis, lumbar region, with neurogenic claudication, Lumbar foraminal stenosis, Lumbar  radiculopathy, Lumbar degenerative disc disease, Cervical spinal stenosis, Morbid obesity (HCC), Cervical pain, Cervical radiculitis, Coronary artery disease involving native coronary artery of native heart without angina pectoris, Chronic diastolic congestive heart failure ([place]), Type 2 diabetes mellitus with chronic kidney disease, without long-term current use of insulin, unspecified CKD stage ([place]), and DDD (degenerative disc disease), cervical were also pertinent to this visit.  Visit Diagnosis (New problems to examiner): 1. History of lumbar laminectomy for spinal cord decompression (L2/3)   2. Spinal stenosis, lumbar region, with neurogenic claudication   3. Lumbar foraminal stenosis  4. Lumbar radiculopathy   5. Lumbar degenerative disc disease   6. Cervical spinal stenosis   7. Morbid obesity ([place])   8. Cervical pain   9. Cervical radiculitis   10. Coronary artery disease involving native coronary artery of native heart without angina pectoris   11. Chronic diastolic congestive heart failure ([place])   12. Type 2 diabetes mellitus with chronic kidney disease, without long-term current use of insulin, unspecified CKD stage ([place])   13. DDD (degenerative disc disease), cervical   General Recommendations: The pain condition that the patient suffers from is best treated with a multidisciplinary approach that involves an increase in physical activity to prevent de-conditioning and worsening of the pain cycle, as well as psychological counseling (formal and/or informal) to address the co-morbid psychological affects of pain. Treatment will often involve judicious use of pain medications and interventional procedures to decrease the pain, allowing the patient to participate in the physical activity that will ultimately produce long-lasting pain reductions. The goal of the multidisciplinary approach is to return the patient to a higher level of overall function and to restore their ability to perform  activities of daily living.   83 year old male with the above-mentioned problems who is referred here for interventional pain management, consideration of spinal cord stimulator therapy.  Had an extensive discussion with the patient regarding his symptoms, his MRI findings and his suitability for lumbar spinal cord stimulation.  Patient's lumbar MRI shows multilevel prior laminectomies ranging from L1-L2 to L4-L5.  Overlying these areas he also has facet hypertrophy along with bilateral recess stenosis along with neuroforaminal narrowing.  Patient also has canal stenosis at multiple lumbar levels.  The stenotic areas in his lower thoracic and upper lumbar spine may preclude safe placement of a percutaneous lead furthermore the patient has had altered anatomy in these regions from previous multiple laminectomies which further increases my concern of spinal tap and or spinal cord injury during  spinal cord stimulator trial.  Furthermore the patient has increased risk factors such as morbid obesity, type 2 diabetes, and a living situation without much support.  He states that his roommate is on oxygen.   Regards to therapeutic options, we discussed repeating epidural steroid injection however employing the interlaminar approach rather than the transforaminal approach since the patient has multilevel foraminal stenosis resulting in lumbar radiculopathy.  Goal will be L5-S1 ESI or caudal epidural steroid injection depending upon interlaminar window we are able to obtain at L5-S1.  Patient is only on aspirin which have been encouraged him to continue.  All questions and concerns were answered.   Plan of Care (Initial workup plan)  Ordered Lab-work, Procedure(s), Referral(s), & Consult(s): Orders Placed This Encounter  Procedures  . Lumbar Epidural Injection   Provider-requested follow-up: Return for Procedure.  Future Appointments  Date Time Provider [place]  05/05/2018  8:45 AM Gillis Santa,  MD [facility] None  05/16/2018  8:00 AM Birdie Sons, MD [facility] None  07/10/2018  9:00 AM Ernestine Conrad, Marlowe Kays None    Primary Care Physician: Birdie Sons, MD Location: [facility] Note by: Gillis Santa, M.D, Date: 04/10/2018; Time: 9:08 AM  Patient Instructions   Moderate Conscious Sedation, Adult Sedation is the use of medicines to promote relaxation and relieve discomfort and anxiety. Moderate conscious sedation is a type of sedation. Under moderate conscious sedation, you are less alert than normal, but you are still able to respond to instructions, touch, or both. Moderate conscious sedation  is used during short medical and dental procedures. It is milder than deep sedation, which is a type of sedation under which you cannot be easily woken up. It is also milder than general anesthesia, which is the use of medicines to make you unconscious. Moderate conscious sedation allows you to return to your regular activities sooner. Tell a health care provider about:  Any allergies you have.  All medicines you are taking, including vitamins, herbs, eye drops, creams, and over-the-counter medicines.  Use of steroids (by mouth or creams).  Any problems you or family members have had with sedatives and anesthetic medicines.  Any blood disorders you have.  Any surgeries you have had.  Any medical conditions you have, such as sleep apnea.  Whether you are pregnant or may be pregnant.  Any use of cigarettes, alcohol, marijuana, or street drugs. What are the risks? Generally, this is a safe procedure. However, problems may occur, including:  Getting too much medicine (oversedation).  Nausea.  Allergic reaction to medicines.  Trouble breathing. If this happens, a breathing tube may be used to help with breathing. It will be removed when you are awake and breathing on your own.  Heart trouble.  Lung trouble.  What happens before the  procedure? Staying hydrated Follow instructions from your health care provider about hydration, which may include:  Up to 2 hours before the procedure - you may continue to drink clear liquids, such as water, clear fruit juice, black coffee, and plain tea.  Eating and drinking restrictions Follow instructions from your health care provider about eating and drinking, which may include:  8 hours before the procedure - stop eating heavy meals or foods such as meat, fried foods, or fatty foods.  6 hours before the procedure - stop eating light meals or foods, such as toast or cereal.  6 hours before the procedure - stop drinking milk or drinks that contain milk.  2 hours before the procedure - stop drinking clear liquids.  Medicine  Ask your health care provider about:  Changing or stopping your regular medicines. This is especially important if you are taking diabetes medicines or blood thinners.  Taking medicines such as aspirin and ibuprofen. These medicines can thin your blood. Do not take these medicines before your procedure if your health care provider instructs you not to.  Tests and exams  You will have a physical exam.  You may have blood tests done to show: ? How well your kidneys and liver are working. ? How well your blood can clot. General instructions  Plan to have someone take you home from the hospital or clinic.  If you will be going home right after the procedure, plan to have someone with you for 24 hours. What happens during the procedure?  An IV tube will be inserted into one of your veins.  Medicine to help you relax (sedative) will be given through the IV tube.  The medical or dental procedure will be performed. What happens after the procedure?  Your blood pressure, heart rate, breathing rate, and blood oxygen level will be monitored often until the medicines you were given have worn off.  Do not drive for 24 hours. This information is not  intended to replace advice given to you by your health care provider. Make sure you discuss any questions you have with your health care provider. Document Released: 03/20/2001 Document Revised: 11/29/2015 Document Reviewed: 10/15/2015 Elsevier Interactive Patient Education  2018 [place] RISKS AND COMPLICATIONS  What are the risk, side effects and possible complications? Generally speaking, most procedures are safe.  However, with any procedure there are risks, side effects, and the possibility of complications.  The risks and complications are dependent upon the sites that are lesioned, or the type of nerve block to be performed.  The closer the procedure is to the spine, the more serious the risks are.  Great care is taken when placing the radio frequency needles, block needles or lesioning probes, but sometimes complications can occur. 1. Infection: Any time there is an injection through the skin, there is a risk of infection.  This is why sterile conditions are used for these blocks.  There are four possible types of infection. 1. Localized skin infection. 2. Central Nervous System Infection-This can be in the form of Meningitis, which can be deadly. 3. Epidural Infections-This can be in the form of an epidural abscess, which can cause pressure inside of the spine, causing compression of the spinal cord with subsequent paralysis. This would require an emergency surgery to decompress, and there are no guarantees that the patient would recover from the paralysis. 4. Discitis-This is an infection of the intervertebral discs.  It occurs in about 1% of discography procedures.  It is difficult to treat and it may lead to surgery.        2. Pain: the needles have to go through skin and soft tissues, will cause soreness.       3. Damage to internal structures:  The nerves to be lesioned may be near blood vessels or    other nerves which can be potentially damaged.       4. Bleeding:  Bleeding is more common if the patient is taking blood thinners such as  aspirin, Coumadin, Ticiid, Plavix, etc., or if he/she have some genetic predisposition  such as hemophilia. Bleeding into the spinal canal can cause compression of the spinal  cord with subsequent paralysis.  This would require an emergency surgery to  decompress and there are no guarantees that the patient would recover from the  paralysis.       5. Pneumothorax:  Puncturing of a lung is a possibility, every time a needle is introduced in  the area of the chest or upper back.  Pneumothorax refers to free air around the  collapsed lung(s), inside of the thoracic cavity (chest cavity).  Another two possible  complications related to a similar event would include: Hemothorax and Chylothorax.   These are variations of the Pneumothorax, where instead of air around the collapsed  lung(s), you may have blood or chyle, respectively.       6. Spinal headaches: They may occur with any procedures in the area of the spine.       7. Persistent CSF (Cerebro-Spinal Fluid) leakage: This is a rare problem, but may occur  with prolonged intrathecal or epidural catheters either due to the formation of a fistulous  track or a dural tear.       8. Nerve damage: By working so close to the spinal cord, there is always a possibility of  nerve damage, which could be as serious as a permanent spinal cord injury with  paralysis.       9. Death:  Although rare, severe deadly allergic reactions known as "Anaphylactic  reaction" can occur to any of the medications used.      10. Worsening of the symptoms:  We can always make thing worse.  What are the chances  of something like this happening? Chances of any of this occuring are extremely low.  By statistics, you have more of a chance of getting killed in a motor vehicle accident: while driving to the hospital than any of the above occurring .  Nevertheless, you should be aware that they are possibilities.  In  general, it is similar to taking a shower.  Everybody knows that you can slip, hit your head and get killed.  Does that mean that you should not shower again?  Nevertheless always keep in mind that statistics do not mean anything if you happen to be on the wrong side of them.  Even if a procedure has a 1 (one) in a 1,000,000 (million) chance of going wrong, it you happen to be that one..Also, keep in mind that by statistics, you have more of a chance of having something go wrong when taking medications.  Who should not have this procedure? If you are on a blood thinning medication (e.g. Coumadin, Plavix, see list of "Blood Thinners"), or if you have an active infection going on, you should not have the procedure.  If you are taking any blood thinners, please inform your physician.  How should I prepare for this procedure?  Do not eat or drink anything at least six hours prior to the procedure.  Bring a driver with you .  It cannot be a taxi.  Come accompanied by an adult that can drive you back, and that is strong enough to help you if your legs get weak or numb from the local anesthetic.  Take all of your medicines the morning of the procedure with just enough water to swallow them.  If you have diabetes, make sure that you are scheduled to have your procedure done first thing in the morning, whenever possible.  If you have diabetes, take only half of your insulin dose and notify our nurse that you have done so as soon as you arrive at the clinic.  If you are diabetic, but only take blood sugar pills (oral hypoglycemic), then do not take them on the morning of your procedure.  You may take them after you have had the procedure.  Do not take aspirin or any aspirin-containing medications, at least eleven (11) days prior to the procedure.  They may prolong bleeding.  Wear loose fitting clothing that may be easy to take off and that you would not mind if it got stained with Betadine or  blood.  Do not wear any jewelry or perfume  Remove any nail coloring.  It will interfere with some of our monitoring equipment.  NOTE: Remember that this is not meant to be interpreted as a complete list of all possible complications.  Unforeseen problems may occur.  BLOOD THINNERS The following drugs contain aspirin or other products, which can cause increased bleeding during surgery and should not be taken for 2 weeks prior to and 1 week after surgery.  If you should need take something for relief of minor pain, you may take acetaminophen which is found in Tylenol,m Datril, Anacin-3 and Panadol. It is not blood thinner. The products listed below are.  Do not take any of the products listed below in addition to any listed on your instruction sheet.  A.P.C or A.P.C with Codeine Codeine Phosphate Capsules #3 Ibuprofen Ridaura  ABC compound Congesprin Imuran rimadil  Advil Cope Indocin Robaxisal  Alka-Seltzer Effervescent Pain Reliever and Antacid Coricidin or Coricidin-D  Indomethacin Rufen  Alka-Seltzer plus Cold Medicine  Cosprin Ketoprofen S-A-C Tablets  Anacin Analgesic Tablets or Capsules Coumadin Korlgesic Salflex  Anacin Extra Strength Analgesic tablets or capsules CP-2 Tablets Lanoril Salicylate  Anaprox Cuprimine Capsules Levenox Salocol  Anexsia-D Dalteparin Magan Salsalate  Anodynos Darvon compound Magnesium Salicylate Sine-off  Ansaid Dasin Capsules Magsal Sodium Salicylate  Anturane Depen Capsules Marnal Soma  APF Arthritis pain formula Dewitt's Pills Measurin Stanback  Argesic Dia-Gesic Meclofenamic Sulfinpyrazone  Arthritis Bayer Timed Release Aspirin Diclofenac Meclomen Sulindac  Arthritis pain formula Anacin Dicumarol Medipren Supac  Analgesic (Safety coated) Arthralgen Diffunasal Mefanamic Suprofen  Arthritis Strength Bufferin Dihydrocodeine Mepro Compound Suprol  Arthropan liquid Dopirydamole Methcarbomol with Aspirin Synalgos  ASA tablets/Enseals Disalcid Micrainin  Tagament  Ascriptin Doan's Midol Talwin  Ascriptin A/D Dolene Mobidin Tanderil  Ascriptin Extra Strength Dolobid Moblgesic Ticlid  Ascriptin with Codeine Doloprin or Doloprin with Codeine Momentum Tolectin  Asperbuf Duoprin Mono-gesic Trendar  Aspergum Duradyne Motrin or Motrin IB Triminicin  Aspirin plain, buffered or enteric coated Durasal Myochrisine Trigesic  Aspirin Suppositories Easprin Nalfon Trillsate  Aspirin with Codeine Ecotrin Regular or Extra Strength Naprosyn Uracel  Atromid-S Efficin Naproxen Ursinus  Auranofin Capsules Elmiron Neocylate Vanquish  Axotal Emagrin Norgesic Verin  Azathioprine Empirin or Empirin with Codeine Normiflo Vitamin E  Azolid Emprazil Nuprin Voltaren  Bayer Aspirin plain, buffered or children's or timed BC Tablets or powders Encaprin Orgaran Warfarin Sodium  Buff-a-Comp Enoxaparin Orudis Zorpin  Buff-a-Comp with Codeine Equegesic Os-Cal-Gesic   Buffaprin Excedrin plain, buffered or Extra Strength Oxalid   Bufferin Arthritis Strength Feldene Oxphenbutazone   Bufferin plain or Extra Strength Feldene Capsules Oxycodone with Aspirin   Bufferin with Codeine Fenoprofen Fenoprofen Pabalate or Pabalate-SF   Buffets II Flogesic Panagesic   Buffinol plain or Extra Strength Florinal or Florinal with Codeine Panwarfarin   Buf-Tabs Flurbiprofen Penicillamine   Butalbital Compound Four-way cold tablets Penicillin   Butazolidin Fragmin Pepto-Bismol   Carbenicillin Geminisyn Percodan   Carna Arthritis Reliever Geopen Persantine   Carprofen Gold's salt Persistin   Chloramphenicol Goody's Phenylbutazone   Chloromycetin Haltrain Piroxlcam   Clmetidine heparin Plaquenil   Cllnoril Hyco-pap Ponstel   Clofibrate Hydroxy chloroquine Propoxyphen         Before stopping any of these medications, be sure to consult the physician who ordered them.  Some, such as Coumadin (Warfarin) are ordered to prevent or treat serious conditions such as "deep thrombosis", "pumonary  embolisms", and other heart problems.  The amount of time that you may need off of the medication may also vary with the medication and the reason for which you were taking it.  If you are taking any of these medications, please make sure you notify your pain physician before you undergo any procedures.         Epidural Steroid Injection Patient Information  Description: The epidural space surrounds the nerves as they exit the spinal cord.  In some patients, the nerves can be compressed and inflamed by a bulging disc or a tight spinal canal (spinal stenosis).  By injecting steroids into the epidural space, we can bring irritated nerves into direct contact with a potentially helpful medication.  These steroids act directly on the irritated nerves and can reduce swelling and inflammation which often leads to decreased pain.  Epidural steroids may be injected anywhere along the spine and from the neck to the low back depending upon the location of your pain.   After numbing the skin with local anesthetic (like Novocaine), a small needle is passed into the epidural space  slowly.  You may experience a sensation of pressure while this is being done.  The entire block usually last less than 10 minutes.  Conditions which may be treated by epidural steroids:   Low back and leg pain  Neck and arm pain  Spinal stenosis  Post-laminectomy syndrome  Herpes zoster (shingles) pain  Pain from compression fractures  Preparation for the injection:  1. Do not eat any solid food or dairy products within 8 hours of your appointment.  2. You may drink clear liquids up to 3 hours before appointment.  Clear liquids include water, black coffee, juice or soda.  No milk or cream please. 3. You may take your regular medication, including pain medications, with a sip of water before your appointment  Diabetics should hold regular insulin (if taken separately) and take 1/2 normal NPH dos the morning of the  procedure.  Carry some sugar containing items with you to your appointment. 4. A driver must accompany you and be prepared to drive you home after your procedure.  5. Bring all your current medications with your. 6. An IV may be inserted and sedation may be given at the discretion of the physician.   7. A blood pressure cuff, EKG and other monitors will often be applied during the procedure.  Some patients may need to have extra oxygen administered for a short period. 8. You will be asked to provide medical information, including your allergies, prior to the procedure.  We must know immediately if you are taking blood thinners (like Coumadin/Warfarin)  Or if you are allergic to IV iodine contrast (dye). We must know if you could possible be pregnant.  Possible side-effects:  Bleeding from needle site  Infection (rare, may require surgery)  Nerve injury (rare)  Numbness & tingling (temporary)  Difficulty urinating (rare, temporary)  Spinal headache ( a headache worse with upright posture)  Light -headedness (temporary)  Pain at injection site (several days)  Decreased blood pressure (temporary)  Weakness in arm/leg (temporary)  Pressure sensation in back/neck (temporary)  Call if you experience:  Fever/chills associated with headache or increased back/neck pain.  Headache worsened by an upright position.  New onset weakness or numbness of an extremity below the injection site  Hives or difficulty breathing (go to the emergency room)  Inflammation or drainage at the infection site  Severe back/neck pain  Any new symptoms which are concerning to you  Please note:  Although the local anesthetic injected can often make your back or neck feel good for several hours after the injection, the pain will likely return.  It takes 3-7 days for steroids to work in the epidural space.  You may not notice any pain relief for at least that one week.  If effective, we will often do a  series of three injections spaced 3-6 weeks apart to maximally decrease your pain.  After the initial series, we generally will wait several months before considering a repeat injection of the same type.  If you have any questions, please call [phone] [facility]

## 2018-04-17 DIAGNOSIS — G4733 Obstructive sleep apnea (adult) (pediatric): Secondary | ICD-10-CM | POA: Diagnosis not present

## 2018-04-18 ENCOUNTER — Telehealth: Payer: Self-pay | Admitting: Family Medicine

## 2018-04-18 NOTE — Telephone Encounter (Signed)
Pt received a letter from [employer].  They are stating they have discontinued the meter Accu-Check and all supplies. Pt will need a new meter and supplies called in for pt to test his BS.  Please advise.  Thanks, [employer]

## 2018-04-18 NOTE — Telephone Encounter (Signed)
Spoke with Mr. Stick he is not sure what meter his insurance will cover.  He will call the insurance company on Monday to find out.   Thanks,   -Mickel Baas

## 2018-04-18 NOTE — Telephone Encounter (Signed)
Tried calling patient X2 and line was busy. Will try again later.

## 2018-04-18 NOTE — Telephone Encounter (Signed)
Does the letter say what meter is covered by his insurance?

## 2018-04-21 NOTE — Telephone Encounter (Signed)
Pt called back with the meter name is Accu-heck Guide system.  He will need the whole kit.  It replaces his current meter.  Thanks  [employer]

## 2018-04-22 MED ORDER — ACCU-CHEK GUIDE W/DEVICE KIT: 1.0000 | PACK | Freq: Every day | 0 refills | Status: DC

## 2018-04-22 MED ORDER — GLUCOSE BLOOD VI STRP: ORAL_STRIP | 4 refills | Status: DC

## 2018-05-05 ENCOUNTER — Encounter: Payer: Self-pay | Admitting: Student in an Organized Health Care Education/Training Program

## 2018-05-05 ENCOUNTER — Other Ambulatory Visit: Payer: Self-pay

## 2018-05-05 ENCOUNTER — Ambulatory Visit
Admission: RE | Admit: 2018-05-05 | Discharge: 2018-05-05 | Disposition: A | Discharge status: Home / Self Care | Payer: Medicare Other | Source: Ambulatory Visit | Attending: Student in an Organized Health Care Education/Training Program | Admitting: Student in an Organized Health Care Education/Training Program

## 2018-05-05 ENCOUNTER — Ambulatory Visit (HOSPITAL_BASED_OUTPATIENT_CLINIC_OR_DEPARTMENT_OTHER): Payer: Medicare Other | Admitting: Student in an Organized Health Care Education/Training Program

## 2018-05-05 VITALS — BP 118/72 | HR 76 | Temp 97.8°F | Resp 16 | Ht 61.0 in | Wt 227.0 lb

## 2018-05-05 DIAGNOSIS — Z9889 Other specified postprocedural states: Secondary | ICD-10-CM

## 2018-05-05 DIAGNOSIS — M5416 Radiculopathy, lumbar region: Secondary | ICD-10-CM

## 2018-05-05 DIAGNOSIS — M48061 Spinal stenosis, lumbar region without neurogenic claudication: Secondary | ICD-10-CM

## 2018-05-05 DIAGNOSIS — M48062 Spinal stenosis, lumbar region with neurogenic claudication: Secondary | ICD-10-CM

## 2018-05-05 DIAGNOSIS — Z9049 Acquired absence of other specified parts of digestive tract: Secondary | ICD-10-CM | POA: Insufficient documentation

## 2018-05-05 DIAGNOSIS — Z96653 Presence of artificial knee joint, bilateral: Secondary | ICD-10-CM | POA: Diagnosis not present

## 2018-05-05 DIAGNOSIS — G894 Chronic pain syndrome: Secondary | ICD-10-CM | POA: Diagnosis not present

## 2018-05-05 DIAGNOSIS — Z951 Presence of aortocoronary bypass graft: Secondary | ICD-10-CM | POA: Insufficient documentation

## 2018-05-05 MED ORDER — IOPAMIDOL (ISOVUE-M 200) INJECTION 41%
10.0000 mL | Freq: Once | INTRAMUSCULAR | Status: AC
  Administered 2018-05-05: 10 mL via EPIDURAL
  Filled 2018-05-05: qty 10

## 2018-05-05 MED ORDER — SODIUM CHLORIDE 0.9% FLUSH
2.0000 mL | Freq: Once | INTRAVENOUS | Status: AC
  Administered 2018-05-05: 10 mL

## 2018-05-05 MED ORDER — LIDOCAINE HCL 2 % IJ SOLN
10.0000 mL | Freq: Once | INTRAMUSCULAR | Status: AC
  Administered 2018-05-05: 400 mg

## 2018-05-05 MED ORDER — DEXAMETHASONE SODIUM PHOSPHATE 10 MG/ML IJ SOLN
10.0000 mg | Freq: Once | INTRAMUSCULAR | Status: AC
  Administered 2018-05-05: 10 mg

## 2018-05-05 MED ORDER — ROPIVACAINE HCL 2 MG/ML IJ SOLN
INTRAMUSCULAR | Status: AC
  Filled 2018-05-05: qty 10

## 2018-05-05 MED ORDER — HYDROCODONE-ACETAMINOPHEN 5-325 MG PO TABS: 1.0000 | ORAL_TABLET | Freq: Two times a day (BID) | ORAL | 0 refills | Status: DC

## 2018-05-05 MED ORDER — ROPIVACAINE HCL 2 MG/ML IJ SOLN: 2.0000 mL | Freq: Once | INTRAMUSCULAR | Status: DC

## 2018-05-05 MED ORDER — LIDOCAINE HCL 2 % IJ SOLN
INTRAMUSCULAR | Status: AC
  Filled 2018-05-05: qty 20

## 2018-05-05 MED ORDER — DEXAMETHASONE SODIUM PHOSPHATE 10 MG/ML IJ SOLN
INTRAMUSCULAR | Status: AC
  Filled 2018-05-05: qty 1

## 2018-05-05 NOTE — Progress Notes (Signed)
Patient's Name: Mark Terry.  MRN: [id number]  Referring Provider: Birdie Sons, MD  DOB: February 11, 1935  PCP: Birdie Sons, MD  DOS: 05/05/2018  Note by: Gillis Santa, MD  Service setting: Ambulatory outpatient  Specialty: Interventional Pain Management  Patient type: Established  Location: [facility] (AMB) Pain Management Facility  Visit type: Interventional Procedure   Primary Reason for Visit: Interventional Pain Management Treatment. CC: low back pain with radiation to L>R leg  Procedure:          Anesthesia, Analgesia, Anxiolysis:  Type: Diagnostic Inter-Laminar Epidural Steroid Injection  #1  Region: Lumbar Level: L5-S1 Level. Laterality: Left-Sided         Type: Local Anesthesia Indication(s): Analgesia         Route: Infiltration ([place]/IM) IV Access: Declined Sedation: Declined  Local Anesthetic: Lidocaine 2%  Position: Prone with head of the table was raised to facilitate breathing.   Indications: 1. Lumbar radiculopathy   2. History of lumbar laminectomy for spinal cord decompression (L2/3)   3. Spinal stenosis, lumbar region, with neurogenic claudication   4. Chronic pain syndrome   5. Lumbar foraminal stenosis    Pain Score: Pre-procedure: 7 /10 Post-procedure: 0-No pain/10  Pre-op Assessment:  Mr. Nelles is a 83 y.o. (year old), male patient, seen today for interventional treatment. He  has a past surgical history that includes Cholecystectomy (1970); Replacement total knee bilateral ('86 and 2000); Total hip arthroplasty (Right, 2012); Neck surgery; Coronary artery bypass graft (2000); Colonoscopy; bilateral cataract surgery; Reverse shoulder arthroplasty (05/30/2012); Back surgery; Ankle Fusion (Right, 11/04/2015); prostate laser surgery; Coronary angioplasty; Joint replacement; and Esophagogastroduodenoscopy (egd) with propofol (N/A, 11/18/2017). Mr. Vittorio has a current medication list which includes the following prescription(s): aspirin, accu-chek guide,  doxazosin, fluticasone, furosemide, gabapentin, glimepiride, glucose blood, hydrocodone-acetaminophen, ibuprofen, jardiance, loratadine, meloxicam, metformin, metoprolol tartrate, mirabegron er, mirtazapine, multivitamin with minerals, omeprazole, oxybutynin, potassium chloride sa, simvastatin, and trazodone, and the following Facility-Administered Medications: ropivacaine (pf) 2 mg/ml (0.2%). His primarily concern today is the Back Pain (mid and low) and Leg Pain (R>L laterally to foot)  Initial Vital Signs:  Pulse/HCG Rate: 70  Temp: 97.8 F (36.6 C) Resp: 18 BP: 126/69 SpO2: 100 %  BMI: Estimated body mass index is 42.89 kg/m as calculated from the following:   Height as of this encounter: 5\' 1"  (1.549 m).   Weight as of this encounter: 227 lb (103 kg).  Risk Assessment: Allergies: Reviewed. He is allergic to propoxyphene; codeine sulfate; darvon [propoxyphene hcl]; demerol [meperidine]; and oxycodone.  Allergy Precautions: None required Coagulopathies: Reviewed. None identified.  Blood-thinner therapy: None at this time Active Infection(s): Reviewed. None identified. Mr. Langham is afebrile  Site Confirmation: Mr. Barrell was asked to confirm the procedure and laterality before marking the site Procedure checklist: Completed Consent: Before the procedure and under the influence of no sedative(s), amnesic(s), or anxiolytics, the patient was informed of the treatment options, risks and possible complications. To fulfill our ethical and legal obligations, as recommended by the American Medical Association's Code of Ethics, I have informed the patient of my clinical impression; the nature and purpose of the treatment or procedure; the risks, benefits, and possible complications of the intervention; the alternatives, including doing nothing; the risk(s) and benefit(s) of the alternative treatment(s) or procedure(s); and the risk(s) and benefit(s) of doing nothing. The patient was provided  information about the general risks and possible complications associated with the procedure. These may include, but are not limited to: failure to achieve  desired goals, infection, bleeding, organ or nerve damage, allergic reactions, paralysis, and death. In addition, the patient was informed of those risks and complications associated to Spine-related procedures, such as failure to decrease pain; infection (i.e.: Meningitis, epidural or intraspinal abscess); bleeding (i.e.: epidural hematoma, subarachnoid hemorrhage, or any other type of intraspinal or peri-dural bleeding); organ or nerve damage (i.e.: Any type of peripheral nerve, nerve root, or spinal cord injury) with subsequent damage to sensory, motor, and/or autonomic systems, resulting in permanent pain, numbness, and/or weakness of one or several areas of the body; allergic reactions; (i.e.: anaphylactic reaction); and/or death. Furthermore, the patient was informed of those risks and complications associated with the medications. These include, but are not limited to: allergic reactions (i.e.: anaphylactic or anaphylactoid reaction(s)); adrenal axis suppression; blood sugar elevation that in diabetics may result in ketoacidosis or comma; water retention that in patients with history of congestive heart failure may result in shortness of breath, pulmonary edema, and decompensation with resultant heart failure; weight gain; swelling or edema; medication-induced neural toxicity; particulate matter embolism and blood vessel occlusion with resultant organ, and/or nervous system infarction; and/or aseptic necrosis of one or more joints. Finally, the patient was informed that Medicine is not an exact science; therefore, there is also the possibility of unforeseen or unpredictable risks and/or possible complications that may result in a catastrophic outcome. The patient indicated having understood very clearly. We have given the patient no guarantees and we  have made no promises. Enough time was given to the patient to ask questions, all of which were answered to the patient's satisfaction. Mr. Fertig has indicated that he wanted to continue with the procedure. Attestation: I, the ordering provider, attest that I have discussed with the patient the benefits, risks, side-effects, alternatives, likelihood of achieving goals, and potential problems during recovery for the procedure that I have provided informed consent. Date  Time: 05/05/2018  8:45 AM  Pre-Procedure Preparation:  Monitoring: As per clinic protocol. Respiration, ETCO2, SpO2, BP, heart rate and rhythm monitor placed and checked for adequate function Safety Precautions: Patient was assessed for positional comfort and pressure points before starting the procedure. Time-out: I initiated and conducted the "Time-out" before starting the procedure, as per protocol. The patient was asked to participate by confirming the accuracy of the "Time Out" information. Verification of the correct person, site, and procedure were performed and confirmed by me, the nursing staff, and the patient. "Time-out" conducted as per Joint Commission's Universal Protocol (UP.01.01.01). Time: 1023  Description of Procedure:          Target Area: The interlaminar space, initially targeting the lower laminar border of the superior vertebral body. Approach: Paramedial approach. Area Prepped: Entire Posterior Lumbar Region Prepping solution: ChloraPrep (2% chlorhexidine gluconate and 70% isopropyl alcohol) Safety Precautions: Aspiration looking for blood return was conducted prior to all injections. At no point did we inject any substances, as a needle was being advanced. No attempts were made at seeking any paresthesias. Safe injection practices and needle disposal techniques used. Medications properly checked for expiration dates. SDV (single dose vial) medications used. Description of the Procedure: Protocol guidelines  were followed. The procedure needle was introduced through the skin, ipsilateral to the reported pain, and advanced to the target area. Bone was contacted and the needle walked caudad, until the lamina was cleared. The epidural space was identified using "loss-of-resistance technique" with 2-3 ml of PF-NaCl (0.9% NSS), in a 5cc LOR glass syringe.  Vitals:   05/05/18 1015 05/05/18  1020 05/05/18 1025 05/05/18 1031  BP: 120/90 123/79 110/70 118/72  Pulse: 79 81 75 76  Resp: 16 14 18 16   Temp:      TempSrc:      SpO2: 97% 98% 96% 97%  Weight:      Height:        Start Time: 1023 hrs. End Time: 1029 hrs.  Materials:  Needle(s) Type: Epidural needle Gauge: 17G Length: 3.5-in Medication(s): Please see orders for medications and dosing details. 7 cc solution made of 5 cc of preservative-free saline, 1 cc of 0.2% ropivacaine, 1 cc of Decadron 10 mg/cc Imaging Guidance (Spinal):          Type of Imaging Technique: Fluoroscopy Guidance (Spinal) Indication(s): Assistance in needle guidance and placement for procedures requiring needle placement in or near specific anatomical locations not easily accessible without such assistance. Exposure Time: Please see nurses notes. Contrast: Before injecting any contrast, we confirmed that the patient did not have an allergy to iodine, shellfish, or radiological contrast. Once satisfactory needle placement was completed at the desired level, radiological contrast was injected. Contrast injected under live fluoroscopy. No contrast complications. See chart for type and volume of contrast used. Fluoroscopic Guidance: I was personally present during the use of fluoroscopy. "Tunnel Vision Technique" used to obtain the best possible view of the target area. Parallax error corrected before commencing the procedure. "Direction-depth-direction" technique used to introduce the needle under continuous pulsed fluoroscopy. Once target was reached, antero-posterior, oblique,  and lateral fluoroscopic projection used confirm needle placement in all planes. Images permanently stored in EMR. Interpretation: I personally interpreted the imaging intraoperatively. Adequate needle placement confirmed in multiple planes. Appropriate spread of contrast into desired area was observed. No evidence of afferent or efferent intravascular uptake. No intrathecal or subarachnoid spread observed. Permanent images saved into the patient's record.  Antibiotic Prophylaxis:   Anti-infectives (From admission, onward)   None     Indication(s): None identified  Post-operative Assessment:  Post-procedure Vital Signs:  Pulse/HCG Rate: 76  Temp: 97.8 F (36.6 C) Resp: 16 BP: 118/72 SpO2: 97 %  EBL: None  Complications: No immediate post-treatment complications observed by team, or reported by patient.  Note: The patient tolerated the entire procedure well. A repeat set of vitals were taken after the procedure and the patient was kept under observation following institutional policy, for this type of procedure. Post-procedural neurological assessment was performed, showing return to baseline, prior to discharge. The patient was provided with post-procedure discharge instructions, including a section on how to identify potential problems. Should any problems arise concerning this procedure, the patient was given instructions to immediately contact us, at any time, without hesitation. In any case, we plan to contact the patient by telephone for a follow-up status report regarding this interventional procedure.  Comments:  No additional relevant information.  Plan of Care   Imaging Orders     DG C-Arm 1-60 Min-No Report Procedure Orders    No procedure(s) ordered today    Patient also states that he is out of his hydrocodone.  He takes this twice daily.  We will obtain a urine drug screen and also prescribe hydrocodone at his previous dose of 5 mill grams twice daily as  needed.  Medications ordered for procedure: Meds ordered this encounter  Medications  . iopamidol (ISOVUE-M) 41 % intrathecal injection 10 mL  . dexamethasone (DECADRON) injection 10 mg  . ropivacaine (PF) 2 mg/mL (0.2%) (NAROPIN) injection 2 mL  . sodium chloride flush (  NS) 0.9 % injection 2 mL  . lidocaine (XYLOCAINE) 2 % (with pres) injection 200 mg  . HYDROcodone-acetaminophen (NORCO/VICODIN) 5-325 MG tablet    Sig: Take 1 tablet by mouth 2 (two) times daily.    Dispense:  60 tablet    Refill:  0   Medications administered: We administered iopamidol, dexamethasone, sodium chloride flush, and lidocaine.  See the medical record for exact dosing, route, and time of administration.  Disposition: Discharge home  Discharge Date & Time: 05/05/2018; 1046 hrs.   Physician-requested Follow-up: Return in about 4 weeks (around 06/02/2018) for Post Procedure Evaluation, Medication Management.  Future Appointments  Date Time Provider [place]  05/16/2018  8:00 AM Birdie Sons, MD [facility] None  05/29/2018 10:15 AM Gillis Santa, MD [facility] None  07/10/2018  9:00 AM Ernestine Conrad, Marlowe Kays None   Primary Care Physician: Birdie Sons, MD Location: [facility] Facility Note by: Gillis Santa, MD Date: 05/05/2018; Time: 12:00 PM  Disclaimer:  Medicine is not an exact science. The only guarantee in medicine is that nothing is guaranteed. It is important to note that the decision to proceed with this intervention was based on the information collected from the patient. The Data and conclusions were drawn from the patient's questionnaire, the interview, and the physical examination. Because the information was provided in large part by the patient, it cannot be guaranteed that it has not been purposely or unconsciously manipulated. Every effort has been made to obtain as much relevant data as possible for this evaluation. It is important to note that the  conclusions that lead to this procedure are derived in large part from the available data. Always take into account that the treatment will also be dependent on availability of resources and existing treatment guidelines, considered by other Pain Management Practitioners as being common knowledge and practice, at the time of the intervention. For Medico-Legal purposes, it is also important to point out that variation in procedural techniques and pharmacological choices are the acceptable norm. The indications, contraindications, technique, and results of the above procedure should only be interpreted and judged by a Board-Certified Interventional Pain Specialist with extensive familiarity and expertise in the same exact procedure and technique.

## 2018-05-05 NOTE — Progress Notes (Signed)
Safety precautions to be maintained throughout the outpatient stay will include: orient to surroundings, keep bed in low position, maintain call bell within reach at all times, provide assistance with transfer out of bed and ambulation.  

## 2018-05-05 NOTE — Patient Instructions (Signed)
____________________________________________________________________________________________  Post-Procedure Discharge Instructions  Instructions:  Apply ice: Fill a plastic sandwich bag with crushed ice. Cover it with a small towel and apply to injection site. Apply for 15 minutes then remove x 15 minutes. Repeat sequence on day of procedure, until you go to bed. The purpose is to minimize swelling and discomfort after procedure.  Apply heat: Apply heat to procedure site starting the day following the procedure. The purpose is to treat any soreness and discomfort from the procedure.  Food intake: Start with clear liquids (like water) and advance to regular food, as tolerated.   Physical activities: Keep activities to a minimum for the first 8 hours after the procedure.   Driving: If you have received any sedation, you are not allowed to drive for 24 hours after your procedure.  Blood thinner: Restart your blood thinner 6 hours after your procedure. (Only for those taking blood thinners)  Insulin: As soon as you can eat, you may resume your normal dosing schedule. (Only for those taking insulin)  Infection prevention: Keep procedure site clean and dry.  Post-procedure Pain Diary: Extremely important that this be done correctly and accurately. Recorded information will be used to determine the next step in treatment.  Pain evaluated is that of treated area only. Do not include pain from an untreated area.  Complete every hour, on the hour, for the initial 8 hours. Set an alarm to help you do this part accurately.  Do not go to sleep and have it completed later. It will not be accurate.  Follow-up appointment: Keep your follow-up appointment after the procedure. Usually 2 weeks for most procedures. (6 weeks in the case of radiofrequency.) Bring you pain diary.   Expect:  From numbing medicine (AKA: Local Anesthetics): Numbness or decrease in pain.  Onset: Full effect within 15  minutes of injected.  Duration: It will depend on the type of local anesthetic used. On the average, 1 to 8 hours.   From steroids: Decrease in swelling or inflammation. Once inflammation is improved, relief of the pain will follow.  Onset of benefits: Depends on the amount of swelling present. The more swelling, the longer it will take for the benefits to be seen. In some cases, up to 10 days.  Duration: Steroids will stay in the system x 2 weeks. Duration of benefits will depend on multiple posibilities including persistent irritating factors.  Occasional side-effects: Facial flushing, cramps (if present, drink Gatorade and take over-the-counter Magnesium 450-500 mg once to twice a day).  From procedure: Some discomfort is to be expected once the numbing medicine wears off. This should be minimal if ice and heat are applied as instructed.  Call if:  You experience numbness and weakness that gets worse with time, as opposed to wearing off.  New onset bowel or bladder incontinence. (This applies to Spinal procedures only)  Emergency Numbers:  Durning business hours (Monday - Thursday, 8:00 AM - 4:00 PM) (Friday, 9:00 AM - 12:00 Noon): [phone]  After hours: [phone] ____________________________________________________________________________________________  Epidural Steroid Injection An epidural steroid injection is a shot of steroid medicine and numbing medicine that is given into the space between the spinal cord and the bones in your back (epidural space). The shot helps relieve pain caused by an irritated or swollen nerve root. The amount of pain relief you get from the injection depends on what is causing the nerve to be swollen and irritated, and how long your pain lasts. You are more likely  to benefit from this injection if your pain is strong and comes on suddenly rather than if you have had pain for a long time. Tell a health care provider about:  Any allergies  you have.  All medicines you are taking, including vitamins, herbs, eye drops, creams, and over-the-counter medicines.  Any problems you or family members have had with anesthetic medicines.  Any blood disorders you have.  Any surgeries you have had.  Any medical conditions you have.  Whether you are pregnant or may be pregnant. What are the risks? Generally, this is a safe procedure. However, problems may occur, including:  Headache.  Bleeding.  Infection.  Allergic reaction to medicines.  Damage to your nerves.  What happens before the procedure? Staying hydrated Follow instructions from your health care provider about hydration, which may include:  Up to 2 hours before the procedure - you may continue to drink clear liquids, such as water, clear fruit juice, black coffee, and plain tea.  Eating and drinking restrictions Follow instructions from your health care provider about eating and drinking, which may include:  8 hours before the procedure - stop eating heavy meals or foods such as meat, fried foods, or fatty foods.  6 hours before the procedure - stop eating light meals or foods, such as toast or cereal.  6 hours before the procedure - stop drinking milk or drinks that contain milk.  2 hours before the procedure - stop drinking clear liquids.  Medicine  You may be given medicines to lower anxiety.  Ask your health care provider about: ? Changing or stopping your regular medicines. This is especially important if you are taking diabetes medicines or blood thinners. ? Taking medicines such as aspirin and ibuprofen. These medicines can thin your blood. Do not take these medicines before your procedure if your health care provider instructs you not to. General instructions  Plan to have someone take you home from the hospital or clinic. What happens during the procedure?  You may receive a medicine to help you relax (sedative).  You will be asked to lie  on your abdomen.  The injection site will be cleaned.  A numbing medicine (local anesthetic) will be used to numb the injection site.  A needle will be inserted through your skin into the epidural space. You may feel some discomfort when this happens. An X-ray machine will be used to make sure the needle is put as close as possible to the affected nerve.  A steroid medicine and a local anesthetic will be injected into the epidural space.  The needle will be removed.  A bandage (dressing) will be put over the injection site. What happens after the procedure?  Your blood pressure, heart rate, breathing rate, and blood oxygen level will be monitored until the medicines you were given have worn off.  Your arm or leg may feel weak or numb for a few hours.  The injection site may feel sore.  Do not drive for 24 hours if you received a sedative. This information is not intended to replace advice given to you by your health care provider. Make sure you discuss any questions you have with your health care provider. Document Released: 10/02/2007 Document Revised: 12/07/2015 Document Reviewed: 10/11/2015 Elsevier Interactive Patient Education  [employer].

## 2018-05-06 ENCOUNTER — Telehealth: Payer: Self-pay

## 2018-05-06 NOTE — Telephone Encounter (Signed)
Post procedure phone call.  Patient states he is doing good.  

## 2018-05-09 LAB — COMPLIANCE DRUG ANALYSIS, UR

## 2018-05-16 ENCOUNTER — Ambulatory Visit (INDEPENDENT_AMBULATORY_CARE_PROVIDER_SITE_OTHER): Payer: Medicare Other | Admitting: Family Medicine

## 2018-05-16 ENCOUNTER — Encounter: Payer: Self-pay | Admitting: Family Medicine

## 2018-05-16 VITALS — BP 122/68 | HR 64 | Temp 97.7°F | Resp 16 | Wt 234.0 lb

## 2018-05-16 DIAGNOSIS — E7800 Pure hypercholesterolemia, unspecified: Secondary | ICD-10-CM | POA: Diagnosis not present

## 2018-05-16 DIAGNOSIS — E1122 Type 2 diabetes mellitus with diabetic chronic kidney disease: Secondary | ICD-10-CM

## 2018-05-16 DIAGNOSIS — I10 Essential (primary) hypertension: Secondary | ICD-10-CM | POA: Diagnosis not present

## 2018-05-16 NOTE — Progress Notes (Signed)
     Patient: Mark W Zinni Jr. Male    DOB: 04/17/1935   83 y.o.   MRN: [id number] Visit Date: 05/16/2018  Today's Provider: Donald Fisher, MD   Chief Complaint  Patient presents with  . Diabetes  . Hyperlipidemia  . Hypertension   Subjective:    HPI  Diabetes Mellitus Type II, Follow-up:   Lab Results  Component Value Date   HGBA1C 8.1 (A) 01/13/2018   HGBA1C 7.9 09/10/2017   HGBA1C 8.9 05/13/2017    Last seen for diabetes 6 months ago.  Management since then includes no changes. He reports good compliance with treatment. He is not having side effects.  Current symptoms include none and have been stable. Home blood sugar records: fasting range: 150s  Episodes of hypoglycemia? no   Most Recent Eye Exam: up to date  Weight trend: stable Prior visit with dietician: no Current diet: well balanced Current exercise: walking. He goes to the gym 5 days a week.   Pertinent Labs:    Component Value Date/Time   CHOL 140 09/03/2016 0919   TRIG 186 (H) 09/03/2016 0919   HDL 46 09/03/2016 0919   LDLCALC 57 09/03/2016 0919   CREATININE 1.70 (H) 05/13/2017 0845    Wt Readings from Last 3 Encounters:  05/16/18 234 lb (106.1 kg)  05/05/18 227 lb (103 kg)  04/10/18 225 lb (102.1 kg)    Hypertension, follow-up:  BP Readings from Last 3 Encounters:  05/16/18 122/68  05/05/18 118/72  04/10/18 131/69    He was last seen for hypertension 6 months ago.  BP at that visit was 118/68. Management since that visit includes no changes. He reports good compliance with treatment. He is not having side effects.  He is exercising. He is adherent to low salt diet.   Outside blood pressures are checked daily. He is experiencing none.  Patient denies exertional chest pressure/discomfort, lower extremity edema and palpitations.   Cardiovascular risk factors include diabetes mellitus and dyslipidemia.    Lipid/Cholesterol, Follow-up:   Last seen for this6 months ago.    Management changes since that visit include no changes. . Last Lipid Panel:    Component Value Date/Time   CHOL 140 09/03/2016 0919   TRIG 186 (H) 09/03/2016 0919   HDL 46 09/03/2016 0919   CHOLHDL 3.0 09/03/2016 0919   LDLCALC 57 09/03/2016 0919    Risk factors for vascular disease include diabetes mellitus and hypertension   He has had much more trouble with back lately limiting his walking, but still goes to gym 5 days a week. He starting seeing Dr. Lateef and had injection last week which he states helped quite a bit. He is anticipating having 2 more injections.   Allergies  Allergen Reactions  . Propoxyphene Anaphylaxis  . Codeine Sulfate     Patient denies  . Darvon [Propoxyphene Hcl] Other (See Comments)    Short of breath  . Demerol [Meperidine] Other (See Comments)    Short of breath  . Oxycodone Nausea Only     Current Outpatient Medications:  .  aspirin 81 MG tablet, Take 81 mg by mouth daily., Disp: , Rfl:  .  Blood Glucose Monitoring Suppl (ACCU-CHEK GUIDE) w/Device KIT, 1 Device by Does not apply route daily., Disp: 1 kit, Rfl: 0 .  doxazosin (CARDURA) 2 MG tablet, TAKE 1 TABLET BY MOUTH  DAILY, Disp: 90 tablet, Rfl: 4 .  fluticasone (FLONASE) 50 MCG/ACT nasal spray, USE 1 TO 2 SPRAYS IN   EACH  NOSTRIL EVERY DAY, Disp: 48 g, Rfl: 5 .  furosemide (LASIX) 40 MG tablet, TAKE 1 TABLET BY MOUTH  DAILY, Disp: 90 tablet, Rfl: 4 .  gabapentin (NEURONTIN) 300 MG capsule, TAKE 1 CAPSULE BY MOUTH TWO TIMES DAILY, Disp: 180 capsule, Rfl: 3 .  glimepiride (AMARYL) 1 MG tablet, TAKE 1 TABLET BY MOUTH  DAILY, Disp: 90 tablet, Rfl: 4 .  glucose blood (ACCU-CHEK GUIDE) test strip, Use to check blood sugar once daily for type 2 diabetes, e11.9, Disp: 100 each, Rfl: 4 .  HYDROcodone-acetaminophen (NORCO/VICODIN) 5-325 MG tablet, Take 1 tablet by mouth 2 (two) times daily., Disp: 60 tablet, Rfl: 0 .  ibuprofen (ADVIL,MOTRIN) 200 MG tablet, Take 200 mg by mouth every 6 (six) hours  as needed., Disp: , Rfl:  .  JARDIANCE 25 MG TABS tablet, TAKE 1 TABLET BY MOUTH  DAILY, Disp: 90 tablet, Rfl: 4 .  loratadine (CLARITIN) 10 MG tablet, Take 10 mg by mouth daily., Disp: , Rfl:  .  meloxicam (MOBIC) 15 MG tablet, Take 1 tablet (15 mg total) by mouth daily as needed., Disp: 90 tablet, Rfl: 3 .  metFORMIN (GLUCOPHAGE-XR) 500 MG 24 hr tablet, Take 1 tablet (500 mg total) by mouth daily., Disp: 3 tablet, Rfl: 0 .  metoprolol tartrate (LOPRESSOR) 25 MG tablet, TAKE 1 TABLET BY MOUTH TWO  TIMES DAILY, Disp: 180 tablet, Rfl: 4 .  mirabegron ER (MYRBETRIQ) 50 MG TB24 tablet, Take 50 mg by mouth daily., Disp: , Rfl:  .  mirtazapine (REMERON) 7.5 MG tablet, Take 7.5 mg by mouth at bedtime., Disp: , Rfl:  .  Multiple Vitamin (MULTIVITAMIN WITH MINERALS) TABS, Take 1 tablet by mouth daily., Disp: , Rfl:  .  omeprazole (PRILOSEC) 40 MG capsule, Take 1 capsule (40 mg total) by mouth daily., Disp: 90 capsule, Rfl: 3 .  oxybutynin (DITROPAN XL) 15 MG 24 hr tablet, TAKE 1 TABLET BY MOUTH AT  BEDTIME, Disp: 90 tablet, Rfl: 3 .  potassium chloride SA (K-DUR,KLOR-CON) 20 MEQ tablet, TAKE 1 TABLET BY MOUTH  DAILY, Disp: 90 tablet, Rfl: 3 .  simvastatin (ZOCOR) 40 MG tablet, TAKE 1 TABLET BY MOUTH AT  BEDTIME, Disp: 90 tablet, Rfl: 4 .  traZODone (DESYREL) 100 MG tablet, TAKE 0.5-1 TABLET BY MOUTH  AT BEDTIME AS NEEDED FOR  SLEEP., Disp: 90 tablet, Rfl: 4  Review of Systems  Constitutional: Negative for activity change, appetite change and chills.  Respiratory: Negative for cough and shortness of breath.   Cardiovascular: Negative for chest pain, palpitations and leg swelling.  Endocrine: Negative for cold intolerance, heat intolerance, polydipsia, polyphagia and polyuria.  Musculoskeletal: Positive for arthralgias and back pain.  Neurological: Negative for dizziness, light-headedness and headaches.    Social History   Tobacco Use  . Smoking status: Former Smoker    Packs/day: 1.00    Years:  25.00    Pack years: 25.00    Types: Cigarettes    Last attempt to quit: 07/09/1968    Years since quitting: 49.8  . Smokeless tobacco: Never Used  . Tobacco comment: quit at age 10  Substance Use Topics  . Alcohol use: No    Alcohol/week: 0.0 standard drinks   Objective:   BP 122/68 (BP Location: Left Arm, Patient Position: Sitting, Cuff Size: Normal)   Pulse 64   Temp 97.7 F (36.5 C)   Resp 16   Wt 234 lb (106.1 kg)   SpO2 98%   BMI 44.21 kg/m  Vitals:  05/16/18 0812  BP: 122/68  Pulse: 64  Resp: 16  Temp: 97.7 F (36.5 C)  SpO2: 98%  Weight: 234 lb (106.1 kg)     Physical Exam  General Appearance:    Alert, cooperative, no distress, obese  Eyes:    PERRL, conjunctiva/corneas clear, EOM's intact       Lungs:     Clear to auscultation bilaterally, respirations unlabored  Heart:    Regular rate and rhythm  Neurologic:   Awake, alert, oriented x 3. No apparent focal neurological           defect.           Assessment & Plan:     1. Hypercholesterolemia He is tolerating simvastatin well with no adverse effects.   - Lipid panel - Comprehensive metabolic panel - CBC  2. Type 2 diabetes mellitus with chronic kidney disease, without long-term current use of insulin, unspecified CKD stage ([place]) Doing well current medications.  - Hemoglobin A1c - CBC  3. Essential (primary) hypertension Well controlled.  Continue current medications.   - CBC  Follow up 4 months       [name], MD  [facility]

## 2018-05-17 LAB — COMPREHENSIVE METABOLIC PANEL
A/G RATIO: 2 (ref 1.2–2.2)
ALK PHOS: 53 IU/L (ref 39–117)
ALT: 36 IU/L (ref 0–44)
AST: 32 IU/L (ref 0–40)
Albumin: 4.3 g/dL (ref 3.5–4.7)
BUN/Creatinine Ratio: 15 (ref 10–24)
BUN: 26 mg/dL (ref 8–27)
Bilirubin Total: 0.4 mg/dL (ref 0.0–1.2)
CO2: 24 mmol/L (ref 20–29)
Calcium: 9.7 mg/dL (ref 8.6–10.2)
Chloride: 101 mmol/L (ref 96–106)
Creatinine, Ser: 1.7 mg/dL — ABNORMAL HIGH (ref 0.76–1.27)
GFR calc Af Amer: 43 mL/min/{1.73_m2} — ABNORMAL LOW (ref >59)
GFR calc non Af Amer: 37 mL/min/{1.73_m2} — ABNORMAL LOW (ref >59)
GLOBULIN, TOTAL: 2.2 g/dL (ref 1.5–4.5)
Glucose: 138 mg/dL — ABNORMAL HIGH (ref 65–99)
POTASSIUM: 4.4 mmol/L (ref 3.5–5.2)
SODIUM: 141 mmol/L (ref 134–144)
Total Protein: 6.5 g/dL (ref 6.0–8.5)

## 2018-05-17 LAB — CBC
HEMATOCRIT: 37 % — AB (ref 37.5–51.0)
Hemoglobin: 12.4 g/dL — ABNORMAL LOW (ref 13.0–17.7)
MCH: 32.1 pg (ref 26.6–33.0)
MCHC: 33.5 g/dL (ref 31.5–35.7)
MCV: 96 fL (ref 79–97)
Platelets: 98 10*3/uL — CL (ref 150–450)
RBC: 3.86 x10E6/uL — ABNORMAL LOW (ref 4.14–5.80)
RDW: 13.9 % (ref 12.3–15.4)
WBC: 5.2 10*3/uL (ref 3.4–10.8)

## 2018-05-17 LAB — HEMOGLOBIN A1C
ESTIMATED AVERAGE GLUCOSE: 174 mg/dL
Hgb A1c MFr Bld: 7.7 % — ABNORMAL HIGH (ref 4.8–5.6)

## 2018-05-17 LAB — LIPID PANEL
CHOLESTEROL TOTAL: 146 mg/dL (ref 100–199)
Chol/HDL Ratio: 3.5 ratio (ref 0.0–5.0)
HDL: 42 mg/dL (ref >39)
LDL Calculated: 57 mg/dL (ref 0–99)
Triglycerides: 235 mg/dL — ABNORMAL HIGH (ref 0–149)
VLDL CHOLESTEROL CAL: 47 mg/dL — AB (ref 5–40)

## 2018-05-21 ENCOUNTER — Telehealth: Payer: Self-pay

## 2018-05-21 ENCOUNTER — Encounter: Payer: Self-pay | Admitting: Family Medicine

## 2018-05-21 DIAGNOSIS — N1832 Chronic kidney disease, stage 3b: Secondary | ICD-10-CM | POA: Insufficient documentation

## 2018-05-21 DIAGNOSIS — N184 Chronic kidney disease, stage 4 (severe): Secondary | ICD-10-CM | POA: Insufficient documentation

## 2018-05-21 DIAGNOSIS — N183 Chronic kidney disease, stage 3 (moderate): Secondary | ICD-10-CM

## 2018-05-21 NOTE — Telephone Encounter (Signed)
-----   Message from Birdie Sons, MD sent at 05/21/2018  7:59 AM EST ----- a1c a litlte high, but better at 7.7. Cholesterol is very good at 146. Kidney functions stable. Continue current medications.  Follow up in March as scheduled.

## 2018-05-21 NOTE — Telephone Encounter (Signed)
Pt advised.   Thanks,   -Zyrah Wiswell  

## 2018-05-29 ENCOUNTER — Other Ambulatory Visit: Payer: Self-pay

## 2018-05-29 ENCOUNTER — Ambulatory Visit
Payer: Medicare Other | Attending: Student in an Organized Health Care Education/Training Program | Admitting: Student in an Organized Health Care Education/Training Program

## 2018-05-29 ENCOUNTER — Encounter: Payer: Self-pay | Admitting: Student in an Organized Health Care Education/Training Program

## 2018-05-29 VITALS — BP 125/49 | HR 74 | Temp 97.6°F | Resp 18 | Ht 61.0 in | Wt 229.0 lb

## 2018-05-29 DIAGNOSIS — I509 Heart failure, unspecified: Secondary | ICD-10-CM | POA: Diagnosis not present

## 2018-05-29 DIAGNOSIS — Z7982 Long term (current) use of aspirin: Secondary | ICD-10-CM | POA: Insufficient documentation

## 2018-05-29 DIAGNOSIS — M48062 Spinal stenosis, lumbar region with neurogenic claudication: Secondary | ICD-10-CM | POA: Insufficient documentation

## 2018-05-29 DIAGNOSIS — M48061 Spinal stenosis, lumbar region without neurogenic claudication: Secondary | ICD-10-CM

## 2018-05-29 DIAGNOSIS — E1122 Type 2 diabetes mellitus with diabetic chronic kidney disease: Secondary | ICD-10-CM | POA: Diagnosis not present

## 2018-05-29 DIAGNOSIS — M5116 Intervertebral disc disorders with radiculopathy, lumbar region: Secondary | ICD-10-CM | POA: Insufficient documentation

## 2018-05-29 DIAGNOSIS — G5600 Carpal tunnel syndrome, unspecified upper limb: Secondary | ICD-10-CM | POA: Insufficient documentation

## 2018-05-29 DIAGNOSIS — M5416 Radiculopathy, lumbar region: Secondary | ICD-10-CM

## 2018-05-29 DIAGNOSIS — Z5181 Encounter for therapeutic drug level monitoring: Secondary | ICD-10-CM | POA: Insufficient documentation

## 2018-05-29 DIAGNOSIS — M4802 Spinal stenosis, cervical region: Secondary | ICD-10-CM | POA: Diagnosis not present

## 2018-05-29 DIAGNOSIS — M47896 Other spondylosis, lumbar region: Secondary | ICD-10-CM | POA: Diagnosis not present

## 2018-05-29 DIAGNOSIS — M19071 Primary osteoarthritis, right ankle and foot: Secondary | ICD-10-CM | POA: Diagnosis not present

## 2018-05-29 DIAGNOSIS — G4733 Obstructive sleep apnea (adult) (pediatric): Secondary | ICD-10-CM | POA: Insufficient documentation

## 2018-05-29 DIAGNOSIS — Z951 Presence of aortocoronary bypass graft: Secondary | ICD-10-CM | POA: Insufficient documentation

## 2018-05-29 DIAGNOSIS — Z96619 Presence of unspecified artificial shoulder joint: Secondary | ICD-10-CM | POA: Insufficient documentation

## 2018-05-29 DIAGNOSIS — Z791 Long term (current) use of non-steroidal anti-inflammatories (NSAID): Secondary | ICD-10-CM | POA: Insufficient documentation

## 2018-05-29 DIAGNOSIS — Z9889 Other specified postprocedural states: Secondary | ICD-10-CM

## 2018-05-29 DIAGNOSIS — Z87891 Personal history of nicotine dependence: Secondary | ICD-10-CM | POA: Insufficient documentation

## 2018-05-29 DIAGNOSIS — K760 Fatty (change of) liver, not elsewhere classified: Secondary | ICD-10-CM | POA: Diagnosis not present

## 2018-05-29 DIAGNOSIS — G894 Chronic pain syndrome: Secondary | ICD-10-CM | POA: Insufficient documentation

## 2018-05-29 DIAGNOSIS — N183 Chronic kidney disease, stage 3 (moderate): Secondary | ICD-10-CM | POA: Insufficient documentation

## 2018-05-29 DIAGNOSIS — M5030 Other cervical disc degeneration, unspecified cervical region: Secondary | ICD-10-CM | POA: Diagnosis not present

## 2018-05-29 DIAGNOSIS — Z8249 Family history of ischemic heart disease and other diseases of the circulatory system: Secondary | ICD-10-CM | POA: Insufficient documentation

## 2018-05-29 DIAGNOSIS — K2950 Unspecified chronic gastritis without bleeding: Secondary | ICD-10-CM | POA: Insufficient documentation

## 2018-05-29 DIAGNOSIS — G4700 Insomnia, unspecified: Secondary | ICD-10-CM | POA: Insufficient documentation

## 2018-05-29 DIAGNOSIS — J309 Allergic rhinitis, unspecified: Secondary | ICD-10-CM | POA: Insufficient documentation

## 2018-05-29 DIAGNOSIS — K219 Gastro-esophageal reflux disease without esophagitis: Secondary | ICD-10-CM | POA: Diagnosis not present

## 2018-05-29 DIAGNOSIS — Z7984 Long term (current) use of oral hypoglycemic drugs: Secondary | ICD-10-CM | POA: Insufficient documentation

## 2018-05-29 DIAGNOSIS — Z882 Allergy status to sulfonamides status: Secondary | ICD-10-CM | POA: Insufficient documentation

## 2018-05-29 DIAGNOSIS — I130 Hypertensive heart and chronic kidney disease with heart failure and stage 1 through stage 4 chronic kidney disease, or unspecified chronic kidney disease: Secondary | ICD-10-CM | POA: Insufficient documentation

## 2018-05-29 DIAGNOSIS — M5412 Radiculopathy, cervical region: Secondary | ICD-10-CM | POA: Diagnosis not present

## 2018-05-29 DIAGNOSIS — B9689 Other specified bacterial agents as the cause of diseases classified elsewhere: Secondary | ICD-10-CM | POA: Diagnosis not present

## 2018-05-29 DIAGNOSIS — Z6841 Body Mass Index (BMI) 40.0 and over, adult: Secondary | ICD-10-CM | POA: Insufficient documentation

## 2018-05-29 DIAGNOSIS — Z79899 Other long term (current) drug therapy: Secondary | ICD-10-CM | POA: Insufficient documentation

## 2018-05-29 DIAGNOSIS — E7800 Pure hypercholesterolemia, unspecified: Secondary | ICD-10-CM | POA: Insufficient documentation

## 2018-05-29 DIAGNOSIS — Z888 Allergy status to other drugs, medicaments and biological substances status: Secondary | ICD-10-CM | POA: Insufficient documentation

## 2018-05-29 DIAGNOSIS — N400 Enlarged prostate without lower urinary tract symptoms: Secondary | ICD-10-CM | POA: Diagnosis not present

## 2018-05-29 DIAGNOSIS — Z9049 Acquired absence of other specified parts of digestive tract: Secondary | ICD-10-CM | POA: Insufficient documentation

## 2018-05-29 DIAGNOSIS — M1612 Unilateral primary osteoarthritis, left hip: Secondary | ICD-10-CM | POA: Insufficient documentation

## 2018-05-29 DIAGNOSIS — Z96641 Presence of right artificial hip joint: Secondary | ICD-10-CM | POA: Insufficient documentation

## 2018-05-29 DIAGNOSIS — I25119 Atherosclerotic heart disease of native coronary artery with unspecified angina pectoris: Secondary | ICD-10-CM | POA: Insufficient documentation

## 2018-05-29 MED ORDER — HYDROCODONE-ACETAMINOPHEN 5-325 MG PO TABS: 1.0000 | ORAL_TABLET | Freq: Two times a day (BID) | ORAL | 0 refills | Status: DC

## 2018-05-29 MED ORDER — HYDROCODONE-ACETAMINOPHEN 5-325 MG PO TABS: 1.0000 | ORAL_TABLET | Freq: Two times a day (BID) | ORAL | 0 refills | Status: AC

## 2018-05-29 NOTE — Progress Notes (Signed)
Patient's Name: Mark Terry.  MRN: [id number]  Referring Provider: Birdie Sons, MD  DOB: 02/27/1935  PCP: Mark Sons, MD  DOS: 05/29/2018  Note by: Mark Santa, MD  Service setting: Ambulatory outpatient  Specialty: Interventional Pain Management  Location: [facility] (AMB) Pain Management Facility    Patient type: Established   Primary Reason(s) for Visit: Encounter for prescription drug management & post-procedure evaluation of chronic illness with mild to moderate exacerbation(Level of risk: moderate) CC: Back Pain (lower)  HPI  Mark Terry is a 83 y.o. year old, male patient, who comes today for a post-procedure evaluation and medication management. He has Allergic rhinitis; Angina pectoris ([place]); Arthritis; Arthropathia; At risk for falling; Back pain with radiation; BPH (benign prostatic hyperplasia); CAD (coronary artery disease); Carpal tunnel syndrome; Cervical radiculitis; Cervical spinal stenosis; CHF (congestive heart failure) ([place]); Chronic gastritis; DDD (degenerative disc disease), cervical; Diabetes mellitus with nephropathy ([place]); Essential (primary) hypertension; Fatty infiltration of liver; GERD (gastroesophageal reflux disease); History of colonic polyps; Hypercholesterolemia; Decreased potassium in the blood; Neuritis or radiculitis due to rupture of lumbar intervertebral disc; Morbid obesity ([place]); Cervical pain; Pilonidal cyst; Skin lesion; OSA on CPAP; Change in blood platelet count; Insomnia; Arthritis of ankle, right; Varus foot deformity, acquired; Arthritis of ankle or foot, degenerative; Urge incontinence; Nocturia; Urinary frequency; DDD (degenerative disc disease), lumbar; Primary osteoarthritis of left hip; Status post total replacement of right hip; Lumbar spondylosis; Dysphagia; Chronic kidney disease (CKD), active medical management without dialysis, stage 3 (moderate) ([place]); Chronic pain syndrome; Lumbar radiculopathy; and History of lumbar laminectomy for  spinal cord decompression (L2/3) on their problem list. His primarily concern today is the Back Pain (lower)  Pain Assessment: Location: Lower Back Radiating: both lower legs Onset: More than a month ago Duration: Chronic pain Quality: Dull Severity: 5 /10 (subjective, self-reported pain score)  Note: Reported level is compatible with observation.                         When using our objective Pain Scale, levels between 6 and 10/10 are said to belong in an emergency room, as it progressively worsens from a 6/10, described as severely limiting, requiring emergency care not usually available at an outpatient pain management facility. At a 6/10 level, communication becomes difficult and requires great effort. Assistance to reach the emergency department may be required. Facial flushing and profuse sweating along with potentially dangerous increases in heart rate and blood pressure will be evident. Effect on ADL:   Timing: Intermittent Modifying factors: sitting or lying down BP: (!) 125/49  HR: 74  Mark Terry was last seen on 05/06/2018 for a procedure. During today's appointment we reviewed Mark Terry post-procedure results, as well as his outpatient medication regimen.  Further details on both, my assessment(s), as well as the proposed treatment plan, please see below.  Controlled Substance Pharmacotherapy Assessment REMS (Risk Evaluation and Mitigation Strategy)  Analgesic: Hydrocodone 5 mg twice daily as needed, quantity 60/month MME/day: 10 mg/day.  Mark Martins, RN  05/29/2018 10:19 AM  Sign at close encounter Nursing Pain Medication Assessment:  Safety precautions to be maintained throughout the outpatient stay will include: orient to surroundings, keep bed in low position, maintain call bell within reach at all times, provide assistance with transfer out of bed and ambulation.  Medication Inspection Compliance: Mark Terry did not comply with our request to bring his pills to  be counted. He was reminded that bringing the medication  bottles, even when empty, is a requirement.  Medication: None brought in. Pill/Patch Count: None available to be counted. Bottle Appearance: No container available. Did not bring bottle(s) to appointment. Filled Date: N/A Last Medication intake:  Today   Pharmacokinetics: Liberation and absorption (onset of action): WNL Distribution (time to peak effect): WNL Metabolism and excretion (duration of action): WNL         Pharmacodynamics: Desired effects: Analgesia: Mark Terry reports >50% benefit. Functional ability: Patient reports that medication allows him to accomplish basic ADLs Clinically meaningful improvement in function (CMIF): Sustained CMIF goals met Perceived effectiveness: Described as relatively effective, allowing for increase in activities of daily living (ADL) Undesirable effects: Side-effects or Adverse reactions: None reported Monitoring: [place]MP: Online review of the past 85-monthperiod conducted. Compliant with practice rules and regulations Last UDS on record: Summary  Date Value Ref Range Status  05/05/2018 FINAL  Final    Comment:    ==================================================================== TOXASSURE COMP DRUG ANALYSIS,UR ==================================================================== Test                             Result       Flag       Units Drug Present and Declared for Prescription Verification   Gabapentin                     PRESENT      EXPECTED   Trazodone                      PRESENT      EXPECTED   1,3 chlorophenyl piperazine    PRESENT      EXPECTED    1,3-chlorophenyl piperazine is an expected metabolite of    trazodone.   Metoprolol                     PRESENT      EXPECTED Drug Present not Declared for Prescription Verification   Naproxen                       PRESENT      UNEXPECTED Drug Absent but Declared for Prescription Verification   Hydrocodone                     Not Detected UNEXPECTED ng/mg creat   Mirtazapine                    Not Detected UNEXPECTED   Acetaminophen                  Not Detected UNEXPECTED    Acetaminophen, as indicated in the declared medication list, is    not always detected even when used as directed.   Salicylate                     Not Detected UNEXPECTED    Aspirin, as indicated in the declared medication list, is not    always detected even when used as directed.   Ibuprofen                      Not Detected UNEXPECTED    Ibuprofen, as indicated in the declared medication list, is not    always detected even when used as directed. ==================================================================== Test  Result    Flag   Units      Ref Range   Creatinine              105              mg/dL      >=20 ==================================================================== Declared Medications:  The flagging and interpretation on this report are based on the  following declared medications.  Unexpected results may arise from  inaccuracies in the declared medications.  **Note: The testing scope of this panel includes these medications:  Gabapentin  Hydrocodone (Hydrocodone-Acetaminophen)  Metoprolol  Mirtazapine  Trazodone  **Note: The testing scope of this panel does not include small to  moderate amounts of these reported medications:  Acetaminophen (Hydrocodone-Acetaminophen)  Aspirin (Aspirin 81)  Ibuprofen  **Note: The testing scope of this panel does not include following  reported medications:  Doxazosin  Empagliflozin  Fluticasone  Furosemide  Glimepiride  Loratadine  Meloxicam  Metformin  Mirabegron  Multivitamin (MVI)  Omeprazole  Oxybutynin  Potassium  Simvastatin ==================================================================== For clinical consultation, please call [phone]. ====================================================================    UDS  interpretation: Compliant          Medication Assessment Form: Reviewed. Patient indicates being compliant with therapy Treatment compliance: Compliant Risk Assessment Profile: Aberrant behavior: See prior evaluations. None observed or detected today Comorbid factors increasing risk of overdose: See prior notes. No additional risks detected today Opioid risk tool (ORT) (Total Score): 0 Personal History of Substance Abuse (SUD-Substance use disorder):  Alcohol: Negative  Illegal Drugs: Negative  Rx Drugs: Negative  ORT Risk Level calculation: Low Risk Risk of substance use disorder (SUD): Low Opioid Risk Tool - 05/29/18 1019      Family History of Substance Abuse   Alcohol  Negative    Illegal Drugs  Negative    Rx Drugs  Negative      Personal History of Substance Abuse   Alcohol  Negative    Illegal Drugs  Negative    Rx Drugs  Negative      Age   Age between 32-45 years   No      History of Preadolescent Sexual Abuse   History of Preadolescent Sexual Abuse  Negative or Male      Psychological Disease   Psychological Disease  Negative    Depression  Negative      Total Score   Opioid Risk Tool Scoring  0    Opioid Risk Interpretation  Low Risk      ORT Scoring interpretation table:  Score <3 = Low Risk for SUD  Score between 4-7 = Moderate Risk for SUD  Score >8 = High Risk for Opioid Abuse   Risk Mitigation Strategies:  Patient Counseling: Covered Patient-Prescriber Agreement (PPA): Present and active  Notification to other healthcare providers: Done  Pharmacologic Plan: No change in therapy, at this time.             Post-Procedure Assessment  05/05/2018 Procedure: L5-S1 ESI Pre-procedure pain score:  7/10 Post-procedure pain score: 0/10         Influential Factors: BMI: 43.27 kg/m Intra-procedural challenges: None observed.         Assessment challenges: None detected.              Reported side-effects: None.        Post-procedural adverse reactions  or complications: None reported         Sedation: Please see nurses note. When no sedatives are used, the  analgesic levels obtained are directly associated to the effectiveness of the local anesthetics. However, when sedation is provided, the level of analgesia obtained during the initial 1 hour following the intervention, is believed to be the result of a combination of factors. These factors may include, but are not limited to: 1. The effectiveness of the local anesthetics used. 2. The effects of the analgesic(s) and/or anxiolytic(s) used. 3. The degree of discomfort experienced by the patient at the time of the procedure. 4. The patients ability and reliability in recalling and recording the events. 5. The presence and influence of possible secondary gains and/or psychosocial factors. Reported result: Relief experienced during the 1st hour after the procedure: 50 % (Ultra-Short Term Relief)            Interpretative annotation: Clinically appropriate result. Analgesia during this period is likely to be Local Anesthetic and/or IV Sedative (Analgesic/Anxiolytic) related.          Effects of local anesthetic: The analgesic effects attained during this period are directly associated to the localized infiltration of local anesthetics and therefore cary significant diagnostic value as to the etiological location, or anatomical origin, of the pain. Expected duration of relief is directly dependent on the pharmacodynamics of the local anesthetic used. Long-acting (4-6 hours) anesthetics used.  Reported result: Relief during the next 4 to 6 hour after the procedure: 50 % (Short-Term Relief)            Interpretative annotation: Clinically appropriate result. Analgesia during this period is likely to be Local Anesthetic-related.          Long-term benefit: Defined as the period of time past the expected duration of local anesthetics (1 hour for short-acting and 4-6 hours for long-acting). With the possible  exception of prolonged sympathetic blockade from the local anesthetics, benefits during this period are typically attributed to, or associated with, other factors such as analgesic sensory neuropraxia, antiinflammatory effects, or beneficial biochemical changes provided by agents other than the local anesthetics.  Reported result: Extended relief following procedure: 25 % (Long-Term Relief)            Interpretative annotation: Clinically possible results. Good relief. No permanent benefit expected. Inflammation plays a part in the etiology to the pain.          Current benefits: Defined as reported results that persistent at this point in time.   Analgesia: 25-50 %            Function: Somewhat improved ROM: Somewhat improved Interpretative annotation: Recurrence of symptoms. No permanent benefit expected. Effective diagnostic intervention.          Interpretation: Results would suggest a successful diagnostic intervention.                  Plan:  Repeat treatment or therapy and compare extent and duration of benefits.                Laboratory Chemistry  Inflammation Markers (CRP: Acute Phase) (ESR: Chronic Phase) No results found for: CRP, ESRSEDRATE, LATICACIDVEN                       Rheumatology Markers No results found for: RF, ANA, LABURIC, URICUR, LYMEIGGIGMAB, LYMEABIGMQN, HLAB27                      Renal Function Markers Lab Results  Component Value Date   BUN 26 05/16/2018   CREATININE 1.70 (H) 05/16/2018  BCR 15 05/16/2018   GFRAA 43 (L) 05/16/2018   GFRNONAA 37 (L) 05/16/2018                             Hepatic Function Markers Lab Results  Component Value Date   AST 32 05/16/2018   ALT 36 05/16/2018   ALBUMIN 4.3 05/16/2018   ALKPHOS 53 05/16/2018                        Electrolytes Lab Results  Component Value Date   NA 141 05/16/2018   K 4.4 05/16/2018   CL 101 05/16/2018   CALCIUM 9.7 05/16/2018   PHOS 3.0 05/13/2017                         Neuropathy Markers Lab Results  Component Value Date   VITAMINB12 728 10/10/2015   HGBA1C 7.7 (H) 05/16/2018                        CNS Tests No results found for: COLORCSF, APPEARCSF, RBCCOUNTCSF, WBCCSF, POLYSCSF, LYMPHSCSF, EOSCSF, PROTEINCSF, GLUCCSF, JCVIRUS, CSFOLI, IGGCSF                      Bone Pathology Markers Lab Results  Component Value Date   VD25OH 47.6 10/10/2015                         Coagulation Parameters Lab Results  Component Value Date   INR 0.99 05/27/2012   LABPROT 13.0 05/27/2012   APTT 31 05/27/2012   PLT 98 (LL) 05/16/2018   LABHEMA Note: 05/16/2018                        Cardiovascular Markers Lab Results  Component Value Date   TROPONINI <0.03 11/05/2015   HGB 12.4 (L) 05/16/2018   HCT 37.0 (L) 05/16/2018                         CA Markers No results found for: CEA, CA125, LABCA2                      Note: Lab results reviewed.  Recent Diagnostic Imaging Results  DG C-Arm 1-60 Min-No Report Fluoroscopy was utilized by the requesting physician.  No radiographic  interpretation.   Complexity Note: Imaging results reviewed. Results shared with Mr. Staffa, using Layman's terms.                         Meds   Current Outpatient Medications:  .  aspirin 81 MG tablet, Take 81 mg by mouth daily., Disp: , Rfl:  .  Blood Glucose Monitoring Suppl (ACCU-CHEK GUIDE) w/Device KIT, 1 Device by Does not apply route daily., Disp: 1 kit, Rfl: 0 .  doxazosin (CARDURA) 2 MG tablet, TAKE 1 TABLET BY MOUTH  DAILY, Disp: 90 tablet, Rfl: 4 .  fluticasone (FLONASE) 50 MCG/ACT nasal spray, USE 1 TO 2 SPRAYS IN EACH  NOSTRIL EVERY DAY, Disp: 48 g, Rfl: 5 .  furosemide (LASIX) 40 MG tablet, TAKE 1 TABLET BY MOUTH  DAILY, Disp: 90 tablet, Rfl: 4 .  gabapentin (NEURONTIN) 300 MG capsule, TAKE 1 CAPSULE BY MOUTH TWO TIMES DAILY, Disp: 180 capsule, Rfl: 3 .  glimepiride (AMARYL) 1 MG tablet, TAKE 1 TABLET BY MOUTH  DAILY, Disp: 90 tablet, Rfl: 4 .  glucose  blood (ACCU-CHEK GUIDE) test strip, Use to check blood sugar once daily for type 2 diabetes, e11.9, Disp: 100 each, Rfl: 4 .  HYDROcodone-acetaminophen (NORCO/VICODIN) 5-325 MG tablet, Take 1 tablet by mouth 2 (two) times daily. For chronic pain.  To fill on or after: 06/04/2018, 07/03/2018, 08/02/2018, Disp: 60 tablet, Rfl: 0 .  ibuprofen (ADVIL,MOTRIN) 200 MG tablet, Take 200 mg by mouth every 6 (six) hours as needed., Disp: , Rfl:  .  JARDIANCE 25 MG TABS tablet, TAKE 1 TABLET BY MOUTH  DAILY, Disp: 90 tablet, Rfl: 4 .  loratadine (CLARITIN) 10 MG tablet, Take 10 mg by mouth daily., Disp: , Rfl:  .  meloxicam (MOBIC) 15 MG tablet, Take 1 tablet (15 mg total) by mouth daily as needed., Disp: 90 tablet, Rfl: 3 .  metFORMIN (GLUCOPHAGE-XR) 500 MG 24 hr tablet, Take 1 tablet (500 mg total) by mouth daily., Disp: 3 tablet, Rfl: 0 .  metoprolol tartrate (LOPRESSOR) 25 MG tablet, TAKE 1 TABLET BY MOUTH TWO  TIMES DAILY, Disp: 180 tablet, Rfl: 4 .  mirabegron ER (MYRBETRIQ) 50 MG TB24 tablet, Take 50 mg by mouth daily., Disp: , Rfl:  .  mirtazapine (REMERON) 7.5 MG tablet, Take 7.5 mg by mouth at bedtime., Disp: , Rfl:  .  Multiple Vitamin (MULTIVITAMIN WITH MINERALS) TABS, Take 1 tablet by mouth daily., Disp: , Rfl:  .  omeprazole (PRILOSEC) 40 MG capsule, Take 1 capsule (40 mg total) by mouth daily., Disp: 90 capsule, Rfl: 3 .  oxybutynin (DITROPAN XL) 15 MG 24 hr tablet, TAKE 1 TABLET BY MOUTH AT  BEDTIME, Disp: 90 tablet, Rfl: 3 .  potassium chloride SA (K-DUR,KLOR-CON) 20 MEQ tablet, TAKE 1 TABLET BY MOUTH  DAILY, Disp: 90 tablet, Rfl: 3 .  simvastatin (ZOCOR) 40 MG tablet, TAKE 1 TABLET BY MOUTH AT  BEDTIME, Disp: 90 tablet, Rfl: 4 .  traZODone (DESYREL) 100 MG tablet, TAKE 0.5-1 TABLET BY MOUTH  AT BEDTIME AS NEEDED FOR  SLEEP., Disp: 90 tablet, Rfl: 4  ROS  Constitutional: Denies any fever or chills Gastrointestinal: No reported hemesis, hematochezia, vomiting, or acute GI  distress Musculoskeletal: Denies any acute onset joint swelling, redness, loss of ROM, or weakness Neurological: No reported episodes of acute onset apraxia, aphasia, dysarthria, agnosia, amnesia, paralysis, loss of coordination, or loss of consciousness  Allergies  Mr. Resende is allergic to propoxyphene; codeine sulfate; darvon [propoxyphene hcl]; demerol [meperidine]; and oxycodone.  [place]  Drug: Mr. Deviney  reports that he does not use drugs. Alcohol:  reports that he does not drink alcohol. Tobacco:  reports that he quit smoking about 49 years ago. His smoking use included cigarettes. He has a 25.00 pack-year smoking history. He has never used smokeless tobacco. Medical:  has a past medical history of Anemia, Bruises easily, Diabetes mellitus without complication ([place]), H/O esophagogastroduodenoscopy, H/O hiatal hernia, History of blood transfusion, History of bronchitis, History of gastric ulcer, History of MRSA infection, Leg cramps, Melanoma ([place]), Panic attacks, Pneumonia, and PONV (postoperative nausea and vomiting). Surgical: Mr. Demarest  has a past surgical history that includes Cholecystectomy (1970); Replacement total knee bilateral ('86 and 2000); Total hip arthroplasty (Right, 2012); Neck surgery; Coronary artery bypass graft (2000); Colonoscopy; bilateral cataract surgery; Reverse shoulder arthroplasty (05/30/2012); Back surgery; Ankle Fusion (Right, 11/04/2015); prostate laser surgery; Coronary angioplasty; Joint replacement; and Esophagogastroduodenoscopy (egd) with propofol (N/A, 11/18/2017). Family:  family history includes Alzheimer's disease in his mother; Bipolar disorder in his brother; Heart attack in his father.  Constitutional Exam  General appearance: Well nourished, well developed, and well hydrated. In no apparent acute distress Vitals:   05/29/18 1014  BP: (!) 125/49  Pulse: 74  Resp: 18  Temp: 97.6 F (36.4 C)  TempSrc: Oral  SpO2: 96%  Weight: 229 lb (103.9 kg)   Height: '5\' 1"'  (1.549 m)   BMI Assessment: Estimated body mass index is 43.27 kg/m as calculated from the following:   Height as of this encounter: '5\' 1"'  (1.549 m).   Weight as of this encounter: 229 lb (103.9 kg).  BMI interpretation table: BMI level Category Range association with higher incidence of chronic pain  <18 kg/m2 Underweight   18.5-24.9 kg/m2 Ideal body weight   25-29.9 kg/m2 Overweight Increased incidence by 20%  30-34.9 kg/m2 Obese (Class I) Increased incidence by 68%  35-39.9 kg/m2 Severe obesity (Class II) Increased incidence by 136%  >40 kg/m2 Extreme obesity (Class III) Increased incidence by 254%   Patient's current BMI Ideal Body weight  Body mass index is 43.27 kg/m. Ideal body weight: 52.3 kg (115 lb 4.8 oz) Adjusted ideal body weight: 72.9 kg (160 lb 12.5 oz)   BMI Readings from Last 4 Encounters:  05/29/18 43.27 kg/m  05/16/18 44.21 kg/m  05/05/18 42.89 kg/m  04/10/18 42.51 kg/m   Wt Readings from Last 4 Encounters:  05/29/18 229 lb (103.9 kg)  05/16/18 234 lb (106.1 kg)  05/05/18 227 lb (103 kg)  04/10/18 225 lb (102.1 kg)  Psych/Mental status: Alert, oriented x 3 (person, place, & time)       Eyes: PERLA Respiratory: No evidence of acute respiratory distress  Cervical Spine Area Exam  Skin & Axial Inspection: No masses, redness, edema, swelling, or associated skin lesions Alignment: Symmetrical Functional ROM: Unrestricted ROM      Stability: No instability detected Muscle Tone/Strength: Functionally intact. No obvious neuro-muscular anomalies detected. Sensory (Neurological): Unimpaired Palpation: No palpable anomalies              Upper Extremity (UE) Exam    Side: Right upper extremity  Side: Left upper extremity  Skin & Extremity Inspection: Skin color, temperature, and hair growth are WNL. No peripheral edema or cyanosis. No masses, redness, swelling, asymmetry, or associated skin lesions. No contractures.  Skin & Extremity  Inspection: Skin color, temperature, and hair growth are WNL. No peripheral edema or cyanosis. No masses, redness, swelling, asymmetry, or associated skin lesions. No contractures.  Functional ROM: Unrestricted ROM          Functional ROM: Unrestricted ROM          Muscle Tone/Strength: Functionally intact. No obvious neuro-muscular anomalies detected.  Muscle Tone/Strength: Functionally intact. No obvious neuro-muscular anomalies detected.  Sensory (Neurological): Unimpaired          Sensory (Neurological): Unimpaired          Palpation: No palpable anomalies              Palpation: No palpable anomalies              Provocative Test(s):  Phalen's test: deferred Tinel's test: deferred Apley's scratch test (touch opposite shoulder):  Action 1 (Across chest): deferred Action 2 (Overhead): deferred Action 3 (LB reach): deferred   Provocative Test(s):  Phalen's test: deferred Tinel's test: deferred Apley's scratch test (touch opposite shoulder):  Action 1 (Across chest): deferred Action 2 (Overhead): deferred Action 3 (LB  reach): deferred    Thoracic Spine Area Exam  Skin & Axial Inspection: No masses, redness, or swelling Alignment: Symmetrical Functional ROM: Unrestricted ROM Stability: No instability detected Muscle Tone/Strength: Functionally intact. No obvious neuro-muscular anomalies detected. Sensory (Neurological): Unimpaired Muscle strength & Tone: No palpable anomalies   Lumbar Spine Area Exam  Skin & Axial Inspection: Well healed scar from previous spine surgery detected Alignment: Asymmetric Functional ROM: Decreased ROM affecting both sides Stability: No instability detected Muscle Tone/Strength: Functionally intact. No obvious neuro-muscular anomalies detected. Sensory (Neurological): Dermatomal pain pattern and musculoskeletal Palpation: Complains of area being tender to palpation       Provocative Tests: Hyperextension/rotation test: (+) bilaterally for facet joint  pain. Lumbar quadrant test (Kemp's test): (+) bilaterally for facet joint pain. Lateral bending test: (+) ipsilateral radicular pain, bilaterally. Positive for bilateral foraminal stenosis., Improved after treatment Patrick's Maneuver: deferred today                   FABER test: deferred today                   S-I anterior distraction/compression test: deferred today         S-I lateral compression test: deferred today         S-I Thigh-thrust test: deferred today         S-I Gaenslen's test: deferred today          Gait & Posture Assessment  Ambulation: Patient ambulates using a Guess Gait: Limited. Using assistive device to ambulate Posture: Difficulty standing up straight, due to pain   Lower Extremity Exam    Side: Right lower extremity  Side: Left lower extremity  Stability: No instability observed          Stability: No instability observed          Skin & Extremity Inspection: Skin color, temperature, and hair growth are WNL. No peripheral edema or cyanosis. No masses, redness, swelling, asymmetry, or associated skin lesions. No contractures.  Skin & Extremity Inspection: Skin color, temperature, and hair growth are WNL. No peripheral edema or cyanosis. No masses, redness, swelling, asymmetry, or associated skin lesions. No contractures.  Functional ROM: Decreased ROM for hip and knee joints          Functional ROM: Decreased ROM for hip and knee joints          Muscle Tone/Strength: Functionally intact. No obvious neuro-muscular anomalies detected.  Muscle Tone/Strength: Functionally intact. No obvious neuro-muscular anomalies detected.  Sensory (Neurological): Dermatomal pain pattern  Sensory (Neurological): Dermatomal pain pattern  Palpation: No palpable anomalies  Palpation: No palpable anomalies    Palpation: No palpable anomalies  Palpation: No palpable anomalies   Assessment  Primary Diagnosis & Pertinent Problem List: The primary encounter diagnosis was  Chronic pain syndrome. Diagnoses of Lumbar radiculopathy, History of lumbar laminectomy for spinal cord decompression (L2/3), Spinal stenosis, lumbar region, with neurogenic claudication, and Lumbar foraminal stenosis were also pertinent to this visit.  Status Diagnosis  Persistent Responding Persistent 1. Chronic pain syndrome   2. Lumbar radiculopathy   3. History of lumbar laminectomy for spinal cord decompression (L2/3)   4. Spinal stenosis, lumbar region, with neurogenic claudication   5. Lumbar foraminal stenosis     Problems updated and reviewed during this visit: Problem  Chronic Pain Syndrome  Lumbar Radiculopathy  History of lumbar laminectomy for spinal cord decompression (L2/3)    Serum toxicology screen today for compliance and monitoring Refill of hydrocodone  as below for 3 months Repeat lumbar epidural steroid injection #2 at L5-S1    Plan of Care  Pharmacotherapy (Medications Ordered): Meds ordered this encounter  Medications  . DISCONTD: HYDROcodone-acetaminophen (NORCO/VICODIN) 5-325 MG tablet    Sig: Take 1 tablet by mouth 2 (two) times daily. For chronic pain.  To fill on or after: 06/04/2018, 07/03/2018, 08/02/2018    Dispense:  60 tablet    Refill:  0  . DISCONTD: HYDROcodone-acetaminophen (NORCO/VICODIN) 5-325 MG tablet    Sig: Take 1 tablet by mouth 2 (two) times daily. For chronic pain.  To fill on or after: 06/04/2018, 07/03/2018, 08/02/2018    Dispense:  60 tablet    Refill:  0  . HYDROcodone-acetaminophen (NORCO/VICODIN) 5-325 MG tablet    Sig: Take 1 tablet by mouth 2 (two) times daily. For chronic pain.  To fill on or after: 06/04/2018, 07/03/2018, 08/02/2018    Dispense:  60 tablet    Refill:  0   Lab-work, procedure(s), and/or referral(s): Orders Placed This Encounter  Procedures  . Lumbar Epidural Injection  . Drug Screen 10 W/Conf, Serum   Provider-requested follow-up: Return for Procedure.  Time Note: Greater than 50% of the 25 minute(s)  of face-to-face time spent with Mr. Sharps, was spent in counseling/coordination of care regarding: Mr. Augello primary cause of pain, the treatment plan, treatment alternatives, the risks and possible complications of proposed treatment, medication side effects, the opioid analgesic risks and possible complications, the results, interpretation and significance of  his recent diagnostic interventional treatment(s), the appropriate use of his medications, realistic expectations, the goals of pain management (increased in functionality), the medication agreement and the patient's responsibilities when it comes to controlled substances.  Future Appointments  Date Time Provider [place]  06/09/2018  9:00 AM Mark Santa, MD [facility] None  07/10/2018  9:00 AM McGowan, Hunt Oris, PA-C [facility] None  09/17/2018  8:20 AM Caryn Section, Kirstie Peri, MD [facility] None    Primary Care Physician: Mark Sons, MD Location: [facility] Note by: Mark Terry, M.D Date: 05/29/2018; Time: 10:46 AM  Patient Instructions   You have been given 3 Rx for Hydrocodone with acetaminophen to last until 09/01/2018.  You will have serum drug screen drawn prior to next appt.     Moderate Conscious Sedation, Adult Sedation is the use of medicines to promote relaxation and relieve discomfort and anxiety. Moderate conscious sedation is a type of sedation. Under moderate conscious sedation, you are less alert than normal, but you are still able to respond to instructions, touch, or both. Moderate conscious sedation is used during short medical and dental procedures. It is milder than deep sedation, which is a type of sedation under which you cannot be easily woken up. It is also milder than general anesthesia, which is the use of medicines to make you unconscious. Moderate conscious sedation allows you to return to your regular activities sooner. Tell a health care provider about:  Any  allergies you have.  All medicines you are taking, including vitamins, herbs, eye drops, creams, and over-the-counter medicines.  Use of steroids (by mouth or creams).  Any problems you or family members have had with sedatives and anesthetic medicines.  Any blood disorders you have.  Any surgeries you have had.  Any medical conditions you have, such as sleep apnea.  Whether you are pregnant or may be pregnant.  Any use of cigarettes, alcohol, marijuana, or street drugs. What are the risks? Generally, this is a  safe procedure. However, problems may occur, including:  Getting too much medicine (oversedation).  Nausea.  Allergic reaction to medicines.  Trouble breathing. If this happens, a breathing tube may be used to help with breathing. It will be removed when you are awake and breathing on your own.  Heart trouble.  Lung trouble.  What happens before the procedure? Staying hydrated Follow instructions from your health care provider about hydration, which may include:  Up to 2 hours before the procedure - you may continue to drink clear liquids, such as water, clear fruit juice, black coffee, and plain tea.  Eating and drinking restrictions Follow instructions from your health care provider about eating and drinking, which may include:  8 hours before the procedure - stop eating heavy meals or foods such as meat, fried foods, or fatty foods.  6 hours before the procedure - stop eating light meals or foods, such as toast or cereal.  6 hours before the procedure - stop drinking milk or drinks that contain milk.  2 hours before the procedure - stop drinking clear liquids.  Medicine  Ask your health care provider about:  Changing or stopping your regular medicines. This is especially important if you are taking diabetes medicines or blood thinners.  Taking medicines such as aspirin and ibuprofen. These medicines can thin your blood. Do not take these medicines  before your procedure if your health care provider instructs you not to.  Tests and exams  You will have a physical exam.  You may have blood tests done to show: ? How well your kidneys and liver are working. ? How well your blood can clot. General instructions  Plan to have someone take you home from the hospital or clinic.  If you will be going home right after the procedure, plan to have someone with you for 24 hours. What happens during the procedure?  An IV tube will be inserted into one of your veins.  Medicine to help you relax (sedative) will be given through the IV tube.  The medical or dental procedure will be performed. What happens after the procedure?  Your blood pressure, heart rate, breathing rate, and blood oxygen level will be monitored often until the medicines you were given have worn off.  Do not drive for 24 hours. This information is not intended to replace advice given to you by your health care provider. Make sure you discuss any questions you have with your health care provider. Document Released: 03/20/2001 Document Revised: 11/29/2015 Document Reviewed: 10/15/2015 Elsevier Interactive Patient Education  2018 [place]  What are the risk, side effects and possible complications? Generally speaking, most procedures are safe.  However, with any procedure there are risks, side effects, and the possibility of complications.  The risks and complications are dependent upon the sites that are lesioned, or the type of nerve block to be performed.  The closer the procedure is to the spine, the more serious the risks are.  Great care is taken when placing the radio frequency needles, block needles or lesioning probes, but sometimes complications can occur. 1. Infection: Any time there is an injection through the skin, there is a risk of infection.  This is why sterile conditions are used for these blocks.  There are four possible  types of infection. 1. Localized skin infection. 2. Central Nervous System Infection-This can be in the form of Meningitis, which can be deadly. 3. Epidural Infections-This can be in the form of  an epidural abscess, which can cause pressure inside of the spine, causing compression of the spinal cord with subsequent paralysis. This would require an emergency surgery to decompress, and there are no guarantees that the patient would recover from the paralysis. 4. Discitis-This is an infection of the intervertebral discs.  It occurs in about 1% of discography procedures.  It is difficult to treat and it may lead to surgery.        2. Pain: the needles have to go through skin and soft tissues, will cause soreness.       3. Damage to internal structures:  The nerves to be lesioned may be near blood vessels or    other nerves which can be potentially damaged.       4. Bleeding: Bleeding is more common if the patient is taking blood thinners such as  aspirin, Coumadin, Ticiid, Plavix, etc., or if he/she have some genetic predisposition  such as hemophilia. Bleeding into the spinal canal can cause compression of the spinal  cord with subsequent paralysis.  This would require an emergency surgery to  decompress and there are no guarantees that the patient would recover from the  paralysis.       5. Pneumothorax:  Puncturing of a lung is a possibility, every time a needle is introduced in  the area of the chest or upper back.  Pneumothorax refers to free air around the  collapsed lung(s), inside of the thoracic cavity (chest cavity).  Another two possible  complications related to a similar event would include: Hemothorax and Chylothorax.   These are variations of the Pneumothorax, where instead of air around the collapsed  lung(s), you may have blood or chyle, respectively.       6. Spinal headaches: They may occur with any procedures in the area of the spine.       7. Persistent CSF (Cerebro-Spinal Fluid)  leakage: This is a rare problem, but may occur  with prolonged intrathecal or epidural catheters either due to the formation of a fistulous  track or a dural tear.       8. Nerve damage: By working so close to the spinal cord, there is always a possibility of  nerve damage, which could be as serious as a permanent spinal cord injury with  paralysis.       9. Death:  Although rare, severe deadly allergic reactions known as "Anaphylactic  reaction" can occur to any of the medications used.      10. Worsening of the symptoms:  We can always make thing worse.  What are the chances of something like this happening? Chances of any of this occuring are extremely low.  By statistics, you have more of a chance of getting killed in a motor vehicle accident: while driving to the hospital than any of the above occurring .  Nevertheless, you should be aware that they are possibilities.  In general, it is similar to taking a shower.  Everybody knows that you can slip, hit your head and get killed.  Does that mean that you should not shower again?  Nevertheless always keep in mind that statistics do not mean anything if you happen to be on the wrong side of them.  Even if a procedure has a 1 (one) in a 1,000,000 (million) chance of going wrong, it you happen to be that one..Also, keep in mind that by statistics, you have more of a chance of having something go wrong when taking medications.  Who should not have this procedure? If you are on a blood thinning medication (e.g. Coumadin, Plavix, see list of "Blood Thinners"), or if you have an active infection going on, you should not have the procedure.  If you are taking any blood thinners, please inform your physician.  How should I prepare for this procedure?  Do not eat or drink anything at least six hours prior to the procedure.  Bring a driver with you .  It cannot be a taxi.  Come accompanied by an adult that can drive you back, and that is strong enough to  help you if your legs get weak or numb from the local anesthetic.  Take all of your medicines the morning of the procedure with just enough water to swallow them.  If you have diabetes, make sure that you are scheduled to have your procedure done first thing in the morning, whenever possible.  If you have diabetes, take only half of your insulin dose and notify our nurse that you have done so as soon as you arrive at the clinic.  If you are diabetic, but only take blood sugar pills (oral hypoglycemic), then do not take them on the morning of your procedure.  You may take them after you have had the procedure.  Do not take aspirin or any aspirin-containing medications, at least eleven (11) days prior to the procedure.  They may prolong bleeding.  Wear loose fitting clothing that may be easy to take off and that you would not mind if it got stained with Betadine or blood.  Do not wear any jewelry or perfume  Remove any nail coloring.  It will interfere with some of our monitoring equipment.  NOTE: Remember that this is not meant to be interpreted as a complete list of all possible complications.  Unforeseen problems may occur.  BLOOD THINNERS The following drugs contain aspirin or other products, which can cause increased bleeding during surgery and should not be taken for 2 weeks prior to and 1 week after surgery.  If you should need take something for relief of minor pain, you may take acetaminophen which is found in Tylenol,m Datril, Anacin-3 and Panadol. It is not blood thinner. The products listed below are.  Do not take any of the products listed below in addition to any listed on your instruction sheet.  A.P.C or A.P.C with Codeine Codeine Phosphate Capsules #3 Ibuprofen Ridaura  ABC compound Congesprin Imuran rimadil  Advil Cope Indocin Robaxisal  Alka-Seltzer Effervescent Pain Reliever and Antacid Coricidin or Coricidin-D  Indomethacin Rufen  Alka-Seltzer plus Cold Medicine Cosprin  Ketoprofen S-A-C Tablets  Anacin Analgesic Tablets or Capsules Coumadin Korlgesic Salflex  Anacin Extra Strength Analgesic tablets or capsules CP-2 Tablets Lanoril Salicylate  Anaprox Cuprimine Capsules Levenox Salocol  Anexsia-D Dalteparin Magan Salsalate  Anodynos Darvon compound Magnesium Salicylate Sine-off  Ansaid Dasin Capsules Magsal Sodium Salicylate  Anturane Depen Capsules Marnal Soma  APF Arthritis pain formula Dewitt's Pills Measurin Stanback  Argesic Dia-Gesic Meclofenamic Sulfinpyrazone  Arthritis Bayer Timed Release Aspirin Diclofenac Meclomen Sulindac  Arthritis pain formula Anacin Dicumarol Medipren Supac  Analgesic (Safety coated) Arthralgen Diffunasal Mefanamic Suprofen  Arthritis Strength Bufferin Dihydrocodeine Mepro Compound Suprol  Arthropan liquid Dopirydamole Methcarbomol with Aspirin Synalgos  ASA tablets/Enseals Disalcid Micrainin Tagament  Ascriptin Doan's Midol Talwin  Ascriptin A/D Dolene Mobidin Tanderil  Ascriptin Extra Strength Dolobid Moblgesic Ticlid  Ascriptin with Codeine Doloprin or Doloprin with Codeine Momentum Tolectin  Asperbuf Duoprin Mono-gesic Trendar  Aspergum Duradyne  Motrin or Motrin IB Triminicin  Aspirin plain, buffered or enteric coated Durasal Myochrisine Trigesic  Aspirin Suppositories Easprin Nalfon Trillsate  Aspirin with Codeine Ecotrin Regular or Extra Strength Naprosyn Uracel  Atromid-S Efficin Naproxen Ursinus  Auranofin Capsules Elmiron Neocylate Vanquish  Axotal Emagrin Norgesic Verin  Azathioprine Empirin or Empirin with Codeine Normiflo Vitamin E  Azolid Emprazil Nuprin Voltaren  Bayer Aspirin plain, buffered or children's or timed BC Tablets or powders Encaprin Orgaran Warfarin Sodium  Buff-a-Comp Enoxaparin Orudis Zorpin  Buff-a-Comp with Codeine Equegesic Os-Cal-Gesic   Buffaprin Excedrin plain, buffered or Extra Strength Oxalid   Bufferin Arthritis Strength Feldene Oxphenbutazone   Bufferin plain or Extra Strength  Feldene Capsules Oxycodone with Aspirin   Bufferin with Codeine Fenoprofen Fenoprofen Pabalate or Pabalate-SF   Buffets II Flogesic Panagesic   Buffinol plain or Extra Strength Florinal or Florinal with Codeine Panwarfarin   Buf-Tabs Flurbiprofen Penicillamine   Butalbital Compound Four-way cold tablets Penicillin   Butazolidin Fragmin Pepto-Bismol   Carbenicillin Geminisyn Percodan   Carna Arthritis Reliever Geopen Persantine   Carprofen Gold's salt Persistin   Chloramphenicol Goody's Phenylbutazone   Chloromycetin Haltrain Piroxlcam   Clmetidine heparin Plaquenil   Cllnoril Hyco-pap Ponstel   Clofibrate Hydroxy chloroquine Propoxyphen         Before stopping any of these medications, be sure to consult the physician who ordered them.  Some, such as Coumadin (Warfarin) are ordered to prevent or treat serious conditions such as "deep thrombosis", "pumonary embolisms", and other heart problems.  The amount of time that you may need off of the medication may also vary with the medication and the reason for which you were taking it.  If you are taking any of these medications, please make sure you notify your pain physician before you undergo any procedures.         Epidural Steroid Injection Patient Information  Description: The epidural space surrounds the nerves as they exit the spinal cord.  In some patients, the nerves can be compressed and inflamed by a bulging disc or a tight spinal canal (spinal stenosis).  By injecting steroids into the epidural space, we can bring irritated nerves into direct contact with a potentially helpful medication.  These steroids act directly on the irritated nerves and can reduce swelling and inflammation which often leads to decreased pain.  Epidural steroids may be injected anywhere along the spine and from the neck to the low back depending upon the location of your pain.   After numbing the skin with local anesthetic (like Novocaine), a small needle  is passed into the epidural space slowly.  You may experience a sensation of pressure while this is being done.  The entire block usually last less than 10 minutes.  Conditions which may be treated by epidural steroids:   Low back and leg pain  Neck and arm pain  Spinal stenosis  Post-laminectomy syndrome  Herpes zoster (shingles) pain  Pain from compression fractures  Preparation for the injection:  1. Do not eat any solid food or dairy products within 8 hours of your appointment.  2. You may drink clear liquids up to 3 hours before appointment.  Clear liquids include water, black coffee, juice or soda.  No milk or cream please. 3. You may take your regular medication, including pain medications, with a sip of water before your appointment  Diabetics should hold regular insulin (if taken separately) and take 1/2 normal NPH dos the morning of the procedure.  Carry some sugar  containing items with you to your appointment. 4. A driver must accompany you and be prepared to drive you home after your procedure.  5. Bring all your current medications with your. 6. An IV may be inserted and sedation may be given at the discretion of the physician.   7. A blood pressure cuff, EKG and other monitors will often be applied during the procedure.  Some patients may need to have extra oxygen administered for a short period. 8. You will be asked to provide medical information, including your allergies, prior to the procedure.  We must know immediately if you are taking blood thinners (like Coumadin/Warfarin)  Or if you are allergic to IV iodine contrast (dye). We must know if you could possible be pregnant.  Possible side-effects:  Bleeding from needle site  Infection (rare, may require surgery)  Nerve injury (rare)  Numbness & tingling (temporary)  Difficulty urinating (rare, temporary)  Spinal headache ( a headache worse with upright posture)  Light -headedness (temporary)  Pain at  injection site (several days)  Decreased blood pressure (temporary)  Weakness in arm/leg (temporary)  Pressure sensation in back/neck (temporary)  Call if you experience:  Fever/chills associated with headache or increased back/neck pain.  Headache worsened by an upright position.  New onset weakness or numbness of an extremity below the injection site  Hives or difficulty breathing (go to the emergency room)  Inflammation or drainage at the infection site  Severe back/neck pain  Any new symptoms which are concerning to you  Please note:  Although the local anesthetic injected can often make your back or neck feel good for several hours after the injection, the pain will likely return.  It takes 3-7 days for steroids to work in the epidural space.  You may not notice any pain relief for at least that one week.  If effective, we will often do a series of three injections spaced 3-6 weeks apart to maximally decrease your pain.  After the initial series, we generally will wait several months before considering a repeat injection of the same type.  If you have any questions, please call [phone] [facility]

## 2018-05-29 NOTE — Patient Instructions (Addendum)
You have been given 3 Rx for Hydrocodone with acetaminophen to last until 09/01/2018.  You will have serum drug screen drawn prior to next appt.     Moderate Conscious Sedation, Adult Sedation is the use of medicines to promote relaxation and relieve discomfort and anxiety. Moderate conscious sedation is a type of sedation. Under moderate conscious sedation, you are less alert than normal, but you are still able to respond to instructions, touch, or both. Moderate conscious sedation is used during short medical and dental procedures. It is milder than deep sedation, which is a type of sedation under which you cannot be easily woken up. It is also milder than general anesthesia, which is the use of medicines to make you unconscious. Moderate conscious sedation allows you to return to your regular activities sooner. Tell a health care provider about:  Any allergies you have.  All medicines you are taking, including vitamins, herbs, eye drops, creams, and over-the-counter medicines.  Use of steroids (by mouth or creams).  Any problems you or family members have had with sedatives and anesthetic medicines.  Any blood disorders you have.  Any surgeries you have had.  Any medical conditions you have, such as sleep apnea.  Whether you are pregnant or may be pregnant.  Any use of cigarettes, alcohol, marijuana, or street drugs. What are the risks? Generally, this is a safe procedure. However, problems may occur, including:  Getting too much medicine (oversedation).  Nausea.  Allergic reaction to medicines.  Trouble breathing. If this happens, a breathing tube may be used to help with breathing. It will be removed when you are awake and breathing on your own.  Heart trouble.  Lung trouble.  What happens before the procedure? Staying hydrated Follow instructions from your health care provider about hydration, which may include:  Up to 2 hours before the procedure - you may  continue to drink clear liquids, such as water, clear fruit juice, black coffee, and plain tea.  Eating and drinking restrictions Follow instructions from your health care provider about eating and drinking, which may include:  8 hours before the procedure - stop eating heavy meals or foods such as meat, fried foods, or fatty foods.  6 hours before the procedure - stop eating light meals or foods, such as toast or cereal.  6 hours before the procedure - stop drinking milk or drinks that contain milk.  2 hours before the procedure - stop drinking clear liquids.  Medicine  Ask your health care provider about:  Changing or stopping your regular medicines. This is especially important if you are taking diabetes medicines or blood thinners.  Taking medicines such as aspirin and ibuprofen. These medicines can thin your blood. Do not take these medicines before your procedure if your health care provider instructs you not to.  Tests and exams  You will have a physical exam.  You may have blood tests done to show: ? How well your kidneys and liver are working. ? How well your blood can clot. General instructions  Plan to have someone take you home from the hospital or clinic.  If you will be going home right after the procedure, plan to have someone with you for 24 hours. What happens during the procedure?  An IV tube will be inserted into one of your veins.  Medicine to help you relax (sedative) will be given through the IV tube.  The medical or dental procedure will be performed. What happens after the procedure?  Your  blood pressure, heart rate, breathing rate, and blood oxygen level will be monitored often until the medicines you were given have worn off.  Do not drive for 24 hours. This information is not intended to replace advice given to you by your health care provider. Make sure you discuss any questions you have with your health care provider. Document Released:  03/20/2001 Document Revised: 11/29/2015 Document Reviewed: 10/15/2015 Elsevier Interactive Patient Education  2018 [place]  What are the risk, side effects and possible complications? Generally speaking, most procedures are safe.  However, with any procedure there are risks, side effects, and the possibility of complications.  The risks and complications are dependent upon the sites that are lesioned, or the type of nerve block to be performed.  The closer the procedure is to the spine, the more serious the risks are.  Great care is taken when placing the radio frequency needles, block needles or lesioning probes, but sometimes complications can occur. 1. Infection: Any time there is an injection through the skin, there is a risk of infection.  This is why sterile conditions are used for these blocks.  There are four possible types of infection. 1. Localized skin infection. 2. Central Nervous System Infection-This can be in the form of Meningitis, which can be deadly. 3. Epidural Infections-This can be in the form of an epidural abscess, which can cause pressure inside of the spine, causing compression of the spinal cord with subsequent paralysis. This would require an emergency surgery to decompress, and there are no guarantees that the patient would recover from the paralysis. 4. Discitis-This is an infection of the intervertebral discs.  It occurs in about 1% of discography procedures.  It is difficult to treat and it may lead to surgery.        2. Pain: the needles have to go through skin and soft tissues, will cause soreness.       3. Damage to internal structures:  The nerves to be lesioned may be near blood vessels or    other nerves which can be potentially damaged.       4. Bleeding: Bleeding is more common if the patient is taking blood thinners such as  aspirin, Coumadin, Ticiid, Plavix, etc., or if he/she have some genetic predisposition  such as  hemophilia. Bleeding into the spinal canal can cause compression of the spinal  cord with subsequent paralysis.  This would require an emergency surgery to  decompress and there are no guarantees that the patient would recover from the  paralysis.       5. Pneumothorax:  Puncturing of a lung is a possibility, every time a needle is introduced in  the area of the chest or upper back.  Pneumothorax refers to free air around the  collapsed lung(s), inside of the thoracic cavity (chest cavity).  Another two possible  complications related to a similar event would include: Hemothorax and Chylothorax.   These are variations of the Pneumothorax, where instead of air around the collapsed  lung(s), you may have blood or chyle, respectively.       6. Spinal headaches: They may occur with any procedures in the area of the spine.       7. Persistent CSF (Cerebro-Spinal Fluid) leakage: This is a rare problem, but may occur  with prolonged intrathecal or epidural catheters either due to the formation of a fistulous  track or a dural tear.       8. Nerve  damage: By working so close to the spinal cord, there is always a possibility of  nerve damage, which could be as serious as a permanent spinal cord injury with  paralysis.       9. Death:  Although rare, severe deadly allergic reactions known as "Anaphylactic  reaction" can occur to any of the medications used.      10. Worsening of the symptoms:  We can always make thing worse.  What are the chances of something like this happening? Chances of any of this occuring are extremely low.  By statistics, you have more of a chance of getting killed in a motor vehicle accident: while driving to the hospital than any of the above occurring .  Nevertheless, you should be aware that they are possibilities.  In general, it is similar to taking a shower.  Everybody knows that you can slip, hit your head and get killed.  Does that mean that you should not shower again?  Nevertheless  always keep in mind that statistics do not mean anything if you happen to be on the wrong side of them.  Even if a procedure has a 1 (one) in a 1,000,000 (million) chance of going wrong, it you happen to be that one..Also, keep in mind that by statistics, you have more of a chance of having something go wrong when taking medications.  Who should not have this procedure? If you are on a blood thinning medication (e.g. Coumadin, Plavix, see list of "Blood Thinners"), or if you have an active infection going on, you should not have the procedure.  If you are taking any blood thinners, please inform your physician.  How should I prepare for this procedure?  Do not eat or drink anything at least six hours prior to the procedure.  Bring a driver with you .  It cannot be a taxi.  Come accompanied by an adult that can drive you back, and that is strong enough to help you if your legs get weak or numb from the local anesthetic.  Take all of your medicines the morning of the procedure with just enough water to swallow them.  If you have diabetes, make sure that you are scheduled to have your procedure done first thing in the morning, whenever possible.  If you have diabetes, take only half of your insulin dose and notify our nurse that you have done so as soon as you arrive at the clinic.  If you are diabetic, but only take blood sugar pills (oral hypoglycemic), then do not take them on the morning of your procedure.  You may take them after you have had the procedure.  Do not take aspirin or any aspirin-containing medications, at least eleven (11) days prior to the procedure.  They may prolong bleeding.  Wear loose fitting clothing that may be easy to take off and that you would not mind if it got stained with Betadine or blood.  Do not wear any jewelry or perfume  Remove any nail coloring.  It will interfere with some of our monitoring equipment.  NOTE: Remember that this is not meant to be  interpreted as a complete list of all possible complications.  Unforeseen problems may occur.  BLOOD THINNERS The following drugs contain aspirin or other products, which can cause increased bleeding during surgery and should not be taken for 2 weeks prior to and 1 week after surgery.  If you should need take something for relief of minor pain, you  may take acetaminophen which is found in Tylenol,m Datril, Anacin-3 and Panadol. It is not blood thinner. The products listed below are.  Do not take any of the products listed below in addition to any listed on your instruction sheet.  A.P.C or A.P.C with Codeine Codeine Phosphate Capsules #3 Ibuprofen Ridaura  ABC compound Congesprin Imuran rimadil  Advil Cope Indocin Robaxisal  Alka-Seltzer Effervescent Pain Reliever and Antacid Coricidin or Coricidin-D  Indomethacin Rufen  Alka-Seltzer plus Cold Medicine Cosprin Ketoprofen S-A-C Tablets  Anacin Analgesic Tablets or Capsules Coumadin Korlgesic Salflex  Anacin Extra Strength Analgesic tablets or capsules CP-2 Tablets Lanoril Salicylate  Anaprox Cuprimine Capsules Levenox Salocol  Anexsia-D Dalteparin Magan Salsalate  Anodynos Darvon compound Magnesium Salicylate Sine-off  Ansaid Dasin Capsules Magsal Sodium Salicylate  Anturane Depen Capsules Marnal Soma  APF Arthritis pain formula Dewitt's Pills Measurin Stanback  Argesic Dia-Gesic Meclofenamic Sulfinpyrazone  Arthritis Bayer Timed Release Aspirin Diclofenac Meclomen Sulindac  Arthritis pain formula Anacin Dicumarol Medipren Supac  Analgesic (Safety coated) Arthralgen Diffunasal Mefanamic Suprofen  Arthritis Strength Bufferin Dihydrocodeine Mepro Compound Suprol  Arthropan liquid Dopirydamole Methcarbomol with Aspirin Synalgos  ASA tablets/Enseals Disalcid Micrainin Tagament  Ascriptin Doan's Midol Talwin  Ascriptin A/D Dolene Mobidin Tanderil  Ascriptin Extra Strength Dolobid Moblgesic Ticlid  Ascriptin with Codeine Doloprin or Doloprin  with Codeine Momentum Tolectin  Asperbuf Duoprin Mono-gesic Trendar  Aspergum Duradyne Motrin or Motrin IB Triminicin  Aspirin plain, buffered or enteric coated Durasal Myochrisine Trigesic  Aspirin Suppositories Easprin Nalfon Trillsate  Aspirin with Codeine Ecotrin Regular or Extra Strength Naprosyn Uracel  Atromid-S Efficin Naproxen Ursinus  Auranofin Capsules Elmiron Neocylate Vanquish  Axotal Emagrin Norgesic Verin  Azathioprine Empirin or Empirin with Codeine Normiflo Vitamin E  Azolid Emprazil Nuprin Voltaren  Bayer Aspirin plain, buffered or children's or timed BC Tablets or powders Encaprin Orgaran Warfarin Sodium  Buff-a-Comp Enoxaparin Orudis Zorpin  Buff-a-Comp with Codeine Equegesic Os-Cal-Gesic   Buffaprin Excedrin plain, buffered or Extra Strength Oxalid   Bufferin Arthritis Strength Feldene Oxphenbutazone   Bufferin plain or Extra Strength Feldene Capsules Oxycodone with Aspirin   Bufferin with Codeine Fenoprofen Fenoprofen Pabalate or Pabalate-SF   Buffets II Flogesic Panagesic   Buffinol plain or Extra Strength Florinal or Florinal with Codeine Panwarfarin   Buf-Tabs Flurbiprofen Penicillamine   Butalbital Compound Four-way cold tablets Penicillin   Butazolidin Fragmin Pepto-Bismol   Carbenicillin Geminisyn Percodan   Carna Arthritis Reliever Geopen Persantine   Carprofen Gold's salt Persistin   Chloramphenicol Goody's Phenylbutazone   Chloromycetin Haltrain Piroxlcam   Clmetidine heparin Plaquenil   Cllnoril Hyco-pap Ponstel   Clofibrate Hydroxy chloroquine Propoxyphen         Before stopping any of these medications, be sure to consult the physician who ordered them.  Some, such as Coumadin (Warfarin) are ordered to prevent or treat serious conditions such as "deep thrombosis", "pumonary embolisms", and other heart problems.  The amount of time that you may need off of the medication may also vary with the medication and the reason for which you were taking it.   If you are taking any of these medications, please make sure you notify your pain physician before you undergo any procedures.         Epidural Steroid Injection Patient Information  Description: The epidural space surrounds the nerves as they exit the spinal cord.  In some patients, the nerves can be compressed and inflamed by a bulging disc or a tight spinal canal (spinal stenosis).  By injecting steroids into  the epidural space, we can bring irritated nerves into direct contact with a potentially helpful medication.  These steroids act directly on the irritated nerves and can reduce swelling and inflammation which often leads to decreased pain.  Epidural steroids may be injected anywhere along the spine and from the neck to the low back depending upon the location of your pain.   After numbing the skin with local anesthetic (like Novocaine), a small needle is passed into the epidural space slowly.  You may experience a sensation of pressure while this is being done.  The entire block usually last less than 10 minutes.  Conditions which may be treated by epidural steroids:   Low back and leg pain  Neck and arm pain  Spinal stenosis  Post-laminectomy syndrome  Herpes zoster (shingles) pain  Pain from compression fractures  Preparation for the injection:  1. Do not eat any solid food or dairy products within 8 hours of your appointment.  2. You may drink clear liquids up to 3 hours before appointment.  Clear liquids include water, black coffee, juice or soda.  No milk or cream please. 3. You may take your regular medication, including pain medications, with a sip of water before your appointment  Diabetics should hold regular insulin (if taken separately) and take 1/2 normal NPH dos the morning of the procedure.  Carry some sugar containing items with you to your appointment. 4. A driver must accompany you and be prepared to drive you home after your procedure.  5. Bring all your  current medications with your. 6. An IV may be inserted and sedation may be given at the discretion of the physician.   7. A blood pressure cuff, EKG and other monitors will often be applied during the procedure.  Some patients may need to have extra oxygen administered for a short period. 8. You will be asked to provide medical information, including your allergies, prior to the procedure.  We must know immediately if you are taking blood thinners (like Coumadin/Warfarin)  Or if you are allergic to IV iodine contrast (dye). We must know if you could possible be pregnant.  Possible side-effects:  Bleeding from needle site  Infection (rare, may require surgery)  Nerve injury (rare)  Numbness & tingling (temporary)  Difficulty urinating (rare, temporary)  Spinal headache ( a headache worse with upright posture)  Light -headedness (temporary)  Pain at injection site (several days)  Decreased blood pressure (temporary)  Weakness in arm/leg (temporary)  Pressure sensation in back/neck (temporary)  Call if you experience:  Fever/chills associated with headache or increased back/neck pain.  Headache worsened by an upright position.  New onset weakness or numbness of an extremity below the injection site  Hives or difficulty breathing (go to the emergency room)  Inflammation or drainage at the infection site  Severe back/neck pain  Any new symptoms which are concerning to you  Please note:  Although the local anesthetic injected can often make your back or neck feel good for several hours after the injection, the pain will likely return.  It takes 3-7 days for steroids to work in the epidural space.  You may not notice any pain relief for at least that one week.  If effective, we will often do a series of three injections spaced 3-6 weeks apart to maximally decrease your pain.  After the initial series, we generally will wait several months before considering a repeat  injection of the same type.  If you have any  questions, please call [phone] [facility]

## 2018-05-29 NOTE — Progress Notes (Signed)
Nursing Pain Medication Assessment:  Safety precautions to be maintained throughout the outpatient stay will include: orient to surroundings, keep bed in low position, maintain call bell within reach at all times, provide assistance with transfer out of bed and ambulation.  Medication Inspection Compliance: Mark Terry did not comply with our request to bring his pills to be counted. He was reminded that bringing the medication bottles, even when empty, is a requirement.  Medication: None brought in. Pill/Patch Count: None available to be counted. Bottle Appearance: No container available. Did not bring bottle(s) to appointment. Filled Date: N/A Last Medication intake:  Today 

## 2018-06-09 ENCOUNTER — Ambulatory Visit (HOSPITAL_BASED_OUTPATIENT_CLINIC_OR_DEPARTMENT_OTHER): Payer: Medicare Other | Admitting: Student in an Organized Health Care Education/Training Program

## 2018-06-09 ENCOUNTER — Encounter: Payer: Self-pay | Admitting: Student in an Organized Health Care Education/Training Program

## 2018-06-09 ENCOUNTER — Ambulatory Visit
Admission: RE | Admit: 2018-06-09 | Discharge: 2018-06-09 | Disposition: A | Discharge status: Home / Self Care | Payer: Medicare Other | Source: Ambulatory Visit | Attending: Student in an Organized Health Care Education/Training Program | Admitting: Student in an Organized Health Care Education/Training Program

## 2018-06-09 VITALS — BP 152/86 | HR 72 | Temp 97.8°F | Resp 18 | Ht 61.0 in | Wt 228.0 lb

## 2018-06-09 DIAGNOSIS — Z981 Arthrodesis status: Secondary | ICD-10-CM | POA: Diagnosis not present

## 2018-06-09 DIAGNOSIS — Z9889 Other specified postprocedural states: Secondary | ICD-10-CM | POA: Diagnosis not present

## 2018-06-09 DIAGNOSIS — Z7982 Long term (current) use of aspirin: Secondary | ICD-10-CM | POA: Insufficient documentation

## 2018-06-09 DIAGNOSIS — Z885 Allergy status to narcotic agent status: Secondary | ICD-10-CM | POA: Insufficient documentation

## 2018-06-09 DIAGNOSIS — Z791 Long term (current) use of non-steroidal anti-inflammatories (NSAID): Secondary | ICD-10-CM | POA: Diagnosis not present

## 2018-06-09 DIAGNOSIS — Z9049 Acquired absence of other specified parts of digestive tract: Secondary | ICD-10-CM | POA: Diagnosis not present

## 2018-06-09 DIAGNOSIS — Z79899 Other long term (current) drug therapy: Secondary | ICD-10-CM | POA: Insufficient documentation

## 2018-06-09 DIAGNOSIS — Z96653 Presence of artificial knee joint, bilateral: Secondary | ICD-10-CM | POA: Diagnosis not present

## 2018-06-09 DIAGNOSIS — Z96641 Presence of right artificial hip joint: Secondary | ICD-10-CM | POA: Diagnosis not present

## 2018-06-09 DIAGNOSIS — Z951 Presence of aortocoronary bypass graft: Secondary | ICD-10-CM | POA: Insufficient documentation

## 2018-06-09 DIAGNOSIS — M5416 Radiculopathy, lumbar region: Secondary | ICD-10-CM | POA: Diagnosis not present

## 2018-06-09 DIAGNOSIS — Z7984 Long term (current) use of oral hypoglycemic drugs: Secondary | ICD-10-CM | POA: Diagnosis not present

## 2018-06-09 DIAGNOSIS — M545 Low back pain: Secondary | ICD-10-CM | POA: Diagnosis present

## 2018-06-09 LAB — OPIATES,MS,WB/SP RFX
6-Acetylmorphine: NEGATIVE
Codeine: NEGATIVE ng/mL
Dihydrocodeine: NEGATIVE ng/mL
Hydrocodone: 5.6 ng/mL
Hydromorphone: NEGATIVE ng/mL
Morphine: NEGATIVE ng/mL
Opiate Confirmation: POSITIVE

## 2018-06-09 LAB — DRUG SCREEN 10 W/CONF, SERUM
Amphetamines, IA: NEGATIVE ng/mL
BENZODIAZEPINES, IA: NEGATIVE ng/mL
Barbiturates, IA: NEGATIVE ug/mL
Cocaine & Metabolite, IA: NEGATIVE ng/mL
Methadone, IA: NEGATIVE ng/mL
OPIATES, IA: POSITIVE ng/mL — AB
Oxycodones, IA: NEGATIVE ng/mL
Phencyclidine, IA: NEGATIVE ng/mL
Propoxyphene, IA: NEGATIVE ng/mL
THC(MARIJUANA) METABOLITE, IA: NEGATIVE ng/mL

## 2018-06-09 LAB — OXYCODONES,MS,WB/SP RFX
Oxycocone: NEGATIVE ng/mL
Oxycodones Confirmation: NEGATIVE
Oxymorphone: NEGATIVE ng/mL

## 2018-06-09 MED ORDER — DEXAMETHASONE SODIUM PHOSPHATE 10 MG/ML IJ SOLN
INTRAMUSCULAR | Status: AC
  Filled 2018-06-09: qty 1

## 2018-06-09 MED ORDER — SODIUM CHLORIDE 0.9% FLUSH
2.0000 mL | Freq: Once | INTRAVENOUS | Status: AC
  Administered 2018-06-09: 2 mL

## 2018-06-09 MED ORDER — IOPAMIDOL (ISOVUE-M 200) INJECTION 41%
10.0000 mL | Freq: Once | INTRAMUSCULAR | Status: AC
  Administered 2018-06-09: 10 mL via EPIDURAL

## 2018-06-09 MED ORDER — SODIUM CHLORIDE (PF) 0.9 % IJ SOLN
INTRAMUSCULAR | Status: AC
  Filled 2018-06-09: qty 10

## 2018-06-09 MED ORDER — IOPAMIDOL (ISOVUE-M 200) INJECTION 41%
INTRAMUSCULAR | Status: AC
  Filled 2018-06-09: qty 10

## 2018-06-09 MED ORDER — LIDOCAINE HCL 2 % IJ SOLN
10.0000 mL | Freq: Once | INTRAMUSCULAR | Status: AC
  Administered 2018-06-09: 400 mg

## 2018-06-09 MED ORDER — ROPIVACAINE HCL 2 MG/ML IJ SOLN
INTRAMUSCULAR | Status: AC
  Filled 2018-06-09: qty 10

## 2018-06-09 MED ORDER — DEXAMETHASONE SODIUM PHOSPHATE 10 MG/ML IJ SOLN
10.0000 mg | Freq: Once | INTRAMUSCULAR | Status: AC
  Administered 2018-06-09: 10 mg

## 2018-06-09 MED ORDER — LIDOCAINE HCL 2 % IJ SOLN
INTRAMUSCULAR | Status: AC
  Filled 2018-06-09: qty 20

## 2018-06-09 MED ORDER — ROPIVACAINE HCL 2 MG/ML IJ SOLN
2.0000 mL | Freq: Once | INTRAMUSCULAR | Status: AC
  Administered 2018-06-09: 2 mL via EPIDURAL

## 2018-06-09 NOTE — Progress Notes (Signed)
Patient's Name: Mark Terry.  MRN: [id number]  Referring Provider: Birdie Sons, MD  DOB: [birth year]  PCP: Birdie Sons, MD  DOS: 06/09/2018  Note by: Gillis Santa, MD  Service setting: Ambulatory outpatient  Specialty: Interventional Pain Management  Patient type: Established  Location: [facility] (AMB) Pain Management Facility  Visit type: Interventional Procedure   Primary Reason for Visit: Interventional Pain Management Treatment. CC: low back pain with radiation to L>R leg  Procedure:          Anesthesia, Analgesia, Anxiolysis:  Type: Diagnostic Inter-Laminar Epidural Steroid Injection  #2  Region: Lumbar Level: L5-S1 Level. Laterality: Left-Sided         Type: Local Anesthesia Indication(s): Analgesia         Route: Infiltration ([place]/IM) IV Access: Declined Sedation: Declined  Local Anesthetic: Lidocaine 2%  Position: Prone with head of the table was raised to facilitate breathing.   Indications: 1. Lumbar radiculopathy    Pain Score: Pre-procedure: 5 /10 Post-procedure: 0-No pain/10  Pre-op Assessment:  Mark Terry is a 83 y.o. (year old), male patient, seen today for interventional treatment. He  has a past surgical history that includes Cholecystectomy (1970); Replacement total knee bilateral ('86 and 2000); Total hip arthroplasty (Right, 2012); Neck surgery; Coronary artery bypass graft (2000); Colonoscopy; bilateral cataract surgery; Reverse shoulder arthroplasty (05/30/2012); Back surgery; Ankle Fusion (Right, 11/04/2015); prostate laser surgery; Coronary angioplasty; Joint replacement; and Esophagogastroduodenoscopy (egd) with propofol (N/A, 11/18/2017). Mark Terry has a current medication list which includes the following prescription(s): aspirin, accu-chek guide, doxazosin, fluticasone, furosemide, gabapentin, glimepiride, glucose blood, hydrocodone-acetaminophen, ibuprofen, jardiance, loratadine, meloxicam, metformin, metoprolol tartrate, mirabegron er, mirtazapine,  multivitamin with minerals, omeprazole, oxybutynin, potassium chloride sa, simvastatin, and trazodone. His primarily concern today is the Back Pain (lumbar bilateral )  Initial Vital Signs:  Pulse/HCG Rate: 72ECG Heart Rate: 96 Temp: 97.8 F (36.6 C) Resp: 16 BP: 126/69 SpO2: 97 %  BMI: Estimated body mass index is 43.08 kg/m as calculated from the following:   Height as of this encounter: 5\' 1"  (1.549 m).   Weight as of this encounter: 228 lb (103.4 kg).  Risk Assessment: Allergies: Reviewed. He is allergic to propoxyphene; codeine sulfate; darvon [propoxyphene hcl]; demerol [meperidine]; and oxycodone.  Allergy Precautions: None required Coagulopathies: Reviewed. None identified.  Blood-thinner therapy: None at this time Active Infection(s): Reviewed. None identified. Mark Terry is afebrile  Site Confirmation: Mark Terry was asked to confirm the procedure and laterality before marking the site Procedure checklist: Completed Consent: Before the procedure and under the influence of no sedative(s), amnesic(s), or anxiolytics, the patient was informed of the treatment options, risks and possible complications. To fulfill our ethical and legal obligations, as recommended by the American Medical Association's Code of Ethics, I have informed the patient of my clinical impression; the nature and purpose of the treatment or procedure; the risks, benefits, and possible complications of the intervention; the alternatives, including doing nothing; the risk(s) and benefit(s) of the alternative treatment(s) or procedure(s); and the risk(s) and benefit(s) of doing nothing. The patient was provided information about the general risks and possible complications associated with the procedure. These may include, but are not limited to: failure to achieve desired goals, infection, bleeding, organ or nerve damage, allergic reactions, paralysis, and death. In addition, the patient was informed of those risks  and complications associated to Spine-related procedures, such as failure to decrease pain; infection (i.e.: Meningitis, epidural or intraspinal abscess); bleeding (i.e.: epidural hematoma, subarachnoid hemorrhage, or any  other type of intraspinal or peri-dural bleeding); organ or nerve damage (i.e.: Any type of peripheral nerve, nerve root, or spinal cord injury) with subsequent damage to sensory, motor, and/or autonomic systems, resulting in permanent pain, numbness, and/or weakness of one or several areas of the body; allergic reactions; (i.e.: anaphylactic reaction); and/or death. Furthermore, the patient was informed of those risks and complications associated with the medications. These include, but are not limited to: allergic reactions (i.e.: anaphylactic or anaphylactoid reaction(s)); adrenal axis suppression; blood sugar elevation that in diabetics may result in ketoacidosis or comma; water retention that in patients with history of congestive heart failure may result in shortness of breath, pulmonary edema, and decompensation with resultant heart failure; weight gain; swelling or edema; medication-induced neural toxicity; particulate matter embolism and blood vessel occlusion with resultant organ, and/or nervous system infarction; and/or aseptic necrosis of one or more joints. Finally, the patient was informed that Medicine is not an exact science; therefore, there is also the possibility of unforeseen or unpredictable risks and/or possible complications that may result in a catastrophic outcome. The patient indicated having understood very clearly. We have given the patient no guarantees and we have made no promises. Enough time was given to the patient to ask questions, all of which were answered to the patient's satisfaction. Mark Terry has indicated that he wanted to continue with the procedure. Attestation: I, the ordering provider, attest that I have discussed with the patient the benefits,  risks, side-effects, alternatives, likelihood of achieving goals, and potential problems during recovery for the procedure that I have provided informed consent. Date  Time: 06/09/2018  8:37 AM  Pre-Procedure Preparation:  Monitoring: As per clinic protocol. Respiration, ETCO2, SpO2, BP, heart rate and rhythm monitor placed and checked for adequate function Safety Precautions: Patient was assessed for positional comfort and pressure points before starting the procedure. Time-out: I initiated and conducted the "Time-out" before starting the procedure, as per protocol. The patient was asked to participate by confirming the accuracy of the "Time Out" information. Verification of the correct person, site, and procedure were performed and confirmed by me, the nursing staff, and the patient. "Time-out" conducted as per Joint Commission's Universal Protocol (UP.01.01.01). Time: 0905  Description of Procedure:          Target Area: The interlaminar space, initially targeting the lower laminar border of the superior vertebral body. Approach: Paramedial approach. Area Prepped: Entire Posterior Lumbar Region Prepping solution: ChloraPrep (2% chlorhexidine gluconate and 70% isopropyl alcohol) Safety Precautions: Aspiration looking for blood return was conducted prior to all injections. At no point did we inject any substances, as a needle was being advanced. No attempts were made at seeking any paresthesias. Safe injection practices and needle disposal techniques used. Medications properly checked for expiration dates. SDV (single dose vial) medications used. Description of the Procedure: Protocol guidelines were followed. The procedure needle was introduced through the skin, ipsilateral to the reported pain, and advanced to the target area. Bone was contacted and the needle walked caudad, until the lamina was cleared. The epidural space was identified using "loss-of-resistance technique" with 2-3 ml of PF-NaCl  (0.9% NSS), in a 5cc LOR glass syringe.  Vitals:   06/09/18 0845 06/09/18 0859 06/09/18 0905 06/09/18 0915  BP: 126/69 119/64 132/83 (!) 152/86  Pulse: 72     Resp: 16 19 16 18   Temp: 97.8 F (36.6 C)     TempSrc: Oral     SpO2: 97% 98% 96% 97%  Weight: 228 lb (  103.4 kg)     Height: 5\' 1"  (1.549 m)       Start Time: 0905 hrs. End Time: 0912 hrs.  Materials:  Needle(s) Type: Epidural needle Gauge: 17G Length: 5-in Medication(s): Please see orders for medications and dosing details. 8 cc solution made of 5 cc of preservative-free saline,2 cc of 0.2% ropivacaine, 1 cc of Decadron 10 mg/cc Imaging Guidance (Spinal):          Type of Imaging Technique: Fluoroscopy Guidance (Spinal) Indication(s): Assistance in needle guidance and placement for procedures requiring needle placement in or near specific anatomical locations not easily accessible without such assistance. Exposure Time: Please see nurses notes. Contrast: Before injecting any contrast, we confirmed that the patient did not have an allergy to iodine, shellfish, or radiological contrast. Once satisfactory needle placement was completed at the desired level, radiological contrast was injected. Contrast injected under live fluoroscopy. No contrast complications. See chart for type and volume of contrast used. Fluoroscopic Guidance: I was personally present during the use of fluoroscopy. "Tunnel Vision Technique" used to obtain the best possible view of the target area. Parallax error corrected before commencing the procedure. "Direction-depth-direction" technique used to introduce the needle under continuous pulsed fluoroscopy. Once target was reached, antero-posterior, oblique, and lateral fluoroscopic projection used confirm needle placement in all planes. Images permanently stored in EMR. Interpretation: I personally interpreted the imaging intraoperatively. Adequate needle placement confirmed in multiple planes. Appropriate spread  of contrast into desired area was observed. No evidence of afferent or efferent intravascular uptake. No intrathecal or subarachnoid spread observed. Permanent images saved into the patient's record.  Antibiotic Prophylaxis:   Anti-infectives (From admission, onward)   None     Indication(s): None identified  Post-operative Assessment:  Post-procedure Vital Signs:  Pulse/HCG Rate: 7286 Temp: 97.8 F (36.6 C) Resp: 18 BP: (!) 152/86 SpO2: 97 %  EBL: None  Complications: No immediate post-treatment complications observed by team, or reported by patient.  Note: The patient tolerated the entire procedure well. A repeat set of vitals were taken after the procedure and the patient was kept under observation following institutional policy, for this type of procedure. Post-procedural neurological assessment was performed, showing return to baseline, prior to discharge. The patient was provided with post-procedure discharge instructions, including a section on how to identify potential problems. Should any problems arise concerning this procedure, the patient was given instructions to immediately contact us, at any time, without hesitation. In any case, we plan to contact the patient by telephone for a follow-up status report regarding this interventional procedure.  Comments:  No additional relevant information.  Plan of Care    Imaging Orders     DG C-Arm 1-60 Min-No Report Procedure Orders    No procedure(s) ordered today    Patient also states that he is out of his hydrocodone.  He takes this twice daily.  We will obtain a urine drug screen and also prescribe hydrocodone at his previous dose of 5 mill grams twice daily as needed.  Medications ordered for procedure: Meds ordered this encounter  Medications  . iopamidol (ISOVUE-M) 41 % intrathecal injection 10 mL  . ropivacaine (PF) 2 mg/mL (0.2%) (NAROPIN) injection 2 mL  . sodium chloride flush (NS) 0.9 % injection 2 mL  .  lidocaine (XYLOCAINE) 2 % (with pres) injection 200 mg  . dexamethasone (DECADRON) injection 10 mg   Medications administered: We administered iopamidol, ropivacaine (PF) 2 mg/mL (0.2%), sodium chloride flush, lidocaine, and dexamethasone.  See the medical  record for exact dosing, route, and time of administration.  Disposition: Discharge home  Discharge Date & Time: 06/09/2018; 0918 hrs.   Physician-requested Follow-up: Return in about 5 weeks (around 07/14/2018) for Post Procedure Evaluation.  Future Appointments  Date Time Provider [place]  07/10/2018  9:00 AM McGowan, Hunt Oris, PA-C [facility] None  07/15/2018  8:30 AM Gillis Santa, MD [facility] None  08/12/2018  8:30 AM Gillis Santa, MD [facility] None  09/17/2018  8:20 AM Birdie Sons, MD [facility] None   Primary Care Physician: Birdie Sons, MD Location: [facility] Note by: Gillis Santa, MD Date: 06/09/2018; Time: 9:36 AM  Disclaimer:  Medicine is not an exact science. The only guarantee in medicine is that nothing is guaranteed. It is important to note that the decision to proceed with this intervention was based on the information collected from the patient. The Data and conclusions were drawn from the patient's questionnaire, the interview, and the physical examination. Because the information was provided in large part by the patient, it cannot be guaranteed that it has not been purposely or unconsciously manipulated. Every effort has been made to obtain as much relevant data as possible for this evaluation. It is important to note that the conclusions that lead to this procedure are derived in large part from the available data. Always take into account that the treatment will also be dependent on availability of resources and existing treatment guidelines, considered by other Pain Management Practitioners as being common knowledge and practice, at the time of the intervention. For Medico-Legal  purposes, it is also important to point out that variation in procedural techniques and pharmacological choices are the acceptable norm. The indications, contraindications, technique, and results of the above procedure should only be interpreted and judged by a Board-Certified Interventional Pain Specialist with extensive familiarity and expertise in the same exact procedure and technique.

## 2018-06-09 NOTE — Patient Instructions (Signed)
____________________________________________________________________________________________  Post-Procedure Discharge Instructions  Instructions:  Apply ice: Fill a plastic sandwich bag with crushed ice. Cover it with a small towel and apply to injection site. Apply for 15 minutes then remove x 15 minutes. Repeat sequence on day of procedure, until you go to bed. The purpose is to minimize swelling and discomfort after procedure.  Apply heat: Apply heat to procedure site starting the day following the procedure. The purpose is to treat any soreness and discomfort from the procedure.  Food intake: Start with clear liquids (like water) and advance to regular food, as tolerated.   Physical activities: Keep activities to a minimum for the first 8 hours after the procedure.   Driving: If you have received any sedation, you are not allowed to drive for 24 hours after your procedure.  Blood thinner: Restart your blood thinner 6 hours after your procedure. (Only for those taking blood thinners)  Insulin: As soon as you can eat, you may resume your normal dosing schedule. (Only for those taking insulin)  Infection prevention: Keep procedure site clean and dry.  Post-procedure Pain Diary: Extremely important that this be done correctly and accurately. Recorded information will be used to determine the next step in treatment.  Pain evaluated is that of treated area only. Do not include pain from an untreated area.  Complete every hour, on the hour, for the initial 8 hours. Set an alarm to help you do this part accurately.  Do not go to sleep and have it completed later. It will not be accurate.  Follow-up appointment: Keep your follow-up appointment after the procedure. Usually 2 weeks for most procedures. (6 weeks in the case of radiofrequency.) Bring you pain diary.   Expect:  From numbing medicine (AKA: Local Anesthetics): Numbness or decrease in pain.  Onset: Full effect within 15  minutes of injected.  Duration: It will depend on the type of local anesthetic used. On the average, 1 to 8 hours.   From steroids: Decrease in swelling or inflammation. Once inflammation is improved, relief of the pain will follow.  Onset of benefits: Depends on the amount of swelling present. The more swelling, the longer it will take for the benefits to be seen. In some cases, up to 10 days.  Duration: Steroids will stay in the system x 2 weeks. Duration of benefits will depend on multiple posibilities including persistent irritating factors.  Occasional side-effects: Facial flushing (red, warm cheeks) , cramps (if present, drink Gatorade and take over-the-counter Magnesium 450-500 mg once to twice a day).  From procedure: Some discomfort is to be expected once the numbing medicine wears off. This should be minimal if ice and heat are applied as instructed.  Call if:  You experience numbness and weakness that gets worse with time, as opposed to wearing off.  New onset bowel or bladder incontinence. (This applies to Spinal procedures only)  Emergency Numbers:  Durning business hours (Monday - Thursday, 8:00 AM - 4:00 PM) (Friday, 9:00 AM - 12:00 Noon): [phone]  After hours: [phone] ____________________________________________________________________________________________   Epidural Steroid Injection An epidural steroid injection is a shot of steroid medicine and numbing medicine that is given into the space between the spinal cord and the bones in your back (epidural space). The shot helps relieve pain caused by an irritated or swollen nerve root. The amount of pain relief you get from the injection depends on what is causing the nerve to be swollen and irritated, and how long your pain  lasts. You are more likely to benefit from this injection if your pain is strong and comes on suddenly rather than if you have had pain for a long time. Tell a health care provider  about:  Any allergies you have.  All medicines you are taking, including vitamins, herbs, eye drops, creams, and over-the-counter medicines.  Any problems you or family members have had with anesthetic medicines.  Any blood disorders you have.  Any surgeries you have had.  Any medical conditions you have.  Whether you are pregnant or may be pregnant. What are the risks? Generally, this is a safe procedure. However, problems may occur, including:  Headache.  Bleeding.  Infection.  Allergic reaction to medicines.  Damage to your nerves.  What happens before the procedure? Staying hydrated Follow instructions from your health care provider about hydration, which may include:  Up to 2 hours before the procedure - you may continue to drink clear liquids, such as water, clear fruit juice, black coffee, and plain tea.  Eating and drinking restrictions Follow instructions from your health care provider about eating and drinking, which may include:  8 hours before the procedure - stop eating heavy meals or foods such as meat, fried foods, or fatty foods.  6 hours before the procedure - stop eating light meals or foods, such as toast or cereal.  6 hours before the procedure - stop drinking milk or drinks that contain milk.  2 hours before the procedure - stop drinking clear liquids.  Medicine  You may be given medicines to lower anxiety.  Ask your health care provider about: ? Changing or stopping your regular medicines. This is especially important if you are taking diabetes medicines or blood thinners. ? Taking medicines such as aspirin and ibuprofen. These medicines can thin your blood. Do not take these medicines before your procedure if your health care provider instructs you not to. General instructions  Plan to have someone take you home from the hospital or clinic. What happens during the procedure?  You may receive a medicine to help you relax  (sedative).  You will be asked to lie on your abdomen.  The injection site will be cleaned.  A numbing medicine (local anesthetic) will be used to numb the injection site.  A needle will be inserted through your skin into the epidural space. You may feel some discomfort when this happens. An X-ray machine will be used to make sure the needle is put as close as possible to the affected nerve.  A steroid medicine and a local anesthetic will be injected into the epidural space.  The needle will be removed.  A bandage (dressing) will be put over the injection site. What happens after the procedure?  Your blood pressure, heart rate, breathing rate, and blood oxygen level will be monitored until the medicines you were given have worn off.  Your arm or leg may feel weak or numb for a few hours.  The injection site may feel sore.  Do not drive for 24 hours if you received a sedative. This information is not intended to replace advice given to you by your health care provider. Make sure you discuss any questions you have with your health care provider. Document Released: 10/02/2007 Document Revised: 12/07/2015 Document Reviewed: 10/11/2015 Elsevier Interactive Patient Education  [employer].

## 2018-06-09 NOTE — Progress Notes (Signed)
Safety precautions to be maintained throughout the outpatient stay will include: orient to surroundings, keep bed in low position, maintain call bell within reach at all times, provide assistance with transfer out of bed and ambulation.  

## 2018-06-10 ENCOUNTER — Telehealth: Payer: Self-pay

## 2018-06-10 NOTE — Telephone Encounter (Signed)
Post procedure phone call.  LM 

## 2018-06-24 DIAGNOSIS — M25552 Pain in left hip: Secondary | ICD-10-CM | POA: Diagnosis not present

## 2018-06-24 DIAGNOSIS — Z96641 Presence of right artificial hip joint: Secondary | ICD-10-CM | POA: Diagnosis not present

## 2018-06-24 DIAGNOSIS — M5416 Radiculopathy, lumbar region: Secondary | ICD-10-CM | POA: Diagnosis not present
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
## 2018-07-10 ENCOUNTER — Encounter: Payer: Self-pay | Admitting: Urology

## 2018-07-10 ENCOUNTER — Ambulatory Visit (INDEPENDENT_AMBULATORY_CARE_PROVIDER_SITE_OTHER): Payer: Medicare Other | Admitting: Urology

## 2018-07-10 VITALS — BP 112/70 | HR 80 | Ht 61.0 in | Wt 231.9 lb

## 2018-07-10 DIAGNOSIS — R350 Frequency of micturition: Secondary | ICD-10-CM | POA: Diagnosis not present

## 2018-07-10 DIAGNOSIS — N3941 Urge incontinence: Secondary | ICD-10-CM | POA: Diagnosis not present

## 2018-07-10 DIAGNOSIS — R300 Dysuria: Secondary | ICD-10-CM | POA: Diagnosis not present

## 2018-07-10 DIAGNOSIS — R351 Nocturia: Secondary | ICD-10-CM | POA: Diagnosis not present

## 2018-07-10 LAB — URINALYSIS, COMPLETE
BILIRUBIN UA: NEGATIVE
Ketones, UA: NEGATIVE
Nitrite, UA: POSITIVE — AB
Specific Gravity, UA: 1.015 (ref 1.005–1.030)
Urobilinogen, Ur: 0.2 mg/dL (ref 0.2–1.0)
pH, UA: 5.5 (ref 5.0–7.5)

## 2018-07-10 LAB — MICROSCOPIC EXAMINATION: WBC, UA: >30 /hpf — ABNORMAL HIGH (ref 0–5)

## 2018-07-10 LAB — BLADDER SCAN AMB NON-IMAGING: Scan Result: 0

## 2018-07-14 ENCOUNTER — Telehealth: Payer: Self-pay

## 2018-07-14 LAB — CULTURE, URINE COMPREHENSIVE

## 2018-07-14 MED ORDER — CIPROFLOXACIN HCL 250 MG PO TABS: 250.0000 mg | ORAL_TABLET | Freq: Two times a day (BID) | ORAL | 0 refills | Status: DC

## 2018-07-14 NOTE — Telephone Encounter (Signed)
-----   Message from Nori Riis, PA-C sent at 07/14/2018 12:18 PM EST ----- Please let Mr. Foots know that his urine culture was positive for infection.  He needs to start Cipro 250 mg, BID for seven days.

## 2018-07-14 NOTE — Telephone Encounter (Signed)
Patient notified on vmail per DPR, script sent

## 2018-07-15 ENCOUNTER — Encounter: Payer: Self-pay | Admitting: Student in an Organized Health Care Education/Training Program

## 2018-07-15 ENCOUNTER — Ambulatory Visit
Payer: Medicare Other | Attending: Student in an Organized Health Care Education/Training Program | Admitting: Student in an Organized Health Care Education/Training Program

## 2018-07-15 VITALS — BP 124/73 | HR 90 | Temp 98.0°F | Resp 16 | Ht 61.0 in | Wt 225.0 lb

## 2018-07-15 DIAGNOSIS — M5136 Other intervertebral disc degeneration, lumbar region: Secondary | ICD-10-CM | POA: Diagnosis not present

## 2018-07-15 DIAGNOSIS — G894 Chronic pain syndrome: Secondary | ICD-10-CM | POA: Diagnosis not present

## 2018-07-15 DIAGNOSIS — I2510 Atherosclerotic heart disease of native coronary artery without angina pectoris: Secondary | ICD-10-CM | POA: Diagnosis not present

## 2018-07-15 DIAGNOSIS — M48061 Spinal stenosis, lumbar region without neurogenic claudication: Secondary | ICD-10-CM | POA: Insufficient documentation

## 2018-07-15 DIAGNOSIS — Z9889 Other specified postprocedural states: Secondary | ICD-10-CM

## 2018-07-15 DIAGNOSIS — M5416 Radiculopathy, lumbar region: Secondary | ICD-10-CM | POA: Insufficient documentation

## 2018-07-15 MED ORDER — HYDROCODONE-ACETAMINOPHEN 5-325 MG PO TABS: 1.0000 | ORAL_TABLET | Freq: Two times a day (BID) | ORAL | 0 refills | Status: DC | PRN

## 2018-07-15 NOTE — Progress Notes (Signed)
Safety precautions to be maintained throughout the outpatient stay will include: orient to surroundings, keep bed in low position, maintain call bell within reach at all times, provide assistance with transfer out of bed and ambulation.  

## 2018-07-15 NOTE — Patient Instructions (Addendum)
Hydrocodone with acetaminophen to last until 09/02/2018 has been escribed to your pharmacy.  Epidural Steroid Injection Patient Information  Description: The epidural space surrounds the nerves as they exit the spinal cord.  In some patients, the nerves can be compressed and inflamed by a bulging disc or a tight spinal canal (spinal stenosis).  By injecting steroids into the epidural space, we can bring irritated nerves into direct contact with a potentially helpful medication.  These steroids act directly on the irritated nerves and can reduce swelling and inflammation which often leads to decreased pain.  Epidural steroids may be injected anywhere along the spine and from the neck to the low back depending upon the location of your pain.   After numbing the skin with local anesthetic (like Novocaine), a small needle is passed into the epidural space slowly.  You may experience a sensation of pressure while this is being done.  The entire block usually last less than 10 minutes.  Conditions which may be treated by epidural steroids:   Low back and leg pain  Neck and arm pain  Spinal stenosis  Post-laminectomy syndrome  Herpes zoster (shingles) pain  Pain from compression fractures  Preparation for the injection:  1. Do not eat any solid food or dairy products within 8 hours of your appointment.  2. You may drink clear liquids up to 3 hours before appointment.  Clear liquids include water, black coffee, juice or soda.  No milk or cream please. 3. You may take your regular medication, including pain medications, with a sip of water before your appointment  Diabetics should hold regular insulin (if taken separately) and take 1/2 normal NPH dos the morning of the procedure.  Carry some sugar containing items with you to your appointment. 4. A driver must accompany you and be prepared to drive you home after your procedure.  5. Bring all your current medications with your. 6. An IV may be  inserted and sedation may be given at the discretion of the physician.   7. A blood pressure cuff, EKG and other monitors will often be applied during the procedure.  Some patients may need to have extra oxygen administered for a short period. 8. You will be asked to provide medical information, including your allergies, prior to the procedure.  We must know immediately if you are taking blood thinners (like Coumadin/Warfarin)  Or if you are allergic to IV iodine contrast (dye). We must know if you could possible be pregnant.  Possible side-effects:  Bleeding from needle site  Infection (rare, may require surgery)  Nerve injury (rare)  Numbness & tingling (temporary)  Difficulty urinating (rare, temporary)  Spinal headache ( a headache worse with upright posture)  Light -headedness (temporary)  Pain at injection site (several days)  Decreased blood pressure (temporary)  Weakness in arm/leg (temporary)  Pressure sensation in back/neck (temporary)  Call if you experience:  Fever/chills associated with headache or increased back/neck pain.  Headache worsened by an upright position.  New onset weakness or numbness of an extremity below the injection site  Hives or difficulty breathing (go to the emergency room)  Inflammation or drainage at the infection site  Severe back/neck pain  Any new symptoms which are concerning to you  Please note:  Although the local anesthetic injected can often make your back or neck feel good for several hours after the injection, the pain will likely return.  It takes 3-7 days for steroids to work in the epidural space.  You may not notice any pain relief for at least that one week.  If effective, we will often do a series of three injections spaced 3-6 weeks apart to maximally decrease your pain.  After the initial series, we generally will wait several months before considering a repeat injection of the same type.  If you have any  questions, please call [phone] [facility]  What are the risk, side effects and possible complications? Generally speaking, most procedures are safe.  However, with any procedure there are risks, side effects, and the possibility of complications.  The risks and complications are dependent upon the sites that are lesioned, or the type of nerve block to be performed.  The closer the procedure is to the spine, the more serious the risks are.  Great care is taken when placing the radio frequency needles, block needles or lesioning probes, but sometimes complications can occur. 1. Infection: Any time there is an injection through the skin, there is a risk of infection.  This is why sterile conditions are used for these blocks.  There are four possible types of infection. 1. Localized skin infection. 2. Central Nervous System Infection-This can be in the form of Meningitis, which can be deadly. 3. Epidural Infections-This can be in the form of an epidural abscess, which can cause pressure inside of the spine, causing compression of the spinal cord with subsequent paralysis. This would require an emergency surgery to decompress, and there are no guarantees that the patient would recover from the paralysis. 4. Discitis-This is an infection of the intervertebral discs.  It occurs in about 1% of discography procedures.  It is difficult to treat and it may lead to surgery.        2. Pain: the needles have to go through skin and soft tissues, will cause soreness.       3. Damage to internal structures:  The nerves to be lesioned may be near blood vessels or    other nerves which can be potentially damaged.       4. Bleeding: Bleeding is more common if the patient is taking blood thinners such as  aspirin, Coumadin, Ticiid, Plavix, etc., or if he/she have some genetic predisposition  such as hemophilia. Bleeding into the spinal canal can  cause compression of the spinal  cord with subsequent paralysis.  This would require an emergency surgery to  decompress and there are no guarantees that the patient would recover from the  paralysis.       5. Pneumothorax:  Puncturing of a lung is a possibility, every time a needle is introduced in  the area of the chest or upper back.  Pneumothorax refers to free air around the  collapsed lung(s), inside of the thoracic cavity (chest cavity).  Another two possible  complications related to a similar event would include: Hemothorax and Chylothorax.   These are variations of the Pneumothorax, where instead of air around the collapsed  lung(s), you may have blood or chyle, respectively.       6. Spinal headaches: They may occur with any procedures in the area of the spine.       7. Persistent CSF (Cerebro-Spinal Fluid) leakage: This is a rare problem, but may occur  with prolonged intrathecal or epidural catheters either due to the formation of a fistulous  track or a dural tear.       8. Nerve damage: By working so close  to the spinal cord, there is always a possibility of  nerve damage, which could be as serious as a permanent spinal cord injury with  paralysis.       9. Death:  Although rare, severe deadly allergic reactions known as "Anaphylactic  reaction" can occur to any of the medications used.      10. Worsening of the symptoms:  We can always make thing worse.  What are the chances of something like this happening? Chances of any of this occuring are extremely low.  By statistics, you have more of a chance of getting killed in a motor vehicle accident: while driving to the hospital than any of the above occurring .  Nevertheless, you should be aware that they are possibilities.  In general, it is similar to taking a shower.  Everybody knows that you can slip, hit your head and get killed.  Does that mean that you should not shower again?  Nevertheless always keep in mind that statistics do not mean  anything if you happen to be on the wrong side of them.  Even if a procedure has a 1 (one) in a 1,000,000 (million) chance of going wrong, it you happen to be that one..Also, keep in mind that by statistics, you have more of a chance of having something go wrong when taking medications.  Who should not have this procedure? If you are on a blood thinning medication (e.g. Coumadin, Plavix, see list of "Blood Thinners"), or if you have an active infection going on, you should not have the procedure.  If you are taking any blood thinners, please inform your physician.  How should I prepare for this procedure?  Do not eat or drink anything at least six hours prior to the procedure.  Bring a driver with you .  It cannot be a taxi.  Come accompanied by an adult that can drive you back, and that is strong enough to help you if your legs get weak or numb from the local anesthetic.  Take all of your medicines the morning of the procedure with just enough water to swallow them.  If you have diabetes, make sure that you are scheduled to have your procedure done first thing in the morning, whenever possible.  If you have diabetes, take only half of your insulin dose and notify our nurse that you have done so as soon as you arrive at the clinic.  If you are diabetic, but only take blood sugar pills (oral hypoglycemic), then do not take them on the morning of your procedure.  You may take them after you have had the procedure.  Do not take aspirin or any aspirin-containing medications, at least eleven (11) days prior to the procedure.  They may prolong bleeding.  Wear loose fitting clothing that may be easy to take off and that you would not mind if it got stained with Betadine or blood.  Do not wear any jewelry or perfume  Remove any nail coloring.  It will interfere with some of our monitoring equipment.  NOTE: Remember that this is not meant to be interpreted as a complete list of all possible  complications.  Unforeseen problems may occur.  BLOOD THINNERS The following drugs contain aspirin or other products, which can cause increased bleeding during surgery and should not be taken for 2 weeks prior to and 1 week after surgery.  If you should need take something for relief of minor pain, you may take acetaminophen which is  found in Tylenol,m Datril, Anacin-3 and Panadol. It is not blood thinner. The products listed below are.  Do not take any of the products listed below in addition to any listed on your instruction sheet.  A.P.C or A.P.C with Codeine Codeine Phosphate Capsules #3 Ibuprofen Ridaura  ABC compound Congesprin Imuran rimadil  Advil Cope Indocin Robaxisal  Alka-Seltzer Effervescent Pain Reliever and Antacid Coricidin or Coricidin-D  Indomethacin Rufen  Alka-Seltzer plus Cold Medicine Cosprin Ketoprofen S-A-C Tablets  Anacin Analgesic Tablets or Capsules Coumadin Korlgesic Salflex  Anacin Extra Strength Analgesic tablets or capsules CP-2 Tablets Lanoril Salicylate  Anaprox Cuprimine Capsules Levenox Salocol  Anexsia-D Dalteparin Magan Salsalate  Anodynos Darvon compound Magnesium Salicylate Sine-off  Ansaid Dasin Capsules Magsal Sodium Salicylate  Anturane Depen Capsules Marnal Soma  APF Arthritis pain formula Dewitt's Pills Measurin Stanback  Argesic Dia-Gesic Meclofenamic Sulfinpyrazone  Arthritis Bayer Timed Release Aspirin Diclofenac Meclomen Sulindac  Arthritis pain formula Anacin Dicumarol Medipren Supac  Analgesic (Safety coated) Arthralgen Diffunasal Mefanamic Suprofen  Arthritis Strength Bufferin Dihydrocodeine Mepro Compound Suprol  Arthropan liquid Dopirydamole Methcarbomol with Aspirin Synalgos  ASA tablets/Enseals Disalcid Micrainin Tagament  Ascriptin Doan's Midol Talwin  Ascriptin A/D Dolene Mobidin Tanderil  Ascriptin Extra Strength Dolobid Moblgesic Ticlid  Ascriptin with Codeine Doloprin or Doloprin with Codeine Momentum Tolectin  Asperbuf Duoprin  Mono-gesic Trendar  Aspergum Duradyne Motrin or Motrin IB Triminicin  Aspirin plain, buffered or enteric coated Durasal Myochrisine Trigesic  Aspirin Suppositories Easprin Nalfon Trillsate  Aspirin with Codeine Ecotrin Regular or Extra Strength Naprosyn Uracel  Atromid-S Efficin Naproxen Ursinus  Auranofin Capsules Elmiron Neocylate Vanquish  Axotal Emagrin Norgesic Verin  Azathioprine Empirin or Empirin with Codeine Normiflo Vitamin E  Azolid Emprazil Nuprin Voltaren  Bayer Aspirin plain, buffered or children's or timed BC Tablets or powders Encaprin Orgaran Warfarin Sodium  Buff-a-Comp Enoxaparin Orudis Zorpin  Buff-a-Comp with Codeine Equegesic Os-Cal-Gesic   Buffaprin Excedrin plain, buffered or Extra Strength Oxalid   Bufferin Arthritis Strength Feldene Oxphenbutazone   Bufferin plain or Extra Strength Feldene Capsules Oxycodone with Aspirin   Bufferin with Codeine Fenoprofen Fenoprofen Pabalate or Pabalate-SF   Buffets II Flogesic Panagesic   Buffinol plain or Extra Strength Florinal or Florinal with Codeine Panwarfarin   Buf-Tabs Flurbiprofen Penicillamine   Butalbital Compound Four-way cold tablets Penicillin   Butazolidin Fragmin Pepto-Bismol   Carbenicillin Geminisyn Percodan   Carna Arthritis Reliever Geopen Persantine   Carprofen Gold's salt Persistin   Chloramphenicol Goody's Phenylbutazone   Chloromycetin Haltrain Piroxlcam   Clmetidine heparin Plaquenil   Cllnoril Hyco-pap Ponstel   Clofibrate Hydroxy chloroquine Propoxyphen         Before stopping any of these medications, be sure to consult the physician who ordered them.  Some, such as Coumadin (Warfarin) are ordered to prevent or treat serious conditions such as "deep thrombosis", "pumonary embolisms", and other heart problems.  The amount of time that you may need off of the medication may also vary with the medication and the reason for which you were taking it.  If you are taking any of these medications, please  make sure you notify your pain physician before you undergo any procedures.

## 2018-07-15 NOTE — Progress Notes (Signed)
Patient's Name: Mark Terry.  MRN: [id number]  Referring Provider: Birdie Sons, MD  DOB: 1935-04-01  PCP: Birdie Sons, MD  DOS: 07/15/2018  Note by: Gillis Santa, MD  Service setting: Ambulatory outpatient  Specialty: Interventional Pain Management  Location: [facility] (AMB) Pain Management Facility    Patient type: Established   Primary Reason(s) for Visit: Encounter for post-procedure evaluation of chronic illness with mild to moderate exacerbation CC: Back Pain (bilateral ) and Leg Pain (left)  HPI  Mark Terry is a 84 y.o. year old, male patient, who comes today for a post-procedure evaluation. He has Allergic rhinitis; Angina pectoris ([place]); Arthritis; Arthropathia; At risk for falling; Back pain with radiation; BPH (benign prostatic hyperplasia); CAD (coronary artery disease); Carpal tunnel syndrome; Cervical radiculitis; Cervical spinal stenosis; CHF (congestive heart failure) ([place]); Chronic gastritis; DDD (degenerative disc disease), cervical; Diabetes mellitus with nephropathy ([place]); Essential (primary) hypertension; Fatty infiltration of liver; GERD (gastroesophageal reflux disease); History of colonic polyps; Hypercholesterolemia; Decreased potassium in the blood; Neuritis or radiculitis due to rupture of lumbar intervertebral disc; Morbid obesity ([place]); Cervical pain; Pilonidal cyst; Skin lesion; OSA on CPAP; Change in blood platelet count; Insomnia; Arthritis of ankle, right; Varus foot deformity, acquired; Arthritis of ankle or foot, degenerative; Urge incontinence; Nocturia; Urinary frequency; DDD (degenerative disc disease), lumbar; Primary osteoarthritis of left hip; Status post total replacement of right hip; Lumbar spondylosis; Dysphagia; Chronic kidney disease (CKD), active medical management without dialysis, stage 3 (moderate) ([place]); Chronic pain syndrome; Lumbar radiculopathy; and History of lumbar laminectomy for spinal cord decompression (L2/3) on their problem list. His  primarily concern today is the Back Pain (bilateral ) and Leg Pain (left)  Pain Assessment: Location: Lower, Left, Right Back Radiating: mainly into the left leg now.   Onset: More than a month ago Duration: Chronic pain Quality: Discomfort, Dull, Sharp, Other (Comment), Spasm, Constant(weakness in the legs. ) Severity: 8 /10 (subjective, self-reported pain score)  Note: Reported level is inconsistent with clinical observations. Clinically the patient looks like a 3/10 A 3/10 is viewed as "Moderate" and described as significantly interfering with activities of daily living (ADL). It becomes difficult to feed, bathe, get dressed, get on and off the toilet or to perform personal hygiene functions. Difficult to get in and out of bed or a chair without assistance. Very distracting. With effort, it can be ignored when deeply involved in activities. Information on the proper use of the pain scale provided to the patient today. When using our objective Pain Scale, levels between 6 and 10/10 are said to belong in an emergency room, as it progressively worsens from a 6/10, described as severely limiting, requiring emergency care not usually available at an outpatient pain management facility. At a 6/10 level, communication becomes difficult and requires great effort. Assistance to reach the emergency department may be required. Facial flushing and profuse sweating along with potentially dangerous increases in heart rate and blood pressure will be evident. Effect on ADL: walking from parking lot this morning very difficult.  Timing: Constant Modifying factors: medications BP: 124/73  HR: 90  Mark Terry comes in today for post-procedure evaluation.  Further details on both, my assessment(s), as well as the proposed treatment plan, please see below.  Patient follows up after left L5-S1 ESI #2 performed on 06/09/2018.  ESI 1 was performed on 05/05/2018.  Patient endorses near complete improvement in his pain  down his right leg however continues to endorse left-sided leg pain and weakness.  We  discussed repeating lumbar epidural steroid No. 3.  Patient would like to proceed.  Post-Procedure Assessment  06/09/2018 Procedure: Left L5-S1 ESI #2 Pre-procedure pain score:  5/10 Post-procedure pain score: 0/10         Influential Factors: BMI: 42.51 kg/m Intra-procedural challenges: None observed.         Assessment challenges: None detected.              Reported side-effects: None.        Post-procedural adverse reactions or complications: None reported         Sedation: Please see nurses note. When no sedatives are used, the analgesic levels obtained are directly associated to the effectiveness of the local anesthetics. However, when sedation is provided, the level of analgesia obtained during the initial 1 hour following the intervention, is believed to be the result of a combination of factors. These factors may include, but are not limited to: 1. The effectiveness of the local anesthetics used. 2. The effects of the analgesic(s) and/or anxiolytic(s) used. 3. The degree of discomfort experienced by the patient at the time of the procedure. 4. The patients ability and reliability in recalling and recording the events. 5. The presence and influence of possible secondary gains and/or psychosocial factors. Reported result: Relief experienced during the 1st hour after the procedure: 100 % (Ultra-Short Term Relief)            Interpretative annotation: Clinically appropriate result. Analgesia during this period is likely to be Local Anesthetic and/or IV Sedative (Analgesic/Anxiolytic) related.          Effects of local anesthetic: The analgesic effects attained during this period are directly associated to the localized infiltration of local anesthetics and therefore cary significant diagnostic value as to the etiological location, or anatomical origin, of the pain. Expected duration of relief is directly  dependent on the pharmacodynamics of the local anesthetic used. Long-acting (4-6 hours) anesthetics used.  Reported result: Relief during the next 4 to 6 hour after the procedure: 100 % (Short-Term Relief)            Interpretative annotation: Clinically appropriate result. Analgesia during this period is likely to be Local Anesthetic-related.          Long-term benefit: Defined as the period of time past the expected duration of local anesthetics (1 hour for short-acting and 4-6 hours for long-acting). With the possible exception of prolonged sympathetic blockade from the local anesthetics, benefits during this period are typically attributed to, or associated with, other factors such as analgesic sensory neuropraxia, antiinflammatory effects, or beneficial biochemical changes provided by agents other than the local anesthetics.  Reported result: Extended relief following procedure: 100 %(the pain started returning approx 1 week later.  the pain is nearly gone in the right leg,  continues to have pain in the left. ) (Long-Term Relief)            Interpretative annotation: Clinically possible results. Good relief. No permanent benefit expected. Inflammation plays a part in the etiology to the pain.          Current benefits: Defined as reported results that persistent at this point in time.   Analgesia: 75-100 % for the right leg, 25% for the left leg            Function: Somewhat improved ROM: Somewhat improved Interpretative annotation: Recurrence of symptoms. No permanent benefit expected. Effective diagnostic intervention.          Interpretation: Results would suggest  a successful diagnostic and therapeutic intervention.                  Plan:  Please see "Plan of Care" for details.              Repeat procedure focusing on left side.  Laboratory Chemistry  Inflammation Markers (CRP: Acute Phase) (ESR: Chronic Phase) No results found for: CRP, ESRSEDRATE, LATICACIDVEN                        Rheumatology Markers No results found for: RF, ANA, LABURIC, URICUR, LYMEIGGIGMAB, LYMEABIGMQN, HLAB27                      Renal Function Markers Lab Results  Component Value Date   BUN 26 05/16/2018   CREATININE 1.70 (H) 05/16/2018   BCR 15 05/16/2018   GFRAA 43 (L) 05/16/2018   GFRNONAA 37 (L) 05/16/2018                             Hepatic Function Markers Lab Results  Component Value Date   AST 32 05/16/2018   ALT 36 05/16/2018   ALBUMIN 4.3 05/16/2018   ALKPHOS 53 05/16/2018                        Electrolytes Lab Results  Component Value Date   NA 141 05/16/2018   K 4.4 05/16/2018   CL 101 05/16/2018   CALCIUM 9.7 05/16/2018   PHOS 3.0 05/13/2017                        Neuropathy Markers Lab Results  Component Value Date   VITAMINB12 728 10/10/2015   HGBA1C 7.7 (H) 05/16/2018                        CNS Tests No results found for: COLORCSF, APPEARCSF, RBCCOUNTCSF, WBCCSF, POLYSCSF, LYMPHSCSF, EOSCSF, PROTEINCSF, GLUCCSF, JCVIRUS, CSFOLI, IGGCSF                      Bone Pathology Markers Lab Results  Component Value Date   VD25OH 47.6 10/10/2015                         Coagulation Parameters Lab Results  Component Value Date   INR 0.99 05/27/2012   LABPROT 13.0 05/27/2012   APTT 31 05/27/2012   PLT 98 (LL) 05/16/2018   LABHEMA Note: 05/16/2018                        Cardiovascular Markers Lab Results  Component Value Date   TROPONINI <0.03 11/05/2015   HGB 12.4 (L) 05/16/2018   HCT 37.0 (L) 05/16/2018                         CA Markers No results found for: CEA, CA125, LABCA2                      Note: Lab results reviewed.  Recent Diagnostic Imaging Results  DG C-Arm 1-60 Min-No Report Fluoroscopy was utilized by the requesting physician.  No radiographic  interpretation.   Complexity Note: Imaging results reviewed. Results shared with Mark Terry, using Layman's terms.  Meds   Current Outpatient  Medications:  .  aspirin 81 MG tablet, Take 81 mg by mouth daily., Disp: , Rfl:  .  Blood Glucose Monitoring Suppl (ACCU-CHEK GUIDE) w/Device KIT, 1 Device by Does not apply route daily., Disp: 1 kit, Rfl: 0 .  ciprofloxacin (CIPRO) 250 MG tablet, Take 1 tablet (250 mg total) by mouth 2 (two) times daily., Disp: 14 tablet, Rfl: 0 .  doxazosin (CARDURA) 2 MG tablet, TAKE 1 TABLET BY MOUTH  DAILY, Disp: 90 tablet, Rfl: 4 .  fluticasone (FLONASE) 50 MCG/ACT nasal spray, USE 1 TO 2 SPRAYS IN EACH  NOSTRIL EVERY DAY, Disp: 48 g, Rfl: 5 .  furosemide (LASIX) 40 MG tablet, TAKE 1 TABLET BY MOUTH  DAILY, Disp: 90 tablet, Rfl: 4 .  gabapentin (NEURONTIN) 300 MG capsule, TAKE 1 CAPSULE BY MOUTH TWO TIMES DAILY, Disp: 180 capsule, Rfl: 3 .  glimepiride (AMARYL) 1 MG tablet, TAKE 1 TABLET BY MOUTH  DAILY, Disp: 90 tablet, Rfl: 4 .  glucose blood (ACCU-CHEK GUIDE) test strip, Use to check blood sugar once daily for type 2 diabetes, e11.9, Disp: 100 each, Rfl: 4 .  [START ON 08/03/2018] HYDROcodone-acetaminophen (NORCO/VICODIN) 5-325 MG tablet, Take 1 tablet by mouth 2 (two) times daily as needed for moderate pain., Disp: 60 tablet, Rfl: 0 .  ibuprofen (ADVIL,MOTRIN) 200 MG tablet, Take 200 mg by mouth every 6 (six) hours as needed., Disp: , Rfl:  .  JARDIANCE 25 MG TABS tablet, TAKE 1 TABLET BY MOUTH  DAILY, Disp: 90 tablet, Rfl: 4 .  loratadine (CLARITIN) 10 MG tablet, Take 10 mg by mouth daily., Disp: , Rfl:  .  meloxicam (MOBIC) 15 MG tablet, Take 1 tablet (15 mg total) by mouth daily as needed., Disp: 90 tablet, Rfl: 3 .  metFORMIN (GLUCOPHAGE-XR) 500 MG 24 hr tablet, Take 1 tablet (500 mg total) by mouth daily., Disp: 3 tablet, Rfl: 0 .  metoprolol tartrate (LOPRESSOR) 25 MG tablet, TAKE 1 TABLET BY MOUTH TWO  TIMES DAILY, Disp: 180 tablet, Rfl: 4 .  mirtazapine (REMERON) 7.5 MG tablet, Take 7.5 mg by mouth at bedtime., Disp: , Rfl:  .  Multiple Vitamin (MULTIVITAMIN WITH MINERALS) TABS, Take 1 tablet by  mouth daily., Disp: , Rfl:  .  omeprazole (PRILOSEC) 40 MG capsule, Take 1 capsule (40 mg total) by mouth daily., Disp: 90 capsule, Rfl: 3 .  oxybutynin (DITROPAN XL) 15 MG 24 hr tablet, TAKE 1 TABLET BY MOUTH AT  BEDTIME, Disp: 90 tablet, Rfl: 3 .  potassium chloride SA (K-DUR,KLOR-CON) 20 MEQ tablet, TAKE 1 TABLET BY MOUTH  DAILY, Disp: 90 tablet, Rfl: 3 .  simvastatin (ZOCOR) 40 MG tablet, TAKE 1 TABLET BY MOUTH AT  BEDTIME, Disp: 90 tablet, Rfl: 4 .  traZODone (DESYREL) 100 MG tablet, TAKE 0.5-1 TABLET BY MOUTH  AT BEDTIME AS NEEDED FOR  SLEEP., Disp: 90 tablet, Rfl: 4  ROS  Constitutional: Denies any fever or chills Gastrointestinal: No reported hemesis, hematochezia, vomiting, or acute GI distress Musculoskeletal: Denies any acute onset joint swelling, redness, loss of ROM, or weakness Neurological: No reported episodes of acute onset apraxia, aphasia, dysarthria, agnosia, amnesia, paralysis, loss of coordination, or loss of consciousness  Allergies  Mark Terry is allergic to propoxyphene; codeine sulfate; darvon [propoxyphene hcl]; demerol [meperidine]; and oxycodone.  [place]  Drug: Mark Terry  reports no history of drug use. Alcohol:  reports no history of alcohol use. Tobacco:  reports that he quit smoking about 50 years  ago. His smoking use included cigarettes. He has a 25.00 pack-year smoking history. He has never used smokeless tobacco. Medical:  has a past medical history of Anemia, Bruises easily, Diabetes mellitus without complication ([place]), H/O esophagogastroduodenoscopy, H/O hiatal hernia, History of blood transfusion, History of bronchitis, History of gastric ulcer, History of MRSA infection, Leg cramps, Melanoma ([place]), Panic attacks, Pneumonia, and PONV (postoperative nausea and vomiting). Surgical: Mark Terry  has a past surgical history that includes Cholecystectomy (1970); Replacement total knee bilateral ('86 and 2000); Total hip arthroplasty (Right, 2012); Neck surgery;  Coronary artery bypass graft (2000); Colonoscopy; bilateral cataract surgery; Reverse shoulder arthroplasty (05/30/2012); Back surgery; Ankle Fusion (Right, 11/04/2015); prostate laser surgery; Coronary angioplasty; Joint replacement; and Esophagogastroduodenoscopy (egd) with propofol (N/A, 11/18/2017). Family: family history includes Alzheimer's disease in his mother; Bipolar disorder in his brother; Heart attack in his father.  Constitutional Exam  General appearance: Well nourished, well developed, and well hydrated. In no apparent acute distress Vitals:   07/15/18 0835  BP: 124/73  Pulse: 90  Resp: 16  Temp: 98 F (36.7 C)  TempSrc: Oral  SpO2: 98%  Weight: 225 lb (102.1 kg)  Height: '5\' 1"'  (1.549 m)   BMI Assessment: Estimated body mass index is 42.51 kg/m as calculated from the following:   Height as of this encounter: '5\' 1"'  (1.549 m).   Weight as of this encounter: 225 lb (102.1 kg).  BMI interpretation table: BMI level Category Range association with higher incidence of chronic pain  <18 kg/m2 Underweight   18.5-24.9 kg/m2 Ideal body weight   25-29.9 kg/m2 Overweight Increased incidence by 20%  30-34.9 kg/m2 Obese (Class I) Increased incidence by 68%  35-39.9 kg/m2 Severe obesity (Class II) Increased incidence by 136%  >40 kg/m2 Extreme obesity (Class III) Increased incidence by 254%   Patient's current BMI Ideal Body weight  Body mass index is 42.51 kg/m. Ideal body weight: 52.3 kg (115 lb 4.8 oz) Adjusted ideal body weight: 72.2 kg (159 lb 2.9 oz)   BMI Readings from Last 4 Encounters:  07/15/18 42.51 kg/m  07/10/18 43.82 kg/m  06/09/18 43.08 kg/m  05/29/18 43.27 kg/m   Wt Readings from Last 4 Encounters:  07/15/18 225 lb (102.1 kg)  07/10/18 231 lb 14.4 oz (105.2 kg)  06/09/18 228 lb (103.4 kg)  05/29/18 229 lb (103.9 kg)  Psych/Mental status: Alert, oriented x 3 (person, place, & time)       Eyes: PERLA Respiratory: No evidence of acute respiratory  distress  Cervical Spine Area Exam  Skin & Axial Inspection: No masses, redness, edema, swelling, or associated skin lesions Alignment: Symmetrical Functional ROM: Unrestricted ROM      Stability: No instability detected Muscle Tone/Strength: Functionally intact. No obvious neuro-muscular anomalies detected. Sensory (Neurological): Unimpaired Palpation: No palpable anomalies              Upper Extremity (UE) Exam    Side: Right upper extremity  Side: Left upper extremity  Skin & Extremity Inspection: Skin color, temperature, and hair growth are WNL. No peripheral edema or cyanosis. No masses, redness, swelling, asymmetry, or associated skin lesions. No contractures.  Skin & Extremity Inspection: Skin color, temperature, and hair growth are WNL. No peripheral edema or cyanosis. No masses, redness, swelling, asymmetry, or associated skin lesions. No contractures.  Functional ROM: Unrestricted ROM          Functional ROM: Unrestricted ROM          Muscle Tone/Strength: Functionally intact. No obvious neuro-muscular anomalies  detected.  Muscle Tone/Strength: Functionally intact. No obvious neuro-muscular anomalies detected.  Sensory (Neurological): Unimpaired          Sensory (Neurological): Unimpaired          Palpation: No palpable anomalies              Palpation: No palpable anomalies              Provocative Test(s):  Phalen's test: deferred Tinel's test: deferred Apley's scratch test (touch opposite shoulder):  Action 1 (Across chest): deferred Action 2 (Overhead): deferred Action 3 (LB reach): deferred   Provocative Test(s):  Phalen's test: deferred Tinel's test: deferred Apley's scratch test (touch opposite shoulder):  Action 1 (Across chest): deferred Action 2 (Overhead): deferred Action 3 (LB reach): deferred    Thoracic Spine Area Exam  Skin & Axial Inspection: No masses, redness, or swelling Alignment: Symmetrical Functional ROM: Unrestricted ROM Stability: No  instability detected Muscle Tone/Strength: Functionally intact. No obvious neuro-muscular anomalies detected. Sensory (Neurological): Unimpaired Muscle strength & Tone: No palpable anomalies  Lumbar Spine Area Exam  Skin & Axial Inspection: Well healed scar from previous spine surgery detected Alignment: Symmetrical Functional ROM: Decreased ROM       Stability: No instability detected Muscle Tone/Strength: Functionally intact. No obvious neuro-muscular anomalies detected. Sensory (Neurological): Dermatomal pain pattern Palpation: No palpable anomalies       Provocative Tests: Hyperextension/rotation test: (+) due to pain. Lumbar quadrant test (Kemp's test): deferred today       Lateral bending test: (+) ipsilateral radicular pain, on the left. Positive for left-sided foraminal stenosis. Patrick's Maneuver: deferred today                   FABER* test: deferred today                   S-I anterior distraction/compression test: deferred today         S-I lateral compression test: deferred today         S-I Thigh-thrust test: deferred today         S-I Gaenslen's test: deferred today         *(Flexion, ABduction and External Rotation)  Gait & Posture Assessment  Ambulation: Limited Gait: Limited. Using assistive device to ambulate Posture: Difficulty standing up straight, due to pain   Lower Extremity Exam    Side: Right lower extremity  Side: Left lower extremity  Stability: No instability observed          Stability: No instability observed          Skin & Extremity Inspection: Skin color, temperature, and hair growth are WNL. No peripheral edema or cyanosis. No masses, redness, swelling, asymmetry, or associated skin lesions. No contractures.  Skin & Extremity Inspection: Skin color, temperature, and hair growth are WNL. No peripheral edema or cyanosis. No masses, redness, swelling, asymmetry, or associated skin lesions. No contractures.  Functional ROM: Diminished ROM for all  joints of the lower extremity          Functional ROM: Unrestricted ROM                  Muscle Tone/Strength: Functionally intact. No obvious neuro-muscular anomalies detected.  Muscle Tone/Strength: Functionally intact. No obvious neuro-muscular anomalies detected.  Sensory (Neurological): Dermatomal pain pattern top of foot (L5) and S1  Sensory (Neurological): Unimpaired        DTR: Patellar: 1+: trace Achilles: 1+: trace Plantar: deferred today  DTR: Patellar: deferred today  Achilles: deferred today Plantar: deferred today  Palpation: No palpable anomalies  Palpation: No palpable anomalies   Assessment  Primary Diagnosis & Pertinent Problem List: The primary encounter diagnosis was Lumbar radiculopathy. Diagnoses of History of lumbar laminectomy for spinal cord decompression (L2/3), Lumbar foraminal stenosis, Chronic pain syndrome, Lumbar degenerative disc disease, and Coronary artery disease involving native coronary artery of native heart without angina pectoris were also pertinent to this visit.  Status Diagnosis  Controlled Controlled Controlled 1. Lumbar radiculopathy   2. History of lumbar laminectomy for spinal cord decompression (L2/3)   3. Lumbar foraminal stenosis   4. Chronic pain syndrome   5. Lumbar degenerative disc disease   6. Coronary artery disease involving native coronary artery of native heart without angina pectoris      Lumbar radiculopathy: Status post left L5-S1 ESI #2 on 06/19/2018.  #1 was on 05/05/2018.  Patient endorses complete pain relief down his right leg but continues to endorse left leg pain.  Discussed repeating lumbar ESI #3.  Risks and benefits discussed.  Patient like to proceed.  Also refill patient's hydrocodone as below.  [place] PMP checked and appropriate.  Plan: -Continue gabapentin as prescribed -Refill of hydrocodone as below 5 mg twice daily as needed, fill date for 08/03/2017 -Continue ibuprofen 20 mg every 6 hours as  needed -Repeat left L5-S1 ESI #3.  Plan of Care  Pharmacotherapy (Medications Ordered): Meds ordered this encounter  Medications  . HYDROcodone-acetaminophen (NORCO/VICODIN) 5-325 MG tablet    Sig: Take 1 tablet by mouth 2 (two) times daily as needed for moderate pain.    Dispense:  60 tablet    Refill:  0   Lab-work, procedure(s), and/or referral(s): Orders Placed This Encounter  Procedures  . Lumbar Epidural Injection    Provider-requested follow-up: Return for Procedure.  Future Appointments  Date Time Provider [place]  07/23/2018  9:45 AM Gillis Santa, MD [facility] None  08/14/2018  9:30 AM Gillis Santa, MD [facility] None  09/17/2018  8:20 AM Birdie Sons, MD [facility] None  07/14/2019  9:00 AM Ernestine Conrad, Marlowe Kays None    Primary Care Physician: Birdie Sons, MD Location: [facility] Note by: Gillis Santa, M.D Date: 07/15/2018; Time: 9:22 AM  Patient Instructions   Hydrocodone with acetaminophen to last until 09/02/2018 has been escribed to your pharmacy.  Epidural Steroid Injection Patient Information  Description: The epidural space surrounds the nerves as they exit the spinal cord.  In some patients, the nerves can be compressed and inflamed by a bulging disc or a tight spinal canal (spinal stenosis).  By injecting steroids into the epidural space, we can bring irritated nerves into direct contact with a potentially helpful medication.  These steroids act directly on the irritated nerves and can reduce swelling and inflammation which often leads to decreased pain.  Epidural steroids may be injected anywhere along the spine and from the neck to the low back depending upon the location of your pain.   After numbing the skin with local anesthetic (like Novocaine), a small needle is passed into the epidural space slowly.  You may experience a sensation of pressure while this is being done.  The entire block usually last  less than 10 minutes.  Conditions which may be treated by epidural steroids:   Low back and leg pain  Neck and arm pain  Spinal stenosis  Post-laminectomy syndrome  Herpes zoster (shingles) pain  Pain from compression fractures  Preparation for  the injection:  1. Do not eat any solid food or dairy products within 8 hours of your appointment.  2. You may drink clear liquids up to 3 hours before appointment.  Clear liquids include water, black coffee, juice or soda.  No milk or cream please. 3. You may take your regular medication, including pain medications, with a sip of water before your appointment  Diabetics should hold regular insulin (if taken separately) and take 1/2 normal NPH dos the morning of the procedure.  Carry some sugar containing items with you to your appointment. 4. A driver must accompany you and be prepared to drive you home after your procedure.  5. Bring all your current medications with your. 6. An IV may be inserted and sedation may be given at the discretion of the physician.   7. A blood pressure cuff, EKG and other monitors will often be applied during the procedure.  Some patients may need to have extra oxygen administered for a short period. 8. You will be asked to provide medical information, including your allergies, prior to the procedure.  We must know immediately if you are taking blood thinners (like Coumadin/Warfarin)  Or if you are allergic to IV iodine contrast (dye). We must know if you could possible be pregnant.  Possible side-effects:  Bleeding from needle site  Infection (rare, may require surgery)  Nerve injury (rare)  Numbness & tingling (temporary)  Difficulty urinating (rare, temporary)  Spinal headache ( a headache worse with upright posture)  Light -headedness (temporary)  Pain at injection site (several days)  Decreased blood pressure (temporary)  Weakness in arm/leg (temporary)  Pressure sensation in back/neck  (temporary)  Call if you experience:  Fever/chills associated with headache or increased back/neck pain.  Headache worsened by an upright position.  New onset weakness or numbness of an extremity below the injection site  Hives or difficulty breathing (go to the emergency room)  Inflammation or drainage at the infection site  Severe back/neck pain  Any new symptoms which are concerning to you  Please note:  Although the local anesthetic injected can often make your back or neck feel good for several hours after the injection, the pain will likely return.  It takes 3-7 days for steroids to work in the epidural space.  You may not notice any pain relief for at least that one week.  If effective, we will often do a series of three injections spaced 3-6 weeks apart to maximally decrease your pain.  After the initial series, we generally will wait several months before considering a repeat injection of the same type.  If you have any questions, please call [phone] [place]  What are the risk, side effects and possible complications? Generally speaking, most procedures are safe.  However, with any procedure there are risks, side effects, and the possibility of complications.  The risks and complications are dependent upon the sites that are lesioned, or the type of nerve block to be performed.  The closer the procedure is to the spine, the more serious the risks are.  Great care is taken when placing the radio frequency needles, block needles or lesioning probes, but sometimes complications can occur. 1. Infection: Any time there is an injection through the skin, there is a risk of infection.  This is why sterile conditions are used for these blocks.  There are four possible types of infection. 1. Localized skin infection. 2. Central  Nervous System Infection-This can be in the form of Meningitis, which can be  deadly. 3. Epidural Infections-This can be in the form of an epidural abscess, which can cause pressure inside of the spine, causing compression of the spinal cord with subsequent paralysis. This would require an emergency surgery to decompress, and there are no guarantees that the patient would recover from the paralysis. 4. Discitis-This is an infection of the intervertebral discs.  It occurs in about 1% of discography procedures.  It is difficult to treat and it may lead to surgery.        2. Pain: the needles have to go through skin and soft tissues, will cause soreness.       3. Damage to internal structures:  The nerves to be lesioned may be near blood vessels or    other nerves which can be potentially damaged.       4. Bleeding: Bleeding is more common if the patient is taking blood thinners such as  aspirin, Coumadin, Ticiid, Plavix, etc., or if he/she have some genetic predisposition  such as hemophilia. Bleeding into the spinal canal can cause compression of the spinal  cord with subsequent paralysis.  This would require an emergency surgery to  decompress and there are no guarantees that the patient would recover from the  paralysis.       5. Pneumothorax:  Puncturing of a lung is a possibility, every time a needle is introduced in  the area of the chest or upper back.  Pneumothorax refers to free air around the  collapsed lung(s), inside of the thoracic cavity (chest cavity).  Another two possible  complications related to a similar event would include: Hemothorax and Chylothorax.   These are variations of the Pneumothorax, where instead of air around the collapsed  lung(s), you may have blood or chyle, respectively.       6. Spinal headaches: They may occur with any procedures in the area of the spine.       7. Persistent CSF (Cerebro-Spinal Fluid) leakage: This is a rare problem, but may occur  with prolonged intrathecal or epidural catheters either due to the formation of a fistulous  track  or a dural tear.       8. Nerve damage: By working so close to the spinal cord, there is always a possibility of  nerve damage, which could be as serious as a permanent spinal cord injury with  paralysis.       9. Death:  Although rare, severe deadly allergic reactions known as "Anaphylactic  reaction" can occur to any of the medications used.      10. Worsening of the symptoms:  We can always make thing worse.  What are the chances of something like this happening? Chances of any of this occuring are extremely low.  By statistics, you have more of a chance of getting killed in a motor vehicle accident: while driving to the hospital than any of the above occurring .  Nevertheless, you should be aware that they are possibilities.  In general, it is similar to taking a shower.  Everybody knows that you can slip, hit your head and get killed.  Does that mean that you should not shower again?  Nevertheless always keep in mind that statistics do not mean anything if you happen to be on the wrong side of them.  Even if a procedure has a 1 (one) in a 1,000,000 (million) chance of going wrong, it you happen  to be that one..Also, keep in mind that by statistics, you have more of a chance of having something go wrong when taking medications.  Who should not have this procedure? If you are on a blood thinning medication (e.g. Coumadin, Plavix, see list of "Blood Thinners"), or if you have an active infection going on, you should not have the procedure.  If you are taking any blood thinners, please inform your physician.  How should I prepare for this procedure?  Do not eat or drink anything at least six hours prior to the procedure.  Bring a driver with you .  It cannot be a taxi.  Come accompanied by an adult that can drive you back, and that is strong enough to help you if your legs get weak or numb from the local anesthetic.  Take all of your medicines the morning of the procedure with just enough water  to swallow them.  If you have diabetes, make sure that you are scheduled to have your procedure done first thing in the morning, whenever possible.  If you have diabetes, take only half of your insulin dose and notify our nurse that you have done so as soon as you arrive at the clinic.  If you are diabetic, but only take blood sugar pills (oral hypoglycemic), then do not take them on the morning of your procedure.  You may take them after you have had the procedure.  Do not take aspirin or any aspirin-containing medications, at least eleven (11) days prior to the procedure.  They may prolong bleeding.  Wear loose fitting clothing that may be easy to take off and that you would not mind if it got stained with Betadine or blood.  Do not wear any jewelry or perfume  Remove any nail coloring.  It will interfere with some of our monitoring equipment.  NOTE: Remember that this is not meant to be interpreted as a complete list of all possible complications.  Unforeseen problems may occur.  BLOOD THINNERS The following drugs contain aspirin or other products, which can cause increased bleeding during surgery and should not be taken for 2 weeks prior to and 1 week after surgery.  If you should need take something for relief of minor pain, you may take acetaminophen which is found in Tylenol,m Datril, Anacin-3 and Panadol. It is not blood thinner. The products listed below are.  Do not take any of the products listed below in addition to any listed on your instruction sheet.  A.P.C or A.P.C with Codeine Codeine Phosphate Capsules #3 Ibuprofen Ridaura  ABC compound Congesprin Imuran rimadil  Advil Cope Indocin Robaxisal  Alka-Seltzer Effervescent Pain Reliever and Antacid Coricidin or Coricidin-D  Indomethacin Rufen  Alka-Seltzer plus Cold Medicine Cosprin Ketoprofen S-A-C Tablets  Anacin Analgesic Tablets or Capsules Coumadin Korlgesic Salflex  Anacin Extra Strength Analgesic tablets or capsules  CP-2 Tablets Lanoril Salicylate  Anaprox Cuprimine Capsules Levenox Salocol  Anexsia-D Dalteparin Magan Salsalate  Anodynos Darvon compound Magnesium Salicylate Sine-off  Ansaid Dasin Capsules Magsal Sodium Salicylate  Anturane Depen Capsules Marnal Soma  APF Arthritis pain formula Dewitt's Pills Measurin Stanback  Argesic Dia-Gesic Meclofenamic Sulfinpyrazone  Arthritis Bayer Timed Release Aspirin Diclofenac Meclomen Sulindac  Arthritis pain formula Anacin Dicumarol Medipren Supac  Analgesic (Safety coated) Arthralgen Diffunasal Mefanamic Suprofen  Arthritis Strength Bufferin Dihydrocodeine Mepro Compound Suprol  Arthropan liquid Dopirydamole Methcarbomol with Aspirin Synalgos  ASA tablets/Enseals Disalcid Micrainin Tagament  Ascriptin Doan's Midol Talwin  Ascriptin A/D Dolene Mobidin Tanderil  Ascriptin Extra Strength Dolobid Moblgesic Ticlid  Ascriptin with Codeine Doloprin or Doloprin with Codeine Momentum Tolectin  Asperbuf Duoprin Mono-gesic Trendar  Aspergum Duradyne Motrin or Motrin IB Triminicin  Aspirin plain, buffered or enteric coated Durasal Myochrisine Trigesic  Aspirin Suppositories Easprin Nalfon Trillsate  Aspirin with Codeine Ecotrin Regular or Extra Strength Naprosyn Uracel  Atromid-S Efficin Naproxen Ursinus  Auranofin Capsules Elmiron Neocylate Vanquish  Axotal Emagrin Norgesic Verin  Azathioprine Empirin or Empirin with Codeine Normiflo Vitamin E  Azolid Emprazil Nuprin Voltaren  Bayer Aspirin plain, buffered or children's or timed BC Tablets or powders Encaprin Orgaran Warfarin Sodium  Buff-a-Comp Enoxaparin Orudis Zorpin  Buff-a-Comp with Codeine Equegesic Os-Cal-Gesic   Buffaprin Excedrin plain, buffered or Extra Strength Oxalid   Bufferin Arthritis Strength Feldene Oxphenbutazone   Bufferin plain or Extra Strength Feldene Capsules Oxycodone with Aspirin   Bufferin with Codeine Fenoprofen Fenoprofen Pabalate or Pabalate-SF   Buffets II Flogesic Panagesic    Buffinol plain or Extra Strength Florinal or Florinal with Codeine Panwarfarin   Buf-Tabs Flurbiprofen Penicillamine   Butalbital Compound Four-way cold tablets Penicillin   Butazolidin Fragmin Pepto-Bismol   Carbenicillin Geminisyn Percodan   Carna Arthritis Reliever Geopen Persantine   Carprofen Gold's salt Persistin   Chloramphenicol Goody's Phenylbutazone   Chloromycetin Haltrain Piroxlcam   Clmetidine heparin Plaquenil   Cllnoril Hyco-pap Ponstel   Clofibrate Hydroxy chloroquine Propoxyphen         Before stopping any of these medications, be sure to consult the physician who ordered them.  Some, such as Coumadin (Warfarin) are ordered to prevent or treat serious conditions such as "deep thrombosis", "pumonary embolisms", and other heart problems.  The amount of time that you may need off of the medication may also vary with the medication and the reason for which you were taking it.  If you are taking any of these medications, please make sure you notify your pain physician before you undergo any procedures.

## 2018-07-23 ENCOUNTER — Ambulatory Visit (HOSPITAL_BASED_OUTPATIENT_CLINIC_OR_DEPARTMENT_OTHER): Payer: Medicare Other | Admitting: Student in an Organized Health Care Education/Training Program

## 2018-07-23 ENCOUNTER — Encounter: Payer: Self-pay | Admitting: Student in an Organized Health Care Education/Training Program

## 2018-07-23 ENCOUNTER — Ambulatory Visit
Admission: RE | Admit: 2018-07-23 | Discharge: 2018-07-23 | Disposition: A | Discharge status: Home / Self Care | Payer: Medicare Other | Source: Ambulatory Visit | Attending: Student in an Organized Health Care Education/Training Program | Admitting: Student in an Organized Health Care Education/Training Program

## 2018-07-23 VITALS — BP 118/65 | HR 89 | Temp 98.7°F | Resp 19 | Ht 61.0 in | Wt 227.0 lb

## 2018-07-23 DIAGNOSIS — M5416 Radiculopathy, lumbar region: Secondary | ICD-10-CM | POA: Diagnosis not present

## 2018-07-23 MED ORDER — SODIUM CHLORIDE 0.9% FLUSH
2.0000 mL | Freq: Once | INTRAVENOUS | Status: AC
  Administered 2018-07-23: 2 mL

## 2018-07-23 MED ORDER — SODIUM CHLORIDE (PF) 0.9 % IJ SOLN
INTRAMUSCULAR | Status: AC
  Filled 2018-07-23: qty 10

## 2018-07-23 MED ORDER — ROPIVACAINE HCL 2 MG/ML IJ SOLN
INTRAMUSCULAR | Status: AC
  Filled 2018-07-23: qty 10

## 2018-07-23 MED ORDER — ROPIVACAINE HCL 2 MG/ML IJ SOLN
2.0000 mL | Freq: Once | INTRAMUSCULAR | Status: AC
  Administered 2018-07-23: 2 mL via EPIDURAL

## 2018-07-23 MED ORDER — LIDOCAINE HCL 2 % IJ SOLN
10.0000 mL | Freq: Once | INTRAMUSCULAR | Status: AC
  Administered 2018-07-23: 400 mg

## 2018-07-23 MED ORDER — LIDOCAINE HCL 2 % IJ SOLN
INTRAMUSCULAR | Status: AC
  Filled 2018-07-23: qty 20

## 2018-07-23 MED ORDER — IOPAMIDOL (ISOVUE-M 200) INJECTION 41%
10.0000 mL | Freq: Once | INTRAMUSCULAR | Status: AC
  Administered 2018-07-23: 10 mL via EPIDURAL

## 2018-07-23 MED ORDER — DEXAMETHASONE SODIUM PHOSPHATE 10 MG/ML IJ SOLN
INTRAMUSCULAR | Status: AC
  Filled 2018-07-23: qty 1

## 2018-07-23 MED ORDER — DEXAMETHASONE SODIUM PHOSPHATE 10 MG/ML IJ SOLN
10.0000 mg | Freq: Once | INTRAMUSCULAR | Status: AC
  Administered 2018-07-23: 10 mg

## 2018-07-23 NOTE — Progress Notes (Signed)
Safety precautions to be maintained throughout the outpatient stay will include: orient to surroundings, keep bed in low position, maintain call bell within reach at all times, provide assistance with transfer out of bed and ambulation.  

## 2018-07-23 NOTE — Progress Notes (Signed)
Patient's Name: Mark Terry.  MRN: [id number]  Referring Provider: Birdie Sons, MD  DOB: Dec 28, 1934  PCP: Birdie Sons, MD  DOS: 07/23/2018  Note by: Gillis Santa, MD  Service setting: Ambulatory outpatient  Specialty: Interventional Pain Management  Patient type: Established  Location: [facility] (AMB) Pain Management Facility  Visit type: Interventional Procedure   Primary Reason for Visit: Interventional Pain Management Treatment. CC: low back pain with radiation to L>R leg  Procedure:          Anesthesia, Analgesia, Anxiolysis:  Type: Therapeutic Inter-Laminar Epidural Steroid Injection  #3  Region: Lumbar Level: L5-S1 Level. Laterality: Left-Sided         Type: Local Anesthesia Indication(s): Analgesia         Route: Infiltration ([facility]/IM) IV Access: Declined Sedation: Declined  Local Anesthetic: Lidocaine 2%  Position: Prone with head of the table was raised to facilitate breathing.   Indications: 1. Lumbar radiculopathy    Pain Score: Pre-procedure: 8 /10 Post-procedure: 8 /10  Pre-op Assessment:  Mark Terry is a 84 y.o. (year old), male patient, seen today for interventional treatment. He  has a past surgical history that includes Cholecystectomy (1970); Replacement total knee bilateral ('86 and 2000); Total hip arthroplasty (Right, 2012); Neck surgery; Coronary artery bypass graft (2000); Colonoscopy; bilateral cataract surgery; Reverse shoulder arthroplasty (05/30/2012); Back surgery; Ankle Fusion (Right, 11/04/2015); prostate laser surgery; Coronary angioplasty; Joint replacement; and Esophagogastroduodenoscopy (egd) with propofol (N/A, 11/18/2017). Mark Terry has a current medication list which includes the following prescription(s): aspirin, accu-chek guide, doxazosin, fluticasone, furosemide, gabapentin, glimepiride, glucose blood, hydrocodone-acetaminophen, ibuprofen, jardiance, loratadine, meloxicam, metformin, metoprolol tartrate, mirtazapine, multivitamin with  minerals, omeprazole, oxybutynin, potassium chloride sa, simvastatin, and trazodone. His primarily concern today is the Back Pain (left leg)  Initial Vital Signs:  Pulse/HCG Rate: 89ECG Heart Rate: 87 Temp: 98.7 F (37.1 C) Resp: 16 BP: 136/62 SpO2: 98 %  BMI: Estimated body mass index is 42.89 kg/m as calculated from the following:   Height as of this encounter: 5\' 1"  (1.549 m).   Weight as of this encounter: 227 lb (103 kg).  Risk Assessment: Allergies: Reviewed. He is allergic to propoxyphene; codeine sulfate; darvon [propoxyphene hcl]; demerol [meperidine]; and oxycodone.  Allergy Precautions: None required Coagulopathies: Reviewed. None identified.  Blood-thinner therapy: None at this time Active Infection(s): Reviewed. None identified. Mark Terry is afebrile  Site Confirmation: Mark Terry was asked to confirm the procedure and laterality before marking the site Procedure checklist: Completed Consent: Before the procedure and under the influence of no sedative(s), amnesic(s), or anxiolytics, the patient was informed of the treatment options, risks and possible complications. To fulfill our ethical and legal obligations, as recommended by the American Medical Association's Code of Ethics, I have informed the patient of my clinical impression; the nature and purpose of the treatment or procedure; the risks, benefits, and possible complications of the intervention; the alternatives, including doing nothing; the risk(s) and benefit(s) of the alternative treatment(s) or procedure(s); and the risk(s) and benefit(s) of doing nothing. The patient was provided information about the general risks and possible complications associated with the procedure. These may include, but are not limited to: failure to achieve desired goals, infection, bleeding, organ or nerve damage, allergic reactions, paralysis, and death. In addition, the patient was informed of those risks and complications associated  to Spine-related procedures, such as failure to decrease pain; infection (i.e.: Meningitis, epidural or intraspinal abscess); bleeding (i.e.: epidural hematoma, subarachnoid hemorrhage, or any other type of  intraspinal or peri-dural bleeding); organ or nerve damage (i.e.: Any type of peripheral nerve, nerve root, or spinal cord injury) with subsequent damage to sensory, motor, and/or autonomic systems, resulting in permanent pain, numbness, and/or weakness of one or several areas of the body; allergic reactions; (i.e.: anaphylactic reaction); and/or death. Furthermore, the patient was informed of those risks and complications associated with the medications. These include, but are not limited to: allergic reactions (i.e.: anaphylactic or anaphylactoid reaction(s)); adrenal axis suppression; blood sugar elevation that in diabetics may result in ketoacidosis or comma; water retention that in patients with history of congestive heart failure may result in shortness of breath, pulmonary edema, and decompensation with resultant heart failure; weight gain; swelling or edema; medication-induced neural toxicity; particulate matter embolism and blood vessel occlusion with resultant organ, and/or nervous system infarction; and/or aseptic necrosis of one or more joints. Finally, the patient was informed that Medicine is not an exact science; therefore, there is also the possibility of unforeseen or unpredictable risks and/or possible complications that may result in a catastrophic outcome. The patient indicated having understood very clearly. We have given the patient no guarantees and we have made no promises. Enough time was given to the patient to ask questions, all of which were answered to the patient's satisfaction. Mark Terry has indicated that he wanted to continue with the procedure. Attestation: I, the ordering provider, attest that I have discussed with the patient the benefits, risks, side-effects, alternatives,  likelihood of achieving goals, and potential problems during recovery for the procedure that I have provided informed consent. Date  Time: 07/23/2018  9:21 AM  Pre-Procedure Preparation:  Monitoring: As per clinic protocol. Respiration, ETCO2, SpO2, BP, heart rate and rhythm monitor placed and checked for adequate function Safety Precautions: Patient was assessed for positional comfort and pressure points before starting the procedure. Time-out: I initiated and conducted the "Time-out" before starting the procedure, as per protocol. The patient was asked to participate by confirming the accuracy of the "Time Out" information. Verification of the correct person, site, and procedure were performed and confirmed by me, the nursing staff, and the patient. "Time-out" conducted as per Joint Commission's Universal Protocol (UP.01.01.01). Time: 1022  Description of Procedure:          Target Area: The interlaminar space, initially targeting the lower laminar border of the superior vertebral body. Approach: Paramedial approach. Area Prepped: Entire Posterior Lumbar Region Prepping solution: ChloraPrep (2% chlorhexidine gluconate and 70% isopropyl alcohol) Safety Precautions: Aspiration looking for blood return was conducted prior to all injections. At no point did we inject any substances, as a needle was being advanced. No attempts were made at seeking any paresthesias. Safe injection practices and needle disposal techniques used. Medications properly checked for expiration dates. SDV (single dose vial) medications used. Description of the Procedure: Protocol guidelines were followed. The procedure needle was introduced through the skin, ipsilateral to the reported pain, and advanced to the target area. Bone was contacted and the needle walked caudad, until the lamina was cleared. The epidural space was identified using "loss-of-resistance technique" with 2-3 ml of PF-NaCl (0.9% NSS), in a 5cc LOR glass  syringe.  Vitals:   07/23/18 1010 07/23/18 1022 07/23/18 1028 07/23/18 1032  BP: 122/74 113/79 125/70 118/65  Pulse:      Resp: 18 13 16 19   Temp:      TempSrc:      SpO2: 95% 95% 96% 95%  Weight:      Height:  Start Time: 1022 hrs. End Time: 1032 hrs.  Materials:  Needle(s) Type: Epidural needle Gauge: 17G Length: 5-in Medication(s): Please see orders for medications and dosing details. 8 cc solution made of 5 cc of preservative-free saline,2 cc of 0.2% ropivacaine, 1 cc of Decadron 10 mg/cc Imaging Guidance (Spinal):          Type of Imaging Technique: Fluoroscopy Guidance (Spinal) Indication(s): Assistance in needle guidance and placement for procedures requiring needle placement in or near specific anatomical locations not easily accessible without such assistance. Exposure Time: Please see nurses notes. Contrast: Before injecting any contrast, we confirmed that the patient did not have an allergy to iodine, shellfish, or radiological contrast. Once satisfactory needle placement was completed at the desired level, radiological contrast was injected. Contrast injected under live fluoroscopy. No contrast complications. See chart for type and volume of contrast used. Fluoroscopic Guidance: I was personally present during the use of fluoroscopy. "Tunnel Vision Technique" used to obtain the best possible view of the target area. Parallax error corrected before commencing the procedure. "Direction-depth-direction" technique used to introduce the needle under continuous pulsed fluoroscopy. Once target was reached, antero-posterior, oblique, and lateral fluoroscopic projection used confirm needle placement in all planes. Images permanently stored in EMR. Interpretation: I personally interpreted the imaging intraoperatively. Adequate needle placement confirmed in multiple planes. Appropriate spread of contrast into desired area was observed. No evidence of afferent or efferent  intravascular uptake. No intrathecal or subarachnoid spread observed. Permanent images saved into the patient's record.  Antibiotic Prophylaxis:   Anti-infectives (From admission, onward)   None     Indication(s): None identified  Post-operative Assessment:  Post-procedure Vital Signs:  Pulse/HCG Rate: 8987 Temp: 98.7 F (37.1 C) Resp: 19 BP: 118/65 SpO2: 95 %  EBL: None  Complications: No immediate post-treatment complications observed by team, or reported by patient.  Note: The patient tolerated the entire procedure well. A repeat set of vitals were taken after the procedure and the patient was kept under observation following institutional policy, for this type of procedure. Post-procedural neurological assessment was performed, showing return to baseline, prior to discharge. The patient was provided with post-procedure discharge instructions, including a section on how to identify potential problems. Should any problems arise concerning this procedure, the patient was given instructions to immediately contact us, at any time, without hesitation. In any case, we plan to contact the patient by telephone for a follow-up status report regarding this interventional procedure.  Comments:  No additional relevant information.  Plan of Care    Imaging Orders     DG C-Arm 1-60 Min-No Report Procedure Orders    No procedure(s) ordered today   Medications ordered for procedure: Meds ordered this encounter  Medications  . iopamidol (ISOVUE-M) 41 % intrathecal injection 10 mL  . ropivacaine (PF) 2 mg/mL (0.2%) (NAROPIN) injection 2 mL  . sodium chloride flush (NS) 0.9 % injection 2 mL  . lidocaine (XYLOCAINE) 2 % (with pres) injection 200 mg  . dexamethasone (DECADRON) injection 10 mg   Medications administered: We administered iopamidol, ropivacaine (PF) 2 mg/mL (0.2%), sodium chloride flush, lidocaine, and dexamethasone.  See the medical record for exact dosing, route, and time  of administration.  Disposition: Discharge home  Discharge Date & Time: 07/23/2018; 1045 hrs.   Physician-requested Follow-up: Return in about 5 weeks (around 08/27/2018) for Post Procedure Evaluation.  Future Appointments  Date Time Provider [place]  08/14/2018  9:30 AM Gillis Santa, MD [facility] None  08/26/2018  8:45 AM  Gillis Santa, MD [facility] None  09/17/2018  8:20 AM Birdie Sons, MD [facility] None  07/14/2019  9:00 AM Ernestine Conrad, Marlowe Kays None   Primary Care Physician: Birdie Sons, MD Location: [facility] Out[facility] Management Facility Note by: Gillis Santa, MD Date: 07/23/2018; Time: 11:01 AM  Disclaimer:  Medicine is not an exact science. The only guarantee in medicine is that nothing is guaranteed. It is important to note that the decision to proceed with this intervention was based on the information collected from the patient. The Data and conclusions were drawn from the patient's questionnaire, the interview, and the physical examination. Because the information was provided in large part by the patient, it cannot be guaranteed that it has not been purposely or unconsciously manipulated. Every effort has been made to obtain as much relevant data as possible for this evaluation. It is important to note that the conclusions that lead to this procedure are derived in large part from the available data. Always take into account that the treatment will also be dependent on availability of resources and existing treatment guidelines, considered by other Pain Management Practitioners as being common knowledge and practice, at the time of the intervention. For Medico-Legal purposes, it is also important to point out that variation in procedural techniques and pharmacological choices are the acceptable norm. The indications, contraindications, technique, and results of the above procedure should only be interpreted and judged by a Board-Certified Interventional Pain  Specialist with extensive familiarity and expertise in the same exact procedure and technique.

## 2018-07-24 ENCOUNTER — Telehealth: Payer: Self-pay | Admitting: *Deleted

## 2018-07-24 ENCOUNTER — Telehealth: Payer: Self-pay

## 2018-07-24 NOTE — Telephone Encounter (Signed)
Attempted to reach patient for post procedure call.  No answer and no voicemail capability

## 2018-07-24 NOTE — Telephone Encounter (Signed)
Patient called saying someone called him to ask how he was doing after procedure and he said he  is doing alright.

## 2018-07-30 ENCOUNTER — Telehealth: Payer: Self-pay | Admitting: Family Medicine

## 2018-07-30 NOTE — Telephone Encounter (Signed)
Pt needing lancets and test strips for his Accu Check meter. It needs to be called into his mail order pharmacy: [facility], [place] [phone] (Phone) [phone] (Fax)   Pt states he is out of lancets and test strips currently.  Will need an emergency supply.  Please advise.  Thanks, [employer]

## 2018-08-01 MED ORDER — ACCU-CHEK SOFT TOUCH LANCETS MISC: 4 refills | Status: DC

## 2018-08-01 MED ORDER — GLUCOSE BLOOD VI STRP: ORAL_STRIP | 4 refills | Status: DC

## 2018-08-04 ENCOUNTER — Other Ambulatory Visit: Payer: Self-pay | Admitting: Family Medicine

## 2018-08-08 ENCOUNTER — Telehealth: Payer: Self-pay | Admitting: Family Medicine

## 2018-08-08 MED ORDER — ACCU-CHEK FASTCLIX LANCET KIT: PACK | 0 refills | Status: DC

## 2018-08-08 NOTE — Telephone Encounter (Signed)
Patient has messed up his Acucheck Fast Click lancing device.   He needs another one called into [facility].   He needs the lancing device only.

## 2018-08-12 ENCOUNTER — Encounter: Payer: Medicare Other | Admitting: Student in an Organized Health Care Education/Training Program

## 2018-08-14 ENCOUNTER — Encounter: Payer: Medicare Other | Admitting: Student in an Organized Health Care Education/Training Program

## 2018-08-26 ENCOUNTER — Other Ambulatory Visit: Payer: Self-pay

## 2018-08-26 ENCOUNTER — Encounter: Payer: Self-pay | Admitting: Student in an Organized Health Care Education/Training Program

## 2018-08-26 ENCOUNTER — Ambulatory Visit
Payer: Medicare Other | Attending: Student in an Organized Health Care Education/Training Program | Admitting: Student in an Organized Health Care Education/Training Program

## 2018-08-26 VITALS — BP 137/60 | HR 81 | Temp 97.6°F | Resp 18 | Ht 61.0 in | Wt 227.0 lb

## 2018-08-26 DIAGNOSIS — G894 Chronic pain syndrome: Secondary | ICD-10-CM | POA: Insufficient documentation

## 2018-08-26 DIAGNOSIS — M48061 Spinal stenosis, lumbar region without neurogenic claudication: Secondary | ICD-10-CM | POA: Diagnosis not present

## 2018-08-26 DIAGNOSIS — M5416 Radiculopathy, lumbar region: Secondary | ICD-10-CM | POA: Insufficient documentation

## 2018-08-26 MED ORDER — HYDROCODONE-ACETAMINOPHEN 5-325 MG PO TABS: 1.0000 | ORAL_TABLET | Freq: Two times a day (BID) | ORAL | 0 refills | Status: DC | PRN

## 2018-08-26 MED ORDER — HYDROCODONE-ACETAMINOPHEN 5-325 MG PO TABS: 1.0000 | ORAL_TABLET | Freq: Two times a day (BID) | ORAL | 0 refills | Status: AC | PRN

## 2018-08-26 NOTE — Progress Notes (Signed)
Nursing Pain Medication Assessment:  Safety precautions to be maintained throughout the outpatient stay will include: orient to surroundings, keep bed in low position, maintain call bell within reach at all times, provide assistance with transfer out of bed and ambulation.  Medication Inspection Compliance: Mark Terry did not comply with our request to bring his pills to be counted. He was reminded that bringing the medication bottles, even when empty, is a requirement.  Medication: None brought in. Pill/Patch Count: None available to be counted. Bottle Appearance: No container available. Did not bring bottle(s) to appointment. Filled Date: N/A Last Medication intake:  Today  Pt reminded to bring med bottles to each appt.

## 2018-08-26 NOTE — Patient Instructions (Signed)
2 Rx for Hydrocodone with acetaminophen to last until 10/31/2018 has been escribed to your pharmacy.

## 2018-08-26 NOTE — Progress Notes (Signed)
Patient's Name: Mark Terry.  MRN: [id number]  Referring Provider: Birdie Sons, MD  DOB: 1935/05/05  PCP: Birdie Sons, MD  DOS: 08/26/2018  Note by: Gillis Santa, MD  Service setting: Ambulatory outpatient  Specialty: Interventional Pain Management  Location: [facility] (AMB) Pain Management Facility    Patient type: Established   Primary Reason(s) for Visit: Encounter for post-procedure evaluation of chronic illness with mild to moderate exacerbation CC: Back Pain (lower)  HPI  Mark Terry is a 84 y.o. year old, male patient, who comes today for a post-procedure evaluation. He has Allergic rhinitis; Angina pectoris ([place]); Arthritis; Arthropathia; At risk for falling; Back pain with radiation; BPH (benign prostatic hyperplasia); CAD (coronary artery disease); Carpal tunnel syndrome; Cervical radiculitis; Cervical spinal stenosis; CHF (congestive heart failure) ([place]); Chronic gastritis; DDD (degenerative disc disease), cervical; Diabetes mellitus with nephropathy ([place]); Essential (primary) hypertension; Fatty infiltration of liver; GERD (gastroesophageal reflux disease); History of colonic polyps; Hypercholesterolemia; Decreased potassium in the blood; Neuritis or radiculitis due to rupture of lumbar intervertebral disc; Morbid obesity ([place]); Cervical pain; Pilonidal cyst; Skin lesion; OSA on CPAP; Change in blood platelet count; Insomnia; Arthritis of ankle, right; Varus foot deformity, acquired; Arthritis of ankle or foot, degenerative; Urge incontinence; Nocturia; Urinary frequency; DDD (degenerative disc disease), lumbar; Primary osteoarthritis of left hip; Status post total replacement of right hip; Lumbar spondylosis; Dysphagia; Chronic kidney disease (CKD), active medical management without dialysis, stage 3 (moderate) ([place]); Chronic pain syndrome; Lumbar radiculopathy; and History of lumbar laminectomy for spinal cord decompression (L2/3) on their problem list. His primarily concern today is  the Back Pain (lower)  Pain Assessment: Location: Lower, Right, Left Back Radiating: down both legs to feet, left is worse Onset: More than a month ago Duration: Chronic pain Quality: Constant, Discomfort, Dull, Sharp Severity: 3 /10 (subjective, self-reported pain score)  Note: Reported level is compatible with observation.                         When using our objective Pain Scale, levels between 6 and 10/10 are said to belong in an emergency room, as it progressively worsens from a 6/10, described as severely limiting, requiring emergency care not usually available at an outpatient pain management facility. At a 6/10 level, communication becomes difficult and requires great effort. Assistance to reach the emergency department may be required. Facial flushing and profuse sweating along with potentially dangerous increases in heart rate and blood pressure will be evident. Effect on ADL: cannot stand long enough to shave, painful to lay flat Timing: Constant Modifying factors: meds, sitting, procedure BP: 137/60  HR: 81  Mark Terry comes in today for post-procedure evaluation.  Further details on both, my assessment(s), as well as the proposed treatment plan, please see below.  Post-Procedure Assessment  07/23/2018 Procedure: Left L5-S1 ESI #3 Pre-procedure pain score:  8/10 Post-procedure pain score: 8/10         Influential Factors: BMI: 42.89 kg/m Intra-procedural challenges: None observed.         Assessment challenges: None detected.              Reported side-effects: None.        Post-procedural adverse reactions or complications: None reported         Sedation: Please see nurses note. When no sedatives are used, the analgesic levels obtained are directly associated to the effectiveness of the local anesthetics. However, when sedation is provided, the level of analgesia  obtained during the initial 1 hour following the intervention, is believed to be the result of a combination  of factors. These factors may include, but are not limited to: 1. The effectiveness of the local anesthetics used. 2. The effects of the analgesic(s) and/or anxiolytic(s) used. 3. The degree of discomfort experienced by the patient at the time of the procedure. 4. The patients ability and reliability in recalling and recording the events. 5. The presence and influence of possible secondary gains and/or psychosocial factors. Reported result: Relief experienced during the 1st hour after the procedure: 100 % (Ultra-Short Term Relief)            Interpretative annotation: Clinically appropriate result. Analgesia during this period is likely to be Local Anesthetic and/or IV Sedative (Analgesic/Anxiolytic) related.          Effects of local anesthetic: The analgesic effects attained during this period are directly associated to the localized infiltration of local anesthetics and therefore cary significant diagnostic value as to the etiological location, or anatomical origin, of the pain. Expected duration of relief is directly dependent on the pharmacodynamics of the local anesthetic used. Long-acting (4-6 hours) anesthetics used.  Reported result: Relief during the next 4 to 6 hour after the procedure: 100 % (Short-Term Relief)            Interpretative annotation: Clinically appropriate result. Analgesia during this period is likely to be Local Anesthetic-related.          Long-term benefit: Defined as the period of time past the expected duration of local anesthetics (1 hour for short-acting and 4-6 hours for long-acting). With the possible exception of prolonged sympathetic blockade from the local anesthetics, benefits during this period are typically attributed to, or associated with, other factors such as analgesic sensory neuropraxia, antiinflammatory effects, or beneficial biochemical changes provided by agents other than the local anesthetics.  Reported result: Extended relief following procedure:  100 %(X 1 month; pain has returned, but pt states comfort level is 10% better than before procedure.) (Long-Term Relief)            Interpretative annotation: Clinically possible results. Good relief. No permanent benefit expected. Inflammation plays a part in the etiology to the pain.          Current benefits: Defined as reported results that persistent at this point in time.   Analgesia: 50-75 %            Function: Somewhat improved ROM: Somewhat improved Interpretative annotation: Recurrence of symptoms. No permanent benefit expected. Effective diagnostic intervention.          Interpretation: Results would suggest a successful diagnostic intervention.                  Plan:  Please see "Plan of Care" for details.                Controlled Substance Pharmacotherapy Assessment REMS (Risk Evaluation and Mitigation Strategy)  Analgesic:HC 5 mg BID MME/day: 10 mg/day.  Rise Patience, RN  08/26/2018  8:33 AM  Sign when Signing Visit Nursing Pain Medication Assessment:  Safety precautions to be maintained throughout the outpatient stay will include: orient to surroundings, keep bed in low position, maintain call bell within reach at all times, provide assistance with transfer out of bed and ambulation.  Medication Inspection Compliance: Mark Terry did not comply with our request to bring his pills to be counted. He was reminded that bringing the medication bottles, even when empty, is a  requirement.  Medication: None brought in. Pill/Patch Count: None available to be counted. Bottle Appearance: No container available. Did not bring bottle(s) to appointment. Filled Date: N/A Last Medication intake:  Today  Pt reminded to bring med bottles to each appt.   Pharmacokinetics: Liberation and absorption (onset of action): WNL Distribution (time to peak effect): WNL Metabolism and excretion (duration of action): WNL         Pharmacodynamics: Desired effects: Analgesia: Mark Terry reports  >50% benefit. Functional ability: Patient reports that medication allows him to accomplish basic ADLs Clinically meaningful improvement in function (CMIF): Sustained CMIF goals met Perceived effectiveness: Described as relatively effective, allowing for increase in activities of daily living (ADL) Undesirable effects: Side-effects or Adverse reactions: None reported Monitoring: [place] PMP: Online review of the past 28-monthperiod conducted. Compliant with practice rules and regulations Last UDS on record: Summary  Date Value Ref Range Status  05/05/2018 FINAL  Final    Comment:    ==================================================================== TOXASSURE COMP DRUG ANALYSIS,UR ==================================================================== Test                             Result       Flag       Units Drug Present and Declared for Prescription Verification   Gabapentin                     PRESENT      EXPECTED   Trazodone                      PRESENT      EXPECTED   1,3 chlorophenyl piperazine    PRESENT      EXPECTED    1,3-chlorophenyl piperazine is an expected metabolite of    trazodone.   Metoprolol                     PRESENT      EXPECTED Drug Present not Declared for Prescription Verification   Naproxen                       PRESENT      UNEXPECTED Drug Absent but Declared for Prescription Verification   Hydrocodone                    Not Detected UNEXPECTED ng/mg creat   Mirtazapine                    Not Detected UNEXPECTED   Acetaminophen                  Not Detected UNEXPECTED    Acetaminophen, as indicated in the declared medication list, is    not always detected even when used as directed.   Salicylate                     Not Detected UNEXPECTED    Aspirin, as indicated in the declared medication list, is not    always detected even when used as directed.   Ibuprofen                      Not Detected UNEXPECTED    Ibuprofen, as indicated in the declared  medication list, is not    always detected even when used as directed. ==================================================================== Test  Result    Flag   Units      Ref Range   Creatinine              105              mg/dL      >=20 ==================================================================== Declared Medications:  The flagging and interpretation on this report are based on the  following declared medications.  Unexpected results may arise from  inaccuracies in the declared medications.  **Note: The testing scope of this panel includes these medications:  Gabapentin  Hydrocodone (Hydrocodone-Acetaminophen)  Metoprolol  Mirtazapine  Trazodone  **Note: The testing scope of this panel does not include small to  moderate amounts of these reported medications:  Acetaminophen (Hydrocodone-Acetaminophen)  Aspirin (Aspirin 81)  Ibuprofen  **Note: The testing scope of this panel does not include following  reported medications:  Doxazosin  Empagliflozin  Fluticasone  Furosemide  Glimepiride  Loratadine  Meloxicam  Metformin  Mirabegron  Multivitamin (MVI)  Omeprazole  Oxybutynin  Potassium  Simvastatin ==================================================================== For clinical consultation, please call [phone]. ====================================================================    UDS interpretation: Compliant          Medication Assessment Form: Reviewed. Patient indicates being compliant with therapy Treatment compliance: Compliant Risk Assessment Profile: Aberrant behavior: See initial evaluations. None observed or detected today Comorbid factors increasing risk of overdose: See initial evaluation. No additional risks detected today Opioid risk tool (ORT):  Opioid Risk  08/26/2018  Alcohol 0  Illegal Drugs 0  Rx Drugs 0  Alcohol 0  Illegal Drugs 0  Rx Drugs 0  Age between 16-45 years  0  History of Preadolescent  Sexual Abuse 0  Psychological Disease 0  Depression 0  Opioid Risk Tool Scoring 0  Opioid Risk Interpretation Low Risk    ORT Scoring interpretation table:  Score <3 = Low Risk for SUD  Score between 4-7 = Moderate Risk for SUD  Score >8 = High Risk for Opioid Abuse   Risk of substance use disorder (SUD): Low  Risk Mitigation Strategies:  Patient Counseling: Covered Patient-Prescriber Agreement (PPA): Present and active  Notification to other healthcare providers: Done  Pharmacologic Plan: No change in therapy, at this time.              Laboratory Chemistry  Inflammation Markers (CRP: Acute Phase) (ESR: Chronic Phase) No results found for: CRP, ESRSEDRATE, LATICACIDVEN                       Rheumatology Markers No results found for: RF, ANA, LABURIC, URICUR, LYMEIGGIGMAB, LYMEABIGMQN, HLAB27                      Renal Function Markers Lab Results  Component Value Date   BUN 26 05/16/2018   CREATININE 1.70 (H) 05/16/2018   BCR 15 05/16/2018   GFRAA 43 (L) 05/16/2018   GFRNONAA 37 (L) 05/16/2018                             Hepatic Function Markers Lab Results  Component Value Date   AST 32 05/16/2018   ALT 36 05/16/2018   ALBUMIN 4.3 05/16/2018   ALKPHOS 53 05/16/2018                        Electrolytes Lab Results  Component Value Date   NA 141 05/16/2018   K 4.4  05/16/2018   CL 101 05/16/2018   CALCIUM 9.7 05/16/2018   PHOS 3.0 05/13/2017                        Neuropathy Markers Lab Results  Component Value Date   VITAMINB12 728 10/10/2015   HGBA1C 7.7 (H) 05/16/2018                        CNS Tests No results found for: COLORCSF, APPEARCSF, RBCCOUNTCSF, WBCCSF, POLYSCSF, LYMPHSCSF, EOSCSF, PROTEINCSF, GLUCCSF, JCVIRUS, CSFOLI, IGGCSF                      Bone Pathology Markers Lab Results  Component Value Date   VD25OH 47.6 10/10/2015                         Coagulation Parameters Lab Results  Component Value Date   INR 0.99 05/27/2012    LABPROT 13.0 05/27/2012   APTT 31 05/27/2012   PLT 98 (LL) 05/16/2018   LABHEMA Note: 05/16/2018                        Cardiovascular Markers Lab Results  Component Value Date   TROPONINI <0.03 11/05/2015   HGB 12.4 (L) 05/16/2018   HCT 37.0 (L) 05/16/2018                         CA Markers No results found for: CEA, CA125, LABCA2                      Endocrine Markers Lab Results  Component Value Date   TSH 4.330 09/03/2016                        Note: Lab results reviewed.  Recent Diagnostic Imaging Results  DG C-Arm 1-60 Min-No Report Fluoroscopy was utilized by the requesting physician.  No radiographic  interpretation.   Complexity Note: Imaging results reviewed. Results shared with Mark Terry, using Layman's terms.                         Meds   Current Outpatient Medications:  .  aspirin 81 MG tablet, Take 81 mg by mouth daily., Disp: , Rfl:  .  Blood Glucose Monitoring Suppl (ACCU-CHEK GUIDE) w/Device KIT, USE AS DIRECTED DAILY, Disp: 1 kit, Rfl: 0 .  doxazosin (CARDURA) 2 MG tablet, TAKE 1 TABLET BY MOUTH  DAILY, Disp: 90 tablet, Rfl: 4 .  fluticasone (FLONASE) 50 MCG/ACT nasal spray, USE 1 TO 2 SPRAYS IN EACH  NOSTRIL EVERY DAY, Disp: 48 g, Rfl: 5 .  furosemide (LASIX) 40 MG tablet, TAKE 1 TABLET BY MOUTH  DAILY, Disp: 90 tablet, Rfl: 4 .  gabapentin (NEURONTIN) 300 MG capsule, TAKE 1 CAPSULE BY MOUTH TWO TIMES DAILY, Disp: 180 capsule, Rfl: 3 .  glimepiride (AMARYL) 1 MG tablet, TAKE 1 TABLET BY MOUTH  DAILY, Disp: 90 tablet, Rfl: 4 .  glucose blood (ACCU-CHEK GUIDE) test strip, Use to check blood sugar once daily for type 2 diabetes, e11.9, Disp: 100 each, Rfl: 4 .  [START ON 09/01/2018] HYDROcodone-acetaminophen (NORCO/VICODIN) 5-325 MG tablet, Take 1 tablet by mouth 2 (two) times daily as needed for up to 30 days for moderate pain., Disp: 60 tablet, Rfl: 0 .  ibuprofen (ADVIL,MOTRIN) 200 MG tablet, Take 200 mg by mouth every 6 (six) hours as needed.,  Disp: , Rfl:  .  JARDIANCE 25 MG TABS tablet, TAKE 1 TABLET BY MOUTH  DAILY, Disp: 90 tablet, Rfl: 4 .  Lancets (ACCU-CHEK SOFT TOUCH) lancets, Use to check blood sugar daily for type 2 diabetes E11.9, Disp: 100 each, Rfl: 4 .  Lancets Misc. (ACCU-CHEK FASTCLIX LANCET) KIT, Used to check glucose.  DX E11.9, Disp: 1 kit, Rfl: 0 .  loratadine (CLARITIN) 10 MG tablet, Take 10 mg by mouth daily., Disp: , Rfl:  .  meloxicam (MOBIC) 15 MG tablet, Take 1 tablet (15 mg total) by mouth daily as needed., Disp: 90 tablet, Rfl: 3 .  metFORMIN (GLUCOPHAGE-XR) 500 MG 24 hr tablet, Take 1 tablet (500 mg total) by mouth daily., Disp: 3 tablet, Rfl: 0 .  metoprolol tartrate (LOPRESSOR) 25 MG tablet, TAKE 1 TABLET BY MOUTH TWO  TIMES DAILY, Disp: 180 tablet, Rfl: 4 .  mirtazapine (REMERON) 7.5 MG tablet, Take 7.5 mg by mouth at bedtime., Disp: , Rfl:  .  Multiple Vitamin (MULTIVITAMIN WITH MINERALS) TABS, Take 1 tablet by mouth daily., Disp: , Rfl:  .  omeprazole (PRILOSEC) 40 MG capsule, Take 1 capsule (40 mg total) by mouth daily., Disp: 90 capsule, Rfl: 3 .  oxybutynin (DITROPAN XL) 15 MG 24 hr tablet, TAKE 1 TABLET BY MOUTH AT  BEDTIME, Disp: 90 tablet, Rfl: 3 .  potassium chloride SA (K-DUR,KLOR-CON) 20 MEQ tablet, TAKE 1 TABLET BY MOUTH  DAILY, Disp: 90 tablet, Rfl: 3 .  simvastatin (ZOCOR) 40 MG tablet, TAKE 1 TABLET BY MOUTH AT  BEDTIME, Disp: 90 tablet, Rfl: 4 .  traZODone (DESYREL) 100 MG tablet, TAKE 0.5-1 TABLET BY MOUTH  AT BEDTIME AS NEEDED FOR  SLEEP., Disp: 90 tablet, Rfl: 4 .  [START ON 10/01/2018] HYDROcodone-acetaminophen (NORCO/VICODIN) 5-325 MG tablet, Take 1 tablet by mouth 2 (two) times daily as needed for up to 30 days for moderate pain., Disp: 60 tablet, Rfl: 0  ROS  Constitutional: Denies any fever or chills Gastrointestinal: No reported hemesis, hematochezia, vomiting, or acute GI distress Musculoskeletal: Denies any acute onset joint swelling, redness, loss of ROM, or  weakness Neurological: No reported episodes of acute onset apraxia, aphasia, dysarthria, agnosia, amnesia, paralysis, loss of coordination, or loss of consciousness  Allergies  Mark Terry is allergic to propoxyphene; codeine sulfate; darvon [propoxyphene hcl]; demerol [meperidine]; and oxycodone.  [place]  Drug: Mark Terry  reports no history of drug use. Alcohol:  reports no history of alcohol use. Tobacco:  reports that he quit smoking about 50 years ago. His smoking use included cigarettes. He has a 25.00 pack-year smoking history. He has never used smokeless tobacco. Medical:  has a past medical history of Anemia, Bruises easily, Diabetes mellitus without complication ([place]), H/O esophagogastroduodenoscopy, H/O hiatal hernia, History of blood transfusion, History of bronchitis, History of gastric ulcer, History of MRSA infection, Leg cramps, Melanoma ([place]), Panic attacks, Pneumonia, and PONV (postoperative nausea and vomiting). Surgical: Mark Terry  has a past surgical history that includes Cholecystectomy (1970); Replacement total knee bilateral ('86 and 2000); Total hip arthroplasty (Right, 2012); Neck surgery; Coronary artery bypass graft (2000); Colonoscopy; bilateral cataract surgery; Reverse shoulder arthroplasty (05/30/2012); Back surgery; Ankle Fusion (Right, 11/04/2015); prostate laser surgery; Coronary angioplasty; Joint replacement; and Esophagogastroduodenoscopy (egd) with propofol (N/A, 11/18/2017). Family: family history includes Alzheimer's disease in his mother; Bipolar disorder in his brother; Heart attack in his father.  Constitutional Exam  General appearance: Well nourished, well developed, and well hydrated. In no apparent acute distress Vitals:   08/26/18 0823  BP: 137/60  Pulse: 81  Resp: 18  Temp: 97.6 F (36.4 C)  TempSrc: Oral  SpO2: 99%  Weight: 227 lb (103 kg)  Height: _0  (1.549 m)   BMI Assessment: Estimated body mass index is 42.89 kg/m as calculated from  the following:   Height as of this encounter: _1  (1.549 m).   Weight as of this encounter: 227 lb (103 kg).  BMI interpretation table: BMI level Category Range association with higher incidence of chronic pain  <18 kg/m2 Underweight   18.5-24.9 kg/m2 Ideal body weight   25-29.9 kg/m2 Overweight Increased incidence by 20%  30-34.9 kg/m2 Obese (Class I) Increased incidence by 68%  35-39.9 kg/m2 Severe obesity (Class II) Increased incidence by 136%  >40 kg/m2 Extreme obesity (Class III) Increased incidence by 254%   Patient's current BMI Ideal Body weight  Body mass index is 42.89 kg/m. Ideal body weight: 52.3 kg (115 lb 4.8 oz) Adjusted ideal body weight: 72.6 kg (159 lb 15.7 oz)   BMI Readings from Last 4 Encounters:  08/26/18 42.89 kg/m  07/23/18 42.89 kg/m  07/15/18 42.51 kg/m  07/10/18 43.82 kg/m   Wt Readings from Last 4 Encounters:  08/26/18 227 lb (103 kg)  07/23/18 227 lb (103 kg)  07/15/18 225 lb (102.1 kg)  07/10/18 231 lb 14.4 oz (105.2 kg)  Psych/Mental status: Alert, oriented x 3 (person, place, & time)       Eyes: PERLA Respiratory: No evidence of acute respiratory distress  Cervical Spine Area Exam  Skin & Axial Inspection: No masses, redness, edema, swelling, or associated skin lesions Alignment: Symmetrical Functional ROM: Unrestricted ROM      Stability: No instability detected Muscle Tone/Strength: Functionally intact. No obvious neuro-muscular anomalies detected. Sensory (Neurological): Unimpaired Palpation: No palpable anomalies              Upper Extremity (UE) Exam    Side: Right upper extremity  Side: Left upper extremity  Skin & Extremity Inspection: Skin color, temperature, and hair growth are WNL. No peripheral edema or cyanosis. No masses, redness, swelling, asymmetry, or associated skin lesions. No contractures.  Skin & Extremity Inspection: Skin color, temperature, and hair growth are WNL. No peripheral edema or cyanosis. No masses,  redness, swelling, asymmetry, or associated skin lesions. No contractures.  Functional ROM: Unrestricted ROM          Functional ROM: Unrestricted ROM          Muscle Tone/Strength: Functionally intact. No obvious neuro-muscular anomalies detected.  Muscle Tone/Strength: Functionally intact. No obvious neuro-muscular anomalies detected.  Sensory (Neurological): Unimpaired          Sensory (Neurological): Unimpaired          Palpation: No palpable anomalies              Palpation: No palpable anomalies              Provocative Test(s):  Phalen's test: deferred Tinel's test: deferred Apley's scratch test (touch opposite shoulder):  Action 1 (Across chest): deferred Action 2 (Overhead): deferred Action 3 (LB reach): deferred   Provocative Test(s):  Phalen's test: deferred Tinel's test: deferred Apley's scratch test (touch opposite shoulder):  Action 1 (Across chest): deferred Action 2 (Overhead): deferred Action 3 (LB reach): deferred    Thoracic Spine Area Exam  Skin & Axial Inspection: No masses, redness, or swelling Alignment: Symmetrical  Functional ROM: Unrestricted ROM Stability: No instability detected Muscle Tone/Strength: Functionally intact. No obvious neuro-muscular anomalies detected. Sensory (Neurological): Unimpaired Muscle strength & Tone: No palpable anomalies  Lumbar Spine Area Exam  Skin & Axial Inspection: No masses, redness, or swelling Alignment: Symmetrical Functional ROM: Decreased ROM affecting both sides Stability: No instability detected Muscle Tone/Strength: Functionally intact. No obvious neuro-muscular anomalies detected. Sensory (Neurological): Musculoskeletal pain pattern Palpation: No palpable anomalies       Provocative Tests: Hyperextension/rotation test: (+) due to pain. Lumbar quadrant test (Kemp's test): (+) due to pain. Lateral bending test: deferred today       Patrick's Maneuver: deferred today                   FABER* test: deferred  today                   S-I anterior distraction/compression test: deferred today         S-I lateral compression test: deferred today         S-I Thigh-thrust test: deferred today         S-I Gaenslen's test: deferred today         *(Flexion, ABduction and External Rotation)  Gait & Posture Assessment  Ambulation: Limited Gait: Significantly limited. Dependent on assistive device to ambulate Posture: Difficulty standing up straight, due to pain   Lower Extremity Exam    Side: Right lower extremity  Side: Left lower extremity  Stability: No instability observed          Stability: No instability observed          Skin & Extremity Inspection: Skin color, temperature, and hair growth are WNL. No peripheral edema or cyanosis. No masses, redness, swelling, asymmetry, or associated skin lesions. No contractures.  Skin & Extremity Inspection: Skin color, temperature, and hair growth are WNL. No peripheral edema or cyanosis. No masses, redness, swelling, asymmetry, or associated skin lesions. No contractures.  Functional ROM: Decreased ROM for hip and knee joints          Functional ROM: Decreased ROM for hip and knee joints          Muscle Tone/Strength: Functionally intact. No obvious neuro-muscular anomalies detected.  Muscle Tone/Strength: Functionally intact. No obvious neuro-muscular anomalies detected.  Sensory (Neurological): Dermatomal pain pattern        Sensory (Neurological): Dermatomal pain pattern        DTR: Patellar: 0: absent Achilles: deferred today Plantar: deferred today  DTR: Patellar: 0: absent Achilles: deferred today Plantar: deferred today  Palpation: No palpable anomalies  Palpation: No palpable anomalies   Assessment   Status Diagnosis  Controlled Controlled Controlled 1. Chronic pain syndrome   2. Lumbar radiculopathy   3. Lumbar foraminal stenosis      Patient follows up status post left L5-S1 epidural steroid injection #3.  Patient states that this  epidural has provided him with the greatest extent of pain relief.  He states that he is sleeping better and that he is able to walk for a slightly longer period of time without having heaviness and pain in his legs.  He is starting to notice his pain return in his lower left extremity.  Patient is also here for medication refill.  [place] PMP checked and appropriate.  1. Med refill as below 2.  Continue gabapentin as prescribed. 3.  Consideration of repeat lumbar epidural steroid injection at next visit.  Plan of Care  Pharmacotherapy (Medications Ordered): Meds  ordered this encounter  Medications  . HYDROcodone-acetaminophen (NORCO/VICODIN) 5-325 MG tablet    Sig: Take 1 tablet by mouth 2 (two) times daily as needed for up to 30 days for moderate pain.    Dispense:  60 tablet    Refill:  0  . HYDROcodone-acetaminophen (NORCO/VICODIN) 5-325 MG tablet    Sig: Take 1 tablet by mouth 2 (two) times daily as needed for up to 30 days for moderate pain.    Dispense:  60 tablet    Refill:  0    Do not place this medication, or any other prescription from our practice, on "Automatic Refill". Patient may have prescription filled one day early if pharmacy is closed on scheduled refill date.   Lab-work, procedure(s), and/or referral(s): Orders Placed This Encounter  Procedures  . ToxASSURE Select 13 (MW), Urine    Considering:   Repeat left L5-S1 ESI   PRN Procedures:   To be determined at a later time   Provider-requested follow-up: Return in about 9 weeks (around 10/28/2018) for Medication Management.  Future Appointments  Date Time Provider [place]  09/17/2018  8:20 AM Birdie Sons, MD [facility] None  07/14/2019  9:00 AM Ernestine Conrad, Marlowe Kays None    Primary Care Physician: Birdie Sons, MD Location: [facility] Note by: Gillis Santa, M.D Date: 08/26/2018; Time: 8:56 AM  Patient Instructions  2 Rx for Hydrocodone with  acetaminophen to last until 10/31/2018 has been escribed to your pharmacy.

## 2018-08-31 LAB — TOXASSURE SELECT 13 (MW), URINE

## 2018-09-13 ENCOUNTER — Other Ambulatory Visit: Payer: Self-pay | Admitting: Family Medicine

## 2018-09-13 DIAGNOSIS — E1122 Type 2 diabetes mellitus with diabetic chronic kidney disease: Secondary | ICD-10-CM

## 2018-09-17 ENCOUNTER — Ambulatory Visit (INDEPENDENT_AMBULATORY_CARE_PROVIDER_SITE_OTHER): Payer: Medicare Other | Admitting: Family Medicine

## 2018-09-17 ENCOUNTER — Encounter: Payer: Self-pay | Admitting: Family Medicine

## 2018-09-17 DIAGNOSIS — I5032 Chronic diastolic (congestive) heart failure: Secondary | ICD-10-CM

## 2018-09-17 DIAGNOSIS — I10 Essential (primary) hypertension: Secondary | ICD-10-CM

## 2018-09-17 DIAGNOSIS — E1121 Type 2 diabetes mellitus with diabetic nephropathy: Secondary | ICD-10-CM

## 2018-09-17 DIAGNOSIS — N3941 Urge incontinence: Secondary | ICD-10-CM | POA: Diagnosis not present

## 2018-09-17 LAB — POCT GLYCOSYLATED HEMOGLOBIN (HGB A1C)
ESTIMATED AVERAGE GLUCOSE: 183
Hemoglobin A1C: 8 % — AB (ref 4.0–5.6)

## 2018-09-17 MED ORDER — OXYBUTYNIN CHLORIDE 5 MG PO TABS: 5.0000 mg | ORAL_TABLET | Freq: Two times a day (BID) | ORAL | 1 refills | Status: DC

## 2018-09-17 NOTE — Progress Notes (Signed)
Patient: Mark Terry. Male    DOB: [birth year]   84 y.o.   MRN: [id number] Visit Date: 09/17/2018  Today's Provider: [name], MD   Chief Complaint  Patient presents with  . Diabetes  . Hyperlipidemia  . Hypertension   Subjective:     HPI  Diabetes Mellitus Type II, Follow-up:   Lab Results  Component Value Date   HGBA1C 8.0 (A) 09/17/2018   HGBA1C 7.7 (H) 05/16/2018   HGBA1C 8.1 (A) 01/13/2018   Last seen for diabetes 4 months ago.  Management since then includes no changes. He reports good compliance with treatment. He is not having side effects.  Current symptoms include none and have been stable. Home blood sugar records: fasting range: 113-140 Has had several steroid shots for back which runs up sugar.  Episodes of hypoglycemia? no   Current Insulin Regimen: none Most Recent Eye Exam: <1 year ago Weight trend: increasing steadily Prior visit with dietician: no Current diet: in general, an "unhealthy" diet Current exercise: stationary equipment  ------------------------------------------------------------------------   Hypertension, follow-up:  BP Readings from Last 3 Encounters:  09/17/18 140/70  08/26/18 137/60  07/23/18 118/65    He was last seen for hypertension 4 months ago.  BP at that visit was 122/68. Management since that visit includes no changes.He reports good compliance with treatment. He is not having side effects.  He is exercising. He is not adherent to low salt diet.   Outside blood pressures are 120/70. He is experiencing none.  Patient denies chest pain, chest pressure/discomfort, claudication, dyspnea, exertional chest pressure/discomfort, fatigue, irregular heart beat, near-syncope, orthopnea, palpitations, paroxysmal nocturnal dyspnea, syncope and tachypnea.   Cardiovascular risk factors include advanced age (older than 65 for men, 21 for women), diabetes mellitus, dyslipidemia, hypertension and male gender.    Use of agents associated with hypertension: NSAIDS.   ------------------------------------------------------------------------    Lipid/Cholesterol, Follow-up:   Last seen for this 4 months ago.  Management since that visit includes no changes.  Last Lipid Panel:    Component Value Date/Time   CHOL 146 05/16/2018 0845   TRIG 235 (H) 05/16/2018 0845   HDL 42 05/16/2018 0845   CHOLHDL 3.5 05/16/2018 0845   LDLCALC 57 05/16/2018 0845    He reports good compliance with treatment. He is not having side effects.   Wt Readings from Last 3 Encounters:  09/17/18 235 lb (106.6 kg)  08/26/18 227 lb (103 kg)  07/23/18 227 lb (103 kg)    ------------------------------------------------------------------------  Allergies  Allergen Reactions  . Propoxyphene Anaphylaxis  . Codeine Sulfate     Patient denies  . Darvon [Propoxyphene Hcl] Other (See Comments)    Short of breath  . Demerol [Meperidine] Other (See Comments)    Short of breath  . Oxycodone Nausea Only     Current Outpatient Medications:  .  aspirin 81 MG tablet, Take 81 mg by mouth daily., Disp: , Rfl:  .  Blood Glucose Monitoring Suppl (ACCU-CHEK GUIDE) w/Device KIT, USE AS DIRECTED DAILY, Disp: 1 kit, Rfl: 0 .  doxazosin (CARDURA) 2 MG tablet, TAKE 1 TABLET BY MOUTH  DAILY, Disp: 90 tablet, Rfl: 4 .  fluticasone (FLONASE) 50 MCG/ACT nasal spray, USE 1 TO 2 SPRAYS IN EACH  NOSTRIL EVERY DAY, Disp: 48 g, Rfl: 5 .  furosemide (LASIX) 40 MG tablet, TAKE 1 TABLET BY MOUTH  DAILY, Disp: 90 tablet, Rfl: 4 .  gabapentin (NEURONTIN) 300 MG capsule, TAKE 1 CAPSULE  BY MOUTH TWO TIMES DAILY, Disp: 180 capsule, Rfl: 3 .  glimepiride (AMARYL) 1 MG tablet, TAKE 1 TABLET BY MOUTH  DAILY, Disp: 90 tablet, Rfl: 4 .  glucose blood (ACCU-CHEK GUIDE) test strip, Use to check blood sugar once daily for type 2 diabetes, e11.9, Disp: 100 each, Rfl: 4 .  HYDROcodone-acetaminophen (NORCO/VICODIN) 5-325 MG tablet, Take 1 tablet by mouth 2  (two) times daily as needed for up to 30 days for moderate pain., Disp: 60 tablet, Rfl: 0 .  [START ON 10/01/2018] HYDROcodone-acetaminophen (NORCO/VICODIN) 5-325 MG tablet, Take 1 tablet by mouth 2 (two) times daily as needed for up to 30 days for moderate pain., Disp: 60 tablet, Rfl: 0 .  ibuprofen (ADVIL,MOTRIN) 200 MG tablet, Take 200 mg by mouth every 6 (six) hours as needed., Disp: , Rfl:  .  JARDIANCE 25 MG TABS tablet, TAKE 1 TABLET BY MOUTH  DAILY, Disp: 90 tablet, Rfl: 4 .  Lancets (ACCU-CHEK SOFT TOUCH) lancets, Use to check blood sugar daily for type 2 diabetes E11.9, Disp: 100 each, Rfl: 4 .  Lancets Misc. (ACCU-CHEK FASTCLIX LANCET) KIT, Used to check glucose.  DX E11.9, Disp: 1 kit, Rfl: 0 .  loratadine (CLARITIN) 10 MG tablet, Take 10 mg by mouth daily., Disp: , Rfl:  .  meloxicam (MOBIC) 15 MG tablet, Take 1 tablet (15 mg total) by mouth daily as needed., Disp: 90 tablet, Rfl: 3 .  metFORMIN (GLUCOPHAGE-XR) 500 MG 24 hr tablet, TAKE 1 TABLET 2 TIMES DAILY BY MOUTH., Disp: 180 tablet, Rfl: 4 .  metoprolol tartrate (LOPRESSOR) 25 MG tablet, TAKE 1 TABLET BY MOUTH TWO  TIMES DAILY, Disp: 180 tablet, Rfl: 4 .  mirtazapine (REMERON) 7.5 MG tablet, Take 7.5 mg by mouth at bedtime., Disp: , Rfl:  .  Multiple Vitamin (MULTIVITAMIN WITH MINERALS) TABS, Take 1 tablet by mouth daily., Disp: , Rfl:  .  omeprazole (PRILOSEC) 40 MG capsule, Take 1 capsule (40 mg total) by mouth daily., Disp: 90 capsule, Rfl: 3 .  potassium chloride SA (K-DUR,KLOR-CON) 20 MEQ tablet, TAKE 1 TABLET BY MOUTH  DAILY, Disp: 90 tablet, Rfl: 3 .  simvastatin (ZOCOR) 40 MG tablet, TAKE 1 TABLET BY MOUTH AT  BEDTIME, Disp: 90 tablet, Rfl: 4 .  traZODone (DESYREL) 100 MG tablet, TAKE 0.5-1 TABLET BY MOUTH  AT BEDTIME AS NEEDED FOR  SLEEP., Disp: 90 tablet, Rfl: 4 .  oxybutynin (DITROPAN XL) 15 MG 24 hr tablet, TAKE 1 TABLET BY MOUTH AT  BEDTIME (Patient not taking: Reported on 09/17/2018), Disp: 90 tablet, Rfl: 3  Review of  Systems  Constitutional: Negative for appetite change, chills and fever.  Respiratory: Negative for chest tightness, shortness of breath and wheezing.   Cardiovascular: Positive for leg swelling (in right leg; unchanged from baseline). Negative for chest pain and palpitations.  Gastrointestinal: Negative for abdominal pain, nausea and vomiting.  Neurological: Positive for numbness (in hands).    Social History   Tobacco Use  . Smoking status: Former Smoker    Packs/day: 1.00    Years: 25.00    Pack years: 25.00    Types: Cigarettes    Last attempt to quit: 07/09/1968    Years since quitting: 50.2  . Smokeless tobacco: Never Used  . Tobacco comment: quit at age 67  Substance Use Topics  . Alcohol use: No    Alcohol/week: 0.0 standard drinks      Objective:   BP 140/70 (BP Location: Left Arm, Patient Position: Sitting, Cuff Size:  Large)   Pulse 74   Temp 98.6 F (37 C) (Oral)   Resp 16   Ht _0  (1.549 m)   Wt 235 lb (106.6 kg)   SpO2 95% Comment: room air  BMI 44.40 kg/m     Physical Exam   General Appearance:    Alert, cooperative, no distress, obese  Eyes:    PERRL, conjunctiva/corneas clear, EOM's intact       Lungs:     Clear to auscultation bilaterally, respirations unlabored  Heart:    Regular rate and rhythm  Neurologic:   Awake, alert, oriented x 3. No apparent focal neurological           defect.       Results for orders placed or performed in visit on 09/17/18  POCT HgB A1C  Result Value Ref Range   Hemoglobin A1C 8.0 (A) 4.0 - 5.6 %   HbA1c POC (<> result, manual entry)     HbA1c, POC (prediabetic range)     HbA1c, POC (controlled diabetic range)     Est. average glucose Bld gHb Est-mCnc 183        Assessment & Plan    1. Morbid obesity ([place]) Encouraged continue regular exercise and reduced calorie diet.   2. Chronic diastolic congestive heart failure (HCC) Asymptomatic. Compliant with medication.  Continue aggressive risk factor  modification.  He states he has follow up scheduled with Dr.Callwood.   3. Urge incontinence He states he stopped ditropan XL due to expense and would like to try less expensive formulation.  - oxybutynin (DITROPAN) 5 MG tablet; Take 1 tablet (5 mg total) by mouth 2 (two) times daily.  Dispense: 180 tablet; Refill: 1  4. Diabetes mellitus with nephropathy (HCC) Stable. Continue current medications.   - POCT HgB A1C  5. Essential (primary) hypertension Stable Continue current medications.    Follow up 3-4 months.      [name], MD  [facility]

## 2018-09-17 NOTE — Patient Instructions (Signed)
.   Please review the attached list of medications and notify my office if there are any errors.   . Please bring all of your medications to every appointment so we can make sure that our medication list is the same as yours.   

## 2018-09-18 ENCOUNTER — Other Ambulatory Visit: Payer: Self-pay

## 2018-09-18 MED ORDER — ACCU-CHEK SOFT TOUCH LANCETS MISC: 4 refills | Status: DC

## 2018-09-18 NOTE — Telephone Encounter (Signed)
Patient is requesting refill on Lancets be sent to [facility]. KW

## 2018-09-20 ENCOUNTER — Other Ambulatory Visit: Payer: Self-pay | Admitting: Family Medicine

## 2018-09-20 DIAGNOSIS — M5116 Intervertebral disc disorders with radiculopathy, lumbar region: Secondary | ICD-10-CM

## 2018-10-21 DIAGNOSIS — R600 Localized edema: Secondary | ICD-10-CM | POA: Diagnosis not present

## 2018-10-21 DIAGNOSIS — R0602 Shortness of breath: Secondary | ICD-10-CM | POA: Diagnosis not present

## 2018-10-21 DIAGNOSIS — I208 Other forms of angina pectoris: Secondary | ICD-10-CM | POA: Diagnosis not present

## 2018-10-21 DIAGNOSIS — I10 Essential (primary) hypertension: Secondary | ICD-10-CM | POA: Diagnosis not present

## 2018-10-21 DIAGNOSIS — I25119 Atherosclerotic heart disease of native coronary artery with unspecified angina pectoris: Secondary | ICD-10-CM | POA: Diagnosis not present

## 2018-10-28 ENCOUNTER — Ambulatory Visit
Payer: Medicare Other | Attending: Student in an Organized Health Care Education/Training Program | Admitting: Student in an Organized Health Care Education/Training Program

## 2018-10-28 ENCOUNTER — Other Ambulatory Visit: Payer: Self-pay

## 2018-10-28 ENCOUNTER — Telehealth: Payer: Self-pay | Admitting: *Deleted

## 2018-10-28 DIAGNOSIS — G894 Chronic pain syndrome: Secondary | ICD-10-CM

## 2018-10-28 DIAGNOSIS — Z9889 Other specified postprocedural states: Secondary | ICD-10-CM

## 2018-10-28 DIAGNOSIS — M4802 Spinal stenosis, cervical region: Secondary | ICD-10-CM

## 2018-10-28 DIAGNOSIS — M5136 Other intervertebral disc degeneration, lumbar region: Secondary | ICD-10-CM | POA: Diagnosis not present

## 2018-10-28 DIAGNOSIS — I2510 Atherosclerotic heart disease of native coronary artery without angina pectoris: Secondary | ICD-10-CM

## 2018-10-28 DIAGNOSIS — M48062 Spinal stenosis, lumbar region with neurogenic claudication: Secondary | ICD-10-CM

## 2018-10-28 DIAGNOSIS — M48061 Spinal stenosis, lumbar region without neurogenic claudication: Secondary | ICD-10-CM | POA: Diagnosis not present

## 2018-10-28 DIAGNOSIS — M5416 Radiculopathy, lumbar region: Secondary | ICD-10-CM

## 2018-10-28 MED ORDER — HYDROCODONE-ACETAMINOPHEN 5-325 MG PO TABS: 1.0000 | ORAL_TABLET | Freq: Two times a day (BID) | ORAL | 0 refills | Status: DC | PRN

## 2018-10-28 MED ORDER — HYDROCODONE-ACETAMINOPHEN 5-325 MG PO TABS: 1.0000 | ORAL_TABLET | Freq: Two times a day (BID) | ORAL | 0 refills | Status: AC | PRN

## 2018-10-28 NOTE — Progress Notes (Signed)
Pain Management Virtual Encounter Note - Virtual Visit via Telephone Telehealth (real-time audio visits between healthcare provider and patient).  Patient's Phone No. & Preferred Pharmacy:  [phone] (home); There is no such number on file (mobile).; (Preferred) [phone] [email or web address]  [facility] [phone] [place], [place] - [place] [address] [place] [address] Phone: [phone] Fax: [place], [place] - [place] [address] Phone: [phone] Fax: [phone]   Pre-screening note:  Our staff contacted Mark Terry and offered him an "in person", "face-to-face" appointment versus a telephone encounter. He indicated preferring the telephone encounter, at this time.  Reason for Virtual Visit: COVID-19*  Social distancing based on CDC and AMA recommendations.   I contacted Mark Terry. on 10/28/2018 at 9:57 AM via telephone and clearly identified myself as Gillis Santa, MD. I verified that I was speaking with the correct person using two identifiers (Name and date of birth: 1934-08-29).  Advanced Informed Consent I sought verbal advanced consent from Mark Terry. for virtual visit interactions. I informed Mark Terry of possible security and privacy concerns, risks, and limitations associated with providing "not-in-person" medical evaluation and management services. I also informed Mark Terry of the availability of "in-person" appointments. Finally, I informed him that there would be a charge for the virtual visit and that he could be  personally, fully or partially, financially responsible for it. Mark Terry expressed understanding and agreed to proceed.   Historic Elements   Mark Terry. is a 84 y.o. year old, male patient evaluated today after his last encounter by our practice on 08/26/2018. Mark Terry  has a past medical history of Anemia, Bruises easily, Diabetes  mellitus without complication ([place]), H/O esophagogastroduodenoscopy, H/O hiatal hernia, History of blood transfusion, History of bronchitis, History of gastric ulcer, History of MRSA infection, Leg cramps, Melanoma ([place]), Panic attacks, Pneumonia, and PONV (postoperative nausea and vomiting). He also  has a past surgical history that includes Cholecystectomy (1970); Replacement total knee bilateral ('86 and 2000); Total hip arthroplasty (Right, 2012); Neck surgery; Coronary artery bypass graft (2000); Colonoscopy; bilateral cataract surgery; Reverse shoulder arthroplasty (05/30/2012); Back surgery; Ankle Fusion (Right, 11/04/2015); prostate laser surgery; Coronary angioplasty; Joint replacement; and Esophagogastroduodenoscopy (egd) with propofol (N/A, 11/18/2017). Mark Terry has a current medication list which includes the following prescription(s): aspirin, accu-chek guide, doxazosin, fluticasone, furosemide, gabapentin, glimepiride, glucose blood, hydrocodone-acetaminophen, ibuprofen, jardiance, accu-chek soft touch, accu-chek fastclix lancet, loratadine, meloxicam, metformin, metoprolol tartrate, mirtazapine, multivitamin with minerals, omeprazole, oxybutynin, potassium chloride sa, simvastatin, trazodone, hydrocodone-acetaminophen, and hydrocodone-acetaminophen. He  reports that he quit smoking about 50 years ago. His smoking use included cigarettes. He has a 25.00 pack-year smoking history. He has never used smokeless tobacco. He reports that he does not drink alcohol or use drugs. Mark Terry is allergic to propoxyphene; codeine sulfate; darvon [propoxyphene hcl]; demerol [meperidine]; and oxycodone.   HPI  I last saw him on 08/26/2018. He is being evaluated for medication management.  Patient doing well overall.  No changes in medical history.  Taking medications as prescribed.  No side effects.  Refill as below.  Pharmacotherapy Assessment   10/01/2018  2   08/26/2018  Hydrocodone-Acetamin 5-325 MG   60.00 30 Bi Lat   [id number]   Thr (4878)   0  10.00 MME  Medicare   [place]    Monitoring: Pharmacotherapy: No side-effects or adverse reactions reported. [place] PMP: PDMP  reviewed during this encounter.       Compliance: No problems identified or detected. Plan: Refer to "POC".  Review of recent tests  DG C-Arm 1-60 Min-No Report Fluoroscopy was utilized by the requesting physician.  No radiographic  interpretation.    Office Visit on 09/17/2018  Component Date Value Ref Range Status  . Hemoglobin A1C 09/17/2018 8.0* 4.0 - 5.6 % Final  . Est. average glucose Bld gHb Est-m* 09/17/2018 183   Final   Assessment  The primary encounter diagnosis was Chronic pain syndrome. Diagnoses of Lumbar radiculopathy, Lumbar foraminal stenosis, History of lumbar laminectomy for spinal cord decompression (L2/3), Lumbar degenerative disc disease, Coronary artery disease involving native coronary artery of native heart without angina pectoris, Spinal stenosis, lumbar region, with neurogenic claudication, and Cervical spinal stenosis were also pertinent to this visit.  Plan of Care  I am having Mark Terry. start on HYDROcodone-acetaminophen and HYDROcodone-acetaminophen. I am also having him maintain his aspirin, multivitamin with minerals, loratadine, omeprazole, fluticasone, metoprolol tartrate, ibuprofen, potassium chloride SA, traZODone, simvastatin, furosemide, gabapentin, glimepiride, Jardiance, doxazosin, mirtazapine, glucose blood, Accu-Chek Guide, Accu-Chek FastClix Lancet, metFORMIN, oxybutynin, accu-chek soft touch, meloxicam, and HYDROcodone-acetaminophen.  Pharmacotherapy (Medications Ordered): Meds ordered this encounter  Medications  . HYDROcodone-acetaminophen (NORCO/VICODIN) 5-325 MG tablet    Sig: Take 1 tablet by mouth 2 (two) times daily as needed for up to 30 days for moderate pain.    Dispense:  60 tablet    Refill:  0    Do not place this medication, or any other prescription from  our practice, on "Automatic Refill". Patient may have prescription filled one day early if pharmacy is closed on scheduled refill date.  Marland Kitchen HYDROcodone-acetaminophen (NORCO/VICODIN) 5-325 MG tablet    Sig: Take 1 tablet by mouth 2 (two) times daily as needed for up to 30 days for severe pain. Must last 30 days.    Dispense:  60 tablet    Refill:  0    [place] STOP ACT - Not applicable. Fill one day early if pharmacy is closed on scheduled refill date.  Marland Kitchen HYDROcodone-acetaminophen (NORCO/VICODIN) 5-325 MG tablet    Sig: Take 1 tablet by mouth 2 (two) times daily as needed for up to 30 days for severe pain. Must last 30 days.    Dispense:  60 tablet    Refill:  0    [place] STOP ACT - Not applicable. Fill one day early if pharmacy is closed on scheduled refill date.   Orders:  No orders of the defined types were placed in this encounter.  Follow-up plan:   Return in about 3 months (around 01/27/2019) for Medication Management.   I discussed the assessment and treatment plan with the patient. The patient was provided an opportunity to ask questions and all were answered. The patient agreed with the plan and demonstrated an understanding of the instructions.  Patient advised to call back or seek an in-person evaluation if the symptoms or condition worsens.  Total duration of non-face-to-face encounter: 2minutes.  Note by: Gillis Santa, MD Date: 10/28/2018; Time: 9:57 AM  Disclaimer:  * Given the special circumstances of the COVID-19 pandemic, the federal government has announced that the Office for Civil Rights (OCR) will exercise its enforcement discretion and will not impose penalties on physicians using telehealth in the event of noncompliance with regulatory requirements under the [place] and Accountability Act (HIPAA) in connection with the good faith provision of telehealth during the DVVOH-60 national public health  emergency. (AMA)

## 2018-10-30 DIAGNOSIS — I25119 Atherosclerotic heart disease of native coronary artery with unspecified angina pectoris: Secondary | ICD-10-CM | POA: Diagnosis not present

## 2018-10-30 DIAGNOSIS — R600 Localized edema: Secondary | ICD-10-CM | POA: Diagnosis not present

## 2018-10-30 DIAGNOSIS — R0602 Shortness of breath: Secondary | ICD-10-CM | POA: Diagnosis not present

## 2018-10-30 DIAGNOSIS — I208 Other forms of angina pectoris: Secondary | ICD-10-CM | POA: Diagnosis not present

## 2018-11-11 DIAGNOSIS — Z85820 Personal history of malignant melanoma of skin: Secondary | ICD-10-CM | POA: Diagnosis not present

## 2018-11-11 DIAGNOSIS — L570 Actinic keratosis: Secondary | ICD-10-CM | POA: Diagnosis not present

## 2018-11-11 DIAGNOSIS — Z872 Personal history of diseases of the skin and subcutaneous tissue: Secondary | ICD-10-CM | POA: Diagnosis not present

## 2018-11-11 DIAGNOSIS — C44212 Basal cell carcinoma of skin of right ear and external auricular canal: Secondary | ICD-10-CM | POA: Diagnosis not present

## 2018-11-11 DIAGNOSIS — D485 Neoplasm of uncertain behavior of skin: Secondary | ICD-10-CM | POA: Diagnosis not present

## 2018-11-11 DIAGNOSIS — L578 Other skin changes due to chronic exposure to nonionizing radiation: Secondary | ICD-10-CM | POA: Diagnosis not present

## 2018-11-11 DIAGNOSIS — D044 Carcinoma in situ of skin of scalp and neck: Secondary | ICD-10-CM | POA: Diagnosis not present

## 2018-11-11 DIAGNOSIS — Z85828 Personal history of other malignant neoplasm of skin: Secondary | ICD-10-CM | POA: Diagnosis not present

## 2018-12-19 ENCOUNTER — Other Ambulatory Visit: Payer: Self-pay | Admitting: Gastroenterology

## 2018-12-24 DIAGNOSIS — R6889 Other general symptoms and signs: Secondary | ICD-10-CM | POA: Diagnosis not present

## 2018-12-30 DIAGNOSIS — D0439 Carcinoma in situ of skin of other parts of face: Secondary | ICD-10-CM | POA: Diagnosis not present

## 2018-12-30 DIAGNOSIS — C44212 Basal cell carcinoma of skin of right ear and external auricular canal: Secondary | ICD-10-CM | POA: Diagnosis not present

## 2018-12-30 HISTORY — PX: MOHS SURGERY: SHX181

## 2019-01-07 DIAGNOSIS — E119 Type 2 diabetes mellitus without complications: Secondary | ICD-10-CM | POA: Diagnosis not present

## 2019-01-07 LAB — HM DIABETES EYE EXAM

## 2019-01-12 ENCOUNTER — Encounter: Payer: Self-pay | Admitting: Family Medicine

## 2019-01-19 ENCOUNTER — Other Ambulatory Visit: Payer: Self-pay

## 2019-01-19 ENCOUNTER — Encounter: Payer: Self-pay | Admitting: Family Medicine

## 2019-01-19 ENCOUNTER — Ambulatory Visit (INDEPENDENT_AMBULATORY_CARE_PROVIDER_SITE_OTHER): Payer: Medicare Other | Admitting: Family Medicine

## 2019-01-19 VITALS — BP 112/62 | HR 96 | Temp 98.2°F | Resp 16 | Wt 232.0 lb

## 2019-01-19 DIAGNOSIS — E1121 Type 2 diabetes mellitus with diabetic nephropathy: Secondary | ICD-10-CM

## 2019-01-19 DIAGNOSIS — E7800 Pure hypercholesterolemia, unspecified: Secondary | ICD-10-CM | POA: Diagnosis not present

## 2019-01-19 DIAGNOSIS — I10 Essential (primary) hypertension: Secondary | ICD-10-CM | POA: Diagnosis not present

## 2019-01-19 DIAGNOSIS — D696 Thrombocytopenia, unspecified: Secondary | ICD-10-CM

## 2019-01-19 DIAGNOSIS — R430 Anosmia: Secondary | ICD-10-CM

## 2019-01-19 DIAGNOSIS — I5032 Chronic diastolic (congestive) heart failure: Secondary | ICD-10-CM | POA: Diagnosis not present

## 2019-01-19 DIAGNOSIS — J301 Allergic rhinitis due to pollen: Secondary | ICD-10-CM

## 2019-01-19 LAB — POCT GLYCOSYLATED HEMOGLOBIN (HGB A1C): Hemoglobin A1C: 7.8 % — AB (ref 4.0–5.6)

## 2019-01-19 MED ORDER — AZELASTINE HCL 0.1 % NA SOLN: 2.0000 | Freq: Two times a day (BID) | NASAL | 1 refills | Status: DC

## 2019-01-19 NOTE — Patient Instructions (Signed)
.   Please review the attached list of medications and notify my office if there are any errors.   . Please bring all of your medications to every appointment so we can make sure that our medication list is the same as yours.   . We will have flu vaccines available after Labor Day. Please go to your pharmacy or call the office in early September to schedule you flu shot.   

## 2019-01-19 NOTE — Progress Notes (Signed)
Patient: Mark Terry. Male    DOB: [birth year]   84 y.o.   MRN: [id number] Visit Date: 01/19/2019  Today's Provider: [name], MD   No chief complaint on file.  Subjective:     HPI     Diabetes Mellitus Type II, Follow-up:   Lab Results  Component Value Date   HGBA1C 8.0 (A) 09/17/2018   HGBA1C 7.7 (H) 05/16/2018   HGBA1C 8.1 (A) 01/13/2018   Last seen for diabetes 4 months ago.  Management since then includes no changes. He reports excellent compliance with treatment. He is not having side effects.  Current symptoms include none and have been stable. Home blood sugar records: fasting range: 130-145  Episodes of hypoglycemia? no   Current Insulin Regimen: None Most Recent Eye Exam: 01/07/2019 Weight trend: stable Prior visit with dietician: no Current diet: in general, a "healthy" diet   Current exercise: at least four days a week.   ------------------------------------------------------------------------   Hypertension, follow-up:  BP Readings from Last 3 Encounters:  01/19/19 112/62  09/17/18 140/70  08/26/18 137/60    He was last seen for hypertension 4 months ago.  BP at that visit was 140/70. Management since that visit includes no changes He reports excellent compliance with treatment. He is not having side effects.  He is exercising. He is adherent to low salt diet.   Outside blood pressures are 140/70's. He is experiencing lower extremity edema.  Patient denies chest pain, dyspnea, irregular heart beat and palpitations.   Cardiovascular risk factors include advanced age (older than 45 for men, 64 for women), diabetes mellitus, dyslipidemia, hypertension, male gender and obesity (BMI >= 30 kg/m2).  Use of agents associated with hypertension: none.   ------------------------------------------------------------------------    Lipid/Cholesterol, Follow-up:   Last seen for this 6 months ago.  Management since that visit  includes no changes.  Last Lipid Panel:    Component Value Date/Time   CHOL 146 05/16/2018 0845   TRIG 235 (H) 05/16/2018 0845   HDL 42 05/16/2018 0845   CHOLHDL 3.5 05/16/2018 0845   LDLCALC 57 05/16/2018 0845    He reports excellent compliance with treatment. He is not having side effects.   Wt Readings from Last 3 Encounters:  01/19/19 232 lb (105.2 kg)  09/17/18 235 lb (106.6 kg)  08/26/18 227 lb (103 kg)    ------------------------------------------------------------------------  He also reports loss of sense of smell for 4-5 months. No new medications. Has had some allergies for which he uses fluticasone nasal spray which does not completely control sx.   Allergies  Allergen Reactions  . Propoxyphene Anaphylaxis  . Codeine Sulfate     Patient denies  . Darvon [Propoxyphene Hcl] Other (See Comments)    Short of breath  . Demerol [Meperidine] Other (See Comments)    Short of breath  . Oxycodone Nausea Only     Current Outpatient Medications:  .  aspirin 81 MG tablet, Take 81 mg by mouth daily., Disp: , Rfl:  .  Blood Glucose Monitoring Suppl (ACCU-CHEK GUIDE) w/Device KIT, USE AS DIRECTED DAILY, Disp: 1 kit, Rfl: 0 .  doxazosin (CARDURA) 2 MG tablet, TAKE 1 TABLET BY MOUTH  DAILY, Disp: 90 tablet, Rfl: 4 .  fluticasone (FLONASE) 50 MCG/ACT nasal spray, USE 1 TO 2 SPRAYS IN EACH  NOSTRIL EVERY DAY, Disp: 48 g, Rfl: 5 .  furosemide (LASIX) 40 MG tablet, TAKE 1 TABLET BY MOUTH  DAILY, Disp: 90 tablet, Rfl: 4 .  gabapentin (NEURONTIN) 300 MG capsule, TAKE 1 CAPSULE BY MOUTH TWO TIMES DAILY, Disp: 180 capsule, Rfl: 3 .  glimepiride (AMARYL) 1 MG tablet, TAKE 1 TABLET BY MOUTH  DAILY, Disp: 90 tablet, Rfl: 4 .  glucose blood (ACCU-CHEK GUIDE) test strip, Use to check blood sugar once daily for type 2 diabetes, e11.9, Disp: 100 each, Rfl: 4 .  HYDROcodone-acetaminophen (NORCO/VICODIN) 5-325 MG tablet, Take 1 tablet by mouth 2 (two) times daily as needed for up to 30 days  for severe pain. Must last 30 days., Disp: 60 tablet, Rfl: 0 .  ibuprofen (ADVIL,MOTRIN) 200 MG tablet, Take 200 mg by mouth every 6 (six) hours as needed., Disp: , Rfl:  .  JARDIANCE 25 MG TABS tablet, TAKE 1 TABLET BY MOUTH  DAILY, Disp: 90 tablet, Rfl: 4 .  Lancets (ACCU-CHEK SOFT TOUCH) lancets, Use to check blood sugar daily for type 2 diabetes E11.9, Disp: 100 each, Rfl: 4 .  Lancets Misc. (ACCU-CHEK FASTCLIX LANCET) KIT, Used to check glucose.  DX E11.9, Disp: 1 kit, Rfl: 0 .  loratadine (CLARITIN) 10 MG tablet, Take 10 mg by mouth daily., Disp: , Rfl:  .  meloxicam (MOBIC) 15 MG tablet, TAKE 1 TABLET BY MOUTH  DAILY AS NEEDED, Disp: 90 tablet, Rfl: 3 .  metFORMIN (GLUCOPHAGE-XR) 500 MG 24 hr tablet, TAKE 1 TABLET 2 TIMES DAILY BY MOUTH., Disp: 180 tablet, Rfl: 4 .  metoprolol tartrate (LOPRESSOR) 25 MG tablet, TAKE 1 TABLET BY MOUTH TWO  TIMES DAILY, Disp: 180 tablet, Rfl: 4 .  mirtazapine (REMERON) 7.5 MG tablet, Take 7.5 mg by mouth at bedtime., Disp: , Rfl:  .  Multiple Vitamin (MULTIVITAMIN WITH MINERALS) TABS, Take 1 tablet by mouth daily., Disp: , Rfl:  .  omeprazole (PRILOSEC) 40 MG capsule, Take 1 capsule (40 mg total) by mouth daily., Disp: 90 capsule, Rfl: 3 .  oxybutynin (DITROPAN) 5 MG tablet, Take 1 tablet (5 mg total) by mouth 2 (two) times daily., Disp: 180 tablet, Rfl: 1 .  potassium chloride SA (K-DUR,KLOR-CON) 20 MEQ tablet, TAKE 1 TABLET BY MOUTH  DAILY, Disp: 90 tablet, Rfl: 3 .  simvastatin (ZOCOR) 40 MG tablet, TAKE 1 TABLET BY MOUTH AT  BEDTIME, Disp: 90 tablet, Rfl: 4 .  traZODone (DESYREL) 100 MG tablet, TAKE 0.5-1 TABLET BY MOUTH  AT BEDTIME AS NEEDED FOR  SLEEP., Disp: 90 tablet, Rfl: 4  Review of Systems  Constitutional: Negative.   Respiratory: Negative.   Cardiovascular: Positive for leg swelling. Negative for chest pain and palpitations.  Gastrointestinal: Negative.   Endocrine: Negative.   Musculoskeletal: Positive for back pain. Negative for  arthralgias, gait problem, joint swelling, myalgias, neck pain and neck stiffness.  Neurological: Negative for dizziness, light-headedness and headaches.    Social History   Tobacco Use  . Smoking status: Former Smoker    Packs/day: 1.00    Years: 25.00    Pack years: 25.00    Types: Cigarettes    Quit date: 07/09/1968    Years since quitting: 50.5  . Smokeless tobacco: Never Used  . Tobacco comment: quit at age 50  Substance Use Topics  . Alcohol use: No    Alcohol/week: 0.0 standard drinks      Objective:   BP 112/62 (BP Location: Right Arm, Patient Position: Sitting, Cuff Size: Large)   Pulse 96   Temp 98.2 F (36.8 C) (Oral)   Resp 16   Wt 232 lb (105.2 kg)   BMI 43.84 kg/m  Vitals:  01/19/19 0824  BP: 112/62  Pulse: 96  Resp: 16  Temp: 98.2 F (36.8 C)  TempSrc: Oral  Weight: 232 lb (105.2 kg)     Physical Exam  General Appearance:    Alert, cooperative, no distress, obese  Eyes:    PERRL, conjunctiva/corneas clear, EOM's intact       Lungs:     Clear to auscultation bilaterally, respirations unlabored  Heart:    Regular rate and rhythm  Neurologic:   Awake, alert, oriented x 3. No apparent focal neurological           defect.        Results for orders placed or performed in visit on 01/19/19  POCT glycosylated hemoglobin (Hb A1C)  Result Value Ref Range   Hemoglobin A1C 7.8 (A) 4.0 - 5.6 %       Assessment & Plan    1. Essential (primary) hypertension Well controlled.  Continue current medications.    2. Diabetes mellitus with nephropathy (HCC) Stable. Continue current medications.    3. Hypercholesterolemia He is tolerating simvastatin well with no adverse effects.   - Comprehensive metabolic panel - Lipid panel  4. Chronic diastolic congestive heart failure (HCC) Currently asymptomatic doing well on furosemide. Continue routine follow up Dr. Clayborn Bigness.   5. Thrombocytopenia (HCC)  - CBC  6. Anosmia Likely secondary to allergic  rhinitis as below. Consider ent referral  7. Allergic rhinitis due to pollen, unspecified seasonality  - azelastine (ASTELIN) 0.1 % nasal spray; Place 2 sprays into both nostrils 2 (two) times daily. Use in each nostril as directed  Dispense: 30 mL; Refill: 1  The entirety of the information documented in the History of Present Illness, Review of Systems and Physical Exam were personally obtained by me. Portions of this information were initially documented by Ashley Royalty, CMA and reviewed by me for thoroughness and accuracy.      [name], MD  [facility]

## 2019-01-20 ENCOUNTER — Encounter: Payer: Self-pay | Admitting: Student in an Organized Health Care Education/Training Program

## 2019-01-20 ENCOUNTER — Ambulatory Visit
Payer: Medicare Other | Attending: Student in an Organized Health Care Education/Training Program | Admitting: Student in an Organized Health Care Education/Training Program

## 2019-01-20 ENCOUNTER — Telehealth: Payer: Self-pay

## 2019-01-20 DIAGNOSIS — M5416 Radiculopathy, lumbar region: Secondary | ICD-10-CM

## 2019-01-20 DIAGNOSIS — G894 Chronic pain syndrome: Secondary | ICD-10-CM

## 2019-01-20 DIAGNOSIS — M48061 Spinal stenosis, lumbar region without neurogenic claudication: Secondary | ICD-10-CM

## 2019-01-20 DIAGNOSIS — M48062 Spinal stenosis, lumbar region with neurogenic claudication: Secondary | ICD-10-CM

## 2019-01-20 DIAGNOSIS — M5136 Other intervertebral disc degeneration, lumbar region: Secondary | ICD-10-CM | POA: Diagnosis not present

## 2019-01-20 DIAGNOSIS — M542 Cervicalgia: Secondary | ICD-10-CM

## 2019-01-20 DIAGNOSIS — M5412 Radiculopathy, cervical region: Secondary | ICD-10-CM

## 2019-01-20 DIAGNOSIS — Z9889 Other specified postprocedural states: Secondary | ICD-10-CM | POA: Diagnosis not present

## 2019-01-20 DIAGNOSIS — M4802 Spinal stenosis, cervical region: Secondary | ICD-10-CM

## 2019-01-20 LAB — COMPREHENSIVE METABOLIC PANEL
ALT: 47 IU/L — ABNORMAL HIGH (ref 0–44)
AST: 40 IU/L (ref 0–40)
Albumin/Globulin Ratio: 1.8 (ref 1.2–2.2)
Albumin: 4.3 g/dL (ref 3.6–4.6)
Alkaline Phosphatase: 59 IU/L (ref 39–117)
BUN/Creatinine Ratio: 14 (ref 10–24)
BUN: 26 mg/dL (ref 8–27)
Bilirubin Total: 0.4 mg/dL (ref 0.0–1.2)
CO2: 23 mmol/L (ref 20–29)
Calcium: 9.9 mg/dL (ref 8.6–10.2)
Chloride: 104 mmol/L (ref 96–106)
Creatinine, Ser: 1.81 mg/dL — ABNORMAL HIGH (ref 0.76–1.27)
GFR calc Af Amer: 39 mL/min/{1.73_m2} — ABNORMAL LOW (ref >59)
GFR calc non Af Amer: 34 mL/min/{1.73_m2} — ABNORMAL LOW (ref >59)
Globulin, Total: 2.4 g/dL (ref 1.5–4.5)
Glucose: 178 mg/dL — ABNORMAL HIGH (ref 65–99)
Potassium: 4.8 mmol/L (ref 3.5–5.2)
Sodium: 145 mmol/L — ABNORMAL HIGH (ref 134–144)
Total Protein: 6.7 g/dL (ref 6.0–8.5)

## 2019-01-20 LAB — LIPID PANEL
Chol/HDL Ratio: 3.3 ratio (ref 0.0–5.0)
Cholesterol, Total: 134 mg/dL (ref 100–199)
HDL: 41 mg/dL (ref >39)
LDL Calculated: 50 mg/dL (ref 0–99)
Triglycerides: 217 mg/dL — ABNORMAL HIGH (ref 0–149)
VLDL Cholesterol Cal: 43 mg/dL — ABNORMAL HIGH (ref 5–40)

## 2019-01-20 LAB — CBC
Hematocrit: 38.3 % (ref 37.5–51.0)
Hemoglobin: 12.6 g/dL — ABNORMAL LOW (ref 13.0–17.7)
MCH: 32.1 pg (ref 26.6–33.0)
MCHC: 32.9 g/dL (ref 31.5–35.7)
MCV: 98 fL — ABNORMAL HIGH (ref 79–97)
Platelets: 98 10*3/uL — CL (ref 150–450)
RBC: 3.93 x10E6/uL — ABNORMAL LOW (ref 4.14–5.80)
RDW: 14 % (ref 11.6–15.4)
WBC: 5.8 10*3/uL (ref 3.4–10.8)

## 2019-01-20 MED ORDER — HYDROCODONE-ACETAMINOPHEN 5-325 MG PO TABS: 1.0000 | ORAL_TABLET | Freq: Two times a day (BID) | ORAL | 0 refills | Status: AC | PRN

## 2019-01-20 NOTE — Telephone Encounter (Signed)
Patient notified of lab results

## 2019-01-20 NOTE — Telephone Encounter (Signed)
-----   Message from Birdie Sons, MD sent at 01/20/2019 12:28 PM EDT ----- Platelets are low, but stable, rest of labs are normal. Continue current medications.  Follow up in November as scheduled.

## 2019-01-20 NOTE — Progress Notes (Signed)
Pain Management Virtual Encounter Note - Virtual Visit via Telephone Telehealth (real-time audio visits between healthcare provider and patient).   Patient's Phone No. & Preferred Pharmacy:  [phone] (home); There is no such number on file (mobile).; (Preferred) [phone] [email or web address]  [facility] [place], [place] [address] [place] [address] Phone: [phone] Fax: [phone]  [facility] [place], [place] - [place] [address] Phone: [phone] Fax: [phone]  [place], [place] [facility] [place] [address] [place] [address] [place] [address] Phone: [phone] Fax: [phone]    Pre-screening note:  Our staff contacted Mark Terry and offered him an "in person", "face-to-face" appointment versus a telephone encounter. He indicated preferring the telephone encounter, at this time.   Reason for Virtual Visit: COVID-19*  Social distancing based on CDC and AMA recommendations.   I contacted Mark Terry. on 01/20/2019 via telephone.      I clearly identified myself as Gillis Santa, MD. I verified that I was speaking with the correct person using two identifiers (Name: Mark Terry., and date of birth: 12-30-1934).  Advanced Informed Consent I sought verbal advanced consent from Mark Terry. for virtual visit interactions. I informed Mark Terry of possible security and privacy concerns, risks, and limitations associated with providing "not-in-person" medical evaluation and management services. I also informed Mark Terry of the availability of "in-person" appointments. Finally, I informed him that there would be a charge for the virtual visit and that he could be  personally, fully or partially, financially responsible for it. Mark Terry expressed understanding and agreed to proceed.   Historic Elements   Mr. Mark Terry. is a 84  y.o. year old, male patient evaluated today after his last encounter by our practice on 10/28/2018. Mark Terry  has a past medical history of Anemia, Bruises easily, H/O esophagogastroduodenoscopy, H/O hiatal hernia, History of blood transfusion, History of bronchitis, History of gastric ulcer, History of MRSA infection, Leg cramps, Melanoma ([place]), Panic attacks, Pneumonia, and PONV (postoperative nausea and vomiting). He also  has a past surgical history that includes Cholecystectomy (1970); Replacement total knee bilateral ('86 and 2000); Total hip arthroplasty (Right, 2012); Neck surgery; Coronary artery bypass graft (2000); Colonoscopy; bilateral cataract surgery; Reverse shoulder arthroplasty (05/30/2012); Back surgery; Ankle Fusion (Right, 11/04/2015); prostate laser surgery; Coronary angioplasty; Joint replacement; Esophagogastroduodenoscopy (egd) with propofol (N/A, 11/18/2017); and Mohs surgery (Left, 12/30/2018). Mark Terry has a current medication list which includes the following prescription(s): aspirin, azelastine, accu-chek guide, doxazosin, fluticasone, furosemide, gabapentin, glimepiride, glucose blood, hydrocodone-acetaminophen, hydrocodone-acetaminophen, hydrocodone-acetaminophen, ibuprofen, jardiance, accu-chek soft touch, accu-chek fastclix lancet, loratadine, meloxicam, metformin, metoprolol tartrate, mirtazapine, multivitamin with minerals, omeprazole, oxybutynin, potassium chloride sa, simvastatin, and trazodone. He  reports that he quit smoking about 50 years ago. His smoking use included cigarettes. He has a 25.00 pack-year smoking history. He has never used smokeless tobacco. He reports that he does not drink alcohol or use drugs. Mark Terry is allergic to propoxyphene; codeine sulfate; darvon [propoxyphene hcl]; demerol [meperidine]; and oxycodone.   HPI  Today, he is being contacted for medication management.  No change in medical history since last visit.  Patient's pain is at  baseline.  Patient continues multimodal pain regimen as prescribed.  States that it provides pain relief and improvement in functional status.   Pharmacotherapy Assessment   12/30/2018  2   10/28/2018  Hydrocodone-Acetamin 5-325 MG  60.00 30 Bi Lat   [id number]   Thr (4878)   0  10.00 MME  Medicare   [place]    Monitoring: Pharmacotherapy: No side-effects or adverse reactions reported. [place] PMP: PDMP reviewed during this encounter.       Compliance: No problems identified. Effectiveness: Clinically acceptable. Plan: Refer to "POC".  Pertinent Labs   SAFETY SCREENING Profile Lab Results  Component Value Date   STAPHAUREUS NEGATIVE 10/27/2015   MRSAPCR NEGATIVE 10/27/2015   Renal Function Lab Results  Component Value Date   BUN 26 01/19/2019   CREATININE 1.81 (H) 01/19/2019   BCR 14 01/19/2019   GFRAA 39 (L) 01/19/2019   GFRNONAA 34 (L) 01/19/2019   Hepatic Function Lab Results  Component Value Date   AST 40 01/19/2019   ALT 47 (H) 01/19/2019   ALBUMIN 4.3 01/19/2019   UDS Summary  Date Value Ref Range Status  08/26/2018 FINAL  Final    Comment:    ==================================================================== TOXASSURE SELECT 13 (MW) ==================================================================== Test                             Result       Flag       Units Drug Present and Declared for Prescription Verification   Hydrocodone                    390          EXPECTED   ng/mg creat   Hydromorphone                  325          EXPECTED   ng/mg creat   Norhydrocodone                 388          EXPECTED   ng/mg creat    Sources of hydrocodone include scheduled prescription    medications. Hydromorphone and norhydrocodone are expected    metabolites of hydrocodone. Hydromorphone is also available as a    scheduled prescription medication. Drug Present not Declared for Prescription Verification   Alcohol, Ethyl                 0.021        UNEXPECTED g/dL     Sources of ethyl alcohol include alcoholic beverages or as a    fermentation product of glucose; glucose is present in this    specimen.  Interpret result with caution, as the presence of    ethyl alcohol is likely due, at least in part, to fermentation of    glucose. ==================================================================== Test                      Result    Flag   Units      Ref Range   Creatinine              83               mg/dL      >=20 ==================================================================== Declared Medications:  The flagging and interpretation on this report are based on the  following declared medications.  Unexpected results may arise from  inaccuracies in the declared medications.  **Note: The testing scope of this panel includes these medications:  Hydrocodone (Hydrocodone-Acetaminophen)  **Note: The testing scope of this panel does not include following  reported  medications:  Acetaminophen (Hydrocodone-Acetaminophen)  Aspirin (Aspirin 81)  Doxazosin (Cardura)  Empagliflozin  Fluticasone  Furosemide  Gabapentin  Glimepiride  Ibuprofen  Loratadine  Meloxicam  Metformin  Metoprolol  Mirtazapine  Multivitamin  Omeprazole  Oxybutynin  Potassium  Simvastatin  Trazodone ==================================================================== For clinical consultation, please call [phone]. ====================================================================    Note: Above Lab results reviewed.  Recent imaging  DG C-Arm 1-60 Min-No Report Fluoroscopy was utilized by the requesting physician.  No radiographic  interpretation.   Assessment  The primary encounter diagnosis was Lumbar radiculopathy. Diagnoses of Lumbar foraminal stenosis, Chronic pain syndrome, History of lumbar laminectomy for spinal cord decompression (L2/3), Lumbar degenerative disc disease, Spinal stenosis, lumbar region, with neurogenic claudication, Cervical  spinal stenosis, Morbid obesity ([place]), Cervical pain, and Cervical radiculitis were also pertinent to this visit.  Plan of Care  I have changed Mark Cuff Jr.'s HYDROcodone-acetaminophen. I am also having him start on HYDROcodone-acetaminophen and HYDROcodone-acetaminophen. Additionally, I am having him maintain his aspirin, multivitamin with minerals, loratadine, omeprazole, fluticasone, metoprolol tartrate, ibuprofen, potassium chloride SA, traZODone, simvastatin, furosemide, gabapentin, glimepiride, Jardiance, doxazosin, mirtazapine, glucose blood, Accu-Chek Guide, Accu-Chek FastClix Lancet, metFORMIN, oxybutynin, accu-chek soft touch, meloxicam, and azelastine.  Pharmacotherapy (Medications Ordered): Meds ordered this encounter  Medications  . HYDROcodone-acetaminophen (NORCO/VICODIN) 5-325 MG tablet    Sig: Take 1 tablet by mouth 2 (two) times daily as needed for severe pain. Must last 30 days.    Dispense:  60 tablet    Refill:  0    [place] STOP ACT - Not applicable. Fill one day early if pharmacy is closed on scheduled refill date.  [place] HYDROcodone-acetaminophen (NORCO/VICODIN) 5-325 MG tablet    Sig: Take 1 tablet by mouth 2 (two) times daily as needed for severe pain. Must last 30 days.    Dispense:  60 tablet    Refill:  0    [place] STOP ACT - Not applicable. Fill one day early if pharmacy is closed on scheduled refill date.  Marland Kitchen HYDROcodone-acetaminophen (NORCO/VICODIN) 5-325 MG tablet    Sig: Take 1 tablet by mouth 2 (two) times daily as needed for severe pain. Must last 30 days.    Dispense:  60 tablet    Refill:  0     STOP ACT - Not applicable. Fill one day early if pharmacy is closed on scheduled refill date.   Orders:  No orders of the defined types were placed in this encounter.  Follow-up plan:   Return in about 3 months (around 04/22/2019) for Medication Management.    Recent Visits Date Type Provider Dept  10/28/18 Office Visit Gillis Santa, MD [facility]   Showing recent visits within past 90 days and meeting all other requirements   Today's Visits Date Type Provider Dept  01/20/19 Office Visit Gillis Santa, MD [facility]  Showing today's visits and meeting all other requirements   Future Appointments No visits were found meeting these conditions.  Showing future appointments within next 90 days and meeting all other requirements   I discussed the assessment and treatment plan with the patient. The patient was provided an opportunity to ask questions and all were answered. The patient agreed with the plan and demonstrated an understanding of the instructions.  Patient advised to call back or seek an in-person evaluation if the symptoms or condition worsens.  Total duration of non-face-to-face encounter: 74minutes.  Note by: Gillis Santa, MD Date: 01/20/2019; Time: 9:13 AM  Note: This dictation was prepared with Viviann Spare  dictation. Any transcriptional errors that may result from this process are unintentional.  Disclaimer:  * Given the special circumstances of the COVID-19 pandemic, the federal government has announced that the Office for Civil Rights (OCR) will exercise its enforcement discretion and will not impose penalties on physicians using telehealth in the event of noncompliance with regulatory requirements under the [place] (HIPAA) in connection with the good faith provision of telehealth during the JMEQA-83 national public health emergency. ([place])

## 2019-01-24 ENCOUNTER — Other Ambulatory Visit: Payer: Self-pay | Admitting: Family Medicine

## 2019-02-02 DIAGNOSIS — M5030 Other cervical disc degeneration, unspecified cervical region: Secondary | ICD-10-CM | POA: Diagnosis not present

## 2019-02-02 DIAGNOSIS — M5412 Radiculopathy, cervical region: Secondary | ICD-10-CM | POA: Diagnosis not present

## 2019-02-09 ENCOUNTER — Other Ambulatory Visit: Payer: Self-pay

## 2019-02-09 MED ORDER — OMEPRAZOLE 40 MG PO CPDR: 40.0000 mg | DELAYED_RELEASE_CAPSULE | Freq: Every day | ORAL | 3 refills | Status: DC

## 2019-02-14 ENCOUNTER — Other Ambulatory Visit: Payer: Self-pay | Admitting: Family Medicine

## 2019-02-28 ENCOUNTER — Other Ambulatory Visit: Payer: Self-pay | Admitting: Family Medicine

## 2019-03-18 DIAGNOSIS — R0989 Other specified symptoms and signs involving the circulatory and respiratory systems: Secondary | ICD-10-CM | POA: Diagnosis not present

## 2019-03-18 DIAGNOSIS — Z9989 Dependence on other enabling machines and devices: Secondary | ICD-10-CM | POA: Diagnosis not present

## 2019-03-18 DIAGNOSIS — G4733 Obstructive sleep apnea (adult) (pediatric): Secondary | ICD-10-CM | POA: Diagnosis not present

## 2019-03-23 DIAGNOSIS — M5416 Radiculopathy, lumbar region: Secondary | ICD-10-CM | POA: Diagnosis not present

## 2019-03-25 ENCOUNTER — Other Ambulatory Visit: Payer: Self-pay

## 2019-03-25 ENCOUNTER — Ambulatory Visit (INDEPENDENT_AMBULATORY_CARE_PROVIDER_SITE_OTHER): Payer: Medicare Other | Admitting: Family Medicine

## 2019-03-25 DIAGNOSIS — Z23 Encounter for immunization: Secondary | ICD-10-CM | POA: Diagnosis not present

## 2019-04-04 ENCOUNTER — Other Ambulatory Visit: Payer: Self-pay | Admitting: Family Medicine

## 2019-04-04 DIAGNOSIS — N3941 Urge incontinence: Secondary | ICD-10-CM

## 2019-04-06 DIAGNOSIS — C4442 Squamous cell carcinoma of skin of scalp and neck: Secondary | ICD-10-CM | POA: Diagnosis not present

## 2019-04-06 DIAGNOSIS — D485 Neoplasm of uncertain behavior of skin: Secondary | ICD-10-CM | POA: Diagnosis not present

## 2019-04-11 ENCOUNTER — Other Ambulatory Visit: Payer: Self-pay | Admitting: Family Medicine

## 2019-04-13 ENCOUNTER — Other Ambulatory Visit: Payer: Self-pay | Admitting: Family Medicine

## 2019-04-13 NOTE — Telephone Encounter (Signed)
[facility] faxed refill request for the following medications:  simvastatin (ZOCOR) 40 MG tablet    Please advise.

## 2019-04-14 MED ORDER — SIMVASTATIN 40 MG PO TABS: 40.0000 mg | ORAL_TABLET | Freq: Every day | ORAL | 3 refills | Status: DC

## 2019-04-16 DIAGNOSIS — M48062 Spinal stenosis, lumbar region with neurogenic claudication: Secondary | ICD-10-CM | POA: Diagnosis not present

## 2019-04-16 DIAGNOSIS — M5416 Radiculopathy, lumbar region: Secondary | ICD-10-CM | POA: Diagnosis not present

## 2019-04-16 DIAGNOSIS — M5136 Other intervertebral disc degeneration, lumbar region: Secondary | ICD-10-CM | POA: Diagnosis not present

## 2019-04-21 ENCOUNTER — Encounter: Payer: Medicare Other | Admitting: Student in an Organized Health Care Education/Training Program

## 2019-04-22 DIAGNOSIS — C44329 Squamous cell carcinoma of skin of other parts of face: Secondary | ICD-10-CM | POA: Diagnosis not present

## 2019-05-01 DIAGNOSIS — C44329 Squamous cell carcinoma of skin of other parts of face: Secondary | ICD-10-CM | POA: Diagnosis not present

## 2019-05-01 HISTORY — PX: SQUAMOUS CELL CARCINOMA EXCISION: SHX2433

## 2019-05-05 DIAGNOSIS — R0602 Shortness of breath: Secondary | ICD-10-CM | POA: Diagnosis not present

## 2019-05-05 DIAGNOSIS — I10 Essential (primary) hypertension: Secondary | ICD-10-CM | POA: Diagnosis not present

## 2019-05-05 DIAGNOSIS — I25119 Atherosclerotic heart disease of native coronary artery with unspecified angina pectoris: Secondary | ICD-10-CM | POA: Diagnosis not present

## 2019-05-05 DIAGNOSIS — I208 Other forms of angina pectoris: Secondary | ICD-10-CM | POA: Diagnosis not present

## 2019-05-05 DIAGNOSIS — R600 Localized edema: Secondary | ICD-10-CM | POA: Diagnosis not present

## 2019-05-12 DIAGNOSIS — C44329 Squamous cell carcinoma of skin of other parts of face: Secondary | ICD-10-CM

## 2019-05-12 HISTORY — DX: Squamous cell carcinoma of skin of other parts of face: C44.329

## 2019-05-18 ENCOUNTER — Other Ambulatory Visit: Payer: Self-pay | Admitting: Family Medicine

## 2019-05-19 DIAGNOSIS — M5412 Radiculopathy, cervical region: Secondary | ICD-10-CM | POA: Diagnosis not present

## 2019-05-19 DIAGNOSIS — M5030 Other cervical disc degeneration, unspecified cervical region: Secondary | ICD-10-CM | POA: Diagnosis not present

## 2019-05-23 ENCOUNTER — Other Ambulatory Visit: Payer: Self-pay | Admitting: Family Medicine

## 2019-05-25 ENCOUNTER — Ambulatory Visit (INDEPENDENT_AMBULATORY_CARE_PROVIDER_SITE_OTHER): Payer: Medicare Other | Admitting: Family Medicine

## 2019-05-25 ENCOUNTER — Encounter: Payer: Self-pay | Admitting: Family Medicine

## 2019-05-25 ENCOUNTER — Other Ambulatory Visit: Payer: Self-pay

## 2019-05-25 VITALS — BP 138/70 | HR 81 | Temp 97.5°F | Resp 16 | Wt 232.0 lb

## 2019-05-25 DIAGNOSIS — E1121 Type 2 diabetes mellitus with diabetic nephropathy: Secondary | ICD-10-CM | POA: Diagnosis not present

## 2019-05-25 DIAGNOSIS — L0591 Pilonidal cyst without abscess: Secondary | ICD-10-CM | POA: Diagnosis not present

## 2019-05-25 DIAGNOSIS — F329 Major depressive disorder, single episode, unspecified: Secondary | ICD-10-CM | POA: Diagnosis not present

## 2019-05-25 DIAGNOSIS — N451 Epididymitis: Secondary | ICD-10-CM | POA: Diagnosis not present

## 2019-05-25 DIAGNOSIS — F32A Depression, unspecified: Secondary | ICD-10-CM

## 2019-05-25 LAB — POCT GLYCOSYLATED HEMOGLOBIN (HGB A1C)
Est. average glucose Bld gHb Est-mCnc: 183
Hemoglobin A1C: 8 % — AB (ref 4.0–5.6)

## 2019-05-25 MED ORDER — DOXYCYCLINE HYCLATE 100 MG PO TABS: 100.0000 mg | ORAL_TABLET | Freq: Two times a day (BID) | ORAL | 0 refills | Status: DC

## 2019-05-25 MED ORDER — GLIMEPIRIDE 2 MG PO TABS: 2.0000 mg | ORAL_TABLET | Freq: Every day | ORAL | 1 refills | Status: DC

## 2019-05-25 NOTE — Patient Instructions (Addendum)
.   Please review the attached list of medications and notify my office if there are any errors.   . Please bring all of your medications to every appointment so we can make sure that our medication list is the same as yours.   . Increase trazodone to 1 full tablet every evening   . If you are still feeling depressed after 2-3 weeks then call back and we can send in a prescription for an anti depressant such as Cymbalta

## 2019-05-25 NOTE — Progress Notes (Signed)
Patient: Mark Terry. Male    DOB: Feb 12, 1935   84 y.o.   MRN: [id number] Visit Date: 05/25/2019  Today's Provider: [name], MD   Chief Complaint  Patient presents with  . Diabetes  . Hypertension   Subjective:     HPI  Diabetes Mellitus Type II, Follow-up:   Lab Results  Component Value Date   HGBA1C 7.8 (A) 01/19/2019   HGBA1C 8.0 (A) 09/17/2018   HGBA1C 7.7 (H) 05/16/2018    Last seen for diabetes 4 months ago.  Management since then includes none. He reports good compliance with treatment. He is not having side effects.  Current symptoms include none and have been stable. Home blood sugar records: fasting range: 140's  Episodes of hypoglycemia? no   Current insulin regiment: Is not on insulin Most Recent Eye Exam: 01/07/2019 Weight trend: stable Prior visit with dietician: No Current exercise: stationary strength training Current diet habits: well balanced  Pertinent Labs:    Component Value Date/Time   CHOL 134 01/19/2019 0902   TRIG 217 (H) 01/19/2019 0902   HDL 41 01/19/2019 0902   LDLCALC 50 01/19/2019 0902   CREATININE 1.81 (H) 01/19/2019 0902   CREATININE 1.70 (H) 05/13/2017 0845    Wt Readings from Last 3 Encounters:  05/25/19 232 lb (105.2 kg)  01/19/19 232 lb (105.2 kg)  09/17/18 235 lb (106.6 kg)    ------------------------------------------------------------------------  Hypertension, follow-up:  BP Readings from Last 3 Encounters:  05/25/19 138/70  01/19/19 112/62  09/17/18 140/70    He was last seen for hypertension 4 months ago.  BP at that visit was 112/62. Management since that visit includes none. He reports good compliance with treatment. He is not having side effects.  He is exercising. He is adherent to low salt diet.   Outside blood pressures are 138/70. He is experiencing none.  Patient denies chest pain, chest pressure/discomfort, claudication, dyspnea, exertional chest pressure/discomfort,  fatigue, irregular heart beat, lower extremity edema, near-syncope, orthopnea, palpitations, paroxysmal nocturnal dyspnea, syncope and tachypnea.   Cardiovascular risk factors include advanced age (older than 3 for men, 22 for women), diabetes mellitus, dyslipidemia, hypertension and male gender.  Use of agents associated with hypertension: NSAIDS.     Weight trend: stable Wt Readings from Last 3 Encounters:  05/25/19 232 lb (105.2 kg)  01/19/19 232 lb (105.2 kg)  09/17/18 235 lb (106.6 kg)    Current diet: well balanced  ------------------------------------------------------------------------  Also complains of right testicle painful when sitting down for the last 5-6 days. No fever or chills. No dysuria.   He also states he just doesn't feel like doing anything and has no motivation to do things in life he used to enjoy. He is wondering if taking higher dose of trazodone may help with this, although he states he is sleeping well.   He also states he has been having a bit of drainage from location of pilonidal cyst which he had surgery on years ago. Has been slightly more painful.   Allergies  Allergen Reactions  . Propoxyphene Anaphylaxis  . Codeine Sulfate     Patient denies  . Darvon [Propoxyphene Hcl] Other (See Comments)    Short of breath  . Demerol [Meperidine] Other (See Comments)    Short of breath  . Oxycodone Nausea Only     Current Outpatient Medications:  .  aspirin 81 MG tablet, Take 81 mg by mouth daily., Disp: , Rfl:  .  azelastine (  ASTELIN) 0.1 % nasal spray, Place 2 sprays into both nostrils 2 (two) times daily. Use in each nostril as directed, Disp: 30 mL, Rfl: 1 .  Blood Glucose Monitoring Suppl (ACCU-CHEK GUIDE) w/Device KIT, USE AS DIRECTED DAILY, Disp: 1 kit, Rfl: 0 .  doxazosin (CARDURA) 2 MG tablet, TAKE 1 TABLET BY MOUTH  DAILY, Disp: 90 tablet, Rfl: 4 .  fluticasone (FLONASE) 50 MCG/ACT nasal spray, USE 1 TO 2 SPRAYS IN EACH  NOSTRIL EVERY DAY,  Disp: 48 g, Rfl: 12 .  furosemide (LASIX) 40 MG tablet, TAKE 1 TABLET BY MOUTH  DAILY, Disp: 90 tablet, Rfl: 3 .  gabapentin (NEURONTIN) 300 MG capsule, TAKE 1 CAPSULE BY MOUTH TWO TIMES DAILY, Disp: 180 capsule, Rfl: 3 .  glimepiride (AMARYL) 1 MG tablet, TAKE 1 TABLET BY MOUTH  DAILY, Disp: 90 tablet, Rfl: 3 .  glucose blood (ACCU-CHEK GUIDE) test strip, Use to check blood sugar once daily for type 2 diabetes, e11.9, Disp: 100 each, Rfl: 4 .  ibuprofen (ADVIL,MOTRIN) 200 MG tablet, Take 200 mg by mouth every 6 (six) hours as needed., Disp: , Rfl:  .  JARDIANCE 25 MG TABS tablet, TAKE 1 TABLET BY MOUTH  DAILY, Disp: 90 tablet, Rfl: 3 .  Lancets (ACCU-CHEK SOFT TOUCH) lancets, Use to check blood sugar daily for type 2 diabetes E11.9, Disp: 100 each, Rfl: 4 .  Lancets Misc. (ACCU-CHEK FASTCLIX LANCET) KIT, Used to check glucose.  DX E11.9, Disp: 1 kit, Rfl: 0 .  loratadine (CLARITIN) 10 MG tablet, Take 10 mg by mouth daily., Disp: , Rfl:  .  meloxicam (MOBIC) 15 MG tablet, TAKE 1 TABLET BY MOUTH  DAILY AS NEEDED, Disp: 90 tablet, Rfl: 3 .  metFORMIN (GLUCOPHAGE-XR) 500 MG 24 hr tablet, TAKE 1 TABLET 2 TIMES DAILY BY MOUTH., Disp: 180 tablet, Rfl: 4 .  metoprolol tartrate (LOPRESSOR) 25 MG tablet, TAKE 1 TABLET BY MOUTH TWO  TIMES DAILY, Disp: 180 tablet, Rfl: 4 .  mirtazapine (REMERON) 7.5 MG tablet, Take 7.5 mg by mouth at bedtime., Disp: , Rfl:  .  Multiple Vitamin (MULTIVITAMIN WITH MINERALS) TABS, Take 1 tablet by mouth daily., Disp: , Rfl:  .  omeprazole (PRILOSEC) 40 MG capsule, Take 1 capsule (40 mg total) by mouth daily., Disp: 90 capsule, Rfl: 3 .  oxybutynin (DITROPAN) 5 MG tablet, TAKE 1 TABLET BY MOUTH  TWICE DAILY, Disp: 180 tablet, Rfl: 3 .  potassium chloride SA (K-DUR) 20 MEQ tablet, TAKE 1 TABLET BY MOUTH  DAILY, Disp: 90 tablet, Rfl: 3 .  simvastatin (ZOCOR) 40 MG tablet, Take 1 tablet (40 mg total) by mouth at bedtime., Disp: 90 tablet, Rfl: 3 .  traZODone (DESYREL) 100 MG  tablet, TAKE 1/2 TO 1 TABLET BY  MOUTH AT BEDTIME AS NEEDED  FOR SLEEP, Disp: 90 tablet, Rfl: 3  Review of Systems  Constitutional: Negative for appetite change, chills and fever.  Respiratory: Negative for chest tightness, shortness of breath and wheezing.   Cardiovascular: Negative for chest pain and palpitations.  Gastrointestinal: Negative for abdominal pain, nausea and vomiting.    Social History   Tobacco Use  . Smoking status: Former Smoker    Packs/day: 1.00    Years: 25.00    Pack years: 25.00    Types: Cigarettes    Quit date: 07/09/1968    Years since quitting: 50.9  . Smokeless tobacco: Never Used  . Tobacco comment: quit at age 61  Substance Use Topics  . Alcohol use: No  Alcohol/week: 0.0 standard drinks      Objective:   BP 138/70 (BP Location: Left Arm, Patient Position: Sitting, Cuff Size: Large)   Pulse 81   Temp (!) 97.5 F (36.4 C) (Temporal)   Resp 16   Wt 232 lb (105.2 kg)   SpO2 98% Comment: room air  BMI 43.84 kg/m  Vitals:   05/25/19 0817  BP: 138/70  Pulse: 81  Resp: 16  Temp: (!) 97.5 F (36.4 C)  TempSrc: Temporal  SpO2: 98%  Weight: 232 lb (105.2 kg)  Body mass index is 43.84 kg/m.   Physical Exam  General appearance: Severely obese male, cooperative and in no acute distress Head: Normocephalic, without obvious abnormality, atraumatic Respiratory: Respirations even and unlabored, normal respiratory rate MS: Scant drainage from pilonidal cyst of lower lumbar spine. No erythema or swelling.  GU: Tender right posterior testicle. No masses.   Results for orders placed or performed in visit on 05/25/19  POCT HgB A1C  Result Value Ref Range   Hemoglobin A1C 8.0 (A) 4.0 - 5.6 %   Est. average glucose Bld gHb Est-mCnc 183        Assessment & Plan    1. Diabetes mellitus with nephropathy ([place]) a1c is up a bit, increase amaryl to 58m daily.   2. Epididymitis, right Start doxycyline 100 bid, call if not greatly improved when  finished.   3. Pilonidal cyst He is to call if still draining after finishing doxycycline.   4. Depression, unspecified depression type Discussed adding antidepressant to his regiment but he does not want to add any more medications to what he is already taking. He would rather see if increase trazodone to 1022mevery night would help.    Follow up diabetes in 3-4 months.   The entirety of the information documented in the History of Present Illness, Review of Systems and Physical Exam were personally obtained by me. Portions of this information were initially documented by RoMeyer CoryCMA and reviewed by me for thoroughness and accuracy.      DoLelon HuhMD  [facility]

## 2019-06-15 ENCOUNTER — Other Ambulatory Visit: Payer: Self-pay | Admitting: Family Medicine

## 2019-06-15 DIAGNOSIS — I10 Essential (primary) hypertension: Secondary | ICD-10-CM

## 2019-06-15 MED ORDER — DOXAZOSIN MESYLATE 2 MG PO TABS: 2.0000 mg | ORAL_TABLET | Freq: Every day | ORAL | 4 refills | Status: DC

## 2019-06-15 NOTE — Telephone Encounter (Signed)
[facility] faxed New Rx request for the following medications:  Cardura Tab  Please advise.  Thanks, [employer]

## 2019-06-15 NOTE — Telephone Encounter (Signed)
L.O.V. was on 05/25/2019.

## 2019-06-18 ENCOUNTER — Telehealth: Payer: Self-pay | Admitting: Family Medicine

## 2019-06-18 DIAGNOSIS — L0591 Pilonidal cyst without abscess: Secondary | ICD-10-CM

## 2019-06-18 DIAGNOSIS — N451 Epididymitis: Secondary | ICD-10-CM

## 2019-06-18 NOTE — Telephone Encounter (Signed)
From [facility] 

## 2019-06-18 NOTE — Telephone Encounter (Signed)
Pt called and stated that he would like a refill of antibiotic. Pt did not want to discuss because he is a little embarrassed. Pt states that dr Mark Terry will know what he is talking about. Please advise

## 2019-06-19 MED ORDER — DOXYCYCLINE HYCLATE 100 MG PO TABS: 100.0000 mg | ORAL_TABLET | Freq: Two times a day (BID) | ORAL | 0 refills | Status: AC

## 2019-06-19 NOTE — Telephone Encounter (Signed)
Pt calling back to check status. Please advise

## 2019-06-19 NOTE — Addendum Note (Signed)
Addended by: Birdie Sons on: 06/19/2019 12:35 PM   Modules accepted: Orders

## 2019-06-19 NOTE — Telephone Encounter (Signed)
From [facility] 

## 2019-06-22 DIAGNOSIS — M48062 Spinal stenosis, lumbar region with neurogenic claudication: Secondary | ICD-10-CM | POA: Diagnosis not present

## 2019-06-22 DIAGNOSIS — M5136 Other intervertebral disc degeneration, lumbar region: Secondary | ICD-10-CM | POA: Diagnosis not present

## 2019-06-22 DIAGNOSIS — M4802 Spinal stenosis, cervical region: Secondary | ICD-10-CM | POA: Diagnosis not present

## 2019-06-22 DIAGNOSIS — M5416 Radiculopathy, lumbar region: Secondary | ICD-10-CM | POA: Diagnosis not present

## 2019-06-22 DIAGNOSIS — M5030 Other cervical disc degeneration, unspecified cervical region: Secondary | ICD-10-CM | POA: Diagnosis not present

## 2019-07-06 DIAGNOSIS — C44329 Squamous cell carcinoma of skin of other parts of face: Secondary | ICD-10-CM | POA: Diagnosis not present

## 2019-07-14 ENCOUNTER — Ambulatory Visit: Payer: Medicare Other | Admitting: Urology

## 2019-07-15 DIAGNOSIS — M5416 Radiculopathy, lumbar region: Secondary | ICD-10-CM | POA: Diagnosis not present

## 2019-07-15 DIAGNOSIS — M48062 Spinal stenosis, lumbar region with neurogenic claudication: Secondary | ICD-10-CM | POA: Diagnosis not present

## 2019-07-15 DIAGNOSIS — M5136 Other intervertebral disc degeneration, lumbar region: Secondary | ICD-10-CM | POA: Diagnosis not present

## 2019-08-04 ENCOUNTER — Telehealth: Payer: Self-pay | Admitting: Family Medicine

## 2019-08-04 ENCOUNTER — Other Ambulatory Visit: Payer: Self-pay

## 2019-08-04 MED ORDER — ACCU-CHEK GUIDE VI STRP: ORAL_STRIP | 0 refills | Status: DC

## 2019-08-04 NOTE — Telephone Encounter (Signed)
One mth supply sent to [facility].

## 2019-08-04 NOTE — Telephone Encounter (Signed)
Patient called in stating he is completely out of these strips and would like a 30 day supply sent to local pharmacy while he waits for mail order. Please advise.  Mark Terry, Mark Terry Phone:  [phone]  Fax:  [phone]

## 2019-08-04 NOTE — Telephone Encounter (Signed)
Patient called in stating he is completely out of these strips and would like a 30 day supply sent to local pharmacy while he waits for mail order. Please advise.  [place], [place] Phone:  [phone]  Fax:  [phone]           Documentation

## 2019-09-21 DIAGNOSIS — M48062 Spinal stenosis, lumbar region with neurogenic claudication: Secondary | ICD-10-CM | POA: Diagnosis not present

## 2019-09-21 DIAGNOSIS — M4802 Spinal stenosis, cervical region: Secondary | ICD-10-CM | POA: Diagnosis not present

## 2019-09-21 DIAGNOSIS — M5416 Radiculopathy, lumbar region: Secondary | ICD-10-CM | POA: Diagnosis not present

## 2019-09-21 DIAGNOSIS — Z79891 Long term (current) use of opiate analgesic: Secondary | ICD-10-CM | POA: Diagnosis not present

## 2019-09-21 DIAGNOSIS — M5030 Other cervical disc degeneration, unspecified cervical region: Secondary | ICD-10-CM | POA: Diagnosis not present

## 2019-09-21 DIAGNOSIS — M5136 Other intervertebral disc degeneration, lumbar region: Secondary | ICD-10-CM | POA: Diagnosis not present

## 2019-09-21 NOTE — Progress Notes (Signed)
Patient: Mark Terry.   DOB: 05-Feb-1935   85 y.o. Male  MRN: [id number] Visit Date: 09/22/2019  Today's Provider: [name], MD  Subjective:    Chief Complaint  Patient presents with  . Diabetes  . Hypertension  . Depression   HPI  Diabetes Mellitus Type II, Follow-up:   Lab Results  Component Value Date   HGBA1C 7.2 (A) 09/22/2019   HGBA1C 8.0 (A) 05/25/2019   HGBA1C 7.8 (A) 01/19/2019    Last seen for diabetes 4 months ago.  Management since then includes increased amaryl to 11m daily. He reports good compliance with treatment, but is only taking Jardiance every other day due to expense.  He is not having side effects.  Current symptoms include none and have been stable. Home blood sugar records: fasting range: 140's  Episodes of hypoglycemia? no   Current insulin regiment: Is not on insulin Most Recent Eye Exam: 01/07/2019 Weight trend: stable Prior visit with dietician: No Current exercise: cardiovascular workout on exercise equipment Current diet habits: well balanced  Pertinent Labs:    Component Value Date/Time   CHOL 134 01/19/2019 0902   TRIG 217 (H) 01/19/2019 0902   HDL 41 01/19/2019 0902   LDLCALC 50 01/19/2019 0902   CREATININE 1.81 (H) 01/19/2019 0902   CREATININE 1.70 (H) 05/13/2017 0845    Wt Readings from Last 3 Encounters:  09/22/19 232 lb (105.2 kg)  05/25/19 232 lb (105.2 kg)  01/19/19 232 lb (105.2 kg)    ------------------------------------------------------------------------  Hypertension, follow-up:  BP Readings from Last 3 Encounters:  09/22/19 134/78  05/25/19 138/70  01/19/19 112/62    He was last seen for hypertension 4 months ago.  BP at that visit was 138/70. Management since that visit includes no changes. He reports good compliance with treatment. He is not having side effects.  He is exercising. He is not adherent to low salt diet.   Outside blood pressures are checked at home. He is  experiencing none.  Patient denies chest pain, chest pressure/discomfort, claudication, dyspnea, exertional chest pressure/discomfort, fatigue, irregular heart beat, lower extremity edema, near-syncope, orthopnea, palpitations, paroxysmal nocturnal dyspnea, syncope and tachypnea.   Cardiovascular risk factors include advanced age (older than 536for men, 675for women), diabetes mellitus, hypertension and male gender.  Use of agents associated with hypertension: NSAIDS.     Weight trend: stable Wt Readings from Last 3 Encounters:  09/22/19 232 lb (105.2 kg)  05/25/19 232 lb (105.2 kg)  01/19/19 232 lb (105.2 kg)    Current diet: well balanced  ------------------------------------------------------------------------  Follow up for Depression:  The patient was last seen for this 4 months ago. Changes made at last visit include increasing trazodone to 1090mevery night.  He reports good compliance with treatment. He feels that condition is Improved. He is not having side effects.   ------------------------------------------------------------------------------------      Medications: Outpatient Medications Prior to Visit  Medication Sig Dispense Refill  . aspirin 81 MG tablet Take 81 mg by mouth daily.    . Marland Kitchenzelastine (ASTELIN) 0.1 % nasal spray Place 2 sprays into both nostrils 2 (two) times daily. Use in each nostril as directed 30 mL 1  . Blood Glucose Monitoring Suppl (ACCU-CHEK GUIDE) w/Device KIT USE AS DIRECTED DAILY 1 kit 0  . doxazosin (CARDURA) 2 MG tablet Take 1 tablet (2 mg total) by mouth daily. 90 tablet 4  . fluticasone (FLONASE) 50 MCG/ACT nasal spray USE 1 TO  2 SPRAYS IN EACH  NOSTRIL EVERY DAY 48 g 12  . furosemide (LASIX) 40 MG tablet TAKE 1 TABLET BY MOUTH  DAILY 90 tablet 3  . gabapentin (NEURONTIN) 300 MG capsule TAKE 1 CAPSULE BY MOUTH TWO TIMES DAILY 180 capsule 3  . glimepiride (AMARYL) 2 MG tablet Take 1 tablet (2 mg total) by mouth daily. 90 tablet 1  .  glucose blood (ACCU-CHEK GUIDE) test strip USE TO CHECK BLOOD SUGAR  ONCE DAILY FOR TYPE 2  DIABETES 25 strip 0  . ibuprofen (ADVIL,MOTRIN) 200 MG tablet Take 200 mg by mouth every 6 (six) hours as needed.    . Lancets (ACCU-CHEK SOFT TOUCH) lancets Use to check blood sugar daily for type 2 diabetes E11.9 100 each 4  . Lancets Misc. (ACCU-CHEK FASTCLIX LANCET) KIT Used to check glucose.  DX E11.9 1 kit 0  . loratadine (CLARITIN) 10 MG tablet Take 10 mg by mouth daily.    . meloxicam (MOBIC) 15 MG tablet TAKE 1 TABLET BY MOUTH  DAILY AS NEEDED 90 tablet 3  . metFORMIN (GLUCOPHAGE-XR) 500 MG 24 hr tablet TAKE 1 TABLET 2 TIMES DAILY BY MOUTH. 180 tablet 4  . metoprolol tartrate (LOPRESSOR) 25 MG tablet TAKE 1 TABLET BY MOUTH TWO  TIMES DAILY 180 tablet 4  . mirtazapine (REMERON) 7.5 MG tablet Take 7.5 mg by mouth at bedtime.    . Multiple Vitamin (MULTIVITAMIN WITH MINERALS) TABS Take 1 tablet by mouth daily.    Marland Kitchen omeprazole (PRILOSEC) 40 MG capsule Take 1 capsule (40 mg total) by mouth daily. 90 capsule 3  . oxybutynin (DITROPAN) 5 MG tablet TAKE 1 TABLET BY MOUTH  TWICE DAILY 180 tablet 3  . potassium chloride SA (K-DUR) 20 MEQ tablet TAKE 1 TABLET BY MOUTH  DAILY 90 tablet 3  . simvastatin (ZOCOR) 40 MG tablet Take 1 tablet (40 mg total) by mouth at bedtime. 90 tablet 3  . traZODone (DESYREL) 100 MG tablet TAKE 1/2 TO 1 TABLET BY  MOUTH AT BEDTIME AS NEEDED  FOR SLEEP 90 tablet 3  . JARDIANCE 25 MG TABS tablet TAKE 1 TABLET BY MOUTH  DAILY 90 tablet 3   No facility-administered medications prior to visit.      Review of Systems  Constitutional: Negative for appetite change, chills and fever.       Loss of taste  Respiratory: Negative for chest tightness, shortness of breath and wheezing.   Cardiovascular: Negative for chest pain and palpitations.  Gastrointestinal: Negative for abdominal pain, nausea and vomiting.      Objective:    BP 134/78 (BP Location: Left Arm, Patient Position:  Sitting, Cuff Size: Large)   Pulse 82   Temp (!) 97.1 F (36.2 C) (Temporal)   Resp 18   Wt 232 lb (105.2 kg)   SpO2 99% Comment: room air  BMI 43.84 kg/m    Physical Exam    General: Appearance:    Severely obese male in no acute distress  Eyes:    PERRL, conjunctiva/corneas clear, EOM's intact       Lungs:     Clear to auscultation bilaterally, respirations unlabored  Heart:    Normal heart rate. Normal rhythm. No murmurs, rubs, or gallops.   MS:   All extremities are intact.   Neurologic:   Awake, alert, oriented x 3. No apparent focal neurological           defect.        Results for orders placed  or performed in visit on 09/22/19  POCT HgB A1C  Result Value Ref Range   Hemoglobin A1C 7.2 (A) 4.0 - 5.6 %   Est. average glucose Bld gHb Est-mCnc 160       Assessment & Plan:    1. Diabetes mellitus with nephropathy ([place]) Fairly well controlled, but Vania Rea is too costly for him to take every day. Given samples of 15m tablet ad advised he may take 1/2 tablet daily  - empagliflozin (JARDIANCE) 25 MG TABS tablet; Take 12.5 mg by mouth daily.   2. Thrombocytopenia ([facility]) - CBC  3. Morbid obesity ([place] Continue activity as tolerating and limited calorie intake   4. Chronic diastolic congestive heart failure ([facility]) Stable on current medications. Continue routine follow up with Dr. CClayborn Bigness  - Comprehensive metabolic panel - Lipid panel  5. Coronary artery disease involving native coronary artery of native heart without angina pectoris/ Aortic atherosclerosis Asymptomatic. Compliant with medication.  Continue aggressive risk factor modification.    6. Chronic kidney disease (CKD), active medical management without dialysis, stage 3b (moderate)  - Comprehensive metabolic panel    The entirety of the information documented in the History of Present Illness, Review of Systems and Physical Exam were personally obtained by me. Portions of this information were initially  documented by RMeyer Cory CMA and reviewed by me for thoroughness and accuracy.    DLelon Huh MD  [phone](phone) [phone](fax)  [place]

## 2019-09-22 ENCOUNTER — Ambulatory Visit (INDEPENDENT_AMBULATORY_CARE_PROVIDER_SITE_OTHER): Payer: Medicare Other | Admitting: Family Medicine

## 2019-09-22 ENCOUNTER — Encounter: Payer: Self-pay | Admitting: Family Medicine

## 2019-09-22 ENCOUNTER — Other Ambulatory Visit: Payer: Self-pay

## 2019-09-22 VITALS — BP 134/78 | HR 82 | Temp 97.1°F | Resp 18 | Wt 232.0 lb

## 2019-09-22 DIAGNOSIS — E1121 Type 2 diabetes mellitus with diabetic nephropathy: Secondary | ICD-10-CM

## 2019-09-22 DIAGNOSIS — I2510 Atherosclerotic heart disease of native coronary artery without angina pectoris: Secondary | ICD-10-CM | POA: Diagnosis not present

## 2019-09-22 DIAGNOSIS — I5032 Chronic diastolic (congestive) heart failure: Secondary | ICD-10-CM | POA: Diagnosis not present

## 2019-09-22 DIAGNOSIS — N1832 Chronic kidney disease, stage 3b: Secondary | ICD-10-CM

## 2019-09-22 DIAGNOSIS — D696 Thrombocytopenia, unspecified: Secondary | ICD-10-CM

## 2019-09-22 DIAGNOSIS — N183 Chronic kidney disease, stage 3 unspecified: Secondary | ICD-10-CM | POA: Diagnosis not present

## 2019-09-22 DIAGNOSIS — I700 Atherosclerosis of aorta: Secondary | ICD-10-CM

## 2019-09-22 LAB — POCT GLYCOSYLATED HEMOGLOBIN (HGB A1C)
Est. average glucose Bld gHb Est-mCnc: 160
Hemoglobin A1C: 7.2 % — AB (ref 4.0–5.6)

## 2019-09-22 MED ORDER — JARDIANCE 25 MG PO TABS: 12.5000 mg | ORAL_TABLET | Freq: Every day | ORAL | Status: DC

## 2019-09-22 NOTE — Patient Instructions (Signed)
.   Please review the attached list of medications and notify my office if there are any errors.   . Please bring all of your medications to every appointment so we can make sure that our medication list is the same as yours.    You can start cutting the Jardiance in half and just take 1/2 of a 25mg  tablet daily

## 2019-09-23 LAB — LIPID PANEL
Chol/HDL Ratio: 3.4 ratio (ref 0.0–5.0)
Cholesterol, Total: 145 mg/dL (ref 100–199)
HDL: 43 mg/dL
LDL Chol Calc (NIH): 48 mg/dL (ref 0–99)
Triglycerides: 362 mg/dL — ABNORMAL HIGH (ref 0–149)
VLDL Cholesterol Cal: 54 mg/dL — ABNORMAL HIGH (ref 5–40)

## 2019-09-23 LAB — COMPREHENSIVE METABOLIC PANEL
ALT: 41 IU/L (ref 0–44)
AST: 39 IU/L (ref 0–40)
Albumin/Globulin Ratio: 1.8 (ref 1.2–2.2)
Albumin: 4.6 g/dL (ref 3.6–4.6)
Alkaline Phosphatase: 58 IU/L (ref 39–117)
BUN/Creatinine Ratio: 13 (ref 10–24)
BUN: 24 mg/dL (ref 8–27)
Bilirubin Total: 0.4 mg/dL (ref 0.0–1.2)
CO2: 22 mmol/L (ref 20–29)
Calcium: 10.5 mg/dL — ABNORMAL HIGH (ref 8.6–10.2)
Chloride: 99 mmol/L (ref 96–106)
Creatinine, Ser: 1.85 mg/dL — ABNORMAL HIGH (ref 0.76–1.27)
GFR calc Af Amer: 38 mL/min/{1.73_m2} — ABNORMAL LOW (ref >59)
GFR calc non Af Amer: 33 mL/min/{1.73_m2} — ABNORMAL LOW (ref >59)
Globulin, Total: 2.5 g/dL (ref 1.5–4.5)
Glucose: 123 mg/dL — ABNORMAL HIGH (ref 65–99)
Potassium: 4.1 mmol/L (ref 3.5–5.2)
Sodium: 141 mmol/L (ref 134–144)
Total Protein: 7.1 g/dL (ref 6.0–8.5)

## 2019-09-23 LAB — CBC
Hematocrit: 38.6 % (ref 37.5–51.0)
Hemoglobin: 12.7 g/dL — ABNORMAL LOW (ref 13.0–17.7)
MCH: 31.9 pg (ref 26.6–33.0)
MCHC: 32.9 g/dL (ref 31.5–35.7)
MCV: 97 fL (ref 79–97)
Platelets: 112 10*3/uL — ABNORMAL LOW (ref 150–450)
RBC: 3.98 x10E6/uL — ABNORMAL LOW (ref 4.14–5.80)
RDW: 14.2 % (ref 11.6–15.4)
WBC: 6.9 10*3/uL (ref 3.4–10.8)

## 2019-09-24 ENCOUNTER — Other Ambulatory Visit: Payer: Self-pay | Admitting: Family Medicine

## 2019-09-24 DIAGNOSIS — E1122 Type 2 diabetes mellitus with diabetic chronic kidney disease: Secondary | ICD-10-CM

## 2019-10-02 ENCOUNTER — Other Ambulatory Visit: Payer: Self-pay | Admitting: Family Medicine

## 2019-10-02 DIAGNOSIS — E1121 Type 2 diabetes mellitus with diabetic nephropathy: Secondary | ICD-10-CM

## 2019-10-07 NOTE — Progress Notes (Signed)
Subjective:   Thornton Dohrmann. is a 85 y.o. male who presents for Medicare Annual/Subsequent preventive examination.    This visit is being conducted through telemedicine due to the COVID-19 pandemic. This patient has given me verbal consent via doximity to conduct this visit, patient states they are participating from their home address. Some vital signs may be absent or patient reported.    Patient identification: identified by name, DOB, and current address  Review of Systems:  N/A  Cardiac Risk Factors include: advanced age (>58mn, >>68women);diabetes mellitus;dyslipidemia;male gender;hypertension     Objective:    Vitals: There were no vitals taken for this visit.  There is no height or weight on file to calculate BMI. Unable to obtain vitals due to visit being conducted via telephonically.   Advanced Directives 10/12/2019 08/26/2018 04/10/2018 01/03/2018 11/18/2017 01/08/2017 11/05/2015  Does Patient Have a Medical Advance Directive? Yes No Yes Yes Yes Yes -  Type of AParamedicof AForest ParkLiving will - HBerg[place]fieldLiving will [place]leLiving will - [place]Living will -  Does patient want to make changes to medical advance directive? - - - - - - Yes - information given  Copy of HWalla Wall[place] Eastin Chart? No - copy requested - No - copy requested No - copy requested - No - copy requested -  Would patient like information on creating a medical advance directive? - No - Patient declined - - - - Yes - EScientist, clinical (histocompatibility and immunogenetics)given    Tobacco Social History   Tobacco Use  Smoking Status Former Smoker  . Packs/day: 1.00  . Years: 25.00  . Pack years: 25.00  . Types: Cigarettes  . Quit date: 07/09/1968  . Years since quitting: 51.2  Smokeless Tobacco Never Used  Tobacco Comment   quit at age 85    Counseling given: Not Answered Comment: quit at age 85  Clinical Intake:  Pre-visit  preparation completed: Yes  Pain : 0-10 Pain Score: 7  Pain Type: Chronic pain Pain Location: Back Pain Orientation: Lower Pain Descriptors / Indicators: Radiating, Aching(radiates down legs) Pain Frequency: Constant     Nutritional Risks: Non-healing wound(Wound on leg that has been draining. Pt declined visit request and states he will call the office in a week if no better.) Diabetes: Yes  How often do you need to have someone help you when you read instructions, pamphlets, or other written materials from your doctor or pharmacy?: 1 - Never   Diabetes:  Is the patient diabetic?  Yes  If diabetic, was a CBG obtained today?  No  Did the patient bring in their glucometer from home?  No  How often do you monitor your CBG's? Once a day in the morning.   Financial Strains and Diabetes Management:  Are you having any financial strains with the device, your supplies or your medication? No .  Does the patient want to be seen by Chronic Care Management for management of their diabetes?  No  Would the patient like to be referred to a Nutritionist or for Diabetic Management?  No   Diabetic Exams:  Diabetic Eye Exam: Completed 01/07/19. Repeat yearly.  Diabetic Foot Exam: Completed 01/08/17. Pt has been advised about the importance in completing this exam. Note made to follow up on this at next in office apt.    Interpreter Needed?: No  Information [facility] :: [facility]  Past Medical History:  Diagnosis  Date  . Anemia   . Bruises easily   . Diabetes mellitus without complication ([place])   . H/O esophagogastroduodenoscopy   . H/O hiatal hernia   . History of blood transfusion    no reaction noted  . History of bronchitis    last time a couple of yrs ago  . History of gastric ulcer   . History of MRSA infection   . Hyperlipidemia   . Hypertension   . Leg cramps    takes quinine prn  . Melanoma ([place])   . Panic attacks   . Pneumonia    hx of last time a couple of yrs  ago  . PONV (postoperative nausea and vomiting)   . 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 ANKLE FUSION Right 11/04/2015   Procedure: ARTHRODESIS ANKLE;  Surgeon: Samara Deist, DPM;  Location: [facility] ORS;  Service: Podiatry;  Laterality: Right;  . BACK SURGERY     x 4, last lumb. laminectomy Dr. Annette Stable 2008 cervical fusion x 2  . bilateral cataract surgery    . CHOLECYSTECTOMY  1970  . COLONOSCOPY    . CORONARY ANGIOPLASTY    . CORONARY ARTERY BYPASS GRAFT  2000    3 vessels  . ESOPHAGOGASTRODUODENOSCOPY (EGD) WITH PROPOFOL N/A 11/18/2017   Procedure: ESOPHAGOGASTRODUODENOSCOPY (EGD) WITH PROPOFOL;  Surgeon: Jonathon Bellows, MD;  Location: [facility] ENDOSCOPY;  Service: Gastroenterology;  Laterality: N/A;  . JOINT REPLACEMENT    . MOHS SURGERY Left 12/30/2018   [facility], Onalee Hua, MD  . NECK SURGERY     x 2  . prostate laser surgery     x 2  . REPLACEMENT TOTAL KNEE BILATERAL  '86 and 2000  . REVERSE SHOULDER ARTHROPLASTY  05/30/2012   Procedure: REVERSE SHOULDER ARTHROPLASTY;  Surgeon: Augustin Schooling, MD;  Location: [place];  Service: Orthopedics;  Laterality: Right;  right reverse shoulder arthroplasty  . SQUAMOUS CELL CARCINOMA EXCISION  05/01/2019   mid superior forehead/ [facility]/ Dr. Roderic Palau L. Cook  . TOTAL HIP ARTHROPLASTY Right 2012   [place]   Family History  Problem Relation Age of Onset  . Alzheimer's disease Mother   . Heart attack Father   . Bipolar disorder Brother   . Kidney disease Neg Hx   . Prostate cancer Neg Hx    Social History   Socioeconomic History  . Marital status: Divorced    Spouse name: Not on file  . Number of children: 2  . Years of education: 36  . Highest education level: 12th grade  Occupational History  .  Occupation: Retired  Tobacco Use  . Smoking status: Former Smoker    Packs/day: 1.00    Years: 25.00    Pack years: 25.00    Types: Cigarettes    Quit date: 07/09/1968    Years since quitting: 51.2  . Smokeless tobacco: Never Used  . Tobacco comment: quit at age 78  Substance and Sexual Activity  . Alcohol use: No    Alcohol/week: 0.0 standard drinks  . Drug use: No  . Sexual activity: Never  Other Topics Concern  . Not on file  Social History Narrative  . Not on file   Social Determinants of Health   Financial Resource Strain: Low Risk   . Difficulty  of Paying Living Expenses: Not hard at all  Food Insecurity: No Food Insecurity  . Worried About Charity fundraiser in the Last Year: Never true  . Ran Out of Food in the Last Year: Never true  Transportation Needs: No Transportation Needs  . Lack of Transportation (Medical): No  . Lack of Transportation (Non-Medical): No  Physical Activity: Sufficiently Active  . Days of Exercise per Week: 5 days  . Minutes of Exercise per Session: 30 min  Stress: No Stress Concern Present  . Feeling of Stress : Not at all  Social Connections: Slightly Isolated  . Frequency of Communication with Friends and Family: More than three times a week  . Frequency of Social Gatherings with Friends and Family: More than three times a week  . Attends Religious Services: More than 4 times per year  . Active Member of Clubs or Organizations: No  . Attends Archivist Meetings: Never  . Marital Status: Living with partner    Outpatient Encounter Medications as of 10/12/2019  Medication Sig  . Accu-Chek FastClix Lancets MISC USE TO CHECK BLOOD SUGAR  DAILY  . aspirin 81 MG tablet Take 81 mg by mouth daily.  Marland Kitchen azelastine (ASTELIN) 0.1 % nasal spray Place 2 sprays into both nostrils 2 (two) times daily. Use in each nostril as directed  . Blood Glucose Monitoring Suppl (ACCU-CHEK GUIDE) w/Device KIT USE AS DIRECTED DAILY  . doxazosin (CARDURA) 2  MG tablet Take 1 tablet (2 mg total) by mouth daily.  . empagliflozin (JARDIANCE) 25 MG TABS tablet Take 12.5 mg by mouth daily.  . fluticasone (FLONASE) 50 MCG/ACT nasal spray USE 1 TO 2 SPRAYS IN EACH  NOSTRIL EVERY DAY  . furosemide (LASIX) 40 MG tablet TAKE 1 TABLET BY MOUTH  DAILY  . gabapentin (NEURONTIN) 300 MG capsule TAKE 1 CAPSULE BY MOUTH TWO TIMES DAILY  . glimepiride (AMARYL) 2 MG tablet TAKE 1 TABLET BY MOUTH  DAILY  . glucose blood (ACCU-CHEK GUIDE) test strip USE TO CHECK BLOOD SUGAR  ONCE DAILY FOR TYPE 2  DIABETES  . HYDROcodone-acetaminophen (NORCO/VICODIN) 5-325 MG tablet TAKE 1 TABLET BY MOUTH TWICE DAILY AS NEEDED  . ibuprofen (ADVIL,MOTRIN) 200 MG tablet Take 200 mg by mouth every 6 (six) hours as needed.  . Lancets Misc. (ACCU-CHEK FASTCLIX LANCET) KIT Used to check glucose.  DX E11.9  . loratadine (CLARITIN) 10 MG tablet Take 10 mg by mouth daily.  . meloxicam (MOBIC) 15 MG tablet TAKE 1 TABLET BY MOUTH  DAILY AS NEEDED  . metFORMIN (GLUCOPHAGE-XR) 500 MG 24 hr tablet TAKE 1 TABLET BY MOUTH 2  TIMES DAILY  . metoprolol tartrate (LOPRESSOR) 25 MG tablet TAKE 1 TABLET BY MOUTH TWO  TIMES DAILY  . mirtazapine (REMERON) 7.5 MG tablet Take 7.5 mg by mouth at bedtime.  . Multiple Vitamin (MULTIVITAMIN WITH MINERALS) TABS Take 1 tablet by mouth daily.  Marland Kitchen omeprazole (PRILOSEC) 40 MG capsule Take 1 capsule (40 mg total) by mouth daily.  Marland Kitchen oxybutynin (DITROPAN) 5 MG tablet TAKE 1 TABLET BY MOUTH  TWICE DAILY  . potassium chloride SA (K-DUR) 20 MEQ tablet TAKE 1 TABLET BY MOUTH  DAILY  . simvastatin (ZOCOR) 40 MG tablet Take 1 tablet (40 mg total) by mouth at bedtime.  . traZODone (DESYREL) 100 MG tablet TAKE 1/2 TO 1 TABLET BY  MOUTH AT BEDTIME AS NEEDED  FOR SLEEP  . [DISCONTINUED] Lancets (ACCU-CHEK SOFT TOUCH) lancets Use to check blood sugar daily for  type 2 diabetes E11.9   No facility-administered encounter medications on file as of 10/12/2019.    Activities of Daily  Living In your present state of health, do you have any difficulty performing the following activities: 10/12/2019 09/22/2019  Hearing? N Y  Vision? N Y  Difficulty concentrating or making decisions? N N  Walking or climbing stairs? Y Y  Comment Due to mobility issues. Uses a Cocker or wheelchair. -  Dressing or bathing? N N  Doing errands, shopping? N N  Preparing Food and eating ? N -  Using the Toilet? N -  In the past six months, have you accidently leaked urine? Y -  Comment Currently on Oxybutynin. -  Do you have problems with loss of bowel control? Y -  Comment Rarely -  Managing your Medications? N -  Managing your Finances? N -  Housekeeping or managing your Housekeeping? Y -  Comment Has a Chartered certified accountant for cleaning. -  Some recent data might be hidden    Patient Care Team: Birdie Sons, MD as PCP - General (Family Medicine) Yolonda Kida, MD as Consulting Physician (Cardiology) Erby Pian, MD as Referring Physician (Specialist) Sharlet Salina, MD as Referring Physician (Physical Medicine and Rehabilitation) Jannet Mantis, MD as Consulting Physician (Dermatology)   Assessment:   This is a routine wellness examination for Erroll.  Exercise Activities and Dietary recommendations Current Exercise Habits: Structured exercise class, Type of exercise: Other - see comments(rides an eliptical machine), Time (Minutes): 30, Frequency (Times/Week): 5, Weekly Exercise (Minutes/Week): 150, Intensity: Mild, Exercise limited by: orthopedic condition(s)  Goals    . Prevent falls     Recommend to remove any items from the home that may cause slips or trips.       Fall Risk Fall Risk  10/12/2019 09/22/2019 08/26/2018 07/23/2018 07/15/2018  Falls in the past year? _0 0  Number falls in past yr: 1 1 0 0 -  Comment - - - fell on yesterday, started to sit down on a chair with wheels and it rolled out from under him.  -  Injury with Fall? 0 0 0 0 -  Comment - -  bruising on Right elbow - -  Risk Factor Category  - - - - -  Risk for fall due to : Impaired mobility;Impaired balance/gait - - - -  Follow up Falls prevention discussed Falls evaluation completed;Falls prevention discussed - - -   FALL RISK PREVENTION PERTAINING TO THE HOME:  Any stairs in or around the home? No  If so, are there any without handrails? N/A  Home free of loose throw rugs in walkways, pet beds, electrical cords, etc? Yes  Adequate lighting in your home to reduce risk of falls? Yes   ASSISTIVE DEVICES UTILIZED TO PREVENT FALLS:  Life alert? No  Use of a cane, [place] or w/c? Yes  Grab bars in the bathroom? Yes  Shower chair or bench in shower? Yes  Elevated toilet seat or a handicapped toilet? Yes    TIMED UP AND GO:  Was the test performed? No .    Depression Screen PHQ 2/9 Scores 09/22/2019 08/26/2018 05/29/2018 05/05/2018  PHQ - 2 Score 0 0 0 0  PHQ- 9 Score 0 - - -    Cognitive Function: Declined today.     6CIT Screen 01/08/2017  What Year? 0 points  What month? 0 points  What time? 0 points  Count back from 20 0 points  Months in reverse 0 points  Repeat phrase 2 points  Total Score 2    Immunization History  Administered Date(s) Administered  . Fluad Quad(high Dose 65+) 03/25/2019  . Influenza, High Dose Seasonal PF 04/23/2015, 03/31/2016, 05/02/2017, 04/02/2018  . Influenza-Unspecified 03/09/2014  . Pneumococcal Conjugate-13 03/16/2014  . Pneumococcal Polysaccharide-23 06/12/2000, 01/27/2009  . Td 02/25/2001  . Tdap 05/14/2012  . Zoster 07/16/2006  . Zoster Recombinat (Shingrix) 03/14/2018, 05/19/2018    Qualifies for Shingles Vaccine? Completed series  Tdap: Up to date  Flu Vaccine: Up to date  Pneumococcal Vaccine: Completed series  Screening Tests Health Maintenance  Topic Date Due  . FOOT EXAM  01/08/2018  . OPHTHALMOLOGY EXAM  01/07/2020  . INFLUENZA VACCINE  02/07/2020  . HEMOGLOBIN A1C  03/24/2020  . TETANUS/TDAP   05/14/2022  . PNA vac Low Risk Adult  Completed   Cancer Screenings:  Colorectal Screening: No longer required.   Lung Cancer Screening: (Low Dose CT Chest recommended if Age 96-80 years, 30 pack-year currently smoking OR have quit w/in 15years.) does not qualify.   Additional Screening:  Dental Screening: Recommended annual dental exams for proper oral hygiene  Community Resource Referral:  CRR required this visit?  No        Plan:  I have personally reviewed and addressed the Medicare Annual Wellness questionnaire and have noted the following in the patient's chart:  A. Medical and social history B. Use of alcohol, tobacco or illicit drugs  C. Current medications and supplements D. Functional ability and status E.  Nutritional status F.  Physical activity G. Advance directives H. List of other physicians I.  Hospitalizations, surgeries, and ER visits in previous 12 months J.  [place] such as hearing and vision if needed, cognitive and depression L. Referrals and appointments   In addition, I have reviewed and discussed with patient certain preventive protocols, quality metrics, and best practice recommendations. A written personalized care plan for preventive services as well as general preventive health recommendations were provided to patient.   Glendora Score, [place]  03/12/4738 Nurse Health Advisor  Nurse Notes: Pt needs a diabetic foot exam at next in office visit.

## 2019-10-09 ENCOUNTER — Other Ambulatory Visit: Payer: Self-pay | Admitting: Family Medicine

## 2019-10-12 ENCOUNTER — Ambulatory Visit (INDEPENDENT_AMBULATORY_CARE_PROVIDER_SITE_OTHER): Payer: Medicare Other

## 2019-10-12 ENCOUNTER — Other Ambulatory Visit: Payer: Self-pay

## 2019-10-12 DIAGNOSIS — Z0000 Encounter for general adult medical examination without abnormal findings: Secondary | ICD-10-CM

## 2019-10-12 NOTE — Patient Instructions (Signed)
Mr. Mark Terry , Thank you for taking time to come for your Medicare Wellness Visit. I appreciate your ongoing commitment to your health goals. Please review the following plan we discussed and let me know if I can assist you in the future.   Screening recommendations/referrals: Colonoscopy: No longer required.  Recommended yearly ophthalmology/optometry visit for glaucoma screening and checkup Recommended yearly dental visit for hygiene and checkup  Vaccinations: Influenza vaccine: Up to date Pneumococcal vaccine: Completed series Tdap vaccine: Up to date Shingles vaccine: Completed series    Advanced directives: Please bring a copy of your POA (Power of Attorney) and/or Living Will to your next appointment.   Conditions/risks identified: Fall risk prevention discussed today.   Next appointment: 01/25/20 @ 8:00 AM with Dr Caryn Section. Declined scheduling an AWV for 2022 at this time.   Preventive Care 59 Years and Older, Male Preventive care refers to lifestyle choices and visits with your health care provider that can promote health and wellness. What does preventive care include?  A yearly physical exam. This is also called an annual well check.  Dental exams once or twice a year.  Routine eye exams. Ask your health care provider how often you should have your eyes checked.  Personal lifestyle choices, including:  Daily care of your teeth and gums.  Regular physical activity.  Eating a healthy diet.  Avoiding tobacco and drug use.  Limiting alcohol use.  Practicing safe sex.  Taking low doses of aspirin every day.  Taking vitamin and mineral supplements as recommended by your health care provider. What happens during an annual well check? The services and screenings done by your health care provider during your annual well check will depend on your age, overall health, lifestyle risk factors, and family history of disease. Counseling  Your health care provider may ask you  questions about your:  Alcohol use.  Tobacco use.  Drug use.  Emotional well-being.  Home and relationship well-being.  Sexual activity.  Eating habits.  History of falls.  Memory and ability to understand (cognition).  Work and work Statistician. Screening  You may have the following tests or measurements:  Height, weight, and BMI.  Blood pressure.  Lipid and cholesterol levels. These may be checked every 5 years, or more frequently if you are over 66 years old.  Skin check.  Lung cancer screening. You may have this screening every year starting at age 66 if you have a 30-pack-year history of smoking and currently smoke or have quit within the past 15 years.  Fecal occult blood test (FOBT) of the stool. You may have this test every year starting at age 14.  Flexible sigmoidoscopy or colonoscopy. You may have a sigmoidoscopy every 5 years or a colonoscopy every 10 years starting at age 29.  Prostate cancer screening. Recommendations will vary depending on your family history and other risks.  Hepatitis C blood test.  Hepatitis B blood test.  Sexually transmitted disease (STD) testing.  Diabetes screening. This is done by checking your blood sugar (glucose) after you have not eaten for a while (fasting). You may have this done every 1-3 years.  Abdominal aortic aneurysm (AAA) screening. You may need this if you are a current or former smoker.  Osteoporosis. You may be screened starting at age 83 if you are at high risk. Talk with your health care provider about your test results, treatment options, and if necessary, the need for more tests. Vaccines  Your health care provider may recommend certain  vaccines, such as:  Influenza vaccine. This is recommended every year.  Tetanus, diphtheria, and acellular pertussis (Tdap, Td) vaccine. You may need a Td booster every 10 years.  Zoster vaccine. You may need this after age 3.  Pneumococcal 13-valent conjugate  (PCV13) vaccine. One dose is recommended after age 76.  Pneumococcal polysaccharide (PPSV23) vaccine. One dose is recommended after age 48. Talk to your health care provider about which screenings and vaccines you need and how often you need them. This information is not intended to replace advice given to you by your health care provider. Make sure you discuss any questions you have with your health care provider. Document Released: 07/22/2015 Document Revised: 03/14/2016 Document Reviewed: 04/26/2015 Elsevier Interactive Patient Education  2017 [place] Prevention in the Home Falls can cause injuries. They can happen to people of all ages. There are many things you can do to make your home safe and to help prevent falls. What can I do on the outside of my home?  Regularly fix the edges of walkways and driveways and fix any cracks.  Remove anything that might make you trip as you walk through a door, such as a raised step or threshold.  Trim any bushes or trees on the path to your home.  Use bright outdoor lighting.  Clear any walking paths of anything that might make someone trip, such as rocks or tools.  Regularly check to see if handrails are loose or broken. Make sure that both sides of any steps have handrails.  Any raised decks and porches should have guardrails on the edges.  Have any leaves, snow, or ice cleared regularly.  Use sand or salt on walking paths during winter.  Clean up any spills in your garage right away. This includes oil or grease spills. What can I do in the bathroom?  Use night lights.  Install grab bars by the toilet and in the tub and shower. Do not use towel bars as grab bars.  Use non-skid mats or decals in the tub or shower.  If you need to sit down in the shower, use a plastic, non-slip stool.  Keep the floor dry. Clean up any water that spills on the floor as soon as it happens.  Remove soap buildup in the tub or shower  regularly.  Attach bath mats securely with double-sided non-slip rug tape.  Do not have throw rugs and other things on the floor that can make you trip. What can I do in the bedroom?  Use night lights.  Make sure that you have a light by your bed that is easy to reach.  Do not use any sheets or blankets that are too big for your bed. They should not hang down onto the floor.  Have a firm chair that has side arms. You can use this for support while you get dressed.  Do not have throw rugs and other things on the floor that can make you trip. What can I do in the kitchen?  Clean up any spills right away.  Avoid walking on wet floors.  Keep items that you use a lot in easy-to-reach places.  If you need to reach something above you, use a strong step stool that has a grab bar.  Keep electrical cords out of the way.  Do not use floor polish or wax that makes floors slippery. If you must use wax, use non-skid floor wax.  Do not have throw rugs and other things  on the floor that can make you trip. What can I do with my stairs?  Do not leave any items on the stairs.  Make sure that there are handrails on both sides of the stairs and use them. Fix handrails that are broken or loose. Make sure that handrails are as long as the stairways.  Check any carpeting to make sure that it is firmly attached to the stairs. Fix any carpet that is loose or worn.  Avoid having throw rugs at the top or bottom of the stairs. If you do have throw rugs, attach them to the floor with carpet tape.  Make sure that you have a light switch at the top of the stairs and the bottom of the stairs. If you do not have them, ask someone to add them for you. What else can I do to help prevent falls?  Wear shoes that:  Do not have high heels.  Have rubber bottoms.  Are comfortable and fit you well.  Are closed at the toe. Do not wear sandals.  If you use a stepladder:  Make sure that it is fully  opened. Do not climb a closed stepladder.  Make sure that both sides of the stepladder are locked into place.  Ask someone to hold it for you, if possible.  Clearly mark and make sure that you can see:  Any grab bars or handrails.  First and last steps.  Where the edge of each step is.  Use tools that help you move around (mobility aids) if they are needed. These include:  Canes.  Walkers.  Scooters.  Crutches.  Turn on the lights when you go into a dark area. Replace any light bulbs as soon as they burn out.  Set up your furniture so you have a clear path. Avoid moving your furniture around.  If any of your floors are uneven, fix them.  If there are any pets around you, be aware of where they are.  Review your medicines with your doctor. Some medicines can make you feel dizzy. This can increase your chance of falling. Ask your doctor what other things that you can do to help prevent falls. This information is not intended to replace advice given to you by your health care provider. Make sure you discuss any questions you have with your health care provider. Document Released: 04/21/2009 Document Revised: 12/01/2015 Document Reviewed: 07/30/2014 Elsevier Interactive Patient Education  2017 [employer].

## 2019-10-27 DIAGNOSIS — E11622 Type 2 diabetes mellitus with other skin ulcer: Secondary | ICD-10-CM | POA: Diagnosis not present

## 2019-10-27 DIAGNOSIS — R600 Localized edema: Secondary | ICD-10-CM | POA: Diagnosis not present

## 2019-10-27 DIAGNOSIS — R0602 Shortness of breath: Secondary | ICD-10-CM | POA: Diagnosis not present

## 2019-10-27 DIAGNOSIS — I25119 Atherosclerotic heart disease of native coronary artery with unspecified angina pectoris: Secondary | ICD-10-CM | POA: Diagnosis not present

## 2019-10-27 DIAGNOSIS — I208 Other forms of angina pectoris: Secondary | ICD-10-CM | POA: Diagnosis not present

## 2019-11-06 ENCOUNTER — Other Ambulatory Visit: Payer: Self-pay | Admitting: Family Medicine

## 2019-11-06 DIAGNOSIS — M5116 Intervertebral disc disorders with radiculopathy, lumbar region: Secondary | ICD-10-CM

## 2020-01-01 ENCOUNTER — Other Ambulatory Visit: Payer: Self-pay | Admitting: Family Medicine

## 2020-01-04 DIAGNOSIS — M5030 Other cervical disc degeneration, unspecified cervical region: Secondary | ICD-10-CM | POA: Diagnosis not present

## 2020-01-04 DIAGNOSIS — M4802 Spinal stenosis, cervical region: Secondary | ICD-10-CM | POA: Diagnosis not present

## 2020-01-04 DIAGNOSIS — M5136 Other intervertebral disc degeneration, lumbar region: Secondary | ICD-10-CM | POA: Diagnosis not present

## 2020-01-04 DIAGNOSIS — M48062 Spinal stenosis, lumbar region with neurogenic claudication: Secondary | ICD-10-CM | POA: Diagnosis not present

## 2020-01-04 DIAGNOSIS — M5416 Radiculopathy, lumbar region: Secondary | ICD-10-CM | POA: Diagnosis not present

## 2020-01-12 DIAGNOSIS — E119 Type 2 diabetes mellitus without complications: Secondary | ICD-10-CM | POA: Diagnosis not present

## 2020-01-12 LAB — HM DIABETES EYE EXAM

## 2020-01-21 ENCOUNTER — Telehealth: Payer: Self-pay | Admitting: Family Medicine

## 2020-01-21 NOTE — Chronic Care Management (AMB) (Signed)
Chronic Care Management   Note  01/21/2020 Name: Mark Terry. MRN: [id number] DOB: [birth year]  Mark Regal Kannan Proia. is a 85 y.o. year old male who is a primary care patient of Caryn Section, Kirstie Peri, MD. I reached out to Mark Terry. by phone today in response to a referral sent by [name]'s health plan.     [name] was given information about Chronic Care Management services today including:  1. CCM service includes personalized support from designated clinical staff supervised by his physician, including individualized plan of care and coordination with other care providers 2. 24/7 contact phone numbers for assistance for urgent and routine care needs. 3. Service will only be billed when office clinical staff spend 20 minutes or more in a month to coordinate care. 4. Only one practitioner may furnish and bill the service in a calendar month. 5. The patient may stop CCM services at any time (effective at the end of the month) by phone call to the office staff. 6. The patient will be responsible for cost sharing (co-pay) of up to 20% of the service fee (after annual deductible is met).  Patient wishes to consider information provided and/or speak with a member of the care team before deciding about enrollment in care management services.   Follow up plan: The patient has been provided with contact information for the care management team and has been advised to call with any health related questions or concerns.   Noreene Larsson, [place], [place], [place] [address] Direct Dial: [phone] [email or web address] Website: [email or web address]

## 2020-01-22 NOTE — Progress Notes (Signed)
I,Mark Terry,acting as a scribe for Mark Huh, MD.,have documented all relevant documentation on the behalf of Mark Huh, MD,as directed by  Mark Huh, MD while in the presence of Mark Huh, MD.    Established patient visit   Patient: Mark Terry.   DOB: 07-Feb-1935   85 y.o. Male  MRN: [id number] Visit Date: 01/25/2020  Today's healthcare provider: [name], MD   Chief Complaint  Patient presents with  . Diabetes  . Hypertension   Subjective    HPI  Diabetes Mellitus Type II, Follow-up  Lab Results  Component Value Date   HGBA1C 7.2 (A) 09/22/2019   HGBA1C 8.0 (A) 05/25/2019   HGBA1C 7.8 (A) 01/19/2019   Wt Readings from Last 3 Encounters:  01/25/20 229 lb (103.9 kg)  09/22/19 232 lb (105.2 kg)  05/25/19 232 lb (105.2 kg)   Last seen for diabetes 4 months ago.  Management since then includes giving patient samples of Jardiance 61m tablet and advising to take 1/2 tablet daily. He reports good compliance with treatment. He is not having side effects.  Symptoms: No fatigue No foot ulcerations  No appetite changes No nausea  No paresthesia of the feet  No polydipsia  No polyuria No visual disturbances   No vomiting     Home blood sugar records: fasting range: 120's  Episodes of hypoglycemia? No    Current insulin regiment: none Most Recent Eye Exam: <1 year ago Current exercise: stationary bike Current diet habits: well balanced  Pertinent Labs: Lab Results  Component Value Date   CHOL 145 09/22/2019   HDL 43 09/22/2019   LDLCALC 48 09/22/2019   TRIG 362 (H) 09/22/2019   CHOLHDL 3.4 09/22/2019   Lab Results  Component Value Date   NA 141 09/22/2019   K 4.1 09/22/2019   CREATININE 1.85 (H) 09/22/2019   GFRNONAA 33 (L) 09/22/2019   GFRAA 38 (L) 09/22/2019   GLUCOSE 123 (H) 09/22/2019     ---------------------------------------------------------------------------------------------------  Follow up for  CKD:  The patient was last seen for this 4 months ago. Changes made at last visit include advising patient to drink more water due to worsened kidney functions.  He reports good compliance with treatment. He feels that condition is Unchanged. He is not having side effects.   -----------------------------------------------------------------------------------------  Hypertension, follow-up  BP Readings from Last 3 Encounters:  01/25/20 (!) 152/70  09/22/19 134/78  05/25/19 138/70   Wt Readings from Last 3 Encounters:  01/25/20 229 lb (103.9 kg)  09/22/19 232 lb (105.2 kg)  05/25/19 232 lb (105.2 kg)     He was last seen for hypertension 8 months ago.  BP at that visit was 138/70. Management since that visit includes continue same medications.  He reports good compliance with treatment. He is not having side effects.  He is following a Regular diet. He is exercising. He does not smoke.  Use of agents associated with hypertension: NSAIDS.   Outside blood pressures are 150/80. Symptoms: No chest pain No chest pressure  No palpitations No syncope  No dyspnea No orthopnea  No paroxysmal nocturnal dyspnea Yes lower extremity edema   Pertinent labs: Lab Results  Component Value Date   CHOL 145 09/22/2019   HDL 43 09/22/2019   LDLCALC 48 09/22/2019   TRIG 362 (H) 09/22/2019   CHOLHDL 3.4 09/22/2019   Lab Results  Component Value Date   NA 141 09/22/2019   K 4.1 09/22/2019   CREATININE 1.85 (H)  09/22/2019   GFRNONAA 33 (L) 09/22/2019   GFRAA 38 (L) 09/22/2019   GLUCOSE 123 (H) 09/22/2019     The ASCVD Risk score Mikey Bussing DC Jr., et al., 2013) failed to calculate for the following reasons:   The 2013 ASCVD risk score is only valid for ages 69 to 71   ---------------------------------------------------------------------------------------------------    Medications: Outpatient Medications Prior to Visit  Medication Sig  . Accu-Chek FastClix Lancets MISC USE TO  CHECK BLOOD SUGAR  DAILY  . aspirin 81 MG tablet Take 81 mg by mouth daily.  Marland Kitchen azelastine (ASTELIN) 0.1 % nasal spray Place 2 sprays into both nostrils 2 (two) times daily. Use in each nostril as directed  . Blood Glucose Monitoring Suppl (ACCU-CHEK GUIDE) w/Device KIT USE AS DIRECTED DAILY  . doxazosin (CARDURA) 2 MG tablet Take 1 tablet (2 mg total) by mouth daily.  . empagliflozin (JARDIANCE) 25 MG TABS tablet Take 12.5 mg by mouth daily.  . fluticasone (FLONASE) 50 MCG/ACT nasal spray USE 1 TO 2 SPRAYS IN EACH  NOSTRIL EVERY DAY  . furosemide (LASIX) 40 MG tablet TAKE 1 TABLET BY MOUTH  DAILY  . gabapentin (NEURONTIN) 300 MG capsule TAKE 1 CAPSULE BY MOUTH TWO TIMES DAILY  . glimepiride (AMARYL) 2 MG tablet TAKE 1 TABLET BY MOUTH  DAILY  . glucose blood (ACCU-CHEK GUIDE) test strip USE TO CHECK BLOOD SUGAR  ONCE DAILY FOR TYPE 2  DIABETES  . HYDROcodone-acetaminophen (NORCO/VICODIN) 5-325 MG tablet TAKE 1 TABLET BY MOUTH TWICE DAILY AS NEEDED  . ibuprofen (ADVIL,MOTRIN) 200 MG tablet Take 200 mg by mouth every 6 (six) hours as needed.  . Lancets Misc. (ACCU-CHEK FASTCLIX LANCET) KIT Used to check glucose.  DX E11.9  . loratadine (CLARITIN) 10 MG tablet Take 10 mg by mouth daily.  . meloxicam (MOBIC) 15 MG tablet TAKE 1 TABLET BY MOUTH  DAILY AS NEEDED  . metFORMIN (GLUCOPHAGE-XR) 500 MG 24 hr tablet TAKE 1 TABLET BY MOUTH 2  TIMES DAILY  . metoprolol tartrate (LOPRESSOR) 25 MG tablet TAKE 1 TABLET BY MOUTH TWO  TIMES DAILY  . mirtazapine (REMERON) 7.5 MG tablet Take 7.5 mg by mouth at bedtime.  . Multiple Vitamin (MULTIVITAMIN WITH MINERALS) TABS Take 1 tablet by mouth daily.  Marland Kitchen omeprazole (PRILOSEC) 40 MG capsule TAKE 1 CAPSULE BY MOUTH  DAILY  . oxybutynin (DITROPAN) 5 MG tablet TAKE 1 TABLET BY MOUTH  TWICE DAILY  . potassium chloride SA (K-DUR) 20 MEQ tablet TAKE 1 TABLET BY MOUTH  DAILY  . simvastatin (ZOCOR) 40 MG tablet Take 1 tablet (40 mg total) by mouth at bedtime.  . traZODone  (DESYREL) 100 MG tablet TAKE 1/2 TO 1 TABLET BY  MOUTH AT BEDTIME AS NEEDED  FOR SLEEP   No facility-administered medications prior to visit.    Review of Systems  Constitutional: Negative for appetite change, chills and fever.  Respiratory: Negative for chest tightness, shortness of breath and wheezing.   Cardiovascular: Positive for leg swelling (in feet). Negative for chest pain and palpitations.  Gastrointestinal: Negative for abdominal pain, nausea and vomiting.  Neurological: Positive for numbness (in hands).     Objective    BP (!) 152/70 (BP Location: Right Arm, Cuff Size: Large)   Pulse 82   Temp (!) 97.3 F (36.3 C) (Temporal)   Resp 16   Wt 229 lb (103.9 kg)   SpO2 97% Comment: room air  BMI 43.27 kg/m   Physical Exam   General: Appearance:  Severely obese male in no acute distress  Eyes:    PERRL, conjunctiva/corneas clear, EOM's intact       Lungs:     Clear to auscultation bilaterally, respirations unlabored  Heart:    Normal heart rate. Normal rhythm. No murmurs, rubs, or gallops.   MS:   All extremities are intact.   Neurologic:   Awake, alert, oriented x 3. No apparent focal neurological           defect.        Results for orders placed or performed in visit on 01/25/20  POCT HgB A1C  Result Value Ref Range   Hemoglobin A1C 6.9 (A) 4.0 - 5.6 %   Est. average glucose Bld gHb Est-mCnc 151     Assessment & Plan     1. Diabetes mellitus with nephropathy ([place]) Very well controlled, will decrease glimeride to 45m daily with next refill due in August.   2. Chronic kidney disease (CKD), active medical management without dialysis, stage 3 (moderate) Counseled to push clear fluids. Consider renal referral if not improved.  - Renal function panel  3. Essential hypertension BP up somewhat today although usually much better controlle.d Continue current medications.    Follow up for diabetes in 4 months.       The entirety of the information documented  in the History of Present Illness, Review of Systems and Physical Exam were personally obtained by me. Portions of this information were initially documented by the CMA and reviewed by me for thoroughness and accuracy.      DLelon Huh MD  [phone](phone) [phone](fax)  [place]

## 2020-01-25 ENCOUNTER — Ambulatory Visit (INDEPENDENT_AMBULATORY_CARE_PROVIDER_SITE_OTHER): Payer: Medicare Other | Admitting: Family Medicine

## 2020-01-25 ENCOUNTER — Other Ambulatory Visit: Payer: Self-pay

## 2020-01-25 ENCOUNTER — Encounter: Payer: Self-pay | Admitting: Family Medicine

## 2020-01-25 VITALS — BP 152/70 | HR 82 | Temp 97.3°F | Resp 16 | Wt 229.0 lb

## 2020-01-25 DIAGNOSIS — N183 Chronic kidney disease, stage 3 unspecified: Secondary | ICD-10-CM

## 2020-01-25 DIAGNOSIS — I10 Essential (primary) hypertension: Secondary | ICD-10-CM

## 2020-01-25 DIAGNOSIS — E1121 Type 2 diabetes mellitus with diabetic nephropathy: Secondary | ICD-10-CM

## 2020-01-25 LAB — POCT GLYCOSYLATED HEMOGLOBIN (HGB A1C)
Est. average glucose Bld gHb Est-mCnc: 151
Hemoglobin A1C: 6.9 % — AB (ref 4.0–5.6)

## 2020-01-25 NOTE — Patient Instructions (Addendum)
.   Please review the attached list of medications and notify my office if there are any errors.   . We are going to cut back on glimepiride to 1mg  daily when you get your next refill in August

## 2020-01-26 ENCOUNTER — Telehealth: Payer: Self-pay

## 2020-01-26 LAB — RENAL FUNCTION PANEL
Albumin: 4 g/dL (ref 3.6–4.6)
BUN/Creatinine Ratio: 17 (ref 10–24)
BUN: 29 mg/dL — ABNORMAL HIGH (ref 8–27)
CO2: 22 mmol/L (ref 20–29)
Calcium: 9.4 mg/dL (ref 8.6–10.2)
Chloride: 104 mmol/L (ref 96–106)
Creatinine, Ser: 1.75 mg/dL — ABNORMAL HIGH (ref 0.76–1.27)
GFR calc Af Amer: 41 mL/min/{1.73_m2} — ABNORMAL LOW (ref >59)
GFR calc non Af Amer: 35 mL/min/{1.73_m2} — ABNORMAL LOW (ref >59)
Glucose: 139 mg/dL — ABNORMAL HIGH (ref 65–99)
Phosphorus: 3 mg/dL (ref 2.8–4.1)
Potassium: 4.6 mmol/L (ref 3.5–5.2)
Sodium: 141 mmol/L (ref 134–144)

## 2020-01-26 NOTE — Telephone Encounter (Signed)
LMTCB, PEC may give result.  

## 2020-01-26 NOTE — Telephone Encounter (Signed)
Result note read to patient, verbalizes understanding. ?

## 2020-01-26 NOTE — Telephone Encounter (Signed)
-----   Message from Birdie Sons, MD sent at 01/26/2020  7:50 AM EDT ----- Kidney functions are better, although not yet back to normal. Continue to drink plenty of fluids to keep them working. Continue all other  medications unchanged. Follow up in November as scheduled.

## 2020-01-29 ENCOUNTER — Other Ambulatory Visit: Payer: Self-pay | Admitting: Family Medicine

## 2020-01-29 DIAGNOSIS — N3941 Urge incontinence: Secondary | ICD-10-CM

## 2020-01-29 DIAGNOSIS — M5116 Intervertebral disc disorders with radiculopathy, lumbar region: Secondary | ICD-10-CM

## 2020-01-29 NOTE — Telephone Encounter (Signed)
Requested  medications are  due for refill today yes  Requested medications are on the active medication list yes  Last refill 5/22  Last visit 01/2020  Future visit scheduled 05/2020  Notes to clinic Unclear if Mobic was to be continued, next visit not until Nov 2021.

## 2020-02-02 DIAGNOSIS — M5416 Radiculopathy, lumbar region: Secondary | ICD-10-CM | POA: Diagnosis not present

## 2020-02-02 DIAGNOSIS — L820 Inflamed seborrheic keratosis: Secondary | ICD-10-CM | POA: Diagnosis not present

## 2020-02-02 DIAGNOSIS — M48062 Spinal stenosis, lumbar region with neurogenic claudication: Secondary | ICD-10-CM | POA: Diagnosis not present

## 2020-02-02 DIAGNOSIS — M5136 Other intervertebral disc degeneration, lumbar region: Secondary | ICD-10-CM | POA: Diagnosis not present

## 2020-02-02 DIAGNOSIS — D485 Neoplasm of uncertain behavior of skin: Secondary | ICD-10-CM | POA: Diagnosis not present

## 2020-02-07 ENCOUNTER — Other Ambulatory Visit: Payer: Self-pay | Admitting: Family Medicine

## 2020-02-07 DIAGNOSIS — E1121 Type 2 diabetes mellitus with diabetic nephropathy: Secondary | ICD-10-CM

## 2020-02-07 MED ORDER — GLIMEPIRIDE 1 MG PO TABS: 1.0000 mg | ORAL_TABLET | Freq: Every day | ORAL | 3 refills | Status: DC

## 2020-02-23 DIAGNOSIS — M5030 Other cervical disc degeneration, unspecified cervical region: Secondary | ICD-10-CM | POA: Diagnosis not present

## 2020-02-23 DIAGNOSIS — M5412 Radiculopathy, cervical region: Secondary | ICD-10-CM | POA: Diagnosis not present

## 2020-02-27 ENCOUNTER — Other Ambulatory Visit: Payer: Self-pay | Admitting: Family Medicine

## 2020-02-27 NOTE — Telephone Encounter (Signed)
Requested Prescriptions  Pending Prescriptions Disp Refills   potassium chloride SA (KLOR-CON) 20 MEQ tablet [Pharmacy Med Name: Potassium Chloride Crys ER 20 MEQ Oral Tablet Extended Release] 90 tablet 3    Sig: TAKE 1 TABLET BY MOUTH  DAILY     Endocrinology:  Minerals - Potassium Supplementation Failed - 02/27/2020  7:23 AM      Failed - Cr in normal range and within 360 days    Creat  Date Value Ref Range Status  05/13/2017 1.70 (H) 0.70 - 1.11 mg/dL Final    Comment:    For patients >31 years of age, the reference limit for Creatinine is approximately 13% higher for people identified as African-American. .    Creatinine, Ser  Date Value Ref Range Status  01/25/2020 1.75 (H) 0.76 - 1.27 mg/dL Final         Passed - K in normal range and within 360 days    Potassium  Date Value Ref Range Status  01/25/2020 4.6 3.5 - 5.2 mmol/L Final         Passed - Valid encounter within last 12 months    Recent Outpatient Visits          1 month ago Diabetes mellitus with nephropathy [facility])   [facility] Birdie Sons, MD   5 months ago Diabetes mellitus with nephropathy [facility])   [facility] Birdie Sons, MD   9 months ago Diabetes mellitus with nephropathy [facility])   [facility] Birdie Sons, MD   1 year ago Essential (primary) hypertension   [place], Kirstie Peri, MD   1 year ago Morbid obesity [facility])   [place], Kirstie Peri, MD      Future Appointments            In 3 months Fisher, Kirstie Peri, MD [facility], [facility]            gabapentin (NEURONTIN) 300 MG capsule [Pharmacy Med Name: Gabapentin 300 MG Oral Capsule] 180 capsule 3    Sig: TAKE 1 CAPSULE BY MOUTH  TWICE DAILY     Neurology: Anticonvulsants - gabapentin Passed - 02/27/2020  7:23 AM      Passed - Valid encounter within last 12 months    Recent Outpatient Visits          1 month ago Diabetes mellitus  with nephropathy [facility])   [facility] Birdie Sons, MD   5 months ago Diabetes mellitus with nephropathy [facility])   [facility] Birdie Sons, MD   9 months ago Diabetes mellitus with nephropathy [facility])   [facility] Birdie Sons, MD   1 year ago Essential (primary) hypertension   [place], Kirstie Peri, MD   1 year ago Morbid obesity [facility])   [place], Kirstie Peri, MD      Future Appointments            In 3 months Fisher, Kirstie Peri, MD [facility], [facility]            metoprolol tartrate (LOPRESSOR) 25 MG tablet [Pharmacy Med Name: Metoprolol Tartrate 25 MG Oral Tablet] 180 tablet 3    Sig: TAKE 1 TABLET BY MOUTH  TWICE DAILY     Cardiovascular:  Beta Blockers Failed - 02/27/2020  7:23 AM      Failed - Last BP in normal range    BP Readings from Last  1 Encounters:  01/25/20 (!) 152/70         Passed - Last Heart Rate in normal range    Pulse Readings from Last 1 Encounters:  01/25/20 82         Passed - Valid encounter within last 6 months    Recent Outpatient Visits          1 month ago Diabetes mellitus with nephropathy [facility])   [facility] Birdie Sons, MD   5 months ago Diabetes mellitus with nephropathy [facility])   [facility] Birdie Sons, MD   9 months ago Diabetes mellitus with nephropathy [facility])   [facility] Birdie Sons, MD   1 year ago Essential (primary) hypertension   [facility] Birdie Sons, MD   1 year ago Morbid obesity [facility])   [facility] Birdie Sons, MD      Future Appointments            In 3 months Fisher, Kirstie Peri, MD [facility] [place]

## 2020-03-15 ENCOUNTER — Other Ambulatory Visit: Payer: Self-pay | Admitting: Family Medicine

## 2020-03-16 DIAGNOSIS — G4733 Obstructive sleep apnea (adult) (pediatric): Secondary | ICD-10-CM | POA: Diagnosis not present

## 2020-03-16 DIAGNOSIS — Z789 Other specified health status: Secondary | ICD-10-CM | POA: Diagnosis not present

## 2020-03-21 NOTE — Progress Notes (Signed)
Established patient visit   Patient: Mark Terry.   DOB: [birth year]   85 y.o. Male  MRN: [id number] Visit Date: 03/22/2020  Today's healthcare provider: [name], MD   Chief Complaint  Patient presents with  . Laceration   Subjective    Laceration  The laceration is located on the right leg. His tetanus status is UTD (05/14/2012).    Patient reports that he scrapped his lower right leg on a desk at his home 3 days ago. He has only used neosporin and keeping covered with sterile gauze. It is tender, but is able to bear weight.      Medications: Outpatient Medications Prior to Visit  Medication Sig  . Accu-Chek FastClix Lancets MISC USE TO CHECK BLOOD SUGAR  DAILY  . aspirin 81 MG tablet Take 81 mg by mouth daily.  Marland Kitchen azelastine (ASTELIN) 0.1 % nasal spray Place 2 sprays into both nostrils 2 (two) times daily. Use in each nostril as directed  . Blood Glucose Monitoring Suppl (ACCU-CHEK GUIDE) w/Device KIT USE AS DIRECTED DAILY  . doxazosin (CARDURA) 2 MG tablet Take 1 tablet (2 mg total) by mouth daily.  . empagliflozin (JARDIANCE) 25 MG TABS tablet Take 12.5 mg by mouth daily.  . fluticasone (FLONASE) 50 MCG/ACT nasal spray USE 1 TO 2 SPRAYS IN BOTH  NOSTRILS DAILY  . furosemide (LASIX) 40 MG tablet TAKE 1 TABLET BY MOUTH  DAILY  . gabapentin (NEURONTIN) 300 MG capsule TAKE 1 CAPSULE BY MOUTH  TWICE DAILY  . glimepiride (AMARYL) 1 MG tablet Take 1 tablet (1 mg total) by mouth daily.  Marland Kitchen glucose blood (ACCU-CHEK GUIDE) test strip USE TO CHECK BLOOD SUGAR  ONCE DAILY FOR TYPE 2  DIABETES  . HYDROcodone-acetaminophen (NORCO/VICODIN) 5-325 MG tablet TAKE 1 TABLET BY MOUTH TWICE DAILY AS NEEDED  . ibuprofen (ADVIL,MOTRIN) 200 MG tablet Take 200 mg by mouth every 6 (six) hours as needed.  . Lancets Misc. (ACCU-CHEK FASTCLIX LANCET) KIT Used to check glucose.  DX E11.9  . loratadine (CLARITIN) 10 MG tablet Take 10 mg by mouth daily.  . meloxicam (MOBIC) 15 MG tablet TAKE  1 TABLET BY MOUTH  DAILY AS NEEDED  . metFORMIN (GLUCOPHAGE-XR) 500 MG 24 hr tablet TAKE 1 TABLET BY MOUTH 2  TIMES DAILY  . metoprolol tartrate (LOPRESSOR) 25 MG tablet TAKE 1 TABLET BY MOUTH  TWICE DAILY  . Multiple Vitamin (MULTIVITAMIN WITH MINERALS) TABS Take 1 tablet by mouth daily.  Marland Kitchen omeprazole (PRILOSEC) 40 MG capsule TAKE 1 CAPSULE BY MOUTH  DAILY  . oxybutynin (DITROPAN) 5 MG tablet TAKE 1 TABLET BY MOUTH  TWICE DAILY  . potassium chloride SA (KLOR-CON) 20 MEQ tablet TAKE 1 TABLET BY MOUTH  DAILY  . simvastatin (ZOCOR) 40 MG tablet TAKE 1 TABLET BY MOUTH AT  BEDTIME  . traZODone (DESYREL) 100 MG tablet TAKE 1/2 TO 1 TABLET BY  MOUTH AT BEDTIME AS NEEDED  FOR SLEEP   No facility-administered medications prior to visit.    Review of Systems  Constitutional: Negative.   Cardiovascular: Positive for leg swelling.  Skin: Positive for color change and wound.      Objective    BP 120/76   Pulse 85   Temp 97.7 F (36.5 C)   Ht 5' 1" (1.549 m)   Wt 225 lb (102.1 kg)   BMI 42.51 kg/m     Physical Exam      No results found for any visits on  03/22/20.  Assessment & Plan     1. Infected abrasion of right lower extremity, initial encounter High risk of infection due to co-morbidities.   - cephALEXin (KEFLEX) 500 MG capsule; Take 1 capsule (500 mg total) by mouth 3 (three) times daily for 5 days.  Dispense: 15 capsule; Refill: 0  Change dressing daily with neosporin.  Call if symptoms change or if not rapidly improving.      No follow-ups on file.      The entirety of the information documented in the History of Present Illness, Review of Systems and Physical Exam were personally obtained by me. Portions of this information were initially documented by the CMA and reviewed by me for thoroughness and accuracy.      Donald Fisher, MD  [facility] [phone] (phone) [phone] (fax)  [facility] 

## 2020-03-22 ENCOUNTER — Ambulatory Visit (INDEPENDENT_AMBULATORY_CARE_PROVIDER_SITE_OTHER): Payer: Medicare Other | Admitting: Family Medicine

## 2020-03-22 ENCOUNTER — Encounter: Payer: Self-pay | Admitting: Family Medicine

## 2020-03-22 ENCOUNTER — Other Ambulatory Visit: Payer: Self-pay

## 2020-03-22 VITALS — BP 120/76 | HR 85 | Temp 97.7°F | Ht 61.0 in | Wt 225.0 lb

## 2020-03-22 DIAGNOSIS — S80811A Abrasion, right lower leg, initial encounter: Secondary | ICD-10-CM

## 2020-03-22 DIAGNOSIS — L089 Local infection of the skin and subcutaneous tissue, unspecified: Secondary | ICD-10-CM | POA: Diagnosis not present

## 2020-03-22 MED ORDER — CEPHALEXIN 500 MG PO CAPS: 500.0000 mg | ORAL_CAPSULE | Freq: Three times a day (TID) | ORAL | 0 refills | Status: AC

## 2020-03-25 DIAGNOSIS — M5136 Other intervertebral disc degeneration, lumbar region: Secondary | ICD-10-CM | POA: Diagnosis not present

## 2020-03-25 DIAGNOSIS — M5416 Radiculopathy, lumbar region: Secondary | ICD-10-CM | POA: Diagnosis not present

## 2020-03-25 DIAGNOSIS — M48062 Spinal stenosis, lumbar region with neurogenic claudication: Secondary | ICD-10-CM | POA: Diagnosis not present

## 2020-04-04 DIAGNOSIS — M48062 Spinal stenosis, lumbar region with neurogenic claudication: Secondary | ICD-10-CM | POA: Diagnosis not present

## 2020-04-04 DIAGNOSIS — M4802 Spinal stenosis, cervical region: Secondary | ICD-10-CM | POA: Diagnosis not present

## 2020-04-04 DIAGNOSIS — M5136 Other intervertebral disc degeneration, lumbar region: Secondary | ICD-10-CM | POA: Diagnosis not present

## 2020-04-04 DIAGNOSIS — M5030 Other cervical disc degeneration, unspecified cervical region: Secondary | ICD-10-CM | POA: Diagnosis not present

## 2020-04-04 DIAGNOSIS — M5416 Radiculopathy, lumbar region: Secondary | ICD-10-CM | POA: Diagnosis not present

## 2020-04-26 DIAGNOSIS — I10 Essential (primary) hypertension: Secondary | ICD-10-CM | POA: Diagnosis not present

## 2020-04-26 DIAGNOSIS — I2510 Atherosclerotic heart disease of native coronary artery without angina pectoris: Secondary | ICD-10-CM | POA: Diagnosis not present

## 2020-04-26 DIAGNOSIS — R600 Localized edema: Secondary | ICD-10-CM | POA: Diagnosis not present

## 2020-04-26 DIAGNOSIS — I5032 Chronic diastolic (congestive) heart failure: Secondary | ICD-10-CM | POA: Diagnosis not present

## 2020-04-26 DIAGNOSIS — Z951 Presence of aortocoronary bypass graft: Secondary | ICD-10-CM | POA: Diagnosis not present

## 2020-04-29 ENCOUNTER — Other Ambulatory Visit: Payer: Self-pay | Admitting: Family Medicine

## 2020-05-14 ENCOUNTER — Other Ambulatory Visit: Payer: Self-pay | Admitting: Family Medicine

## 2020-05-14 NOTE — Telephone Encounter (Signed)
Requested Prescriptions  Pending Prescriptions Disp Refills  . glucose blood (ACCU-CHEK GUIDE) test strip [Pharmacy Med Name: T STRIP  S ACCU CHEK GUIDE] 100 strip 3    Sig: [place] ONCE  DAILY FOR TYPE 2 DIABETES     Endocrinology: Diabetes - Testing Supplies Passed - 05/14/2020  9:37 PM      Passed - Valid encounter within last 12 months    Recent Outpatient Visits          1 month ago Infected abrasion of right lower extremity, initial encounter   [facility] Birdie Sons, MD   3 months ago Diabetes mellitus with nephropathy [facility])   [facility] Birdie Sons, MD   7 months ago Diabetes mellitus with nephropathy [facility])   [facility] Birdie Sons, MD   11 months ago Diabetes mellitus with nephropathy [facility])   [facility] Birdie Sons, MD   1 year ago Essential (primary) hypertension   [place], Kirstie Peri, MD      Future Appointments            In 2 weeks Fisher, Kirstie Peri, MD [facility] [place], [place]

## 2020-05-17 DIAGNOSIS — M5416 Radiculopathy, lumbar region: Secondary | ICD-10-CM | POA: Diagnosis not present

## 2020-05-17 DIAGNOSIS — M48062 Spinal stenosis, lumbar region with neurogenic claudication: Secondary | ICD-10-CM | POA: Diagnosis not present

## 2020-05-17 DIAGNOSIS — M5136 Other intervertebral disc degeneration, lumbar region: Secondary | ICD-10-CM | POA: Diagnosis not present

## 2020-05-27 NOTE — Progress Notes (Signed)
Established patient visit   Patient: Mark Terry.   DOB: 12-24-1934   85 y.o. Male  MRN: [id number] Visit Date: 05/30/2020  Today's healthcare provider: [name], MD   Chief Complaint  Patient presents with  . Diabetes  . Hypertension  . Chronic Kidney Disease   Subjective    HPI  Diabetes Mellitus Type II, Follow-up  Lab Results  Component Value Date   HGBA1C 6.9 (A) 01/25/2020   HGBA1C 7.2 (A) 09/22/2019   HGBA1C 8.0 (A) 05/25/2019   Wt Readings from Last 3 Encounters:  05/30/20 227 lb (103 kg)  03/22/20 225 lb (102.1 kg)  01/25/20 229 lb (103.9 kg)   Last seen for diabetes 4 months ago.  Management since then includes decreasing glimeride to 26m daily with next refill. He reports good compliance with treatment. He is not having side effects.  Symptoms: No fatigue No foot ulcerations  No appetite changes No nausea  No paresthesia of the feet  No polydipsia  No polyuria No visual disturbances   No vomiting     Home blood sugar records: fasting range: 125-190  Episodes of hypoglycemia? No    Current insulin regiment: none Most Recent Eye Exam: 01/12/2020 Current exercise: none Current diet habits: well balanced  Pertinent Labs: Lab Results  Component Value Date   CHOL 145 09/22/2019   HDL 43 09/22/2019   LDLCALC 48 09/22/2019   TRIG 362 (H) 09/22/2019   CHOLHDL 3.4 09/22/2019   Lab Results  Component Value Date   NA 141 01/25/2020   K 4.6 01/25/2020   CREATININE 1.75 (H) 01/25/2020   GFRNONAA 35 (L) 01/25/2020   GFRAA 41 (L) 01/25/2020   GLUCOSE 139 (H) 01/25/2020     ---------------------------------------------------------------------------------------------------   Follow up for CKD:  The patient was last seen for this 4 months ago. Changes made at last visit include none; patient was advised to drink plenty of fluids.  He reports good compliance with treatment.  He states he does have to urinary several times over  night and many times through the day. No urinary hesitancy, but has sudden urges and sometimes has trouble getting to the bathroom in time. He is taking oxybutynin twice every day.  -----------------------------------------------------------------------------------------   Hypertension, follow-up  BP Readings from Last 3 Encounters:  05/30/20 124/78  03/22/20 120/76  01/25/20 (!) 152/70   Wt Readings from Last 3 Encounters:  05/30/20 227 lb (103 kg)  03/22/20 225 lb (102.1 kg)  01/25/20 229 lb (103.9 kg)     He was last seen for hypertension 4 months ago.  BP at that visit was 152/70. Management since that visit includes continue same medication.  He reports good compliance with treatment. He is not having side effects.  He is following a Regular diet. He is not exercising. He does not smoke.  Use of agents associated with hypertension: NSAIDS.   Outside blood pressures are 140/80. Symptoms: No chest pain No chest pressure  No palpitations No syncope  No dyspnea No orthopnea  No paroxysmal nocturnal dyspnea No lower extremity edema     ---------------------------------------------------------------------------------------------------     Medications: Outpatient Medications Prior to Visit  Medication Sig  . Accu-Chek FastClix Lancets MISC USE TO CHECK BLOOD SUGAR  DAILY  . aspirin 81 MG tablet Take 81 mg by mouth daily.  .Marland Kitchenazelastine (ASTELIN) 0.1 % nasal spray Place 2 sprays into both nostrils 2 (two) times daily. Use in each nostril as directed  . Blood  Glucose Monitoring Suppl (ACCU-CHEK GUIDE) w/Device KIT USE AS DIRECTED DAILY  . doxazosin (CARDURA) 2 MG tablet Take 1 tablet (2 mg total) by mouth daily.  . empagliflozin (JARDIANCE) 25 MG TABS tablet Take 12.5 mg by mouth daily.  . fluticasone (FLONASE) 50 MCG/ACT nasal spray USE 1 TO 2 SPRAYS IN BOTH  NOSTRILS DAILY  . furosemide (LASIX) 40 MG tablet TAKE 1 TABLET BY MOUTH  DAILY  . gabapentin (NEURONTIN) 300 MG  capsule TAKE 1 CAPSULE BY MOUTH  TWICE DAILY  . glimepiride (AMARYL) 1 MG tablet Take 1 tablet (1 mg total) by mouth daily.  Marland Kitchen glucose blood (ACCU-CHEK GUIDE) test strip CHECK BLOOD SUGAR ONCE  DAILY FOR TYPE 2 DIABETES  . HYDROcodone-acetaminophen (NORCO/VICODIN) 5-325 MG tablet TAKE 1 TABLET BY MOUTH TWICE DAILY AS NEEDED  . ibuprofen (ADVIL,MOTRIN) 200 MG tablet Take 200 mg by mouth every 6 (six) hours as needed.  . Lancets Misc. (ACCU-CHEK FASTCLIX LANCET) KIT Used to check glucose.  DX E11.9  . loratadine (CLARITIN) 10 MG tablet Take 10 mg by mouth daily.  . meloxicam (MOBIC) 15 MG tablet TAKE 1 TABLET BY MOUTH  DAILY AS NEEDED  . metFORMIN (GLUCOPHAGE-XR) 500 MG 24 hr tablet TAKE 1 TABLET BY MOUTH 2  TIMES DAILY  . metoprolol tartrate (LOPRESSOR) 25 MG tablet TAKE 1 TABLET BY MOUTH  TWICE DAILY  . Multiple Vitamin (MULTIVITAMIN WITH MINERALS) TABS Take 1 tablet by mouth daily.  Marland Kitchen omeprazole (PRILOSEC) 40 MG capsule TAKE 1 CAPSULE BY MOUTH  DAILY  . oxybutynin (DITROPAN) 5 MG tablet TAKE 1 TABLET BY MOUTH  TWICE DAILY  . potassium chloride SA (KLOR-CON) 20 MEQ tablet TAKE 1 TABLET BY MOUTH  DAILY  . simvastatin (ZOCOR) 40 MG tablet TAKE 1 TABLET BY MOUTH AT  BEDTIME  . traZODone (DESYREL) 100 MG tablet TAKE 1/2 TO 1 TABLET BY  MOUTH AT BEDTIME AS NEEDED  FOR SLEEP   No facility-administered medications prior to visit.    Review of Systems  Constitutional: Negative for appetite change, chills, fatigue and fever.  HENT: Negative for congestion, ear pain, hearing loss, nosebleeds and trouble swallowing.   Eyes: Negative for pain and visual disturbance.  Respiratory: Negative for cough, chest tightness and shortness of breath.   Cardiovascular: Negative for chest pain, palpitations and leg swelling.  Gastrointestinal: Negative for abdominal pain, blood in stool, constipation, diarrhea, nausea and vomiting.  Endocrine: Negative for polydipsia, polyphagia and polyuria.  Genitourinary:  Negative for dysuria and flank pain.  Musculoskeletal: Negative for arthralgias, back pain, joint swelling, myalgias and neck stiffness.  Skin: Negative for color change, rash and wound.  Neurological: Negative for dizziness, tremors, seizures, speech difficulty, weakness, light-headedness and headaches.  Psychiatric/Behavioral: Negative for behavioral problems, confusion, decreased concentration, dysphoric mood and sleep disturbance. The patient is not nervous/anxious.   All other systems reviewed and are negative.     Objective    BP 124/78 (BP Location: Left Arm)   Pulse 78   Temp 98.3 F (36.8 C) (Oral)   Resp 16   Wt 227 lb (103 kg)   BMI 42.89 kg/m    Physical Exam    General: Appearance:    Severely obese male in no acute distress  Eyes:    PERRL, conjunctiva/corneas clear, EOM's intact       Lungs:     Clear to auscultation bilaterally, respirations unlabored  Heart:    Normal heart rate. Normal rhythm. No murmurs, rubs, or gallops.   MS:  All extremities are intact.   Neurologic:   Awake, alert, oriented x 3. No apparent focal neurological           defect.        Results for orders placed or performed in visit on 05/30/20  POCT HgB A1C  Result Value Ref Range   Hemoglobin A1C 7.4 (A) 4.0 - 5.6 %   Est. average glucose Bld gHb Est-mCnc 167   POCT urinalysis dipstick  Result Value Ref Range   Color, UA yellow    Clarity, UA cloudy    Glucose, UA Positive (A) Negative   Bilirubin, UA negative    Ketones, UA negative    Spec Grav, UA 1.015 1.010 - 1.025   Blood, UA hemolyzed small    pH, UA 6.0 5.0 - 8.0   Protein, UA Positive (A) Negative   Urobilinogen, UA 0.2 0.2 or 1.0 E.U./dL   Nitrite, UA positive    Leukocytes, UA Moderate (2+) (A) Negative    Assessment & Plan     1. Diabetes mellitus with nephropathy ([place]) Well controlled. Continue Jardiance.  - Renal function panel Given 6 weeks samples- empagliflozin (JARDIANCE) 10 MG TABS tablet; Take 1  tablet (10 mg total) by mouth daily.  2. Need for influenza vaccination  - Flu Vaccine QUAD High Dose IM (Fluad)  3. Urinary frequency Secondary to UTI as below. Is on oxybutynin. If not resolved when UTI is cleared then consider trial of Myrbetriq  4. Nocturia As above.   5. Aortic atherosclerosis (HCC) Continue simvastatin and - empagliflozin (JARDIANCE) 10 MG TABS tablet; Take 1 tablet (10 mg total) by mouth daily.  6. Acute cystitis without hematuria  - cephALEXin (KEFLEX) 500 MG capsule; Take 1 capsule (500 mg total) by mouth 4 (four) times daily for 7 days.  Dispense: 28 capsule; Refill: 0 - Urine Culture         The entirety of the information documented in the History of Present Illness, Review of Systems and Physical Exam were personally obtained by me. Portions of this information were initially documented by the CMA and reviewed by me for thoroughness and accuracy.      [name], MD  [facility] [phone] (phone) [phone] (fax)  [place]

## 2020-05-30 ENCOUNTER — Other Ambulatory Visit: Payer: Self-pay

## 2020-05-30 ENCOUNTER — Ambulatory Visit (INDEPENDENT_AMBULATORY_CARE_PROVIDER_SITE_OTHER): Payer: Medicare Other | Admitting: Family Medicine

## 2020-05-30 ENCOUNTER — Encounter: Payer: Self-pay | Admitting: Family Medicine

## 2020-05-30 VITALS — BP 124/78 | HR 78 | Temp 98.3°F | Resp 16 | Wt 227.0 lb

## 2020-05-30 DIAGNOSIS — E1121 Type 2 diabetes mellitus with diabetic nephropathy: Secondary | ICD-10-CM | POA: Diagnosis not present

## 2020-05-30 DIAGNOSIS — Z23 Encounter for immunization: Secondary | ICD-10-CM

## 2020-05-30 DIAGNOSIS — N3000 Acute cystitis without hematuria: Secondary | ICD-10-CM | POA: Diagnosis not present

## 2020-05-30 DIAGNOSIS — R350 Frequency of micturition: Secondary | ICD-10-CM | POA: Diagnosis not present

## 2020-05-30 DIAGNOSIS — R351 Nocturia: Secondary | ICD-10-CM | POA: Diagnosis not present

## 2020-05-30 DIAGNOSIS — I700 Atherosclerosis of aorta: Secondary | ICD-10-CM

## 2020-05-30 LAB — POCT URINALYSIS DIPSTICK
Bilirubin, UA: NEGATIVE
Glucose, UA: POSITIVE — AB
Ketones, UA: NEGATIVE
Nitrite, UA: POSITIVE
Protein, UA: POSITIVE — AB
Spec Grav, UA: 1.015 (ref 1.010–1.025)
Urobilinogen, UA: 0.2 E.U./dL
pH, UA: 6 (ref 5.0–8.0)

## 2020-05-30 LAB — POCT GLYCOSYLATED HEMOGLOBIN (HGB A1C)
Est. average glucose Bld gHb Est-mCnc: 167
Hemoglobin A1C: 7.4 % — AB (ref 4.0–5.6)

## 2020-05-30 MED ORDER — EMPAGLIFLOZIN 10 MG PO TABS: 10.0000 mg | ORAL_TABLET | Freq: Every day | ORAL | Status: DC

## 2020-05-30 MED ORDER — CEPHALEXIN 500 MG PO CAPS: 500.0000 mg | ORAL_CAPSULE | Freq: Four times a day (QID) | ORAL | 0 refills | Status: AC

## 2020-05-30 NOTE — Patient Instructions (Addendum)
.   Please review the attached list of medications and notify my office if there are any errors.   . Please bring all of your medications to every appointment so we can make sure that our medication list is the same as yours.   You can take one 10mg  Jardiance in place of 1/2 of the 25mg  tablets

## 2020-05-31 LAB — RENAL FUNCTION PANEL
Albumin: 4.3 g/dL (ref 3.6–4.6)
BUN/Creatinine Ratio: 15 (ref 10–24)
BUN: 28 mg/dL — ABNORMAL HIGH (ref 8–27)
CO2: 22 mmol/L (ref 20–29)
Calcium: 9.7 mg/dL (ref 8.6–10.2)
Chloride: 103 mmol/L (ref 96–106)
Creatinine, Ser: 1.83 mg/dL — ABNORMAL HIGH (ref 0.76–1.27)
GFR calc Af Amer: 39 mL/min/{1.73_m2} — ABNORMAL LOW (ref >59)
GFR calc non Af Amer: 33 mL/min/{1.73_m2} — ABNORMAL LOW (ref >59)
Glucose: 116 mg/dL — ABNORMAL HIGH (ref 65–99)
Phosphorus: 3.4 mg/dL (ref 2.8–4.1)
Potassium: 4.5 mmol/L (ref 3.5–5.2)
Sodium: 142 mmol/L (ref 134–144)

## 2020-06-03 ENCOUNTER — Other Ambulatory Visit: Payer: Self-pay | Admitting: Family Medicine

## 2020-06-03 LAB — URINE CULTURE

## 2020-06-06 ENCOUNTER — Telehealth: Payer: Self-pay

## 2020-06-06 ENCOUNTER — Other Ambulatory Visit: Payer: Self-pay | Admitting: Family Medicine

## 2020-06-06 DIAGNOSIS — R350 Frequency of micturition: Secondary | ICD-10-CM

## 2020-06-06 DIAGNOSIS — N3000 Acute cystitis without hematuria: Secondary | ICD-10-CM

## 2020-06-06 MED ORDER — SULFAMETHOXAZOLE-TRIMETHOPRIM 800-160 MG PO TABS: 1.0000 | ORAL_TABLET | Freq: Two times a day (BID) | ORAL | 0 refills | Status: DC

## 2020-06-06 NOTE — Telephone Encounter (Signed)
-----   Message from Birdie Sons, MD sent at 06/06/2020  9:49 AM EST ----- Urine culture indicates UTI may be resistant to antibiotic that was prescribed. Please change antibiotic to Septra DS 1 tablet twice a day for 7 days.  Kidney functions are slightly worse, but this may be related to UTI. Will recheck at next visit.

## 2020-06-06 NOTE — Telephone Encounter (Signed)
Copied from [place] [id number]. Topic: General - Other >> Jun 06, 2020 10:13 AM Leward Quan A wrote: Reason for CRM: Patient called to inquire of Dr Caryn Section if he can order some more antibiotics for him. States that he is feeling better but not yet 100%  also would like a call back please with an answer  Ph# [phone]

## 2020-06-06 NOTE — Telephone Encounter (Signed)
Patient advised and verbalized understanding. Prescription sent into pharmacy.  

## 2020-06-07 NOTE — Telephone Encounter (Signed)
See result note for urine culture.

## 2020-06-14 ENCOUNTER — Telehealth: Payer: Self-pay | Admitting: Family Medicine

## 2020-06-14 NOTE — Telephone Encounter (Signed)
Patient was complaining or urinary frequency and urgency at visit a few weeks ago and treated for UTI. Please check with patient to see if sx have resolved. If not then we will need to recheck u/s thanks.

## 2020-06-15 NOTE — Telephone Encounter (Signed)
I called and spoke with patient. He reports his symptoms have significantly improved since the last visit, but they haven't completely resolved. Patient does not want to proceed with having an ultrasound at this time. He says he would call us back if he decides to go through with it.

## 2020-07-01 ENCOUNTER — Other Ambulatory Visit: Payer: Self-pay | Admitting: Family Medicine

## 2020-07-01 DIAGNOSIS — I10 Essential (primary) hypertension: Secondary | ICD-10-CM

## 2020-07-04 ENCOUNTER — Other Ambulatory Visit: Payer: Self-pay | Admitting: Physical Medicine and Rehabilitation

## 2020-07-04 DIAGNOSIS — M5030 Other cervical disc degeneration, unspecified cervical region: Secondary | ICD-10-CM | POA: Diagnosis not present

## 2020-07-04 DIAGNOSIS — M5416 Radiculopathy, lumbar region: Secondary | ICD-10-CM | POA: Diagnosis not present

## 2020-07-04 DIAGNOSIS — M5136 Other intervertebral disc degeneration, lumbar region: Secondary | ICD-10-CM | POA: Diagnosis not present

## 2020-07-04 DIAGNOSIS — M4802 Spinal stenosis, cervical region: Secondary | ICD-10-CM | POA: Diagnosis not present

## 2020-07-04 DIAGNOSIS — M48062 Spinal stenosis, lumbar region with neurogenic claudication: Secondary | ICD-10-CM | POA: Diagnosis not present

## 2020-07-07 ENCOUNTER — Other Ambulatory Visit: Payer: Self-pay

## 2020-07-07 ENCOUNTER — Ambulatory Visit
Admission: RE | Admit: 2020-07-07 | Discharge: 2020-07-07 | Disposition: A | Discharge status: Home / Self Care | Payer: Medicare Other | Source: Ambulatory Visit | Attending: Physical Medicine and Rehabilitation | Admitting: Physical Medicine and Rehabilitation

## 2020-07-07 DIAGNOSIS — M5416 Radiculopathy, lumbar region: Secondary | ICD-10-CM | POA: Insufficient documentation

## 2020-07-07 DIAGNOSIS — M545 Low back pain, unspecified: Secondary | ICD-10-CM | POA: Diagnosis not present

## 2020-07-18 DIAGNOSIS — M4802 Spinal stenosis, cervical region: Secondary | ICD-10-CM | POA: Diagnosis not present

## 2020-07-18 DIAGNOSIS — M5412 Radiculopathy, cervical region: Secondary | ICD-10-CM | POA: Diagnosis not present

## 2020-07-18 DIAGNOSIS — M5416 Radiculopathy, lumbar region: Secondary | ICD-10-CM | POA: Diagnosis not present

## 2020-07-18 DIAGNOSIS — M5136 Other intervertebral disc degeneration, lumbar region: Secondary | ICD-10-CM | POA: Diagnosis not present

## 2020-07-18 DIAGNOSIS — M5030 Other cervical disc degeneration, unspecified cervical region: Secondary | ICD-10-CM | POA: Diagnosis not present

## 2020-07-18 DIAGNOSIS — M48062 Spinal stenosis, lumbar region with neurogenic claudication: Secondary | ICD-10-CM | POA: Diagnosis not present

## 2020-07-18 DIAGNOSIS — M5126 Other intervertebral disc displacement, lumbar region: Secondary | ICD-10-CM | POA: Diagnosis not present

## 2020-08-05 ENCOUNTER — Other Ambulatory Visit: Payer: Self-pay | Admitting: Family Medicine

## 2020-08-05 DIAGNOSIS — N3941 Urge incontinence: Secondary | ICD-10-CM

## 2020-08-10 DIAGNOSIS — M48062 Spinal stenosis, lumbar region with neurogenic claudication: Secondary | ICD-10-CM | POA: Diagnosis not present

## 2020-08-10 DIAGNOSIS — M5416 Radiculopathy, lumbar region: Secondary | ICD-10-CM | POA: Diagnosis not present

## 2020-08-10 DIAGNOSIS — M5136 Other intervertebral disc degeneration, lumbar region: Secondary | ICD-10-CM | POA: Diagnosis not present

## 2020-08-25 DIAGNOSIS — R600 Localized edema: Secondary | ICD-10-CM | POA: Diagnosis not present

## 2020-08-25 DIAGNOSIS — Z951 Presence of aortocoronary bypass graft: Secondary | ICD-10-CM | POA: Diagnosis not present

## 2020-08-25 DIAGNOSIS — G4733 Obstructive sleep apnea (adult) (pediatric): Secondary | ICD-10-CM | POA: Diagnosis not present

## 2020-08-25 DIAGNOSIS — I25118 Atherosclerotic heart disease of native coronary artery with other forms of angina pectoris: Secondary | ICD-10-CM | POA: Diagnosis not present

## 2020-08-25 DIAGNOSIS — E119 Type 2 diabetes mellitus without complications: Secondary | ICD-10-CM | POA: Diagnosis not present

## 2020-08-25 DIAGNOSIS — I700 Atherosclerosis of aorta: Secondary | ICD-10-CM | POA: Diagnosis not present

## 2020-08-25 DIAGNOSIS — E7800 Pure hypercholesterolemia, unspecified: Secondary | ICD-10-CM | POA: Diagnosis not present

## 2020-08-25 DIAGNOSIS — I5032 Chronic diastolic (congestive) heart failure: Secondary | ICD-10-CM | POA: Diagnosis not present

## 2020-08-25 DIAGNOSIS — I10 Essential (primary) hypertension: Secondary | ICD-10-CM | POA: Diagnosis not present

## 2020-08-25 DIAGNOSIS — N1832 Chronic kidney disease, stage 3b: Secondary | ICD-10-CM | POA: Diagnosis not present

## 2020-08-25 DIAGNOSIS — J449 Chronic obstructive pulmonary disease, unspecified: Secondary | ICD-10-CM | POA: Diagnosis not present

## 2020-09-10 ENCOUNTER — Other Ambulatory Visit: Payer: Self-pay | Admitting: Family Medicine

## 2020-09-10 NOTE — Telephone Encounter (Signed)
Requested medication (s) are due for refill today: -  Requested medication (s) are on the active medication list: no  Last refill:  05/30/20 "no print"  Future visit scheduled: yes  Notes to clinic:  please review med - do not see in chart that there was a note in chart that discussed discontinuing med   Requested Prescriptions  Pending Prescriptions Disp Refills   JARDIANCE 25 MG TABS tablet [Pharmacy Med Name: Jardiance 25 MG Oral Tablet] 90 tablet 3    Sig: TAKE 1 TABLET BY MOUTH  DAILY      Endocrinology:  Diabetes - SGLT2 Inhibitors Failed - 09/10/2020  2:05 PM      Failed - Cr in normal range and within 360 days    Creat  Date Value Ref Range Status  05/13/2017 1.70 (H) 0.70 - 1.11 mg/dL Final    Comment:    For patients >55 years of age, the reference limit for Creatinine is approximately 13% higher for people identified as African-American. .    Creatinine, Ser  Date Value Ref Range Status  05/30/2020 1.83 (H) 0.76 - 1.27 mg/dL Final          Failed - LDL in normal range and within 360 days    LDL Chol Calc (NIH)  Date Value Ref Range Status  09/22/2019 48 0 - 99 mg/dL Final          Failed - eGFR in normal range and within 360 days    GFR calc Af Amer  Date Value Ref Range Status  05/30/2020 39 (L) >59 mL/min/1.73 Final    Comment:    **In accordance with recommendations from the NKF-ASN Task force,**   [facility] is in the process of updating its eGFR calculation to the   2021 CKD-EPI creatinine equation that estimates kidney function   without a race variable.    GFR calc non Af Amer  Date Value Ref Range Status  05/30/2020 33 (L) >59 mL/min/1.73 Final          Passed - HBA1C is between 0 and 7.9 and within 180 days    Hemoglobin A1C  Date Value Ref Range Status  05/30/2020 7.4 (A) 4.0 - 5.6 % Final   Hgb A1c MFr Bld  Date Value Ref Range Status  05/16/2018 7.7 (H) 4.8 - 5.6 % Final    Comment:             Prediabetes: 5.7 - 6.4           Diabetes: >6.4          Glycemic control for adults with diabetes: <7.0           Passed - Valid encounter within last 6 months    Recent Outpatient Visits           3 months ago Diabetes mellitus with nephropathy [facility])   [facility] Birdie Sons, MD   5 months ago Infected abrasion of right lower extremity, initial encounter   [facility] Birdie Sons, MD   7 months ago Diabetes mellitus with nephropathy [facility])   [facility] Birdie Sons, MD   11 months ago Diabetes mellitus with nephropathy [facility])   [facility] Birdie Sons, MD   1 year ago Diabetes mellitus with nephropathy [facility])   [facility] Birdie Sons, MD       Future Appointments  In 2 weeks Fisher, Kirstie Peri, MD [facility] [place]

## 2020-09-19 DIAGNOSIS — M5412 Radiculopathy, cervical region: Secondary | ICD-10-CM | POA: Diagnosis not present

## 2020-09-19 DIAGNOSIS — M5136 Other intervertebral disc degeneration, lumbar region: Secondary | ICD-10-CM | POA: Diagnosis not present

## 2020-09-19 DIAGNOSIS — M5126 Other intervertebral disc displacement, lumbar region: Secondary | ICD-10-CM | POA: Diagnosis not present

## 2020-09-19 DIAGNOSIS — M4802 Spinal stenosis, cervical region: Secondary | ICD-10-CM | POA: Diagnosis not present

## 2020-09-19 DIAGNOSIS — M5030 Other cervical disc degeneration, unspecified cervical region: Secondary | ICD-10-CM | POA: Diagnosis not present

## 2020-09-19 DIAGNOSIS — M5416 Radiculopathy, lumbar region: Secondary | ICD-10-CM | POA: Diagnosis not present

## 2020-09-19 DIAGNOSIS — M48062 Spinal stenosis, lumbar region with neurogenic claudication: Secondary | ICD-10-CM | POA: Diagnosis not present

## 2020-09-27 ENCOUNTER — Ambulatory Visit (INDEPENDENT_AMBULATORY_CARE_PROVIDER_SITE_OTHER): Payer: Medicare Other | Admitting: Family Medicine

## 2020-09-27 ENCOUNTER — Encounter: Payer: Self-pay | Admitting: Family Medicine

## 2020-09-27 ENCOUNTER — Other Ambulatory Visit: Payer: Self-pay

## 2020-09-27 VITALS — BP 113/73 | HR 71 | Resp 16 | Wt 226.0 lb

## 2020-09-27 DIAGNOSIS — N183 Chronic kidney disease, stage 3 unspecified: Secondary | ICD-10-CM

## 2020-09-27 DIAGNOSIS — E1121 Type 2 diabetes mellitus with diabetic nephropathy: Secondary | ICD-10-CM

## 2020-09-27 DIAGNOSIS — I700 Atherosclerosis of aorta: Secondary | ICD-10-CM

## 2020-09-27 DIAGNOSIS — N3000 Acute cystitis without hematuria: Secondary | ICD-10-CM | POA: Diagnosis not present

## 2020-09-27 DIAGNOSIS — Z7409 Other reduced mobility: Secondary | ICD-10-CM | POA: Diagnosis not present

## 2020-09-27 DIAGNOSIS — D696 Thrombocytopenia, unspecified: Secondary | ICD-10-CM | POA: Diagnosis not present

## 2020-09-27 DIAGNOSIS — E7800 Pure hypercholesterolemia, unspecified: Secondary | ICD-10-CM | POA: Diagnosis not present

## 2020-09-27 DIAGNOSIS — E11620 Type 2 diabetes mellitus with diabetic dermatitis: Secondary | ICD-10-CM | POA: Diagnosis not present

## 2020-09-27 LAB — POCT URINALYSIS DIPSTICK
Bilirubin, UA: NEGATIVE
Glucose, UA: POSITIVE — AB
Ketones, UA: NEGATIVE
Nitrite, UA: POSITIVE
Protein, UA: POSITIVE — AB
Spec Grav, UA: 1.015 (ref 1.010–1.025)
Urobilinogen, UA: 0.2 E.U./dL
pH, UA: 6 (ref 5.0–8.0)

## 2020-09-27 LAB — POCT GLYCOSYLATED HEMOGLOBIN (HGB A1C)
Est. average glucose Bld gHb Est-mCnc: 169
Hemoglobin A1C: 7.5 % — AB (ref 4.0–5.6)

## 2020-09-27 LAB — POCT UA - MICROALBUMIN: Microalbumin Ur, POC: 50 mg/L

## 2020-09-27 NOTE — Progress Notes (Signed)
Established patient visit   Patient: Mark Terry.   DOB: 07/04/1935   86 y.o. Male  MRN: [id number] Visit Date: 09/27/2020  Today's healthcare provider: [name], MD   Chief Complaint  Patient presents with  . Diabetes   Subjective    HPI  Diabetes Mellitus Type II, Follow-up  Lab Results  Component Value Date   HGBA1C 7.5 (A) 09/27/2020   HGBA1C 7.4 (A) 05/30/2020   HGBA1C 6.9 (A) 01/25/2020   Wt Readings from Last 3 Encounters:  09/27/20 226 lb (102.5 kg)  05/30/20 227 lb (103 kg)  03/22/20 225 lb (102.1 kg)   Last seen for diabetes 4 months ago.  Management since then includes continue same medication. He reports good compliance with treatment. He is not having side effects.  Symptoms: No fatigue No foot ulcerations  No appetite changes No nausea  No paresthesia of the feet  No polydipsia  No polyuria No visual disturbances   No vomiting     Home blood sugar records: fasting range: 150 or less  Episodes of hypoglycemia? No    Current insulin regiment: none Most Recent Eye Exam: 01/12/2020 Current exercise: chair exercises Current diet habits: well balanced  Pertinent Labs: Lab Results  Component Value Date   CHOL 145 09/22/2019   HDL 43 09/22/2019   LDLCALC 48 09/22/2019   TRIG 362 (H) 09/22/2019   CHOLHDL 3.4 09/22/2019   Lab Results  Component Value Date   NA 142 05/30/2020   K 4.5 05/30/2020   CREATININE 1.83 (H) 05/30/2020   GFRNONAA 33 (L) 05/30/2020   GFRAA 39 (L) 05/30/2020   GLUCOSE 116 (H) 05/30/2020     --------------------------------------------------------------------------------------------------- He also reports he is having increasing difficulties bathing himself. Has very limited mobility and requires a [place]. Has difficulty getting in and out of the shower. Does have housekeeper that comes weekly and neighbors bring him but lives alone.   He also had read scaly area in his legs for a long time that are not  at all painful or itching.      Medications: Outpatient Medications Prior to Visit  Medication Sig  . Accu-Chek FastClix Lancets MISC USE TO CHECK BLOOD SUGAR  DAILY  . aspirin 81 MG tablet Take 81 mg by mouth daily.  Marland Kitchen azelastine (ASTELIN) 0.1 % nasal spray Place 2 sprays into both nostrils 2 (two) times daily. Use in each nostril as directed  . Blood Glucose Monitoring Suppl (ACCU-CHEK GUIDE) w/Device KIT USE AS DIRECTED DAILY  . doxazosin (CARDURA) 2 MG tablet TAKE 1 TABLET BY MOUTH  DAILY  . empagliflozin (JARDIANCE) 25 MG TABS tablet Take 1 tablet (25 mg total) by mouth daily.  . fluticasone (FLONASE) 50 MCG/ACT nasal spray USE 1 TO 2 SPRAYS IN BOTH  NOSTRILS DAILY  . furosemide (LASIX) 40 MG tablet TAKE 1 TABLET BY MOUTH  DAILY  . gabapentin (NEURONTIN) 300 MG capsule TAKE 1 CAPSULE BY MOUTH  TWICE DAILY  . glimepiride (AMARYL) 1 MG tablet Take 1 tablet (1 mg total) by mouth daily.  Marland Kitchen glucose blood (ACCU-CHEK GUIDE) test strip CHECK BLOOD SUGAR ONCE  DAILY FOR TYPE 2 DIABETES  . HYDROcodone-acetaminophen (NORCO/VICODIN) 5-325 MG tablet TAKE 1 TABLET BY MOUTH TWICE DAILY AS NEEDED  . ibuprofen (ADVIL,MOTRIN) 200 MG tablet Take 200 mg by mouth every 6 (six) hours as needed.  . Lancets Misc. (ACCU-CHEK FASTCLIX LANCET) KIT Used to check glucose.  DX E11.9  . loratadine (CLARITIN) 10  MG tablet Take 10 mg by mouth daily.  . meloxicam (MOBIC) 15 MG tablet TAKE 1 TABLET BY MOUTH  DAILY AS NEEDED  . metFORMIN (GLUCOPHAGE-XR) 500 MG 24 hr tablet TAKE 1 TABLET BY MOUTH 2  TIMES DAILY  . metoprolol tartrate (LOPRESSOR) 25 MG tablet TAKE 1 TABLET BY MOUTH  TWICE DAILY  . Multiple Vitamin (MULTIVITAMIN WITH MINERALS) TABS Take 1 tablet by mouth daily.  Marland Kitchen omeprazole (PRILOSEC) 40 MG capsule TAKE 1 CAPSULE BY MOUTH  DAILY  . oxybutynin (DITROPAN) 5 MG tablet TAKE 1 TABLET BY MOUTH  TWICE DAILY  . potassium chloride SA (KLOR-CON) 20 MEQ tablet TAKE 1 TABLET BY MOUTH  DAILY  . simvastatin (ZOCOR)  40 MG tablet TAKE 1 TABLET BY MOUTH AT  BEDTIME  . traZODone (DESYREL) 100 MG tablet TAKE 1/2 TO 1 TABLET BY  MOUTH AT BEDTIME AS NEEDED  FOR SLEEP  . [DISCONTINUED] empagliflozin (JARDIANCE) 10 MG TABS tablet Take 1 tablet (10 mg total) by mouth daily. (Patient not taking: Reported on 09/27/2020)  . [DISCONTINUED] sulfamethoxazole-trimethoprim (BACTRIM DS) 800-160 MG tablet Take 1 tablet by mouth 2 (two) times daily. (Patient not taking: Reported on 09/27/2020)   No facility-administered medications prior to visit.    Review of Systems  Constitutional: Negative for appetite change, chills and fever.  Respiratory: Negative for chest tightness, shortness of breath and wheezing.   Cardiovascular: Negative for chest pain and palpitations.  Gastrointestinal: Negative for abdominal pain, nausea and vomiting.  Hematological: Bruises/bleeds easily (right lower leg).       Objective    BP 113/73 (BP Location: Left Arm, Patient Position: Sitting, Cuff Size: Large)   Pulse 71   Resp 16   Wt 226 lb (102.5 kg)   BMI 42.70 kg/m     Physical Exam   General appearance: Severely obese male, cooperative and in no acute distress Head: Normocephalic, without obvious abnormality, atraumatic Respiratory: Respirations even and unlabored, normal respiratory rate Extremities: All extremities are intact.  Skin: Skin color, texture, turgor normal. No rashes seen  Psych: Appropriate mood and affect. Neurologic: Mental status: Alert, oriented to person, place, and time, thought content appropriate.   Results for orders placed or performed in visit on 09/27/20  POCT HgB A1C  Result Value Ref Range   Hemoglobin A1C 7.5 (A) 4.0 - 5.6 %   Est. average glucose Bld gHb Est-mCnc 169   POCT UA - Microalbumin  Result Value Ref Range   Microalbumin Ur, POC 50 mg/L  POCT Urinalysis Dipstick  Result Value Ref Range   Color, UA yellow    Clarity, UA cluody    Glucose, UA Positive (A) Negative   Bilirubin, UA  negative    Ketones, UA negative    Spec Grav, UA 1.015 1.010 - 1.025   Blood, UA Moderate (hemolyzed)    pH, UA 6.0 5.0 - 8.0   Protein, UA Positive (A) Negative   Urobilinogen, UA 0.2 0.2 or 1.0 E.U./dL   Nitrite, UA positive    Leukocytes, UA Moderate (2+) (A) Negative   Appearance     Odor      Assessment & Plan     1. Diabetes mellitus with nephropathy (HCC) Sugars are stable. See how GFR looks on labs below. Consider nephrology referral.  - AMB Referral to [place]  2. Chronic kidney disease (CKD), active medical management without dialysis, stage 3 (moderate) (HCC)  - Renal function panel - AMB Referral to [place]  3.  Hypercholesterolemia He is tolerating simvastatin well with no adverse effects.   - Lipid panel - AMB Referral to [place]  4. Thrombocytopenia (HCC)  - CBC  5. Aortic atherosclerosis ([place]) He is tolerating simvastatin well with no adverse effects.  Continue routine follow up Dr. Clayborn Bigness.   6. 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 Recommend he have his dermatologist take a look at legs at next visit.   8. Limited mobility Is having more trouble with ADLs particularly bathing himself.  - AMB Referral to [place]  9. Acute cystitis without hematuria Was treated for klebsiella UTI in November. U/a is positive today but he denies having any UTI sx.  - Urine Culture   Future Appointments  Date Time Provider [place]  02/01/2021  8:00 AM Dixie Coppa, Kirstie Peri, MD [facility]            [name], MD  [facility] [phone] (phone) [phone] (fax)  [place]

## 2020-09-28 ENCOUNTER — Telehealth: Payer: Self-pay

## 2020-09-28 LAB — RENAL FUNCTION PANEL
Albumin: 4.5 g/dL (ref 3.6–4.6)
BUN/Creatinine Ratio: 17 (ref 10–24)
BUN: 28 mg/dL — ABNORMAL HIGH (ref 8–27)
CO2: 21 mmol/L (ref 20–29)
Calcium: 10.1 mg/dL (ref 8.6–10.2)
Chloride: 100 mmol/L (ref 96–106)
Creatinine, Ser: 1.69 mg/dL — ABNORMAL HIGH (ref 0.76–1.27)
Glucose: 117 mg/dL — ABNORMAL HIGH (ref 65–99)
Phosphorus: 3.3 mg/dL (ref 2.8–4.1)
Potassium: 5 mmol/L (ref 3.5–5.2)
Sodium: 140 mmol/L (ref 134–144)
eGFR: 40 mL/min/{1.73_m2} — ABNORMAL LOW (ref >59)

## 2020-09-28 LAB — LIPID PANEL
Chol/HDL Ratio: 3 ratio (ref 0.0–5.0)
Cholesterol, Total: 131 mg/dL (ref 100–199)
HDL: 44 mg/dL (ref >39)
LDL Chol Calc (NIH): 46 mg/dL (ref 0–99)
Triglycerides: 268 mg/dL — ABNORMAL HIGH (ref 0–149)
VLDL Cholesterol Cal: 41 mg/dL — ABNORMAL HIGH (ref 5–40)

## 2020-09-28 LAB — CBC
Hematocrit: 35.7 % — ABNORMAL LOW (ref 37.5–51.0)
Hemoglobin: 12.1 g/dL — ABNORMAL LOW (ref 13.0–17.7)
MCH: 32.5 pg (ref 26.6–33.0)
MCHC: 33.9 g/dL (ref 31.5–35.7)
MCV: 96 fL (ref 79–97)
Platelets: 107 10*3/uL — ABNORMAL LOW (ref 150–450)
RBC: 3.72 x10E6/uL — ABNORMAL LOW (ref 4.14–5.80)
RDW: 13.8 % (ref 11.6–15.4)
WBC: 6.4 10*3/uL (ref 3.4–10.8)

## 2020-09-28 MED ORDER — LOSARTAN POTASSIUM 25 MG PO TABS: 25.0000 mg | ORAL_TABLET | Freq: Every day | ORAL | 1 refills | Status: DC

## 2020-09-28 NOTE — Telephone Encounter (Signed)
I called pt and pt verbalized understanding of information below. Pt agreed to start Losartan as long as he is abel to afford it. If urine culture comes back positive for infection, pt would like Rx sent to [facility].

## 2020-09-28 NOTE — Addendum Note (Signed)
Addended by: Sylvester Harder on: 09/28/2020 12:01 PM   Modules accepted: Orders

## 2020-09-28 NOTE — Telephone Encounter (Signed)
-----   Message from Birdie Sons, MD sent at 09/28/2020  7:02 AM EDT ----- Kidney functions have improved slightly but not quite back to normal. Need to start losartan 25mg  once a day for kidneys, #90 rf x 1. Cholesterol is good at 131. Blood count is stable. Continue current medications.  Follow up in September as scheduled.  Urine cultures are still pending.

## 2020-09-29 ENCOUNTER — Telehealth: Payer: Self-pay | Admitting: *Deleted

## 2020-09-29 NOTE — Chronic Care Management (AMB) (Signed)
  Chronic Care Management   Note  09/29/2020 Name: Riggin Cuttino. MRN: [id number] DOB: Feb 02, 1935  Okey Regal Justo Hengel. is a 86 y.o. year old male who is a primary care patient of Caryn Section, Kirstie Peri, MD. I reached out to Ma Rings. by phone today in response to a referral sent by [name]'s PCP, Dr. Caryn Section.     Mr. Mclear was given information about Chronic Care Management services today including:  1. CCM service includes personalized support from designated clinical staff supervised by his physician, including individualized plan of care and coordination with other care providers 2. 24/7 contact phone numbers for assistance for urgent and routine care needs. 3. Service will only be billed when office clinical staff spend 20 minutes or more in a month to coordinate care. 4. Only one practitioner may furnish and bill the service in a calendar month. 5. The patient may stop CCM services at any time (effective at the end of the month) by phone call to the office staff. 6. The patient will be responsible for cost sharing (co-pay) of up to 20% of the service fee (after annual deductible is met).  Patient agreed to services and verbal consent obtained.   Follow up plan: Telephone appointment with care management team member scheduled for:10/11/2020  Logyn Kendrick  Care Guide, [place] [facility]  [place], [place] [address] Direct Dial: [email or web address] Website: [email or web address]

## 2020-09-30 ENCOUNTER — Other Ambulatory Visit: Payer: Self-pay | Admitting: Family Medicine

## 2020-09-30 DIAGNOSIS — N3000 Acute cystitis without hematuria: Secondary | ICD-10-CM

## 2020-09-30 LAB — URINE CULTURE

## 2020-09-30 MED ORDER — SULFAMETHOXAZOLE-TRIMETHOPRIM 800-160 MG PO TABS
2.0000 | ORAL_TABLET | Freq: Two times a day (BID) | ORAL | 0 refills | Status: AC
Start: 2020-09-30 — End: 2020-10-07

## 2020-10-03 ENCOUNTER — Telehealth: Payer: Self-pay

## 2020-10-03 NOTE — Telephone Encounter (Signed)
-----   Message from Birdie Sons, MD sent at 09/30/2020 12:28 PM EDT ----- Urine culture is positive for infection. [facility],  which should clear it up.

## 2020-10-03 NOTE — Telephone Encounter (Signed)
Patients call was returned and labs was given.

## 2020-10-03 NOTE — Telephone Encounter (Signed)
Copied from [place] [id number]. Topic: General - Call Back - No Documentation >> Sep 30, 2020  3:11 PM Leonides Schanz, Ja-Kwan wrote: Reason for CRM: Pt stated he was returning call for lab results. Pt requests call back

## 2020-10-03 NOTE — Telephone Encounter (Signed)
Patient was advised and reports he started the medication already.

## 2020-10-10 ENCOUNTER — Telehealth: Payer: Self-pay

## 2020-10-10 DIAGNOSIS — E1122 Type 2 diabetes mellitus with diabetic chronic kidney disease: Secondary | ICD-10-CM

## 2020-10-10 MED ORDER — METFORMIN HCL ER 500 MG PO TB24: 500.0000 mg | ORAL_TABLET | Freq: Two times a day (BID) | ORAL | 3 refills | Status: DC

## 2020-10-10 NOTE — Telephone Encounter (Signed)
[facility] faxed refill request for the following medications:  metFORMIN (GLUCOPHAGE-XR) 500 MG 24 hr tablet    Please advise.

## 2020-10-10 NOTE — Telephone Encounter (Signed)
I called pt and pt confirmed he was running low on Metformin. Pt was here 2 weeks ago on 09/27/2020 and qualifies for Metformin refill. I have placed refill.

## 2020-10-11 ENCOUNTER — Ambulatory Visit (INDEPENDENT_AMBULATORY_CARE_PROVIDER_SITE_OTHER): Payer: Medicare Other

## 2020-10-11 DIAGNOSIS — E1121 Type 2 diabetes mellitus with diabetic nephropathy: Secondary | ICD-10-CM

## 2020-10-11 DIAGNOSIS — I10 Essential (primary) hypertension: Secondary | ICD-10-CM | POA: Diagnosis not present

## 2020-10-11 DIAGNOSIS — I5032 Chronic diastolic (congestive) heart failure: Secondary | ICD-10-CM

## 2020-10-11 DIAGNOSIS — Z9181 History of falling: Secondary | ICD-10-CM

## 2020-10-11 NOTE — Chronic Care Management (AMB) (Signed)
Chronic Care Management   Initial Visit Note  10/11/2020 Name: Mark Terry. MRN: [id number] DOB: [birth year]  Primary Care Provider: Birdie Sons, MD Reason for referral : Chronic Care Management   Mark Grissett. is a 86 y.o. year old male who is a primary care patient of Fisher, Kirstie Peri, MD. The CCM team was consulted for assistance with chronic disease management and care coordination.  Review of Mark Terry status, including review of consultants reports, relevant labs and test results was conducted today. Collaboration with appropriate care team members was performed as part of the comprehensive evaluation and provision of chronic care management services.    SDOH (Social Determinants of Health) assessments performed: Yes See Care Plan activities for detailed interventions related to SDOH  SDOH Interventions   Flowsheet Row Most Recent Value  SDOH Interventions   Food Insecurity Interventions Intervention Not Indicated  Transportation Interventions Intervention Not Indicated      Medications: Outpatient Encounter Medications as of 10/11/2020  Medication Sig Note  . aspirin 81 MG tablet Take 81 mg by mouth daily. 10/11/2020: Reports taking every other day.  . empagliflozin (JARDIANCE) 25 MG TABS tablet Take 1 tablet (25 mg total) by mouth daily.   . fluticasone (FLONASE) 50 MCG/ACT nasal spray USE 1 TO 2 SPRAYS IN BOTH  NOSTRILS DAILY   . furosemide (LASIX) 40 MG tablet TAKE 1 TABLET BY MOUTH  DAILY   . gabapentin (NEURONTIN) 300 MG capsule TAKE 1 CAPSULE BY MOUTH  TWICE DAILY   . glimepiride (AMARYL) 1 MG tablet Take 1 tablet (1 mg total) by mouth daily.   Marland Kitchen HYDROcodone-acetaminophen (NORCO/VICODIN) 5-325 MG tablet TAKE 1 TABLET BY MOUTH TWICE DAILY AS NEEDED   . loratadine (CLARITIN) 10 MG tablet Take 10 mg by mouth daily.   . meloxicam (MOBIC) 15 MG tablet TAKE 1 TABLET BY MOUTH  DAILY AS NEEDED   . metFORMIN (GLUCOPHAGE-XR) 500 MG 24 hr tablet Take 1 tablet (500  mg total) by mouth 2 (two) times daily.   . metoprolol tartrate (LOPRESSOR) 25 MG tablet TAKE 1 TABLET BY MOUTH  TWICE DAILY   . Multiple Vitamin (MULTIVITAMIN WITH MINERALS) TABS Take 1 tablet by mouth daily.   Marland Kitchen omeprazole (PRILOSEC) 40 MG capsule TAKE 1 CAPSULE BY MOUTH  DAILY   . oxybutynin (DITROPAN) 5 MG tablet TAKE 1 TABLET BY MOUTH  TWICE DAILY   . potassium chloride SA (KLOR-CON) 20 MEQ tablet TAKE 1 TABLET BY MOUTH  DAILY   . simvastatin (ZOCOR) 40 MG tablet TAKE 1 TABLET BY MOUTH AT  BEDTIME   . traZODone (DESYREL) 100 MG tablet TAKE 1/2 TO 1 TABLET BY  MOUTH AT BEDTIME AS NEEDED  FOR SLEEP   . Accu-Chek FastClix Lancets MISC USE TO CHECK BLOOD SUGAR  DAILY   . azelastine (ASTELIN) 0.1 % nasal spray Place 2 sprays into both nostrils 2 (two) times daily. Use in each nostril as directed (Patient not taking: Reported on 10/11/2020)   . Blood Glucose Monitoring Suppl (ACCU-CHEK GUIDE) w/Device KIT USE AS DIRECTED DAILY   . doxazosin (CARDURA) 2 MG tablet TAKE 1 TABLET BY MOUTH  DAILY   . glucose blood (ACCU-CHEK GUIDE) test strip CHECK BLOOD SUGAR ONCE  DAILY FOR TYPE 2 DIABETES   . ibuprofen (ADVIL,MOTRIN) 200 MG tablet Take 200 mg by mouth every 6 (six) hours as needed. (Patient not taking: Reported on 10/11/2020)   . Lancets Misc. (ACCU-CHEK FASTCLIX LANCET) KIT Used to check glucose.  DX E11.9   . losartan (COZAAR) 25 MG tablet Take 1 tablet (25 mg total) by mouth daily.    No facility-administered encounter medications on file as of 10/11/2020.     Objective:  Patient Care Plan: Fall Risk (Adult)    Problem Identified: Fall Risk     Long-Range Goal: Absence of Fall and Fall-Related Injury   Start Date: 10/11/2020  Expected End Date: 02/08/2021  Priority: High  Note:    Fall Risk  10/11/2020 09/27/2020 10/12/2019 09/22/2019 08/26/2018  Falls in the past year? _0 Number falls in past yr: 1 0 1 1 0  Comment - - - - -  Injury with Fall? 0 0 0 0 0  Comment - - - - bruising on Right  elbow  Risk Factor Category  - - - - -  Risk for fall due to : History of fall(s);Impaired balance/gait;Impaired mobility;Medication side effect - Impaired mobility;Impaired balance/gait - -  Follow up Falls prevention discussed Falls evaluation completed Falls prevention discussed Falls evaluation completed;Falls prevention discussed -     Current Barriers:  . Risk for Falls d/t Impaired balance and gait.  . Clinical Goal(s):  Marland Kitchen Over the next 120 days, patient will not experience falls or require emergent care d/t fall related injuries.   Interventions:  . Collaboration with Birdie Sons, MD regarding development and update of comprehensive plan of care as evidenced by provider attestation and co-signature . Inter-disciplinary care team collaboration (see longitudinal plan of care) . Reviewed medications and discussed potential side effects such as dizziness, lightheadedness and frequent urination. . Assessed for falls within the last year. . Provided information regarding safety and fall prevention. Discussed safety devices in the home. Encouraged to continue using his [place] when ambulating. He reports most of his previous falls were r/t balance. Discussed importance of standing and changing positions slowly. Advised to avoid overreaching or attempting to lift weighted objects. Reports having a lift chair, shower chair, transfer bench and safety bars in the home. Reports having a ramp to access his home and a wheelchair to use if needed.  . Discussed ability to perform ADL's and tasks in the home. Reports being able to perform self-care, however his activity tolerance has declined over the past few months, and he requires assistance with tasks in the home. Reports receiving assistance with housekeeping once a week. Reports his neighbors are also available to assist. Agreed to update the care management team if additional assistance is needed in the home.   Self-Care Deficits/Patient  Goals: -Utilize assistive device appropriately with all ambulation -Ensure pathways are clear and well lit -Use caution when standing and changing positions -Wear secure fitting, skid free footwear when ambulating -Notify provider or care management team for questions and new concerns as needed   Follow Up Plan:  Will follow up next month      Patient Care Plan: Diabetes Type 2 (Adult)    Problem Identified: Glycemic Management (Diabetes, Type 2)     Long-Range Goal: Glycemic Management Optimized   Start Date: 10/11/2020  Expected End Date: 02/08/2021  Priority: High  Note:   Objective:  Lab Results  Component Value Date   HGBA1C 7.5 (A) 09/27/2020 .   Lab Results  Component Value Date   CREATININE 1.69 (H) 09/27/2020   CREATININE 1.83 (H) 05/30/2020   CREATININE 1.75 (H) 01/25/2020 .   Marland Kitchen No results found for: EGFR   Current Barriers:  . Chronic disease management  support and educational needs related to Diabetes self-management   Case Manager Clinical Goal(s):  Marland Kitchen Over the next 120 days, patient will demonstrate improved adherence to prescribed treatment plan for Diabetes management as evidenced by taking medications as prescribed, monitoring and recording of CBG, and adherence to a ADA/ carb modified diet.   Interventions:  . Collaboration with Birdie Sons, MD regarding development and update of comprehensive plan of care as evidenced by provider attestation and co-signature . Inter-disciplinary care team collaboration (see longitudinal plan of care) . Reviewed medications. Encouraged to take medications as prescribed and notify team with concerns regarding prescription cost. . Discussed importance of consistent blood glucose monitoring. Reports a fasting blood glucose range of 63 to 108 mg/dl over the past week. Encouraged to continue monitoring and maintain a log. . Reviewed s/sx of hypoglycemia and hyperglycemia along with recommended interventions. He recalls  two low readings of 57 and 63 within the last two weeks. States he was attempting to exercise for 30 minutes after eating a very small meal. Discussed importance of eating well balanced meals and monitoring his blood glucose levels before attempting to exercise. Declined need for nutritional resources or meal delivery.  . Discussed and offered referrals for available Diabetes education classes. His A1C is currently not at goal. Declined need for additional resources.  . Discussed importance of completing recommended DM preventive care. Reports completing recommended footcare. He is scheduled for an eye exam in July.    Patient Goals/Self-Care Activities - Self-administer medications as prescribed - Attend all scheduled provider appointments - Monitor blood glucose levels consistently and utilize recommended interventions - Adhere to prescribed ADA/carb modified - Notify provider or care management team with questions and new concerns as needed   Follow Up Plan:  Will follow up next month     Patient Care Plan: Heart Failure (Adult)    Problem Identified: Symptom Exacerbation (Heart Failure)     Long-Range Goal: Symptom Exacerbation Prevented or Minimized   Start Date: 10/11/2020  Expected End Date: 02/08/2021  Priority: High  Note:    Wt Readings from Last 3 Encounters:  09/27/20 226 lb (102.5 kg)  05/30/20 227 lb (103 kg)  03/22/20 225 lb (102.1 kg)    Current Barriers:  . Chronic Disease Management support and educational needs r/t CHF.  Case Manager Clinical Goal(s):   Over the next 120 days, patient will not require hospitalization or emergent care d/t complications r/t CHF exacerbation.   Interventions:  . Collaboration with Birdie Sons, MD regarding development and update of comprehensive plan of care as evidenced by provider attestation and co-signature . Inter-disciplinary care team collaboration (see longitudinal plan of care) . Discussed current plan for CHF  management. Encouraged to continue taking medications as prescribed. Encouraged to assess symptoms daily and contact clinic with concerns as needed. Encouraged to continue attempting to adhere to a cardiac prudent/heart healthy diet. Advised to closely monitor sodium consumption and avoid highly processed foods when possible. . Provided information regarding recommended weight parameters. Encouraged to weigh daily and record readings. Encouraged to notify provider for weight gain greater than 3 lbs overnight or weight gain greater than 5 lbs within a week. Reports weights have remained stable. Weight today was 220 lbs.  . Discussed s/sx of fluid overload and indications for notifying a provider. Reports his activity tolerance has declined over the past few months but overall feels his CHF if well managed. He monitors his oxygen saturations frequently and reports readings between 93-97%. Denies  chest pain, palpitations, or shortness of breath. Denies increased abdominal or lower extremity edema.  . Reviewed worsening s/sx related to CHF exacerbation and indications for seeking immediate medical attention.   Patient Goals/Self-Care Activities:  -Take all medications as prescribed -Monitor weight and record readings -Adhere to recommended cardiac prudent/heart healthy diet -Notify provider for weight gain outside of established parameters -Contact provider or care management team with questions and new concerns as needed.   Follow Up Plan:  Will follow up next month      Patient Care Plan: Hypertension (Adult)    Problem Identified: Disease Progression (Hypertension)     Long-Range Goal: Disease Progression Prevented or Minimized   Start Date: 10/11/2020  Expected End Date: 02/08/2021  Priority: High  Note:   Objective:  . Last practice recorded BP readings:  BP Readings from Last 3 Encounters:  09/27/20 113/73  05/30/20 124/78  03/22/20 120/76 .   Marland Kitchen Most recent eGFR/CrCl: No results  found for: EGFR  No components found for: CRCL   Current Barriers:  . Chronic Disease Management support and educational needs r/t Hypertension  Case Manager Clinical Goal(s):  Marland Kitchen Over the next 120 days, patient will demonstrate improved adherence to prescribed treatment plan as evidenced by taking all medications as prescribed, monitoring and recording blood pressure, and adhering to a cardiac prudent/heart healthy diet.  Interventions:  . Collaboration with Birdie Sons, MD regarding development and update of comprehensive plan of care as evidenced by provider attestation and co-signature . Inter-disciplinary care team collaboration (see longitudinal plan of care) . Reviewed medications and discussed current plan for HTN self-management. Reports starting Cardura and taking cardiac medications as prescribed. Attempting to comply with a heart healthy diet. . Provided information regarding established blood pressure parameters along with indications for notifying a provider. Monitors his BP regularly. Systolic readings have ranged from the 130's to 150's. Diastolic readings have ranged from the 60's to 70's. Encouraged to continue monitoring and notifying provider with elevated readings.  . Discussed complications of uncontrolled blood pressure. Reviewed s/sx of heart attack, stroke and worsening symptoms that require immediate medical attention.     Patient Goals/Self-Care Activities: -Self-administer medications as prescribed -Attend all scheduled provider appointments -Monitor and record blood pressure -Adhere to recommended cardiac prudent/heart healthy diet -Notify provider or care management team with questions and new concerns as needed   Follow Up Plan:  Will follow up next month     Mark Terry was given information about Chronic Care Management services including:  1. CCM service includes personalized support from designated clinical staff supervised by his physician,  including individualized plan of care and coordination with other care providers 2. 24/7 contact phone numbers for assistance for urgent and routine care needs. 3. Service will only be billed when office clinical staff spend 20 minutes or more in a month to coordinate care. 4. Only one practitioner may furnish and bill the service in a calendar month. 5. The patient may stop CCM services at any time (effective at the end of the month) by phone call to the office staff. 6. The patient will be responsible for cost sharing (co-pay) of up to 20% of the service fee (after annual deductible is met).  Patient agreed to services and verbal consent obtained.      PLAN A member of the care management team will follow up with Mark Terry next month.    [facility] [phone]

## 2020-10-18 NOTE — Patient Instructions (Addendum)
Thank you for allowing the Chronic Care Management team to participate in your care. It was a pleasure speaking with you today. Please feel free to contact me with questions.   Goals Addressed: Patient Care Plan: Fall Risk (Adult)    Problem Identified: Fall Risk     Long-Range Goal: Absence of Fall and Fall-Related Injury   Start Date: 10/11/2020  Expected End Date: 02/08/2021  Priority: High  Note:    Fall Risk  10/11/2020 09/27/2020 10/12/2019 09/22/2019 08/26/2018  Falls in the past year? _0 Number falls in past yr: 1 0 1 1 0  Comment - - - - -  Injury with Fall? 0 0 0 0 0  Comment - - - - bruising on Right elbow  Risk Factor Category  - - - - -  Risk for fall due to : History of fall(s);Impaired balance/gait;Impaired mobility;Medication side effect - Impaired mobility;Impaired balance/gait - -  Follow up Falls prevention discussed Falls evaluation completed Falls prevention discussed Falls evaluation completed;Falls prevention discussed -     Current Barriers:  . Risk for Falls d/t Impaired balance and gait.  . Clinical Goal(s):  Marland Kitchen Over the next 120 days, patient will not experience falls or require emergent care d/t fall related injuries.   Interventions:  . Collaboration with Birdie Sons, MD regarding development and update of comprehensive plan of care as evidenced by provider attestation and co-signature . Inter-disciplinary care team collaboration (see longitudinal plan of care) . Reviewed medications and discussed potential side effects such as dizziness, lightheadedness and frequent urination. . Assessed for falls within the last year. . Provided information regarding safety and fall prevention. Discussed safety devices in the home. Encouraged to continue using his Yero when ambulating. He reports most of his previous falls were r/t balance. Discussed importance of standing and changing positions slowly. Advised to avoid overreaching or attempting to lift  weighted objects. Reports having a lift chair, shower chair, transfer bench and safety bars in the home. Reports having a ramp to access his home and a wheelchair to use if needed.  . Discussed ability to perform ADL's and tasks in the home. Reports being able to perform self-care, however his activity tolerance has declined over the past few months, and he requires assistance with tasks in the home. Reports receiving assistance with housekeeping once a week. Reports his neighbors are also available to assist. Agreed to update the care management team if additional assistance is needed in the home.   Self-Care Deficits/Patient Goals: -Utilize assistive device appropriately with all ambulation -Ensure pathways are clear and well lit -Use caution when standing and changing positions -Wear secure fitting, skid free footwear when ambulating -Notify provider or care management team for questions and new concerns as needed   Follow Up Plan:  Will follow up next month      Patient Care Plan: Diabetes Type 2 (Adult)    Problem Identified: Glycemic Management (Diabetes, Type 2)     Long-Range Goal: Glycemic Management Optimized   Start Date: 10/11/2020  Expected End Date: 02/08/2021  Priority: High  Note:   Objective:  Lab Results  Component Value Date   HGBA1C 7.5 (A) 09/27/2020 .   Lab Results  Component Value Date   CREATININE 1.69 (H) 09/27/2020   CREATININE 1.83 (H) 05/30/2020   CREATININE 1.75 (H) 01/25/2020 .   Marland Kitchen No results found for: EGFR   Current Barriers:  . Chronic disease management support and educational needs  related to Diabetes self-management   Case Manager Clinical Goal(s):  Marland Kitchen Over the next 120 days, patient will demonstrate improved adherence to prescribed treatment plan for Diabetes management as evidenced by taking medications as prescribed, monitoring and recording of CBG, and adherence to a ADA/ carb modified diet.   Interventions:  . Collaboration with  Birdie Sons, MD regarding development and update of comprehensive plan of care as evidenced by provider attestation and co-signature . Inter-disciplinary care team collaboration (see longitudinal plan of care) . Reviewed medications. Encouraged to take medications as prescribed and notify team with concerns regarding prescription cost. . Discussed importance of consistent blood glucose monitoring. Reports a fasting blood glucose range of 63 to 108 mg/dl over the past week. Encouraged to continue monitoring and maintain a log. . Reviewed s/sx of hypoglycemia and hyperglycemia along with recommended interventions. He recalls two low readings of 57 and 63 within the last two weeks. States he was attempting to exercise for 30 minutes after eating a very small meal. Discussed importance of eating well balanced meals and monitoring his blood glucose levels before attempting to exercise. Declined need for nutritional resources or meal delivery.  . Discussed and offered referrals for available Diabetes education classes. His A1C is currently not at goal. Declined need for additional resources.  . Discussed importance of completing recommended DM preventive care. Reports completing recommended footcare. He is scheduled for an eye exam in July.    Patient Goals/Self-Care Activities - Self-administer medications as prescribed - Attend all scheduled provider appointments - Monitor blood glucose levels consistently and utilize recommended interventions - Adhere to prescribed ADA/carb modified - Notify provider or care management team with questions and new concerns as needed   Follow Up Plan:  Will follow up next month    Patient Care Plan: Heart Failure (Adult)    Problem Identified: Symptom Exacerbation (Heart Failure)     Long-Range Goal: Symptom Exacerbation Prevented or Minimized   Start Date: 10/11/2020  Expected End Date: 02/08/2021  Priority: High  Note:    Wt Readings from Last 3  Encounters:  09/27/20 226 lb (102.5 kg)  05/30/20 227 lb (103 kg)  03/22/20 225 lb (102.1 kg)    Current Barriers:  . Chronic Disease Management support and educational needs r/t CHF.  Case Manager Clinical Goal(s):   Over the next 120 days, patient will not require hospitalization or emergent care d/t complications r/t CHF exacerbation.   Interventions:  . Collaboration with Birdie Sons, MD regarding development and update of comprehensive plan of care as evidenced by provider attestation and co-signature . Inter-disciplinary care team collaboration (see longitudinal plan of care) . Discussed current plan for CHF management. Encouraged to continue taking medications as prescribed. Encouraged to assess symptoms daily and contact clinic with concerns as needed. Encouraged to continue attempting to adhere to a cardiac prudent/heart healthy diet. Advised to closely monitor sodium consumption and avoid highly processed foods when possible. . Provided information regarding recommended weight parameters. Encouraged to weigh daily and record readings. Encouraged to notify provider for weight gain greater than 3 lbs overnight or weight gain greater than 5 lbs within a week. Reports weights have remained stable. Weight today was 220 lbs.  . Discussed s/sx of fluid overload and indications for notifying a provider. Reports his activity tolerance has declined over the past few months but overall feels his CHF if well managed. He monitors his oxygen saturations frequently and reports readings between 93-97%. Denies chest pain, palpitations, or shortness  of breath. Denies increased abdominal or lower extremity edema.  . Reviewed worsening s/sx related to CHF exacerbation and indications for seeking immediate medical attention.   Patient Goals/Self-Care Activities:  -Take all medications as prescribed -Monitor weight and record readings -Adhere to recommended cardiac prudent/heart healthy  diet -Notify provider for weight gain outside of established parameters -Contact provider or care management team with questions and new concerns as needed.   Follow Up Plan:  Will follow up next month      Patient Care Plan: Hypertension (Adult)    Problem Identified: Disease Progression (Hypertension)     Long-Range Goal: Disease Progression Prevented or Minimized   Start Date: 10/11/2020  Expected End Date: 02/08/2021  Priority: High  Note:   Objective:  . Last practice recorded BP readings:  BP Readings from Last 3 Encounters:  09/27/20 113/73  05/30/20 124/78  03/22/20 120/76 .   Marland Kitchen Most recent eGFR/CrCl: No results found for: EGFR  No components found for: CRCL   Current Barriers:  . Chronic Disease Management support and educational needs r/t Hypertension  Case Manager Clinical Goal(s):  Marland Kitchen Over the next 120 days, patient will demonstrate improved adherence to prescribed treatment plan as evidenced by taking all medications as prescribed, monitoring and recording blood pressure, and adhering to a cardiac prudent/heart healthy diet.  Interventions:  . Collaboration with Birdie Sons, MD regarding development and update of comprehensive plan of care as evidenced by provider attestation and co-signature . Inter-disciplinary care team collaboration (see longitudinal plan of care) . Reviewed medications and discussed current plan for HTN self-management. Reports starting Cardura and taking cardiac medications as prescribed. Attempting to comply with a heart healthy diet. . Provided information regarding established blood pressure parameters along with indications for notifying a provider. Monitors his BP regularly. Systolic readings have ranged from the 130's to 150's. Diastolic readings have ranged from the 60's to 70's. Encouraged to continue monitoring and notifying provider with elevated readings.  . Discussed complications of uncontrolled blood pressure. Reviewed s/sx of  heart attack, stroke and worsening symptoms that require immediate medical attention.     Patient Goals/Self-Care Activities: -Self-administer medications as prescribed -Attend all scheduled provider appointments -Monitor and record blood pressure -Adhere to recommended cardiac prudent/heart healthy diet -Notify provider or care management team with questions and new concerns as needed   Follow Up Plan:  Will follow up next month            Mr. Knoth was given information about Chronic Care Management services including:  1. CCM service includes personalized support from designated clinical staff supervised by his physician, including individualized plan of care and coordination with other care providers 2. 24/7 contact phone numbers for assistance for urgent and routine care needs. 3. Service will only be billed when office clinical staff spend 20 minutes or more in a month to coordinate care. 4. Only one practitioner may furnish and bill the service in a calendar month. 5. The patient may stop CCM services at any time (effective at the end of the month) by phone call to the office staff. 6. The patient will be responsible for cost sharing (co-pay) of up to 20% of the service fee (after annual deductible is met).  Patient agreed to services and verbal consent obtained.     Mr. Trefz verbalized understanding of the information discussed during the telephonic outreach today. Declined need for mailed/printed instructions. A member of the care management team will follow up with Mr. Peter next  month.    [facility] [phone]

## 2020-11-07 DIAGNOSIS — E119 Type 2 diabetes mellitus without complications: Secondary | ICD-10-CM | POA: Diagnosis not present

## 2020-11-07 DIAGNOSIS — M9931 Osseous stenosis of neural canal of cervical region: Secondary | ICD-10-CM | POA: Diagnosis not present

## 2020-11-07 DIAGNOSIS — M5412 Radiculopathy, cervical region: Secondary | ICD-10-CM | POA: Diagnosis not present

## 2020-11-07 DIAGNOSIS — M542 Cervicalgia: Secondary | ICD-10-CM | POA: Diagnosis not present

## 2020-11-09 DIAGNOSIS — L570 Actinic keratosis: Secondary | ICD-10-CM | POA: Diagnosis not present

## 2020-11-09 DIAGNOSIS — D485 Neoplasm of uncertain behavior of skin: Secondary | ICD-10-CM | POA: Diagnosis not present

## 2020-11-09 DIAGNOSIS — Z872 Personal history of diseases of the skin and subcutaneous tissue: Secondary | ICD-10-CM | POA: Diagnosis not present

## 2020-11-09 DIAGNOSIS — Z85820 Personal history of malignant melanoma of skin: Secondary | ICD-10-CM | POA: Diagnosis not present

## 2020-11-09 DIAGNOSIS — C44612 Basal cell carcinoma of skin of right upper limb, including shoulder: Secondary | ICD-10-CM | POA: Diagnosis not present

## 2020-11-09 DIAGNOSIS — L821 Other seborrheic keratosis: Secondary | ICD-10-CM | POA: Diagnosis not present

## 2020-11-09 DIAGNOSIS — Z85828 Personal history of other malignant neoplasm of skin: Secondary | ICD-10-CM | POA: Diagnosis not present

## 2020-11-09 DIAGNOSIS — Z859 Personal history of malignant neoplasm, unspecified: Secondary | ICD-10-CM | POA: Diagnosis not present

## 2020-11-09 DIAGNOSIS — L578 Other skin changes due to chronic exposure to nonionizing radiation: Secondary | ICD-10-CM | POA: Diagnosis not present

## 2020-11-15 ENCOUNTER — Ambulatory Visit (INDEPENDENT_AMBULATORY_CARE_PROVIDER_SITE_OTHER): Payer: Medicare Other

## 2020-11-15 DIAGNOSIS — I10 Essential (primary) hypertension: Secondary | ICD-10-CM

## 2020-11-15 DIAGNOSIS — Z9181 History of falling: Secondary | ICD-10-CM

## 2020-11-15 DIAGNOSIS — E1122 Type 2 diabetes mellitus with diabetic chronic kidney disease: Secondary | ICD-10-CM | POA: Diagnosis not present

## 2020-11-15 DIAGNOSIS — I5032 Chronic diastolic (congestive) heart failure: Secondary | ICD-10-CM | POA: Diagnosis not present

## 2020-11-15 NOTE — Chronic Care Management (AMB) (Signed)
Chronic Care Management   Follow Up Note   11/15/2020 Name: Bobbie Valletta. MRN: [id number] DOB: Jan 07, 1935   Primary Care Provider: Birdie Sons, MD Reason for referral : Chronic Care Management   Clive Parcel. is a 86 y.o. year old male who is a primary care patient of Caryn Section, Kirstie Peri, MD. He is currently enrolled in the Chronic Care Management program. A routine telephonic outreach was conducted today.  Review of Mr. Urieta status, including review of consultants reports, relevant labs and test results was conducted today. Collaboration with appropriate care team members was performed as part of the comprehensive evaluation and provision of chronic care management services.    SDOH (Social Determinants of Health) assessments performed: No   Outpatient Encounter Medications as of 11/15/2020  Medication Sig Note  . Accu-Chek FastClix Lancets MISC USE TO CHECK BLOOD SUGAR  DAILY   . aspirin 81 MG tablet Take 81 mg by mouth daily. 10/11/2020: Reports taking every other day.  Marland Kitchen azelastine (ASTELIN) 0.1 % nasal spray Place 2 sprays into both nostrils 2 (two) times daily. Use in each nostril as directed (Patient not taking: Reported on 10/11/2020)   . Blood Glucose Monitoring Suppl (ACCU-CHEK GUIDE) w/Device KIT USE AS DIRECTED DAILY   . doxazosin (CARDURA) 2 MG tablet TAKE 1 TABLET BY MOUTH  DAILY   . empagliflozin (JARDIANCE) 25 MG TABS tablet Take 1 tablet (25 mg total) by mouth daily.   . fluticasone (FLONASE) 50 MCG/ACT nasal spray USE 1 TO 2 SPRAYS IN BOTH  NOSTRILS DAILY   . furosemide (LASIX) 40 MG tablet TAKE 1 TABLET BY MOUTH  DAILY   . gabapentin (NEURONTIN) 300 MG capsule TAKE 1 CAPSULE BY MOUTH  TWICE DAILY   . glimepiride (AMARYL) 1 MG tablet Take 1 tablet (1 mg total) by mouth daily.   Marland Kitchen glucose blood (ACCU-CHEK GUIDE) test strip CHECK BLOOD SUGAR ONCE  DAILY FOR TYPE 2 DIABETES   . HYDROcodone-acetaminophen (NORCO/VICODIN) 5-325 MG tablet TAKE 1 TABLET BY MOUTH  TWICE DAILY AS NEEDED   . ibuprofen (ADVIL,MOTRIN) 200 MG tablet Take 200 mg by mouth every 6 (six) hours as needed. (Patient not taking: Reported on 10/11/2020)   . Lancets Misc. (ACCU-CHEK FASTCLIX LANCET) KIT Used to check glucose.  DX E11.9   . loratadine (CLARITIN) 10 MG tablet Take 10 mg by mouth daily.   Marland Kitchen losartan (COZAAR) 25 MG tablet Take 1 tablet (25 mg total) by mouth daily.   . meloxicam (MOBIC) 15 MG tablet TAKE 1 TABLET BY MOUTH  DAILY AS NEEDED   . metFORMIN (GLUCOPHAGE-XR) 500 MG 24 hr tablet Take 1 tablet (500 mg total) by mouth 2 (two) times daily.   . metoprolol tartrate (LOPRESSOR) 25 MG tablet TAKE 1 TABLET BY MOUTH  TWICE DAILY   . Multiple Vitamin (MULTIVITAMIN WITH MINERALS) TABS Take 1 tablet by mouth daily.   Marland Kitchen omeprazole (PRILOSEC) 40 MG capsule TAKE 1 CAPSULE BY MOUTH  DAILY   . oxybutynin (DITROPAN) 5 MG tablet TAKE 1 TABLET BY MOUTH  TWICE DAILY   . potassium chloride SA (KLOR-CON) 20 MEQ tablet TAKE 1 TABLET BY MOUTH  DAILY   . simvastatin (ZOCOR) 40 MG tablet TAKE 1 TABLET BY MOUTH AT  BEDTIME   . traZODone (DESYREL) 100 MG tablet TAKE 1/2 TO 1 TABLET BY  MOUTH AT BEDTIME AS NEEDED  FOR SLEEP    No facility-administered encounter medications on file as of 11/15/2020.  Objective:  Patient Care Plan: Fall Risk (Adult)    Problem Identified: Fall Risk     Long-Range Goal: Absence of Fall and Fall-Related Injury   Start Date: 10/11/2020  Expected End Date: 02/08/2021  Priority: High  Note:    Current Barriers:  . Risk for Falls d/t Impaired balance and gait.  Clinical Goal(s):  Marland Kitchen Over the next 120 days, patient will not experience falls or require emergent care d/t fall related injuries.   Interventions:  . Collaboration with Birdie Sons, MD regarding development and update of comprehensive plan of care as evidenced by provider attestation and co-signature . Inter-disciplinary care team collaboration (see longitudinal plan of care) . Reviewed  safety and fall prevention measures. Denies recent falls. Continues to use his Pember when ambulating. Encouraged to continue using caution when standing and changing positions.  . Reviewed ability to perform ADL's and tasks in the home. Continues to perform self-care independently. Continues to receive assistance with light housekeeping. Reports neighbors are still available to assist if needed. Advised to update the care management team if in-home assistance is needed.    Self-Care Deficits/Patient Goals: -Utilize assistive device appropriately with all ambulation -Ensure pathways are clear and well lit -Use caution when standing and changing positions -Wear secure fitting, skid free footwear when ambulating -Notify provider or care management team for questions and new concerns as needed   Follow Up Plan:  Will follow up next month     Patient Care Plan: Diabetes Type 2 (Adult)    Problem Identified: Glycemic Management (Diabetes, Type 2)     Long-Range Goal: Glycemic Management Optimized   Start Date: 10/11/2020  Expected End Date: 02/08/2021  Priority: High  Note:   Objective:  Lab Results  Component Value Date   HGBA1C 7.5 (A) 09/27/2020 .   Lab Results  Component Value Date   CREATININE 1.69 (H) 09/27/2020   CREATININE 1.83 (H) 05/30/2020   CREATININE 1.75 (H) 01/25/2020 .   Marland Kitchen No results found for: EGFR   Current Barriers:  . Chronic disease management support and educational needs related to Diabetes self-management   Case Manager Clinical Goal(s):  Marland Kitchen Over the next 120 days, patient will demonstrate improved adherence to prescribed treatment plan for Diabetes management as evidenced by taking medications as prescribed, monitoring and recording of CBG, and adherence to a ADA/ carb modified diet.   Interventions:  . Collaboration with Birdie Sons, MD regarding development and update of comprehensive plan of care as evidenced by provider attestation and  co-signature . Inter-disciplinary care team collaboration (see longitudinal plan of care) . Reviewed medications and compliance with diabetes treatment plan. Reports taking medications as prescribed but expressed concerns regarding the cost of Jardiance. Reports fasting blood glucose readings have ranged from 98 to 140 mg/dl over the past week. Recalls an elevated reading around 200 immediately after receiving a steroid injection. Reports levels were within range after about 2 days. He is attempting to adhere to a diabetic diet. . Reviewed s/sx of hypoglycemia and hyperglycemia along with recommended interventions.  . Reviewed recommended DM preventive care exams. Continues to complete recommended footcare. Scheduled for an eye exam in July. Marland Kitchen Referral submitted for Pharmacy outreach.   Patient Goals/Self-Care Activities -Self-administer medications as prescribed -Attend all scheduled provider appointments -Monitor blood glucose levels consistently and utilize recommended interventions -Adhere to prescribed ADA/carb modified -Complete outreach the CCM Pharmacist to address concerns regarding Jardiance -Notify provider or care management team with questions and new concerns  as needed   Follow Up Plan:  Will follow up next month   Patient Care Plan: Heart Failure (Adult)    Problem Identified: Symptom Exacerbation (Heart Failure)     Long-Range Goal: Symptom Exacerbation Prevented or Minimized   Start Date: 10/11/2020  Expected End Date: 02/08/2021  Priority: High  Note:    Current Barriers:  . Chronic Disease Management support and educational needs r/t CHF.  Case Manager Clinical Goal(s):  Marland Kitchen Over the next 120 days, patient will not require hospitalization or emergent care d/t complications r/t CHF exacerbation.   Interventions:  . Collaboration with Birdie Sons, MD regarding development and update of comprehensive plan of care as evidenced by provider attestation and  co-signature . Inter-disciplinary care team collaboration (see longitudinal plan of care) . Reviewed current plan for CHF management. Reports excellent compliance with medications. Reports weights have remained stable. Denies increased abdominal or lower extremity edema. He continues to experience exertional dyspnea but denies shortness of breath at rest. He is able to tolerate mild activity on the elliptical machine for 30 min. Currently exercising 5 days a week. Denies episode of chest pain or palpitations. Encouraged to continue activity as tolerated. Encouraged to continue monitoring and recording weights.  . Reviewed s/sx of fluid overload and indications for notifying a provider. . Reviewed worsening s/sx related to CHF exacerbation and indications for seeking immediate medical attention.   Patient Goals/Self-Care Activities:  -Take all medications as prescribed -Monitor weight and record readings -Adhere to recommended cardiac prudent/heart healthy diet -Notify provider for weight gain outside of established parameters -Contact provider or care management team with questions and new concerns as needed.   Follow Up Plan:  Will follow up next month   Patient Care Plan: Hypertension (Adult)    Problem Identified: Disease Progression (Hypertension)     Long-Range Goal: Disease Progression Prevented or Minimized   Start Date: 10/11/2020  Expected End Date: 02/08/2021  Priority: High  Note:   Objective:  . Last practice recorded BP readings:  BP Readings from Last 3 Encounters:  09/27/20 113/73  05/30/20 124/78  03/22/20 120/76 .   Marland Kitchen Most recent eGFR/CrCl: No results found for: EGFR  No components found for: CRCL   Current Barriers:  . Chronic Disease Management support and educational needs r/t Hypertension  Case Manager Clinical Goal(s):  Marland Kitchen Over the next 120 days, patient will demonstrate improved adherence to prescribed treatment plan as evidenced by taking all medications as  prescribed, monitoring and recording blood pressure, and adhering to a cardiac prudent/heart healthy diet.  Interventions:  . Collaboration with Birdie Sons, MD regarding development and update of comprehensive plan of care as evidenced by provider attestation and co-signature . Inter-disciplinary care team collaboration (see longitudinal plan of care) . Reviewed compliance with current plan for HTN self-management. Reports taking medications as prescribed. Attempting to comply with a heart healthy diet. Engages in mild activity for 30 minutes a day/5 days a week. Continues to monitor BP as recommended. Reviewed established BP ranges and indications for notifying a provider. Reports readings have been within range. Reading today was 140/86. Marland Kitchen Reviewed s/sx of heart attack, stroke and worsening symptoms that require immediate medical attention.   Patient Goals/Self-Care Activities: -Self-administer medications as prescribed -Attend all scheduled provider appointments -Monitor and record blood pressure -Adhere to recommended cardiac prudent/heart healthy diet -Notify provider or care management team with questions and new concerns as needed   Follow Up Plan:  Will follow up next month  PLAN A member of the care management team will follow up with Mr. Litsey next month.    [facility] Care [facility] [phone]

## 2020-11-16 ENCOUNTER — Telehealth: Payer: Self-pay | Admitting: *Deleted

## 2020-11-16 NOTE — Chronic Care Management (AMB) (Signed)
  Chronic Care Management   Note  11/16/2020 Name: Mark Terry. MRN: [id number] DOB: 28-Aug-1934  Mark Terry. is a 86 y.o. year old male who is a primary care patient of Caryn Section, Kirstie Peri, MD. Mark Terry. is currently enrolled in care management services. An additional referral for Pharmacy was placed.   Follow up plan: Unsuccessful telephone outreach attempt made.The care management team will reach out to the patient again over the next 7 days. If patient returns call to provider office, please advise to call [place] at [phone].  [facility]

## 2020-11-24 NOTE — Chronic Care Management (AMB) (Signed)
  Chronic Care Management   Note  11/24/2020 Name: Mark Terry. MRN: [id number] DOB: 06-08-1935  Mark Regal Joaquim Tolen. is a 86 y.o. year old male who is a primary care patient of Caryn Section, Kirstie Peri, MD. Mark Mathew. is currently enrolled in care management services. An additional referral for Pharmacy was placed.   Follow up plan: A second unsuccessful telephone outreach attempt made. A HIPAA compliant phone message was left for the patient providing contact information and requesting a return call.  The care management team will reach out to the patient again over the next 7 days.  If patient returns call to provider office, please advise to call [place] at [phone].  [facility] Management

## 2020-11-25 DIAGNOSIS — C44612 Basal cell carcinoma of skin of right upper limb, including shoulder: Secondary | ICD-10-CM | POA: Diagnosis not present

## 2020-11-27 NOTE — Patient Instructions (Signed)
Thank you for allowing the Chronic Care Management team to participate in your care. It was a pleasure speaking with you today. Please feel free to contact me with questions.   Goals Addressed: Patient Care Plan: Fall Risk (Adult)    Problem Identified: Fall Risk     Long-Range Goal: Absence of Fall and Fall-Related Injury   Start Date: 10/11/2020  Expected End Date: 02/08/2021  Priority: High  Note:    Current Barriers:  . Risk for Falls d/t Impaired balance and gait.  Clinical Goal(s):  Marland Kitchen Over the next 120 days, patient will not experience falls or require emergent care d/t fall related injuries.   Interventions:  . Collaboration with Birdie Sons, MD regarding development and update of comprehensive plan of care as evidenced by provider attestation and co-signature . Inter-disciplinary care team collaboration (see longitudinal plan of care) . Reviewed safety and fall prevention measures. Denies recent falls. Continues to use his [place] when ambulating. Encouraged to continue using caution when standing and changing positions.  . Reviewed ability to perform ADL's and tasks in the home. Continues to perform self-care independently. Continues to receive assistance with light housekeeping. Reports neighbors are still available to assist if needed. Advised to update the care management team if in-home assistance is needed.    Self-Care Deficits/Patient Goals: -Utilize assistive device appropriately with all ambulation -Ensure pathways are clear and well lit -Use caution when standing and changing positions -Wear secure fitting, skid free footwear when ambulating -Notify provider or care management team for questions and new concerns as needed   Follow Up Plan:  Will follow up next month     Patient Care Plan: Diabetes Type 2 (Adult)    Problem Identified: Glycemic Management (Diabetes, Type 2)     Long-Range Goal: Glycemic Management Optimized   Start Date: 10/11/2020   Expected End Date: 02/08/2021  Priority: High  Note:   Objective:  Lab Results  Component Value Date   HGBA1C 7.5 (A) 09/27/2020 .   Lab Results  Component Value Date   CREATININE 1.69 (H) 09/27/2020   CREATININE 1.83 (H) 05/30/2020   CREATININE 1.75 (H) 01/25/2020 .   Marland Kitchen No results found for: EGFR   Current Barriers:  . Chronic disease management support and educational needs related to Diabetes self-management   Case Manager Clinical Goal(s):  Marland Kitchen Over the next 120 days, patient will demonstrate improved adherence to prescribed treatment plan for Diabetes management as evidenced by taking medications as prescribed, monitoring and recording of CBG, and adherence to a ADA/ carb modified diet.   Interventions:  . Collaboration with Birdie Sons, MD regarding development and update of comprehensive plan of care as evidenced by provider attestation and co-signature . Inter-disciplinary care team collaboration (see longitudinal plan of care) . Reviewed medications and compliance with diabetes treatment plan. Reports taking medications as prescribed but expressed concerns regarding the cost of Jardiance. Reports fasting blood glucose readings have ranged from 98 to 140 mg/dl over the past week. Recalls an elevated reading around 200 immediately after receiving a steroid injection. Reports levels were within range after about 2 days. He is attempting to adhere to a diabetic diet. . Reviewed s/sx of hypoglycemia and hyperglycemia along with recommended interventions.  . Reviewed recommended DM preventive care exams. Continues to complete recommended footcare. Scheduled for an eye exam in July. Marland Kitchen Referral submitted for Pharmacy outreach.   Patient Goals/Self-Care Activities -Self-administer medications as prescribed -Attend all scheduled provider appointments -Monitor blood glucose levels  consistently and utilize recommended interventions -Adhere to prescribed ADA/carb  modified -Complete outreach the CCM Pharmacist to address concerns regarding Jardiance -Notify provider or care management team with questions and new concerns as needed   Follow Up Plan:  Will follow up next month   Patient Care Plan: Heart Failure (Adult)    Problem Identified: Symptom Exacerbation (Heart Failure)     Long-Range Goal: Symptom Exacerbation Prevented or Minimized   Start Date: 10/11/2020  Expected End Date: 02/08/2021  Priority: High  Note:    Current Barriers:  . Chronic Disease Management support and educational needs r/t CHF.  Case Manager Clinical Goal(s):  Marland Kitchen Over the next 120 days, patient will not require hospitalization or emergent care d/t complications r/t CHF exacerbation.   Interventions:  . Collaboration with Birdie Sons, MD regarding development and update of comprehensive plan of care as evidenced by provider attestation and co-signature . Inter-disciplinary care team collaboration (see longitudinal plan of care) . Reviewed current plan for CHF management. Reports excellent compliance with medications. Reports weights have remained stable. Denies increased abdominal or lower extremity edema. He continues to experience exertional dyspnea but denies shortness of breath at rest. He is able to tolerate mild activity on the elliptical machine for 30 min. Currently exercising 5 days a week. Denies episode of chest pain or palpitations. Encouraged to continue activity as tolerated. Encouraged to continue monitoring and recording weights.  . Reviewed s/sx of fluid overload and indications for notifying a provider. . Reviewed worsening s/sx related to CHF exacerbation and indications for seeking immediate medical attention.   Patient Goals/Self-Care Activities:  -Take all medications as prescribed -Monitor weight and record readings -Adhere to recommended cardiac prudent/heart healthy diet -Notify provider for weight gain outside of established  parameters -Contact provider or care management team with questions and new concerns as needed.   Follow Up Plan:  Will follow up next month   Patient Care Plan: Hypertension (Adult)    Problem Identified: Disease Progression (Hypertension)     Long-Range Goal: Disease Progression Prevented or Minimized   Start Date: 10/11/2020  Expected End Date: 02/08/2021  Priority: High  Note:   Objective:  . Last practice recorded BP readings:  BP Readings from Last 3 Encounters:  09/27/20 113/73  05/30/20 124/78  03/22/20 120/76 .   Marland Kitchen Most recent eGFR/CrCl: No results found for: EGFR  No components found for: CRCL   Current Barriers:  . Chronic Disease Management support and educational needs r/t Hypertension  Case Manager Clinical Goal(s):  Marland Kitchen Over the next 120 days, patient will demonstrate improved adherence to prescribed treatment plan as evidenced by taking all medications as prescribed, monitoring and recording blood pressure, and adhering to a cardiac prudent/heart healthy diet.  Interventions:  . Collaboration with Birdie Sons, MD regarding development and update of comprehensive plan of care as evidenced by provider attestation and co-signature . Inter-disciplinary care team collaboration (see longitudinal plan of care) . Reviewed compliance with current plan for HTN self-management. Reports taking medications as prescribed. Attempting to comply with a heart healthy diet. Engages in mild activity for 30 minutes a day/5 days a week. Continues to monitor BP as recommended. Reviewed established BP ranges and indications for notifying a provider. Reports readings have been within range. Reading today was 140/86. Marland Kitchen Reviewed s/sx of heart attack, stroke and worsening symptoms that require immediate medical attention.   Patient Goals/Self-Care Activities: -Self-administer medications as prescribed -Attend all scheduled provider appointments -Monitor and record blood pressure -  Adhere  to recommended cardiac prudent/heart healthy diet -Notify provider or care management team with questions and new concerns as needed   Follow Up Plan:  Will follow up next month            Mark Terry verbalized understanding of the information discussed during the telephonic outreach today. Declined need for mailed/printed instructions. A member of the care management team will follow up with Mark Terry next month.    [facility] Care Management [facility] [phone]

## 2020-11-29 ENCOUNTER — Telehealth: Payer: Self-pay

## 2020-11-29 NOTE — Chronic Care Management (AMB) (Addendum)
CCM Enrollment Received: Today Neldon Labella, RN  Yemariam Magar, Deatra Ina, NT; Germaine Pomfret, [facility] Good Afternoon!   Erline Levine I just spoke with Mr. Odwyer. You can leave him enrolled. He said he just has several things going on right now and was concerned that he still will not qualify for assistance.   Cristie Hem is it possible to have one of the techs mail the medication assistance forms for Jardiance. If so, please have the forms mailed out. If he decides to complete and return them, we can resubmit a request for pharmacy outreach.   Thank you both!!

## 2020-11-29 NOTE — Chronic Care Management (AMB) (Signed)
  Chronic Care Management   Note  11/29/2020 Name: [name]. MRN: [id number] DOB: 12/31/1934  [name]. is a 86 y.o. year old male who is a primary care patient of Caryn Section, Kirstie Peri, MD. Mordche Hedglin. is currently enrolled in care management services. An additional referral for Pharmacy was placed.   Follow up plan: Third unsuccessful telephone outreach attempt made. A HIPAA compliant phone message was left for the patient providing contact information and requesting a return call.  Unable to make contact on outreach attempts x 3. PCP [name] notified via routed documentation in medical record. The patient has been provided with contact information for the care management team and has been advised to call with any health related questions or concerns. If patient returns call to provider office, please advise to call Reyno at [place] Management

## 2020-11-29 NOTE — Chronic Care Management (AMB) (Signed)
  Care Management   Note  11/29/2020 Name: Mark Terry. MRN: [id number] DOB: 07/04/1935  Mark Terry. is a 86 y.o. year old male who is a primary care patient of Caryn Section, Kirstie Peri, MD and is actively engaged with the care management team. I reached out to Mark Terry. by phone today to assist with scheduling an initial visit with the Pharmacist  Follow up plan: Patient declines further follow up and engagement by the care management team. Appropriate care team members and provider have been notified via electronic communication. The care management team is available to follow up with the patient after provider conversation with the patient regarding recommendation for care management engagement and subsequent re-referral to the care management team.   [place] Management

## 2020-11-29 NOTE — Chronic Care Management (AMB) (Signed)
11/29/2020- Patient assistance application filled out for Jardiance with [place] patient assistance program, printed and mailed to patient.  Junius Argyle, CPP notified.  Pattricia Boss, [place] Pharmacist Assistant [phone]

## 2020-12-06 DIAGNOSIS — E7800 Pure hypercholesterolemia, unspecified: Secondary | ICD-10-CM | POA: Diagnosis not present

## 2020-12-06 DIAGNOSIS — E119 Type 2 diabetes mellitus without complications: Secondary | ICD-10-CM | POA: Diagnosis not present

## 2020-12-06 DIAGNOSIS — J449 Chronic obstructive pulmonary disease, unspecified: Secondary | ICD-10-CM | POA: Diagnosis not present

## 2020-12-06 DIAGNOSIS — Z951 Presence of aortocoronary bypass graft: Secondary | ICD-10-CM | POA: Diagnosis not present

## 2020-12-06 DIAGNOSIS — I5032 Chronic diastolic (congestive) heart failure: Secondary | ICD-10-CM | POA: Diagnosis not present

## 2020-12-06 DIAGNOSIS — I208 Other forms of angina pectoris: Secondary | ICD-10-CM | POA: Diagnosis not present

## 2020-12-06 DIAGNOSIS — G4733 Obstructive sleep apnea (adult) (pediatric): Secondary | ICD-10-CM | POA: Diagnosis not present

## 2020-12-06 DIAGNOSIS — I25118 Atherosclerotic heart disease of native coronary artery with other forms of angina pectoris: Secondary | ICD-10-CM | POA: Diagnosis not present

## 2020-12-06 DIAGNOSIS — R600 Localized edema: Secondary | ICD-10-CM | POA: Diagnosis not present

## 2020-12-06 DIAGNOSIS — N1832 Chronic kidney disease, stage 3b: Secondary | ICD-10-CM | POA: Diagnosis not present

## 2020-12-06 DIAGNOSIS — I10 Essential (primary) hypertension: Secondary | ICD-10-CM | POA: Diagnosis not present

## 2020-12-09 ENCOUNTER — Other Ambulatory Visit: Payer: Self-pay | Admitting: Family Medicine

## 2020-12-09 ENCOUNTER — Telehealth: Payer: Self-pay

## 2020-12-19 DIAGNOSIS — M5136 Other intervertebral disc degeneration, lumbar region: Secondary | ICD-10-CM | POA: Diagnosis not present

## 2020-12-19 DIAGNOSIS — M4802 Spinal stenosis, cervical region: Secondary | ICD-10-CM | POA: Diagnosis not present

## 2020-12-19 DIAGNOSIS — M5030 Other cervical disc degeneration, unspecified cervical region: Secondary | ICD-10-CM | POA: Diagnosis not present

## 2020-12-19 DIAGNOSIS — M5412 Radiculopathy, cervical region: Secondary | ICD-10-CM | POA: Diagnosis not present

## 2020-12-19 DIAGNOSIS — M48062 Spinal stenosis, lumbar region with neurogenic claudication: Secondary | ICD-10-CM | POA: Diagnosis not present

## 2020-12-19 DIAGNOSIS — M5416 Radiculopathy, lumbar region: Secondary | ICD-10-CM | POA: Diagnosis not present

## 2020-12-19 DIAGNOSIS — M5126 Other intervertebral disc displacement, lumbar region: Secondary | ICD-10-CM | POA: Diagnosis not present

## 2021-01-11 ENCOUNTER — Other Ambulatory Visit: Payer: Self-pay | Admitting: Family Medicine

## 2021-01-26 DIAGNOSIS — E119 Type 2 diabetes mellitus without complications: Secondary | ICD-10-CM | POA: Diagnosis not present

## 2021-01-26 LAB — HM DIABETES EYE EXAM

## 2021-02-01 ENCOUNTER — Ambulatory Visit: Payer: Self-pay | Admitting: Family Medicine

## 2021-02-03 ENCOUNTER — Ambulatory Visit (INDEPENDENT_AMBULATORY_CARE_PROVIDER_SITE_OTHER): Payer: Medicare Other

## 2021-02-03 ENCOUNTER — Other Ambulatory Visit: Payer: Self-pay | Admitting: Family Medicine

## 2021-02-03 DIAGNOSIS — I5032 Chronic diastolic (congestive) heart failure: Secondary | ICD-10-CM | POA: Diagnosis not present

## 2021-02-06 ENCOUNTER — Encounter: Payer: Self-pay | Admitting: Family Medicine

## 2021-02-06 ENCOUNTER — Ambulatory Visit (INDEPENDENT_AMBULATORY_CARE_PROVIDER_SITE_OTHER): Payer: Medicare Other | Admitting: Family Medicine

## 2021-02-06 ENCOUNTER — Other Ambulatory Visit: Payer: Self-pay

## 2021-02-06 VITALS — BP 136/80 | HR 82 | Temp 97.3°F | Resp 18 | Ht 60.0 in | Wt 225.0 lb

## 2021-02-06 DIAGNOSIS — N1832 Chronic kidney disease, stage 3b: Secondary | ICD-10-CM

## 2021-02-06 DIAGNOSIS — E1122 Type 2 diabetes mellitus with diabetic chronic kidney disease: Secondary | ICD-10-CM

## 2021-02-06 DIAGNOSIS — I5032 Chronic diastolic (congestive) heart failure: Secondary | ICD-10-CM

## 2021-02-06 LAB — POCT GLYCOSYLATED HEMOGLOBIN (HGB A1C)
Est. average glucose Bld gHb Est-mCnc: 151
Hemoglobin A1C: 6.9 % — AB (ref 4.0–5.6)

## 2021-02-06 NOTE — Chronic Care Management (AMB) (Signed)
  Chronic Care Management   CCM RN Visit Note   Name: Mark Terry. MRN: [id number] DOB: [birth year]  Subjective: Mark Terry. is a 86 y.o. year old male who is a primary care patient of Fisher, Kirstie Peri, MD. The care management team was consulted for assistance with disease management and care coordination needs.    Attempted to complete a follow up telephonic outreach in response to provider's referral for case management and/or care coordination services. Unable to complete outreach due to multiple call drops.   PLAN Pending return call. A member of the care management team will follow up within the next month or sooner if patient returns call.   [facility] [phone]

## 2021-02-06 NOTE — Progress Notes (Signed)
I,Mark Terry,acting as a scribe for [name], MD.,have documented all relevant documentation on the behalf of [name], MD,as directed by  [name], MD while in the presence of [name], MD.   Established patient visit   Patient: Mark Terry.   DOB: 03-10-1935   86 y.o. Male  MRN: [id number] Visit Date: 02/06/2021  Today's healthcare provider: [name], MD   Chief Complaint  Patient presents with   Follow-up   Diabetes   Hypertension   Subjective    HPI  Diabetes Mellitus Type II, follow-up  Lab Results  Component Value Date   HGBA1C 6.9 (A) 02/06/2021   HGBA1C 7.5 (A) 09/27/2020   HGBA1C 7.4 (A) 05/30/2020   Last seen for diabetes 4 months ago.  Management since then includes; Sugars are stable. See how GFR looks on labs below. Consider nephrology referral. Referred to CCM. He reports good compliance with treatment. He is not having side effects. none  Home blood sugar records: fasting range: is checking  Episodes of hypoglycemia? No none   Current insulin regiment: n/a Most Recent Eye Exam: 01/26/2021  --------------------------------------------------------------------------------------------------- Hypertension, follow-up  BP Readings from Last 3 Encounters:  02/06/21 136/80  09/27/20 113/73  05/30/20 124/78   Wt Readings from Last 3 Encounters:  02/06/21 225 lb (102.1 kg)  09/27/20 226 lb (102.5 kg)  05/30/20 227 lb (103 kg)     He was last seen for hypertension 4 months ago.  BP at that visit was 113/73. Management since that visit includes; labs checked-started losartan 25 mg for CKD He reports good compliance with treatment. He is not having side effects. none He is not exercising. He is adherent to low salt diet.   Outside blood pressures are 150/90.  He does not smoke.  Use of agents associated with hypertension: none.    ---------------------------------------------------------------------------------------------------      Medications: Outpatient Medications Prior to Visit  Medication Sig   Accu-Chek FastClix Lancets MISC USE TO CHECK BLOOD SUGAR  DAILY   aspirin 81 MG tablet Take 81 mg by mouth daily.   Blood Glucose Monitoring Suppl (ACCU-CHEK GUIDE) w/Device KIT USE AS DIRECTED DAILY   doxazosin (CARDURA) 2 MG tablet TAKE 1 TABLET BY MOUTH  DAILY   empagliflozin (JARDIANCE) 25 MG TABS tablet Take 1 tablet (25 mg total) by mouth daily.   fluticasone (FLONASE) 50 MCG/ACT nasal spray USE 1 TO 2 SPRAYS IN BOTH  NOSTRILS DAILY   furosemide (LASIX) 40 MG tablet TAKE 1 TABLET BY MOUTH  DAILY   gabapentin (NEURONTIN) 300 MG capsule TAKE 1 CAPSULE BY MOUTH  TWICE DAILY   glimepiride (AMARYL) 1 MG tablet Take 1 tablet (1 mg total) by mouth daily.   glucose blood (ACCU-CHEK GUIDE) test strip CHECK BLOOD SUGAR ONCE  DAILY FOR TYPE 2 DIABETES   HYDROcodone-acetaminophen (NORCO/VICODIN) 5-325 MG tablet TAKE 1 TABLET BY MOUTH TWICE DAILY AS NEEDED   Lancets Misc. (ACCU-CHEK FASTCLIX LANCET) KIT Used to check glucose.  DX E11.9   loratadine (CLARITIN) 10 MG tablet Take 10 mg by mouth daily.   losartan (COZAAR) 25 MG tablet Take 1 tablet (25 mg total) by mouth daily.   meloxicam (MOBIC) 15 MG tablet TAKE 1 TABLET BY MOUTH  DAILY AS NEEDED   metFORMIN (GLUCOPHAGE-XR) 500 MG 24 hr tablet Take 1 tablet (500 mg total) by mouth 2 (two) times daily.   metoprolol tartrate (LOPRESSOR) 25 MG tablet TAKE 1 TABLET BY MOUTH  TWICE DAILY  Multiple Vitamin (MULTIVITAMIN WITH MINERALS) TABS Take 1 tablet by mouth daily.   omeprazole (PRILOSEC) 40 MG capsule TAKE 1 CAPSULE BY MOUTH  DAILY   oxybutynin (DITROPAN) 5 MG tablet TAKE 1 TABLET BY MOUTH  TWICE DAILY   potassium chloride SA (KLOR-CON) 20 MEQ tablet TAKE 1 TABLET BY MOUTH  DAILY   simvastatin (ZOCOR) 40 MG tablet TAKE 1 TABLET BY MOUTH AT  BEDTIME   traZODone (DESYREL)  100 MG tablet TAKE 1/2 TO 1 TABLET BY  MOUTH AT BEDTIME AS NEEDED  FOR SLEEP   azelastine (ASTELIN) 0.1 % nasal spray Place 2 sprays into both nostrils 2 (two) times daily. Use in each nostril as directed (Patient not taking: No sig reported)   ibuprofen (ADVIL,MOTRIN) 200 MG tablet Take 200 mg by mouth every 6 (six) hours as needed. (Patient not taking: No sig reported)   No facility-administered medications prior to visit.    Review of Systems  Constitutional:  Negative for appetite change, chills and fever.  Respiratory:  Negative for chest tightness, shortness of breath and wheezing.   Cardiovascular:  Negative for chest pain and palpitations.  Gastrointestinal:  Negative for abdominal pain, nausea and vomiting.      Objective    BP 136/80 (BP Location: Right Arm, Patient Position: Sitting, Cuff Size: Large)   Pulse 82   Temp (!) 97.3 F (36.3 C) (Temporal)   Resp 18   Ht 5' (1.524 m)   Wt 225 lb (102.1 kg)   SpO2 97%   BMI 43.94 kg/m     Physical Exam   General: Appearance:    Severely obese male in no acute distress  Eyes:    PERRL, conjunctiva/corneas clear, EOM's intact       Lungs:     Clear to auscultation bilaterally, respirations unlabored  Heart:    Normal heart rate. Normal rhythm. No murmurs, rubs, or gallops.    MS:   All extremities are intact.    Neurologic:   Awake, alert, oriented x 3. No apparent focal neurological defect.        Results for orders placed or performed in visit on 02/06/21  POCT glycosylated hemoglobin (Hb A1C)  Result Value Ref Range   Hemoglobin A1C 6.9 (A) 4.0 - 5.6 %   Est. average glucose Bld gHb Est-mCnc 151     Assessment & Plan     1. Type 2 diabetes mellitus with chronic kidney disease, without long-term current use of insulin, unspecified CKD stage ([place]) Very well controlled. Continue current medications.    2. Chronic diastolic congestive heart failure (HCC) Asymptomatic. Compliant with medication.  Continue aggressive  risk factor modification.  Continue routine follow up Dr. Clayborn Bigness.   3. Chronic kidney disease, stage 3b ([place]) Tolerating addition of low dose losartan, but home blood pressures are high.    - Renal function panel Consider medication adjustment for better BP control after reviewing labs.   Future Appointments  Date Time Provider [place]  06/14/2021 10:00 AM Caryn Section, Kirstie Peri, MD [facility]        The entirety of the information documented in the History of Present Illness, Review of Systems and Physical Exam were personally obtained by me. Portions of this information were initially documented by the CMA and reviewed by me for thoroughness and accuracy.     [name], MD  [facility] [phone] (phone) [phone] (fax)  [place]

## 2021-02-06 NOTE — Patient Instructions (Signed)
Please review the attached list of medications and notify my office if there are any errors.   Please go to the lab draw station in [address] on the second floor of [facility]. Normal hours are 8:00am to 11:30am and 1:00pm to 4:00pm Monday through Friday  

## 2021-02-07 DIAGNOSIS — M48062 Spinal stenosis, lumbar region with neurogenic claudication: Secondary | ICD-10-CM | POA: Diagnosis not present

## 2021-02-07 DIAGNOSIS — M5416 Radiculopathy, lumbar region: Secondary | ICD-10-CM | POA: Diagnosis not present

## 2021-02-08 DIAGNOSIS — E1122 Type 2 diabetes mellitus with diabetic chronic kidney disease: Secondary | ICD-10-CM | POA: Diagnosis not present

## 2021-02-09 ENCOUNTER — Other Ambulatory Visit: Payer: Self-pay | Admitting: Family Medicine

## 2021-02-09 LAB — RENAL FUNCTION PANEL
Albumin: 4.6 g/dL (ref 3.6–4.6)
BUN/Creatinine Ratio: 16 (ref 10–24)
BUN: 30 mg/dL — ABNORMAL HIGH (ref 8–27)
CO2: 20 mmol/L (ref 20–29)
Calcium: 10.1 mg/dL (ref 8.6–10.2)
Chloride: 101 mmol/L (ref 96–106)
Creatinine, Ser: 1.86 mg/dL — ABNORMAL HIGH (ref 0.76–1.27)
Glucose: 116 mg/dL — ABNORMAL HIGH (ref 65–99)
Phosphorus: 3.3 mg/dL (ref 2.8–4.1)
Potassium: 4.5 mmol/L (ref 3.5–5.2)
Sodium: 140 mmol/L (ref 134–144)
eGFR: 35 mL/min/{1.73_m2} — ABNORMAL LOW (ref >59)

## 2021-02-09 MED ORDER — LOSARTAN POTASSIUM 50 MG PO TABS: 50.0000 mg | ORAL_TABLET | Freq: Every day | ORAL | 1 refills | Status: DC

## 2021-02-10 ENCOUNTER — Other Ambulatory Visit: Payer: Self-pay | Admitting: *Deleted

## 2021-02-10 MED ORDER — LOSARTAN POTASSIUM 50 MG PO TABS: 50.0000 mg | ORAL_TABLET | Freq: Every day | ORAL | 1 refills | Status: DC

## 2021-02-24 ENCOUNTER — Other Ambulatory Visit: Payer: Self-pay | Admitting: Family Medicine

## 2021-02-24 DIAGNOSIS — M5116 Intervertebral disc disorders with radiculopathy, lumbar region: Secondary | ICD-10-CM

## 2021-02-24 DIAGNOSIS — N3941 Urge incontinence: Secondary | ICD-10-CM

## 2021-02-24 DIAGNOSIS — E1121 Type 2 diabetes mellitus with diabetic nephropathy: Secondary | ICD-10-CM

## 2021-03-16 DIAGNOSIS — Z9989 Dependence on other enabling machines and devices: Secondary | ICD-10-CM | POA: Diagnosis not present

## 2021-03-16 DIAGNOSIS — G4733 Obstructive sleep apnea (adult) (pediatric): Secondary | ICD-10-CM | POA: Diagnosis not present

## 2021-03-17 ENCOUNTER — Ambulatory Visit (INDEPENDENT_AMBULATORY_CARE_PROVIDER_SITE_OTHER): Payer: Medicare Other

## 2021-03-17 DIAGNOSIS — I5032 Chronic diastolic (congestive) heart failure: Secondary | ICD-10-CM

## 2021-03-17 DIAGNOSIS — E1122 Type 2 diabetes mellitus with diabetic chronic kidney disease: Secondary | ICD-10-CM

## 2021-03-17 DIAGNOSIS — I10 Essential (primary) hypertension: Secondary | ICD-10-CM

## 2021-03-17 DIAGNOSIS — Z9181 History of falling: Secondary | ICD-10-CM

## 2021-03-17 NOTE — Patient Instructions (Addendum)
Thank you for allowing the Chronic Care Management team to participate in your care.    Patient Care Plan: Fall Risk (Adult)  Completed 03/17/2021   Problem Identified: Fall Risk Resolved 03/17/2021     Long-Range Goal: Absence of Fall and Fall-Related Injury Completed 03/17/2021  Start Date: 10/11/2020  Expected End Date: 02/08/2021  Priority: High  Note:    Current Barriers:  Risk for Falls d/t Impaired balance and gait.  Clinical Goal(s):  Over the next 120 days, patient will not experience falls or require emergent care d/t fall related injuries.   Interventions:  Collaboration with Birdie Sons, MD regarding development and update of comprehensive plan of care as evidenced by provider attestation and co-signature Inter-disciplinary care team collaboration (see longitudinal plan of care) Reviewed safety and fall prevention measures. Denies recent falls. Reports mostly using a [place] or wheelchair. Declined need for Home Health therapy. He is being followed by Dr. Thedora [facility]. Reports a pending follow up next week. Reviewed ability to perform ADL's and tasks in the home. Continues to perform self-care independently with friends and neighbors assisting as needed. Declined need for additional assistance in the home.     Self-Care Deficits/Patient Goals: Utilize assistive device appropriately with all ambulation Ensure pathways are clear and well lit Use caution when standing and changing positions Wear secure fitting, skid free footwear when ambulating Complete outreach with the Physical Medicine team as scheduled   Goal Met      Patient Care Plan: Diabetes Type 2 (Adult)  Completed 03/17/2021   Problem Identified: Glycemic Management (Diabetes, Type 2) Resolved 03/17/2021     Long-Range Goal: Glycemic Management Optimized Completed 03/17/2021  Start Date: 10/11/2020  Expected End Date: 02/08/2021  Priority: High  Note:    Current Barriers:  Chronic disease  management support and educational needs related to Diabetes self-management   Case Manager Clinical Goal(s):  Over the next 120 days, patient will demonstrate improved adherence to prescribed treatment plan for Diabetes management as evidenced by taking medications as prescribed, monitoring and recording of CBG, and adherence to a ADA/ carb modified diet.   Interventions:  Collaboration with Birdie Sons, MD regarding development and update of comprehensive plan of care as evidenced by provider attestation and co-signature Inter-disciplinary care team collaboration (see longitudinal plan of care) Reviewed medications and compliance with diabetes treatment plan. Reports compliance with treatment plan. Reports fasting blood glucose readings have been within range. Denies experiencing s/sx of hypoglycemia or hyperglycemia since the last outreach. Declines need for additional nutritional/diabetic education resources.    Patient Goals/Self-Care Activities Self-administer medications as prescribed Attend all scheduled provider appointments Monitor blood glucose levels consistently and utilize recommended interventions Adhere to prescribed ADA/carb modified Notify provider with questions and new concerns if needed   Goal Met    Patient Care Plan: Heart Failure (Adult)  Completed 03/17/2021   Problem Identified: Symptom Exacerbation (Heart Failure) Resolved 03/17/2021     Long-Range Goal: Symptom Exacerbation Prevented or Minimized Completed 03/17/2021  Start Date: 10/11/2020  Expected End Date: 02/08/2021  Priority: High  Note:    Current Barriers:  Chronic Disease Management support and educational needs r/t CHF.  Case Manager Clinical Goal(s):  Over the next 120 days, patient will not require hospitalization or emergent care d/t complications r/t CHF exacerbation.   Interventions:  Collaboration with Birdie Sons, MD regarding development and update of comprehensive plan of  care as evidenced by provider attestation and co-signature Inter-disciplinary care team collaboration (see longitudinal  plan of care) Reviewed current plan for CHF management. Reports excellent compliance with medications. Reports weights have been within range.  Denies increased abdominal or lower extremity edema. Denies shortness of breath at rest. Reports decreased activity tolerance due to chronic back and hip pain.  Reviewed worsening s/sx related to CHF exacerbation and indications for seeking immediate medical attention.   Patient Goals/Self-Care Activities:  Take all medications as prescribed Monitor weight and record readings Adhere to recommended cardiac prudent/heart healthy diet Notify provider for weight gain outside of established parameters Contact provider or care management team with questions and new concerns as needed.   Goal Met    Patient Care Plan: Hypertension (Adult)  Completed 03/17/2021   Problem Identified: Disease Progression (Hypertension) Resolved 03/17/2021     Long-Range Goal: Disease Progression Prevented or Minimized Completed 03/17/2021  Start Date: 10/11/2020  Expected End Date: 02/08/2021  Priority: High  Note:    Current Barriers:  Chronic Disease Management support and educational needs r/t Hypertension  Case Manager Clinical Goal(s):  Over the next 120 days, patient will demonstrate improved adherence to prescribed treatment plan as evidenced by taking all medications as prescribed, monitoring and recording blood pressure, and adhering to a cardiac prudent/heart healthy diet.  Interventions:  Collaboration with Birdie Sons, MD regarding development and update of comprehensive plan of care as evidenced by provider attestation and co-signature Inter-disciplinary care team collaboration (see longitudinal plan of care) Reviewed compliance with current plan for HTN self-management. Reports taking medications as prescribed and attempting to adhere to  prescribed diet. Reports blood pressure readings have been within the established range.  Reviewed s/sx of heart attack, stroke and worsening symptoms that require immediate medical attention.   Patient Goals/Self-Care Activities: Self-administer medications as prescribed Attend all scheduled provider appointments Monitor and record blood pressure Adhere to recommended cardiac prudent/heart healthy diet Notify provider with questions and new concerns as needed   Goal Met        Mr. Podgorski verbalized understanding of the information discussed during the telephonic outreach. Declined need for mailed/printed instructions. He has met his care management goals. No further outreach required.    [facility]rist[facility] Care Management [facility] [phone]

## 2021-03-17 NOTE — Chronic Care Management (AMB) (Signed)
Chronic Care Management   CCM RN Visit Note  03/17/2021 Name: Mark Terry. MRN: [id number] DOB: 09/11/1934  Subjective: Mark Terry. is a 86 y.o. year old male who is a primary care patient of Fisher, Kirstie Peri, MD. The care management team was consulted for assistance with disease management and care coordination needs.    Engaged with patient by telephone for follow up visit in response to provider referral for case management and care coordination services.   Consent to Services:  The patient was given information about Chronic Care Management services, agreed to services, and gave verbal consent prior to initiation of services.  Please see initial visit note for detailed documentation.    Assessment: Review of patient past medical history, allergies, medications, health status, including review of consultants reports, laboratory and other test data, was performed as part of comprehensive evaluation and provision of chronic care management services.   SDOH (Social Determinants of Health) assessments and interventions performed:  No  CCM Care Plan  Allergies  Allergen Reactions   Propoxyphene Anaphylaxis   Codeine Sulfate     Patient denies   Darvon [Propoxyphene Hcl] Other (See Comments)    Short of breath   Demerol [Meperidine] Other (See Comments)    Short of breath   Oxycodone Nausea Only    Outpatient Encounter Medications as of 03/17/2021  Medication Sig Note   Accu-Chek FastClix Lancets MISC USE TO CHECK BLOOD SUGAR  DAILY    aspirin 81 MG tablet Take 81 mg by mouth daily. 10/11/2020: Reports taking every other day.   azelastine (ASTELIN) 0.1 % nasal spray Place 2 sprays into both nostrils 2 (two) times daily. Use in each nostril as directed (Patient not taking: No sig reported)    Blood Glucose Monitoring Suppl (ACCU-CHEK GUIDE) w/Device KIT USE AS DIRECTED DAILY    doxazosin (CARDURA) 2 MG tablet TAKE 1 TABLET BY MOUTH  DAILY    empagliflozin (JARDIANCE) 25  MG TABS tablet Take 1 tablet (25 mg total) by mouth daily.    fluticasone (FLONASE) 50 MCG/ACT nasal spray USE 1 TO 2 SPRAYS IN BOTH  NOSTRILS DAILY    furosemide (LASIX) 40 MG tablet TAKE 1 TABLET BY MOUTH  DAILY    gabapentin (NEURONTIN) 300 MG capsule TAKE 1 CAPSULE BY MOUTH  TWICE DAILY    glimepiride (AMARYL) 1 MG tablet TAKE 1 TABLET BY MOUTH  DAILY    glucose blood (ACCU-CHEK GUIDE) test strip CHECK BLOOD SUGAR ONCE  DAILY FOR TYPE 2 DIABETES    HYDROcodone-acetaminophen (NORCO/VICODIN) 5-325 MG tablet TAKE 1 TABLET BY MOUTH TWICE DAILY AS NEEDED    ibuprofen (ADVIL,MOTRIN) 200 MG tablet Take 200 mg by mouth every 6 (six) hours as needed. (Patient not taking: No sig reported)    Lancets Misc. (ACCU-CHEK FASTCLIX LANCET) KIT Used to check glucose.  DX E11.9    loratadine (CLARITIN) 10 MG tablet Take 10 mg by mouth daily.    losartan (COZAAR) 50 MG tablet Take 1 tablet (50 mg total) by mouth daily.    meloxicam (MOBIC) 15 MG tablet TAKE 1 TABLET BY MOUTH  DAILY AS NEEDED    metFORMIN (GLUCOPHAGE-XR) 500 MG 24 hr tablet Take 1 tablet (500 mg total) by mouth 2 (two) times daily.    metoprolol tartrate (LOPRESSOR) 25 MG tablet TAKE 1 TABLET BY MOUTH  TWICE DAILY    Multiple Vitamin (MULTIVITAMIN WITH MINERALS) TABS Take 1 tablet by mouth daily.    omeprazole (PRILOSEC) 40  MG capsule TAKE 1 CAPSULE BY MOUTH  DAILY    oxybutynin (DITROPAN) 5 MG tablet TAKE 1 TABLET BY MOUTH  TWICE DAILY    potassium chloride SA (KLOR-CON) 20 MEQ tablet TAKE 1 TABLET BY MOUTH  DAILY    simvastatin (ZOCOR) 40 MG tablet TAKE 1 TABLET BY MOUTH AT  BEDTIME    traZODone (DESYREL) 100 MG tablet TAKE 1/2 TO 1 TABLET BY  MOUTH AT BEDTIME AS NEEDED  FOR SLEEP    No facility-administered encounter medications on file as of 03/17/2021.    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   Aortic atherosclerosis ([place]) 09/22/2019   Squamous cell carcinoma of forehead  05/12/2019   Anosmia 01/19/2019   Chronic pain syndrome 05/29/2018   Lumbar radiculopathy 05/29/2018   History of lumbar laminectomy for spinal cord decompression (L2/3) 05/29/2018   Chronic kidney disease, stage 3b ([place]) 05/21/2018   Dysphagia 09/10/2017   Primary osteoarthritis of left hip 04/30/2016   Urge incontinence 12/15/2015   Nocturia 12/15/2015   Urinary frequency 12/15/2015   Arthritis of ankle or foot, degenerative 11/04/2015   Status post total replacement of right hip 04/25/2015   Allergic rhinitis 12/20/2014   Arthritis 12/20/2014   At risk for falling 12/20/2014   Back pain with radiation 12/20/2014   BPH (benign prostatic hyperplasia) 12/20/2014   Carpal tunnel syndrome 12/20/2014   CHF (congestive heart failure) ([place]) 12/20/2014   Fatty infiltration of liver 12/20/2014   History of colonic polyps 12/20/2014   Cervical pain 12/20/2014   Thrombocytopenia ([place]) 12/20/2014   Arthritis of ankle, right 12/20/2014   Varus foot deformity, acquired 12/20/2014   Insomnia    Cervical spinal stenosis 04/28/2014   DDD (degenerative disc disease), cervical 04/28/2014   Cervical radiculitis 04/01/2014   Neuritis or radiculitis due to rupture of lumbar intervertebral disc 12/21/2013   DDD (degenerative disc disease), lumbar 12/21/2013   Lumbar spondylosis 12/21/2013   Chronic gastritis 12/21/2010   Pilonidal cyst 09/05/2009   OSA on CPAP 05/07/2005   Diabetes mellitus with nephropathy ([place]) 04/22/2003   CAD (coronary artery disease) 10/05/1999   Essential (primary) hypertension 09/06/1998   Arthropathia 03/25/1996   Hypercholesterolemia 03/25/1996   Morbid obesity ([place]) 09/21/1994   GERD (gastroesophageal reflux disease) 08/10/1994    Conditions to be addressed/monitored:CHF, HTN, DMII and Fall Risk  Patient Care Plan: Fall Risk (Adult)  Completed 03/17/2021   Problem Identified: Fall Risk Resolved 03/17/2021     Long-Range Goal: Absence of Fall and Fall-Related  Injury Completed 03/17/2021  Start Date: 10/11/2020  Expected End Date: 02/08/2021  Priority: High  Note:    Current Barriers:  Risk for Falls d/t Impaired balance and gait.  Clinical Goal(s):  Over the next 120 days, patient will not experience falls or require emergent care d/t fall related injuries.   Interventions:  Collaboration with Birdie Sons, MD regarding development and update of comprehensive plan of care as evidenced by provider attestation and co-signature Inter-disciplinary care team collaboration (see longitudinal plan of care) Reviewed safety and fall prevention measures. Denies recent falls. Reports mostly using a [place] or wheelchair. Declined need for Home Health therapy. He is being followed by Dr. Thedora [facility]. Reports a pending follow up next week. Reviewed ability to perform ADL's and tasks in the home. Continues to perform self-care independently with friends and neighbors assisting as needed. Declined need for additional assistance in the home.     Self-Care Deficits/Patient  Goals: Utilize assistive device appropriately with all ambulation Ensure pathways are clear and well lit Use caution when standing and changing positions Wear secure fitting, skid free footwear when ambulating Complete outreach with the Physical Medicine team as scheduled   Goal Met      Patient Care Plan: Diabetes Type 2 (Adult)  Completed 03/17/2021   Problem Identified: Glycemic Management (Diabetes, Type 2) Resolved 03/17/2021     Long-Range Goal: Glycemic Management Optimized Completed 03/17/2021  Start Date: 10/11/2020  Expected End Date: 02/08/2021  Priority: High  Note:    Current Barriers:  Chronic disease management support and educational needs related to Diabetes self-management   Case Manager Clinical Goal(s):  Over the next 120 days, patient will demonstrate improved adherence to prescribed treatment plan for Diabetes management as evidenced by taking  medications as prescribed, monitoring and recording of CBG, and adherence to a ADA/ carb modified diet.   Interventions:  Collaboration with Birdie Sons, MD regarding development and update of comprehensive plan of care as evidenced by provider attestation and co-signature Inter-disciplinary care team collaboration (see longitudinal plan of care) Reviewed medications and compliance with diabetes treatment plan. Reports compliance with treatment plan. Reports fasting blood glucose readings have been within range. Denies experiencing s/sx of hypoglycemia or hyperglycemia since the last outreach. Declines need for additional nutritional/diabetic education resources.    Patient Goals/Self-Care Activities Self-administer medications as prescribed Attend all scheduled provider appointments Monitor blood glucose levels consistently and utilize recommended interventions Adhere to prescribed ADA/carb modified Notify provider with questions and new concerns if needed   Goal Met    Patient Care Plan: Heart Failure (Adult)  Completed 03/17/2021   Problem Identified: Symptom Exacerbation (Heart Failure) Resolved 03/17/2021     Long-Range Goal: Symptom Exacerbation Prevented or Minimized Completed 03/17/2021  Start Date: 10/11/2020  Expected End Date: 02/08/2021  Priority: High  Note:    Current Barriers:  Chronic Disease Management support and educational needs r/t CHF.  Case Manager Clinical Goal(s):  Over the next 120 days, patient will not require hospitalization or emergent care d/t complications r/t CHF exacerbation.   Interventions:  Collaboration with Birdie Sons, MD regarding development and update of comprehensive plan of care as evidenced by provider attestation and co-signature Inter-disciplinary care team collaboration (see longitudinal plan of care) Reviewed current plan for CHF management. Reports excellent compliance with medications. Reports weights have been within  range.  Denies increased abdominal or lower extremity edema. Denies shortness of breath at rest. Reports decreased activity tolerance due to chronic back and hip pain.  Reviewed worsening s/sx related to CHF exacerbation and indications for seeking immediate medical attention.   Patient Goals/Self-Care Activities:  Take all medications as prescribed Monitor weight and record readings Adhere to recommended cardiac prudent/heart healthy diet Notify provider for weight gain outside of established parameters Contact provider or care management team with questions and new concerns as needed.   Goal Met    Patient Care Plan: Hypertension (Adult)  Completed 03/17/2021   Problem Identified: Disease Progression (Hypertension) Resolved 03/17/2021     Long-Range Goal: Disease Progression Prevented or Minimized Completed 03/17/2021  Start Date: 10/11/2020  Expected End Date: 02/08/2021  Priority: High  Note:    Current Barriers:  Chronic Disease Management support and educational needs r/t Hypertension  Case Manager Clinical Goal(s):  Over the next 120 days, patient will demonstrate improved adherence to prescribed treatment plan as evidenced by taking all medications as prescribed, monitoring and recording blood pressure, and adhering  to a cardiac prudent/heart healthy diet.  Interventions:  Collaboration with Birdie Sons, MD regarding development and update of comprehensive plan of care as evidenced by provider attestation and co-signature Inter-disciplinary care team collaboration (see longitudinal plan of care) Reviewed compliance with current plan for HTN self-management. Reports taking medications as prescribed and attempting to adhere to prescribed diet. Reports blood pressure readings have been within the established range.  Reviewed s/sx of heart attack, stroke and worsening symptoms that require immediate medical attention.   Patient Goals/Self-Care Activities: Self-administer  medications as prescribed Attend all scheduled provider appointments Monitor and record blood pressure Adhere to recommended cardiac prudent/heart healthy diet Notify provider with questions and new concerns as needed   Goal Met       PLAN No further outreach required.    Cristy [facility] Care Management [facility] [phone]

## 2021-03-20 DIAGNOSIS — M5126 Other intervertebral disc displacement, lumbar region: Secondary | ICD-10-CM | POA: Diagnosis not present

## 2021-03-20 DIAGNOSIS — M5412 Radiculopathy, cervical region: Secondary | ICD-10-CM | POA: Diagnosis not present

## 2021-03-20 DIAGNOSIS — M4802 Spinal stenosis, cervical region: Secondary | ICD-10-CM | POA: Diagnosis not present

## 2021-03-20 DIAGNOSIS — M7061 Trochanteric bursitis, right hip: Secondary | ICD-10-CM | POA: Diagnosis not present

## 2021-03-20 DIAGNOSIS — M48062 Spinal stenosis, lumbar region with neurogenic claudication: Secondary | ICD-10-CM | POA: Diagnosis not present

## 2021-03-20 DIAGNOSIS — M5030 Other cervical disc degeneration, unspecified cervical region: Secondary | ICD-10-CM | POA: Diagnosis not present

## 2021-03-20 DIAGNOSIS — M5136 Other intervertebral disc degeneration, lumbar region: Secondary | ICD-10-CM | POA: Diagnosis not present

## 2021-03-20 DIAGNOSIS — M5416 Radiculopathy, lumbar region: Secondary | ICD-10-CM | POA: Diagnosis not present

## 2021-03-24 ENCOUNTER — Other Ambulatory Visit: Payer: Self-pay | Admitting: Family Medicine

## 2021-03-25 ENCOUNTER — Other Ambulatory Visit: Payer: Self-pay | Admitting: Family Medicine

## 2021-03-26 NOTE — Telephone Encounter (Signed)
[facility] closed on weekend- needs to call and notify the wrong dose is being requested

## 2021-03-31 ENCOUNTER — Other Ambulatory Visit: Payer: Self-pay | Admitting: Family Medicine

## 2021-04-04 ENCOUNTER — Other Ambulatory Visit: Payer: Self-pay | Admitting: Family Medicine

## 2021-04-04 NOTE — Telephone Encounter (Signed)
Dose increased to 50 mg on 02/10/2021.   This is for 25 mg.

## 2021-04-05 ENCOUNTER — Other Ambulatory Visit: Payer: Self-pay | Admitting: Family Medicine

## 2021-04-05 DIAGNOSIS — M5416 Radiculopathy, lumbar region: Secondary | ICD-10-CM | POA: Diagnosis not present

## 2021-04-05 DIAGNOSIS — M5126 Other intervertebral disc displacement, lumbar region: Secondary | ICD-10-CM | POA: Diagnosis not present

## 2021-04-07 DIAGNOSIS — E1122 Type 2 diabetes mellitus with diabetic chronic kidney disease: Secondary | ICD-10-CM

## 2021-04-07 DIAGNOSIS — I5032 Chronic diastolic (congestive) heart failure: Secondary | ICD-10-CM | POA: Diagnosis not present

## 2021-04-07 DIAGNOSIS — I10 Essential (primary) hypertension: Secondary | ICD-10-CM

## 2021-04-26 DIAGNOSIS — H4912 Fourth [trochlear] nerve palsy, left eye: Secondary | ICD-10-CM | POA: Diagnosis not present

## 2021-04-30 ENCOUNTER — Other Ambulatory Visit: Payer: Self-pay | Admitting: Family Medicine

## 2021-05-15 DIAGNOSIS — M5030 Other cervical disc degeneration, unspecified cervical region: Secondary | ICD-10-CM | POA: Diagnosis not present

## 2021-05-15 DIAGNOSIS — M48062 Spinal stenosis, lumbar region with neurogenic claudication: Secondary | ICD-10-CM | POA: Diagnosis not present

## 2021-05-15 DIAGNOSIS — M5412 Radiculopathy, cervical region: Secondary | ICD-10-CM | POA: Diagnosis not present

## 2021-05-15 DIAGNOSIS — M5126 Other intervertebral disc displacement, lumbar region: Secondary | ICD-10-CM | POA: Diagnosis not present

## 2021-05-15 DIAGNOSIS — M5416 Radiculopathy, lumbar region: Secondary | ICD-10-CM | POA: Diagnosis not present

## 2021-05-15 DIAGNOSIS — M4802 Spinal stenosis, cervical region: Secondary | ICD-10-CM | POA: Diagnosis not present

## 2021-05-15 DIAGNOSIS — M5136 Other intervertebral disc degeneration, lumbar region: Secondary | ICD-10-CM | POA: Diagnosis not present

## 2021-05-16 DIAGNOSIS — Z85820 Personal history of malignant melanoma of skin: Secondary | ICD-10-CM | POA: Diagnosis not present

## 2021-05-16 DIAGNOSIS — Z85828 Personal history of other malignant neoplasm of skin: Secondary | ICD-10-CM | POA: Diagnosis not present

## 2021-05-16 DIAGNOSIS — L570 Actinic keratosis: Secondary | ICD-10-CM | POA: Diagnosis not present

## 2021-05-16 DIAGNOSIS — L578 Other skin changes due to chronic exposure to nonionizing radiation: Secondary | ICD-10-CM | POA: Diagnosis not present

## 2021-05-16 DIAGNOSIS — Z859 Personal history of malignant neoplasm, unspecified: Secondary | ICD-10-CM | POA: Diagnosis not present

## 2021-05-16 DIAGNOSIS — Z872 Personal history of diseases of the skin and subcutaneous tissue: Secondary | ICD-10-CM | POA: Diagnosis not present

## 2021-05-16 DIAGNOSIS — C44311 Basal cell carcinoma of skin of nose: Secondary | ICD-10-CM | POA: Diagnosis not present

## 2021-05-16 DIAGNOSIS — L821 Other seborrheic keratosis: Secondary | ICD-10-CM | POA: Diagnosis not present

## 2021-05-16 DIAGNOSIS — D485 Neoplasm of uncertain behavior of skin: Secondary | ICD-10-CM | POA: Diagnosis not present

## 2021-05-23 DIAGNOSIS — M4802 Spinal stenosis, cervical region: Secondary | ICD-10-CM | POA: Diagnosis not present

## 2021-05-23 DIAGNOSIS — M5412 Radiculopathy, cervical region: Secondary | ICD-10-CM | POA: Diagnosis not present

## 2021-05-26 ENCOUNTER — Other Ambulatory Visit: Payer: Self-pay | Admitting: Family Medicine

## 2021-05-26 NOTE — Telephone Encounter (Signed)
Requested Prescriptions  Pending Prescriptions Disp Refills  . metoprolol tartrate (LOPRESSOR) 25 MG tablet [Pharmacy Med Name: Metoprolol Tartrate 25 MG Oral Tablet] 180 tablet 0    Sig: TAKE 1 TABLET BY MOUTH  TWICE DAILY     Cardiovascular:  Beta Blockers Passed - 05/26/2021  4:49 AM      Passed - Last BP in normal range    BP Readings from Last 1 Encounters:  02/06/21 136/80         Passed - Last Heart Rate in normal range    Pulse Readings from Last 1 Encounters:  02/06/21 82         Passed - Valid encounter within last 6 months    Recent Outpatient Visits          3 months ago Type 2 diabetes mellitus with chronic kidney disease, without long-term current use of insulin, unspecified CKD stage [facility])   [facility] Birdie Sons, MD   8 months ago Diabetes mellitus with nephropathy [facility])   [facility] Birdie Sons, MD   12 months ago Diabetes mellitus with nephropathy [facility])   [facility] Birdie Sons, MD   1 year ago Infected abrasion of right lower extremity, initial encounter   [facility] Birdie Sons, MD   1 year ago Diabetes mellitus with nephropathy [facility])   [facility] Caryn Section, Kirstie Peri, MD

## 2021-06-07 DIAGNOSIS — I5032 Chronic diastolic (congestive) heart failure: Secondary | ICD-10-CM | POA: Diagnosis not present

## 2021-06-07 DIAGNOSIS — I10 Essential (primary) hypertension: Secondary | ICD-10-CM | POA: Diagnosis not present

## 2021-06-07 DIAGNOSIS — Z951 Presence of aortocoronary bypass graft: Secondary | ICD-10-CM | POA: Diagnosis not present

## 2021-06-07 DIAGNOSIS — Z9989 Dependence on other enabling machines and devices: Secondary | ICD-10-CM | POA: Diagnosis not present

## 2021-06-07 DIAGNOSIS — J42 Unspecified chronic bronchitis: Secondary | ICD-10-CM | POA: Diagnosis not present

## 2021-06-07 DIAGNOSIS — E119 Type 2 diabetes mellitus without complications: Secondary | ICD-10-CM | POA: Diagnosis not present

## 2021-06-07 DIAGNOSIS — N1832 Chronic kidney disease, stage 3b: Secondary | ICD-10-CM | POA: Diagnosis not present

## 2021-06-07 DIAGNOSIS — E7800 Pure hypercholesterolemia, unspecified: Secondary | ICD-10-CM | POA: Diagnosis not present

## 2021-06-07 DIAGNOSIS — I25118 Atherosclerotic heart disease of native coronary artery with other forms of angina pectoris: Secondary | ICD-10-CM | POA: Diagnosis not present

## 2021-06-07 DIAGNOSIS — G4733 Obstructive sleep apnea (adult) (pediatric): Secondary | ICD-10-CM | POA: Diagnosis not present

## 2021-06-07 DIAGNOSIS — R600 Localized edema: Secondary | ICD-10-CM | POA: Diagnosis not present

## 2021-06-14 ENCOUNTER — Other Ambulatory Visit: Payer: Self-pay

## 2021-06-14 ENCOUNTER — Encounter: Payer: Self-pay | Admitting: Family Medicine

## 2021-06-14 ENCOUNTER — Ambulatory Visit (INDEPENDENT_AMBULATORY_CARE_PROVIDER_SITE_OTHER): Payer: Medicare Other | Admitting: Family Medicine

## 2021-06-14 VITALS — BP 115/62 | HR 86 | Temp 98.6°F | Resp 18 | Ht 60.0 in | Wt 216.0 lb

## 2021-06-14 DIAGNOSIS — G4733 Obstructive sleep apnea (adult) (pediatric): Secondary | ICD-10-CM

## 2021-06-14 DIAGNOSIS — Z9989 Dependence on other enabling machines and devices: Secondary | ICD-10-CM | POA: Diagnosis not present

## 2021-06-14 DIAGNOSIS — I10 Essential (primary) hypertension: Secondary | ICD-10-CM

## 2021-06-14 DIAGNOSIS — E1121 Type 2 diabetes mellitus with diabetic nephropathy: Secondary | ICD-10-CM | POA: Diagnosis not present

## 2021-06-14 DIAGNOSIS — Z0000 Encounter for general adult medical examination without abnormal findings: Secondary | ICD-10-CM

## 2021-06-14 DIAGNOSIS — I5032 Chronic diastolic (congestive) heart failure: Secondary | ICD-10-CM

## 2021-06-14 DIAGNOSIS — Z23 Encounter for immunization: Secondary | ICD-10-CM

## 2021-06-14 DIAGNOSIS — D696 Thrombocytopenia, unspecified: Secondary | ICD-10-CM

## 2021-06-14 DIAGNOSIS — N1832 Chronic kidney disease, stage 3b: Secondary | ICD-10-CM

## 2021-06-14 NOTE — Progress Notes (Signed)
Annual Wellness Visit     Patient: Mark Seider., Male    DOB: 1935-06-02, 86 y.o.   MRN: [id number] Visit Date: 06/14/2021  Today's Provider: [name], MD   Chief Complaint  Patient presents with   Medicare Wellness   Diabetes   Chronic Kidney Disease   Congestive Heart Failure   Hypertension   Subjective    Mark Holcomb. is a 86 y.o. male who presents today for his Annual Wellness Visit. He reports consuming a general diet. Home exercise routine includes strength training with exercise equipment. He generally feels fairly well. He reports sleeping fairly well. He does not have additional problems to discuss today.   HPI Diabetes Mellitus Type II, Follow-up  Lab Results  Component Value Date   HGBA1C 6.9 (A) 02/06/2021   HGBA1C 7.5 (A) 09/27/2020   HGBA1C 7.4 (A) 05/30/2020   Wt Readings from Last 3 Encounters:  06/14/21 216 lb (98 kg)  02/06/21 225 lb (102.1 kg)  09/27/20 226 lb (102.5 kg)   Last seen for diabetes 4 months ago.  Management since then includes continue same medication. He reports good compliance with treatment. He is not having side effects.  Symptoms: No fatigue No foot ulcerations  No appetite changes No nausea  No paresthesia of the feet  No polydipsia  No polyuria No visual disturbances   No vomiting     Home blood sugar records: fasting range: 108-140  Episodes of hypoglycemia? No    Current insulin regiment: none Most Recent Eye Exam: 01/26/2021 Current exercise: strength exercise using exercise equipment Current diet habits: well balanced  Pertinent Labs: Lab Results  Component Value Date   CHOL 131 09/27/2020   HDL 44 09/27/2020   LDLCALC 46 09/27/2020   TRIG 268 (H) 09/27/2020   CHOLHDL 3.0 09/27/2020   Lab Results  Component Value Date   NA 140 02/08/2021   K 4.5 02/08/2021   CREATININE 1.86 (H) 02/08/2021   EGFR 35 (L) 02/08/2021   MICROALBUR 50 09/27/2020   LABMICR See below: 07/10/2018      ---------------------------------------------------------------------------------------------------   Follow up for CKD:  The patient was last seen for this 4 months ago. Changes made at last visit include none.  He reports good compliance with treatment. He feels that condition is Unchanged. He is not having side effects.   -----------------------------------------------------------------------------------------   Follow up for CHF:  The patient was last seen for this 4 months ago. Changes made at last visit include none.  He reports good compliance with treatment. He feels that condition is Unchanged. He is not having side effects.  He continues to follow up every six months with Dr. Clayborn Bigness.  -----------------------------------------------------------------------------------------   Hypertension, follow-up  BP Readings from Last 3 Encounters:  06/14/21 115/62  02/06/21 136/80  09/27/20 113/73   Wt Readings from Last 3 Encounters:  06/14/21 216 lb (98 kg)  02/06/21 225 lb (102.1 kg)  09/27/20 226 lb (102.5 kg)     He was last seen for hypertension 8 months ago.  BP at that visit was 113/73. Management since that visit includes; labs checked-started losartan 25 mg for CKD.  He reports good compliance with treatment. He is not having side effects.  He is following a Regular diet. He is exercising. He does not smoke.  Use of agents associated with hypertension: NSAIDS.   Outside blood pressures are averaging 140/ 86. Symptoms: No chest pain No chest pressure  No palpitations No syncope  No dyspnea No orthopnea  No paroxysmal nocturnal dyspnea No lower extremity edema   Pertinent labs: Lab Results  Component Value Date   NA 140 02/08/2021   K 4.5 02/08/2021   CREATININE 1.86 (H) 02/08/2021   EGFR 35 (L) 02/08/2021   GLUCOSE 116 (H) 02/08/2021   TSH 4.330 09/03/2016     The ASCVD Risk score (Arnett DK, et al., 2019) failed to calculate for the  following reasons:   The 2019 ASCVD risk score is only valid for ages 28 to 21   ---------------------------------------------------------------------------------------------------   He reports he had stopped using his CPAP, but had follow up with Dr. Raul Del recently and was encouraged to start using it consistently which he is now doing.   Medications: Outpatient Medications Prior to Visit  Medication Sig   Accu-Chek FastClix Lancets MISC USE TO CHECK BLOOD SUGAR  DAILY   ACCU-CHEK GUIDE test strip CHECK BLOOD SUGAR ONCE  DAILY FOR TYPE 2 DIABETES   aspirin 81 MG tablet Take 81 mg by mouth daily.   azelastine (ASTELIN) 0.1 % nasal spray Place 2 sprays into both nostrils 2 (two) times daily. Use in each nostril as directed   Blood Glucose Monitoring Suppl (ACCU-CHEK GUIDE) w/Device KIT USE AS DIRECTED DAILY   doxazosin (CARDURA) 2 MG tablet TAKE 1 TABLET BY MOUTH  DAILY   empagliflozin (JARDIANCE) 25 MG TABS tablet Take 1 tablet (25 mg total) by mouth daily.   fluticasone (FLONASE) 50 MCG/ACT nasal spray USE 1 TO 2 SPRAYS IN BOTH  NOSTRILS DAILY   furosemide (LASIX) 40 MG tablet TAKE 1 TABLET BY MOUTH  DAILY   gabapentin (NEURONTIN) 300 MG capsule TAKE 1 CAPSULE BY MOUTH  TWICE DAILY   glimepiride (AMARYL) 1 MG tablet TAKE 1 TABLET BY MOUTH  DAILY   HYDROcodone-acetaminophen (NORCO/VICODIN) 5-325 MG tablet TAKE 1 TABLET BY MOUTH TWICE DAILY AS NEEDED   ibuprofen (ADVIL,MOTRIN) 200 MG tablet Take 200 mg by mouth every 6 (six) hours as needed.   Lancets Misc. (ACCU-CHEK FASTCLIX LANCET) KIT Used to check glucose.  DX E11.9   loratadine (CLARITIN) 10 MG tablet Take 10 mg by mouth daily.   losartan (COZAAR) 50 MG tablet Take 1 tablet (50 mg total) by mouth daily.   meloxicam (MOBIC) 15 MG tablet TAKE 1 TABLET BY MOUTH  DAILY AS NEEDED   metFORMIN (GLUCOPHAGE-XR) 500 MG 24 hr tablet Take 1 tablet (500 mg total) by mouth 2 (two) times daily.   metoprolol tartrate (LOPRESSOR) 25 MG tablet TAKE  1 TABLET BY MOUTH  TWICE DAILY   Multiple Vitamin (MULTIVITAMIN WITH MINERALS) TABS Take 1 tablet by mouth daily.   omeprazole (PRILOSEC) 40 MG capsule TAKE 1 CAPSULE BY MOUTH  DAILY   oxybutynin (DITROPAN) 5 MG tablet TAKE 1 TABLET BY MOUTH  TWICE DAILY   potassium chloride SA (KLOR-CON) 20 MEQ tablet TAKE 1 TABLET BY MOUTH  DAILY   simvastatin (ZOCOR) 40 MG tablet TAKE 1 TABLET BY MOUTH AT  BEDTIME   traZODone (DESYREL) 100 MG tablet TAKE 1/2 TO 1 TABLET BY  MOUTH AT BEDTIME AS NEEDED  FOR SLEEP   No facility-administered medications prior to visit.    Allergies  Allergen Reactions   Propoxyphene Anaphylaxis   Codeine Sulfate     Patient denies   Darvon [Propoxyphene Hcl] Other (See Comments)    Short of breath   Demerol [Meperidine] Other (See Comments)    Short of breath   Oxycodone Nausea Only  Patient Care Team: Birdie Sons, MD as PCP - General (Family Medicine) Yolonda Kida, MD as Consulting Physician (Cardiology) Erby Pian, MD as Referring Physician (Specialist) Sharlet Salina, MD as Referring Physician (Physical Medicine and Rehabilitation) Jannet Mantis, MD as Consulting Physician (Dermatology) Rory Percy, MD as Referring Physician (Dermatology)  Review of Systems  Constitutional:  Negative for appetite change, chills, fatigue and fever.  HENT:  Negative for congestion, ear pain, hearing loss, nosebleeds and trouble swallowing.   Eyes:  Negative for pain and visual disturbance.  Respiratory:  Negative for cough, chest tightness and shortness of breath.   Cardiovascular:  Negative for chest pain, palpitations and leg swelling.  Gastrointestinal:  Negative for abdominal pain, blood in stool, constipation, diarrhea, nausea and vomiting.  Endocrine: Negative for polydipsia, polyphagia and polyuria.  Genitourinary:  Negative for dysuria and flank pain.  Musculoskeletal:  Negative for arthralgias, back pain, joint swelling, myalgias  and neck stiffness.  Skin:  Negative for color change, rash and wound.  Neurological:  Negative for dizziness, tremors, seizures, speech difficulty, weakness, light-headedness and headaches.  Psychiatric/Behavioral:  Negative for behavioral problems, confusion, decreased concentration, dysphoric mood and sleep disturbance. The patient is not nervous/anxious.   All other systems reviewed and are negative.      Objective    Vitals: BP 115/62 (BP Location: Right Arm, Patient Position: Sitting, Cuff Size: Large)   Pulse 86   Temp 98.6 F (37 C) (Oral)   Resp 18   Ht 5' (1.524 m)   Wt 216 lb (98 kg)   SpO2 100% Comment: room air  BMI 42.18 kg/m    Physical Exam  General: Appearance:    Obese male in no acute distress  Eyes:    PERRL, conjunctiva/corneas clear, EOM's intact       Lungs:     Clear to auscultation bilaterally, respirations unlabored  Heart:    Normal heart rate. Normal rhythm. No murmurs, rubs, or gallops.    MS:   All extremities are intact.    Neurologic:   Awake, alert, oriented x 3. No apparent focal neurological defect.         Most recent functional status assessment: In your present state of health, do you have any difficulty performing the following activities: 06/14/2021  Hearing? Y  Vision? Y  Difficulty concentrating or making decisions? N  Walking or climbing stairs? Y  Dressing or bathing? Y  Doing errands, shopping? Y  Some recent data might be hidden   Most recent fall risk assessment: Fall Risk  06/14/2021  Falls in the past year? 1  Number falls in past yr: 1  Comment -  Injury with Fall? 0  Comment -  Risk Factor Category  -  Risk for fall due to : History of fall(s)  Follow up Falls prevention discussed    Most recent depression screenings: PHQ 2/9 Scores 06/14/2021 10/11/2020  PHQ - 2 Score 0 0  PHQ- 9 Score 3 -   Most recent cognitive screening: 6CIT Screen 01/08/2017  What Year? 0 points  What month? 0 points  What time? 0 points   Count back from 20 0 points  Months in reverse 0 points  Repeat phrase 2 points  Total Score 2   Most recent Audit-C alcohol use screening Alcohol Use Disorder Test (AUDIT) 06/14/2021  1. How often do you have a drink containing alcohol? 0  2. How many drinks containing alcohol do you have on a typical day  when you are drinking? 0  3. How often do you have six or more drinks on one occasion? 0  AUDIT-C Score 0  Alcohol Brief Interventions/Follow-up -   A score of 3 or more in women, and 4 or more in men indicates increased risk for alcohol abuse, EXCEPT if all of the points are from question 1   No results found for any visits on 06/14/21.  Assessment & Plan     Annual wellness visit done today including the all of the following: Reviewed patient's Family Medical History Reviewed and updated list of patient's medical providers Assessment of cognitive impairment was done Assessed patient's functional ability Established a written schedule for health screening [place] Completed and Reviewed  Exercise Activities and Dietary recommendations  Goals      Prevent falls     Recommend to remove any items from the home that may cause slips or trips.        Immunization History  Administered Date(s) Administered   Fluad Quad(high Dose 65+) 03/25/2019, 05/30/2020, 06/14/2021   Influenza, High Dose Seasonal PF 04/23/2015, 03/31/2016, 05/02/2017, 04/02/2018   Influenza-Unspecified 03/09/2014   Moderna SARS-COV2 Booster Vaccination 05/10/2020   PFIZER(Purple Top)SARS-COV-2 Vaccination 08/04/2019, 08/25/2019   Pneumococcal Conjugate-13 03/16/2014   Pneumococcal Polysaccharide-23 06/12/2000, 01/27/2009   Td 02/25/2001   Tdap 05/14/2012   Zoster Recombinat (Shingrix) 03/14/2018, 05/19/2018   Zoster, Live 07/16/2006    Health Maintenance  Topic Date Due   FOOT EXAM  01/08/2018   COVID-19 Vaccine (3 - Pfizer risk series) 06/07/2020   HEMOGLOBIN A1C   08/09/2021   OPHTHALMOLOGY EXAM  01/26/2022   TETANUS/TDAP  05/14/2022   Pneumonia Vaccine 67+ Years old  Completed   INFLUENZA VACCINE  Completed   Zoster Vaccines- Shingrix  Completed   HPV VACCINES  Aged Out   COLONOSCOPY (Pts 45-62yr Insurance coverage will need to be confirmed)  Discontinued     Discussed health benefits of physical activity, and encouraged him to engage in regular exercise appropriate for his age and condition.    1. Need for influenza vaccination  - Flu Vaccine QUAD High Dose IM (Fluad)  2. Diabetes mellitus with nephropathy ([place] Doing well current medications. Encourage regular activity, especially walking.  - Lipid panel - Hemoglobin A1c  3. Essential (primary) hypertension Well controlled.  Continue current medications.    4. Chronic kidney disease, stage 3b (HCC)  - Comprehensive metabolic panel  5. Thrombocytopenia (HCC)  - CBC  6. Chronic diastolic congestive heart failure (HCC) Stable on current medications continue routine follow up Dr. CClayborn Bignessevery six months.   7. OSA on CPAP Now using CPAP consistently. Followed by Dr. FRaul Del   Future Appointments  Date Time Provider DAlta Sierra 10/25/2021  8:00 AM FCaryn Section DKirstie Peri MD [facility]       The entirety of the information documented in the History of Present Illness, Review of Systems and Physical Exam were personally obtained by me. Portions of this information were initially documented by the CMA and reviewed by me for thoroughness and accuracy.     DLelon Huh MD  [phone](phone) [phone](fax)  [place]

## 2021-06-15 ENCOUNTER — Other Ambulatory Visit: Payer: Self-pay

## 2021-06-15 LAB — LIPID PANEL
Chol/HDL Ratio: 3.1 ratio (ref 0.0–5.0)
Cholesterol, Total: 138 mg/dL (ref 100–199)
HDL: 45 mg/dL (ref >39)
LDL Chol Calc (NIH): 54 mg/dL (ref 0–99)
Triglycerides: 249 mg/dL — ABNORMAL HIGH (ref 0–149)
VLDL Cholesterol Cal: 39 mg/dL (ref 5–40)

## 2021-06-15 LAB — COMPREHENSIVE METABOLIC PANEL
ALT: 29 IU/L (ref 0–44)
AST: 29 IU/L (ref 0–40)
Albumin/Globulin Ratio: 1.8 (ref 1.2–2.2)
Albumin: 4.6 g/dL (ref 3.6–4.6)
Alkaline Phosphatase: 77 IU/L (ref 44–121)
BUN/Creatinine Ratio: 16 (ref 10–24)
BUN: 30 mg/dL — ABNORMAL HIGH (ref 8–27)
Bilirubin Total: 0.3 mg/dL (ref 0.0–1.2)
CO2: 25 mmol/L (ref 20–29)
Calcium: 9.8 mg/dL (ref 8.6–10.2)
Chloride: 100 mmol/L (ref 96–106)
Creatinine, Ser: 1.83 mg/dL — ABNORMAL HIGH (ref 0.76–1.27)
Globulin, Total: 2.6 g/dL (ref 1.5–4.5)
Glucose: 114 mg/dL — ABNORMAL HIGH (ref 70–99)
Potassium: 4.2 mmol/L (ref 3.5–5.2)
Sodium: 139 mmol/L (ref 134–144)
Total Protein: 7.2 g/dL (ref 6.0–8.5)
eGFR: 36 mL/min/{1.73_m2} — ABNORMAL LOW (ref >59)

## 2021-06-15 LAB — CBC
Hematocrit: 37.8 % (ref 37.5–51.0)
Hemoglobin: 11.9 g/dL — ABNORMAL LOW (ref 13.0–17.7)
MCH: 29.6 pg (ref 26.6–33.0)
MCHC: 31.5 g/dL (ref 31.5–35.7)
MCV: 94 fL (ref 79–97)
Platelets: 163 10*3/uL (ref 150–450)
RBC: 4.02 x10E6/uL — ABNORMAL LOW (ref 4.14–5.80)
RDW: 15.4 % (ref 11.6–15.4)
WBC: 8.5 10*3/uL (ref 3.4–10.8)

## 2021-06-15 LAB — HEMOGLOBIN A1C
Est. average glucose Bld gHb Est-mCnc: 169 mg/dL
Hgb A1c MFr Bld: 7.5 % — ABNORMAL HIGH (ref 4.8–5.6)

## 2021-06-22 DIAGNOSIS — C44311 Basal cell carcinoma of skin of nose: Secondary | ICD-10-CM | POA: Diagnosis not present

## 2021-07-28 ENCOUNTER — Other Ambulatory Visit: Payer: Self-pay | Admitting: Family Medicine

## 2021-07-28 NOTE — Telephone Encounter (Signed)
Requested Prescriptions  Pending Prescriptions Disp Refills   traZODone (DESYREL) 100 MG tablet [Pharmacy Med Name: traZODone HCl 100 MG Oral Tablet] 90 tablet 1    Sig: TAKE 1/2 TO 1 TABLET BY  MOUTH AT BEDTIME AS NEEDED  FOR SLEEP     Psychiatry: Antidepressants - Serotonin Modulator Passed - 07/28/2021  5:26 AM      Passed - Valid encounter within last 6 months    Recent Outpatient Visits          1 month ago Need for influenza vaccination   [facility] Birdie Sons, MD   5 months ago Type 2 diabetes mellitus with chronic kidney disease, without long-term current use of insulin, unspecified CKD stage [facility])   [facility] Birdie Sons, MD   10 months ago Diabetes mellitus with nephropathy [facility])   [facility] Birdie Sons, MD   1 year ago Diabetes mellitus with nephropathy [facility])   [facility] Birdie Sons, MD   1 year ago Infected abrasion of right lower extremity, initial encounter   [facility] Birdie Sons, MD      Future Appointments            In 2 months Fisher, Kirstie Peri, MD [facility], [place]

## 2021-08-05 ENCOUNTER — Other Ambulatory Visit: Payer: Self-pay | Admitting: Family Medicine

## 2021-08-05 DIAGNOSIS — I10 Essential (primary) hypertension: Secondary | ICD-10-CM

## 2021-08-06 NOTE — Telephone Encounter (Signed)
Requested medication (s) are due for refill today - expired rx  Requested medication (s) are on the active medication list -yes  Future visit scheduled -yes  Last refill: 07/01/20 #90 1RF  Notes to clinic: Request RF: expired Rx- sent for review   Requested Prescriptions  Pending Prescriptions Disp Refills   doxazosin (CARDURA) 2 MG tablet [Pharmacy Med Name: DOXAZOSIN  2MG   TAB  MES] 90 tablet 3    Sig: TAKE 1 TABLET BY MOUTH  DAILY     Cardiovascular:  Alpha Blockers Passed - 08/05/2021 10:42 PM      Passed - Last BP in normal range    BP Readings from Last 1 Encounters:  06/14/21 115/62          Passed - Valid encounter within last 6 months    Recent Outpatient Visits           1 month ago Need for influenza vaccination   [facility] Birdie Sons, MD   6 months ago Type 2 diabetes mellitus with chronic kidney disease, without long-term current use of insulin, unspecified CKD stage [facility])   [place], Donald E, MD   10 months ago Diabetes mellitus with nephropathy [facility])   [facility] Birdie Sons, MD   1 year ago Diabetes mellitus with nephropathy [facility])   [facility] Birdie Sons, MD   1 year ago Infected abrasion of right lower extremity, initial encounter   [place], Kirstie Peri, MD       Future Appointments             In 2 months Fisher, Kirstie Peri, MD [facility], [facility]               Requested Prescriptions  Pending Prescriptions Disp Refills   doxazosin (CARDURA) 2 MG tablet [Pharmacy Med Name: DOXAZOSIN  2MG   TAB  MES] 90 tablet 3    Sig: TAKE 1 TABLET BY MOUTH  DAILY     Cardiovascular:  Alpha Blockers Passed - 08/05/2021 10:42 PM      Passed - Last BP in normal range    BP Readings from Last 1 Encounters:  06/14/21 115/62          Passed - Valid encounter within last 6 months    Recent Outpatient Visits           1 month ago  Need for influenza vaccination   [facility] Birdie Sons, MD   6 months ago Type 2 diabetes mellitus with chronic kidney disease, without long-term current use of insulin, unspecified CKD stage [facility])   [facility] Birdie Sons, MD   10 months ago Diabetes mellitus with nephropathy [facility])   [facility] Birdie Sons, MD   1 year ago Diabetes mellitus with nephropathy [facility])   [facility] Birdie Sons, MD   1 year ago Infected abrasion of right lower extremity, initial encounter   [facility] Birdie Sons, MD       Future Appointments             In 2 months Fisher, Kirstie Peri, MD [facility], [place]

## 2021-08-11 ENCOUNTER — Other Ambulatory Visit: Payer: Self-pay | Admitting: Family Medicine

## 2021-08-11 NOTE — Telephone Encounter (Signed)
Requested Prescriptions  Pending Prescriptions Disp Refills   metoprolol tartrate (LOPRESSOR) 25 MG tablet [Pharmacy Med Name: Metoprolol Tartrate 25 MG Oral Tablet] 180 tablet 1    Sig: TAKE 1 TABLET BY MOUTH TWICE  DAILY     Cardiovascular:  Beta Blockers Passed - 08/11/2021  5:00 AM      Passed - Last BP in normal range    BP Readings from Last 1 Encounters:  06/14/21 115/62         Passed - Last Heart Rate in normal range    Pulse Readings from Last 1 Encounters:  06/14/21 86         Passed - Valid encounter within last 6 months    Recent Outpatient Visits          1 month ago Need for influenza vaccination   [facility] Birdie Sons, MD   6 months ago Type 2 diabetes mellitus with chronic kidney disease, without long-term current use of insulin, unspecified CKD stage [facility])   [facility] Birdie Sons, MD   10 months ago Diabetes mellitus with nephropathy [facility])   [facility] Birdie Sons, MD   1 year ago Diabetes mellitus with nephropathy [facility])   [facility] Birdie Sons, MD   1 year ago Infected abrasion of right lower extremity, initial encounter   [facility] Birdie Sons, MD      Future Appointments            In 2 months Fisher, Kirstie Peri, MD [facility] [place]

## 2021-08-13 ENCOUNTER — Other Ambulatory Visit: Payer: Self-pay | Admitting: Family Medicine

## 2021-08-21 DIAGNOSIS — M5030 Other cervical disc degeneration, unspecified cervical region: Secondary | ICD-10-CM | POA: Diagnosis not present

## 2021-08-21 DIAGNOSIS — M5416 Radiculopathy, lumbar region: Secondary | ICD-10-CM | POA: Diagnosis not present

## 2021-08-21 DIAGNOSIS — M5136 Other intervertebral disc degeneration, lumbar region: Secondary | ICD-10-CM | POA: Diagnosis not present

## 2021-08-21 DIAGNOSIS — M48062 Spinal stenosis, lumbar region with neurogenic claudication: Secondary | ICD-10-CM | POA: Diagnosis not present

## 2021-08-21 DIAGNOSIS — M5126 Other intervertebral disc displacement, lumbar region: Secondary | ICD-10-CM | POA: Diagnosis not present

## 2021-08-21 DIAGNOSIS — Z79899 Other long term (current) drug therapy: Secondary | ICD-10-CM | POA: Diagnosis not present

## 2021-08-21 DIAGNOSIS — M4802 Spinal stenosis, cervical region: Secondary | ICD-10-CM | POA: Diagnosis not present

## 2021-08-21 DIAGNOSIS — M5412 Radiculopathy, cervical region: Secondary | ICD-10-CM | POA: Diagnosis not present

## 2021-08-25 DIAGNOSIS — M5416 Radiculopathy, lumbar region: Secondary | ICD-10-CM | POA: Diagnosis not present

## 2021-08-25 DIAGNOSIS — M5126 Other intervertebral disc displacement, lumbar region: Secondary | ICD-10-CM | POA: Diagnosis not present

## 2021-08-31 ENCOUNTER — Other Ambulatory Visit: Payer: Self-pay | Admitting: Family Medicine

## 2021-09-08 ENCOUNTER — Other Ambulatory Visit: Payer: Self-pay | Admitting: Family Medicine

## 2021-09-08 DIAGNOSIS — E1122 Type 2 diabetes mellitus with diabetic chronic kidney disease: Secondary | ICD-10-CM

## 2021-10-14 ENCOUNTER — Other Ambulatory Visit: Payer: Self-pay | Admitting: Family Medicine

## 2021-10-15 ENCOUNTER — Other Ambulatory Visit: Payer: Self-pay | Admitting: Family Medicine

## 2021-10-25 ENCOUNTER — Ambulatory Visit (INDEPENDENT_AMBULATORY_CARE_PROVIDER_SITE_OTHER): Payer: Medicare Other | Admitting: Family Medicine

## 2021-10-25 ENCOUNTER — Encounter: Payer: Self-pay | Admitting: Family Medicine

## 2021-10-25 VITALS — BP 112/53 | HR 74 | Temp 97.6°F | Resp 14 | Wt 212.1 lb

## 2021-10-25 DIAGNOSIS — N1832 Chronic kidney disease, stage 3b: Secondary | ICD-10-CM

## 2021-10-25 DIAGNOSIS — M5116 Intervertebral disc disorders with radiculopathy, lumbar region: Secondary | ICD-10-CM | POA: Diagnosis not present

## 2021-10-25 DIAGNOSIS — I700 Atherosclerosis of aorta: Secondary | ICD-10-CM | POA: Diagnosis not present

## 2021-10-25 DIAGNOSIS — E1121 Type 2 diabetes mellitus with diabetic nephropathy: Secondary | ICD-10-CM

## 2021-10-25 DIAGNOSIS — E119 Type 2 diabetes mellitus without complications: Secondary | ICD-10-CM | POA: Diagnosis not present

## 2021-10-25 DIAGNOSIS — I5032 Chronic diastolic (congestive) heart failure: Secondary | ICD-10-CM | POA: Diagnosis not present

## 2021-10-25 DIAGNOSIS — Z125 Encounter for screening for malignant neoplasm of prostate: Secondary | ICD-10-CM

## 2021-10-25 MED ORDER — ACCU-CHEK FASTCLIX LANCET KIT: PACK | 4 refills | Status: DC

## 2021-10-25 MED ORDER — GABAPENTIN 300 MG PO CAPS: ORAL_CAPSULE | ORAL | Status: DC

## 2021-10-25 NOTE — Patient Instructions (Signed)
Please review the attached list of medications and notify my office if there are any errors.  ? ?Increase your night time dose of gabapentin to 2 capsules, continue taking 1 capsule every morning ?

## 2021-10-25 NOTE — Progress Notes (Signed)
?  ?I,Jana Robinson,acting as a scribe for [name], MD.,have documented all relevant documentation on the behalf of [name], MD,as directed by  [name], MD while in the presence of [name], MD. ? ? ? ?Established patient visit ? ? ?Patient: Mark Terry.   DOB: 11-Jan-1935   87 y.o. Male  MRN: [id number] ?Visit Date: 10/25/2021 ? ?Today's healthcare provider: [name], MD  ? ?Chief Complaint  ?Patient presents with  ? Hypertension  ? Diabetes  ? ?Subjective  ?  ?HPI  ?Diabetes Mellitus Type II, Follow-up ? ?Lab Results  ?Component Value Date  ? HGBA1C 7.5 (H) 06/14/2021  ? HGBA1C 6.9 (A) 02/06/2021  ? HGBA1C 7.5 (A) 09/27/2020  ? ?Wt Readings from Last 3 Encounters:  ?10/25/21 212 lb 1.6 oz (96.2 kg)  ?06/14/21 216 lb (98 kg)  ?02/06/21 225 lb (102.1 kg)  ? ?Last seen for diabetes 4 months ago.  ?Management since then includes no changes. ?He reports excellent compliance with treatment. ?He is not having side effects.  ?Symptoms: ?No fatigue No foot ulcerations  ?No appetite changes No nausea  ?No paresthesia of the feet  No polydipsia  ?No polyuria No visual disturbances   ?No vomiting   ? ? ?Home blood sugar records: 135 this a.m. reading / 118 yesterday ? ?Episodes of hypoglycemia? No  ?  ?Current insulin regiment: no  ?Most Recent Eye Exam: 01-26-22  ?Current exercise: none ?Current diet habits: low salt ? ?Pertinent Labs: ?Lab Results  ?Component Value Date  ? CHOL 138 06/14/2021  ? HDL 45 06/14/2021  ? LDLCALC 54 06/14/2021  ? TRIG 249 (H) 06/14/2021  ? CHOLHDL 3.1 06/14/2021  ? Lab Results  ?Component Value Date  ? NA 139 06/14/2021  ? K 4.2 06/14/2021  ? CREATININE 1.83 (H) 06/14/2021  ? EGFR 36 (L) 06/14/2021  ? MICROALBUR 50 09/27/2020  ? LABMICR See below: 07/10/2018  ?  ? ?---------------------------------------------------------------------------------------------------  ?Hypertension, follow-up ? ?BP Readings from Last 3 Encounters:  ?10/25/21 (!) 112/53  ?06/14/21  115/62  ?02/06/21 136/80  ? Wt Readings from Last 3 Encounters:  ?10/25/21 212 lb 1.6 oz (96.2 kg)  ?06/14/21 216 lb (98 kg)  ?02/06/21 225 lb (102.1 kg)  ?  ? ?He was last seen for hypertension 4 months ago.  ?BP at that visit was 115 52. Management since that visit includes no changes. ? ?He reports excellent compliance with treatment. ?He is not having side effects.  ?He is following a Low Sodium diet. ?He is not exercising. ?He does not smoke. ? ?Use of agents associated with hypertension: none.  ? ?Outside blood pressures are: takes daily: average 136/86 ?Symptoms: ?No chest pain No chest pressure  ?No palpitations No syncope  ?No dyspnea No orthopnea  ?No paroxysmal nocturnal dyspnea No lower extremity edema  ? ? ? ?---------------------------------------------------------------------------------------------------  ? ?His main complaint today is back pain continues to worsen, is followed by Dr. Sharlet Salina and has several epidural injections, but the last one only provided relief for a couple of days.  ? ?Medications: ?Outpatient Medications Prior to Visit  ?Medication Sig  ? Accu-Chek FastClix Lancets MISC USE TO CHECK BLOOD SUGAR  DAILY  ? ACCU-CHEK GUIDE test strip CHECK BLOOD SUGAR ONCE  DAILY FOR TYPE 2 DIABETES  ? aspirin 81 MG tablet Take 81 mg by mouth daily.  ? azelastine (ASTELIN) 0.1 % nasal spray Place 2 sprays into both nostrils 2 (two) times daily. Use in each nostril as directed  ?  Blood Glucose Monitoring Suppl (ACCU-CHEK GUIDE) w/Device KIT USE AS DIRECTED DAILY  ? doxazosin (CARDURA) 2 MG tablet TAKE 1 TABLET BY MOUTH  DAILY  ? empagliflozin (JARDIANCE) 25 MG TABS tablet Take 1 tablet (25 mg total) by mouth daily.  ? fluticasone (FLONASE) 50 MCG/ACT nasal spray USE 1 TO 2 SPRAYS IN BOTH  NOSTRILS DAILY  ? furosemide (LASIX) 40 MG tablet TAKE 1 TABLET BY MOUTH  DAILY  ? gabapentin (NEURONTIN) 300 MG capsule TAKE 1 CAPSULE BY MOUTH  TWICE DAILY  ? glimepiride (AMARYL) 1 MG tablet TAKE 1 TABLET BY  MOUTH  DAILY  ? HYDROcodone-acetaminophen (NORCO/VICODIN) 5-325 MG tablet TAKE 1 TABLET BY MOUTH TWICE DAILY AS NEEDED  ? ibuprofen (ADVIL,MOTRIN) 200 MG tablet Take 200 mg by mouth every 6 (six) hours as needed.  ? Lancets Misc. (ACCU-CHEK FASTCLIX LANCET) KIT Used to check glucose.  DX E11.9  ? loratadine (CLARITIN) 10 MG tablet Take 10 mg by mouth daily.  ? losartan (COZAAR) 50 MG tablet TAKE 1 TABLET BY MOUTH  DAILY  ? metFORMIN (GLUCOPHAGE-XR) 500 MG 24 hr tablet TAKE 1 TABLET BY MOUTH  TWICE DAILY  ? metoprolol tartrate (LOPRESSOR) 25 MG tablet TAKE 1 TABLET BY MOUTH TWICE  DAILY  ? Multiple Vitamin (MULTIVITAMIN WITH MINERALS) TABS Take 1 tablet by mouth daily.  ? omeprazole (PRILOSEC) 40 MG capsule TAKE 1 CAPSULE BY MOUTH  DAILY  ? oxybutynin (DITROPAN) 5 MG tablet TAKE 1 TABLET BY MOUTH  TWICE DAILY  ? potassium chloride SA (KLOR-CON) 20 MEQ tablet TAKE 1 TABLET BY MOUTH  DAILY  ? simvastatin (ZOCOR) 40 MG tablet TAKE 1 TABLET BY MOUTH AT  BEDTIME  ? traZODone (DESYREL) 100 MG tablet TAKE 1/2 TO 1 TABLET BY  MOUTH AT BEDTIME AS NEEDED  FOR SLEEP  ? ?No facility-administered medications prior to visit.  ? ? ? ? ? ?  Objective  ?  ?BP (!) 112/53 (BP Location: Right Arm, Patient Position: Sitting, Cuff Size: Normal)   Pulse 74   Temp 97.6 ?F (36.4 ?C) (Oral)   Resp 14   Wt 212 lb 1.6 oz (96.2 kg)   SpO2 97%   BMI 41.42 kg/m?  ? ? ?Physical Exam  ?General appearance: Obese male, cooperative and in no acute distress ?Head: Normocephalic, without obvious abnormality, atraumatic ?Respiratory: Respirations even and unlabored, normal respiratory rate ?Extremities: All extremities are intact.  1+ bipedal edema.  ?Skin: Skin color, texture, turgor normal. No rashes seen  ?Psych: Appropriate mood and affect. ?Neurologic: Mental status: Alert, oriented to person, place, and time, thought content appropriate.  ? ? ? Assessment & Plan  ?  ? ?1. Diabetes mellitus with nephropathy ([place]) ?Doing well on current  medication regiment.  ?- Urine Albumin-Creatinine with uACR ?- Comprehensive metabolic panel ?- Hemoglobin A1c ? ?2. Chronic diastolic congestive heart failure ([place]) ?Stable on current medications. Continue regular follow up Dr. Clayborn Bigness ? ?3. Aortic atherosclerosis ([place]) ?Asymptomatic. Compliant with medication.  Continue aggressive risk factor modification.   ? ?4. Morbid obesity ([place]) ?Weight loss efforts impeded by physical limitations. Continue to work on diet. Consider GLP1 agonist.  ? ?5. Prostate cancer screening ? ?- PSA Total (Reflex To Free) ([facility] only) ? ?6. Chronic kidney disease, stage 3b ([place]) ? ?- VITAMIN D 25 Hydroxy (Vit-D Deficiency, Fractures) ? ?7. Neuritis or radiculitis due to rupture of lumbar intervertebral disc ?Add additional $RemoveBeforeD'300mg'[id number]$  gabapentin at night. Consider further increase if tolerated. Continue follow up Dr. Sharlet Salina.  ? ? ?   ? ?  The entirety of the information documented in the History of Present Illness, Review of Systems and Physical Exam were personally obtained by me. Portions of this information were initially documented by the CMA and reviewed by me for thoroughness and accuracy.   ? ? ?[name], MD  ?[facility] ?[phone] (phone) ?[phone] (fax) ? ?[facility] ?

## 2021-10-26 ENCOUNTER — Telehealth: Payer: Self-pay

## 2021-10-26 LAB — COMPREHENSIVE METABOLIC PANEL
ALT: 26 IU/L (ref 0–44)
AST: 31 IU/L (ref 0–40)
Albumin/Globulin Ratio: 1.8 (ref 1.2–2.2)
Albumin: 4.4 g/dL (ref 3.6–4.6)
Alkaline Phosphatase: 72 IU/L (ref 44–121)
BUN/Creatinine Ratio: 15 (ref 10–24)
BUN: 26 mg/dL (ref 8–27)
Bilirubin Total: 0.4 mg/dL (ref 0.0–1.2)
CO2: 23 mmol/L (ref 20–29)
Calcium: 9.7 mg/dL (ref 8.6–10.2)
Chloride: 102 mmol/L (ref 96–106)
Creatinine, Ser: 1.75 mg/dL — ABNORMAL HIGH (ref 0.76–1.27)
Globulin, Total: 2.4 g/dL (ref 1.5–4.5)
Glucose: 134 mg/dL — ABNORMAL HIGH (ref 70–99)
Potassium: 5.2 mmol/L (ref 3.5–5.2)
Sodium: 141 mmol/L (ref 134–144)
Total Protein: 6.8 g/dL (ref 6.0–8.5)
eGFR: 38 mL/min/{1.73_m2} — ABNORMAL LOW (ref >59)

## 2021-10-26 LAB — MICROALBUMIN / CREATININE URINE RATIO
Creatinine, Urine: 66.8 mg/dL
Microalb/Creat Ratio: 33 mg/g creat — ABNORMAL HIGH (ref 0–29)
Microalbumin, Urine: 21.8 ug/mL

## 2021-10-26 LAB — HEMOGLOBIN A1C
Est. average glucose Bld gHb Est-mCnc: 157 mg/dL
Hgb A1c MFr Bld: 7.1 % — ABNORMAL HIGH (ref 4.8–5.6)

## 2021-10-26 LAB — PSA TOTAL (REFLEX TO FREE): Prostate Specific Ag, Serum: 1.6 ng/mL (ref 0.0–4.0)

## 2021-10-26 LAB — VITAMIN D 25 HYDROXY (VIT D DEFICIENCY, FRACTURES): Vit D, 25-Hydroxy: 54.4 ng/mL (ref 30.0–100.0)

## 2021-10-26 LAB — SPECIMEN STATUS REPORT

## 2021-10-26 NOTE — Telephone Encounter (Signed)
Copied from [place] [id number]. Topic: General - Other ?>> Oct 26, 2021  2:54 PM Valere Dross wrote: ?Reason for CRM: Pt called in stating he is needing a life alert button, just in case he ever falls, pt stated he has already spoke with insurance and they will need PA from PCP to get that paid for, please advise. ?

## 2021-11-02 DIAGNOSIS — C44311 Basal cell carcinoma of skin of nose: Secondary | ICD-10-CM | POA: Diagnosis not present

## 2021-11-02 NOTE — Telephone Encounter (Signed)
Pt called in wanting an update on the life alert, and requesting a call back. Please advise.  ?

## 2021-11-02 NOTE — Telephone Encounter (Signed)
Hey Chrystal. Are there any resources available that you can assist patient with to get a life alert button? I called his insurance, but they informed me that this is not a benefit that is covered under his insurance plan.  ?

## 2021-11-02 NOTE — Telephone Encounter (Signed)
I called patient and gave information below. I also advised patient and his daughter that his insurance informed me that although a medical alert button is not a covered benefit, they may cover if its authorized by a provider. This would require patient to find a company that they want to use, then have Dr. Caryn Section send an order to the company. The company would need to contact insurance for coverage. Additional information from PCP may be requested to provide proof of medical necessity. Patients daughter verbalized understanding. ?

## 2021-11-14 DIAGNOSIS — L821 Other seborrheic keratosis: Secondary | ICD-10-CM | POA: Diagnosis not present

## 2021-11-14 DIAGNOSIS — L570 Actinic keratosis: Secondary | ICD-10-CM | POA: Diagnosis not present

## 2021-11-14 DIAGNOSIS — Z85828 Personal history of other malignant neoplasm of skin: Secondary | ICD-10-CM | POA: Diagnosis not present

## 2021-11-14 DIAGNOSIS — Z859 Personal history of malignant neoplasm, unspecified: Secondary | ICD-10-CM | POA: Diagnosis not present

## 2021-11-14 DIAGNOSIS — L578 Other skin changes due to chronic exposure to nonionizing radiation: Secondary | ICD-10-CM | POA: Diagnosis not present

## 2021-11-20 DIAGNOSIS — M5416 Radiculopathy, lumbar region: Secondary | ICD-10-CM | POA: Diagnosis not present

## 2021-11-20 DIAGNOSIS — M48062 Spinal stenosis, lumbar region with neurogenic claudication: Secondary | ICD-10-CM | POA: Diagnosis not present

## 2021-11-20 DIAGNOSIS — M5126 Other intervertebral disc displacement, lumbar region: Secondary | ICD-10-CM | POA: Diagnosis not present

## 2021-11-20 DIAGNOSIS — M5136 Other intervertebral disc degeneration, lumbar region: Secondary | ICD-10-CM | POA: Diagnosis not present

## 2021-11-20 DIAGNOSIS — M5030 Other cervical disc degeneration, unspecified cervical region: Secondary | ICD-10-CM | POA: Diagnosis not present

## 2021-11-20 DIAGNOSIS — M5412 Radiculopathy, cervical region: Secondary | ICD-10-CM | POA: Diagnosis not present

## 2021-11-21 ENCOUNTER — Ambulatory Visit: Payer: Medicare Other | Admitting: Surgery

## 2021-11-21 ENCOUNTER — Other Ambulatory Visit: Payer: Self-pay

## 2021-11-21 ENCOUNTER — Telehealth: Payer: Self-pay | Admitting: *Deleted

## 2021-11-21 ENCOUNTER — Encounter: Payer: Self-pay | Admitting: Surgery

## 2021-11-21 VITALS — BP 128/83 | HR 79 | Temp 97.9°F | Ht 60.0 in | Wt 209.0 lb

## 2021-11-21 DIAGNOSIS — Z4800 Encounter for change or removal of nonsurgical wound dressing: Secondary | ICD-10-CM | POA: Insufficient documentation

## 2021-11-21 DIAGNOSIS — L0591 Pilonidal cyst without abscess: Secondary | ICD-10-CM

## 2021-11-21 MED ORDER — "AQUACEL-AG HYDROFIBER 3/4""X18"" EX MISC": 1.0000 | Freq: Two times a day (BID) | CUTANEOUS | 2 refills | Status: DC

## 2021-11-21 NOTE — Patient Instructions (Addendum)
Please pick up prescription at your pharmacy.  ? ?A referral has been placed with Home Health. They will call you with an appointment.  ? ?If you have any concerns or questions, please feel free to call our office. See follow up appointment below.  ? ?Pilonidal Cyst ? ?A pilonidal cyst is a fluid-filled sac that forms beneath the skin near the tailbone, at the top of the crease of the buttocks (pilonidal area). If the cyst is not large and not infected, it may not cause any problems. ?If the cyst becomes irritated or infected, it may get larger and fill with pus. An infected cyst is called an abscess. A pilonidal abscess may cause pain and swelling, and it may need to be drained or removed. ?What are the causes? ?The cause of this condition is not always known. In some cases, a hair that grows into your skin (ingrown hair) may be the cause. ?What increases the risk? ?You are more likely to get a pilonidal cyst if you: ?Are male. ?Have lots of hair near the crease of the buttocks. ?Are overweight. ?Have a dimple near the crease of the buttocks. ?Wear tight clothing. ?Do not bathe or shower often. ?Sit for long periods of time. ?What are the signs or symptoms? ?Signs and symptoms of a pilonidal cyst may include pain, swelling, redness, and warmth in the pilonidal area. Depending on how big the cyst is, you may be able to feel a lump near your tailbone. If your cyst becomes infected, symptoms may include: ?Pus or fluid drainage. ?Fever. ?Pain, swelling, and redness getting worse. ?The lump getting bigger. ?How is this diagnosed? ?This condition may be diagnosed based on: ?Your symptoms and medical history. ?A physical exam. ?A blood test to check for infection. ?Testing a pus sample, if applicable. ?How is this treated? ?If your cyst does not cause symptoms, you may not need any treatment. If your cyst bothers you or is infected, you may need a procedure to drain or remove the cyst. Depending on the size, location, and  severity of your cyst, your health care provider may: ?Make an incision in the cyst and drain it (incision and drainage). ?Open and drain the cyst, and then stitch the wound so that it stays open while it heals (marsupialization). You will be given instructions about how to care for your open wound while it heals. ?Remove all or part of the cyst, and then close the wound (cyst removal). ?You may need to take antibiotic medicines before your procedure. ?Follow these instructions at home: ?Medicines ?Take over-the-counter and prescription medicines only as told by your health care provider. ?If you were prescribed an antibiotic medicine, take it as told by your health care provider. Do not stop taking the antibiotic even if you start to feel better. ?General instructions ?Keep the area around your pilonidal cyst clean and dry. ?If there is fluid or pus draining from your cyst: ?Cover the area with a clean bandage (dressing) as needed. ?Wash the area gently with soap and water. Pat the area dry with a clean towel. Do not rub the area because that may cause bleeding. ?Remove hair from the area around the cyst only if your health care provider tells you to do this. ?Do not wear tight pants or sit in one position for long periods at a time. ?Keep all follow-up visits as told by your health care provider. This is important. ?Contact a health care provider if you have: ?New redness, swelling, or pain. ?  A fever. ?Severe pain. ?Summary ?A pilonidal cyst is a fluid-filled sac that forms beneath the skin near the tailbone, at the top of the crease of the buttocks (pilonidal area). ?If the cyst becomes irritated or infected, it may get larger and fill with pus. An infected cyst is called an abscess. ?The cause of this condition is not always known. In some cases, a hair that grows into your skin (ingrown hair) may be the cause. ?If your cyst does not cause symptoms, you may not need any treatment. If your cyst bothers you or is  infected, you may need a procedure to drain or remove the cyst. ?This information is not intended to replace advice given to you by your health care provider. Make sure you discuss any questions you have with your health care provider. ?Document Revised: 05/05/2020 Document Reviewed: 05/05/2020 ?Elsevier Patient Education ? [place]. ? ?

## 2021-11-21 NOTE — Telephone Encounter (Signed)
Patient called and stated that he was seen today by Dr.Rodenberg and we sent in a prescription for Silver-Carboxymethylcellulose (AQUACEL-AG HYDROFIBER 3/4"X18") 3/4"X18" and she stated that they do not have it and no one around has any in stock.  ?

## 2021-11-21 NOTE — Progress Notes (Signed)
Patient ID: Mark W Donn Jr., male   DOB: 11/10/1934, 87 y.o.   MRN: [id number] ? ?Chief Complaint: Pilonidal cyst ? ?History of Present Illness ?Mark W Mullarkey Jr. is a 87 y.o. male with a 60-year history of pilonidal cyst, multiple procedures with excision.  The last was a very wide excision with a flap creation closure years ago.  About a week and a half ago he began having some significant discomfort again in this area, and subsequent large volume of drainage and persisting slower drainage since.  He denies fever.  He currently lives alone but is unable to address the area with any type of packing or dressing changes.  He does have a hand-held shower head that enables him to cleanse the area. ? ?Past Medical History ?Past Medical History:  ?Diagnosis Date  ? Anemia   ? Bruises easily   ? Diabetes mellitus without complication (HCC)   ? H/O esophagogastroduodenoscopy   ? H/O hiatal hernia   ? History of blood transfusion   ? no reaction noted  ? History of bronchitis   ? last time a couple of yrs ago  ? History of gastric ulcer   ? History of MRSA infection   ? Hyperlipidemia   ? Hypertension   ? Leg cramps   ? takes quinine prn  ? Melanoma (HCC)   ? Panic attacks   ? Pneumonia   ? hx of last time a couple of yrs ago  ? PONV (postoperative nausea and vomiting)   ? 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 Diagnosis: Squamous cell Carcinoma Location: Mid superior forehead Pre- operative size: 1cm x 1cm Total stages: 1 Post-Operative Size: 2.3cm x 2.1cm Date of Surgery: 05/01/2019  ?  ? ? ?Past Surgical History:  ?Procedure Laterality Date  ? ANKLE FUSION Right 11/04/2015  ? Procedure: ARTHRODESIS ANKLE;  Surgeon: Justin Fowler, DPM;  Location: [facility] ORS;  Service: Podiatry;  Laterality: Right;  ? BACK SURGERY    ? x 4, last lumb. laminectomy Dr. Pool 2008 cervical fusion x 2  ? bilateral cataract surgery    ? CHOLECYSTECTOMY  1970  ? COLONOSCOPY    ? CORONARY ANGIOPLASTY    ? CORONARY ARTERY BYPASS GRAFT   2000  ?  3 vessels  ? ESOPHAGOGASTRODUODENOSCOPY (EGD) WITH PROPOFOL N/A 11/18/2017  ? Procedure: ESOPHAGOGASTRODUODENOSCOPY (EGD) WITH PROPOFOL;  Surgeon: Anna, Kiran, MD;  Location: [facility] ENDOSCOPY;  Service: Gastroenterology;  Laterality: N/A;  ? JOINT REPLACEMENT    ? MOHS SURGERY Left 12/30/2018  ? [facility], Jonathan Cook, MD  ? NECK SURGERY    ? x 2  ? prostate laser surgery    ? x 2  ? REPLACEMENT TOTAL KNEE BILATERAL  '86 and 2000  ? REVERSE SHOULDER ARTHROPLASTY  05/30/2012  ? Procedure: REVERSE SHOULDER ARTHROPLASTY;  Surgeon: Steven R Norris, MD;  Location: [facility] OR;  Service: Orthopedics;  Laterality: Right;  right reverse shoulder arthroplasty  ? SQUAMOUS CELL CARCINOMA EXCISION  05/01/2019  ? mid superior forehead/ [facility]/ Dr. Jonathan L. Cook  ? TOTAL HIP ARTHROPLASTY Right 2012  ? [place]  ? ? ?Allergies  ?Allergen Reactions  ? Propoxyphene Anaphylaxis  ? Codeine Sulfate   ?  Patient denies  ? Darvon [Propoxyphene Hcl] Other (See Comments)  ?  Short of breath  ? Demerol [Meperidine] Other (See Comments)  ?  Short of breath  ? Oxycodone Nausea Only  ? ? ?Current Outpatient Medications  ?Medication Sig Dispense Refill  ? Accu-Chek   FastClix Lancets MISC USE TO CHECK BLOOD SUGAR  DAILY 102 each 3  ? ACCU-CHEK GUIDE test strip CHECK BLOOD SUGAR ONCE  DAILY FOR TYPE 2 DIABETES 100 strip 3  ? aspirin 81 MG tablet Take 81 mg by mouth daily.    ? azelastine (ASTELIN) 0.1 % nasal spray Place 2 sprays into both nostrils 2 (two) times daily. Use in each nostril as directed 30 mL 1  ? Blood Glucose Monitoring Suppl (ACCU-CHEK GUIDE) w/Device KIT USE AS DIRECTED DAILY 1 kit 0  ? doxazosin (CARDURA) 2 MG tablet TAKE 1 TABLET BY MOUTH  DAILY 90 tablet 4  ? empagliflozin (JARDIANCE) 25 MG TABS tablet Take 1 tablet (25 mg total) by mouth daily. 90 tablet 3  ? fluticasone (FLONASE) 50 MCG/ACT nasal spray USE 1 TO 2 SPRAYS IN BOTH  NOSTRILS DAILY 48 g 5  ? furosemide (LASIX) 40 MG tablet TAKE 1 TABLET BY MOUTH  DAILY 90  tablet 3  ? gabapentin (NEURONTIN) 300 MG capsule Take one every morning and two every night    ? glimepiride (AMARYL) 1 MG tablet TAKE 1 TABLET BY MOUTH  DAILY 90 tablet 3  ? HYDROcodone-acetaminophen (NORCO/VICODIN) 5-325 MG tablet TAKE 1 TABLET BY MOUTH TWICE DAILY AS NEEDED    ? ibuprofen (ADVIL,MOTRIN) 200 MG tablet Take 200 mg by mouth every 6 (six) hours as needed.    ? Lancets Misc. (ACCU-CHEK FASTCLIX LANCET) KIT Used to check glucose.  DX E11.9 1 kit 4  ? loratadine (CLARITIN) 10 MG tablet Take 10 mg by mouth daily.    ? losartan (COZAAR) 50 MG tablet TAKE 1 TABLET BY MOUTH  DAILY 90 tablet 4  ? metFORMIN (GLUCOPHAGE-XR) 500 MG 24 hr tablet TAKE 1 TABLET BY MOUTH  TWICE DAILY 180 tablet 3  ? metoprolol tartrate (LOPRESSOR) 25 MG tablet TAKE 1 TABLET BY MOUTH TWICE  DAILY 180 tablet 1  ? Multiple Vitamin (MULTIVITAMIN WITH MINERALS) TABS Take 1 tablet by mouth daily.    ? omeprazole (PRILOSEC) 40 MG capsule TAKE 1 CAPSULE BY MOUTH  DAILY 90 capsule 4  ? oxybutynin (DITROPAN) 5 MG tablet TAKE 1 TABLET BY MOUTH  TWICE DAILY 180 tablet 3  ? potassium chloride SA (KLOR-CON) 20 MEQ tablet TAKE 1 TABLET BY MOUTH  DAILY 90 tablet 3  ? Silver-Carboxymethylcellulose (AQUACEL-AG HYDROFIBER 3/4"X18") 3/4"X18" MISC Apply 1 Dose topically in the morning and at bedtime. 5 each 2  ? simvastatin (ZOCOR) 40 MG tablet TAKE 1 TABLET BY MOUTH AT  BEDTIME 90 tablet 3  ? traZODone (DESYREL) 100 MG tablet TAKE 1/2 TO 1 TABLET BY  MOUTH AT BEDTIME AS NEEDED  FOR SLEEP 90 tablet 1  ? ?No current facility-administered medications for this visit.  ? ? ?Family History ?Family History  ?Problem Relation Age of Onset  ? Alzheimer's disease Mother   ? Heart attack Father   ? Bipolar disorder Brother   ? Kidney disease Neg Hx   ? Prostate cancer Neg Hx   ?  ? ? ?Social History ?Social History  ? ?Tobacco Use  ? Smoking status: Former  ?  Packs/day: 1.00  ?  Years: 25.00  ?  Pack years: 25.00  ?  Types: Cigarettes  ?  Quit date: 07/09/1968   ?  Years since quitting: 53.4  ? Smokeless tobacco: Never  ? Tobacco comments:  ?  quit at age 41  ?Vaping Use  ? Vaping Use: Never used  ?Substance Use Topics  ? Alcohol   use: No  ?  Alcohol/week: 0.0 standard drinks  ? Drug use: No  ?  ?  ? ? ?Review of Systems  ?Constitutional: Negative.   ?HENT:  Positive for hearing loss and nosebleeds.   ?Eyes:  Positive for blurred vision.  ?Respiratory: Negative.    ?Cardiovascular:  Positive for leg swelling.  ?Gastrointestinal:  Positive for diarrhea and heartburn.  ?Genitourinary:  Positive for urgency.  ?Skin: Negative.   ?Neurological: Negative.   ?Psychiatric/Behavioral: Negative.    ?  ? ?Physical Exam ?Blood pressure 128/83, pulse 79, temperature 97.9 ?F (36.6 ?C), temperature source Oral, height 5' (1.524 m), weight 209 lb (94.8 kg), SpO2 98 %. ?Last Weight  Most recent update: 11/21/2021 10:50 AM  ? ? Weight  ?94.8 kg (209 lb)  ?      ? ?  ? ? ?CONSTITUTIONAL: Well developed, and nourished, appropriately responsive and aware without distress.  Morbidly obese. ?EYES: Sclera non-icteric.   ?EARS, NOSE, MOUTH AND THROAT:  The oropharynx is clear. Oral mucosa is pink and moist.   Hearing is intact to voice.  ?NECK: Trachea is midline, and there is no jugular venous distension.  ?LYMPH NODES:  Lymph nodes in the neck are not enlarged. ?RESPIRATORY:  Lungs are clear, and breath sounds are equal bilaterally. Normal respiratory effort without pathologic use of accessory muscles. ?CARDIOVASCULAR: Heart is regular in rate and rhythm. ?GI: The abdomen is soft, nontender, and nondistended. There were no palpable masses. I did not appreciate hepatosplenomegaly. There were normal bowel sounds. ?GU: Natal cleft/pilonidal area notable for some maceration of the region, a chronic scar cleft, reaching a granulated deeper opening.  There is no remarkable malodor.  There is no evidence of erythema, induration or necrosis.  There appears to be a persisting moisture to this cleft, which  appears to be more than can be managed by himself at this time.  He has no desire to return to a skilled nursing facility for assistance.  He has no one to assist him with his personal care and family lives

## 2021-11-23 NOTE — Telephone Encounter (Signed)
Left a message for patient to call the office back.

## 2021-11-29 DIAGNOSIS — M5416 Radiculopathy, lumbar region: Secondary | ICD-10-CM | POA: Diagnosis not present

## 2021-11-29 DIAGNOSIS — M48062 Spinal stenosis, lumbar region with neurogenic claudication: Secondary | ICD-10-CM | POA: Diagnosis not present

## 2021-11-30 ENCOUNTER — Telehealth: Payer: Self-pay | Admitting: Surgery

## 2021-11-30 NOTE — Telephone Encounter (Signed)
Daughter, Rodney Langton called yesterday, there is no note, she is not sure who she talked to, but she is waiting for someone to call her back and she feels her dad needs home health to come in and help him.  He is 87 yrs old and can't do this on his own with dressing changes.  She lives out of the area and is not able to be there for him.  She is wanting to arrange for home health to come in. Please give her a call at [phone]. Thank you.

## 2021-11-30 NOTE — Telephone Encounter (Signed)
A referral for home health services had been faxed to [facility] by [place]. I called and spoke with them and they have the referral and should be contacting the patient in the next 24 hours. I gave there number to Lattie Haw so incase they had difficulty contacting Mr Deshotel.

## 2021-12-05 ENCOUNTER — Encounter: Payer: Self-pay | Admitting: Surgery

## 2021-12-05 ENCOUNTER — Other Ambulatory Visit: Payer: Self-pay

## 2021-12-05 ENCOUNTER — Ambulatory Visit: Payer: Medicare Other | Admitting: Surgery

## 2021-12-05 VITALS — BP 122/81 | HR 96 | Temp 98.2°F | Ht 60.0 in | Wt 207.0 lb

## 2021-12-05 DIAGNOSIS — L0591 Pilonidal cyst without abscess: Secondary | ICD-10-CM | POA: Diagnosis not present

## 2021-12-05 NOTE — Progress Notes (Signed)
Mr. Paulding returns today, unable to find any assistance with his wound care.  He reports he had an injection of his back for pain control which he says should last him 6 months, but it only lasted in 5 days. He insists on not being institutionalized to assist him with his activities of daily living and his wound care.  He has a good friend who does a lot for him but when it comes to personal wound care, the boundary goes up. He presents today still having some Lumbo sacral pain which, other physicians have told him may be more related to his coccygeal/pilonidal issues. Today his wound appears to be quite stable.  He does have a raw area that is deep in the cleft, a lot of this is exacerbated by his body habitus.  There is no evidence of malodor, necrosis, inflammatory induration, pussy discharge or evidence of active infection. I was able to probe the depth and found no additional tunneling or undermining present.  I then replaced Aquacel Ag rope to the depth of the wound and placed a new cover dressing over it.  We will have him follow-up in 2 days to evaluate how well the Aquacel Ag looks.  It may be he can have this done once weekly.   I have indicated that his wound is stable and its not a tremendous threat to him.  Obviously we need to maintain things and prevent them from getting worse which I think we are doing even at a suboptimal place right now. We will see him back on Thursday and see how it looks and how we can continue to be of some help to him.

## 2021-12-07 ENCOUNTER — Encounter: Payer: Self-pay | Admitting: Surgery

## 2021-12-07 ENCOUNTER — Ambulatory Visit (INDEPENDENT_AMBULATORY_CARE_PROVIDER_SITE_OTHER): Payer: Medicare Other | Admitting: Surgery

## 2021-12-07 VITALS — BP 127/85 | HR 96 | Temp 98.1°F | Ht 60.0 in | Wt 207.0 lb

## 2021-12-07 DIAGNOSIS — Z4800 Encounter for change or removal of nonsurgical wound dressing: Secondary | ICD-10-CM | POA: Diagnosis not present

## 2021-12-07 DIAGNOSIS — L0591 Pilonidal cyst without abscess: Secondary | ICD-10-CM | POA: Diagnosis not present

## 2021-12-07 NOTE — Progress Notes (Signed)
Mark Terry returns today 48 hours after our last visit. I have had him present himself for his dressing change.  The previous Aquacel Ag rope was out of the wound but within the dressing, it had turned to 75% gel.  There is the expected discharge from a deep seated superficial wound without epidermis.  I replaced a new piece of Aquacel Ag rope into the depth of the wound. 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 We remain readily available to help this gentleman with wound care.  It is a shame he has no one else to help him.  And unfortunately he is sufficiently debilitated so as to be unable to care for this point of hygiene himself. Hopefully, we will find some way of maintaining him.  I do not anticipate his wound to heal, nor do I anticipate any surgical intervention to remove his wound.  I spoke with him that this is essentially a maintenance issue.  I believe he understands.

## 2021-12-07 NOTE — Patient Instructions (Signed)
We will have you return Tuesday.  Remove your packing Tuesday and shower well and rinse the area.

## 2021-12-12 ENCOUNTER — Ambulatory Visit (INDEPENDENT_AMBULATORY_CARE_PROVIDER_SITE_OTHER): Payer: Medicare Other | Admitting: Surgery

## 2021-12-12 ENCOUNTER — Encounter: Payer: Self-pay | Admitting: Surgery

## 2021-12-12 VITALS — BP 124/78 | HR 87 | Temp 97.9°F | Wt 211.0 lb

## 2021-12-12 DIAGNOSIS — L0591 Pilonidal cyst without abscess: Secondary | ICD-10-CM | POA: Diagnosis not present

## 2021-12-12 NOTE — Patient Instructions (Signed)
If you have any concerns or questions, please feel free to call our office. Follow up as needed.   Pilonidal Cyst Drainage, Care After This sheet gives you information about how to care for yourself after your procedure. Your health care provider may also give you more specific instructions. If you have problems or questions, contact your health care provider. What can I expect after the procedure? After the procedure, it is common to have: Pain that gets better when you take medicine. Some fluid or blood coming from your wound. Follow these instructions at home: Medicines Take over-the-counter and prescription medicines only as told by your health care provider. If you were prescribed an antibiotic medicine, take it as told by your health care provider. Do not stop taking the antibiotic even if you start to feel better. Lifestyle Do not do activities that irritate or put pressure on your buttocks for about 2 weeks, or as long as told by your health care provider. These activities include bike riding, running, and anything that involves a twisting motion. Do not sit for long periods at a time without getting up to move around. Sleep on your side instead of your back. Avoid wearing tight underwear and tight pants. Bathing Do not take baths or showers, swim, or use a hot tub until your health care provider approves. This depends on the type of wound you have from surgery. While bathing, clean your buttocks area gently with soap and water. After bathing: Pat the area dry with a soft, clean towel. Cover the area with a clean bandage (dressing), if told to by your health care provider. General instructions  If you are taking prescription pain medicine, take actions to prevent or treat constipation. Your health care provider may recommend that you: Drink enough fluid to keep your urine pale yellow. Eat foods that are high in fiber, such as fresh fruits and vegetables, whole grains, and  beans. Limit foods that are high in fat and processed sugars, such as fried or sweet foods. Take an over-the-counter or prescription medicine for constipation. You will need to have a caregiver help you manage wound care and dressing changes. Your caregiver should: Wash his or her hands with soap and water before changing your dressing. If soap and water are not available, your caregiver should use hand sanitizer. Check your wound every day for signs of infection, such as: Redness, swelling, or more pain. More fluid or blood. Warmth. Pus or a bad smell. Follow any additional instructions from your health care provider on how to care for your wound, such as wound cleaning, wound flushing (irrigation), or packing your wound with a dressing. Keep all follow-up visits as told by your health care provider. This is important. If you had incision and drainage with wound packing: Return to your health care provider as instructed to have your packing material changed or removed. Keep the area dry until your packing has been removed. After the packing has been removed, you may start taking showers. If you had marsupialization: You may start taking showers the day after surgery, or when your health care provider approves. Remove your dressing before you shower, but let the water from the shower moisten your dressing before you remove it. This will make it easier to remove. Ask your health care provider when you can stop using a dressing. If you had incision and drainage without wound packing: Change your dressing as directed. Leave stitches (sutures), skin glue, or adhesive strips in place. These skin closures  may need to stay in place for 2 weeks or longer. If adhesive strip edges start to loosen and curl up, you may trim the loose edges. Do not remove adhesive strips completely unless your health care provider tells you to do that. Contact a health care provider if: You have redness, swelling, or more  pain around your wound. You have more fluid or blood coming from your wound. You have new bleeding from your wound. Your wound feels warm to the touch. There is pus or a bad smell coming from your wound. You have pain that does not get better with medicine. You have a fever or chills. You have muscle aches. You are dizzy. You feel generally sick. Summary After a procedure to drain a pilonidal cyst, it is common to have some fluid or blood coming from your wound. If you were prescribed an antibiotic medicine, take it as told by your health care provider. Do not stop taking the antibiotic even if you start to feel better. Return to your health care provider as instructed to have any packing material changed or removed. This information is not intended to replace advice given to you by your health care provider. Make sure you discuss any questions you have with your health care provider. Document Revised: 05/05/2020 Document Reviewed: 05/05/2020 Elsevier Patient Education  Mark Terry.

## 2021-12-12 NOTE — Progress Notes (Signed)
Mr. Mark Terry returns today after our last visit. He reports that the last bit of packing became a source of pain for him that he could not alleviate without its removal.  He reports that the packing was still dry on removal with some old dried blood associated with it. He wishes not to have any continued packing.  He does report he has a hand-held shower head that he can keep the area cleansed with on a regular basis and can keep the area dry.  I believe this will be sufficient maintenance considering he does not tolerate the packing well.  He asked if he could return and follow-up should he have another recurrence that initiated this process and I told him certainly. 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 We remain readily available to help this gentleman with wound care.  It is a shame he has no one else to help him.  And unfortunately he is sufficiently debilitated so as to be unable to fully care for this point of hygiene himself.  I suspect his plan to perform focused cleansing of this area and maintenance of dryness will be helpful, and hopefully prevent any recurrence or significant inflammatory issues.

## 2021-12-20 DIAGNOSIS — E7800 Pure hypercholesterolemia, unspecified: Secondary | ICD-10-CM | POA: Diagnosis not present

## 2021-12-20 DIAGNOSIS — Z951 Presence of aortocoronary bypass graft: Secondary | ICD-10-CM | POA: Diagnosis not present

## 2021-12-20 DIAGNOSIS — I5032 Chronic diastolic (congestive) heart failure: Secondary | ICD-10-CM | POA: Diagnosis not present

## 2021-12-20 DIAGNOSIS — I10 Essential (primary) hypertension: Secondary | ICD-10-CM | POA: Diagnosis not present

## 2021-12-20 DIAGNOSIS — E119 Type 2 diabetes mellitus without complications: Secondary | ICD-10-CM | POA: Diagnosis not present

## 2021-12-20 DIAGNOSIS — R600 Localized edema: Secondary | ICD-10-CM | POA: Diagnosis not present

## 2021-12-20 DIAGNOSIS — J449 Chronic obstructive pulmonary disease, unspecified: Secondary | ICD-10-CM | POA: Diagnosis not present

## 2021-12-20 DIAGNOSIS — I25118 Atherosclerotic heart disease of native coronary artery with other forms of angina pectoris: Secondary | ICD-10-CM | POA: Diagnosis not present

## 2021-12-20 DIAGNOSIS — I208 Other forms of angina pectoris: Secondary | ICD-10-CM | POA: Diagnosis not present

## 2021-12-20 DIAGNOSIS — N1832 Chronic kidney disease, stage 3b: Secondary | ICD-10-CM | POA: Diagnosis not present

## 2021-12-20 DIAGNOSIS — G4733 Obstructive sleep apnea (adult) (pediatric): Secondary | ICD-10-CM | POA: Diagnosis not present

## 2021-12-21 ENCOUNTER — Ambulatory Visit: Payer: Medicare Other | Admitting: Surgery

## 2021-12-22 ENCOUNTER — Other Ambulatory Visit: Payer: Self-pay | Admitting: Family Medicine

## 2022-01-16 ENCOUNTER — Other Ambulatory Visit: Payer: Self-pay | Admitting: Family Medicine

## 2022-01-16 DIAGNOSIS — N3941 Urge incontinence: Secondary | ICD-10-CM

## 2022-02-02 ENCOUNTER — Other Ambulatory Visit: Payer: Self-pay | Admitting: Family Medicine

## 2022-02-17 ENCOUNTER — Other Ambulatory Visit: Payer: Self-pay | Admitting: Family Medicine

## 2022-02-26 ENCOUNTER — Other Ambulatory Visit: Payer: Self-pay | Admitting: Family Medicine

## 2022-02-28 NOTE — Progress Notes (Signed)
I,[name],acting as a scribe for [name], MD.,have documented all relevant documentation on the behalf of [name], MD,as directed by  [name], MD while in the presence of [name], MD.   Established patient visit   Patient: Mark Terry.   DOB: 1935-02-19   87 y.o. Male  MRN: [id number] Visit Date: 03/02/2022  Today's healthcare provider: [name], MD   Chief Complaint  Patient presents with   Diabetes   Hypertension   Subjective    HPI  Diabetes Mellitus Type II, Follow-up  Lab Results  Component Value Date   HGBA1C 7.3 (A) 03/02/2022   HGBA1C 7.1 (H) 10/25/2021   HGBA1C 7.5 (H) 06/14/2021   Wt Readings from Last 3 Encounters:  12/12/21 211 lb (95.7 kg)  12/07/21 207 lb (93.9 kg)  12/05/21 207 lb (93.9 kg)   Last seen for diabetes 4 months ago.  Management since then includes continuing same medication. He reports good compliance with treatment. He is not having side effects.  Symptoms: No fatigue No foot ulcerations  No appetite changes No nausea  No paresthesia of the feet  No polydipsia  No polyuria No visual disturbances   No vomiting     Home blood sugar records: fasting range: 130-160  Episodes of hypoglycemia? No    Current insulin regiment: none Most Recent Eye Exam: 6 months ago Current exercise: none and stationary bike Current diet habits: in general, an "unhealthy" diet  Pertinent Labs: Lab Results  Component Value Date   CHOL 138 06/14/2021   HDL 45 06/14/2021   LDLCALC 54 06/14/2021   TRIG 249 (H) 06/14/2021   CHOLHDL 3.1 06/14/2021   Lab Results  Component Value Date   NA 141 10/25/2021   K 5.2 10/25/2021   CREATININE 1.75 (H) 10/25/2021   EGFR 38 (L) 10/25/2021   MICROALBUR 50 09/27/2020   LABMICR 21.8 10/25/2021     ---------------------------------------------------------------------------------------------------  Hypertension, follow-up  BP Readings from Last 3 Encounters:   03/02/22 117/66  12/12/21 124/78  12/07/21 127/85   Wt Readings from Last 3 Encounters:  12/12/21 211 lb (95.7 kg)  12/07/21 207 lb (93.9 kg)  12/05/21 207 lb (93.9 kg)     He was last seen for hypertension 4 months ago.  BP at that visit was 112/53. Management since that visit includes continuing same medication.  He reports good compliance with treatment. He is not having side effects.  He is following a Regular diet. He is exercising. He does not smoke.  Use of agents associated with hypertension: NSAIDS.   Outside blood pressures are checked occasionally. Symptoms: No chest pain No chest pressure  No palpitations No syncope  No dyspnea No orthopnea  No paroxysmal nocturnal dyspnea No lower extremity edema    ---------------------------------------------------------------------------------------------------  Follow up chronic kidney disease.  Lab Results  Component Value Date   NA 141 10/25/2021   K 5.2 10/25/2021   CREATININE 1.75 (H) 10/25/2021   EGFR 38 (L) 10/25/2021   GFRNONAA 33 (L) 05/30/2020   GLUCOSE 134 (H) 10/25/2021   He states he drinks several glasses of Crystal Lite every day to try to keep hydrated.    Medications: Outpatient Medications Prior to Visit  Medication Sig   Accu-Chek FastClix Lancets MISC USE TO CHECK BLOOD SUGAR  DAILY   ACCU-CHEK GUIDE test strip CHECK BLOOD SUGAR ONCE  DAILY FOR TYPE 2 DIABETES   aspirin 81 MG tablet Take 81 mg by mouth daily.  azelastine (ASTELIN) 0.1 % nasal spray Place 2 sprays into both nostrils 2 (two) times daily. Use in each nostril as directed   Blood Glucose Monitoring Suppl (ACCU-CHEK GUIDE) w/Device KIT USE AS DIRECTED DAILY   doxazosin (CARDURA) 2 MG tablet TAKE 1 TABLET BY MOUTH  DAILY   fluticasone (FLONASE) 50 MCG/ACT nasal spray USE 1 TO 2 SPRAYS IN BOTH  NOSTRILS DAILY   furosemide (LASIX) 40 MG tablet TAKE 1 TABLET BY MOUTH  DAILY   gabapentin (NEURONTIN) 300 MG capsule TAKE 1 CAPSULE BY  MOUTH  TWICE DAILY (Patient taking differently: Take 300 mg by mouth 3 (three) times daily. TAKE 1 CAPSULE BY MOUTH  TWICE DAILY)   glimepiride (AMARYL) 1 MG tablet TAKE 1 TABLET BY MOUTH  DAILY   HYDROcodone-acetaminophen (NORCO/VICODIN) 5-325 MG tablet TAKE 1 TABLET BY MOUTH TWICE DAILY AS NEEDED   ibuprofen (ADVIL,MOTRIN) 200 MG tablet Take 200 mg by mouth every 6 (six) hours as needed.   JARDIANCE 25 MG TABS tablet TAKE 1 TABLET BY MOUTH  DAILY   Lancets Misc. (ACCU-CHEK FASTCLIX LANCET) KIT Used to check glucose.  DX E11.9   loratadine (CLARITIN) 10 MG tablet Take 10 mg by mouth daily.   losartan (COZAAR) 50 MG tablet TAKE 1 TABLET BY MOUTH  DAILY   meloxicam (MOBIC) 15 MG tablet Take 15 mg by mouth daily.   metFORMIN (GLUCOPHAGE-XR) 500 MG 24 hr tablet TAKE 1 TABLET BY MOUTH  TWICE DAILY   metoprolol tartrate (LOPRESSOR) 25 MG tablet TAKE 1 TABLET BY MOUTH TWICE  DAILY   Multiple Vitamin (MULTIVITAMIN WITH MINERALS) TABS Take 1 tablet by mouth daily.   omeprazole (PRILOSEC) 40 MG capsule TAKE 1 CAPSULE BY MOUTH  DAILY   oxybutynin (DITROPAN) 5 MG tablet TAKE 1 TABLET BY MOUTH  TWICE DAILY   potassium chloride SA (KLOR-CON M) 20 MEQ tablet TAKE 1 TABLET BY MOUTH  DAILY   Silver-Carboxymethylcellulose (AQUACEL-AG HYDROFIBER 3/4"X18") 3/4"X18" MISC Apply 1 Dose topically in the morning and at bedtime.   simvastatin (ZOCOR) 40 MG tablet TAKE 1 TABLET BY MOUTH AT  BEDTIME   traZODone (DESYREL) 100 MG tablet TAKE 1/2 TO 1 TABLET BY  MOUTH AT BEDTIME AS NEEDED  FOR SLEEP   No facility-administered medications prior to visit.    Review of Systems  Constitutional:  Negative for appetite change, chills and fever.  Respiratory:  Negative for chest tightness, shortness of breath and wheezing.   Cardiovascular:  Positive for leg swelling. Negative for chest pain and palpitations.  Gastrointestinal:  Negative for abdominal pain, nausea and vomiting.  Musculoskeletal:  Positive for arthralgias  (feet swelling).  Neurological:  Positive for weakness.       Objective    BP 117/66 (BP Location: Right Arm, Patient Position: Sitting, Cuff Size: Large)   Pulse 71   Temp 98.6 F (37 C) (Oral)   Resp 16   SpO2 97% Comment: room air   Physical Exam   General: Appearance:    Obese male in no acute distress. Sitting in wheelchair in NAD  Eyes:    PERRL, conjunctiva/corneas clear, EOM's intact       Lungs:     Clear to auscultation bilaterally, respirations unlabored  Heart:    Normal heart rate. Normal rhythm. No murmurs, rubs, or gallops.    MS:   All extremities are intact.  2+ bilateral edema for feet and lower legs.   Neurologic:   Awake, alert, oriented x 3. No apparent focal neurological  defect.        Results for orders placed or performed in visit on 03/02/22  POCT HgB A1C  Result Value Ref Range   Hemoglobin A1C 7.3 (A) 4.0 - 5.6 %   Est. average glucose Bld gHb Est-mCnc 163     Assessment & Plan     1. Diabetes mellitus with nephropathy (HCC) A1c is stable. Continue current medications.    2. Chronic kidney disease, stage 3b ([place])  - Renal function panel  3. At risk for falling He is using recumbent bike daily and encouraged to ambulate with [place]. Letter for insurance that it is medically necessary for him to wear monitored medical alert device.   4. Essential (primary) hypertension Well controlled.   5. Cervical radiculitis He has increased gabapentin to TID which he states has helped somewhat with pain control. Needs new prescription sent to his mail order with new instructions       The entirety of the information documented in the History of Present Illness, Review of Systems and Physical Exam were personally obtained by me. Portions of this information were initially documented by the CMA and reviewed by me for thoroughness and accuracy.     [name], MD  [facility] [phone] (phone) [phone] (fax)  [place]

## 2022-03-02 ENCOUNTER — Encounter: Payer: Self-pay | Admitting: Family Medicine

## 2022-03-02 ENCOUNTER — Ambulatory Visit (INDEPENDENT_AMBULATORY_CARE_PROVIDER_SITE_OTHER): Payer: Medicare Other | Admitting: Family Medicine

## 2022-03-02 VITALS — BP 117/66 | HR 71 | Temp 98.6°F | Resp 16

## 2022-03-02 DIAGNOSIS — M5412 Radiculopathy, cervical region: Secondary | ICD-10-CM

## 2022-03-02 DIAGNOSIS — N1832 Chronic kidney disease, stage 3b: Secondary | ICD-10-CM

## 2022-03-02 DIAGNOSIS — Z9989 Dependence on other enabling machines and devices: Secondary | ICD-10-CM | POA: Diagnosis not present

## 2022-03-02 DIAGNOSIS — I10 Essential (primary) hypertension: Secondary | ICD-10-CM | POA: Diagnosis not present

## 2022-03-02 DIAGNOSIS — E1121 Type 2 diabetes mellitus with diabetic nephropathy: Secondary | ICD-10-CM | POA: Diagnosis not present

## 2022-03-02 DIAGNOSIS — Z9181 History of falling: Secondary | ICD-10-CM

## 2022-03-02 DIAGNOSIS — G4733 Obstructive sleep apnea (adult) (pediatric): Secondary | ICD-10-CM | POA: Diagnosis not present

## 2022-03-02 LAB — POCT GLYCOSYLATED HEMOGLOBIN (HGB A1C)
Est. average glucose Bld gHb Est-mCnc: 163
Hemoglobin A1C: 7.3 % — AB (ref 4.0–5.6)

## 2022-03-02 MED ORDER — GABAPENTIN 300 MG PO CAPS: 300.0000 mg | ORAL_CAPSULE | Freq: Three times a day (TID) | ORAL | 3 refills | Status: DC

## 2022-03-02 NOTE — Patient Instructions (Signed)
.   Please review the attached list of medications and notify my office if there are any errors.   . Please bring all of your medications to every appointment so we can make sure that our medication list is the same as yours.   

## 2022-03-03 ENCOUNTER — Other Ambulatory Visit: Payer: Self-pay | Admitting: Family Medicine

## 2022-03-03 DIAGNOSIS — E1122 Type 2 diabetes mellitus with diabetic chronic kidney disease: Secondary | ICD-10-CM

## 2022-03-03 LAB — RENAL FUNCTION PANEL
Albumin: 4 g/dL (ref 3.7–4.7)
BUN/Creatinine Ratio: 15 (ref 10–24)
BUN: 28 mg/dL — ABNORMAL HIGH (ref 8–27)
CO2: 22 mmol/L (ref 20–29)
Calcium: 9.6 mg/dL (ref 8.6–10.2)
Chloride: 102 mmol/L (ref 96–106)
Creatinine, Ser: 1.85 mg/dL — ABNORMAL HIGH (ref 0.76–1.27)
Glucose: 118 mg/dL — ABNORMAL HIGH (ref 70–99)
Phosphorus: 3.8 mg/dL (ref 2.8–4.1)
Potassium: 4.5 mmol/L (ref 3.5–5.2)
Sodium: 140 mmol/L (ref 134–144)
eGFR: 35 mL/min/{1.73_m2} — ABNORMAL LOW (ref >59)

## 2022-03-05 ENCOUNTER — Ambulatory Visit: Payer: Self-pay

## 2022-03-05 DIAGNOSIS — M47816 Spondylosis without myelopathy or radiculopathy, lumbar region: Secondary | ICD-10-CM | POA: Diagnosis not present

## 2022-03-05 DIAGNOSIS — M5412 Radiculopathy, cervical region: Secondary | ICD-10-CM | POA: Diagnosis not present

## 2022-03-05 DIAGNOSIS — M5126 Other intervertebral disc displacement, lumbar region: Secondary | ICD-10-CM | POA: Diagnosis not present

## 2022-03-05 DIAGNOSIS — M4802 Spinal stenosis, cervical region: Secondary | ICD-10-CM | POA: Diagnosis not present

## 2022-03-05 DIAGNOSIS — M48062 Spinal stenosis, lumbar region with neurogenic claudication: Secondary | ICD-10-CM | POA: Diagnosis not present

## 2022-03-05 DIAGNOSIS — M5416 Radiculopathy, lumbar region: Secondary | ICD-10-CM | POA: Diagnosis not present

## 2022-03-05 NOTE — Chronic Care Management (AMB) (Signed)
   03/05/2022  Ma Rings. [birth year] [id number]   Documentation encounter created to complete case transition. The care management team will follow for care coordination as needed.   [place] [facility] [phone]

## 2022-03-11 ENCOUNTER — Other Ambulatory Visit: Payer: Self-pay | Admitting: Family Medicine

## 2022-03-11 DIAGNOSIS — E1121 Type 2 diabetes mellitus with diabetic nephropathy: Secondary | ICD-10-CM

## 2022-03-20 DIAGNOSIS — M5412 Radiculopathy, cervical region: Secondary | ICD-10-CM | POA: Diagnosis not present

## 2022-03-20 DIAGNOSIS — M4802 Spinal stenosis, cervical region: Secondary | ICD-10-CM | POA: Diagnosis not present

## 2022-03-28 DIAGNOSIS — M47816 Spondylosis without myelopathy or radiculopathy, lumbar region: Secondary | ICD-10-CM | POA: Diagnosis not present

## 2022-04-11 DIAGNOSIS — M47816 Spondylosis without myelopathy or radiculopathy, lumbar region: Secondary | ICD-10-CM | POA: Diagnosis not present

## 2022-05-03 ENCOUNTER — Other Ambulatory Visit: Payer: Self-pay | Admitting: Family Medicine

## 2022-05-03 MED ORDER — POTASSIUM CHLORIDE CRYS ER 20 MEQ PO TBCR: 20.0000 meq | EXTENDED_RELEASE_TABLET | Freq: Every day | ORAL | 3 refills | Status: DC

## 2022-05-03 NOTE — Telephone Encounter (Signed)
Copied from [place] [id number]. Topic: General - Other >> May 03, 2022  9:24 AM [name] wrote: Reason for CRM: Medication Refill - Medication: potassium chloride SA (KLOR-CON M) 20 MEQ tablet [[id number]]   Has the patient contacted their pharmacy? Yes.  The patient has been directed to contact their PCP  (Agent: If no, request that the patient contact the pharmacy for the refill. If patient does not wish to contact the pharmacy document the reason why and proceed with request.) (Agent: If yes, when and what did the pharmacy advise?)  Preferred Pharmacy (with phone number or street name): [facility] ([place], [facility] [address] [place] [address] Phone: [phone] Fax: [phone] Hours: Not open 24 hours   Has the patient been seen for an appointment in the last year OR does the patient have an upcoming appointment? Yes.    Agent: Please be advised that RX refills may take up to 3 business days. We ask that you follow-up with your pharmacy.

## 2022-05-03 NOTE — Telephone Encounter (Signed)
Abnormal labs reviewed. Requested Prescriptions  Pending Prescriptions Disp Refills  . potassium chloride SA (KLOR-CON M) 20 MEQ tablet 90 tablet 4    Sig: Take 1 tablet (20 mEq total) by mouth daily.     Endocrinology:  Minerals - Potassium Supplementation Failed - 05/03/2022 12:18 PM      Failed - Cr in normal range and within 360 days    Creat  Date Value Ref Range Status  05/13/2017 1.70 (H) 0.70 - 1.11 mg/dL Final    Comment:    For patients >67 years of age, the reference limit for Creatinine is approximately 13% higher for people identified as African-American. .    Creatinine, Ser  Date Value Ref Range Status  03/02/2022 1.85 (H) 0.76 - 1.27 mg/dL Final         Passed - K in normal range and within 360 days    Potassium  Date Value Ref Range Status  03/02/2022 4.5 3.5 - 5.2 mmol/L Final         Passed - Valid encounter within last 12 months    Recent Outpatient Visits          2 months ago Diabetes mellitus with nephropathy [facility])   [facility] Birdie Sons, MD   6 months ago Diabetes mellitus with nephropathy [facility])   [facility] Birdie Sons, MD   10 months ago Need for influenza vaccination   [facility] Birdie Sons, MD   1 year ago Type 2 diabetes mellitus with chronic kidney disease, without long-term current use of insulin, unspecified CKD stage [facility])   [facility] Birdie Sons, MD   1 year ago Diabetes mellitus with nephropathy [facility])   [facility] Birdie Sons, MD      Future Appointments            In 2 months Fisher, Kirstie Peri, MD [facility] [place]

## 2022-05-07 ENCOUNTER — Other Ambulatory Visit: Payer: Self-pay | Admitting: Family Medicine

## 2022-05-07 DIAGNOSIS — M5126 Other intervertebral disc displacement, lumbar region: Secondary | ICD-10-CM | POA: Diagnosis not present

## 2022-05-07 DIAGNOSIS — M48062 Spinal stenosis, lumbar region with neurogenic claudication: Secondary | ICD-10-CM | POA: Diagnosis not present

## 2022-05-07 DIAGNOSIS — M5416 Radiculopathy, lumbar region: Secondary | ICD-10-CM | POA: Diagnosis not present

## 2022-05-07 DIAGNOSIS — M5030 Other cervical disc degeneration, unspecified cervical region: Secondary | ICD-10-CM | POA: Diagnosis not present

## 2022-05-07 DIAGNOSIS — M5412 Radiculopathy, cervical region: Secondary | ICD-10-CM | POA: Diagnosis not present

## 2022-05-07 DIAGNOSIS — M4802 Spinal stenosis, cervical region: Secondary | ICD-10-CM | POA: Diagnosis not present

## 2022-05-07 DIAGNOSIS — M47816 Spondylosis without myelopathy or radiculopathy, lumbar region: Secondary | ICD-10-CM | POA: Diagnosis not present

## 2022-05-18 DIAGNOSIS — M47816 Spondylosis without myelopathy or radiculopathy, lumbar region: Secondary | ICD-10-CM | POA: Diagnosis not present

## 2022-05-22 DIAGNOSIS — Z85828 Personal history of other malignant neoplasm of skin: Secondary | ICD-10-CM | POA: Diagnosis not present

## 2022-05-22 DIAGNOSIS — L570 Actinic keratosis: Secondary | ICD-10-CM | POA: Diagnosis not present

## 2022-05-22 DIAGNOSIS — L821 Other seborrheic keratosis: Secondary | ICD-10-CM | POA: Diagnosis not present

## 2022-05-22 DIAGNOSIS — D225 Melanocytic nevi of trunk: Secondary | ICD-10-CM | POA: Diagnosis not present

## 2022-05-22 DIAGNOSIS — L298 Other pruritus: Secondary | ICD-10-CM | POA: Diagnosis not present

## 2022-05-22 DIAGNOSIS — Z859 Personal history of malignant neoplasm, unspecified: Secondary | ICD-10-CM | POA: Diagnosis not present

## 2022-05-22 DIAGNOSIS — L578 Other skin changes due to chronic exposure to nonionizing radiation: Secondary | ICD-10-CM | POA: Diagnosis not present

## 2022-05-28 ENCOUNTER — Other Ambulatory Visit: Payer: Self-pay | Admitting: Family Medicine

## 2022-06-12 DIAGNOSIS — M47816 Spondylosis without myelopathy or radiculopathy, lumbar region: Secondary | ICD-10-CM | POA: Diagnosis not present

## 2022-06-20 ENCOUNTER — Ambulatory Visit (INDEPENDENT_AMBULATORY_CARE_PROVIDER_SITE_OTHER): Payer: Medicare Other

## 2022-06-20 VITALS — Ht 60.0 in | Wt 207.0 lb

## 2022-06-20 DIAGNOSIS — I10 Essential (primary) hypertension: Secondary | ICD-10-CM | POA: Diagnosis not present

## 2022-06-20 DIAGNOSIS — Z951 Presence of aortocoronary bypass graft: Secondary | ICD-10-CM | POA: Diagnosis not present

## 2022-06-20 DIAGNOSIS — R600 Localized edema: Secondary | ICD-10-CM | POA: Diagnosis not present

## 2022-06-20 DIAGNOSIS — I25118 Atherosclerotic heart disease of native coronary artery with other forms of angina pectoris: Secondary | ICD-10-CM | POA: Diagnosis not present

## 2022-06-20 DIAGNOSIS — Z0000 Encounter for general adult medical examination without abnormal findings: Secondary | ICD-10-CM | POA: Diagnosis not present

## 2022-06-20 DIAGNOSIS — I5032 Chronic diastolic (congestive) heart failure: Secondary | ICD-10-CM | POA: Diagnosis not present

## 2022-06-20 NOTE — Progress Notes (Signed)
Virtual Visit via Telephone Note  I connected with  Mark Terry. on 06/20/22 at  2:45 PM EST by telephone and verified that I am speaking with the correct person using two identifiers.  Location: Patient: home Provider: [facility] Persons participating in the virtual visit: [place]   I discussed the limitations, risks, security and privacy concerns of performing an evaluation and management service by telephone and the availability of in person appointments. The patient expressed understanding and agreed to proceed.  Interactive audio and video telecommunications were attempted between this nurse and patient, however failed, due to patient having technical difficulties OR patient did not have access to video capability.  We continued and completed visit with audio only.  Some vital signs may be absent or patient reported.   Dionisio David, LPN  Subjective:   Mark Huss. is a 87 y.o. male who presents for Medicare Annual/Subsequent preventive examination.  Review of Systems     Cardiac Risk Factors include: advanced age (>63mn, >>37women);diabetes mellitus;dyslipidemia;male gender;hypertension     Objective:    Today's Vitals   06/20/22 1445  PainSc: 6    There is no height or weight on file to calculate BMI.     06/20/2022    2:59 PM 10/12/2019    9:59 AM 08/26/2018    8:34 AM 04/10/2018    8:16 AM 01/03/2018    1:13 PM 11/18/2017    8:57 AM 01/08/2017    9:28 AM  Advanced Directives  Does Patient Have a Medical Advance Directive? No Yes No Yes Yes Yes Yes  Type of ASocial research officer, governmentLiving will  [place]upiter FarmsLiving will [place]Living will  [place] will  Copy of [place] Chart?  No - copy requested  No - copy requested No - copy requested  No - copy requested  Would patient like information on creating a medical advance directive?  No - Patient declined  No - Patient declined        Current Medications (verified) Outpatient Encounter Medications as of 06/20/2022  Medication Sig   Accu-Chek FastClix Lancets MISC USE TO CHECK BLOOD SUGAR  DAILY   ACCU-CHEK GUIDE test strip CHECK BLOOD SUGAR ONCE  DAILY FOR TYPE 2 DIABETES   aspirin 81 MG tablet Take 81 mg by mouth daily.   azelastine (ASTELIN) 0.1 % nasal spray Place 2 sprays into both nostrils 2 (two) times daily. Use in each nostril as directed   Blood Glucose Monitoring Suppl (ACCU-CHEK GUIDE) w/Device KIT USE AS DIRECTED DAILY   fluticasone (FLONASE) 50 MCG/ACT nasal spray USE 1 TO 2 SPRAYS IN BOTH  NOSTRILS DAILY   furosemide (LASIX) 40 MG tablet TAKE 1 TABLET BY MOUTH  DAILY   gabapentin (NEURONTIN) 300 MG capsule Take 1 capsule (300 mg total) by mouth 3 (three) times daily.   glimepiride (AMARYL) 1 MG tablet TAKE 1 TABLET BY MOUTH  DAILY   HYDROcodone-acetaminophen (NORCO/VICODIN) 5-325 MG tablet TAKE 1 TABLET BY MOUTH TWICE DAILY AS NEEDED   JARDIANCE 25 MG TABS tablet TAKE 1 TABLET BY MOUTH  DAILY   Lancets Misc. (ACCU-CHEK FASTCLIX LANCET) KIT Used to check glucose.  DX E11.9   loratadine (CLARITIN) 10 MG tablet Take 10 mg by mouth daily.   losartan (COZAAR) 50 MG tablet TAKE 1 TABLET BY MOUTH  DAILY   metFORMIN (GLUCOPHAGE-XR) 500 MG 24 hr tablet TAKE 1 TABLET BY  MOUTH  TWICE DAILY   metoprolol tartrate (LOPRESSOR) 25 MG tablet TAKE 1 TABLET BY MOUTH TWICE  DAILY   Multiple Vitamin (MULTIVITAMIN WITH MINERALS) TABS Take 1 tablet by mouth daily.   omeprazole (PRILOSEC) 40 MG capsule TAKE 1 CAPSULE BY MOUTH  DAILY   oxybutynin (DITROPAN) 5 MG tablet TAKE 1 TABLET BY MOUTH  TWICE DAILY   potassium chloride SA (KLOR-CON M) 20 MEQ tablet Take 1 tablet (20 mEq total) by mouth daily.   Silver-Carboxymethylcellulose (AQUACEL-AG HYDROFIBER 3/4"X18") 3/4"X18" MISC Apply 1 Dose topically in the morning and at bedtime.   simvastatin (ZOCOR) 40 MG tablet TAKE 1 TABLET BY  MOUTH AT  BEDTIME   traZODone (DESYREL) 100 MG tablet TAKE 1/2 TO 1 TABLET BY  MOUTH AT BEDTIME AS NEEDED  FOR SLEEP   Vitamin D, Ergocalciferol, (DRISDOL) 1.25 MG (50000 UNIT) CAPS capsule Take 50,000 Units by mouth every 7 (seven) days.   doxazosin (CARDURA) 2 MG tablet TAKE 1 TABLET BY MOUTH  DAILY (Patient not taking: Reported on 06/20/2022)   ibuprofen (ADVIL,MOTRIN) 200 MG tablet Take 200 mg by mouth every 6 (six) hours as needed. (Patient not taking: Reported on 06/20/2022)   meloxicam (MOBIC) 15 MG tablet Take 15 mg by mouth daily. (Patient not taking: Reported on 06/20/2022)   No facility-administered encounter medications on file as of 06/20/2022.    Allergies (verified) Propoxyphene, Codeine sulfate, Darvon [propoxyphene hcl], Demerol [meperidine], and Oxycodone   History: Past Medical History:  Diagnosis Date   Anemia    Bruises easily    Diabetes mellitus without complication (HCC)    H/O esophagogastroduodenoscopy    H/O hiatal hernia    History of blood transfusion    no reaction noted   History of bronchitis    last time a couple of yrs ago   History of gastric ulcer    History of MRSA infection    Hyperlipidemia    Hypertension    Leg cramps    takes quinine prn   Melanoma ([place])    Panic attacks    Pneumonia    hx of last time a couple of yrs ago   PONV (postoperative nausea and vomiting)    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 laminectomy Dr. Annette Stable 2008 cervical fusion x 2   bilateral cataract surgery     CHOLECYSTECTOMY  1970    COLONOSCOPY     CORONARY ANGIOPLASTY     CORONARY ARTERY BYPASS GRAFT  2000    3 vessels   ESOPHAGOGASTRODUODENOSCOPY (EGD) WITH PROPOFOL N/A 11/18/2017   Procedure: ESOPHAGOGASTRODUODENOSCOPY (EGD) WITH PROPOFOL;  Surgeon: Jonathon Bellows, MD;  Location: [facility] ENDOSCOPY;  Service: Gastroenterology;  Laterality: N/A;   JOINT REPLACEMENT     MOHS SURGERY Left 12/30/2018   [facility], Onalee Hua, MD   NECK SURGERY     x 2   prostate laser surgery     x 2   REPLACEMENT TOTAL KNEE BILATERAL  '86 and 2000   REVERSE SHOULDER ARTHROPLASTY  05/30/2012   Procedure: REVERSE SHOULDER ARTHROPLASTY;  Surgeon: Remo Lipps  Orlena Sheldon, MD;  Location: [place];  Service: Orthopedics;  Laterality: Right;  right reverse shoulder arthroplasty   SQUAMOUS CELL CARCINOMA EXCISION  05/01/2019   mid superior forehead/ [facility]/ Dr. Roderic Palau L. Cook   TOTAL HIP ARTHROPLASTY Right 2012   [place]   Family History  Problem Relation Age of Onset   Alzheimer's disease Mother    Heart attack Father    Bipolar disorder Brother    Kidney disease Neg Hx    Prostate cancer Neg Hx    Social History   Socioeconomic History   Marital status: Divorced    Spouse name: Not on file   Number of children: 2   Years of education: 12   Highest education level: 12th grade  Occupational History   Occupation: Retired  Tobacco Use   Smoking status: Former    Packs/day: 1.00    Years: 25.00    Total pack years: 25.00    Types: Cigarettes    Quit date: 07/09/1968    Years since quitting: 53.9    Passive exposure: Past   Smokeless tobacco: Never   Tobacco comments:    quit at age 22  Vaping Use   Vaping Use: Never used  Substance and Sexual Activity   Alcohol use: No    Alcohol/week: 0.0 standard drinks of alcohol   Drug use: No   Sexual activity: Never  Other Topics Concern   Not on file  Social History Narrative   Not on file   Social Determinants of Health   Financial Resource Strain: Low Risk  (06/20/2022)   Overall  Financial Resource Strain (CARDIA)    Difficulty of Paying Living Expenses: Not hard at all  Food Insecurity: No Food Insecurity (06/20/2022)   Hunger Vital Sign    Worried About Running Out of Food in the Last Year: Never true    [place] in the Last Year: Never true  Transportation Needs: No Transportation Needs (06/20/2022)   PRAPARE - Hydrologist (Medical): No    Lack of Transportation (Non-Medical): No  Physical Activity: Sufficiently Active (06/20/2022)   Exercise Vital Sign    Days of Exercise per Week: 5 days    Minutes of Exercise per Session: 30 min  Stress: No Stress Concern Present (06/20/2022)   [place]    Feeling of Stress : Not at all  Social Connections: Moderately Isolated (06/20/2022)   Social Connection and Isolation Panel [NHANES]    Frequency of Communication with Friends and Family: More than three times a week    Frequency of Social Gatherings with Friends and Family: More than three times a week    Attends Religious Services: More than 4 times per year    Active Member of [employer] or Organizations: No    Attends Music therapist: Never    Marital Status: Divorced    Tobacco Counseling Counseling given: Not Answered Tobacco comments: quit at age 59   Clinical Intake:  Pre-visit preparation completed: Yes  Pain : 0-10 Pain Score: 6  Pain Type: Neuropathic pain, Chronic pain Pain Location: Back Pain Relieving Factors: had shot in back  Pain Relieving Factors: had shot in back  Diabetes: Yes CBG done?: No Did pt. bring in CBG monitor from home?: No  How often do you need to have someone help you when you read instructions, pamphlets, or other written materials from your doctor or pharmacy?: 1 -  Never  Diabetic?yes Nutrition Risk Assessment:  Has the patient had any N/V/D within the last 2 months?  Yes  Does the patient have any  non-healing wounds?  No  Has the patient had any unintentional weight loss or weight gain?  No   Diabetes:  Is the patient diabetic?  Yes  If diabetic, was a CBG obtained today?  No  Did the patient bring in their glucometer from home?  No  How often do you monitor your CBG's? Every day.   Financial Strains and Diabetes Management:  Are you having any financial strains with the device, your supplies or your medication? No .  Does the patient want to be seen by Chronic Care Management for management of their diabetes?  No  Would the patient like to be referred to a Nutritionist or for Diabetic Management?  No   Diabetic Exams:  Diabetic Eye Exam: Completed 01/26/21.  Pt has been advised about the importance in completing this exam.   Diabetic Foot Exam: Completed 01/08/17. Pt has been advised about the importance in completing this exam. .    Interpreter Needed?: No  Information entered by :: Kirke Shaggy, LPN   Activities of Daily Living    06/20/2022    3:01 PM 03/02/2022    8:19 AM  In your present state of health, do you have any difficulty performing the following activities:  Hearing? 1 1  Vision? 0 1  Difficulty concentrating or making decisions? 0 0  Walking or climbing stairs? 1 1  Dressing or bathing? 0 1  Doing errands, shopping? 0 1  Preparing Food and eating ? N   Using the Toilet? N   In the past six months, have you accidently leaked urine? N   Do you have problems with loss of bowel control? N   Managing your Medications? N   Managing your Finances? N   Housekeeping or managing your Housekeeping? N     Patient Care Team: Birdie Sons, MD as PCP - General (Family Medicine) Yolonda Kida, MD as Consulting Physician (Cardiology) Erby Pian, MD as Referring Physician (Specialist) Sharlet Salina, MD as Referring Physician (Physical Medicine and Rehabilitation) Ree Edman, MD as Consulting Physician (Dermatology) Rory Percy, MD as Referring Physician (Dermatology) Pa, [place] (Optometry)  Indicate any recent Medical Services you may have received from other than [facility] providers in the past year (date may be approximate).     Assessment:   This is a routine wellness examination for Mark Terry.  Hearing/Vision screen Hearing Screening - Comments:: Wears aids Vision Screening - Comments:: Wears glasses- [facility]   Dietary issues and exercise activities discussed: Current Exercise Habits: Home exercise routine, Type of exercise: walking, Time (Minutes): 30, Frequency (Times/Week): 5, Weekly Exercise (Minutes/Week): 150, Intensity: Mild   Goals Addressed             This Visit's Progress    DIET - EAT MORE FRUITS AND VEGETABLES         Depression Screen    06/20/2022    2:54 PM 03/02/2022    8:20 AM 06/14/2021   10:01 AM 10/11/2020   11:32 AM 09/27/2020    8:16 AM 09/22/2019    8:31 AM 08/26/2018    8:34 AM  PHQ 2/9 Scores  PHQ - 2 Score 0 0 0 0 0 0 0  PHQ- 9 Score 0 0 3  6 0     Fall Risk    06/20/2022  3:00 PM 03/02/2022    8:17 AM 12/05/2021   10:58 AM 11/21/2021   10:46 AM 06/14/2021   10:02 AM  Fall Risk   Falls in the past year? 1 1 0 0 1  Number falls in past yr: 0 1   1  Injury with Fall? 0 0   0  Risk for fall due to : History of fall(s) History of fall(s)   History of fall(s)  Follow up Falls evaluation completed;Falls prevention discussed Falls prevention discussed   Falls prevention discussed    FALL RISK PREVENTION PERTAINING TO THE HOME:  Any stairs in or around the home? No  If so, are there any without handrails? No  Home free of loose throw rugs in walkways, pet beds, electrical cords, etc? Yes  Adequate lighting in your home to reduce risk of falls? Yes   ASSISTIVE DEVICES UTILIZED TO PREVENT FALLS:  Life alert? No  Use of a cane, [place] or w/c? Yes  Grab bars in the bathroom? Yes  Shower chair or bench in shower? Yes  Elevated toilet seat or a  handicapped toilet? Yes     Cognitive Function:        06/20/2022    3:08 PM 01/08/2017    9:34 AM  6CIT Screen  What Year? 0 points 0 points  What month? 0 points 0 points  What time? 0 points 0 points  Count back from 20 0 points 0 points  Months in reverse 0 points 0 points  Repeat phrase 4 points 2 points  Total Score 4 points 2 points    Immunizations Immunization History  Administered Date(s) Administered   Fluad Quad(high Dose 65+) 03/25/2019, 05/30/2020, 06/14/2021   Influenza, High Dose Seasonal PF 04/23/2015, 03/31/2016, 05/02/2017, 04/02/2018, 04/24/2022   Influenza-Unspecified 03/09/2014   Moderna SARS-COV2 Booster Vaccination 05/10/2020   PFIZER(Purple Top)SARS-COV-2 Vaccination 08/04/2019, 08/25/2019   Pneumococcal Conjugate-13 03/16/2014   Pneumococcal Polysaccharide-23 06/12/2000, 01/27/2009   Td 02/25/2001   Tdap 05/14/2012   Zoster Recombinat (Shingrix) 03/14/2018, 05/19/2018   Zoster, Live 07/16/2006    TDAP status: Due, Education has been provided regarding the importance of this vaccine. Advised may receive this vaccine at local pharmacy or Health Dept. Aware to provide a copy of the vaccination record if obtained from local pharmacy or Health Dept. Verbalized acceptance and understanding.  Flu Vaccine status: Up to date  Pneumococcal vaccine status: Up to date  Covid-19 vaccine status: Completed vaccines  Qualifies for Shingles Vaccine? Yes   Zostavax completed Yes   Shingrix Completed?: Yes  Screening Tests Health Maintenance  Topic Date Due   FOOT EXAM  01/08/2018   COVID-19 Vaccine (3 - Pfizer risk series) 06/07/2020   OPHTHALMOLOGY EXAM  01/26/2022   DTaP/Tdap/Td (3 - Td or Tdap) 05/14/2022   HEMOGLOBIN A1C  09/02/2022   Diabetic kidney evaluation - Urine ACR  10/26/2022   Diabetic kidney evaluation - eGFR measurement  03/03/2023   Medicare Annual Wellness (AWV)  06/21/2023   Pneumonia Vaccine 1+ Years old  Completed   INFLUENZA  VACCINE  Completed   Zoster Vaccines- Shingrix  Completed   HPV VACCINES  Aged Out   COLONOSCOPY (Pts 45-26yr Insurance coverage will need to be confirmed)  Discontinued    Health Maintenance  Health Maintenance Due  Topic Date Due   FOOT EXAM  01/08/2018   COVID-19 Vaccine (3 - Pfizer risk series) 06/07/2020   OPHTHALMOLOGY EXAM  01/26/2022   DTaP/Tdap/Td (3 - Td or Tdap) 05/14/2022  Colorectal cancer screening: No longer required.   Lung Cancer Screening: (Low Dose CT Chest recommended if Age 52-80 years, 30 pack-year currently smoking OR have quit w/in 15years.) does not qualify.   Additional Screening:  Hepatitis C Screening: does not qualify; Completed no  Vision Screening: Recommended annual ophthalmology exams for early detection of glaucoma and other disorders of the eye. Is the patient up to date with their annual eye exam?  Yes  Who is the provider or what is the name of the office in which the patient attends annual eye exams? [place] If pt is not established with a provider, would they like to be referred to a provider to establish care? No .   Dental Screening: Recommended annual dental exams for proper oral hygiene  Community Resource Referral / Chronic Care Management: CRR required this visit?  No   CCM required this visit?  No      Plan:     I have personally reviewed and noted the following in the patient's chart:   Medical and social history Use of alcohol, tobacco or illicit drugs  Current medications and supplements including opioid prescriptions. Patient is currently taking opioid prescriptions. Information provided to patient regarding non-opioid alternatives. Patient advised to discuss non-opioid treatment plan with their provider. Functional ability and status Nutritional status Physical activity Advanced directives List of other physicians Hospitalizations, surgeries, and ER visits in previous 12 months Vitals Screenings to include  cognitive, depression, and falls Referrals and appointments  In addition, I have reviewed and discussed with patient certain preventive protocols, quality metrics, and best practice recommendations. A written personalized care plan for preventive services as well as general preventive health recommendations were provided to patient.     Dionisio David, LPN   [id number]   Nurse Notes: none

## 2022-06-20 NOTE — Patient Instructions (Signed)
Mark Terry , Thank you for taking time to come for your Medicare Wellness Visit. I appreciate your ongoing commitment to your health goals. Please review the following plan we discussed and let me know if I can assist you in the future.   Screening recommendations/referrals: Colonoscopy: aged out Recommended yearly ophthalmology/optometry visit for glaucoma screening and checkup Recommended yearly dental visit for hygiene and checkup  Vaccinations: Influenza vaccine: had one this year Pneumococcal vaccine: 03/16/14 Tdap vaccine: 05/14/12, due if have injury Shingles vaccine: Zostavax 07/16/06   Shingrix 03/14/18, 05/19/18   Covid-19: 08/04/19, 08/25/19, 05/10/20  Advanced directives: no  Conditions/risks identified: none  Next appointment: Follow up in one year for your annual wellness visit. 06/25/23 @ 1 pm by phone  Preventive Care 65 Years and Older, Male Preventive care refers to lifestyle choices and visits with your health care provider that can promote health and wellness. What does preventive care include? A yearly physical exam. This is also called an annual well check. Dental exams once or twice a year. Routine eye exams. Ask your health care provider how often you should have your eyes checked. Personal lifestyle choices, including: Daily care of your teeth and gums. Regular physical activity. Eating a healthy diet. Avoiding tobacco and drug use. Limiting alcohol use. Practicing safe sex. Taking low doses of aspirin every day. Taking vitamin and mineral supplements as recommended by your health care provider. What happens during an annual well check? The services and screenings done by your health care provider during your annual well check will depend on your age, overall health, lifestyle risk factors, and family history of disease. Counseling  Your health care provider may ask you questions about your: Alcohol use. Tobacco use. Drug use. Emotional well-being. Home and  relationship well-being. Sexual activity. Eating habits. History of falls. Memory and ability to understand (cognition). Work and work Statistician. Screening  You may have the following tests or measurements: Height, weight, and BMI. Blood pressure. Lipid and cholesterol levels. These may be checked every 5 years, or more frequently if you are over 58 years old. Skin check. Lung cancer screening. You may have this screening every year starting at age 58 if you have a 30-pack-year history of smoking and currently smoke or have quit within the past 15 years. Fecal occult blood test (FOBT) of the stool. You may have this test every year starting at age 47. Flexible sigmoidoscopy or colonoscopy. You may have a sigmoidoscopy every 5 years or a colonoscopy every 10 years starting at age 69. Prostate cancer screening. Recommendations will vary depending on your family history and other risks. Hepatitis C blood test. Hepatitis B blood test. Sexually transmitted disease (STD) testing. Diabetes screening. This is done by checking your blood sugar (glucose) after you have not eaten for a while (fasting). You may have this done every 1-3 years. Abdominal aortic aneurysm (AAA) screening. You may need this if you are a current or former smoker. Osteoporosis. You may be screened starting at age 26 if you are at high risk. Talk with your health care provider about your test results, treatment options, and if necessary, the need for more tests. Vaccines  Your health care provider may recommend certain vaccines, such as: Influenza vaccine. This is recommended every year. Tetanus, diphtheria, and acellular pertussis (Tdap, Td) vaccine. You may need a Td booster every 10 years. Zoster vaccine. You may need this after age 47. Pneumococcal 13-valent conjugate (PCV13) vaccine. One dose is recommended after age 32. Pneumococcal polysaccharide (  PPSV23) vaccine. One dose is recommended after age 104. Talk to your  health care provider about which screenings and vaccines you need and how often you need them. This information is not intended to replace advice given to you by your health care provider. Make sure you discuss any questions you have with your health care provider. Document Released: 07/22/2015 Document Revised: 03/14/2016 Document Reviewed: 04/26/2015 Elsevier Interactive Patient Education  2017 [place] Prevention in the Home Falls can cause injuries. They can happen to people of all ages. There are many things you can do to make your home safe and to help prevent falls. What can I do on the outside of my home? Regularly fix the edges of walkways and driveways and fix any cracks. Remove anything that might make you trip as you walk through a door, such as a raised step or threshold. Trim any bushes or trees on the path to your home. Use bright outdoor lighting. Clear any walking paths of anything that might make someone trip, such as rocks or tools. Regularly check to see if handrails are loose or broken. Make sure that both sides of any steps have handrails. Any raised decks and porches should have guardrails on the edges. Have any leaves, snow, or ice cleared regularly. Use sand or salt on walking paths during winter. Clean up any spills in your garage right away. This includes oil or grease spills. What can I do in the bathroom? Use night lights. Install grab bars by the toilet and in the tub and shower. Do not use towel bars as grab bars. Use non-skid mats or decals in the tub or shower. If you need to sit down in the shower, use a plastic, non-slip stool. Keep the floor dry. Clean up any water that spills on the floor as soon as it happens. Remove soap buildup in the tub or shower regularly. Attach bath mats securely with double-sided non-slip rug tape. Do not have throw rugs and other things on the floor that can make you trip. What can I do in the bedroom? Use night  lights. Make sure that you have a light by your bed that is easy to reach. Do not use any sheets or blankets that are too big for your bed. They should not hang down onto the floor. Have a firm chair that has side arms. You can use this for support while you get dressed. Do not have throw rugs and other things on the floor that can make you trip. What can I do in the kitchen? Clean up any spills right away. Avoid walking on wet floors. Keep items that you use a lot in easy-to-reach places. If you need to reach something above you, use a strong step stool that has a grab bar. Keep electrical cords out of the way. Do not use floor polish or wax that makes floors slippery. If you must use wax, use non-skid floor wax. Do not have throw rugs and other things on the floor that can make you trip. What can I do with my stairs? Do not leave any items on the stairs. Make sure that there are handrails on both sides of the stairs and use them. Fix handrails that are broken or loose. Make sure that handrails are as long as the stairways. Check any carpeting to make sure that it is firmly attached to the stairs. Fix any carpet that is loose or worn. Avoid having throw rugs at the top or  bottom of the stairs. If you do have throw rugs, attach them to the floor with carpet tape. Make sure that you have a light switch at the top of the stairs and the bottom of the stairs. If you do not have them, ask someone to add them for you. What else can I do to help prevent falls? Wear shoes that: Do not have high heels. Have rubber bottoms. Are comfortable and fit you well. Are closed at the toe. Do not wear sandals. If you use a stepladder: Make sure that it is fully opened. Do not climb a closed stepladder. Make sure that both sides of the stepladder are locked into place. Ask someone to hold it for you, if possible. Clearly mark and make sure that you can see: Any grab bars or handrails. First and last  steps. Where the edge of each step is. Use tools that help you move around (mobility aids) if they are needed. These include: Canes. Walkers. Scooters. Crutches. Turn on the lights when you go into a dark area. Replace any light bulbs as soon as they burn out. Set up your furniture so you have a clear path. Avoid moving your furniture around. If any of your floors are uneven, fix them. If there are any pets around you, be aware of where they are. Review your medicines with your doctor. Some medicines can make you feel dizzy. This can increase your chance of falling. Ask your doctor what other things that you can do to help prevent falls. This information is not intended to replace advice given to you by your health care provider. Make sure you discuss any questions you have with your health care provider. Document Released: 04/21/2009 Document Revised: 12/01/2015 Document Reviewed: 07/30/2014 Elsevier Interactive Patient Education  2017 [employer].

## 2022-06-25 DIAGNOSIS — Z961 Presence of intraocular lens: Secondary | ICD-10-CM | POA: Diagnosis not present

## 2022-06-25 DIAGNOSIS — E119 Type 2 diabetes mellitus without complications: Secondary | ICD-10-CM | POA: Diagnosis not present

## 2022-06-25 DIAGNOSIS — H4912 Fourth [trochlear] nerve palsy, left eye: Secondary | ICD-10-CM | POA: Diagnosis not present

## 2022-06-25 LAB — HM DIABETES EYE EXAM

## 2022-07-11 ENCOUNTER — Ambulatory Visit: Payer: Medicare Other | Admitting: Family Medicine

## 2022-07-29 ENCOUNTER — Other Ambulatory Visit: Payer: Self-pay | Admitting: Family Medicine

## 2022-07-31 NOTE — Telephone Encounter (Signed)
Requested Prescriptions  Pending Prescriptions Disp Refills   furosemide (LASIX) 40 MG tablet [Pharmacy Med Name: Furosemide 40 MG Oral Tablet] 90 tablet 0    Sig: TAKE 1 TABLET BY MOUTH DAILY     Cardiovascular:  Diuretics - Loop Failed - 07/29/2022 10:42 PM      Failed - Cr in normal range and within 180 days    Creat  Date Value Ref Range Status  05/13/2017 1.70 (H) 0.70 - 1.11 mg/dL Final    Comment:    For patients >86 years of age, the reference limit for Creatinine is approximately 13% higher for people identified as African-American. .    Creatinine, Ser  Date Value Ref Range Status  03/02/2022 1.85 (H) 0.76 - 1.27 mg/dL Final         Failed - Mg Level in normal range and within 180 days    No results found for: "MG"       Passed - K in normal range and within 180 days    Potassium  Date Value Ref Range Status  03/02/2022 4.5 3.5 - 5.2 mmol/L Final         Passed - Ca in normal range and within 180 days    Calcium  Date Value Ref Range Status  03/02/2022 9.6 8.6 - 10.2 mg/dL Final         Passed - Na in normal range and within 180 days    Sodium  Date Value Ref Range Status  03/02/2022 140 134 - 144 mmol/L Final         Passed - Cl in normal range and within 180 days    Chloride  Date Value Ref Range Status  03/02/2022 102 96 - 106 mmol/L Final         Passed - Last BP in normal range    BP Readings from Last 1 Encounters:  03/02/22 117/66         Passed - Valid encounter within last 6 months    Recent Outpatient Visits           5 months ago Diabetes mellitus with nephropathy ([place])   [place], Donald E, MD   9 months ago Diabetes mellitus with nephropathy [facility])   [place], Donald E, MD   1 year ago Need for influenza vaccination   [place], Donald E, MD   1 year ago Type 2 diabetes mellitus with chronic kidney disease, without  long-term current use of insulin, unspecified CKD stage [facility])   [place], Donald E, MD   1 year ago Diabetes mellitus with nephropathy [facility])   [place], Kirstie Peri, MD       Future Appointments             Tomorrow Caryn Section, Kirstie Peri, MD [place], [facility]

## 2022-08-01 ENCOUNTER — Ambulatory Visit (INDEPENDENT_AMBULATORY_CARE_PROVIDER_SITE_OTHER): Payer: Medicare Other | Admitting: Family Medicine

## 2022-08-01 ENCOUNTER — Encounter: Payer: Self-pay | Admitting: Family Medicine

## 2022-08-01 VITALS — BP 121/76 | HR 71 | Temp 97.9°F | Resp 16 | Wt 213.0 lb

## 2022-08-01 DIAGNOSIS — I2510 Atherosclerotic heart disease of native coronary artery without angina pectoris: Secondary | ICD-10-CM | POA: Diagnosis not present

## 2022-08-01 DIAGNOSIS — N1832 Chronic kidney disease, stage 3b: Secondary | ICD-10-CM

## 2022-08-01 DIAGNOSIS — M67431 Ganglion, right wrist: Secondary | ICD-10-CM

## 2022-08-01 DIAGNOSIS — I5032 Chronic diastolic (congestive) heart failure: Secondary | ICD-10-CM

## 2022-08-01 DIAGNOSIS — E1122 Type 2 diabetes mellitus with diabetic chronic kidney disease: Secondary | ICD-10-CM

## 2022-08-01 DIAGNOSIS — I10 Essential (primary) hypertension: Secondary | ICD-10-CM | POA: Diagnosis not present

## 2022-08-01 LAB — POCT GLYCOSYLATED HEMOGLOBIN (HGB A1C)
Est. average glucose Bld gHb Est-mCnc: 180
Hemoglobin A1C: 7.9 % — AB (ref 4.0–5.6)

## 2022-08-01 NOTE — Progress Notes (Signed)
I,Sulibeya S Dimas,acting as a Education administrator for [name], MD.,have documented all relevant documentation on the behalf of [name], MD,as directed by  [name], MD while in the presence of [name], MD.     Established patient visit   Patient: Mark Terry.   DOB: 1934-08-18   88 y.o. Male  MRN: [id number] Visit Date: 08/01/2022  Today's healthcare provider: [name], MD   Chief Complaint  Patient presents with   Diabetes   Hypertension   Hyperlipidemia   Cyst   Subjective    HPI  Diabetes Mellitus Type II, follow-up  Lab Results  Component Value Date   HGBA1C 7.9 (A) 08/01/2022   HGBA1C 7.3 (A) 03/02/2022   HGBA1C 7.1 (H) 10/25/2021   Last seen for diabetes 4 months ago.  Management since then includes continuing the same treatment. He reports excellent compliance with treatment. He is not having side effects.   Home blood sugar records: fasting range: 180s  Episodes of hypoglycemia? No    Current insulin regiment: none Most Recent [facility] Exam: [facility]  --------------------------------------------------------------------------------------------------- Hypertension, follow-up  BP Readings from Last 3 Encounters:  08/01/22 121/76  03/02/22 117/66  12/12/21 124/78   Wt Readings from Last 3 Encounters:  08/01/22 213 lb (96.6 kg)  06/20/22 207 lb (93.9 kg)  12/12/21 211 lb (95.7 kg)     He was last seen for hypertension 4 months ago.  BP at that visit was 117/66. Management since that visit includes no changes. He reports excellent compliance with treatment. He is not having side effects.   Outside blood pressures are stable.  --------------------------------------------------------------------------------------------------- Lipid/Cholesterol, follow-up  Last Lipid Panel: Lab Results  Component Value Date   CHOL 138 06/14/2021   LDLCALC 54 06/14/2021   HDL 45 06/14/2021   TRIG 249 (H) 06/14/2021     He was last seen for this 4 months ago.  Management since that visit includes no changes.  He reports excellent compliance with treatment. He is not having side effects.   Last metabolic panel Lab Results  Component Value Date   GLUCOSE 118 (H) 03/02/2022   NA 140 03/02/2022   K 4.5 03/02/2022   BUN 28 (H) 03/02/2022   CREATININE 1.85 (H) 03/02/2022   EGFR 35 (L) 03/02/2022   GFRNONAA 33 (L) 05/30/2020   CALCIUM 9.6 03/02/2022   AST 31 10/25/2021   ALT 26 10/25/2021   The ASCVD Risk score (Arnett DK, et al., 2019) failed to calculate for the following reasons:   The 2019 ASCVD risk score is only valid for ages 60 to 60  --------------------------------------------------------------------------------------------------- Patient C/O cyst on right wrist x a couple of months. Patient reports pain and feels hot some mornings.   Medications: Outpatient Medications Prior to Visit  Medication Sig   Accu-Chek FastClix Lancets MISC USE TO CHECK BLOOD SUGAR  DAILY   ACCU-CHEK GUIDE test strip CHECK BLOOD SUGAR ONCE  DAILY FOR TYPE 2 DIABETES   aspirin 81 MG tablet Take 81 mg by mouth daily.   Blood Glucose Monitoring Suppl (ACCU-CHEK GUIDE) w/Device KIT USE AS DIRECTED DAILY   doxazosin (CARDURA) 2 MG tablet TAKE 1 TABLET BY MOUTH  DAILY   fluticasone (FLONASE) 50 MCG/ACT nasal spray USE 1 TO 2 SPRAYS IN BOTH  NOSTRILS DAILY   furosemide (LASIX) 40 MG tablet TAKE 1 TABLET BY MOUTH DAILY   gabapentin (NEURONTIN) 300 MG capsule Take 1 capsule (300 mg total) by mouth 3 (three) times  daily.   glimepiride (AMARYL) 1 MG tablet TAKE 1 TABLET BY MOUTH  DAILY   HYDROcodone-acetaminophen (NORCO/VICODIN) 5-325 MG tablet TAKE 1 TABLET BY MOUTH TWICE DAILY AS NEEDED   ibuprofen (ADVIL,MOTRIN) 200 MG tablet Take 200 mg by mouth every 6 (six) hours as needed.   JARDIANCE 25 MG TABS tablet TAKE 1 TABLET BY MOUTH  DAILY   Lancets Misc. (ACCU-CHEK FASTCLIX LANCET) KIT Used to check glucose.  DX  E11.9   loratadine (CLARITIN) 10 MG tablet Take 10 mg by mouth daily.   losartan (COZAAR) 50 MG tablet TAKE 1 TABLET BY MOUTH  DAILY   meloxicam (MOBIC) 15 MG tablet Take 15 mg by mouth daily.   metFORMIN (GLUCOPHAGE-XR) 500 MG 24 hr tablet TAKE 1 TABLET BY MOUTH  TWICE DAILY   metoprolol tartrate (LOPRESSOR) 25 MG tablet TAKE 1 TABLET BY MOUTH TWICE  DAILY   Multiple Vitamin (MULTIVITAMIN WITH MINERALS) TABS Take 1 tablet by mouth daily.   omeprazole (PRILOSEC) 40 MG capsule TAKE 1 CAPSULE BY MOUTH  DAILY   oxybutynin (DITROPAN) 5 MG tablet TAKE 1 TABLET BY MOUTH  TWICE DAILY   potassium chloride SA (KLOR-CON M) 20 MEQ tablet Take 1 tablet (20 mEq total) by mouth daily.   Silver-Carboxymethylcellulose (AQUACEL-AG HYDROFIBER 3/4"X18") 3/4"X18" MISC Apply 1 Dose topically in the morning and at bedtime.   simvastatin (ZOCOR) 40 MG tablet TAKE 1 TABLET BY MOUTH AT  BEDTIME   traZODone (DESYREL) 100 MG tablet TAKE 1/2 TO 1 TABLET BY  MOUTH AT BEDTIME AS NEEDED  FOR SLEEP   Vitamin D, Ergocalciferol, (DRISDOL) 1.25 MG (50000 UNIT) CAPS capsule Take 50,000 Units by mouth every 7 (seven) days.   azelastine (ASTELIN) 0.1 % nasal spray Place 2 sprays into both nostrils 2 (two) times daily. Use in each nostril as directed (Patient not taking: Reported on 08/01/2022)   No facility-administered medications prior to visit.    Review of Systems  Constitutional:  Negative for appetite change.  Eyes:  Negative for visual disturbance.  Respiratory:  Positive for shortness of breath. Negative for chest tightness.   Cardiovascular:  Positive for leg swelling. Negative for chest pain.  Gastrointestinal:  Negative for abdominal pain, nausea and vomiting.  Neurological:  Negative for dizziness, light-headedness and headaches.       Objective    BP 121/76 (BP Location: Left Arm, Patient Position: Sitting, Cuff Size: Large)   Pulse 71   Temp 97.9 F (36.6 C) (Temporal)   Resp 16   Wt 213 lb (96.6 kg)  Comment: per patient  BMI 41.60 kg/m  BP Readings from Last 3 Encounters:  08/01/22 121/76  03/02/22 117/66  12/12/21 124/78   Wt Readings from Last 3 Encounters:  08/01/22 213 lb (96.6 kg)  06/20/22 207 lb (93.9 kg)  12/12/21 211 lb (95.7 kg)   Physical Exam    General: Appearance:    Severely obese male in no acute distress  Eyes:    PERRL, conjunctiva/corneas clear, EOM's intact. A bit of bruising noted around eyes which he states is due to a recent fall in which he foot got tangled up getting out of his car.   Lungs:     Clear to auscultation bilaterally, respirations unlabored  Heart:    Normal heart rate. Normal rhythm. No murmurs, rubs, or gallops.    MS:   All extremities are intact.  Pea sized ganglionic cyst noted dorsum of right wrist.   Neurologic:   Awake, alert, oriented x  3. No apparent focal neurological defect.         Results for orders placed or performed in visit on 08/01/22  POCT glycosylated hemoglobin (Hb A1C)  Result Value Ref Range   Hemoglobin A1C 7.9 (A) 4.0 - 5.6 %   Est. average glucose Bld gHb Est-mCnc 180     Assessment & Plan     1. Type 2 diabetes mellitus with chronic kidney disease, without long-term current use of insulin, unspecified CKD stage (HCC) A1c is up a bit. He thinks he can make some dietary improvement. Continue current medications.  Plan to recheck in 3-4 months.  - Lipid panel  2. Essential hypertension Well controlled.  Continue current medications.   - CBC  - Lipid panel - TSH  3. Ganglion cyst of dorsum of right wrist  - Ambulatory referral to Orthopedic Surgery    4. Coronary artery disease involving native coronary artery of native heart without angina pectoris Asymptomatic. Compliant with medication.  Continue aggressive risk factor modification.  Continue follow up cardiology as scheduled.   5. Chronic diastolic congestive heart failure (HCC) Asymptomatic. Compliant with medication.  Continue aggressive risk  factor modification.    6. Chronic kidney disease, stage 3b (HCC) - Comprehensive metabolic panel     The entirety of the information documented in the History of Present Illness, Review of Systems and Physical Exam were personally obtained by me. Portions of this information were initially documented by the CMA and reviewed by me for thoroughness and accuracy.     [name], MD  [place] [phone] (phone) [phone] (fax)  [place]

## 2022-08-02 LAB — CBC
Hematocrit: 34.2 % — ABNORMAL LOW (ref 37.5–51.0)
Hemoglobin: 11.2 g/dL — ABNORMAL LOW (ref 13.0–17.7)
MCH: 30.5 pg (ref 26.6–33.0)
MCHC: 32.7 g/dL (ref 31.5–35.7)
MCV: 93 fL (ref 79–97)
Platelets: 120 10*3/uL — ABNORMAL LOW (ref 150–450)
RBC: 3.67 x10E6/uL — ABNORMAL LOW (ref 4.14–5.80)
RDW: 14.5 % (ref 11.6–15.4)
WBC: 5.9 10*3/uL (ref 3.4–10.8)

## 2022-08-02 LAB — LIPID PANEL
Chol/HDL Ratio: 2.8 ratio (ref 0.0–5.0)
Cholesterol, Total: 128 mg/dL (ref 100–199)
HDL: 46 mg/dL (ref >39)
LDL Chol Calc (NIH): 47 mg/dL (ref 0–99)
Triglycerides: 220 mg/dL — ABNORMAL HIGH (ref 0–149)
VLDL Cholesterol Cal: 35 mg/dL (ref 5–40)

## 2022-08-02 LAB — COMPREHENSIVE METABOLIC PANEL
ALT: 32 IU/L (ref 0–44)
AST: 28 IU/L (ref 0–40)
Albumin/Globulin Ratio: 1.6 (ref 1.2–2.2)
Albumin: 4.2 g/dL (ref 3.7–4.7)
Alkaline Phosphatase: 69 IU/L (ref 44–121)
BUN/Creatinine Ratio: 20 (ref 10–24)
BUN: 33 mg/dL — ABNORMAL HIGH (ref 8–27)
Bilirubin Total: 0.4 mg/dL (ref 0.0–1.2)
CO2: 21 mmol/L (ref 20–29)
Calcium: 9.8 mg/dL (ref 8.6–10.2)
Chloride: 104 mmol/L (ref 96–106)
Creatinine, Ser: 1.68 mg/dL — ABNORMAL HIGH (ref 0.76–1.27)
Globulin, Total: 2.6 g/dL (ref 1.5–4.5)
Glucose: 160 mg/dL — ABNORMAL HIGH (ref 70–99)
Potassium: 4.7 mmol/L (ref 3.5–5.2)
Sodium: 142 mmol/L (ref 134–144)
Total Protein: 6.8 g/dL (ref 6.0–8.5)
eGFR: 40 mL/min/{1.73_m2} — ABNORMAL LOW (ref >59)

## 2022-08-02 LAB — TSH: TSH: 5.37 u[IU]/mL — ABNORMAL HIGH (ref 0.450–4.500)

## 2022-08-03 ENCOUNTER — Encounter: Payer: Self-pay | Admitting: Family Medicine

## 2022-08-05 ENCOUNTER — Other Ambulatory Visit: Payer: Self-pay | Admitting: Family Medicine

## 2022-08-06 DIAGNOSIS — M67431 Ganglion, right wrist: Secondary | ICD-10-CM | POA: Diagnosis not present

## 2022-08-06 DIAGNOSIS — M19031 Primary osteoarthritis, right wrist: Secondary | ICD-10-CM | POA: Diagnosis not present

## 2022-08-06 DIAGNOSIS — I700 Atherosclerosis of aorta: Secondary | ICD-10-CM | POA: Diagnosis not present

## 2022-08-06 NOTE — Telephone Encounter (Signed)
Requested medication (s) are due for refill today - expired Rx  Requested medication (s) are on the active medication list -yes  Future visit scheduled -yes  Last refill: 07/28/20 #90 1RF  Notes to clinic: expired Rx  Requested Prescriptions  Pending Prescriptions Disp Refills   traZODone (DESYREL) 100 MG tablet [Pharmacy Med Name: traZODone HCl 100 MG Oral Tablet] 90 tablet 3    Sig: TAKE 1/2 TO 1 TABLET BY  MOUTH AT BEDTIME AS NEEDED  FOR SLEEP     Psychiatry: Antidepressants - Serotonin Modulator Passed - 08/05/2022  5:47 AM      Passed - Valid encounter within last 6 months    Recent Outpatient Visits           5 days ago Type 2 diabetes mellitus with chronic kidney disease, without long-term current use of insulin, unspecified CKD stage ([place])   [place], Donald E, MD   5 months ago Diabetes mellitus with nephropathy [facility])   [place] Birdie Sons, MD   9 months ago Diabetes mellitus with nephropathy [facility])   [place], Donald E, MD   1 year ago Need for influenza vaccination   [place], Donald E, MD   1 year ago Type 2 diabetes mellitus with chronic kidney disease, without long-term current use of insulin, unspecified CKD stage ([place])   [place], Kirstie Peri, MD       Future Appointments             In 3 months [place], Kirstie Peri, MD [place], [place]               Requested Prescriptions  Pending Prescriptions Disp Refills   traZODone (DESYREL) 100 MG tablet [Pharmacy Med Name: traZODone HCl 100 MG Oral Tablet] 90 tablet 3    Sig: TAKE 1/2 TO 1 TABLET BY  MOUTH AT BEDTIME AS NEEDED  FOR SLEEP     Psychiatry: Antidepressants - Serotonin Modulator Passed - 08/05/2022  5:47 AM      Passed - Valid encounter within last 6 months    Recent Outpatient Visits            5 days ago Type 2 diabetes mellitus with chronic kidney disease, without long-term current use of insulin, unspecified CKD stage ([place])   [place], Donald E, MD   5 months ago Diabetes mellitus with nephropathy [facility])   [place] Birdie Sons, MD   9 months ago Diabetes mellitus with nephropathy [facility])   [place], Donald E, MD   1 year ago Need for influenza vaccination   [place], Donald E, MD   1 year ago Type 2 diabetes mellitus with chronic kidney disease, without long-term current use of insulin, unspecified CKD stage [facility])   [place], Donald E, MD       Future Appointments             In 3 months [place], Kirstie Peri, MD [facility], [facility]

## 2022-08-07 ENCOUNTER — Other Ambulatory Visit: Payer: Self-pay | Admitting: Family Medicine

## 2022-08-07 DIAGNOSIS — E039 Hypothyroidism, unspecified: Secondary | ICD-10-CM

## 2022-08-07 MED ORDER — LEVOTHYROXINE SODIUM 25 MCG PO TABS: 25.0000 ug | ORAL_TABLET | Freq: Every day | ORAL | 1 refills | Status: DC

## 2022-08-14 ENCOUNTER — Other Ambulatory Visit: Payer: Self-pay | Admitting: Family Medicine

## 2022-08-14 DIAGNOSIS — E1122 Type 2 diabetes mellitus with diabetic chronic kidney disease: Secondary | ICD-10-CM

## 2022-09-03 DIAGNOSIS — M5126 Other intervertebral disc displacement, lumbar region: Secondary | ICD-10-CM | POA: Diagnosis not present

## 2022-09-03 DIAGNOSIS — M48062 Spinal stenosis, lumbar region with neurogenic claudication: Secondary | ICD-10-CM | POA: Diagnosis not present

## 2022-09-03 DIAGNOSIS — M1612 Unilateral primary osteoarthritis, left hip: Secondary | ICD-10-CM | POA: Diagnosis not present

## 2022-09-03 DIAGNOSIS — M5416 Radiculopathy, lumbar region: Secondary | ICD-10-CM | POA: Diagnosis not present

## 2022-09-03 DIAGNOSIS — M5030 Other cervical disc degeneration, unspecified cervical region: Secondary | ICD-10-CM | POA: Diagnosis not present

## 2022-09-03 DIAGNOSIS — M5136 Other intervertebral disc degeneration, lumbar region: Secondary | ICD-10-CM | POA: Diagnosis not present

## 2022-09-03 DIAGNOSIS — M5412 Radiculopathy, cervical region: Secondary | ICD-10-CM | POA: Diagnosis not present

## 2022-09-04 DIAGNOSIS — M1612 Unilateral primary osteoarthritis, left hip: Secondary | ICD-10-CM | POA: Diagnosis not present

## 2022-09-05 DIAGNOSIS — E119 Type 2 diabetes mellitus without complications: Secondary | ICD-10-CM | POA: Diagnosis not present

## 2022-09-05 DIAGNOSIS — B351 Tinea unguium: Secondary | ICD-10-CM | POA: Diagnosis not present

## 2022-09-05 DIAGNOSIS — L851 Acquired keratosis [keratoderma] palmaris et plantaris: Secondary | ICD-10-CM | POA: Diagnosis not present

## 2022-09-05 DIAGNOSIS — L600 Ingrowing nail: Secondary | ICD-10-CM | POA: Diagnosis not present

## 2022-09-24 DIAGNOSIS — M4802 Spinal stenosis, cervical region: Secondary | ICD-10-CM | POA: Diagnosis not present

## 2022-09-24 DIAGNOSIS — M5412 Radiculopathy, cervical region: Secondary | ICD-10-CM | POA: Diagnosis not present

## 2022-09-25 ENCOUNTER — Other Ambulatory Visit: Payer: Self-pay | Admitting: Family Medicine

## 2022-09-30 ENCOUNTER — Other Ambulatory Visit: Payer: Self-pay | Admitting: Family Medicine

## 2022-10-01 DIAGNOSIS — M67431 Ganglion, right wrist: Secondary | ICD-10-CM | POA: Diagnosis not present

## 2022-10-01 DIAGNOSIS — I700 Atherosclerosis of aorta: Secondary | ICD-10-CM | POA: Diagnosis not present

## 2022-10-02 NOTE — Telephone Encounter (Signed)
Requested Prescriptions  Pending Prescriptions Disp Refills   furosemide (LASIX) 40 MG tablet [Pharmacy Med Name: Furosemide 40 MG Oral Tablet] 90 tablet 0    Sig: TAKE 1 TABLET BY MOUTH DAILY     Cardiovascular:  Diuretics - Loop Failed - 09/30/2022 10:06 PM      Failed - Cr in normal range and within 180 days    Creat  Date Value Ref Range Status  05/13/2017 1.70 (H) 0.70 - 1.11 mg/dL Final    Comment:    For patients >86 years of age, the reference limit for Creatinine is approximately 13% higher for people identified as African-American. .    Creatinine, Ser  Date Value Ref Range Status  08/01/2022 1.68 (H) 0.76 - 1.27 mg/dL Final         Failed - Mg Level in normal range and within 180 days    No results found for: "MG"       Passed - K in normal range and within 180 days    Potassium  Date Value Ref Range Status  08/01/2022 4.7 3.5 - 5.2 mmol/L Final         Passed - Ca in normal range and within 180 days    Calcium  Date Value Ref Range Status  08/01/2022 9.8 8.6 - 10.2 mg/dL Final         Passed - Na in normal range and within 180 days    Sodium  Date Value Ref Range Status  08/01/2022 142 134 - 144 mmol/L Final         Passed - Cl in normal range and within 180 days    Chloride  Date Value Ref Range Status  08/01/2022 104 96 - 106 mmol/L Final         Passed - Last BP in normal range    BP Readings from Last 1 Encounters:  08/01/22 121/76         Passed - Valid encounter within last 6 months    Recent Outpatient Visits           2 months ago Type 2 diabetes mellitus with chronic kidney disease, without long-term current use of insulin, unspecified CKD stage ([place])   [place], Donald E, MD   7 months ago Diabetes mellitus with nephropathy [facility])   [place] Birdie Sons, MD   11 months ago Diabetes mellitus with nephropathy [facility])   [place],  Donald E, MD   1 year ago Need for influenza vaccination   [place], Donald E, MD   1 year ago Type 2 diabetes mellitus with chronic kidney disease, without long-term current use of insulin, unspecified CKD stage [facility])   [place], Donald E, MD       Future Appointments             In 1 month [place], Kirstie Peri, MD [facility], [facility]

## 2022-10-22 DIAGNOSIS — M67431 Ganglion, right wrist: Secondary | ICD-10-CM | POA: Diagnosis not present

## 2022-10-22 HISTORY — PX: GANGLION CYST EXCISION: SHX1691

## 2022-10-23 ENCOUNTER — Telehealth: Payer: Self-pay

## 2022-10-23 DIAGNOSIS — T148XXA Other injury of unspecified body region, initial encounter: Secondary | ICD-10-CM

## 2022-10-23 DIAGNOSIS — I5032 Chronic diastolic (congestive) heart failure: Secondary | ICD-10-CM

## 2022-10-23 NOTE — Telephone Encounter (Signed)
Copied from CRM [id number]. Topic: General - Other >> Oct 23, 2022 11:48 AM Franchot Heidelberg wrote: Reason for CRM: Pt's daughter called requesting for the pt to have home health services. She says he needs help bathing, dressing, possibly some wound care because he just had surgery and has two more weeks until suture removal.   Best contact: [phone]

## 2022-10-24 DIAGNOSIS — M67431 Ganglion, right wrist: Secondary | ICD-10-CM | POA: Diagnosis not present

## 2022-10-24 NOTE — Telephone Encounter (Signed)
HH order placed 

## 2022-10-24 NOTE — Addendum Note (Signed)
Addended by: Mila Merry E on: 10/24/2022 01:06 PM   Modules accepted: Orders

## 2022-10-25 NOTE — Telephone Encounter (Signed)
Pt will need an office visit for documentation in order for insurance to cover.  Home health will not be able to go out unless wound is open and weeping

## 2022-10-25 NOTE — Telephone Encounter (Signed)
Appt scheduled for 11-06-22

## 2022-10-25 NOTE — Telephone Encounter (Signed)
Please advise patient and his daughter who requested referral

## 2022-10-30 ENCOUNTER — Other Ambulatory Visit (INDEPENDENT_AMBULATORY_CARE_PROVIDER_SITE_OTHER): Payer: Self-pay | Admitting: Nurse Practitioner

## 2022-10-30 DIAGNOSIS — S91301A Unspecified open wound, right foot, initial encounter: Secondary | ICD-10-CM

## 2022-10-31 ENCOUNTER — Ambulatory Visit (INDEPENDENT_AMBULATORY_CARE_PROVIDER_SITE_OTHER): Payer: Medicare Other

## 2022-10-31 ENCOUNTER — Ambulatory Visit (INDEPENDENT_AMBULATORY_CARE_PROVIDER_SITE_OTHER): Payer: Medicare Other | Admitting: Nurse Practitioner

## 2022-10-31 VITALS — BP 108/71 | HR 70 | Resp 18 | Ht 61.0 in | Wt 212.0 lb

## 2022-10-31 DIAGNOSIS — S91309A Unspecified open wound, unspecified foot, initial encounter: Secondary | ICD-10-CM

## 2022-10-31 DIAGNOSIS — I10 Essential (primary) hypertension: Secondary | ICD-10-CM | POA: Diagnosis not present

## 2022-10-31 DIAGNOSIS — E1121 Type 2 diabetes mellitus with diabetic nephropathy: Secondary | ICD-10-CM | POA: Diagnosis not present

## 2022-10-31 DIAGNOSIS — S91301A Unspecified open wound, right foot, initial encounter: Secondary | ICD-10-CM | POA: Diagnosis not present

## 2022-11-05 LAB — VAS US ABI WITH/WO TBI
Left ABI: 1.31
Right ABI: >1

## 2022-11-05 NOTE — Progress Notes (Unsigned)
Vivien Rota DeSanto,acting as a scribe for Mila Merry, MD.,have documented all relevant documentation on the behalf of Mila Merry, MD,as directed by  Mila Merry, MD while in the presence of Mila Merry, MD.     Established patient visit   Patient: Mark Terry.   DOB: January 11, 1935   88 y.o. Male  MRN: [id number] Visit Date: 11/06/2022  Today's healthcare provider: Mila Merry, MD   No chief complaint on file.  Subjective    HPI  Patient is an 88 year old male who presents for follow up of chronic health and face to face for home healthcare referral.   Diabetes Mellitus Type II, Follow-up  Lab Results  Component Value Date   HGBA1C 7.9 (A) 08/01/2022   HGBA1C 7.3 (A) 03/02/2022   HGBA1C 7.1 (H) 10/25/2021   Wt Readings from Last 3 Encounters:  10/31/22 212 lb (96.2 kg)  08/01/22 213 lb (96.6 kg)  06/20/22 207 lb (93.9 kg)   Last seen for diabetes 3 months ago.  Management since then includes none. He reports {excellent/good/fair/poor:19665} compliance with treatment. He {is/is not:21021397} having side effects. {document side effects if present:1} Symptoms: {Yes/No:20286} fatigue {Yes/No:20286} foot ulcerations  {Yes/No:20286} appetite changes {Yes/No:20286} nausea  {Yes/No:20286} paresthesia of the feet  {Yes/No:20286} polydipsia  {Yes/No:20286} polyuria {Yes/No:20286} visual disturbances   {Yes/No:20286} vomiting     Home blood sugar records: {diabetes glucometry results:16657}  Episodes of hypoglycemia? {Yes/No:20286} {enter symptoms and frequency of symptoms if yes:1}   Current insulin regiment: {enter 'none' or type of insulin and number of units taken with each dose of each insulin formulation that the patient is taking:1} Most Recent Eye Exam: *** {Current exercise:16438:::1} {Current diet habits:16563:::1}  Pertinent Labs: Lab Results  Component Value Date   CHOL 128 08/01/2022   HDL 46 08/01/2022   LDLCALC 47 08/01/2022   TRIG 220  (H) 08/01/2022   CHOLHDL 2.8 08/01/2022   Lab Results  Component Value Date   NA 142 08/01/2022   K 4.7 08/01/2022   CREATININE 1.68 (H) 08/01/2022   EGFR 40 (L) 08/01/2022   LABMICR 21.8 10/25/2021   MICRALBCREAT 33 (H) 10/25/2021     ---------------------------------------------------------------------------------------------------    Medications: Outpatient Medications Prior to Visit  Medication Sig   Accu-Chek FastClix Lancets MISC USE TO CHECK BLOOD SUGAR  DAILY   ACCU-CHEK GUIDE test strip CHECK BLOOD SUGAR ONCE  DAILY FOR TYPE 2 DIABETES   aspirin 81 MG tablet Take 81 mg by mouth daily.   Blood Glucose Monitoring Suppl (ACCU-CHEK GUIDE) w/Device KIT USE AS DIRECTED DAILY   doxazosin (CARDURA) 2 MG tablet TAKE 1 TABLET BY MOUTH  DAILY   fluticasone (FLONASE) 50 MCG/ACT nasal spray USE 1 TO 2 SPRAYS IN BOTH  NOSTRILS DAILY   furosemide (LASIX) 40 MG tablet TAKE 1 TABLET BY MOUTH DAILY   gabapentin (NEURONTIN) 300 MG capsule Take 1 capsule (300 mg total) by mouth 3 (three) times daily.   glimepiride (AMARYL) 1 MG tablet TAKE 1 TABLET BY MOUTH  DAILY   HYDROcodone-acetaminophen (NORCO/VICODIN) 5-325 MG tablet TAKE 1 TABLET BY MOUTH TWICE DAILY AS NEEDED   ibuprofen (ADVIL,MOTRIN) 200 MG tablet Take 200 mg by mouth every 6 (six) hours as needed.   JARDIANCE 25 MG TABS tablet TAKE 1 TABLET BY MOUTH  DAILY   Lancets Misc. (ACCU-CHEK FASTCLIX LANCET) KIT Used to check glucose.  DX E11.9   levothyroxine (SYNTHROID) 25 MCG tablet Take 1 tablet (25 mcg total) by mouth daily.  loratadine (CLARITIN) 10 MG tablet Take 10 mg by mouth daily.   losartan (COZAAR) 50 MG tablet TAKE 1 TABLET BY MOUTH DAILY   meloxicam (MOBIC) 15 MG tablet Take 15 mg by mouth daily.   metFORMIN (GLUCOPHAGE-XR) 500 MG 24 hr tablet TAKE 1 TABLET BY MOUTH TWICE  DAILY   metoprolol tartrate (LOPRESSOR) 25 MG tablet TAKE 1 TABLET BY MOUTH TWICE  DAILY   Multiple Vitamin (MULTIVITAMIN WITH MINERALS) TABS Take 1  tablet by mouth daily.   omeprazole (PRILOSEC) 40 MG capsule TAKE 1 CAPSULE BY MOUTH  DAILY   oxybutynin (DITROPAN) 5 MG tablet TAKE 1 TABLET BY MOUTH  TWICE DAILY   potassium chloride SA (KLOR-CON M) 20 MEQ tablet Take 1 tablet (20 mEq total) by mouth daily.   Silver-Carboxymethylcellulose (AQUACEL-AG HYDROFIBER 3/4"X18") 3/4"X18" MISC Apply 1 Dose topically in the morning and at bedtime.   simvastatin (ZOCOR) 40 MG tablet TAKE 1 TABLET BY MOUTH AT  BEDTIME   traZODone (DESYREL) 100 MG tablet TAKE 1/2 TO 1 TABLET BY MOUTH AT BEDTIME AS NEEDED FOR SLEEP   Vitamin D, Ergocalciferol, (DRISDOL) 1.25 MG (50000 UNIT) CAPS capsule Take 50,000 Units by mouth every 7 (seven) days.   No facility-administered medications prior to visit.    Review of Systems  {Labs  Heme  Chem  Endocrine  Serology  Results Review (optional):23779}   Objective    There were no vitals taken for this visit. {Show previous vital signs (optional):23777}  Physical Exam  ***  No results found for any visits on 11/06/22.  Assessment & Plan     ***  No follow-ups on file.      {provider attestation***:1}   Mila Merry, MD  [facility] [phone] (phone) [phone] (fax)  [facility]

## 2022-11-06 ENCOUNTER — Encounter: Payer: Self-pay | Admitting: Family Medicine

## 2022-11-06 ENCOUNTER — Ambulatory Visit (INDEPENDENT_AMBULATORY_CARE_PROVIDER_SITE_OTHER): Payer: Medicare Other | Admitting: Family Medicine

## 2022-11-06 VITALS — BP 124/59 | HR 77 | Temp 98.4°F | Wt 212.0 lb

## 2022-11-06 DIAGNOSIS — I5032 Chronic diastolic (congestive) heart failure: Secondary | ICD-10-CM | POA: Diagnosis not present

## 2022-11-06 DIAGNOSIS — E1122 Type 2 diabetes mellitus with diabetic chronic kidney disease: Secondary | ICD-10-CM | POA: Diagnosis not present

## 2022-11-06 DIAGNOSIS — M5416 Radiculopathy, lumbar region: Secondary | ICD-10-CM

## 2022-11-06 DIAGNOSIS — Z7409 Other reduced mobility: Secondary | ICD-10-CM | POA: Diagnosis not present

## 2022-11-06 DIAGNOSIS — L03317 Cellulitis of buttock: Secondary | ICD-10-CM | POA: Diagnosis not present

## 2022-11-06 LAB — POCT GLYCOSYLATED HEMOGLOBIN (HGB A1C)
Est. average glucose Bld gHb Est-mCnc: 189
Hemoglobin A1C: 8.2 % — AB (ref 4.0–5.6)

## 2022-11-06 MED ORDER — CEPHALEXIN 500 MG PO CAPS: 500.0000 mg | ORAL_CAPSULE | Freq: Four times a day (QID) | ORAL | 0 refills | Status: DC

## 2022-11-06 NOTE — Patient Instructions (Signed)
.   Please review the attached list of medications and notify my office if there are any errors.   . Please bring all of your medications to every appointment so we can make sure that our medication list is the same as yours.   

## 2022-11-07 ENCOUNTER — Telehealth: Payer: Self-pay | Admitting: *Deleted

## 2022-11-07 NOTE — Progress Notes (Signed)
  Care Coordination   Note   11/07/2022 Name: Mark Terry. MRN: [id number] DOB: 1935-04-13  Mark Terry. is a 88 y.o. year old male who sees Fisher, Demetrios Isaacs, MD for primary care. I reached out to Dennison Bulla. by phone today to offer care coordination services.  [name] was given information about Care Coordination services today including:   The Care Coordination services include support from the care team which includes your Nurse Coordinator, Clinical Social Worker, or Pharmacist.  The Care Coordination team is here to help remove barriers to the health concerns and goals most important to you. Care Coordination services are voluntary, and the patient may decline or stop services at any time by request to their care team member.   Care Coordination Consent Status: Patient agreed to services and verbal consent obtained.   Follow up plan:  Telephone appointment with care coordination team member scheduled for:  11/08/2022  Encounter Outcome:  Pt. Scheduled from referral   Burman Nieves, [facility] Care Coordination Care Guide Direct Dial: [phone]

## 2022-11-08 ENCOUNTER — Ambulatory Visit: Payer: Self-pay

## 2022-11-08 DIAGNOSIS — M4802 Spinal stenosis, cervical region: Secondary | ICD-10-CM | POA: Diagnosis not present

## 2022-11-08 DIAGNOSIS — M5412 Radiculopathy, cervical region: Secondary | ICD-10-CM | POA: Diagnosis not present

## 2022-11-08 DIAGNOSIS — M5030 Other cervical disc degeneration, unspecified cervical region: Secondary | ICD-10-CM | POA: Diagnosis not present

## 2022-11-08 DIAGNOSIS — Z79899 Other long term (current) drug therapy: Secondary | ICD-10-CM | POA: Diagnosis not present

## 2022-11-08 DIAGNOSIS — M5126 Other intervertebral disc displacement, lumbar region: Secondary | ICD-10-CM | POA: Diagnosis not present

## 2022-11-08 DIAGNOSIS — M48062 Spinal stenosis, lumbar region with neurogenic claudication: Secondary | ICD-10-CM | POA: Diagnosis not present

## 2022-11-08 DIAGNOSIS — M5416 Radiculopathy, lumbar region: Secondary | ICD-10-CM | POA: Diagnosis not present

## 2022-11-08 NOTE — Patient Instructions (Signed)
Visit Information  Thank you for taking time to visit with me today. Please don't hesitate to contact me if I can be of assistance to you.   Following are the goals we discussed today:   Goals Addressed               This Visit's Progress     Improve bathing (pt-stated)        -Patient wants to improve bathing habits by obtaining a personal care assistant -Patients daughter will contact personal care provider list on cost        Our next appointment is by telephone on 11/13/22 at 11am  Please call the care guide team at [phone] if you need to cancel or reschedule your appointment.   If you are experiencing a Mental Health or Behavioral Health Crisis or need someone to talk to, please call 911  Patient verbalizes understanding of instructions and care plan provided today and agrees to view in MyChart. Active MyChart status and patient understanding of how to access instructions and care plan via MyChart confirmed with patient.     Telephone follow up appointment with care management team member scheduled for:11/13/22 at 11am.  Lysle Morales, BSW Social Worker [facility] Care Management  [phone]

## 2022-11-08 NOTE — Patient Outreach (Signed)
  Care Coordination   Initial Visit Note   11/08/2022 Name: Sara Selvidge. MRN: [id number] DOB: [birth year]  Belia Heman Rc Amison. is a 88 y.o. year old male who sees Fisher, Demetrios Isaacs, MD for primary care. I spoke with  Dennison Bulla. Daughter Melina Copa by phone today.  What matters to the patients health and wellness today?  Patient is able to perform his ADL's with the assistance from family and paid person for cleaning/errands/transportation.  He is able to bathe himself but wants additional support performing the activity. [facility] has been provided a list and will follow up on the cost.    Goals Addressed               This Visit's Progress     Improve bathing (pt-stated)        -Patient wants to improve bathing habits by obtaining a personal care assistant -Patients daughter will contact personal care provider list on cost        SDOH assessments and interventions completed:  Yes  SDOH Interventions Today    Flowsheet Row Most Recent Value  SDOH Interventions   Food Insecurity Interventions Intervention Not Indicated  Housing Interventions Intervention Not Indicated  Transportation Interventions Intervention Not Indicated  Utilities Interventions Intervention Not Indicated  Financial Strain Interventions Intervention Not Indicated        Care Coordination Interventions:  Yes, provided  -CM will contact [facility] to follow up on personal care program.  Follow up plan: Follow up call scheduled for 11/13/22 at 11am    Encounter Outcome:  Pt. Visit Completed   Lysle Morales, BSW Social Worker [facility] Care Management  [phone]

## 2022-11-09 ENCOUNTER — Telehealth: Payer: Self-pay | Admitting: Family Medicine

## 2022-11-12 ENCOUNTER — Ambulatory Visit: Payer: Medicare Other | Admitting: Family Medicine

## 2022-11-12 NOTE — Telephone Encounter (Signed)
Home Health Care agency would like to know if pt has an open wound. If not insurance will not cover this If pt would like personal care services such as help with bathing this is different than home health care and is also not covered by his insurance

## 2022-11-13 ENCOUNTER — Ambulatory Visit: Payer: Self-pay

## 2022-11-13 NOTE — Patient Outreach (Addendum)
  Care Coordination   Follow Up Visit Note   11/13/2022 Name: Johanthan Broad. MRN: [id number] DOB: [birth year]  Belia Heman Keifer Hauter. is a 88 y.o. year old male who sees Fisher, Demetrios Isaacs, MD for primary care. I spoke with  Dennison Bulla. Daughter Melina Copa by phone today.  What matters to the patients health and wellness today?  Patients daughter agrees to discuss Physicist, medical for 2 hrs of personal care daily to assist with ADL's.  Several other agencies were too expensive.  Patients daughter will also contact [facility] to be added to the waiting list for their personal care program. Misty Stanley no longer needs services and request no follow up.   Goals Addressed               This Visit's Progress     COMPLETED: Improve bathing (pt-stated)        -Patient wants to improve bathing habits by obtaining a personal care assistant -Patients daughter will contact personal care provider list on cost   -Patients daughter has located a provider for personal care and will discuss with patient -Patient continues to bathe himself -Patients daughter will add to waiting list at [facility] for personal care program        SDOH assessments and interventions completed:  Yes     Care Coordination Interventions:  No, not indicated   Follow up plan: No further intervention required.   Encounter Outcome:  Pt. Visit Completed

## 2022-11-13 NOTE — Patient Instructions (Signed)
Visit Information  Thank you for taking time to visit with me today. Please don't hesitate to contact me if I can be of assistance to you.   Following are the goals we discussed today:   Goals Addressed   None      If you are experiencing a Mental Health or Behavioral Health Crisis or need someone to talk to, please call 911  Patient verbalizes understanding of instructions and care plan provided today and agrees to view in MyChart. Active MyChart status and patient understanding of how to access instructions and care plan via MyChart confirmed with patient.     No further follow up required:      Beckam Abdulaziz, BSW Social Worker [facility] Care Management  [phone]  

## 2022-11-19 ENCOUNTER — Encounter (INDEPENDENT_AMBULATORY_CARE_PROVIDER_SITE_OTHER): Payer: Self-pay | Admitting: Nurse Practitioner

## 2022-11-19 NOTE — Progress Notes (Signed)
Subjective:    Patient ID: Mark Terry., male    DOB: 1935-06-01, 88 y.o.   MRN: [id number] Chief Complaint  Patient presents with   New Patient (Initial Visit)    np. ABI + consult. evaluate circulation + hyperkeratotic lesion of right heel. referred by cline, todd.      The patient is seen for evaluation of a heel lesion on the right heel by Dr. Alberteen Spindle.  There is a hyperkeratotic lesion.  Currently the patient denies any infection or pain of the wound.  Patient denies any claudication.  The patient denies rest pain or dangling of an extremity off the side of the bed during the night for relief. No prior interventions or surgeries.  Patient has a history of lumbar radiculopathy  The patient denies amaurosis fugax or recent TIA symptoms. There are no recent neurological changes noted. The patient denies history of DVT, PE or superficial thrombophlebitis. The patient denies recent episodes of angina or shortness of breath.   Today the patient has noncompressible ABIs bilaterally with a TBI 0.93 on the right and 0.91 on the left.  Good triphasic tibial artery waveforms bilaterally with normal toe waveforms bilaterally.    Review of Systems  Skin:  Positive for wound.  All other systems reviewed and are negative.      Objective:   Physical Exam Vitals reviewed.  HENT:     Head: Normocephalic.  Cardiovascular:     Rate and Rhythm: Normal rate.     Pulses:          Dorsalis pedis pulses are detected w/ Doppler on the right side and detected w/ Doppler on the left side.       Posterior tibial pulses are detected w/ Doppler on the right side and detected w/ Doppler on the left side.  Pulmonary:     Effort: Pulmonary effort is normal.  Skin:    General: Skin is warm and dry.  Neurological:     Mental Status: He is alert and oriented to person, place, and time.  Psychiatric:        Mood and Affect: Mood normal.        Behavior: Behavior normal.        Thought  Content: Thought content normal.        Judgment: Judgment normal.     BP 108/71 (BP Location: Left Arm)   Pulse 70   Resp 18   Ht 5\' 1"  (1.549 m)   Wt 212 lb (96.2 kg)   BMI 40.06 kg/m   Past Medical History:  Diagnosis Date   Anemia    Bruises easily    Diabetes mellitus without complication (HCC)    H/O esophagogastroduodenoscopy    H/O hiatal hernia    History of blood transfusion    no reaction noted   History of bronchitis    last time a couple of yrs ago   History of gastric ulcer    History of MRSA infection    Hyperlipidemia    Hypertension    Leg cramps    takes quinine prn   Melanoma (HCC)    Panic attacks    Pneumonia    hx of last time a couple of yrs ago   PONV (postoperative nausea and vomiting)    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   Diagnosis: Squamous cell Carcinoma Location: Mid superior forehead Pre- operative size: 1cm x 1cm Total stages: 1 Post-Operative Size: 2.3cm x 2.1cm Date of  Surgery: 05/01/2019    Social History   Socioeconomic History   Marital status: Divorced    Spouse name: Not on file   Number of children: 2   Years of education: 12   Highest education level: 12th grade  Occupational History   Occupation: Retired  Tobacco Use   Smoking status: Former    Packs/day: 1.00    Years: 25.00    Additional pack years: 0.00    Total pack years: 25.00    Types: Cigarettes    Quit date: 07/09/1968    Years since quitting: 54.4    Passive exposure: Past   Smokeless tobacco: Never   Tobacco comments:    quit at age 62  Vaping Use   Vaping Use: Never used  Substance and Sexual Activity   Alcohol use: No    Alcohol/week: 0.0 standard drinks of alcohol   Drug use: No   Sexual activity: Never  Other Topics Concern   Not on file  Social History Narrative   Not on file   Social Determinants of Health   Financial Resource Strain: Low Risk  (11/08/2022)   Overall Financial Resource Strain (CARDIA)    Difficulty of  Paying Living Expenses: Not very hard  Food Insecurity: No Food Insecurity (11/08/2022)   Hunger Vital Sign    Worried About Running Out of Food in the Last Year: Never true    Ran Out of Food in the Last Year: Never true  Transportation Needs: No Transportation Needs (11/08/2022)   PRAPARE - Administrator, Civil Service (Medical): No    Lack of Transportation (Non-Medical): No  Physical Activity: Sufficiently Active (06/20/2022)   Exercise Vital Sign    Days of Exercise per Week: 5 days    Minutes of Exercise per Session: 30 min  Stress: No Stress Concern Present (06/20/2022)   [employer] of Occupational Health - Occupational Stress Questionnaire    Feeling of Stress : Not at all  Social Connections: Moderately Isolated (06/20/2022)   Social Connection and Isolation Panel [NHANES]    Frequency of Communication with Friends and Family: More than three times a week    Frequency of Social Gatherings with Friends and Family: More than three times a week    Attends Religious Services: More than 4 times per year    Active Member of [employer] or Organizations: No    Attends Banker Meetings: Never    Marital Status: Divorced  Catering manager Violence: Not At Risk (06/20/2022)   Humiliation, Afraid, Rape, and Kick questionnaire    Fear of Current or Ex-Partner: No    Emotionally Abused: No    Physically Abused: No    Sexually Abused: No    Past Surgical History:  Procedure Laterality Date   ANKLE FUSION Right 11/04/2015   Procedure: ARTHRODESIS ANKLE;  Surgeon: Gwyneth Revels, DPM;  Location: [facility] ORS;  Service: Podiatry;  Laterality: Right;   BACK SURGERY     x 4, last lumb. laminectomy Dr. Jordan Likes 2008 cervical fusion x 2   bilateral cataract surgery     CHOLECYSTECTOMY  1970   COLONOSCOPY     CORONARY ANGIOPLASTY     CORONARY ARTERY BYPASS GRAFT  2000    3 vessels   ESOPHAGOGASTRODUODENOSCOPY (EGD) WITH PROPOFOL N/A 11/18/2017   Procedure:  ESOPHAGOGASTRODUODENOSCOPY (EGD) WITH PROPOFOL;  Surgeon: Wyline Mood, MD;  Location: [facility] ENDOSCOPY;  Service: Gastroenterology;  Laterality: N/A;   GANGLION CYST EXCISION Right 10/22/2022   Dr. Rosita Kea  JOINT REPLACEMENT     MOHS SURGERY Left 12/30/2018   [facility], Ella Bodo, MD   NECK SURGERY     x 2   prostate laser surgery     x 2   REPLACEMENT TOTAL KNEE BILATERAL  '86 and 2000   REVERSE SHOULDER ARTHROPLASTY  05/30/2012   Procedure: REVERSE SHOULDER ARTHROPLASTY;  Surgeon: Verlee Rossetti, MD;  Location: [facility];  Service: Orthopedics;  Laterality: Right;  right reverse shoulder arthroplasty   SQUAMOUS CELL CARCINOMA EXCISION  05/01/2019   mid superior forehead/ [facility]/ Dr. Christiane Ha L. Cook   TOTAL HIP ARTHROPLASTY Right 2012   [facility]    Family History  Problem Relation Age of Onset   Alzheimer's disease Mother    Heart attack Father    Bipolar disorder Brother    Kidney disease Neg Hx    Prostate cancer Neg Hx     Allergies  Allergen Reactions   Propoxyphene Anaphylaxis   Codeine Sulfate     Patient denies   Darvon [Propoxyphene Hcl] Other (See Comments)    Short of breath   Demerol [Meperidine] Other (See Comments)    Short of breath   Oxycodone Nausea Only       Latest Ref Rng & Units 08/01/2022    1:52 PM 06/14/2021   10:43 AM 09/27/2020    8:47 AM  CBC  WBC 3.4 - 10.8 x10E3/uL 5.9  8.5  6.4   Hemoglobin 13.0 - 17.7 g/dL 16.1  09.6  04.5   Hematocrit 37.5 - 51.0 % 34.2  37.8  35.7   Platelets 150 - 450 x10E3/uL 120  163  107       CMP     Component Value Date/Time   NA 142 08/01/2022 1352   K 4.7 08/01/2022 1352   CL 104 08/01/2022 1352   CO2 21 08/01/2022 1352   GLUCOSE 160 (H) 08/01/2022 1352   GLUCOSE 157 (H) 05/13/2017 0845   BUN 33 (H) 08/01/2022 1352   CREATININE 1.68 (H) 08/01/2022 1352   CREATININE 1.70 (H) 05/13/2017 0845   CALCIUM 9.8 08/01/2022 1352   PROT 6.8 08/01/2022 1352   ALBUMIN 4.2 08/01/2022 1352   AST 28 08/01/2022  1352   ALT 32 08/01/2022 1352   ALKPHOS 69 08/01/2022 1352   BILITOT 0.4 08/01/2022 1352   GFRNONAA 33 (L) 05/30/2020 0924   GFRAA 39 (L) 05/30/2020 0924     VAS [place] ABI WITH/WO TBI  Result Date: 11/05/2022  LOWER EXTREMITY DOPPLER STUDY Patient Name:  YEFRI VANVLIET  Date of Exam:   10/31/2022 Medical Rec #: [id number]        Accession #:    [id number] Date of Birth: [birth year]        Patient Gender: M Patient Age:   5 years Exam Location:  [facility] Procedure:      VAS [place] ABI WITH/WO TBI Referring Phys: Levora Dredge --------------------------------------------------------------------------------  Indications: Ulceration.  Performing Technologist: Debbe Bales RVS  Examination Guidelines: A complete evaluation includes at minimum, Doppler waveform signals and systolic blood pressure reading at the level of bilateral brachial, anterior tibial, and posterior tibial arteries, when vessel segments are accessible. Bilateral testing is considered an integral part of a complete examination. Photoelectric Plethysmograph (PPG) waveforms and toe systolic pressure readings are included as required and additional duplex testing as needed. Limited examinations for reoccurring indications may be performed as noted.  ABI Findings: +---------+------------------+-----+---------+--------+ Right    Rt Pressure (mmHg)IndexWaveform Comment  +---------+------------------+-----+---------+--------+  Brachial 137                                      +---------+------------------+-----+---------+--------+ ATA      212               1.55 triphasic         +---------+------------------+-----+---------+--------+ PTA      209               1.53 triphasic         +---------+------------------+-----+---------+--------+ Great Toe128               0.93 Normal            +---------+------------------+-----+---------+--------+ +---------+------------------+-----+---------+-------+ Left      Lt Pressure (mmHg)IndexWaveform Comment +---------+------------------+-----+---------+-------+ Brachial 135                                     +---------+------------------+-----+---------+-------+ ATA      165               1.20 triphasic        +---------+------------------+-----+---------+-------+ PTA      180               1.31 triphasic        +---------+------------------+-----+---------+-------+ Great Toe124               0.91 Normal           +---------+------------------+-----+---------+-------+ +-------+-----------+-----------+------------+------------+ ABI/TBIToday's ABIToday's TBIPrevious ABIPrevious TBI +-------+-----------+-----------+------------+------------+ Right  >1.0 [place]    .93                                 +-------+-----------+-----------+------------+------------+ Left   1.31       .91                                 +-------+-----------+-----------+------------+------------+  Summary: Right: Resting right ankle-brachial index indicates noncompressible right lower extremity arteries. The right toe-brachial index is normal. Left: Resting left ankle-brachial index is within normal range. The left toe-brachial index is normal. *See table(s) above for measurements and observations.   Electronically signed by Levora Dredge MD on 11/05/2022 at 8:52:23 AM.    Final        Assessment & Plan:   1. Open wound of heel, initial encounter Today noninvasive studies show that the patient should have adequate perfusion for wound healing.  We discussed possible angiogram and if the wound healing is not progressive despite continued wound care for podiatry.  Patient will follow-up with [place] on as-needed basis.  2. Diabetes mellitus with nephropathy (HCC) Continue hypoglycemic medications as already ordered, these medications have been reviewed and there are no changes at this time.  Hgb A1C to be monitored as already arranged by primary service  3.  Essential (primary) hypertension Continue antihypertensive medications as already ordered, these medications have been reviewed and there are no changes at this time.   Current Outpatient Medications on File Prior to Visit  Medication Sig Dispense Refill   Accu-Chek FastClix Lancets MISC USE TO CHECK BLOOD SUGAR  DAILY 102 each 3   ACCU-CHEK GUIDE test strip CHECK BLOOD SUGAR ONCE  DAILY FOR TYPE 2 DIABETES 100 strip 4  aspirin 81 MG tablet Take 81 mg by mouth daily.     Blood Glucose Monitoring Suppl (ACCU-CHEK GUIDE) w/Device KIT USE AS DIRECTED DAILY 1 kit 0   doxazosin (CARDURA) 2 MG tablet TAKE 1 TABLET BY MOUTH  DAILY 90 tablet 4   fluticasone (FLONASE) 50 MCG/ACT nasal spray USE 1 TO 2 SPRAYS IN BOTH  NOSTRILS DAILY 48 g 5   furosemide (LASIX) 40 MG tablet TAKE 1 TABLET BY MOUTH DAILY 90 tablet 0   gabapentin (NEURONTIN) 300 MG capsule Take 1 capsule (300 mg total) by mouth 3 (three) times daily. 270 capsule 3   glimepiride (AMARYL) 1 MG tablet TAKE 1 TABLET BY MOUTH  DAILY 90 tablet 4   HYDROcodone-acetaminophen (NORCO/VICODIN) 5-325 MG tablet TAKE 1 TABLET BY MOUTH TWICE DAILY AS NEEDED     ibuprofen (ADVIL,MOTRIN) 200 MG tablet Take 200 mg by mouth every 6 (six) hours as needed.     JARDIANCE 25 MG TABS tablet TAKE 1 TABLET BY MOUTH  DAILY 90 tablet 3   Lancets Misc. (ACCU-CHEK FASTCLIX LANCET) KIT Used to check glucose.  DX E11.9 1 kit 4   levothyroxine (SYNTHROID) 25 MCG tablet Take 1 tablet (25 mcg total) by mouth daily. 90 tablet 1   loratadine (CLARITIN) 10 MG tablet Take 10 mg by mouth daily.     losartan (COZAAR) 50 MG tablet TAKE 1 TABLET BY MOUTH DAILY 90 tablet 4   meloxicam (MOBIC) 15 MG tablet Take 15 mg by mouth daily.     metFORMIN (GLUCOPHAGE-XR) 500 MG 24 hr tablet TAKE 1 TABLET BY MOUTH TWICE  DAILY 180 tablet 1   metoprolol tartrate (LOPRESSOR) 25 MG tablet TAKE 1 TABLET BY MOUTH TWICE  DAILY 180 tablet 4   Multiple Vitamin (MULTIVITAMIN WITH MINERALS) TABS Take  1 tablet by mouth daily.     omeprazole (PRILOSEC) 40 MG capsule TAKE 1 CAPSULE BY MOUTH  DAILY 90 capsule 3   oxybutynin (DITROPAN) 5 MG tablet TAKE 1 TABLET BY MOUTH  TWICE DAILY 180 tablet 3   potassium chloride SA (KLOR-CON M) 20 MEQ tablet Take 1 tablet (20 mEq total) by mouth daily. 90 tablet 3   simvastatin (ZOCOR) 40 MG tablet TAKE 1 TABLET BY MOUTH AT  BEDTIME 90 tablet 1   traZODone (DESYREL) 100 MG tablet TAKE 1/2 TO 1 TABLET BY MOUTH AT BEDTIME AS NEEDED FOR SLEEP 90 tablet 1   Vitamin D, Ergocalciferol, (DRISDOL) 1.25 MG (50000 UNIT) CAPS capsule Take 50,000 Units by mouth every 7 (seven) days.     No current facility-administered medications on file prior to visit.    There are no Patient Instructions on file for this visit. No follow-ups on file.   Georgiana Spinner, NP

## 2022-11-21 ENCOUNTER — Other Ambulatory Visit: Payer: Self-pay | Admitting: Family Medicine

## 2022-11-22 NOTE — Telephone Encounter (Signed)
Requested Prescriptions  Pending Prescriptions Disp Refills   Accu-Chek FastClix Lancets MISC [Pharmacy Med Name: Accu-Chek FastClix Lancets] 102 each 3    Sig: USE TO CHECK BLOOD GLUCOSE AS  DIRECTED     Endocrinology: Diabetes - Testing Supplies Passed - 11/21/2022 10:41 PM      Passed - Valid encounter within last 12 months    Recent Outpatient Visits           2 weeks ago Type 2 diabetes mellitus with chronic kidney disease, without long-term current use of insulin, unspecified CKD stage (HCC)   [facility] Malva Limes, MD   3 months ago Type 2 diabetes mellitus with chronic kidney disease, without long-term current use of insulin, unspecified CKD stage (HCC)   [place] [facility] Malva Limes, MD   8 months ago Diabetes mellitus with nephropathy [facility])   [facility] Malva Limes, MD   1 year ago Diabetes mellitus with nephropathy [facility])    [facility] Malva Limes, MD   1 year ago Need for influenza vaccination   [facility] Malva Limes, MD       Future Appointments             In 2 months Fisher, Demetrios Isaacs, MD [facility]

## 2022-11-23 ENCOUNTER — Ambulatory Visit: Payer: Self-pay

## 2022-11-23 NOTE — Telephone Encounter (Signed)
  Chief Complaint: Infection not clearing - getting worse Symptoms: infection Frequency:  Pertinent Negatives: Patient denies  Disposition: [] ED /[] Urgent Care (no appt availability in office) / [] Appointment(In office/virtual)/ []  [place] [facility]/ [] Home Care/ [] Refused Recommended Disposition /[] [place] [facility]/ [x]  Follow-up with PCP Additional Notes: Pt was seen in office on 4/30 at that time dr. Sherrie Mustache prescribed cephalexin for a sore on pt's left buttock. Pt states that the sore was getting better while taking the ABX, but is now getting worse. Pt is unable to come into the office. Pt would like more ABX prescribed. [facility] uses [facility]. Please advise.    Summary: infection on bottom   Pt called saying he was in about 10 dys ago for a place on his bottom and was given an antibiotic.  He said it is still there and is better but getting worse since he has been off the antibiotic.  CB@  [phone]     Reason for Disposition  [1] Prescription refill request for ESSENTIAL medicine (i.e., likelihood of harm to patient if not taken) AND [2] triager unable to refill per department policy  Answer Assessment - Initial Assessment Questions 1. DRUG NAME: "What medicine do you need to have refilled?"     Cephalexin 2. REFILLS REMAINING: "How many refills are remaining?" (Note: The label on the medicine or pill bottle will show how many refills are remaining. If there are no refills remaining, then a renewal may be needed.)     0 3. EXPIRATION DATE: "What is the expiration date?" (Note: The label states when the prescription will expire, and thus can no longer be refilled.)     0 4. PRESCRIBING HCP: "Who prescribed it?" Reason: If prescribed by specialist, call should be referred to that group.     Dr. Sherrie Mustache 5. SYMPTOMS: "Do you have any symptoms?"     Wound on buttock is not healed  Protocols used: Medication Refill and Renewal Call-A-AH

## 2022-11-24 MED ORDER — CEPHALEXIN 500 MG PO CAPS: 500.0000 mg | ORAL_CAPSULE | Freq: Four times a day (QID) | ORAL | 0 refills | Status: DC

## 2022-11-24 NOTE — Addendum Note (Signed)
Addended by: Malva Limes on: 11/24/2022 08:10 AM   Modules accepted: Orders

## 2022-11-26 DIAGNOSIS — L578 Other skin changes due to chronic exposure to nonionizing radiation: Secondary | ICD-10-CM | POA: Diagnosis not present

## 2022-11-26 DIAGNOSIS — Z872 Personal history of diseases of the skin and subcutaneous tissue: Secondary | ICD-10-CM | POA: Diagnosis not present

## 2022-11-26 DIAGNOSIS — L570 Actinic keratosis: Secondary | ICD-10-CM | POA: Diagnosis not present

## 2022-11-26 DIAGNOSIS — Z859 Personal history of malignant neoplasm, unspecified: Secondary | ICD-10-CM | POA: Diagnosis not present

## 2022-11-26 DIAGNOSIS — Z85828 Personal history of other malignant neoplasm of skin: Secondary | ICD-10-CM | POA: Diagnosis not present

## 2022-11-27 DIAGNOSIS — M67431 Ganglion, right wrist: Secondary | ICD-10-CM | POA: Diagnosis not present

## 2022-12-02 ENCOUNTER — Other Ambulatory Visit: Payer: Self-pay | Admitting: Family Medicine

## 2022-12-05 ENCOUNTER — Other Ambulatory Visit: Payer: Self-pay | Admitting: Family Medicine

## 2022-12-05 MED ORDER — FLUTICASONE PROPIONATE 50 MCG/ACT NA SUSP: NASAL | 2 refills | Status: DC

## 2022-12-05 NOTE — Telephone Encounter (Signed)
Medication Refill - Medication: fluticasone (FLONASE) 50 MCG/ACT nasal spray [[id number]]   Has the patient contacted their pharmacy? Yes.    (Agent: If yes, when and what did the pharmacy advise?) Couldn't reach pharmacy   Preferred Pharmacy (with phone number or street name): [facility] - [place], [place] - [address]   Has the patient been seen for an appointment in the last year OR does the patient have an upcoming appointment? Yes.    Agent: Please be advised that RX refills may take up to 3 business days. We ask that you follow-up with your pharmacy.

## 2022-12-05 NOTE — Telephone Encounter (Signed)
Requested Prescriptions  Pending Prescriptions Disp Refills   fluticasone (FLONASE) 50 MCG/ACT nasal spray 48 g 2    Sig: USE 1 TO 2 SPRAYS IN BOTH  NOSTRILS DAILY     Ear, Nose, and Throat: Nasal Preparations - Corticosteroids Passed - 12/05/2022 12:05 PM      Passed - Valid encounter within last 12 months    Recent Outpatient Visits           4 weeks ago Type 2 diabetes mellitus with chronic kidney disease, without long-term current use of insulin, unspecified CKD stage (HCC)   [place] [facility] Malva Limes, MD   4 months ago Type 2 diabetes mellitus with chronic kidney disease, without long-term current use of insulin, unspecified CKD stage (HCC)   [place] [facility] Malva Limes, MD   9 months ago Diabetes mellitus with nephropathy [facility])   [place] [facility] Malva Limes, MD   1 year ago Diabetes mellitus with nephropathy [facility])   [facility] Malva Limes, MD   1 year ago Need for influenza vaccination   [place] Malva Limes, MD       Future Appointments             In 2 months Fisher, Demetrios Isaacs, MD [facility]

## 2022-12-10 ENCOUNTER — Telehealth: Payer: Self-pay

## 2022-12-10 DIAGNOSIS — M19071 Primary osteoarthritis, right ankle and foot: Secondary | ICD-10-CM

## 2022-12-10 DIAGNOSIS — M5416 Radiculopathy, lumbar region: Secondary | ICD-10-CM | POA: Diagnosis not present

## 2022-12-10 DIAGNOSIS — M5126 Other intervertebral disc displacement, lumbar region: Secondary | ICD-10-CM | POA: Diagnosis not present

## 2022-12-10 DIAGNOSIS — M5136 Other intervertebral disc degeneration, lumbar region: Secondary | ICD-10-CM | POA: Diagnosis not present

## 2022-12-10 DIAGNOSIS — M4802 Spinal stenosis, cervical region: Secondary | ICD-10-CM | POA: Diagnosis not present

## 2022-12-10 DIAGNOSIS — M1612 Unilateral primary osteoarthritis, left hip: Secondary | ICD-10-CM | POA: Diagnosis not present

## 2022-12-10 DIAGNOSIS — M5412 Radiculopathy, cervical region: Secondary | ICD-10-CM | POA: Diagnosis not present

## 2022-12-10 DIAGNOSIS — M48062 Spinal stenosis, lumbar region with neurogenic claudication: Secondary | ICD-10-CM | POA: Diagnosis not present

## 2022-12-10 DIAGNOSIS — M5030 Other cervical disc degeneration, unspecified cervical region: Secondary | ICD-10-CM | POA: Diagnosis not present

## 2022-12-10 DIAGNOSIS — Z79899 Other long term (current) drug therapy: Secondary | ICD-10-CM | POA: Diagnosis not present

## 2022-12-10 NOTE — Telephone Encounter (Signed)
Copied from CRM [id number]. Topic: Referral - Request for Referral >> Dec 10, 2022 10:12 AM Clide Dales wrote: Has patient seen PCP for this complaint? Yes.   *If NO, is insurance requiring patient see PCP for this issue before PCP can refer them? Referral for which specialty: Podiatry Preferred provider/office: Dr. Alberteen Spindle at [facility]. Reason for referral: Patient's insurance is requiring a referral even though he is a current patient.

## 2022-12-11 DIAGNOSIS — M1612 Unilateral primary osteoarthritis, left hip: Secondary | ICD-10-CM | POA: Diagnosis not present

## 2022-12-12 NOTE — Addendum Note (Signed)
Addended by: Malva Limes on: 12/12/2022 07:59 AM   Modules accepted: Orders

## 2022-12-17 DIAGNOSIS — I10 Essential (primary) hypertension: Secondary | ICD-10-CM | POA: Diagnosis not present

## 2022-12-17 DIAGNOSIS — E7800 Pure hypercholesterolemia, unspecified: Secondary | ICD-10-CM | POA: Diagnosis not present

## 2022-12-17 DIAGNOSIS — R600 Localized edema: Secondary | ICD-10-CM | POA: Diagnosis not present

## 2022-12-17 DIAGNOSIS — E119 Type 2 diabetes mellitus without complications: Secondary | ICD-10-CM | POA: Diagnosis not present

## 2022-12-17 DIAGNOSIS — I25118 Atherosclerotic heart disease of native coronary artery with other forms of angina pectoris: Secondary | ICD-10-CM | POA: Diagnosis not present

## 2022-12-17 DIAGNOSIS — G4733 Obstructive sleep apnea (adult) (pediatric): Secondary | ICD-10-CM | POA: Diagnosis not present

## 2022-12-17 DIAGNOSIS — Z951 Presence of aortocoronary bypass graft: Secondary | ICD-10-CM | POA: Diagnosis not present

## 2022-12-17 DIAGNOSIS — J449 Chronic obstructive pulmonary disease, unspecified: Secondary | ICD-10-CM | POA: Diagnosis not present

## 2022-12-17 DIAGNOSIS — K219 Gastro-esophageal reflux disease without esophagitis: Secondary | ICD-10-CM | POA: Diagnosis not present

## 2022-12-17 DIAGNOSIS — N1832 Chronic kidney disease, stage 3b: Secondary | ICD-10-CM | POA: Diagnosis not present

## 2022-12-17 DIAGNOSIS — R0602 Shortness of breath: Secondary | ICD-10-CM | POA: Diagnosis not present

## 2022-12-20 ENCOUNTER — Other Ambulatory Visit: Payer: Self-pay | Admitting: Family Medicine

## 2022-12-20 DIAGNOSIS — M5416 Radiculopathy, lumbar region: Secondary | ICD-10-CM | POA: Diagnosis not present

## 2022-12-20 DIAGNOSIS — N3941 Urge incontinence: Secondary | ICD-10-CM

## 2022-12-20 DIAGNOSIS — M48062 Spinal stenosis, lumbar region with neurogenic claudication: Secondary | ICD-10-CM | POA: Diagnosis not present

## 2022-12-21 ENCOUNTER — Other Ambulatory Visit: Payer: Self-pay | Admitting: Family Medicine

## 2022-12-21 DIAGNOSIS — E039 Hypothyroidism, unspecified: Secondary | ICD-10-CM

## 2023-01-03 ENCOUNTER — Other Ambulatory Visit: Payer: Self-pay | Admitting: Family Medicine

## 2023-01-03 DIAGNOSIS — E119 Type 2 diabetes mellitus without complications: Secondary | ICD-10-CM | POA: Diagnosis not present

## 2023-01-03 DIAGNOSIS — M5412 Radiculopathy, cervical region: Secondary | ICD-10-CM

## 2023-01-03 DIAGNOSIS — B351 Tinea unguium: Secondary | ICD-10-CM | POA: Diagnosis not present

## 2023-01-03 DIAGNOSIS — L851 Acquired keratosis [keratoderma] palmaris et plantaris: Secondary | ICD-10-CM | POA: Diagnosis not present

## 2023-01-12 ENCOUNTER — Other Ambulatory Visit: Payer: Self-pay | Admitting: Family Medicine

## 2023-01-14 NOTE — Telephone Encounter (Signed)
Requested Prescriptions  Pending Prescriptions Disp Refills   simvastatin (ZOCOR) 40 MG tablet [Pharmacy Med Name: Simvastatin 40 MG Oral Tablet] 90 tablet 0    Sig: TAKE 1 TABLET BY MOUTH AT  BEDTIME     Cardiovascular:  Antilipid - Statins Failed - 01/12/2023 10:47 PM      Failed - Lipid Panel in normal range within the last 12 months    Cholesterol, Total  Date Value Ref Range Status  08/01/2022 128 100 - 199 mg/dL Final   LDL Chol Calc (NIH)  Date Value Ref Range Status  08/01/2022 47 0 - 99 mg/dL Final   HDL  Date Value Ref Range Status  08/01/2022 46 >39 mg/dL Final   Triglycerides  Date Value Ref Range Status  08/01/2022 220 (H) 0 - 149 mg/dL Final         Passed - Patient is not pregnant      Passed - Valid encounter within last 12 months    Recent Outpatient Visits           2 months ago Type 2 diabetes mellitus with chronic kidney disease, without long-term current use of insulin, unspecified CKD stage (HCC)   [place] [facility] Malva Limes, MD   5 months ago Type 2 diabetes mellitus with chronic kidney disease, without long-term current use of insulin, unspecified CKD stage (HCC)   [place] [facility] Malva Limes, MD   10 months ago Diabetes mellitus with nephropathy [facility])   [place] [facility] Malva Limes, MD   1 year ago Diabetes mellitus with nephropathy [facility])   [facility] Malva Limes, MD   1 year ago Need for influenza vaccination   [facility] Malva Limes, MD       Future Appointments             In 4 weeks Fisher, Demetrios Isaacs, MD [facility]

## 2023-01-18 ENCOUNTER — Other Ambulatory Visit: Payer: Self-pay | Admitting: Family Medicine

## 2023-01-18 DIAGNOSIS — M5136 Other intervertebral disc degeneration, lumbar region: Secondary | ICD-10-CM | POA: Diagnosis not present

## 2023-01-18 DIAGNOSIS — M48062 Spinal stenosis, lumbar region with neurogenic claudication: Secondary | ICD-10-CM | POA: Diagnosis not present

## 2023-01-18 DIAGNOSIS — M5126 Other intervertebral disc displacement, lumbar region: Secondary | ICD-10-CM | POA: Diagnosis not present

## 2023-01-18 DIAGNOSIS — M5416 Radiculopathy, lumbar region: Secondary | ICD-10-CM

## 2023-01-18 DIAGNOSIS — M4807 Spinal stenosis, lumbosacral region: Secondary | ICD-10-CM | POA: Diagnosis not present

## 2023-02-05 ENCOUNTER — Ambulatory Visit
Admission: RE | Admit: 2023-02-05 | Discharge: 2023-02-05 | Disposition: A | Discharge status: Home / Self Care | Payer: Medicare Other | Source: Ambulatory Visit | Attending: Family Medicine | Admitting: Family Medicine

## 2023-02-05 DIAGNOSIS — M5126 Other intervertebral disc displacement, lumbar region: Secondary | ICD-10-CM | POA: Diagnosis not present

## 2023-02-05 DIAGNOSIS — M47816 Spondylosis without myelopathy or radiculopathy, lumbar region: Secondary | ICD-10-CM | POA: Diagnosis not present

## 2023-02-05 DIAGNOSIS — M5416 Radiculopathy, lumbar region: Secondary | ICD-10-CM

## 2023-02-12 ENCOUNTER — Ambulatory Visit: Payer: Medicare Other | Admitting: Family Medicine

## 2023-02-21 DIAGNOSIS — M48062 Spinal stenosis, lumbar region with neurogenic claudication: Secondary | ICD-10-CM | POA: Diagnosis not present

## 2023-02-21 DIAGNOSIS — Z79899 Other long term (current) drug therapy: Secondary | ICD-10-CM | POA: Diagnosis not present

## 2023-02-21 DIAGNOSIS — M5412 Radiculopathy, cervical region: Secondary | ICD-10-CM | POA: Diagnosis not present

## 2023-02-21 DIAGNOSIS — M4807 Spinal stenosis, lumbosacral region: Secondary | ICD-10-CM | POA: Diagnosis not present

## 2023-02-21 DIAGNOSIS — M47816 Spondylosis without myelopathy or radiculopathy, lumbar region: Secondary | ICD-10-CM | POA: Diagnosis not present

## 2023-02-21 DIAGNOSIS — M4802 Spinal stenosis, cervical region: Secondary | ICD-10-CM | POA: Diagnosis not present

## 2023-02-21 DIAGNOSIS — M5416 Radiculopathy, lumbar region: Secondary | ICD-10-CM | POA: Diagnosis not present

## 2023-02-21 DIAGNOSIS — M1612 Unilateral primary osteoarthritis, left hip: Secondary | ICD-10-CM | POA: Diagnosis not present

## 2023-02-27 DIAGNOSIS — G4733 Obstructive sleep apnea (adult) (pediatric): Secondary | ICD-10-CM | POA: Diagnosis not present

## 2023-03-04 ENCOUNTER — Ambulatory Visit (INDEPENDENT_AMBULATORY_CARE_PROVIDER_SITE_OTHER): Payer: Medicare Other | Admitting: Family Medicine

## 2023-03-04 ENCOUNTER — Encounter: Payer: Self-pay | Admitting: Family Medicine

## 2023-03-04 VITALS — BP 129/76 | HR 98 | Wt 210.0 lb

## 2023-03-04 DIAGNOSIS — Z23 Encounter for immunization: Secondary | ICD-10-CM | POA: Diagnosis not present

## 2023-03-04 DIAGNOSIS — L0591 Pilonidal cyst without abscess: Secondary | ICD-10-CM | POA: Diagnosis not present

## 2023-03-04 DIAGNOSIS — E1122 Type 2 diabetes mellitus with diabetic chronic kidney disease: Secondary | ICD-10-CM | POA: Diagnosis not present

## 2023-03-04 DIAGNOSIS — Z7984 Long term (current) use of oral hypoglycemic drugs: Secondary | ICD-10-CM

## 2023-03-04 DIAGNOSIS — E039 Hypothyroidism, unspecified: Secondary | ICD-10-CM

## 2023-03-04 DIAGNOSIS — M5116 Intervertebral disc disorders with radiculopathy, lumbar region: Secondary | ICD-10-CM | POA: Diagnosis not present

## 2023-03-04 LAB — POCT GLYCOSYLATED HEMOGLOBIN (HGB A1C): Hemoglobin A1C: 8.2 % — AB (ref 4.0–5.6)

## 2023-03-04 MED ORDER — DULOXETINE HCL 30 MG PO CPEP
30.0000 mg | ORAL_CAPSULE | Freq: Every day | ORAL | 2 refills | Status: DC
Start: 2023-03-04 — End: 2023-05-06

## 2023-03-04 MED ORDER — CEPHALEXIN 500 MG PO CAPS
500.0000 mg | ORAL_CAPSULE | Freq: Four times a day (QID) | ORAL | 0 refills | Status: DC
Start: 2023-03-04 — End: 2023-05-06

## 2023-03-04 NOTE — Patient Instructions (Signed)
.   Please review the attached list of medications and notify my office if there are any errors.   . Please bring all of your medications to every appointment so we can make sure that our medication list is the same as yours.   

## 2023-03-04 NOTE — Progress Notes (Signed)
Established patient visit   Patient: Mark Terry.   DOB: May 05, 1935   88 y.o. Male  MRN: [id number] Visit Date: 03/04/2023  Today's healthcare provider: Mila Merry, MD   Chief Complaint  Patient presents with   Medical Management of Chronic Issues   Diabetes   Hypertension   Hyperlipidemia   Subjective    Discussed the use of AI scribe software for clinical note transcription with the patient, who gave verbal consent to proceed.  History of Present Illness   The patient, with a history of chronic back pain, presents for follow up of type 2 diabetes on metformin, Jardiance and glimiperide. He has no problems with current medications and has no episodes of hypoglycemia. He is also due for for follow up hypothyroid and CKD.    He reports a significant increase in back pain severity, to the point of expressing a loss of will to live due to the unbearable pain. He reports that his back is extremely tender, particularly where his underwear crosses his spine, and he can hardly bear the touch of the elastic on his skin. He also mentions a cyst in the same area that has been continuously draining for the past month. The patient has been managing his pain with hydrocodone, which was increased in dosage a month ago, but he reports no noticeable improvement. He is also on gabapentin, but is unsure of its effectiveness.  The patient's pain has been so severe that he has had to stop his regular exercise routine, which he believes was keeping him alive. He has been exercising for half an hour, five days a Terry, but has not been able to do so for over a month due to the pain.  The patient is scheduled for nerve ablation on his tailbone this Friday with Dr. Yves Dill, which he hopes will alleviate some of the pain. He has a history of bulging discs in his spine, which may be contributing to his current pain. He also has a history of thyroid and kidney issues, and is due for lab tests to  monitor these conditions. He reports no issues with his breathing or heart, and his lung doctor has advised him to return in a year. He denies any chest pains or heart flutters.       Medications: Outpatient Medications Prior to Visit  Medication Sig   Accu-Chek FastClix Lancets MISC USE TO CHECK BLOOD GLUCOSE AS  DIRECTED   ACCU-CHEK GUIDE test strip CHECK BLOOD SUGAR ONCE  DAILY FOR TYPE 2 DIABETES   aspirin 81 MG tablet Take 81 mg by mouth daily.   Blood Glucose Monitoring Suppl (ACCU-CHEK GUIDE) w/Device KIT USE AS DIRECTED DAILY   doxazosin (CARDURA) 2 MG tablet TAKE 1 TABLET BY MOUTH  DAILY   fluticasone (FLONASE) 50 MCG/ACT nasal spray USE 1 TO 2 SPRAYS IN BOTH  NOSTRILS DAILY   furosemide (LASIX) 40 MG tablet TAKE 1 TABLET BY MOUTH DAILY   gabapentin (NEURONTIN) 300 MG capsule TAKE 1 CAPSULE BY MOUTH 3 TIMES  DAILY   glimepiride (AMARYL) 1 MG tablet TAKE 1 TABLET BY MOUTH  DAILY   HYDROcodone-acetaminophen (NORCO/VICODIN) 5-325 MG tablet TAKE 1 TABLET BY MOUTH TWICE DAILY AS NEEDED   ibuprofen (ADVIL,MOTRIN) 200 MG tablet Take 200 mg by mouth every 6 (six) hours as needed.   JARDIANCE 25 MG TABS tablet TAKE 1 TABLET BY MOUTH  DAILY   Lancets Misc. (ACCU-CHEK FASTCLIX LANCET) KIT Used to check glucose.  DX E11.9   levothyroxine (SYNTHROID) 25 MCG tablet TAKE 1 TABLET BY MOUTH DAILY   loratadine (CLARITIN) 10 MG tablet Take 10 mg by mouth daily.   losartan (COZAAR) 50 MG tablet TAKE 1 TABLET BY MOUTH DAILY   meloxicam (MOBIC) 15 MG tablet Take 15 mg by mouth daily.   metFORMIN (GLUCOPHAGE-XR) 500 MG 24 hr tablet TAKE 1 TABLET BY MOUTH TWICE  DAILY   metoprolol tartrate (LOPRESSOR) 25 MG tablet TAKE 1 TABLET BY MOUTH TWICE  DAILY   Multiple Vitamin (MULTIVITAMIN WITH MINERALS) TABS Take 1 tablet by mouth daily.   omeprazole (PRILOSEC) 40 MG capsule TAKE 1 CAPSULE BY MOUTH  DAILY   oxybutynin (DITROPAN) 5 MG tablet TAKE 1 TABLET BY MOUTH TWICE  DAILY   potassium chloride SA (KLOR-CON  M) 20 MEQ tablet Take 1 tablet (20 mEq total) by mouth daily.   simvastatin (ZOCOR) 40 MG tablet TAKE 1 TABLET BY MOUTH AT  BEDTIME   traZODone (DESYREL) 100 MG tablet TAKE 1/2 TO 1 TABLET BY MOUTH AT BEDTIME AS NEEDED FOR SLEEP   Vitamin D, Ergocalciferol, (DRISDOL) 1.25 MG (50000 UNIT) CAPS capsule Take 50,000 Units by mouth every 7 (seven) days.   No facility-administered medications prior to visit.   Review of Systems     Objective    BP 129/76 (BP Location: Left Arm, Patient Position: Sitting, Cuff Size: Large)   Pulse 98   Wt 210 lb (95.3 kg) Comment: patient provided  BMI 39.68 kg/m   Physical Exam  Physical Exam   CHEST: Lungs clear to auscultation. EXTREMITIES: Mild edema in legs, right leg chronically edematous.     Results for orders placed or performed in visit on 03/04/23  POCT HgB A1C  Result Value Ref Range   Hemoglobin A1C 8.2 (A) 4.0 - 5.6 %    Assessment & Plan     1. Type 2 diabetes mellitus with chronic kidney disease, without long-term current use of insulin, unspecified CKD stage (HCC) Continue current medications.   - Comprehensive metabolic panel  2. Need for influenza vaccination  - Flu Vaccine QUAD High Dose(Fluad)  3. Hypothyroidism, unspecified type  - TSH - T4, free     Chronic Back Pain Severe pain, not well controlled with current regimen of hydrocodone and gabapentin. Patient reports significant distress and impact on quality of life. Ablation scheduled for Friday. -Start Cymbalta for additional nerve pain control. -Continue hydrocodone 7.5/325mg  and gabapentin as prescribed. -Check thyroid and kidney function today.  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 Claudine Mouton, last seen spring 2023 and not interested in additional surgery at that time. typically improves transiently with abx.  -Start Cephalexin to clear current infection.        Return in about 6 weeks (around 04/15/2023) for pain meds, cyst on back. See how pain control is doing on duloxetine at follow up.      Mila Merry, MD  [facility] [phone] (phone) [phone] (fax)  [facility]

## 2023-03-05 ENCOUNTER — Other Ambulatory Visit: Payer: Self-pay | Admitting: Family Medicine

## 2023-03-05 DIAGNOSIS — E039 Hypothyroidism, unspecified: Secondary | ICD-10-CM

## 2023-03-05 LAB — COMPREHENSIVE METABOLIC PANEL
ALT: 23 IU/L (ref 0–44)
AST: 28 IU/L (ref 0–40)
Albumin: 4 g/dL (ref 3.7–4.7)
Alkaline Phosphatase: 81 IU/L (ref 44–121)
BUN/Creatinine Ratio: 15 (ref 10–24)
BUN: 27 mg/dL (ref 8–27)
Bilirubin Total: 0.2 mg/dL (ref 0.0–1.2)
CO2: 21 mmol/L (ref 20–29)
Calcium: 9.8 mg/dL (ref 8.6–10.2)
Chloride: 104 mmol/L (ref 96–106)
Creatinine, Ser: 1.85 mg/dL — ABNORMAL HIGH (ref 0.76–1.27)
Globulin, Total: 2.7 g/dL (ref 1.5–4.5)
Glucose: 169 mg/dL — ABNORMAL HIGH (ref 70–99)
Potassium: 4.6 mmol/L (ref 3.5–5.2)
Sodium: 143 mmol/L (ref 134–144)
Total Protein: 6.7 g/dL (ref 6.0–8.5)
eGFR: 35 mL/min/{1.73_m2} — ABNORMAL LOW (ref >59)

## 2023-03-05 LAB — TSH: TSH: 6.22 u[IU]/mL — ABNORMAL HIGH (ref 0.450–4.500)

## 2023-03-05 LAB — T4, FREE: Free T4: 1.07 ng/dL (ref 0.82–1.77)

## 2023-03-05 MED ORDER — LEVOTHYROXINE SODIUM 25 MCG PO TABS
25.0000 ug | ORAL_TABLET | Freq: Every day | ORAL | 2 refills | Status: AC
Start: 2023-03-05 — End: ?

## 2023-03-05 NOTE — Addendum Note (Signed)
Addended by: Lily Kocher on: 03/05/2023 08:24 AM   Modules accepted: Orders

## 2023-03-06 MED ORDER — LEVOTHYROXINE SODIUM 50 MCG PO TABS
50.0000 ug | ORAL_TABLET | Freq: Every day | ORAL | 2 refills | Status: DC
Start: 2023-03-06 — End: 2023-12-27

## 2023-03-08 DIAGNOSIS — M47816 Spondylosis without myelopathy or radiculopathy, lumbar region: Secondary | ICD-10-CM | POA: Diagnosis not present

## 2023-03-16 ENCOUNTER — Other Ambulatory Visit: Payer: Self-pay | Admitting: Family Medicine

## 2023-03-26 ENCOUNTER — Ambulatory Visit: Payer: Self-pay | Admitting: *Deleted

## 2023-03-26 NOTE — Telephone Encounter (Signed)
  Chief Complaint: cyst still draining after antibiotic course  Symptoms: drainage from cyst- soaks through clothing Frequency: treated 03/04/23- patient completed antibiotic treatment Pertinent Negatives: Patient denies fever Disposition: [] ED /[] Urgent Care (no appt availability in office) / [x] Appointment(In office/virtual)/ []  [place] [facility]/ [] Home Care/ [] Refused Recommended Disposition /[] [place] [facility]/ []  Follow-up with PCP Additional Notes: Patient is calling to request additional treatment for cyst- he reports not healed- still draining. Patient states he is at the point of needing assistance in the home. He is unable to care for this wound/cyst due to location.  Offered appointment in the office sooner than first available with PCP- but patient has to arrange transportation and he declines other provider. Patient advised to call back if he develops fever ot gets worse for sooner appointment.

## 2023-03-26 NOTE — Telephone Encounter (Signed)
Summary: Requesting more Abx, symptomatic   Pt called reporting that the most recent round of antibiotics did not clear his symptoms, he is requesting more antibiotics.  [facility] - [place], [place] - [address] [place] [address] Phone: [phone] Fax: [phone]         Reason for Disposition  [1] Taking antibiotic > 72 hours (3 days) AND [2] symptoms (other than fever) not improved  Answer Assessment - Initial Assessment Questions 1. INFECTION: "What infection is the antibiotic being given for?"     Pilonedel cyst 2. ANTIBIOTIC: "What antibiotic are you taking" "How many times per day?"      Cephalexin 500 mg Oral 4 times daily 3. DURATION: "When was the antibiotic started?"     03/04/23- took all 4. MAIN CONCERN OR SYMPTOM:  "What is your main concern right now?"     Cyst still draining 5. BETTER-SAME-WORSE: "Are you getting better, staying the same, or getting worse compared to when you first started the antibiotics?" If getting worse, ask: "In what way?"      Can not tell- not able to see- lives alone 6. FEVER: "Do you have a fever?" If Yes, ask: "What is your temperature, how was it measured, and when did it start?"     no 7. SYMPTOMS: "Are there any other symptoms you're concerned about?" If Yes, ask: "When did it start?"     Drainage from cyst 8. FOLLOW-UP APPOINTMENT: "Do you have a follow-up appointment with your doctor?"     *No Answer*  Protocols used: Infection on Antibiotic Follow-up Call-A-AH

## 2023-03-28 ENCOUNTER — Telehealth: Payer: Self-pay

## 2023-03-28 DIAGNOSIS — L0591 Pilonidal cyst without abscess: Secondary | ICD-10-CM

## 2023-03-28 NOTE — Telephone Encounter (Signed)
Have placed referral, he doesn't have to come in for this if this is the only reason he made appointment for tomorrow.

## 2023-03-28 NOTE — Telephone Encounter (Signed)
Copied from CRM [id number]. Topic: Referral - Request for Referral >> Mar 28, 2023  8:44 AM Marlow Baars wrote: Has patient seen PCP for this complaint? Yes.   Referral for which specialty: General Surgery Preferred provider/office: [facility] Reason for referral: Cyst on his lower back that he says he has had since he was a young adult. Antibiotics helped briefly but it leaks through his clothes and really bothers him  The family is requesting for him to go to a surgeon so this cyst which has been bothering him for quite some time can just be removed. Please assist patient further as he is scheduled to see Dr Sherrie Mustache tomorrow but if his provider doesn't need to see him and will just do the referral he would like to know before the appt is cancelled

## 2023-03-28 NOTE — Telephone Encounter (Signed)
Please review. He scheduled with you tomorrow at 4:20 pm.  Have pended referral for you. Needs signature.

## 2023-03-28 NOTE — Telephone Encounter (Signed)
Patient advised. Appointment canceled by patient

## 2023-03-29 ENCOUNTER — Ambulatory Visit: Payer: Medicare Other | Admitting: Family Medicine

## 2023-04-04 ENCOUNTER — Ambulatory Visit: Payer: Medicare Other | Admitting: Surgery

## 2023-04-04 ENCOUNTER — Encounter: Payer: Self-pay | Admitting: Surgery

## 2023-04-04 VITALS — BP 111/74 | HR 105 | Temp 98.2°F | Ht 61.0 in | Wt 207.0 lb

## 2023-04-04 DIAGNOSIS — L0591 Pilonidal cyst without abscess: Secondary | ICD-10-CM | POA: Diagnosis not present

## 2023-04-04 NOTE — Progress Notes (Addendum)
Mr. Leece returns today . He presents with his daughter who does not live locally.  I understand he has a couple herniated disks and has had some significant pain from these.  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  We did evaluate his cleft issue again today.  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  There does remain some malodorous drainage which does not appear to be coming from an abscess, or significant tunneling or tracking or deeper process.  It does create some degree of excoriation of the adjacent skin in the cleft area and makes that area tender.  Unfortunately he is sufficiently debilitated so as to be unable to fully care for this point of hygiene himself.  He has had such pain from his herniated disc that he is willing to take all risks despite his poor candidacy for the operation.  Hopefully we can obtain some in-house assistance to perform focused cleansing of this area and maintenance of dryness will be helpful, and hopefully prevent any recurrence or significant inflammatory issues.  As he seems desperate to receive some relief from this pain, unfortunately I am unable to assist in aiding his wound care to this difficult to treat area.  Any additional attempt to surgically treat this area will likely result in a larger more vulnerable wound at this time. I spend over 30 minutes with this patient and his daughter, attempting to coordinate and develop some form of care to aid him with his pain syndrome from his lower back disks and his coccygeal wound.

## 2023-04-04 NOTE — Patient Instructions (Addendum)
We will send an order for medical home health services to assess the area and start wound care.   Keep the area clean and dry as much as possible. You may use a skin barrier ointment to the area.

## 2023-04-05 NOTE — Addendum Note (Signed)
Addended by: Campbell Lerner on: 04/05/2023 03:28 PM   Modules accepted: Level of Service

## 2023-04-07 ENCOUNTER — Other Ambulatory Visit: Payer: Self-pay | Admitting: Family Medicine

## 2023-04-08 ENCOUNTER — Ambulatory Visit: Payer: Self-pay

## 2023-04-08 DIAGNOSIS — M4802 Spinal stenosis, cervical region: Secondary | ICD-10-CM | POA: Diagnosis not present

## 2023-04-08 DIAGNOSIS — Z993 Dependence on wheelchair: Secondary | ICD-10-CM | POA: Diagnosis not present

## 2023-04-08 DIAGNOSIS — E785 Hyperlipidemia, unspecified: Secondary | ICD-10-CM | POA: Diagnosis not present

## 2023-04-08 DIAGNOSIS — B379 Candidiasis, unspecified: Secondary | ICD-10-CM | POA: Diagnosis not present

## 2023-04-08 DIAGNOSIS — M4807 Spinal stenosis, lumbosacral region: Secondary | ICD-10-CM | POA: Diagnosis not present

## 2023-04-08 DIAGNOSIS — N189 Chronic kidney disease, unspecified: Secondary | ICD-10-CM | POA: Diagnosis not present

## 2023-04-08 DIAGNOSIS — E1122 Type 2 diabetes mellitus with diabetic chronic kidney disease: Secondary | ICD-10-CM | POA: Diagnosis not present

## 2023-04-08 DIAGNOSIS — Z556 Problems related to health literacy: Secondary | ICD-10-CM | POA: Diagnosis not present

## 2023-04-08 DIAGNOSIS — Z79899 Other long term (current) drug therapy: Secondary | ICD-10-CM | POA: Diagnosis not present

## 2023-04-08 DIAGNOSIS — Z7982 Long term (current) use of aspirin: Secondary | ICD-10-CM | POA: Diagnosis not present

## 2023-04-08 DIAGNOSIS — M4726 Other spondylosis with radiculopathy, lumbar region: Secondary | ICD-10-CM | POA: Diagnosis not present

## 2023-04-08 DIAGNOSIS — I129 Hypertensive chronic kidney disease with stage 1 through stage 4 chronic kidney disease, or unspecified chronic kidney disease: Secondary | ICD-10-CM | POA: Diagnosis not present

## 2023-04-08 DIAGNOSIS — Z9181 History of falling: Secondary | ICD-10-CM | POA: Diagnosis not present

## 2023-04-08 DIAGNOSIS — L0591 Pilonidal cyst without abscess: Secondary | ICD-10-CM | POA: Diagnosis not present

## 2023-04-08 DIAGNOSIS — Z604 Social exclusion and rejection: Secondary | ICD-10-CM | POA: Diagnosis not present

## 2023-04-08 DIAGNOSIS — M5412 Radiculopathy, cervical region: Secondary | ICD-10-CM | POA: Diagnosis not present

## 2023-04-08 DIAGNOSIS — Z7984 Long term (current) use of oral hypoglycemic drugs: Secondary | ICD-10-CM | POA: Diagnosis not present

## 2023-04-08 DIAGNOSIS — M5116 Intervertebral disc disorders with radiculopathy, lumbar region: Secondary | ICD-10-CM | POA: Diagnosis not present

## 2023-04-08 DIAGNOSIS — E039 Hypothyroidism, unspecified: Secondary | ICD-10-CM | POA: Diagnosis not present

## 2023-04-08 DIAGNOSIS — Z791 Long term (current) use of non-steroidal anti-inflammatories (NSAID): Secondary | ICD-10-CM | POA: Diagnosis not present

## 2023-04-08 NOTE — Telephone Encounter (Signed)
Noted  

## 2023-04-08 NOTE — Telephone Encounter (Signed)
Copied from CRM [id number]. Topic: Appointment Scheduling - Scheduling Inquiry for Clinic >> Apr 08, 2023  4:10 PM Runell Gess P wrote: Reason for CRM: pt called saying he does not think he has anyone to bring a sample but he is going to try to see if he can find someone.   CB#  [phone]

## 2023-04-08 NOTE — Telephone Encounter (Signed)
Summary: burning and slow stream with urination   Pt called in, states has burning when urinating and only slow stream comes out when he goes. He was told by home health he may have UTI. He is asking for med t be sent in because he is unable to come to the office         Chief Complaint: Burning, weak urinary stream. States he cannot come in for OV. [facility] came in to see him today to set up services. [phone]. Asking for medication. Symptoms: Above. "I don't feel good." Frequency: 2-3 days ago Pertinent Negatives: Patient denies fever Disposition: [] ED /[] Urgent Care (no appt availability in office) / [] Appointment(In office/virtual)/ []  [place] [facility]/ [] Home Care/ [x] Refused Recommended Disposition /[] [place] [facility]/ [x]  Follow-up with PCP Additional Notes: Please advise pt.  Reason for Disposition  Urinating more frequently than usual (i.e., frequency)  Answer Assessment - Initial Assessment Questions 1. SYMPTOM: "What's the main symptom you're concerned about?" (e.g., frequency, incontinence)     Burning, weak stream 2. ONSET: "When did the    start?"     2-3 days ago 3. PAIN: "Is there any pain?" If Yes, ask: "How bad is it?" (Scale: 1-10; mild, moderate, severe)     8 4. CAUSE: "What do you think is causing the symptoms?"     UTI 5. OTHER SYMPTOMS: "Do you have any other symptoms?" (e.g., blood in urine, fever, flank pain, pain with urination)     Feels bad 6. PREGNANCY: "Is there any chance you are pregnant?" "When was your last menstrual period?"     N/a  Protocols used: Urinary Symptoms-A-AH

## 2023-04-09 ENCOUNTER — Ambulatory Visit (INDEPENDENT_AMBULATORY_CARE_PROVIDER_SITE_OTHER): Payer: Medicare Other | Admitting: Family Medicine

## 2023-04-09 DIAGNOSIS — R300 Dysuria: Secondary | ICD-10-CM

## 2023-04-09 DIAGNOSIS — L0591 Pilonidal cyst without abscess: Secondary | ICD-10-CM | POA: Diagnosis not present

## 2023-04-09 DIAGNOSIS — N390 Urinary tract infection, site not specified: Secondary | ICD-10-CM

## 2023-04-09 MED ORDER — AMOXICILLIN-POT CLAVULANATE 875-125 MG PO TABS
1.0000 | ORAL_TABLET | Freq: Two times a day (BID) | ORAL | 0 refills | Status: AC
Start: 2023-04-09 — End: 2023-04-19

## 2023-04-09 NOTE — Progress Notes (Signed)
Virtual telephone visit    Virtual Visit via Telephone Note   This format is felt to be most appropriate for this patient at this time. The patient did not have access to video technology or had technical difficulties with video requiring transitioning to audio format only (telephone). Physical exam was limited to content and character of the telephone converstion.    Patient location: home Provider location: [facility]  I discussed the limitations of evaluation and management by telemedicine and the availability of in person appointments. The patient expressed understanding and agreed to proceed.   Visit Date: 04/09/2023  Today's healthcare provider: Mila Merry, MD    Subjective    Discussed the use of AI scribe software for clinical note transcription with the patient, who gave verbal consent to proceed.  History of Present Illness   The patient reports he has been feeling unwell for the past three weeks. He reports a history of a cyst on his spine, which has been draining for about three weeks. He saw surgeon last weeks and told it couldn't be treated surgically. Despite previous treatment with cephalexin, the cyst has not improved. The patient also reports pain from two ruptured discs.  In addition to these issues, the patient has been experiencing symptoms suggestive of a urinary tract infection for the past three days. He reports a burning sensation during urination and a decrease in urinary output.  The patient also reports fluctuations in body temperature over the past one to two weeks, with periods of feeling cold despite a warm ambient temperature.  The patient's mobility has been significantly affected by his current health issues. He reports difficulty walking, even within his own home, and is currently living alone. Home health services have been initiated, but the frequency and extent of these services are not specified in the conversation.  The patient's overall  condition has led to a significant decrease in his quality of life, with the patient reporting that he is unable to leave his house or engage in normal activities.         Medications: Outpatient Medications Prior to Visit  Medication Sig   Accu-Chek FastClix Lancets MISC USE TO CHECK BLOOD GLUCOSE AS  DIRECTED   ACCU-CHEK GUIDE test strip CHECK BLOOD SUGAR ONCE  DAILY FOR TYPE 2 DIABETES   aspirin 81 MG tablet Take 81 mg by mouth daily.   Blood Glucose Monitoring Suppl (ACCU-CHEK GUIDE) w/Device KIT USE AS DIRECTED DAILY   doxazosin (CARDURA) 2 MG tablet TAKE 1 TABLET BY MOUTH  DAILY   DULoxetine (CYMBALTA) 30 MG capsule Take 1 capsule (30 mg total) by mouth daily.   fluticasone (FLONASE) 50 MCG/ACT nasal spray USE 1 TO 2 SPRAYS IN BOTH  NOSTRILS DAILY   furosemide (LASIX) 40 MG tablet TAKE 1 TABLET BY MOUTH DAILY   gabapentin (NEURONTIN) 300 MG capsule TAKE 1 CAPSULE BY MOUTH 3 TIMES  DAILY   glimepiride (AMARYL) 1 MG tablet TAKE 1 TABLET BY MOUTH  DAILY   HYDROcodone-acetaminophen (NORCO/VICODIN) 5-325 MG tablet TAKE 1 TABLET BY MOUTH TWICE DAILY AS NEEDED   ibuprofen (ADVIL,MOTRIN) 200 MG tablet Take 200 mg by mouth every 6 (six) hours as needed.   JARDIANCE 25 MG TABS tablet TAKE 1 TABLET BY MOUTH  DAILY   Lancets Misc. (ACCU-CHEK FASTCLIX LANCET) KIT Used to check glucose.  DX E11.9   levothyroxine (SYNTHROID) 50 MCG tablet Take 1 tablet (50 mcg total) by mouth daily.   loratadine (CLARITIN) 10 MG tablet Take  10 mg by mouth daily.   losartan (COZAAR) 50 MG tablet TAKE 1 TABLET BY MOUTH DAILY   meloxicam (MOBIC) 15 MG tablet Take 15 mg by mouth daily.   metFORMIN (GLUCOPHAGE-XR) 500 MG 24 hr tablet TAKE 1 TABLET BY MOUTH TWICE  DAILY   metoprolol tartrate (LOPRESSOR) 25 MG tablet TAKE 1 TABLET BY MOUTH TWICE  DAILY   Multiple Vitamin (MULTIVITAMIN WITH MINERALS) TABS Take 1 tablet by mouth daily.   omeprazole (PRILOSEC) 40 MG capsule TAKE 1 CAPSULE BY MOUTH DAILY   oxybutynin  (DITROPAN) 5 MG tablet TAKE 1 TABLET BY MOUTH TWICE  DAILY   potassium chloride SA (KLOR-CON M) 20 MEQ tablet Take 1 tablet (20 mEq total) by mouth daily.   simvastatin (ZOCOR) 40 MG tablet TAKE 1 TABLET BY MOUTH AT  BEDTIME   traZODone (DESYREL) 100 MG tablet TAKE 1/2 TO 1 TABLET BY MOUTH AT BEDTIME AS NEEDED FOR SLEEP   Vitamin D, Ergocalciferol, (DRISDOL) 1.25 MG (50000 UNIT) CAPS capsule Take 50,000 Units by mouth every 7 (seven) days.   No facility-administered medications prior to visit.    Review of Systems      Objective    There were no vitals taken for this visit.   Awake, alert, oriented x 3. In no apparent distress   Assessment & Plan        Urinary Tract Infection Reports dysuria and decreased urinary output for the past three days. -Start Augmentin and send to [facility].  Pilonidal Cyst Chronic, non-healing cyst with recent increased drainage, although unclear if this is from the pilonidal cyst as he had evaluation by surgeon last week. No current antibiotic treatment. -Start Augmentin and send to [facility].  Mobility Issues Reports difficulty walking, even with assistance. Home health involved. -Continue home health services for assistance and monitoring.  Follow-up Home health nurse expected to visit today. -Home health to monitor response to antibiotic treatment.         I discussed the assessment and treatment plan with the patient. The patient was provided an opportunity to ask questions and all were answered. The patient agreed with the plan and demonstrated an understanding of the instructions.   The patient was advised to call back or seek an in-person evaluation if the symptoms worsen or if the condition fails to improve as anticipated.  I provided 8 minutes of non-face-to-face time during this encounter.    Mila Merry, MD [facility] [phone] (phone) [phone] (fax)  [facility]

## 2023-04-10 ENCOUNTER — Emergency Department
Admission: EM | Admit: 2023-04-10 | Discharge: 2023-04-10 | Disposition: A | Discharge status: Home / Self Care | Payer: Medicare Other | Attending: Emergency Medicine | Admitting: Emergency Medicine

## 2023-04-10 ENCOUNTER — Other Ambulatory Visit: Payer: Self-pay

## 2023-04-10 DIAGNOSIS — G8929 Other chronic pain: Secondary | ICD-10-CM | POA: Diagnosis not present

## 2023-04-10 DIAGNOSIS — I10 Essential (primary) hypertension: Secondary | ICD-10-CM | POA: Insufficient documentation

## 2023-04-10 DIAGNOSIS — M545 Low back pain, unspecified: Secondary | ICD-10-CM | POA: Diagnosis not present

## 2023-04-10 DIAGNOSIS — M5459 Other low back pain: Secondary | ICD-10-CM | POA: Diagnosis not present

## 2023-04-10 DIAGNOSIS — M549 Dorsalgia, unspecified: Secondary | ICD-10-CM | POA: Diagnosis not present

## 2023-04-10 DIAGNOSIS — Z743 Need for continuous supervision: Secondary | ICD-10-CM | POA: Diagnosis not present

## 2023-04-10 MED ORDER — HYDROMORPHONE HCL 1 MG/ML IJ SOLN
1.0000 mg | Freq: Once | INTRAMUSCULAR | Status: AC
  Administered 2023-04-10: 1 mg via INTRAMUSCULAR
  Filled 2023-04-10: qty 1

## 2023-04-10 NOTE — ED Triage Notes (Signed)
EMS brins pt in from home for c/o back pain for several days; taking hydrocodone without relief; hx ruptured disc

## 2023-04-10 NOTE — ED Triage Notes (Signed)
Pt reports lower back pain, pt had MRI 3 weeks ago and told he had some ruptured discs. Pt taking hydrocodone at home with no relief.

## 2023-04-10 NOTE — Discharge Instructions (Signed)
As we discussed please follow-up with your pain management physician today to discuss your ER visit as well as worsening back pain.  Return to the emergency department for any symptom personally concerning to yourself.

## 2023-04-10 NOTE — ED Provider Notes (Signed)
[facility] Provider Note    Event Date/Time   First MD Initiated Contact with Patient 04/10/23 0138     (approximate)  History   Chief Complaint: Back Pain  HPI  Mark Klutz. is a 88 y.o. male with a past history of spondylosis, hypertension, hyperlipidemia, chronic lower back pain, presents to the emergency department for worsening lower back pain.  Patient states a long history of lower back pain that has been progressively worsening.  Patient states the pain medication that he is taking at home (Norco 3 times daily) is not enough and he has been having increased pain was not able to sleep tonight so he came to the emergency department for evaluation.  No significant changes such as no weakness no incontinence.  Physical Exam   Triage Vital Signs: ED Triage Vitals  Encounter Vitals Group     BP 04/10/23 0114 (!) 148/95     Systolic BP Percentile --      Diastolic BP Percentile --      Pulse Rate 04/10/23 0114 (!) 117     Resp 04/10/23 0114 19     Temp 04/10/23 0114 98.7 F (37.1 C)     Temp Source 04/10/23 0114 Oral     SpO2 04/10/23 0114 99 %     Weight 04/10/23 0112 207 lb (93.9 kg)     Height 04/10/23 0112 5\' 1"  (1.549 m)     Head Circumference --      Peak Flow --      Pain Score 04/10/23 0112 10     Pain Loc --      Pain Education --      Exclude from Growth Chart --     Most recent vital signs: Vitals:   04/10/23 0114 04/10/23 0215  BP: (!) 148/95   Pulse: (!) 117 (!) 109  Resp: 19   Temp: 98.7 F (37.1 C)   SpO2: 99% 96%    General: Awake, no distress.  CV:  Good peripheral perfusion.  Regular rate and rhythm  Resp:  Normal effort.  Equal breath sounds bilaterally.  Abd:  No distention.   ED Results / Procedures / Treatments   MEDICATIONS ORDERED IN ED: Medications  HYDROmorphone (DILAUDID) injection 1 mg (1 mg Intramuscular Given 04/10/23 0212)  HYDROmorphone (DILAUDID) injection 1 mg (1 mg Intramuscular Given  04/10/23 0317)     IMPRESSION / MDM / ASSESSMENT AND PLAN / ED COURSE  I reviewed the triage vital signs and the nursing notes.  Patient's presentation is most consistent with acute illness / injury with system symptoms.  Patient presents to the emergency department for worsening lower back pain.  Patient has a history of the same I reviewed the patient's notes including from physical medicine recently.  Overall the patient appears well, no red flags on exam.  We will dose Dilaudid intramuscularly to help with the patient's pain.  I discussed with the patient he needs to follow-up with his pain management physician today to discuss his worsening pain as well as ER visit.  Patient states improvement in the pain but still fairly significant we will dose a second dose of pain medication in the emergency department.  Patient states improvement in the pain, although pain is still present.  I discussed with the patient that the pain medication would not get rid of the pain but hopefully make it more manageable so the patient could go home and sleep and he should follow-up with  his pain management physician later today.  Patient agreeable to plan.  FINAL CLINICAL IMPRESSION(S) / ED DIAGNOSES   Acute on chronic back pain   Note:  This document was prepared using Dragon voice recognition software and may include unintentional dictation errors.   Minna Antis, MD 04/10/23 0400

## 2023-04-11 DIAGNOSIS — Z791 Long term (current) use of non-steroidal anti-inflammatories (NSAID): Secondary | ICD-10-CM | POA: Diagnosis not present

## 2023-04-11 DIAGNOSIS — M4807 Spinal stenosis, lumbosacral region: Secondary | ICD-10-CM | POA: Diagnosis not present

## 2023-04-11 DIAGNOSIS — Z79899 Other long term (current) drug therapy: Secondary | ICD-10-CM | POA: Diagnosis not present

## 2023-04-11 DIAGNOSIS — N189 Chronic kidney disease, unspecified: Secondary | ICD-10-CM | POA: Diagnosis not present

## 2023-04-11 DIAGNOSIS — M4802 Spinal stenosis, cervical region: Secondary | ICD-10-CM | POA: Diagnosis not present

## 2023-04-11 DIAGNOSIS — Z604 Social exclusion and rejection: Secondary | ICD-10-CM | POA: Diagnosis not present

## 2023-04-11 DIAGNOSIS — M5412 Radiculopathy, cervical region: Secondary | ICD-10-CM | POA: Diagnosis not present

## 2023-04-11 DIAGNOSIS — E785 Hyperlipidemia, unspecified: Secondary | ICD-10-CM | POA: Diagnosis not present

## 2023-04-11 DIAGNOSIS — Z9181 History of falling: Secondary | ICD-10-CM | POA: Diagnosis not present

## 2023-04-11 DIAGNOSIS — M4726 Other spondylosis with radiculopathy, lumbar region: Secondary | ICD-10-CM | POA: Diagnosis not present

## 2023-04-11 DIAGNOSIS — E039 Hypothyroidism, unspecified: Secondary | ICD-10-CM | POA: Diagnosis not present

## 2023-04-11 DIAGNOSIS — Z993 Dependence on wheelchair: Secondary | ICD-10-CM | POA: Diagnosis not present

## 2023-04-11 DIAGNOSIS — E1122 Type 2 diabetes mellitus with diabetic chronic kidney disease: Secondary | ICD-10-CM | POA: Diagnosis not present

## 2023-04-11 DIAGNOSIS — L0591 Pilonidal cyst without abscess: Secondary | ICD-10-CM | POA: Diagnosis not present

## 2023-04-11 DIAGNOSIS — Z7982 Long term (current) use of aspirin: Secondary | ICD-10-CM | POA: Diagnosis not present

## 2023-04-11 DIAGNOSIS — Z556 Problems related to health literacy: Secondary | ICD-10-CM | POA: Diagnosis not present

## 2023-04-11 DIAGNOSIS — B379 Candidiasis, unspecified: Secondary | ICD-10-CM | POA: Diagnosis not present

## 2023-04-11 DIAGNOSIS — I129 Hypertensive chronic kidney disease with stage 1 through stage 4 chronic kidney disease, or unspecified chronic kidney disease: Secondary | ICD-10-CM | POA: Diagnosis not present

## 2023-04-11 DIAGNOSIS — M5116 Intervertebral disc disorders with radiculopathy, lumbar region: Secondary | ICD-10-CM | POA: Diagnosis not present

## 2023-04-11 DIAGNOSIS — Z7984 Long term (current) use of oral hypoglycemic drugs: Secondary | ICD-10-CM | POA: Diagnosis not present

## 2023-04-15 DIAGNOSIS — M5412 Radiculopathy, cervical region: Secondary | ICD-10-CM | POA: Diagnosis not present

## 2023-04-15 DIAGNOSIS — M4726 Other spondylosis with radiculopathy, lumbar region: Secondary | ICD-10-CM | POA: Diagnosis not present

## 2023-04-15 DIAGNOSIS — Z7982 Long term (current) use of aspirin: Secondary | ICD-10-CM | POA: Diagnosis not present

## 2023-04-15 DIAGNOSIS — M4802 Spinal stenosis, cervical region: Secondary | ICD-10-CM | POA: Diagnosis not present

## 2023-04-15 DIAGNOSIS — Z556 Problems related to health literacy: Secondary | ICD-10-CM | POA: Diagnosis not present

## 2023-04-15 DIAGNOSIS — E1122 Type 2 diabetes mellitus with diabetic chronic kidney disease: Secondary | ICD-10-CM | POA: Diagnosis not present

## 2023-04-15 DIAGNOSIS — B379 Candidiasis, unspecified: Secondary | ICD-10-CM | POA: Diagnosis not present

## 2023-04-15 DIAGNOSIS — M5126 Other intervertebral disc displacement, lumbar region: Secondary | ICD-10-CM | POA: Diagnosis not present

## 2023-04-15 DIAGNOSIS — Z7984 Long term (current) use of oral hypoglycemic drugs: Secondary | ICD-10-CM | POA: Diagnosis not present

## 2023-04-15 DIAGNOSIS — Z79899 Other long term (current) drug therapy: Secondary | ICD-10-CM | POA: Diagnosis not present

## 2023-04-15 DIAGNOSIS — N189 Chronic kidney disease, unspecified: Secondary | ICD-10-CM | POA: Diagnosis not present

## 2023-04-15 DIAGNOSIS — M5116 Intervertebral disc disorders with radiculopathy, lumbar region: Secondary | ICD-10-CM | POA: Diagnosis not present

## 2023-04-15 DIAGNOSIS — Z791 Long term (current) use of non-steroidal anti-inflammatories (NSAID): Secondary | ICD-10-CM | POA: Diagnosis not present

## 2023-04-15 DIAGNOSIS — Z9181 History of falling: Secondary | ICD-10-CM | POA: Diagnosis not present

## 2023-04-15 DIAGNOSIS — Z993 Dependence on wheelchair: Secondary | ICD-10-CM | POA: Diagnosis not present

## 2023-04-15 DIAGNOSIS — M48062 Spinal stenosis, lumbar region with neurogenic claudication: Secondary | ICD-10-CM | POA: Diagnosis not present

## 2023-04-15 DIAGNOSIS — M5416 Radiculopathy, lumbar region: Secondary | ICD-10-CM | POA: Diagnosis not present

## 2023-04-15 DIAGNOSIS — E785 Hyperlipidemia, unspecified: Secondary | ICD-10-CM | POA: Diagnosis not present

## 2023-04-15 DIAGNOSIS — L0591 Pilonidal cyst without abscess: Secondary | ICD-10-CM | POA: Diagnosis not present

## 2023-04-15 DIAGNOSIS — Z604 Social exclusion and rejection: Secondary | ICD-10-CM | POA: Diagnosis not present

## 2023-04-15 DIAGNOSIS — M7918 Myalgia, other site: Secondary | ICD-10-CM | POA: Diagnosis not present

## 2023-04-15 DIAGNOSIS — M51362 Other intervertebral disc degeneration, lumbar region with discogenic back pain and lower extremity pain: Secondary | ICD-10-CM | POA: Diagnosis not present

## 2023-04-15 DIAGNOSIS — I129 Hypertensive chronic kidney disease with stage 1 through stage 4 chronic kidney disease, or unspecified chronic kidney disease: Secondary | ICD-10-CM | POA: Diagnosis not present

## 2023-04-15 DIAGNOSIS — M4807 Spinal stenosis, lumbosacral region: Secondary | ICD-10-CM | POA: Diagnosis not present

## 2023-04-15 DIAGNOSIS — E039 Hypothyroidism, unspecified: Secondary | ICD-10-CM | POA: Diagnosis not present

## 2023-04-16 DIAGNOSIS — B379 Candidiasis, unspecified: Secondary | ICD-10-CM | POA: Diagnosis not present

## 2023-04-16 DIAGNOSIS — M4726 Other spondylosis with radiculopathy, lumbar region: Secondary | ICD-10-CM | POA: Diagnosis not present

## 2023-04-16 DIAGNOSIS — M5412 Radiculopathy, cervical region: Secondary | ICD-10-CM | POA: Diagnosis not present

## 2023-04-16 DIAGNOSIS — E785 Hyperlipidemia, unspecified: Secondary | ICD-10-CM | POA: Diagnosis not present

## 2023-04-16 DIAGNOSIS — Z7984 Long term (current) use of oral hypoglycemic drugs: Secondary | ICD-10-CM | POA: Diagnosis not present

## 2023-04-16 DIAGNOSIS — Z556 Problems related to health literacy: Secondary | ICD-10-CM | POA: Diagnosis not present

## 2023-04-16 DIAGNOSIS — N189 Chronic kidney disease, unspecified: Secondary | ICD-10-CM | POA: Diagnosis not present

## 2023-04-16 DIAGNOSIS — Z604 Social exclusion and rejection: Secondary | ICD-10-CM | POA: Diagnosis not present

## 2023-04-16 DIAGNOSIS — M5116 Intervertebral disc disorders with radiculopathy, lumbar region: Secondary | ICD-10-CM | POA: Diagnosis not present

## 2023-04-16 DIAGNOSIS — L0591 Pilonidal cyst without abscess: Secondary | ICD-10-CM | POA: Diagnosis not present

## 2023-04-16 DIAGNOSIS — Z9181 History of falling: Secondary | ICD-10-CM | POA: Diagnosis not present

## 2023-04-16 DIAGNOSIS — Z993 Dependence on wheelchair: Secondary | ICD-10-CM | POA: Diagnosis not present

## 2023-04-16 DIAGNOSIS — Z7982 Long term (current) use of aspirin: Secondary | ICD-10-CM | POA: Diagnosis not present

## 2023-04-16 DIAGNOSIS — Z79899 Other long term (current) drug therapy: Secondary | ICD-10-CM | POA: Diagnosis not present

## 2023-04-16 DIAGNOSIS — M4807 Spinal stenosis, lumbosacral region: Secondary | ICD-10-CM | POA: Diagnosis not present

## 2023-04-16 DIAGNOSIS — E1122 Type 2 diabetes mellitus with diabetic chronic kidney disease: Secondary | ICD-10-CM | POA: Diagnosis not present

## 2023-04-16 DIAGNOSIS — E039 Hypothyroidism, unspecified: Secondary | ICD-10-CM | POA: Diagnosis not present

## 2023-04-16 DIAGNOSIS — Z791 Long term (current) use of non-steroidal anti-inflammatories (NSAID): Secondary | ICD-10-CM | POA: Diagnosis not present

## 2023-04-16 DIAGNOSIS — M4802 Spinal stenosis, cervical region: Secondary | ICD-10-CM | POA: Diagnosis not present

## 2023-04-16 DIAGNOSIS — I129 Hypertensive chronic kidney disease with stage 1 through stage 4 chronic kidney disease, or unspecified chronic kidney disease: Secondary | ICD-10-CM | POA: Diagnosis not present

## 2023-04-17 DIAGNOSIS — Z79899 Other long term (current) drug therapy: Secondary | ICD-10-CM | POA: Diagnosis not present

## 2023-04-17 DIAGNOSIS — I129 Hypertensive chronic kidney disease with stage 1 through stage 4 chronic kidney disease, or unspecified chronic kidney disease: Secondary | ICD-10-CM | POA: Diagnosis not present

## 2023-04-17 DIAGNOSIS — M4802 Spinal stenosis, cervical region: Secondary | ICD-10-CM | POA: Diagnosis not present

## 2023-04-17 DIAGNOSIS — L0591 Pilonidal cyst without abscess: Secondary | ICD-10-CM | POA: Diagnosis not present

## 2023-04-17 DIAGNOSIS — E039 Hypothyroidism, unspecified: Secondary | ICD-10-CM | POA: Diagnosis not present

## 2023-04-17 DIAGNOSIS — M5412 Radiculopathy, cervical region: Secondary | ICD-10-CM | POA: Diagnosis not present

## 2023-04-17 DIAGNOSIS — Z7982 Long term (current) use of aspirin: Secondary | ICD-10-CM | POA: Diagnosis not present

## 2023-04-17 DIAGNOSIS — Z993 Dependence on wheelchair: Secondary | ICD-10-CM | POA: Diagnosis not present

## 2023-04-17 DIAGNOSIS — Z7984 Long term (current) use of oral hypoglycemic drugs: Secondary | ICD-10-CM | POA: Diagnosis not present

## 2023-04-17 DIAGNOSIS — M4726 Other spondylosis with radiculopathy, lumbar region: Secondary | ICD-10-CM | POA: Diagnosis not present

## 2023-04-17 DIAGNOSIS — M5116 Intervertebral disc disorders with radiculopathy, lumbar region: Secondary | ICD-10-CM | POA: Diagnosis not present

## 2023-04-17 DIAGNOSIS — B379 Candidiasis, unspecified: Secondary | ICD-10-CM | POA: Diagnosis not present

## 2023-04-17 DIAGNOSIS — Z9181 History of falling: Secondary | ICD-10-CM | POA: Diagnosis not present

## 2023-04-17 DIAGNOSIS — M4807 Spinal stenosis, lumbosacral region: Secondary | ICD-10-CM | POA: Diagnosis not present

## 2023-04-17 DIAGNOSIS — Z604 Social exclusion and rejection: Secondary | ICD-10-CM | POA: Diagnosis not present

## 2023-04-17 DIAGNOSIS — Z556 Problems related to health literacy: Secondary | ICD-10-CM | POA: Diagnosis not present

## 2023-04-17 DIAGNOSIS — N189 Chronic kidney disease, unspecified: Secondary | ICD-10-CM | POA: Diagnosis not present

## 2023-04-17 DIAGNOSIS — Z791 Long term (current) use of non-steroidal anti-inflammatories (NSAID): Secondary | ICD-10-CM | POA: Diagnosis not present

## 2023-04-17 DIAGNOSIS — E785 Hyperlipidemia, unspecified: Secondary | ICD-10-CM | POA: Diagnosis not present

## 2023-04-17 DIAGNOSIS — E1122 Type 2 diabetes mellitus with diabetic chronic kidney disease: Secondary | ICD-10-CM | POA: Diagnosis not present

## 2023-04-22 ENCOUNTER — Other Ambulatory Visit: Payer: Self-pay | Admitting: Family Medicine

## 2023-04-22 DIAGNOSIS — M4807 Spinal stenosis, lumbosacral region: Secondary | ICD-10-CM | POA: Diagnosis not present

## 2023-04-22 DIAGNOSIS — Z9181 History of falling: Secondary | ICD-10-CM | POA: Diagnosis not present

## 2023-04-22 DIAGNOSIS — B379 Candidiasis, unspecified: Secondary | ICD-10-CM | POA: Diagnosis not present

## 2023-04-22 DIAGNOSIS — M5116 Intervertebral disc disorders with radiculopathy, lumbar region: Secondary | ICD-10-CM | POA: Diagnosis not present

## 2023-04-22 DIAGNOSIS — L0591 Pilonidal cyst without abscess: Secondary | ICD-10-CM | POA: Diagnosis not present

## 2023-04-22 DIAGNOSIS — Z7982 Long term (current) use of aspirin: Secondary | ICD-10-CM | POA: Diagnosis not present

## 2023-04-22 DIAGNOSIS — Z993 Dependence on wheelchair: Secondary | ICD-10-CM | POA: Diagnosis not present

## 2023-04-22 DIAGNOSIS — Z604 Social exclusion and rejection: Secondary | ICD-10-CM | POA: Diagnosis not present

## 2023-04-22 DIAGNOSIS — M4802 Spinal stenosis, cervical region: Secondary | ICD-10-CM | POA: Diagnosis not present

## 2023-04-22 DIAGNOSIS — E1121 Type 2 diabetes mellitus with diabetic nephropathy: Secondary | ICD-10-CM

## 2023-04-22 DIAGNOSIS — E1122 Type 2 diabetes mellitus with diabetic chronic kidney disease: Secondary | ICD-10-CM | POA: Diagnosis not present

## 2023-04-22 DIAGNOSIS — Z791 Long term (current) use of non-steroidal anti-inflammatories (NSAID): Secondary | ICD-10-CM | POA: Diagnosis not present

## 2023-04-22 DIAGNOSIS — Z79899 Other long term (current) drug therapy: Secondary | ICD-10-CM | POA: Diagnosis not present

## 2023-04-22 DIAGNOSIS — Z556 Problems related to health literacy: Secondary | ICD-10-CM | POA: Diagnosis not present

## 2023-04-22 DIAGNOSIS — Z7984 Long term (current) use of oral hypoglycemic drugs: Secondary | ICD-10-CM | POA: Diagnosis not present

## 2023-04-22 DIAGNOSIS — E039 Hypothyroidism, unspecified: Secondary | ICD-10-CM | POA: Diagnosis not present

## 2023-04-22 DIAGNOSIS — M5412 Radiculopathy, cervical region: Secondary | ICD-10-CM | POA: Diagnosis not present

## 2023-04-22 DIAGNOSIS — I129 Hypertensive chronic kidney disease with stage 1 through stage 4 chronic kidney disease, or unspecified chronic kidney disease: Secondary | ICD-10-CM | POA: Diagnosis not present

## 2023-04-22 DIAGNOSIS — N189 Chronic kidney disease, unspecified: Secondary | ICD-10-CM | POA: Diagnosis not present

## 2023-04-22 DIAGNOSIS — E785 Hyperlipidemia, unspecified: Secondary | ICD-10-CM | POA: Diagnosis not present

## 2023-04-22 DIAGNOSIS — M4726 Other spondylosis with radiculopathy, lumbar region: Secondary | ICD-10-CM | POA: Diagnosis not present

## 2023-04-23 NOTE — Telephone Encounter (Signed)
Requested Prescriptions  Pending Prescriptions Disp Refills   glimepiride (AMARYL) 1 MG tablet [Pharmacy Med Name: Glimepiride 1 MG Oral Tablet] 90 tablet 1    Sig: TAKE 1 TABLET BY MOUTH DAILY     Endocrinology:  Diabetes - Sulfonylureas Failed - 04/22/2023 10:20 PM      Failed - HBA1C is between 0 and 7.9 and within 180 days    Hemoglobin A1C  Date Value Ref Range Status  03/04/2023 8.2 (A) 4.0 - 5.6 % Final   Hgb A1c MFr Bld  Date Value Ref Range Status  10/25/2021 7.1 (H) 4.8 - 5.6 % Final    Comment:             Prediabetes: 5.7 - 6.4          Diabetes: >6.4          Glycemic control for adults with diabetes: <7.0          Failed - Cr in normal range and within 360 days    Creat  Date Value Ref Range Status  05/13/2017 1.70 (H) 0.70 - 1.11 mg/dL Final    Comment:    For patients >29 years of age, the reference limit for Creatinine is approximately 13% higher for people identified as African-American. .    Creatinine, Ser  Date Value Ref Range Status  03/04/2023 1.85 (H) 0.76 - 1.27 mg/dL Final         Passed - Valid encounter within last 6 months    Recent Outpatient Visits           2 weeks ago Dysuria   [facility] Malva Limes, MD   1 month ago Type 2 diabetes mellitus with chronic kidney disease, without long-term current use of insulin, unspecified CKD stage [facility])   [facility] Malva L[facility], MD   5 months ago Type 2 diabetes mellitus with chronic kidney disease, without long-term current use of insulin, unspecified CKD stage [facility])   [facility], MD   8 months ago Type 2 diabetes mellitus with chronic kidney disease, without long-term current use of insulin, unspecified CKD stage (HCC)   [facility] Mal[facility] Limes, MD   1 year ago Diabetes mellitus with nephropathy [facility])   [place] [facility] Malva Limes, MD       Future Appointments             In 1 week Fisher, Demetrios Isaacs, MD [facility]             potassium chloride SA (KLOR-CON M) 20 MEQ tablet [Pharmacy Med Name: Potassium Chloride Crys ER 20 MEQ Oral Tablet Extended Release] 90 tablet 3    Sig: TAKE 1 TABLET BY MOUTH DAILY     Endocrinology:  Minerals - Potassium Supplementation Failed - 04/22/2023 10:20 PM      Failed - Cr in normal range and within 360 days    Creat  Date Value Ref Range Status  05/13/2017 1.70 (H) 0.70 - 1.11 mg/dL Final    Comment:    For patients >44 years of age, the reference limit for Creatinine is approximately 13% higher for people identified as African-American. .    Creatinine, Ser  Date Value Ref Range Status  03/04/2023 1.85 (H) 0.76 - 1.27 mg/dL Final         Passed - K in normal  range and within 360 days    Potassium  Date Value Ref Range Status  03/04/2023 4.6 3.5 - 5.2 mmol/L Final         Passed - Valid encounter within last 12 months    Recent Outpatient Visits           2 weeks ago Dysuria   [facility] Mal[facility], MD   1 month ago Type 2 diabetes mellitus with chronic kidney disease, without long-term current use of insulin, unspecified CKD stage (HCC)   [facility] Malva Limes, MD   5 months ago Type 2 diabetes mellitus with chronic kidney disease, without long-term current use of insulin, unspecified CKD stage (HCC)   [facility] Malva Limes, MD   8 months ago Type 2 diabetes mellitus with chronic kidney disease, without long-term current use of insulin, unspecified CKD stage (HCC)   [place] [facility] Malva Limes, MD   1 year ago Diabetes mellitus with nephropathy [facility])   [facility] Malva Limes, MD       Future Appointments             In 1 week Fisher, Demetrios Isaacs, MD  [facility]             ACCU-CHEK GUIDE test strip [place] Med Name: Accu-Chek Guide In Vitro Strip] 100 strip 3    Sig: CHECK BLOOD SUGAR ONCE DAILY FOR TYPE 2 DIABETES     Endocrinology: Diabetes - Testing Supplies Passed - 04/22/2023 10:20 PM      Passed - Valid encounter within last 12 months    Recent Outpatient Visits           2 weeks ago Dysuria   [facility] Malva Limes, MD   1 month ago Type 2 diabetes mellitus with chronic kidney disease, without long-term current use of insulin, unspecified CKD stage (HCC)   [place] [facility] Malva Limes, MD   5 months ago Type 2 diabetes mellitus with chronic kidney disease, without long-term current use of insulin, unspecified CKD stage [facility])   [facility] Malva Limes, MD   8 months ago Type 2 diabetes mellitus with chronic kidney disease, without long-term current use of insulin, unspecified CKD stage (HCC)   [facility] [facility] Malva Limes, MD   1 year ago Diabetes mellitus with nephropathy [facility])   [facility] Malva Limes, MD       Future Appointments             In 1 week Fisher, Demetrios Isaacs, MD [facility]

## 2023-04-30 DIAGNOSIS — M47816 Spondylosis without myelopathy or radiculopathy, lumbar region: Secondary | ICD-10-CM | POA: Diagnosis not present

## 2023-05-02 DIAGNOSIS — Z79899 Other long term (current) drug therapy: Secondary | ICD-10-CM | POA: Diagnosis not present

## 2023-05-02 DIAGNOSIS — M5412 Radiculopathy, cervical region: Secondary | ICD-10-CM | POA: Diagnosis not present

## 2023-05-02 DIAGNOSIS — Z791 Long term (current) use of non-steroidal anti-inflammatories (NSAID): Secondary | ICD-10-CM | POA: Diagnosis not present

## 2023-05-02 DIAGNOSIS — Z993 Dependence on wheelchair: Secondary | ICD-10-CM | POA: Diagnosis not present

## 2023-05-02 DIAGNOSIS — Z7982 Long term (current) use of aspirin: Secondary | ICD-10-CM | POA: Diagnosis not present

## 2023-05-02 DIAGNOSIS — M4807 Spinal stenosis, lumbosacral region: Secondary | ICD-10-CM | POA: Diagnosis not present

## 2023-05-02 DIAGNOSIS — M4802 Spinal stenosis, cervical region: Secondary | ICD-10-CM | POA: Diagnosis not present

## 2023-05-02 DIAGNOSIS — Z604 Social exclusion and rejection: Secondary | ICD-10-CM | POA: Diagnosis not present

## 2023-05-02 DIAGNOSIS — Z556 Problems related to health literacy: Secondary | ICD-10-CM | POA: Diagnosis not present

## 2023-05-02 DIAGNOSIS — I129 Hypertensive chronic kidney disease with stage 1 through stage 4 chronic kidney disease, or unspecified chronic kidney disease: Secondary | ICD-10-CM | POA: Diagnosis not present

## 2023-05-02 DIAGNOSIS — M5116 Intervertebral disc disorders with radiculopathy, lumbar region: Secondary | ICD-10-CM | POA: Diagnosis not present

## 2023-05-02 DIAGNOSIS — L0591 Pilonidal cyst without abscess: Secondary | ICD-10-CM | POA: Diagnosis not present

## 2023-05-02 DIAGNOSIS — E1122 Type 2 diabetes mellitus with diabetic chronic kidney disease: Secondary | ICD-10-CM | POA: Diagnosis not present

## 2023-05-02 DIAGNOSIS — Z9181 History of falling: Secondary | ICD-10-CM | POA: Diagnosis not present

## 2023-05-02 DIAGNOSIS — M4726 Other spondylosis with radiculopathy, lumbar region: Secondary | ICD-10-CM | POA: Diagnosis not present

## 2023-05-02 DIAGNOSIS — E039 Hypothyroidism, unspecified: Secondary | ICD-10-CM | POA: Diagnosis not present

## 2023-05-02 DIAGNOSIS — Z7984 Long term (current) use of oral hypoglycemic drugs: Secondary | ICD-10-CM | POA: Diagnosis not present

## 2023-05-02 DIAGNOSIS — B379 Candidiasis, unspecified: Secondary | ICD-10-CM | POA: Diagnosis not present

## 2023-05-02 DIAGNOSIS — E785 Hyperlipidemia, unspecified: Secondary | ICD-10-CM | POA: Diagnosis not present

## 2023-05-02 DIAGNOSIS — N189 Chronic kidney disease, unspecified: Secondary | ICD-10-CM | POA: Diagnosis not present

## 2023-05-04 ENCOUNTER — Other Ambulatory Visit: Payer: Self-pay | Admitting: Family Medicine

## 2023-05-04 DIAGNOSIS — M5116 Intervertebral disc disorders with radiculopathy, lumbar region: Secondary | ICD-10-CM

## 2023-05-06 ENCOUNTER — Ambulatory Visit (INDEPENDENT_AMBULATORY_CARE_PROVIDER_SITE_OTHER): Payer: Medicare Other | Admitting: Family Medicine

## 2023-05-06 ENCOUNTER — Encounter: Payer: Self-pay | Admitting: Family Medicine

## 2023-05-06 VITALS — BP 136/76 | HR 79 | Temp 98.2°F | Ht 61.0 in | Wt 207.0 lb

## 2023-05-06 DIAGNOSIS — L0591 Pilonidal cyst without abscess: Secondary | ICD-10-CM

## 2023-05-06 DIAGNOSIS — R0981 Nasal congestion: Secondary | ICD-10-CM

## 2023-05-06 DIAGNOSIS — M549 Dorsalgia, unspecified: Secondary | ICD-10-CM

## 2023-05-06 DIAGNOSIS — E039 Hypothyroidism, unspecified: Secondary | ICD-10-CM

## 2023-05-06 MED ORDER — CEPHALEXIN 500 MG PO CAPS: 500.0000 mg | ORAL_CAPSULE | Freq: Four times a day (QID) | ORAL | 0 refills | Status: AC

## 2023-05-06 MED ORDER — FLUTICASONE PROPIONATE 50 MCG/ACT NA SUSP: NASAL | 3 refills | Status: DC

## 2023-05-06 NOTE — Patient Instructions (Signed)
.   Please review the attached list of medications and notify my office if there are any errors.   . Please bring all of your medications to every appointment so we can make sure that our medication list is the same as yours.   

## 2023-05-06 NOTE — Progress Notes (Signed)
Established patient visit   Patient: Mark Terry.   DOB: 14-Aug-1934   88 y.o. Male  MRN: [id number] Visit Date: 05/06/2023  Today's healthcare provider: Mila Merry, MD   Chief Complaint  Patient presents with   Hypothyroidism   Back Pain   Subjective    Discussed the use of AI scribe software for clinical note transcription with the patient, who gave verbal consent to proceed.  History of Present Illness   The patient, with a history of chronic back pain and pilonidal cyst, presents for a follow-up visit. He was last seen 2 months ago and TSH was noted to be elevated and had levothyroxine increased to 50 mcg which he has been doing well with. He recently moved to a [facility] for additional support. His back pain is currently manageable as he has recently had ablation by Dr. Yves Dill. Was also started on duloxetine at his last visit here. He reports ongoing issues with his leg, which he describes as sliding when he tries to walk.  The patient also has a pilonidal cyst that has been draining. He was previously told by a surgeon that he would have to live with it. He was prescribed 2 weeks cephalexin at last visit, and he reports drainage stopped while he was taking it but resumed about ten days after he stops taking the medication. The cyst is currently draining again.  He also requests a refill of his fluticasone nasal spray, which he gets from an Therapist, occupational. He expresses some frustration with having to depend on others for help, but also expresses gratitude for the assistance he receives.  Finally, he mentions a sore spot between his legs that has been present for about three weeks. He has been applying Neosporin to it, but it has not healed.       Medications: Outpatient Medications Prior to Visit  Medication Sig   Accu-Chek FastClix Lancets MISC USE TO CHECK BLOOD GLUCOSE AS  DIRECTED   ACCU-CHEK GUIDE test strip CHECK BLOOD SUGAR ONCE DAILY FOR TYPE 2  DIABETES   aspirin 81 MG tablet Take 81 mg by mouth daily.   Blood Glucose Monitoring Suppl (ACCU-CHEK GUIDE) w/Device KIT USE AS DIRECTED DAILY   doxazosin (CARDURA) 2 MG tablet TAKE 1 TABLET BY MOUTH  DAILY   DULoxetine (CYMBALTA) 30 MG capsule Take 1 capsule (30 mg total) by mouth daily.   furosemide (LASIX) 40 MG tablet TAKE 1 TABLET BY MOUTH DAILY   gabapentin (NEURONTIN) 300 MG capsule TAKE 1 CAPSULE BY MOUTH 3 TIMES  DAILY   glimepiride (AMARYL) 1 MG tablet TAKE 1 TABLET BY MOUTH DAILY   HYDROcodone-acetaminophen (NORCO/VICODIN) 5-325 MG tablet TAKE 1 TABLET BY MOUTH TWICE DAILY AS NEEDED   ibuprofen (ADVIL,MOTRIN) 200 MG tablet Take 200 mg by mouth every 6 (six) hours as needed.   JARDIANCE 25 MG TABS tablet TAKE 1 TABLET BY MOUTH  DAILY   Lancets Misc. (ACCU-CHEK FASTCLIX LANCET) KIT Used to check glucose.  DX E11.9   levothyroxine (SYNTHROID) 50 MCG tablet Take 1 tablet (50 mcg total) by mouth daily.   loratadine (CLARITIN) 10 MG tablet Take 10 mg by mouth daily.   losartan (COZAAR) 50 MG tablet TAKE 1 TABLET BY MOUTH DAILY   meloxicam (MOBIC) 15 MG tablet Take 15 mg by mouth daily.   metFORMIN (GLUCOPHAGE-XR) 500 MG 24 hr tablet TAKE 1 TABLET BY MOUTH TWICE  DAILY   metoprolol tartrate (LOPRESSOR) 25 MG tablet TAKE  1 TABLET BY MOUTH TWICE  DAILY   Multiple Vitamin (MULTIVITAMIN WITH MINERALS) TABS Take 1 tablet by mouth daily.   omeprazole (PRILOSEC) 40 MG capsule TAKE 1 CAPSULE BY MOUTH DAILY   oxybutynin (DITROPAN) 5 MG tablet TAKE 1 TABLET BY MOUTH TWICE  DAILY   potassium chloride SA (KLOR-CON M) 20 MEQ tablet TAKE 1 TABLET BY MOUTH DAILY   simvastatin (ZOCOR) 40 MG tablet TAKE 1 TABLET BY MOUTH AT  BEDTIME   traZODone (DESYREL) 100 MG tablet TAKE 1/2 TO 1 TABLET BY MOUTH AT BEDTIME AS NEEDED FOR SLEEP   Vitamin D, Ergocalciferol, (DRISDOL) 1.25 MG (50000 UNIT) CAPS capsule Take 50,000 Units by mouth every 7 (seven) days.   fluticasone (FLONASE) 50 MCG/ACT nasal spray USE 1 TO  2 SPRAYS IN BOTH  NOSTRILS DAILY   No facility-administered medications prior to visit.   Review of Systems     Objective    BP 136/76   Pulse 79   Temp 98.2 F (36.8 C)   Ht 5\' 1"  (1.549 m)   Wt 207 lb (93.9 kg) Comment: unable to stand  SpO2 96%   BMI 39.11 kg/m   Physical Exam  General appearance: Obese male, cooperative and in no acute distress Head: Normocephalic, without obvious abnormality, atraumatic Respiratory: Respirations even and unlabored, normal respiratory rate Extremities: All extremities are intact.  Skin: Mildly inflames in right inguinal area, no open wounds or drainage.  Psych: Appropriate mood and affect. Neurologic: Mental status: Alert, oriented to person, place, and time, thought content appropriate.   Assessment & Plan        Chronic Back Pain Pain is manageable. Patient has had nerve burn on one side and is scheduled for physical therapy to improve leg strength and mobility. Discussed the complexity of the patient's back issues and the limitations of surgical intervention. -Continue current pain management plan. -Start physical therapy as scheduled at [facility] -Continue duloxetin  Pilonidal cyst Recurrent that improves with antibiotics but recurs after cessation of treatment. Currently draining. -Prescribe a month's course of antibiotics.  Intertrigo can use OTC A&D ointment while on abx. consider nystatin cream if doesn't resolve with barrier ointment  Hypothyroidism Medication was increased in August. No current symptoms reported. -Order thyroid function tests to assess current thyroid status.  Nasal Congestion Patient requests refill of Fluticasone nasal spray. -Send prescription for Fluticasone to [facility].  Follow-up in December to check on diabetes management.         Mila Merry, MD  [facility] [phone] (phone) [phone] (fax)  [facility]

## 2023-05-07 DIAGNOSIS — R2689 Other abnormalities of gait and mobility: Secondary | ICD-10-CM | POA: Diagnosis not present

## 2023-05-07 DIAGNOSIS — R2681 Unsteadiness on feet: Secondary | ICD-10-CM | POA: Diagnosis not present

## 2023-05-07 DIAGNOSIS — M158 Other polyosteoarthritis: Secondary | ICD-10-CM | POA: Diagnosis not present

## 2023-05-07 DIAGNOSIS — R296 Repeated falls: Secondary | ICD-10-CM | POA: Diagnosis not present

## 2023-05-07 LAB — TSH: TSH: 1.89 u[IU]/mL (ref 0.450–4.500)

## 2023-05-07 LAB — T4, FREE: Free T4: 1.38 ng/dL (ref 0.82–1.77)

## 2023-05-13 DIAGNOSIS — E119 Type 2 diabetes mellitus without complications: Secondary | ICD-10-CM | POA: Diagnosis not present

## 2023-05-13 DIAGNOSIS — B351 Tinea unguium: Secondary | ICD-10-CM | POA: Diagnosis not present

## 2023-05-18 DIAGNOSIS — M6281 Muscle weakness (generalized): Secondary | ICD-10-CM | POA: Diagnosis not present

## 2023-05-18 DIAGNOSIS — M159 Polyosteoarthritis, unspecified: Secondary | ICD-10-CM | POA: Diagnosis not present

## 2023-05-20 DIAGNOSIS — R296 Repeated falls: Secondary | ICD-10-CM | POA: Diagnosis not present

## 2023-05-20 DIAGNOSIS — R2681 Unsteadiness on feet: Secondary | ICD-10-CM | POA: Diagnosis not present

## 2023-05-20 DIAGNOSIS — R2689 Other abnormalities of gait and mobility: Secondary | ICD-10-CM | POA: Diagnosis not present

## 2023-05-20 DIAGNOSIS — M158 Other polyosteoarthritis: Secondary | ICD-10-CM | POA: Diagnosis not present

## 2023-05-21 DIAGNOSIS — R2681 Unsteadiness on feet: Secondary | ICD-10-CM | POA: Diagnosis not present

## 2023-05-21 DIAGNOSIS — R296 Repeated falls: Secondary | ICD-10-CM | POA: Diagnosis not present

## 2023-05-21 DIAGNOSIS — M158 Other polyosteoarthritis: Secondary | ICD-10-CM | POA: Diagnosis not present

## 2023-05-21 DIAGNOSIS — R2689 Other abnormalities of gait and mobility: Secondary | ICD-10-CM | POA: Diagnosis not present

## 2023-05-23 ENCOUNTER — Telehealth: Payer: Self-pay | Admitting: Family Medicine

## 2023-05-23 NOTE — Telephone Encounter (Addendum)
Medication Refill -  Most Recent Primary Care Visit:  Provider: Malva Limes  Department: [facility]  Visit Type: OFFICE VISIT  Date: 05/06/2023  Medication:  JARDIANCE 25 MG TABS tablet   Has the patient contacted their pharmacy? Yes  Is this the correct pharmacy for this prescription? Yes If no, delete pharmacy and type the correct one.  This is the patient's preferred pharmacy: yes  Has the prescription been filled recently? No  Is the patient out of the medication? Yes  Has the patient been seen for an appointment in the last year OR does the patient have an upcoming appointment? Yes  Can we respond through MyChart? No  Pt is in the "coverage gap" and needs a 45 day supply to ge him through to the 1st of the year.  The Rx is currently tied up in [place], but they would like it sent to [facility] - [place], [place] - [address] .  Hopefully [facility] can help w/ a discount card.  If not, the Rx will cost him $183.00 for the remaining days.  Pt cannot afford that price right now. Daughter Misty Stanley would like you to call her.   Does the dr have any samples? Can the Rx be changed to a new medication?

## 2023-05-24 NOTE — Telephone Encounter (Signed)
He can have 2 boxes of the 25mg  tablets. Copay cards cannot be used with [employer].

## 2023-05-27 DIAGNOSIS — M158 Other polyosteoarthritis: Secondary | ICD-10-CM | POA: Diagnosis not present

## 2023-05-27 DIAGNOSIS — R2689 Other abnormalities of gait and mobility: Secondary | ICD-10-CM | POA: Diagnosis not present

## 2023-05-27 DIAGNOSIS — R296 Repeated falls: Secondary | ICD-10-CM | POA: Diagnosis not present

## 2023-05-27 DIAGNOSIS — R2681 Unsteadiness on feet: Secondary | ICD-10-CM | POA: Diagnosis not present

## 2023-05-27 NOTE — Telephone Encounter (Signed)
Please advise we have no samples.

## 2023-05-28 DIAGNOSIS — R2681 Unsteadiness on feet: Secondary | ICD-10-CM | POA: Diagnosis not present

## 2023-05-28 DIAGNOSIS — M159 Polyosteoarthritis, unspecified: Secondary | ICD-10-CM | POA: Diagnosis not present

## 2023-05-28 DIAGNOSIS — R296 Repeated falls: Secondary | ICD-10-CM | POA: Diagnosis not present

## 2023-05-28 DIAGNOSIS — M6281 Muscle weakness (generalized): Secondary | ICD-10-CM | POA: Diagnosis not present

## 2023-05-28 DIAGNOSIS — R2689 Other abnormalities of gait and mobility: Secondary | ICD-10-CM | POA: Diagnosis not present

## 2023-05-28 DIAGNOSIS — M158 Other polyosteoarthritis: Secondary | ICD-10-CM | POA: Diagnosis not present

## 2023-05-29 DIAGNOSIS — R296 Repeated falls: Secondary | ICD-10-CM | POA: Diagnosis not present

## 2023-05-29 DIAGNOSIS — R2681 Unsteadiness on feet: Secondary | ICD-10-CM | POA: Diagnosis not present

## 2023-05-29 DIAGNOSIS — R2689 Other abnormalities of gait and mobility: Secondary | ICD-10-CM | POA: Diagnosis not present

## 2023-05-29 DIAGNOSIS — M158 Other polyosteoarthritis: Secondary | ICD-10-CM | POA: Diagnosis not present

## 2023-05-30 DIAGNOSIS — M6281 Muscle weakness (generalized): Secondary | ICD-10-CM | POA: Diagnosis not present

## 2023-05-30 DIAGNOSIS — M159 Polyosteoarthritis, unspecified: Secondary | ICD-10-CM | POA: Diagnosis not present

## 2023-06-03 DIAGNOSIS — R296 Repeated falls: Secondary | ICD-10-CM | POA: Diagnosis not present

## 2023-06-03 DIAGNOSIS — M158 Other polyosteoarthritis: Secondary | ICD-10-CM | POA: Diagnosis not present

## 2023-06-03 DIAGNOSIS — R2681 Unsteadiness on feet: Secondary | ICD-10-CM | POA: Diagnosis not present

## 2023-06-03 DIAGNOSIS — R2689 Other abnormalities of gait and mobility: Secondary | ICD-10-CM | POA: Diagnosis not present

## 2023-06-04 DIAGNOSIS — M6281 Muscle weakness (generalized): Secondary | ICD-10-CM | POA: Diagnosis not present

## 2023-06-04 DIAGNOSIS — R296 Repeated falls: Secondary | ICD-10-CM | POA: Diagnosis not present

## 2023-06-04 DIAGNOSIS — M158 Other polyosteoarthritis: Secondary | ICD-10-CM | POA: Diagnosis not present

## 2023-06-04 DIAGNOSIS — R2689 Other abnormalities of gait and mobility: Secondary | ICD-10-CM | POA: Diagnosis not present

## 2023-06-04 DIAGNOSIS — M159 Polyosteoarthritis, unspecified: Secondary | ICD-10-CM | POA: Diagnosis not present

## 2023-06-04 DIAGNOSIS — R2681 Unsteadiness on feet: Secondary | ICD-10-CM | POA: Diagnosis not present

## 2023-06-05 DIAGNOSIS — R296 Repeated falls: Secondary | ICD-10-CM | POA: Diagnosis not present

## 2023-06-05 DIAGNOSIS — M158 Other polyosteoarthritis: Secondary | ICD-10-CM | POA: Diagnosis not present

## 2023-06-05 DIAGNOSIS — R2681 Unsteadiness on feet: Secondary | ICD-10-CM | POA: Diagnosis not present

## 2023-06-05 DIAGNOSIS — R2689 Other abnormalities of gait and mobility: Secondary | ICD-10-CM | POA: Diagnosis not present

## 2023-06-07 DIAGNOSIS — M159 Polyosteoarthritis, unspecified: Secondary | ICD-10-CM | POA: Diagnosis not present

## 2023-06-07 DIAGNOSIS — M6281 Muscle weakness (generalized): Secondary | ICD-10-CM | POA: Diagnosis not present

## 2023-06-10 DIAGNOSIS — R296 Repeated falls: Secondary | ICD-10-CM | POA: Diagnosis not present

## 2023-06-10 DIAGNOSIS — R2681 Unsteadiness on feet: Secondary | ICD-10-CM | POA: Diagnosis not present

## 2023-06-10 DIAGNOSIS — M158 Other polyosteoarthritis: Secondary | ICD-10-CM | POA: Diagnosis not present

## 2023-06-10 DIAGNOSIS — M159 Polyosteoarthritis, unspecified: Secondary | ICD-10-CM | POA: Diagnosis not present

## 2023-06-10 DIAGNOSIS — R2689 Other abnormalities of gait and mobility: Secondary | ICD-10-CM | POA: Diagnosis not present

## 2023-06-10 DIAGNOSIS — M6281 Muscle weakness (generalized): Secondary | ICD-10-CM | POA: Diagnosis not present

## 2023-06-11 DIAGNOSIS — R296 Repeated falls: Secondary | ICD-10-CM | POA: Diagnosis not present

## 2023-06-11 DIAGNOSIS — R2689 Other abnormalities of gait and mobility: Secondary | ICD-10-CM | POA: Diagnosis not present

## 2023-06-11 DIAGNOSIS — M158 Other polyosteoarthritis: Secondary | ICD-10-CM | POA: Diagnosis not present

## 2023-06-11 DIAGNOSIS — R2681 Unsteadiness on feet: Secondary | ICD-10-CM | POA: Diagnosis not present

## 2023-06-12 DIAGNOSIS — R2689 Other abnormalities of gait and mobility: Secondary | ICD-10-CM | POA: Diagnosis not present

## 2023-06-12 DIAGNOSIS — M158 Other polyosteoarthritis: Secondary | ICD-10-CM | POA: Diagnosis not present

## 2023-06-12 DIAGNOSIS — R296 Repeated falls: Secondary | ICD-10-CM | POA: Diagnosis not present

## 2023-06-12 DIAGNOSIS — R2681 Unsteadiness on feet: Secondary | ICD-10-CM | POA: Diagnosis not present

## 2023-06-13 DIAGNOSIS — M5380 Other specified dorsopathies, site unspecified: Secondary | ICD-10-CM | POA: Diagnosis not present

## 2023-06-13 DIAGNOSIS — M48062 Spinal stenosis, lumbar region with neurogenic claudication: Secondary | ICD-10-CM | POA: Diagnosis not present

## 2023-06-13 DIAGNOSIS — M62830 Muscle spasm of back: Secondary | ICD-10-CM | POA: Diagnosis not present

## 2023-06-13 DIAGNOSIS — M5126 Other intervertebral disc displacement, lumbar region: Secondary | ICD-10-CM | POA: Diagnosis not present

## 2023-06-13 DIAGNOSIS — M5416 Radiculopathy, lumbar region: Secondary | ICD-10-CM | POA: Diagnosis not present

## 2023-06-14 DIAGNOSIS — M6281 Muscle weakness (generalized): Secondary | ICD-10-CM | POA: Diagnosis not present

## 2023-06-14 DIAGNOSIS — M159 Polyosteoarthritis, unspecified: Secondary | ICD-10-CM | POA: Diagnosis not present

## 2023-06-17 DIAGNOSIS — R2689 Other abnormalities of gait and mobility: Secondary | ICD-10-CM | POA: Diagnosis not present

## 2023-06-17 DIAGNOSIS — R296 Repeated falls: Secondary | ICD-10-CM | POA: Diagnosis not present

## 2023-06-17 DIAGNOSIS — M158 Other polyosteoarthritis: Secondary | ICD-10-CM | POA: Diagnosis not present

## 2023-06-17 DIAGNOSIS — R2681 Unsteadiness on feet: Secondary | ICD-10-CM | POA: Diagnosis not present

## 2023-06-18 ENCOUNTER — Telehealth: Payer: Self-pay

## 2023-06-18 ENCOUNTER — Ambulatory Visit: Payer: Self-pay

## 2023-06-18 DIAGNOSIS — R296 Repeated falls: Secondary | ICD-10-CM | POA: Diagnosis not present

## 2023-06-18 DIAGNOSIS — M158 Other polyosteoarthritis: Secondary | ICD-10-CM | POA: Diagnosis not present

## 2023-06-18 DIAGNOSIS — M159 Polyosteoarthritis, unspecified: Secondary | ICD-10-CM | POA: Diagnosis not present

## 2023-06-18 DIAGNOSIS — R2689 Other abnormalities of gait and mobility: Secondary | ICD-10-CM | POA: Diagnosis not present

## 2023-06-18 DIAGNOSIS — R2681 Unsteadiness on feet: Secondary | ICD-10-CM | POA: Diagnosis not present

## 2023-06-18 DIAGNOSIS — M6281 Muscle weakness (generalized): Secondary | ICD-10-CM | POA: Diagnosis not present

## 2023-06-18 NOTE — Telephone Encounter (Signed)
Ok, he can have 3 weeks of samples.

## 2023-06-18 NOTE — Telephone Encounter (Signed)
  Chief Complaint: Pt cannot afford copay for Jardiance and has been splitting in 2 for 2-3 weeks now BS is 200's which is outside pt's usual range (100-120's) Asking for samples only.  Symptoms: higher than usual BS no other sx Frequency: 2-3 weeks   Disposition: [] ED /[] Urgent Care (no appt availability in office) / [] Appointment(In office/virtual)/ []  [place] [facility]/ [] Home Care/ [] Refused Recommended Disposition /[]  Mobile Bus/ [x]  Follow-up with PCP Additional Notes: please call pt - requesting samples Reason for Disposition  [1] Prescription refill request for ESSENTIAL medicine (i.e., likelihood of harm to patient if not taken) AND [2] triager unable to refill per department policy  [1] Caller has URGENT medication or insulin pump question AND [2] triager unable to answer question  Answer Assessment - Initial Assessment Questions 1. BLOOD GLUCOSE: "What is your blood glucose level?"      Cutting pills in half - cannot afford copay -normally 140 since pills in half  dropped. 2 -3 weeks  2. ONSET: "When did you check the blood glucose?"     This am  3. USUAL RANGE: "What is your glucose level usually?" (e.g., usual fasting morning value, usual evening value)     Usually 100-120 now in the 200's has been splitting pills 7. DIABETES PILLS: "Do you take any pills for your diabetes?" If Yes, ask: "Have you missed taking any pills recently?"     Jardiance 570 co pay  8. OTHER SYMPTOMS: "Do you have any symptoms?" (e.g., fever, frequent urination, difficulty breathing, dizziness, weakness, vomiting)     none  Answer Assessment - Initial Assessment Questions 1. DRUG NAME: "What medicine do you need to have refilled?"     Jardiance cannot afford copay and has been splitting pills 2. REFILLS REMAINING: "How many refills are remaining?" (Note: The label on the medicine or pill bottle will show how many refills are remaining. If there are no refills remaining, then a renewal  may be needed.)     none 5. SYMPTOMS: "Do you have any symptoms?"     BS in the 200's  Protocols used: Diabetes - High Blood Sugar-A-AH, Medication Refill and Renewal Call-A-AH

## 2023-06-18 NOTE — Telephone Encounter (Addendum)
Trazodone is generic and not samples. I don't know if we have any Jardiance, but he can have 2-3 weeks samples if we have any. If no samples there might be vouchers in the sample closet.

## 2023-06-18 NOTE — Telephone Encounter (Signed)
Copied from CRM [id number]. Topic: General - Other >> Jun 18, 2023  8:46 AM Phill Myron wrote: JARDIANCE 25 MG TABS too expensive,  cost  $570.00.Marland Kitchenand traZODone (DESYREL) 100 MG tablet  lost the bottle.. do you have any samples until next year for both medications.

## 2023-06-19 DIAGNOSIS — M159 Polyosteoarthritis, unspecified: Secondary | ICD-10-CM | POA: Diagnosis not present

## 2023-06-19 DIAGNOSIS — M6281 Muscle weakness (generalized): Secondary | ICD-10-CM | POA: Diagnosis not present

## 2023-06-19 NOTE — Telephone Encounter (Signed)
Pt advised. Verbalized understanding.   No samples of Jardiance 25 mg available. Advised patient we have vouchers available for him to pick up from the front desk. Reported he will have a friend come and pick them up as he is not able too

## 2023-06-19 NOTE — Telephone Encounter (Signed)
Pt advised on 25 mg samples available but vouchers will be available at front desk.  Patient reports Minna Antis will be picking up vouchers for him

## 2023-06-19 NOTE — Telephone Encounter (Signed)
Pt reports Arlee Muslim will be picking up vouchers for him.

## 2023-06-20 ENCOUNTER — Other Ambulatory Visit: Payer: Self-pay | Admitting: Family Medicine

## 2023-06-20 ENCOUNTER — Telehealth: Payer: Self-pay | Admitting: Family Medicine

## 2023-06-20 MED ORDER — TRAZODONE HCL 100 MG PO TABS: 50.0000 mg | ORAL_TABLET | Freq: Every evening | ORAL | 1 refills | Status: DC | PRN

## 2023-06-20 NOTE — Telephone Encounter (Signed)
The patient states he is stuck between a rock and a hard place. He does not have any JARDIANCE 25 MG TABS tablet left and he cannot afford to pay $500 for another prescription. He said his insurance will cover it again starting January 5th but needs some to get him until then somehow. He was told that the office no longer carries samples either so he is not sure what to do. Please assist patient further.

## 2023-06-20 NOTE — Telephone Encounter (Signed)
Medication Refill -  Most Recent Primary Care Visit:  Provider: Malva Limes  Department: [facility]  Visit Type: OFFICE VISIT  Date: 05/06/2023  Medication:   traZODone (DESYREL) 100 MG tablet   Has the patient contacted their pharmacy? Yes   Is this the correct pharmacy for this prescription? No If no, delete pharmacy and type the correct one.  This is the patient's preferred pharmacy:   [facility] [place] - [address] Phone: [phone]  Fax: [phone]      Has the prescription been filled recently? No  Is the patient out of the medication? Yes  Has the patient been seen for an appointment in the last year OR does the patient have an upcoming appointment? Yes  Can we respond through MyChart? No  Please assist patient further

## 2023-06-20 NOTE — Telephone Encounter (Signed)
Requested Prescriptions  Pending Prescriptions Disp Refills   traZODone (DESYREL) 100 MG tablet 90 tablet 1    Sig: Take 0.5-1 tablets (50-100 mg total) by mouth at bedtime as needed. for sleep     Psychiatry: Antidepressants - Serotonin Modulator Passed - 06/20/2023  3:35 PM      Passed - Valid encounter within last 6 months    Recent Outpatient Visits           1 month ago Hypothyroidism, unspecified type   [facility], [facility]   2 months ago Dysuria   [facility] Mal[facility] L[facility], MD   3 months ago Type 2 diabetes mellitus with chronic kidney disease, without long-term current use of insulin, unspecified CKD stage [facility])   [facility] Malva Limes, MD   7 months ago Type 2 diabetes mellitus with chronic kidney disease, without long-term current use of insulin, unspecified CKD stage [facility])   [facility] Malva Limes, MD   10 months ago Type 2 diabetes mellitus with chronic kidney disease, without long-term current use of insulin, unspecified CKD stage [facility])   [place] Malva Limes, MD       Future Appointments             In 1 week Fisher, Demetrios Isaacs, MD [facility]

## 2023-06-21 DIAGNOSIS — R2689 Other abnormalities of gait and mobility: Secondary | ICD-10-CM | POA: Diagnosis not present

## 2023-06-21 DIAGNOSIS — R2681 Unsteadiness on feet: Secondary | ICD-10-CM | POA: Diagnosis not present

## 2023-06-21 DIAGNOSIS — R296 Repeated falls: Secondary | ICD-10-CM | POA: Diagnosis not present

## 2023-06-21 DIAGNOSIS — M158 Other polyosteoarthritis: Secondary | ICD-10-CM | POA: Diagnosis not present

## 2023-06-24 DIAGNOSIS — I10 Essential (primary) hypertension: Secondary | ICD-10-CM | POA: Diagnosis not present

## 2023-06-24 DIAGNOSIS — R0602 Shortness of breath: Secondary | ICD-10-CM | POA: Diagnosis not present

## 2023-06-24 DIAGNOSIS — N1832 Chronic kidney disease, stage 3b: Secondary | ICD-10-CM | POA: Diagnosis not present

## 2023-06-24 DIAGNOSIS — E119 Type 2 diabetes mellitus without complications: Secondary | ICD-10-CM | POA: Diagnosis not present

## 2023-06-24 DIAGNOSIS — Z951 Presence of aortocoronary bypass graft: Secondary | ICD-10-CM | POA: Diagnosis not present

## 2023-06-24 DIAGNOSIS — I25118 Atherosclerotic heart disease of native coronary artery with other forms of angina pectoris: Secondary | ICD-10-CM | POA: Diagnosis not present

## 2023-06-24 DIAGNOSIS — G4733 Obstructive sleep apnea (adult) (pediatric): Secondary | ICD-10-CM | POA: Diagnosis not present

## 2023-06-24 DIAGNOSIS — R600 Localized edema: Secondary | ICD-10-CM | POA: Diagnosis not present

## 2023-06-24 DIAGNOSIS — K219 Gastro-esophageal reflux disease without esophagitis: Secondary | ICD-10-CM | POA: Diagnosis not present

## 2023-06-24 DIAGNOSIS — E7800 Pure hypercholesterolemia, unspecified: Secondary | ICD-10-CM | POA: Diagnosis not present

## 2023-06-24 DIAGNOSIS — J449 Chronic obstructive pulmonary disease, unspecified: Secondary | ICD-10-CM | POA: Diagnosis not present

## 2023-06-25 ENCOUNTER — Ambulatory Visit: Payer: Medicare Other

## 2023-06-25 DIAGNOSIS — Z0000 Encounter for general adult medical examination without abnormal findings: Secondary | ICD-10-CM | POA: Diagnosis not present

## 2023-06-25 DIAGNOSIS — M159 Polyosteoarthritis, unspecified: Secondary | ICD-10-CM | POA: Diagnosis not present

## 2023-06-25 DIAGNOSIS — R296 Repeated falls: Secondary | ICD-10-CM | POA: Diagnosis not present

## 2023-06-25 DIAGNOSIS — M6281 Muscle weakness (generalized): Secondary | ICD-10-CM | POA: Diagnosis not present

## 2023-06-25 DIAGNOSIS — M158 Other polyosteoarthritis: Secondary | ICD-10-CM | POA: Diagnosis not present

## 2023-06-25 DIAGNOSIS — R2689 Other abnormalities of gait and mobility: Secondary | ICD-10-CM | POA: Diagnosis not present

## 2023-06-25 DIAGNOSIS — R2681 Unsteadiness on feet: Secondary | ICD-10-CM | POA: Diagnosis not present

## 2023-06-25 NOTE — Patient Instructions (Addendum)
Mr. Mark Terry , Thank you for taking time to come for your Medicare Wellness Visit. I appreciate your ongoing commitment to your health goals. Please review the following plan we discussed and let me know if I can assist you in the future.   Referrals/Orders/Follow-Ups/Clinician Recommendations: NONE  This is a list of the screening recommended for you and due dates:  Health Maintenance  Topic Date Due   COVID-19 Vaccine (3 - Pfizer risk series) 06/07/2020   DTaP/Tdap/Td vaccine (3 - Td or Tdap) 05/14/2022   Hemoglobin A1C  06/04/2023   Eye exam for diabetics  06/26/2023   Medicare Annual Wellness Visit  06/24/2024   Pneumonia Vaccine  Completed   Flu Shot  Completed   Zoster (Shingles) Vaccine  Completed   HPV Vaccine  Aged Out   Colon Cancer Screening  Discontinued    Advanced directives: (ACP Link)Information on Advanced Care Planning can be found at [facility] Advance Health Care Directives Advance Health Care Directives ([email or web address]   Next Medicare Annual Wellness Visit scheduled for next year: Yes   07/07/24 @ 3:50 PM IN PERSON

## 2023-06-25 NOTE — Progress Notes (Signed)
Subjective:   Mark Adu. is a 88 y.o. male who presents for Medicare Annual/Subsequent preventive examination.  Visit Complete: Virtual I connected with  Mark Terry. on 06/25/23 by a audio enabled telemedicine application and verified that I am speaking with the correct person using two identifiers.  Patient Location: Home  Provider Location: Office/Clinic  I discussed the limitations of evaluation and management by telemedicine. The patient expressed understanding and agreed to proceed.  Vital Signs: Because this visit was a virtual/telehealth visit, some criteria may be missing or patient reported. Any vitals not documented were not able to be obtained and vitals that have been documented are patient reported.  Cardiac Risk Factors include: advanced age (>58men, >5 women);diabetes mellitus;male gender;hypertension;sedentary lifestyle;obesity (BMI >30kg/m2)     Objective:    Today's Vitals   06/25/23 1546  PainSc: 5    There is no height or weight on file to calculate BMI.     06/25/2023    3:58 PM 04/10/2023    1:13 AM 06/20/2022    2:59 PM 10/12/2019    9:59 AM 08/26/2018    8:34 AM 04/10/2018    8:16 AM 01/03/2018    1:13 PM  Advanced Directives  Does Patient Have a Medical Advance Directive? No No No Yes No Yes Yes  Type of Theme park manager;Living will  Healthcare Power of [place];Living will Healthcare Power of [place];Living will  Copy of Healthcare Power of Attorney in Chart?    No - copy requested  No - copy requested No - copy requested  Would patient like information on creating a medical advance directive? No - Patient declined  No - Patient declined  No - Patient declined      Current Medications (verified) Outpatient Encounter Medications as of 06/25/2023  Medication Sig   Accu-Chek FastClix Lancets MISC USE TO CHECK BLOOD GLUCOSE AS  DIRECTED   ACCU-CHEK GUIDE test strip CHECK BLOOD SUGAR ONCE DAILY FOR TYPE  2 DIABETES   aspirin 81 MG tablet Take 81 mg by mouth daily.   Blood Glucose Monitoring Suppl (ACCU-CHEK GUIDE) w/Device KIT USE AS DIRECTED DAILY   doxazosin (CARDURA) 2 MG tablet TAKE 1 TABLET BY MOUTH  DAILY   DULoxetine (CYMBALTA) 30 MG capsule TAKE 1 CAPSULE BY MOUTH EVERY DAY   fluticasone (FLONASE) 50 MCG/ACT nasal spray USE 1 TO 2 SPRAYS IN BOTH  NOSTRILS DAILY   furosemide (LASIX) 40 MG tablet TAKE 1 TABLET BY MOUTH DAILY   gabapentin (NEURONTIN) 300 MG capsule TAKE 1 CAPSULE BY MOUTH 3 TIMES  DAILY   glimepiride (AMARYL) 1 MG tablet TAKE 1 TABLET BY MOUTH DAILY   HYDROcodone-acetaminophen (NORCO/VICODIN) 5-325 MG tablet TAKE 1 TABLET BY MOUTH TWICE DAILY AS NEEDED   ibuprofen (ADVIL,MOTRIN) 200 MG tablet Take 200 mg by mouth every 6 (six) hours as needed.   JARDIANCE 25 MG TABS tablet TAKE 1 TABLET BY MOUTH  DAILY   Lancets Misc. (ACCU-CHEK FASTCLIX LANCET) KIT Used to check glucose.  DX E11.9   levothyroxine (SYNTHROID) 50 MCG tablet Take 1 tablet (50 mcg total) by mouth daily.   loratadine (CLARITIN) 10 MG tablet Take 10 mg by mouth daily.   losartan (COZAAR) 50 MG tablet TAKE 1 TABLET BY MOUTH DAILY   meloxicam (MOBIC) 15 MG tablet Take 15 mg by mouth daily.   metFORMIN (GLUCOPHAGE-XR) 500 MG 24 hr tablet TAKE 1 TABLET BY MOUTH TWICE  DAILY   metoprolol  tartrate (LOPRESSOR) 25 MG tablet TAKE 1 TABLET BY MOUTH TWICE  DAILY   Multiple Vitamin (MULTIVITAMIN WITH MINERALS) TABS Take 1 tablet by mouth daily.   omeprazole (PRILOSEC) 40 MG capsule TAKE 1 CAPSULE BY MOUTH DAILY   oxybutynin (DITROPAN) 5 MG tablet TAKE 1 TABLET BY MOUTH TWICE  DAILY   potassium chloride SA (KLOR-CON M) 20 MEQ tablet TAKE 1 TABLET BY MOUTH DAILY   simvastatin (ZOCOR) 40 MG tablet TAKE 1 TABLET BY MOUTH AT  BEDTIME   traZODone (DESYREL) 100 MG tablet Take 0.5-1 tablets (50-100 mg total) by mouth at bedtime as needed. for sleep   Vitamin D, Ergocalciferol, (DRISDOL) 1.25 MG (50000 UNIT) CAPS capsule Take  50,000 Units by mouth every 7 (seven) days.   No facility-administered encounter medications on file as of 06/25/2023.    Allergies (verified) Propoxyphene, Codeine sulfate, Darvon [propoxyphene hcl], Demerol [meperidine], and Oxycodone   History: Past Medical History:  Diagnosis Date   Anemia    Bruises easily    Diabetes mellitus without complication (HCC)    H/O esophagogastroduodenoscopy    H/O hiatal hernia    History of blood transfusion    no reaction noted   History of bronchitis    last time a couple of yrs ago   History of gastric ulcer    History of MRSA infection    Hyperlipidemia    Hypertension    Leg cramps    takes quinine prn   Melanoma (HCC)    Panic attacks    Pneumonia    hx of last time a couple of yrs ago   PONV (postoperative nausea and vomiting)    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 laminectomy Dr. Jordan Likes 2008 cervical fusion x 2   bilateral cataract surgery     CHOLECYSTECTOMY  1970   COLONOSCOPY     CORONARY ANGIOPLASTY     CORONARY ARTERY BYPASS GRAFT  2000    3 vessels   ESOPHAGOGASTRODUODENOSCOPY (EGD) WITH PROPOFOL N/A 11/18/2017   Procedure: ESOPHAGOGASTRODUODENOSCOPY (EGD) WITH PROPOFOL;  Surgeon: Wyline Mood, MD;  Location[facility] END[facility];  Service: Gastroenterology;  Laterality: N/A;   GANGLION CYST EXCISION Right 10/22/2022   Dr. Rosita Kea   JOINT REPLACEMENT     MOHS SURGERY Left 12/30/2018   [facility], Ella Bodo, MD   NECK SURGERY     x 2   prostate laser surgery     x 2   REPLACEMENT TOTAL KNEE BILATERAL   '86 and 2000   REVERSE SHOULDER ARTHROPLASTY  05/30/2012   Procedure: REVERSE SHOULDER ARTHROPLASTY;  Surgeon: Verlee Rossetti, MD;  Location: [facility] OR;  Service: Orthopedics;  Laterality: Right;  right reverse shoulder arthroplasty   SQUAMOUS CELL CARCINOMA EXCISION  05/01/2019   mid superior forehead/ [facility]/ Dr. Christiane Ha L. Cook   TOTAL HIP ARTHROPLASTY Right 2012   [facility]   Family History  Problem Relation Age of Onset   Alzheimer's disease Mother    Heart attack Father    Bipolar disorder Brother    Kidney disease Neg Hx  Prostate cancer Neg Hx    Social History   Socioeconomic History   Marital status: Divorced    Spouse name: Not on file   Number of children: 2   Years of education: 12   Highest education level: 12th grade  Occupational History   Occupation: Retired  Tobacco Use   Smoking status: Former    Current packs/day: 0.00    Average packs/day: 1 pack/day for 25.0 years (25.0 ttl pk-yrs)    Types: Cigarettes    Start date: 07/10/1943    Quit date: 07/09/1968    Years since quitting: 54.9    Passive exposure: Past   Smokeless tobacco: Never   Tobacco comments:    quit at age 17  Vaping Use   Vaping status: Never Used  Substance and Sexual Activity   Alcohol use: No    Alcohol/week: 0.0 standard drinks of alcohol   Drug use: No   Sexual activity: Never  Other Topics Concern   Not on file  Social History Narrative   Not on file   Social Drivers of Health   Financial Resource Strain: Low Risk  (06/25/2023)   Overall Financial Resource Strain (CARDIA)    Difficulty of Paying Living Expenses: Not very hard  Food Insecurity: No Food Insecurity (06/25/2023)   Hunger Vital Sign    Worried About Running Out of Food in the Last Year: Never true    Ran Out of Food in the Last Year: Never true  Transportation Needs: No Transportation Needs (06/25/2023)   PRAPARE - Administrator, Civil Service (Medical): No    Lack of Transportation  (Non-Medical): No  Physical Activity: Sufficiently Active (06/25/2023)   Exercise Vital Sign    Days of Exercise per Week: 3 days    Minutes of Exercise per Session: 60 min  Stress: No Stress Concern Present (06/25/2023)   [employer] of Occupational Health - Occupational Stress Questionnaire    Feeling of Stress : Only a little  Social Connections: Socially Isolated (06/25/2023)   Social Connection and Isolation Panel [NHANES]    Frequency of Communication with Friends and Family: More than three times a week    Frequency of Social Gatherings with Friends and Family: Never    Attends Religious Services: Never    Database administrator or Organizations: No    Attends Engineer, structural: Never    Marital Status: Divorced    Tobacco Counseling Counseling given: Not Answered Tobacco comments: quit at age 74   Clinical Intake:  Pre-visit preparation completed: Yes  Pain : 0-10 Pain Score: 5  Pain Type: Chronic pain Pain Location: Back Pain Orientation: Lower Pain Descriptors / Indicators: Aching, Discomfort, Stabbing Pain Onset: More than a month ago Pain Frequency: Constant Pain Relieving Factors: SEES PAIN DOCTOR  Pain Relieving Factors: SEES PAIN DOCTOR  BMI - recorded: 39.1 Nutritional Status: BMI > 30  Obese Nutritional Risks: None Diabetes: Yes CBG done?: No Did pt. bring in CBG monitor from home?: No  How often do you need to have someone help you when you read instructions, pamphlets, or other written materials from your doctor or pharmacy?: 1 - Never  Interpreter Needed?: No  Information entered by :: Kennedy Bucker, LPN   Activities of Daily Living    06/25/2023    4:02 PM 11/06/2022   10:19 AM  In your present state of health, do you have any difficulty performing the following activities:  Hearing? 0 0  Vision? 0  0  Difficulty concentrating or making decisions? 0 1  Walking or climbing stairs? 1 0  Comment SLOW, GOING DOWN THE  STAIRS IS HARD   Dressing or bathing? 0 0  Doing errands, shopping? 0 0  Preparing Food and eating ? N   Using the Toilet? N   In the past six months, have you accidently leaked urine? N   Do you have problems with loss of bowel control? N   Managing your Medications? N   Managing your Finances? N   Housekeeping or managing your Housekeeping? Y     Patient Care Team: Malva Limes, MD as PCP - General (Family Medicine) Alwyn Pea, MD as Consulting Physician (Cardiology) Mertie Moores, MD as Referring Physician (Specialist) Merri Ray, MD as Referring Physician (Physical Medicine and Rehabilitation) Jesusita Oka, MD as Consulting Physician (Dermatology) Sherrin Daisy, MD as Referring Physician (Dermatology) Pa, [facility] (Optometry) Campbell Lerner, MD as Consulting Physician (General Surgery) Dingeldein, Viviann Spare, MD (Ophthalmology)  Indicate any recent Medical Services you may have received from other than [facility] providers in the past year (date may be approximate).     Assessment:   This is a routine wellness examination for Mark Terry.  Hearing/Vision screen Hearing Screening - Comments:: WEARS AIDS, BOTH EARS  Vision Screening - Comments:: WEARS GLASSES- DINGELDEIN   Goals Addressed             This Visit's Progress    DIET - REDUCE SUGAR INTAKE         Depression Screen    06/25/2023    3:53 PM 03/04/2023    8:18 AM 11/06/2022   10:19 AM 08/01/2022   10:59 AM 06/20/2022    2:54 PM 03/02/2022    8:20 AM 06/14/2021   10:01 AM  PHQ 2/9 Scores  PHQ - 2 Score 0 2 0 0 0 0 0  PHQ- 9 Score 0 9 2 0 0 0 3    Fall Risk    06/25/2023    4:00 PM 04/04/2023   10:56 AM 03/04/2023    8:18 AM 11/06/2022   10:18 AM 08/01/2022   10:58 AM  Fall Risk   Falls in the past year? 1 0 1 0 1  Number falls in past yr: 0 0 1 0 0  Injury with Fall? 0 0  0 1  Risk for fall due to : No Fall Risks;History of fall(s);Impaired mobility  History of  fall(s)  History of fall(s);Impaired balance/gait;Impaired mobility  Follow up Falls prevention discussed;Falls evaluation completed  Falls evaluation completed  Falls evaluation completed;Education provided;Falls prevention discussed    MEDICARE RISK AT HOME: Medicare Risk at Home Any stairs in or around the home?: No If so, are there any without handrails?: No Home free of loose throw rugs in walkways, pet beds, electrical cords, etc?: Yes Adequate lighting in your home to reduce risk of falls?: Yes Life alert?: No Use of a cane, Delpilar or w/c?: Yes (Mole ALL THE TIME- USES W/C IN HOUSE) Grab bars in the bathroom?: Yes Shower chair or bench in shower?: Yes Elevated toilet seat or a handicapped toilet?: Yes  TIMED UP AND GO:  Was the test performed?  No    Cognitive Function:        06/25/2023    4:03 PM 06/20/2022    3:08 PM 01/08/2017    9:34 AM  6CIT Screen  What Year? 0 points 0 points 0 points  What month? 0 points 0  points 0 points  What time? 0 points 0 points 0 points  Count back from 20 0 points 0 points 0 points  Months in reverse 0 points 0 points 0 points  Repeat phrase 0 points 4 points 2 points  Total Score 0 points 4 points 2 points    Immunizations Immunization History  Administered Date(s) Administered   Fluad Quad(high Dose 65+) 03/25/2019, 05/30/2020, 06/14/2021   Fluad Trivalent(High Dose 65+) 03/04/2023   Influenza, High Dose Seasonal PF 04/23/2015, 03/31/2016, 05/02/2017, 04/02/2018, 04/24/2022   Influenza-Unspecified 03/09/2014   Moderna SARS-COV2 Booster Vaccination 05/10/2020   PFIZER(Purple Top)SARS-COV-2 Vaccination 08/04/2019, 08/25/2019   Pneumococcal Conjugate-13 03/16/2014   Pneumococcal Polysaccharide-23 06/12/2000, 01/27/2009   Td 02/25/2001   Tdap 05/14/2012   Zoster Recombinant(Shingrix) 03/14/2018, 05/19/2018   Zoster, Live 07/16/2006    TDAP status: Due, Education has been provided regarding the importance of this vaccine.  Advised may receive this vaccine at local pharmacy or Health Dept. Aware to provide a copy of the vaccination record if obtained from local pharmacy or Health Dept. Verbalized acceptance and understanding.  Flu Vaccine status: Up to date  Pneumococcal vaccine status: Declined,  Education has been provided regarding the importance of this vaccine but patient still declined. Advised may receive this vaccine at local pharmacy or Health Dept. Aware to provide a copy of the vaccination record if obtained from local pharmacy or Health Dept. Verbalized acceptance and understanding.   Covid-19 vaccine status: Completed vaccines  Qualifies for Shingles Vaccine? Yes   Zostavax completed Yes   Shingrix Completed?: Yes  Screening Tests Health Maintenance  Topic Date Due   COVID-19 Vaccine (3 - Pfizer risk series) 06/07/2020   DTaP/Tdap/Td (3 - Td or Tdap) 05/14/2022   HEMOGLOBIN A1C  06/04/2023   OPHTHALMOLOGY EXAM  06/26/2023   Medicare Annual Wellness (AWV)  06/24/2024   Pneumonia Vaccine 66+ Years old  Completed   INFLUENZA VACCINE  Completed   Zoster Vaccines- Shingrix  Completed   HPV VACCINES  Aged Out   Colonoscopy  Discontinued    Health Maintenance  Health Maintenance Due  Topic Date Due   COVID-19 Vaccine (3 - Pfizer risk series) 06/07/2020   DTaP/Tdap/Td (3 - Td or Tdap) 05/14/2022   HEMOGLOBIN A1C  06/04/2023    Colorectal cancer screening: No longer required.   Lung Cancer Screening: (Low Dose CT Chest recommended if Age 33-80 years, 20 pack-year currently smoking OR have quit w/in 15years.) does not qualify.    Additional Screening:  Hepatitis C Screening: does not qualify; Completed NO  Vision Screening: Recommended annual ophthalmology exams for early detection of glaucoma and other disorders of the eye. Is the patient up to date with their annual eye exam?  Yes  Who is the provider or what is the name of the office in which the patient attends annual eye exams?  [place] If pt is not established with a provider, would they like to be referred to a provider to establish care? No .   Dental Screening: Recommended annual dental exams for proper oral hygiene  Diabetic Foot Exam: Diabetic Foot Exam: Overdue, Pt has been advised about the importance in completing this exam. Pt is scheduled for diabetic foot exam on 00.  Community Resource Referral / Chronic Care Management: CRR required this visit?  No   CCM required this visit?  No     Plan:     I have personally reviewed and noted the following in the patient's chart:   Medical and  social history Use of alcohol, tobacco or illicit drugs  Current medications and supplements including opioid prescriptions. Patient is not currently taking opioid prescriptions. Functional ability and status Nutritional status Physical activity Advanced directives List of other physicians Hospitalizations, surgeries, and ER visits in previous 12 months Vitals Screenings to include cognitive, depression, and falls Referrals and appointments  In addition, I have reviewed and discussed with patient certain preventive protocols, quality metrics, and best practice recommendations. A written personalized care plan for preventive services as well as general preventive health recommendations were provided to patient.     Hal Hope, LPN   [id number]   After Visit Summary: (MyChart) Due to this being a telephonic visit, the after visit summary with patients personalized plan was offered to patient via MyChart   Nurse Notes: NONE

## 2023-06-26 DIAGNOSIS — R2681 Unsteadiness on feet: Secondary | ICD-10-CM | POA: Diagnosis not present

## 2023-06-26 DIAGNOSIS — M159 Polyosteoarthritis, unspecified: Secondary | ICD-10-CM | POA: Diagnosis not present

## 2023-06-26 DIAGNOSIS — M158 Other polyosteoarthritis: Secondary | ICD-10-CM | POA: Diagnosis not present

## 2023-06-26 DIAGNOSIS — R296 Repeated falls: Secondary | ICD-10-CM | POA: Diagnosis not present

## 2023-06-26 DIAGNOSIS — M6281 Muscle weakness (generalized): Secondary | ICD-10-CM | POA: Diagnosis not present

## 2023-06-26 DIAGNOSIS — R2689 Other abnormalities of gait and mobility: Secondary | ICD-10-CM | POA: Diagnosis not present

## 2023-06-27 DIAGNOSIS — M158 Other polyosteoarthritis: Secondary | ICD-10-CM | POA: Diagnosis not present

## 2023-06-27 DIAGNOSIS — R296 Repeated falls: Secondary | ICD-10-CM | POA: Diagnosis not present

## 2023-06-27 DIAGNOSIS — R2681 Unsteadiness on feet: Secondary | ICD-10-CM | POA: Diagnosis not present

## 2023-06-27 DIAGNOSIS — R2689 Other abnormalities of gait and mobility: Secondary | ICD-10-CM | POA: Diagnosis not present

## 2023-06-28 ENCOUNTER — Ambulatory Visit (INDEPENDENT_AMBULATORY_CARE_PROVIDER_SITE_OTHER): Payer: Medicare Other | Admitting: Family Medicine

## 2023-06-28 VITALS — BP 135/73 | HR 84 | Resp 16 | Ht 61.0 in | Wt 205.0 lb

## 2023-06-28 DIAGNOSIS — M5030 Other cervical disc degeneration, unspecified cervical region: Secondary | ICD-10-CM

## 2023-06-28 DIAGNOSIS — M5126 Other intervertebral disc displacement, lumbar region: Secondary | ICD-10-CM | POA: Diagnosis not present

## 2023-06-28 DIAGNOSIS — M47816 Spondylosis without myelopathy or radiculopathy, lumbar region: Secondary | ICD-10-CM

## 2023-06-28 DIAGNOSIS — L03119 Cellulitis of unspecified part of limb: Secondary | ICD-10-CM | POA: Diagnosis not present

## 2023-06-28 DIAGNOSIS — E039 Hypothyroidism, unspecified: Secondary | ICD-10-CM

## 2023-06-28 DIAGNOSIS — Z7984 Long term (current) use of oral hypoglycemic drugs: Secondary | ICD-10-CM

## 2023-06-28 DIAGNOSIS — N1832 Chronic kidney disease, stage 3b: Secondary | ICD-10-CM

## 2023-06-28 DIAGNOSIS — M48062 Spinal stenosis, lumbar region with neurogenic claudication: Secondary | ICD-10-CM | POA: Diagnosis not present

## 2023-06-28 DIAGNOSIS — E1121 Type 2 diabetes mellitus with diabetic nephropathy: Secondary | ICD-10-CM | POA: Diagnosis not present

## 2023-06-28 DIAGNOSIS — M5416 Radiculopathy, lumbar region: Secondary | ICD-10-CM | POA: Diagnosis not present

## 2023-06-28 MED ORDER — DOXYCYCLINE HYCLATE 100 MG PO TABS: 100.0000 mg | ORAL_TABLET | Freq: Two times a day (BID) | ORAL | 0 refills | Status: AC

## 2023-06-28 NOTE — Patient Instructions (Addendum)
Please review the attached list of medications and notify my office if there are any errors.   You are due for a Tdap (tetanus-diptheria-pertussis vaccine) which protects you from tetanus and whooping cough. Please check with your insurance plan or pharmacy regarding coverage for this vaccine.   

## 2023-06-28 NOTE — Progress Notes (Signed)
Established patient visit   Patient: Mark Terry.   DOB: 1934-12-29   88 y.o. Male  MRN: [id number] Visit Date: 06/28/2023  Today's healthcare provider: Mila Merry, MD   Chief Complaint  Patient presents with   Hypothyroidism   Subjective    HPI  Follow up CKD, diabetes, hypertension. Reports bilateral LE edema which is chronic, but more redness in legs lately. Chronic back back had lumbar procedure done this morning Dr. Yves Dill. Has moved into [facility] getting PT which has been good for mood and mobility by his report. Main concern is that he is in doughnut hole and [place] is cost prohibitive. Sugars rise dramatically if he misses dose, so has been stretching out by taking 1/2 tablet daily.  Lab Results  Component Value Date   HGBA1C 8.2 (A) 03/04/2023   HGBA1C 8.2 (A) 11/06/2022   HGBA1C 7.9 (A) 08/01/2022   Lab Results  Component Value Date   NA 143 03/04/2023   K 4.6 03/04/2023   CREATININE 1.85 (H) 03/04/2023   EGFR 35 (L) 03/04/2023   GLUCOSE 169 (H) 03/04/2023   Lab Results  Component Value Date   TSH 1.890 05/06/2023     Medications: Outpatient Medications Prior to Visit  Medication Sig   Accu-Chek FastClix Lancets MISC USE TO CHECK BLOOD GLUCOSE AS  DIRECTED   ACCU-CHEK GUIDE test strip CHECK BLOOD SUGAR ONCE DAILY FOR TYPE 2 DIABETES   aspirin 81 MG tablet Take 81 mg by mouth daily.   Blood Glucose Monitoring Suppl (ACCU-CHEK GUIDE) w/Device KIT USE AS DIRECTED DAILY   doxazosin (CARDURA) 2 MG tablet TAKE 1 TABLET BY MOUTH  DAILY   DULoxetine (CYMBALTA) 30 MG capsule TAKE 1 CAPSULE BY MOUTH EVERY DAY   fluticasone (FLONASE) 50 MCG/ACT nasal spray USE 1 TO 2 SPRAYS IN BOTH  NOSTRILS DAILY   furosemide (LASIX) 40 MG tablet TAKE 1 TABLET BY MOUTH DAILY   gabapentin (NEURONTIN) 300 MG capsule TAKE 1 CAPSULE BY MOUTH 3 TIMES  DAILY   glimepiride (AMARYL) 1 MG tablet TAKE 1 TABLET BY MOUTH DAILY   HYDROcodone-acetaminophen (NORCO/VICODIN)  5-325 MG tablet TAKE 1 TABLET BY MOUTH TWICE DAILY AS NEEDED   ibuprofen (ADVIL,MOTRIN) 200 MG tablet Take 200 mg by mouth every 6 (six) hours as needed.   JARDIANCE 25 MG TABS tablet TAKE 1 TABLET BY MOUTH  DAILY   Lancets Misc. (ACCU-CHEK FASTCLIX LANCET) KIT Used to check glucose.  DX E11.9   levothyroxine (SYNTHROID) 50 MCG tablet Take 1 tablet (50 mcg total) by mouth daily.   loratadine (CLARITIN) 10 MG tablet Take 10 mg by mouth daily.   losartan (COZAAR) 50 MG tablet TAKE 1 TABLET BY MOUTH DAILY   meloxicam (MOBIC) 15 MG tablet Take 15 mg by mouth daily.   metFORMIN (GLUCOPHAGE-XR) 500 MG 24 hr tablet TAKE 1 TABLET BY MOUTH TWICE  DAILY   metoprolol tartrate (LOPRESSOR) 25 MG tablet TAKE 1 TABLET BY MOUTH TWICE  DAILY   Multiple Vitamin (MULTIVITAMIN WITH MINERALS) TABS Take 1 tablet by mouth daily.   omeprazole (PRILOSEC) 40 MG capsule TAKE 1 CAPSULE BY MOUTH DAILY   oxybutynin (DITROPAN) 5 MG tablet TAKE 1 TABLET BY MOUTH TWICE  DAILY   potassium chloride SA (KLOR-CON M) 20 MEQ tablet TAKE 1 TABLET BY MOUTH DAILY   simvastatin (ZOCOR) 40 MG tablet TAKE 1 TABLET BY MOUTH AT  BEDTIME   traZODone (DESYREL) 100 MG tablet Take 0.5-1 tablets (50-100 mg  total) by mouth at bedtime as needed. for sleep   Vitamin D, Ergocalciferol, (DRISDOL) 1.25 MG (50000 UNIT) CAPS capsule Take 50,000 Units by mouth every 7 (seven) days.   No facility-administered medications prior to visit.    Review of Systems  Constitutional:  Negative for appetite change, chills and fever.  Respiratory:  Negative for chest tightness, shortness of breath and wheezing.   Cardiovascular:  Negative for chest pain and palpitations.  Gastrointestinal:  Negative for abdominal pain, nausea and vomiting.       Objective    BP 135/73   Pulse 84   Resp 16   Ht 5\' 1"  (1.549 m)   Wt 205 lb (93 kg)   SpO2 99%   BMI 38.73 kg/m    Physical Exam   General: Appearance:    Obese male in no acute distress  Eyes:     PERRL, conjunctiva/corneas clear, EOM's intact       Lungs:     Clear to auscultation bilaterally, respirations unlabored  Heart:    Normal heart rate. Normal rhythm. No murmurs, rubs, or gallops.    MS:   All extremities are intact. 2+ bipedal pitting edema with scattered patches of erythema bilaterally with a few superficial sores.   Neurologic:   Awake, alert, oriented x 3. No apparent focal neurological defect.         Assessment & Plan     1. Chronic kidney disease, stage 3b (HCC) (Primary)  - Renal function panel  2. Diabetes mellitus with nephropathy (HCC) Given samples Farxiga 10mg  to take one tablet daily in place of Jardiance until he is out of the doughnut hole the first of the year.  - Hemoglobin A1c  3. Cellulitis of lower extremity, unspecified laterality - doxycycline (VIBRA-TABS) 100 MG tablet; Take 1 tablet (100 mg total) by mouth 2 (two) times daily for 10 days.  Dispense: 20 tablet; Refill: 0  Call if symptoms change or if not rapidly improving.    4. Hypothyroidism, unspecified type  - TSH - T4, free  5. Lumbar spondylosis  6. DDD (degenerative disc disease), cervical Followed by Dr. Yves Dill  Continue PT at [facility], [place]  [facility] [phone] (phone) [phone] (fax)  [facility]

## 2023-06-29 LAB — RENAL FUNCTION PANEL
Albumin: 4 g/dL (ref 3.7–4.7)
BUN/Creatinine Ratio: 15 (ref 10–24)
BUN: 27 mg/dL (ref 8–27)
CO2: 21 mmol/L (ref 20–29)
Calcium: 9.1 mg/dL (ref 8.6–10.2)
Chloride: 105 mmol/L (ref 96–106)
Creatinine, Ser: 1.86 mg/dL — ABNORMAL HIGH (ref 0.76–1.27)
Glucose: 250 mg/dL — ABNORMAL HIGH (ref 70–99)
Phosphorus: 3.6 mg/dL (ref 2.8–4.1)
Potassium: 4.3 mmol/L (ref 3.5–5.2)
Sodium: 141 mmol/L (ref 134–144)
eGFR: 35 mL/min/{1.73_m2} — ABNORMAL LOW (ref >59)

## 2023-06-29 LAB — T4, FREE: Free T4: 1.11 ng/dL (ref 0.82–1.77)

## 2023-06-29 LAB — HEMOGLOBIN A1C
Est. average glucose Bld gHb Est-mCnc: 177 mg/dL
Hgb A1c MFr Bld: 7.8 % — ABNORMAL HIGH (ref 4.8–5.6)

## 2023-06-29 LAB — TSH: TSH: 1.45 u[IU]/mL (ref 0.450–4.500)

## 2023-07-01 ENCOUNTER — Other Ambulatory Visit: Payer: Self-pay

## 2023-07-01 DIAGNOSIS — H4912 Fourth [trochlear] nerve palsy, left eye: Secondary | ICD-10-CM | POA: Diagnosis not present

## 2023-07-01 DIAGNOSIS — M158 Other polyosteoarthritis: Secondary | ICD-10-CM | POA: Diagnosis not present

## 2023-07-01 DIAGNOSIS — Z961 Presence of intraocular lens: Secondary | ICD-10-CM | POA: Diagnosis not present

## 2023-07-01 DIAGNOSIS — R296 Repeated falls: Secondary | ICD-10-CM | POA: Diagnosis not present

## 2023-07-01 DIAGNOSIS — R2689 Other abnormalities of gait and mobility: Secondary | ICD-10-CM | POA: Diagnosis not present

## 2023-07-01 DIAGNOSIS — R2681 Unsteadiness on feet: Secondary | ICD-10-CM | POA: Diagnosis not present

## 2023-07-01 DIAGNOSIS — E119 Type 2 diabetes mellitus without complications: Secondary | ICD-10-CM | POA: Diagnosis not present

## 2023-07-01 MED ORDER — LOSARTAN POTASSIUM 100 MG PO TABS: 100.0000 mg | ORAL_TABLET | Freq: Every day | ORAL | 0 refills | Status: DC

## 2023-07-08 DIAGNOSIS — M6281 Muscle weakness (generalized): Secondary | ICD-10-CM | POA: Diagnosis not present

## 2023-07-08 DIAGNOSIS — M159 Polyosteoarthritis, unspecified: Secondary | ICD-10-CM | POA: Diagnosis not present

## 2023-07-11 ENCOUNTER — Other Ambulatory Visit: Payer: Self-pay | Admitting: Family Medicine

## 2023-07-11 DIAGNOSIS — R2681 Unsteadiness on feet: Secondary | ICD-10-CM | POA: Diagnosis not present

## 2023-07-11 DIAGNOSIS — R296 Repeated falls: Secondary | ICD-10-CM | POA: Diagnosis not present

## 2023-07-11 DIAGNOSIS — M158 Other polyosteoarthritis: Secondary | ICD-10-CM | POA: Diagnosis not present

## 2023-07-11 DIAGNOSIS — R2689 Other abnormalities of gait and mobility: Secondary | ICD-10-CM | POA: Diagnosis not present

## 2023-08-19 DIAGNOSIS — M5416 Radiculopathy, lumbar region: Secondary | ICD-10-CM | POA: Diagnosis not present

## 2023-08-19 DIAGNOSIS — M62830 Muscle spasm of back: Secondary | ICD-10-CM | POA: Diagnosis not present

## 2023-08-19 DIAGNOSIS — M1612 Unilateral primary osteoarthritis, left hip: Secondary | ICD-10-CM | POA: Diagnosis not present

## 2023-08-19 DIAGNOSIS — M4802 Spinal stenosis, cervical region: Secondary | ICD-10-CM | POA: Diagnosis not present

## 2023-08-19 DIAGNOSIS — M4807 Spinal stenosis, lumbosacral region: Secondary | ICD-10-CM | POA: Diagnosis not present

## 2023-08-19 DIAGNOSIS — M5412 Radiculopathy, cervical region: Secondary | ICD-10-CM | POA: Diagnosis not present

## 2023-08-19 DIAGNOSIS — M5126 Other intervertebral disc displacement, lumbar region: Secondary | ICD-10-CM | POA: Diagnosis not present

## 2023-08-19 DIAGNOSIS — M47816 Spondylosis without myelopathy or radiculopathy, lumbar region: Secondary | ICD-10-CM | POA: Diagnosis not present

## 2023-08-19 DIAGNOSIS — M48062 Spinal stenosis, lumbar region with neurogenic claudication: Secondary | ICD-10-CM | POA: Diagnosis not present

## 2023-08-28 ENCOUNTER — Ambulatory Visit: Payer: Self-pay | Admitting: Family Medicine

## 2023-09-02 ENCOUNTER — Other Ambulatory Visit: Payer: Self-pay | Admitting: Family Medicine

## 2023-09-03 NOTE — Telephone Encounter (Signed)
 Requested Prescriptions  Pending Prescriptions Disp Refills   losartan (COZAAR) 100 MG tablet [Pharmacy Med Name: Losartan Potassium 100 MG Oral Tablet] 90 tablet 1    Sig: TAKE 1 TABLET BY MOUTH DAILY     Cardiovascular:  Angiotensin Receptor Blockers Failed - 09/03/2023  5:41 PM      Failed - Cr in normal range and within 180 days    Creat  Date Value Ref Range Status  05/13/2017 1.70 (H) 0.70 - 1.11 mg/dL Final    Comment:    For patients >61 years of age, the reference limit for Creatinine is approximately 13% higher for people identified as African-American. .    Creatinine, Ser  Date Value Ref Range Status  06/28/2023 1.86 (H) 0.76 - 1.27 mg/dL Final         Passed - K in normal range and within 180 days    Potassium  Date Value Ref Range Status  06/28/2023 4.3 3.5 - 5.2 mmol/L Final         Passed - Patient is not pregnant      Passed - Last BP in normal range    BP Readings from Last 1 Encounters:  06/28/23 135/73         Passed - Valid encounter within last 6 months    Recent Outpatient Visits           2 months ago Chronic kidney disease, stage 3b (HCC)   [facility] Malva Limes, MD   4 months ago Hypothyroidism, unspecified type   [facility] Malva Limes, MD   4 months ago Dysuria   [facility] Malva Limes, MD   6 months ago Type 2 diabetes mellitus with chronic kidney disease, without long-term current use of insulin, unspecified CKD stage [facility])   [facility] Malva Limes, MD   10 months ago Type 2 diabetes mellitus with chronic kidney disease, without long-term current use of insulin, unspecified CKD stage [facility])   [facility] Malva Limes, MD       Future Appointments             In 2 weeks Fisher, Demetrios Isaacs, MD [facility]

## 2023-09-05 DIAGNOSIS — M5412 Radiculopathy, cervical region: Secondary | ICD-10-CM | POA: Diagnosis not present

## 2023-09-05 DIAGNOSIS — M4802 Spinal stenosis, cervical region: Secondary | ICD-10-CM | POA: Diagnosis not present

## 2023-09-15 ENCOUNTER — Other Ambulatory Visit: Payer: Self-pay | Admitting: Family Medicine

## 2023-09-15 DIAGNOSIS — E1121 Type 2 diabetes mellitus with diabetic nephropathy: Secondary | ICD-10-CM

## 2023-09-23 ENCOUNTER — Encounter: Payer: Self-pay | Admitting: Family Medicine

## 2023-09-23 ENCOUNTER — Ambulatory Visit (INDEPENDENT_AMBULATORY_CARE_PROVIDER_SITE_OTHER): Payer: Medicare Other | Admitting: Family Medicine

## 2023-09-23 VITALS — BP 110/57 | HR 70 | Resp 16 | Ht 61.0 in | Wt 214.0 lb

## 2023-09-23 DIAGNOSIS — Z7984 Long term (current) use of oral hypoglycemic drugs: Secondary | ICD-10-CM

## 2023-09-23 DIAGNOSIS — R609 Edema, unspecified: Secondary | ICD-10-CM

## 2023-09-23 DIAGNOSIS — I10 Essential (primary) hypertension: Secondary | ICD-10-CM

## 2023-09-23 DIAGNOSIS — L03119 Cellulitis of unspecified part of limb: Secondary | ICD-10-CM | POA: Diagnosis not present

## 2023-09-23 DIAGNOSIS — E1121 Type 2 diabetes mellitus with diabetic nephropathy: Secondary | ICD-10-CM | POA: Diagnosis not present

## 2023-09-23 MED ORDER — CEPHALEXIN 500 MG PO CAPS: 500.0000 mg | ORAL_CAPSULE | Freq: Three times a day (TID) | ORAL | 0 refills | Status: DC

## 2023-09-23 MED ORDER — DOXAZOSIN MESYLATE 1 MG PO TABS
1.0000 mg | ORAL_TABLET | Freq: Every day | ORAL | 3 refills | Status: DC
Start: 2023-09-23 — End: 2023-11-08

## 2023-09-23 NOTE — Patient Instructions (Signed)
 Mark Terry  Please review the attached list of medications and notify my office if there are any errors.   . Please bring all of your medications to every appointment so we can make sure that our medication list is the same as yours.

## 2023-09-25 DIAGNOSIS — E1121 Type 2 diabetes mellitus with diabetic nephropathy: Secondary | ICD-10-CM | POA: Diagnosis not present

## 2023-09-26 LAB — HEMOGLOBIN A1C
Est. average glucose Bld gHb Est-mCnc: 180 mg/dL
Hgb A1c MFr Bld: 7.9 % — ABNORMAL HIGH (ref 4.8–5.6)

## 2023-09-26 LAB — RENAL FUNCTION PANEL
Albumin: 4.1 g/dL (ref 3.7–4.7)
BUN/Creatinine Ratio: 17 (ref 10–24)
BUN: 39 mg/dL — ABNORMAL HIGH (ref 8–27)
CO2: 22 mmol/L (ref 20–29)
Calcium: 9.7 mg/dL (ref 8.6–10.2)
Chloride: 103 mmol/L (ref 96–106)
Creatinine, Ser: 2.26 mg/dL — ABNORMAL HIGH (ref 0.76–1.27)
Glucose: 139 mg/dL — ABNORMAL HIGH (ref 70–99)
Phosphorus: 4.9 mg/dL — ABNORMAL HIGH (ref 2.8–4.1)
Potassium: 5.1 mmol/L (ref 3.5–5.2)
Sodium: 142 mmol/L (ref 134–144)
eGFR: 27 mL/min/{1.73_m2} — ABNORMAL LOW (ref >59)

## 2023-09-27 NOTE — Progress Notes (Signed)
 Established patient visit   Patient: Mark Terry.   DOB: 1935-04-09   89 y.o. Male  MRN: [id number] Visit Date: 09/23/2023  Today's healthcare provider: Mila Merry, MD   Chief Complaint  Patient presents with   Medical Management of Chronic Issues   Subjective    Discussed the use of AI scribe software for clinical note transcription with the patient, who gave verbal consent to proceed.  History of Present Illness   Mark Sardo. is a 89 year old male with diabetes and hypertension who presents for a follow-up visit.  He has experienced elevated blood sugar levels since receiving a steroid injection a couple of months ago. Initially, his blood sugar rose to around 200 mg/dL and has since stabilized around 169 mg/dL, but he has been unable to lower it to his target of below 130 mg/dL. He is currently taking Jardiance, glimepiride, and metformin for his diabetes. He mentions a high copay for Jardiance, which he resumed after the new year despite the cost.  He experiences dizziness upon waking in the morning, requiring him to sit for a while before standing. He has not had any falls since his last visit. He monitors his blood pressure daily and notes occasional readings lower than expected. He is currently taking losartan 100 mg, metoprolol, and doxazosin, which he receives from a mail-order pharmacy.  He describes issues with his legs, noting that the skin was previously in poor condition but has improved with treatment, although redness persists. He experiences swelling and warmth in his legs and elevates them using a lift chair. He takes furosemide every morning but has difficulty putting on compression stockings due to swelling.       Medications: Outpatient Medications Prior to Visit  Medication Sig   Accu-Chek FastClix Lancets MISC USE TO CHECK BLOOD GLUCOSE AS  DIRECTED   ACCU-CHEK GUIDE test strip CHECK BLOOD SUGAR ONCE DAILY FOR TYPE 2 DIABETES   aspirin  81 MG tablet Take 81 mg by mouth daily.   Blood Glucose Monitoring Suppl (ACCU-CHEK GUIDE) w/Device KIT USE AS DIRECTED DAILY   DULoxetine (CYMBALTA) 30 MG capsule TAKE 1 CAPSULE BY MOUTH EVERY DAY   empagliflozin (JARDIANCE) 25 MG TABS tablet TAKE 1 TABLET BY MOUTH DAILY   fluticasone (FLONASE) 50 MCG/ACT nasal spray USE 1 TO 2 SPRAYS IN BOTH  NOSTRILS DAILY   furosemide (LASIX) 40 MG tablet TAKE 1 TABLET BY MOUTH DAILY   gabapentin (NEURONTIN) 300 MG capsule TAKE 1 CAPSULE BY MOUTH 3 TIMES  DAILY   glimepiride (AMARYL) 1 MG tablet TAKE 1 TABLET BY MOUTH DAILY   HYDROcodone-acetaminophen (NORCO/VICODIN) 5-325 MG tablet TAKE 1 TABLET BY MOUTH TWICE DAILY AS NEEDED   ibuprofen (ADVIL,MOTRIN) 200 MG tablet Take 200 mg by mouth every 6 (six) hours as needed.   Lancets Misc. (ACCU-CHEK FASTCLIX LANCET) KIT Used to check glucose.  DX E11.9   levothyroxine (SYNTHROID) 50 MCG tablet Take 1 tablet (50 mcg total) by mouth daily.   loratadine (CLARITIN) 10 MG tablet Take 10 mg by mouth daily.   losartan (COZAAR) 100 MG tablet TAKE 1 TABLET BY MOUTH DAILY   meloxicam (MOBIC) 15 MG tablet Take 15 mg by mouth daily.   metFORMIN (GLUCOPHAGE-XR) 500 MG 24 hr tablet TAKE 1 TABLET BY MOUTH TWICE  DAILY   metoprolol tartrate (LOPRESSOR) 25 MG tablet TAKE 1 TABLET BY MOUTH TWICE  DAILY   Multiple Vitamin (MULTIVITAMIN WITH MINERALS) TABS Take 1 tablet  by mouth daily.   omeprazole (PRILOSEC) 40 MG capsule TAKE 1 CAPSULE BY MOUTH DAILY   oxybutynin (DITROPAN) 5 MG tablet TAKE 1 TABLET BY MOUTH TWICE  DAILY   potassium chloride SA (KLOR-CON M) 20 MEQ tablet TAKE 1 TABLET BY MOUTH DAILY   simvastatin (ZOCOR) 40 MG tablet TAKE 1 TABLET BY MOUTH AT  BEDTIME   traZODone (DESYREL) 100 MG tablet Take 0.5-1 tablets (50-100 mg total) by mouth at bedtime as needed. for sleep   Vitamin D, Ergocalciferol, (DRISDOL) 1.25 MG (50000 UNIT) CAPS capsule Take 50,000 Units by mouth every 7 (seven) days.   doxazosin (CARDURA) 2 MG  tablet TAKE 1 TABLET BY MOUTH  DAILY   No facility-administered medications prior to visit.   Review of Systems  Constitutional:  Negative for appetite change, chills and fever.  Respiratory:  Negative for chest tightness, shortness of breath and wheezing.   Cardiovascular:  Negative for chest pain and palpitations.  Gastrointestinal:  Negative for abdominal pain, nausea and vomiting.       Objective    BP (!) 110/57 (BP Location: Left Arm, Patient Position: Sitting, Cuff Size: Large)   Pulse 70   Resp 16   Ht 5\' 1"  (1.549 m)   Wt 214 lb (97.1 kg) Comment: at home  SpO2 97%   BMI 40.43 kg/m   Physical Exam   General: Appearance:    Obese male in no acute distress  Eyes:    PERRL, conjunctiva/corneas clear, EOM's intact       Lungs:     Clear to auscultation bilaterally, respirations unlabored  Heart:    Normal heart rate. Normal rhythm. No murmurs, rubs, or gallops.    Extremeties:   All extremities are intact.  2+ ankle and foot edema. Mild erythema and warm to touch across anterior lower leg.   Neurologic:   Awake, alert, oriented x 3. No apparent focal neurological defect.            Assessment & Plan       Diabetes Mellitus Blood sugar elevated post-steroid injection. Current reading 169 mg/dL. Jardiance cost issue resolved post-deductible. - Order A1c test. - Continue Jardiance, glimepiride, metformin.  Hypertension Blood pressure well-controlled but possibly too low. Doxazosin may cause orthostatic hypotension. Losartan increased to 100 mg, monitor renal function and electrolytes. - Reduce doxazosin to 1 mg, send prescription to [facility]. - Order labs for kidney function and potassium.  Peripheral Edema with Suspected Cellulitis Leg swelling and redness suggest cellulitis. Furosemide dose increase needed for fluid management. - Double furosemide for 5-6 days. - Prescribe cephalexin for one week. - Advise leg elevation and compression stockings if swelling  decreases.  -Call if symptoms change or if not rapidly improving.      Return in about 4 months (around 01/23/2024).      Mila Merry, MD  [facility] [phone] (phone) [phone] (fax)  [facility]

## 2023-09-30 DIAGNOSIS — M5126 Other intervertebral disc displacement, lumbar region: Secondary | ICD-10-CM | POA: Diagnosis not present

## 2023-09-30 DIAGNOSIS — M5412 Radiculopathy, cervical region: Secondary | ICD-10-CM | POA: Diagnosis not present

## 2023-09-30 DIAGNOSIS — M47816 Spondylosis without myelopathy or radiculopathy, lumbar region: Secondary | ICD-10-CM | POA: Diagnosis not present

## 2023-09-30 DIAGNOSIS — M5416 Radiculopathy, lumbar region: Secondary | ICD-10-CM | POA: Diagnosis not present

## 2023-09-30 DIAGNOSIS — M4802 Spinal stenosis, cervical region: Secondary | ICD-10-CM | POA: Diagnosis not present

## 2023-09-30 DIAGNOSIS — M62830 Muscle spasm of back: Secondary | ICD-10-CM | POA: Diagnosis not present

## 2023-09-30 DIAGNOSIS — M48062 Spinal stenosis, lumbar region with neurogenic claudication: Secondary | ICD-10-CM | POA: Diagnosis not present

## 2023-10-03 DIAGNOSIS — M5416 Radiculopathy, lumbar region: Secondary | ICD-10-CM | POA: Diagnosis not present

## 2023-10-03 DIAGNOSIS — M48062 Spinal stenosis, lumbar region with neurogenic claudication: Secondary | ICD-10-CM | POA: Diagnosis not present

## 2023-10-07 ENCOUNTER — Other Ambulatory Visit: Payer: Self-pay | Admitting: Family Medicine

## 2023-10-07 ENCOUNTER — Telehealth: Payer: Self-pay | Admitting: Family Medicine

## 2023-10-07 NOTE — Telephone Encounter (Signed)
 Patient called and he says that he got the potassium question straight after he called this morning. He says that he had the bottle turned upside down and couldn't remember why, then he remembered Dr. Sherrie Mustache told him to stop taking it. I confirmed the result notes on 09/26/23 noted to stop the potassium and f/u in 6 weeks. Advised to remove the bottle from the other medications, he says he has put it in the cabinet out of the box of other bottles that he takes. He doesn't need a refill.

## 2023-10-07 NOTE — Telephone Encounter (Signed)
 Patient called, left VM to return the call to the office to let us know which pharmacy to send this medication to.  Copied from CRM [id number]. Topic: Clinical - Medication Question >> Oct 07, 2023  9:41 AM [name] wrote: Reason for CRM: The patient has called to verify the status of their potassium chloride SA (KLOR-CON M) 20 MEQ tablet [[id number]] prescription   Please contact when possible

## 2023-10-08 ENCOUNTER — Other Ambulatory Visit: Payer: Self-pay | Admitting: Family Medicine

## 2023-10-16 ENCOUNTER — Telehealth: Payer: Self-pay

## 2023-10-16 ENCOUNTER — Ambulatory Visit: Payer: Self-pay

## 2023-10-16 DIAGNOSIS — R300 Dysuria: Secondary | ICD-10-CM

## 2023-10-16 NOTE — Telephone Encounter (Unsigned)
 Copied from CRM [id number]. Topic: Clinical - Medication Question >> Oct 16, 2023  9:11 AM Gery Pray wrote: Reason for CRM: Patient called and stated he has a UTI and would like the provider to send an antibiotic to his pharmacy. Patient is unable to come into visit due lack of transportation. Please contact patient at [phone].  [facility] - [place], [place] - [address] [address] Phone: [phone] Fax: [phone] Hours: Not open 24 hours

## 2023-10-16 NOTE — Telephone Encounter (Signed)
 Chief Complaint: Increased frequency, increased incontinence, burning with urination Symptoms: see above Frequency: since yesterday Pertinent Negatives: Patient denies fever Disposition: [] ED /[] Urgent Care (no appt availability in office) / [x] Appointment(In office/virtual)/ []  [facility]/ [] Home Care/ [x] Refused Recommended Disposition /[]  Mobile Bus/ [x]  Follow-up with PCP Additional Notes: Patient calling asking if Dr. Sherrie Mustache could call him in medication for UTI, stating he has had one in the last 6 months and feels the same symptoms as though he has another one now. Patient states he has had burning with urination, increased frequency, and increased difficulty holding his bladder since yesterday. Patient states he does not know how to work computers and cannot do any type of virtual visit, and cannot get a ride to the office unless he gives someone a week heads up. Patient is asking "can someone call me back and let me know if Dr. Sherrie Mustache can call me in something?" Please advise.   Copied from CRM [id number]. Topic: Clinical - Medication Question >> Oct 16, 2023  9:11 AM Gery Pray wrote: Reason for CRM: Patient called and stated he has a UTI and would like the provider to send an antibiotic to his pharmacy. Patient is unable to come into visit due lack of transportation. Please contact patient at [phone].  [facility] - [place], [place] - [address] [place] [address] Phone: [phone] Fax: [phone] Hours: Not open 24 hours >> Oct 16, 2023  3:28 PM Nyra Capes wrote: Patient calling on one has called him back about UTI medication.  Reason for Disposition  Urinating more frequently than usual (i.e., frequency)  Answer Assessment - Initial Assessment Questions 1. SYMPTOM: "What's the main symptom you're concerned about?" (e.g., frequency, incontinence)     Incontinence, burning, increased frequency 2. ONSET: "When did the symptoms  start?"     Yesterday 3. PAIN: "Is there any pain?" If Yes, ask: "How bad is it?" (Scale: 1-10; mild, moderate, severe)     Burning with urination 4. CAUSE: "What do you think is causing the symptoms?"     UTI 5. OTHER SYMPTOMS: "Do you have any other symptoms?" (e.g., blood in urine, fever, flank pain, pain with urination)     Pain with urination  Protocols used: Urinary Symptoms-A-AH

## 2023-10-17 ENCOUNTER — Other Ambulatory Visit: Payer: Self-pay | Admitting: Family Medicine

## 2023-10-17 DIAGNOSIS — R300 Dysuria: Secondary | ICD-10-CM

## 2023-10-17 MED ORDER — CEFDINIR 300 MG PO CAPS
600.0000 mg | ORAL_CAPSULE | Freq: Every day | ORAL | 0 refills | Status: DC
Start: 2023-10-17 — End: 2023-10-17

## 2023-10-17 MED ORDER — CEFDINIR 300 MG PO CAPS
600.0000 mg | ORAL_CAPSULE | Freq: Every day | ORAL | 0 refills | Status: DC
Start: 2023-10-17 — End: 2023-11-08

## 2023-10-17 NOTE — Telephone Encounter (Signed)
 [facility] If not resolved when finished then he needs to be seen in the office.

## 2023-10-17 NOTE — Telephone Encounter (Signed)
 Called patient to advised. Patient states prescription not at the pharmacy. Noticed prescription didn't go through. Dr.Pardue resent prescription.

## 2023-10-17 NOTE — Addendum Note (Signed)
 Addended by: Marjie Skiff on: 10/17/2023 09:10 AM   Modules accepted: Orders

## 2023-10-17 NOTE — Addendum Note (Signed)
 Addended by: Malva Limes on: 10/17/2023 08:03 AM   Modules accepted: Orders

## 2023-10-21 ENCOUNTER — Ambulatory Visit: Payer: Self-pay

## 2023-10-21 NOTE — Telephone Encounter (Signed)
 Recommend ED or urgent care.

## 2023-10-21 NOTE — Telephone Encounter (Addendum)
 Chief Complaint: urinary incontinence  Symptoms: incontinence, weakness Frequency: incontinence for 1 wk, progressive and worsening weakness Pertinent Negatives: Patient denies fever, N/V/D, bleeding, CP, SOB, dizziness Disposition: [x] ED /[] Urgent Care (no appt availability in office) / [] Appointment(In office/virtual)/ []  [facility]/ [] Home Care/ [x] Refused Recommended Disposition /[] [place] [facility]/ []  Follow-up with PCP Additional Notes: Pt initially reports urinary incontinence. Pt recently took cefdinir for a UTI and states he finished the last one today. Pt states his dysuria has resolved but he has urinary incontinence. Pt states he is wearing a pad but it gets full quickly. Pt states by the time he realizes he has to urinate, he is already in the process of urinating. Then, pt endorsed weakness. Pt states "I don't know why I am so weak today. I couldn't put my clothes on." Pt states he was so weak he was unable to put his pants back on after using the bathroom. Pt took his vital signs today and states his BP is 114/62 which he says is low for him. HR was 78 and BG was 193. Pt endorses he likely has not been drinking enough fluids lately. Pt states he is urinating normally and denies dizziness. Pt states he uses a wheelchair at home and states that he is able to stand or walk "a little bit" with a Garlock. Pt states he has been weak lately but today is much worse. Pt denies flank pain, hematuria, CP, SOB, fever, nausea, abd pain, vomiting, diarrhea, rectal bleeding. To address pt's weakness RN advised pt should go to the ED. Pt declined, states he is going to "drink some cranberry juice and see how he feels." RN advised pt RN would relay his symptoms to the office for follow-up. RN also called the [facility]. RN advised pt that if he develops worsening he needs to call 911. Pt verbalized understanding.  Pt also cited transportation issues during the conversation. Pt stated he has someone  that helps him part-time that may be able to give him a ride to an appt this afternoon or tomorrow afternoon, but that he would have to call that person and ask first.    Copied from CRM [id number]. Topic: Clinical - Medication Question >> Oct 21, 2023 11:28 AM [place] J wrote: Reason for CRM: Patient finished medication that was needed to help with UTI but now the patient can't hold his bladder. He was wondering if there's another medication he can try? Please call patient with an update. Reason for Disposition  [1] MODERATE weakness (i.e., interferes with work, school, normal activities) AND [2] cause unknown  (Exceptions: Weakness from acute minor illness or poor fluid intake; weakness is chronic and not worse.)  [1] Drinking very little AND [2] dehydration suspected (e.g., no urine > 12 hours, very dry mouth, very lightheaded)    Pt denies lightheadedness but endorses weakness.  Answer Assessment - Initial Assessment Questions 1. DESCRIPTION: "Describe how you are feeling."     So weak he can't put his clothes on, having difficulty putting his pants back on  2. SEVERITY: "How bad is it?"  "Can you stand and walk?"   - MILD (0-3): Feels weak or tired, but does not interfere with work, school or normal activities.   - MODERATE (4-7): Able to stand and walk; weakness interferes with work, school, or normal activities.   - SEVERE (8-10): Unable to stand or walk; unable to do usual activities.     Pt states he had difficulty getting dressed and  it is hard for him to put his pants back on after using the bathroom 3. ONSET: "When did these symptoms begin?" (e.g., hours, days, weeks, months)     States weakness today is worse than it usually is 4. CAUSE: "What do you think is causing the weakness or fatigue?" (e.g., not drinking enough fluids, medical problem, trouble sleeping)     Possibly because he has not been drinking enough, no new medications other than antibiotic he took for UTI (finished last  one today, cefdinir) 5. NEW MEDICINES:  "Have you started on any new medicines recently?" (e.g., opioid pain medicines, benzodiazepines, muscle relaxants, antidepressants, antihistamines, neuroleptics, beta blockers)     No 6. OTHER SYMPTOMS: "Do you have any other symptoms?" (e.g., chest pain, fever, cough, SOB, vomiting, diarrhea, bleeding, other areas of pain)     Denies CP or SOB. Denies fever. Denies hematuria. Denies fever. States dysuria improved with antibiotics. Endorses chronic pain and takes narcotics. Denies N/V/D. Denies bleeding.   Pt states "it may be because I haven't been drinking enough." Pt denies lightheadedness or dizziness. States he is urinating normally.  BG 193 this AM, BP 114/62 (states he feels this is low for him), HR 78  Answer Assessment - Initial Assessment Questions 1. SYMPTOM: "What's the main symptom you're concerned about?" (e.g., frequency, incontinence)     Urinary incontinence 2. ONSET: "When did the incontinence start?"     For 1 wk 3. PAIN: "Is there any pain?" If Yes, ask: "How bad is it?" (Scale: 1-10; mild, moderate, severe)     No 4. CAUSE: "What do you think is causing the symptoms?"     Not sure  5. OTHER SYMPTOMS: "Do you have any other symptoms?" (e.g., blood in urine, fever, flank pain, pain with urination)     Pt states "I have pain all the time" in his back, pt states he goes to the pain clinic and takes narcotics. Denies hematuria. Denies fever. States his dysuria has resolved after antibiotics. Endorses weakness - "states I can hardly put my clothes on." Pt states his weakness has gotten a lot worse today. Pt states he can "walk a little bit with a Minter but only a very little bit." Denies SOB. Denies CP. Denies one-sided weakness. Denies aphasia.    Pt states he wears a pad "all the time", states the pad does not hold his urine  Protocols used: Urinary Symptoms-A-AH, Weakness (Generalized) and Fatigue-A-AH

## 2023-10-22 NOTE — Telephone Encounter (Signed)
 Called and spoke with patient. Reports that he is feeling better today. He just not able to control his bladder. Offered patient an appointment. Patient reports that he does not have transportation and is hard for him to get one. He will if he starts to feel bad. Reports that he finished the antibiotic yesterda.

## 2023-10-23 DIAGNOSIS — R350 Frequency of micturition: Secondary | ICD-10-CM | POA: Diagnosis not present

## 2023-10-23 DIAGNOSIS — N390 Urinary tract infection, site not specified: Secondary | ICD-10-CM | POA: Diagnosis not present

## 2023-10-23 DIAGNOSIS — Z8639 Personal history of other endocrine, nutritional and metabolic disease: Secondary | ICD-10-CM | POA: Diagnosis not present

## 2023-10-23 DIAGNOSIS — N1832 Chronic kidney disease, stage 3b: Secondary | ICD-10-CM | POA: Diagnosis not present

## 2023-11-04 ENCOUNTER — Other Ambulatory Visit: Payer: Self-pay | Admitting: Family Medicine

## 2023-11-04 DIAGNOSIS — N3941 Urge incontinence: Secondary | ICD-10-CM

## 2023-11-08 ENCOUNTER — Encounter: Payer: Self-pay | Admitting: Family Medicine

## 2023-11-08 ENCOUNTER — Ambulatory Visit (INDEPENDENT_AMBULATORY_CARE_PROVIDER_SITE_OTHER): Admitting: Family Medicine

## 2023-11-08 VITALS — BP 109/47 | HR 76 | Resp 16 | Wt 219.0 lb

## 2023-11-08 DIAGNOSIS — E7800 Pure hypercholesterolemia, unspecified: Secondary | ICD-10-CM

## 2023-11-08 DIAGNOSIS — R3911 Hesitancy of micturition: Secondary | ICD-10-CM | POA: Diagnosis not present

## 2023-11-08 DIAGNOSIS — N1832 Chronic kidney disease, stage 3b: Secondary | ICD-10-CM

## 2023-11-08 DIAGNOSIS — R600 Localized edema: Secondary | ICD-10-CM

## 2023-11-08 DIAGNOSIS — R42 Dizziness and giddiness: Secondary | ICD-10-CM | POA: Diagnosis not present

## 2023-11-08 DIAGNOSIS — I10 Essential (primary) hypertension: Secondary | ICD-10-CM

## 2023-11-08 DIAGNOSIS — D649 Anemia, unspecified: Secondary | ICD-10-CM

## 2023-11-08 MED ORDER — ALFUZOSIN HCL ER 10 MG PO TB24: 10.0000 mg | ORAL_TABLET | Freq: Every day | ORAL | 0 refills | Status: DC

## 2023-11-08 NOTE — Patient Instructions (Addendum)
 Please review the attached list of medications and notify my office if there are any errors.   STOP doxazosin  and take alfuzosin in its place. I'll send a 90 day prescription to you mail order pharmacy in 2 weeks if you don't any problems with it.  Double up on your furosemide  by taking 2 tablets a day for the next 5-7 days to get rid of the extra fluid in your legs  Elevate you legs at least to the level of you hips whenever you are not walking.

## 2023-11-09 LAB — FE+CBC/D/PLT+TIBC+FER+RETIC
Basophils Absolute: 0 10*3/uL (ref 0.0–0.2)
Basos: 0 %
EOS (ABSOLUTE): 0.1 10*3/uL (ref 0.0–0.4)
Eos: 1 %
Ferritin: 37 ng/mL (ref 30–400)
Hematocrit: 29.1 % — ABNORMAL LOW (ref 37.5–51.0)
Hemoglobin: 9.6 g/dL — ABNORMAL LOW (ref 13.0–17.7)
Immature Grans (Abs): 0.1 10*3/uL (ref 0.0–0.1)
Immature Granulocytes: 2 %
Iron Saturation: 17 % (ref 15–55)
Iron: 58 ug/dL (ref 38–169)
Lymphocytes Absolute: 1.4 10*3/uL (ref 0.7–3.1)
Lymphs: 27 %
MCH: 31.2 pg (ref 26.6–33.0)
MCHC: 33 g/dL (ref 31.5–35.7)
MCV: 95 fL (ref 79–97)
Monocytes Absolute: 0.6 10*3/uL (ref 0.1–0.9)
Monocytes: 12 %
Neutrophils Absolute: 3 10*3/uL (ref 1.4–7.0)
Neutrophils: 58 %
Platelets: 87 10*3/uL — CL (ref 150–450)
RBC: 3.08 x10E6/uL — ABNORMAL LOW (ref 4.14–5.80)
RDW: 14.3 % (ref 11.6–15.4)
Retic Ct Pct: 3.4 % — ABNORMAL HIGH (ref 0.6–2.6)
Total Iron Binding Capacity: 340 ug/dL (ref 250–450)
UIBC: 282 ug/dL (ref 111–343)
WBC: 5.1 10*3/uL (ref 3.4–10.8)

## 2023-11-09 LAB — LIPID PANEL
Chol/HDL Ratio: 2.8 ratio (ref 0.0–5.0)
Cholesterol, Total: 117 mg/dL (ref 100–199)
HDL: 42 mg/dL (ref >39)
LDL Chol Calc (NIH): 50 mg/dL (ref 0–99)
Triglycerides: 148 mg/dL (ref 0–149)
VLDL Cholesterol Cal: 25 mg/dL (ref 5–40)

## 2023-11-09 LAB — VITAMIN D 25 HYDROXY (VIT D DEFICIENCY, FRACTURES): Vit D, 25-Hydroxy: 48.2 ng/mL (ref 30.0–100.0)

## 2023-11-09 LAB — VITAMIN B12: Vitamin B-12: 508 pg/mL (ref 232–1245)

## 2023-11-11 ENCOUNTER — Ambulatory Visit: Admitting: Family Medicine

## 2023-11-12 ENCOUNTER — Encounter: Payer: Self-pay | Admitting: Family Medicine

## 2023-11-13 ENCOUNTER — Telehealth: Payer: Self-pay

## 2023-11-13 NOTE — Telephone Encounter (Signed)
 Called patient back and advised the 55 mg is the same as the 325 mg.

## 2023-11-13 NOTE — Telephone Encounter (Signed)
 Copied from CRM [id number]. Topic: Clinical - Medication Question >> Nov 13, 2023  4:27 PM Bridgette Campus T wrote: Reason for CRM: Mark Terry calling - Patient was told to take iron supplement 325 mg, can only find 65 mg tablets, need to know where to find the 325 mg- please call  [phone]

## 2023-11-22 ENCOUNTER — Other Ambulatory Visit: Payer: Self-pay | Admitting: Family Medicine

## 2023-11-22 DIAGNOSIS — R3911 Hesitancy of micturition: Secondary | ICD-10-CM

## 2023-11-22 MED ORDER — ALFUZOSIN HCL ER 10 MG PO TB24
10.0000 mg | ORAL_TABLET | Freq: Every day | ORAL | 3 refills | Status: DC
Start: 2023-11-22 — End: 2024-05-21

## 2023-11-24 ENCOUNTER — Other Ambulatory Visit: Payer: Self-pay | Admitting: Family Medicine

## 2023-11-24 DIAGNOSIS — M5412 Radiculopathy, cervical region: Secondary | ICD-10-CM

## 2023-11-25 DIAGNOSIS — L570 Actinic keratosis: Secondary | ICD-10-CM | POA: Diagnosis not present

## 2023-11-25 DIAGNOSIS — Z872 Personal history of diseases of the skin and subcutaneous tissue: Secondary | ICD-10-CM | POA: Diagnosis not present

## 2023-11-25 DIAGNOSIS — Z85828 Personal history of other malignant neoplasm of skin: Secondary | ICD-10-CM | POA: Diagnosis not present

## 2023-11-25 DIAGNOSIS — D0462 Carcinoma in situ of skin of left upper limb, including shoulder: Secondary | ICD-10-CM | POA: Diagnosis not present

## 2023-11-25 DIAGNOSIS — D485 Neoplasm of uncertain behavior of skin: Secondary | ICD-10-CM | POA: Diagnosis not present

## 2023-11-25 DIAGNOSIS — Z859 Personal history of malignant neoplasm, unspecified: Secondary | ICD-10-CM | POA: Diagnosis not present

## 2023-11-25 DIAGNOSIS — L578 Other skin changes due to chronic exposure to nonionizing radiation: Secondary | ICD-10-CM | POA: Diagnosis not present

## 2023-11-27 DIAGNOSIS — R319 Hematuria, unspecified: Secondary | ICD-10-CM | POA: Diagnosis not present

## 2023-11-27 DIAGNOSIS — E785 Hyperlipidemia, unspecified: Secondary | ICD-10-CM | POA: Diagnosis not present

## 2023-11-27 DIAGNOSIS — R8290 Unspecified abnormal findings in urine: Secondary | ICD-10-CM | POA: Diagnosis not present

## 2023-11-27 DIAGNOSIS — N1832 Chronic kidney disease, stage 3b: Secondary | ICD-10-CM | POA: Diagnosis not present

## 2023-11-27 DIAGNOSIS — E876 Hypokalemia: Secondary | ICD-10-CM | POA: Diagnosis not present

## 2023-11-27 DIAGNOSIS — I509 Heart failure, unspecified: Secondary | ICD-10-CM | POA: Diagnosis not present

## 2023-11-27 DIAGNOSIS — R809 Proteinuria, unspecified: Secondary | ICD-10-CM | POA: Diagnosis not present

## 2023-11-28 ENCOUNTER — Other Ambulatory Visit: Payer: Self-pay | Admitting: Nephrology

## 2023-11-28 DIAGNOSIS — R8290 Unspecified abnormal findings in urine: Secondary | ICD-10-CM

## 2023-11-28 DIAGNOSIS — R319 Hematuria, unspecified: Secondary | ICD-10-CM

## 2023-11-28 DIAGNOSIS — I509 Heart failure, unspecified: Secondary | ICD-10-CM

## 2023-12-01 NOTE — Progress Notes (Signed)
 Established patient visit   Patient: Mark Terry.   DOB: 03-28-1935   89 y.o. Male  MRN: [id number] Visit Date: 11/08/2023  Today's healthcare provider: Jeralene Mom, MD   Chief Complaint  Patient presents with   Medical Management of Chronic Issues    6 weeks follow-up   Subjective    Discussed the use of AI scribe software for clinical note transcription with the patient, who gave verbal consent to proceed.  History of Present Illness   Mark Terry. is an 89 year old male who presents with leg swelling and difficulty urinating.  He has been experiencing leg swelling and fluid seepage despite being on antibiotics. The swelling is due to fluid buildup in his legs. He attempts to keep his legs elevated, but finds it challenging to elevate them to the level of his heart. He is currently taking 40 mg of Lasix  (furosemide ) daily, but notes a decrease in urine output and difficulty in bladder emptying.  His sleep is disrupted, as he went to bed at 7:00 PM and was up every hour from 10:30 PM to 2:00 AM, after which he slept until 7:30 AM. This issue has worsened recently. His blood pressure medication, doxazosin , was previously reduced due to low blood pressure, and he continues to experience low blood pressure with dizziness when sitting up, requiring a pause before standing.  His blood sugar levels have been improving, with a reading of 100 today and generally around 120 or lower, though it was 169 and 147 on a couple of days last week. He mentions a little congestion but no trouble with breathing or shortness of breath.  He is due for have lab work done today to check his iron levels, B12, and folic acid, as there is a concern about anemia possibly related to kidney disease.  He has appt scheduled at [facility] on the 21st.       Medications: Outpatient Medications Prior to Visit  Medication Sig Note   Accu-Chek FastClix Lancets MISC USE TO CHECK BLOOD GLUCOSE  AS  DIRECTED    ACCU-CHEK GUIDE test strip CHECK BLOOD SUGAR ONCE DAILY FOR TYPE 2 DIABETES    aspirin  81 MG tablet Take 81 mg by mouth daily.    Blood Glucose Monitoring Suppl (ACCU-CHEK GUIDE) w/Device KIT USE AS DIRECTED DAILY    DULoxetine  (CYMBALTA ) 30 MG capsule TAKE 1 CAPSULE BY MOUTH EVERY DAY    empagliflozin  (JARDIANCE ) 25 MG TABS tablet TAKE 1 TABLET BY MOUTH DAILY    fluticasone  (FLONASE ) 50 MCG/ACT nasal spray USE 1 TO 2 SPRAYS IN BOTH  NOSTRILS DAILY    furosemide  (LASIX ) 40 MG tablet TAKE 1 TABLET BY MOUTH DAILY    glimepiride  (AMARYL ) 1 MG tablet TAKE 1 TABLET BY MOUTH DAILY    HYDROcodone -acetaminophen  (NORCO/VICODIN) 5-325 MG tablet TAKE 1 TABLET BY MOUTH TWICE DAILY AS NEEDED    ibuprofen (ADVIL,MOTRIN) 200 MG tablet Take 200 mg by mouth every 6 (six) hours as needed.    Lancets Misc. (ACCU-CHEK FASTCLIX LANCET) KIT Used to check glucose.  DX E11.9    levothyroxine  (SYNTHROID ) 50 MCG tablet Take 1 tablet (50 mcg total) by mouth daily.    loratadine  (CLARITIN ) 10 MG tablet Take 10 mg by mouth daily.    losartan  (COZAAR ) 100 MG tablet TAKE 1 TABLET BY MOUTH DAILY    meloxicam  (MOBIC ) 15 MG tablet Take 15 mg by mouth daily.    metFORMIN  (GLUCOPHAGE -XR) 500 MG  24 hr tablet TAKE 1 TABLET BY MOUTH TWICE  DAILY    metoprolol  tartrate (LOPRESSOR ) 25 MG tablet TAKE 1 TABLET BY MOUTH TWICE  DAILY    Multiple Vitamin (MULTIVITAMIN WITH MINERALS) TABS Take 1 tablet by mouth daily.    omeprazole  (PRILOSEC) 40 MG capsule TAKE 1 CAPSULE BY MOUTH DAILY    oxybutynin  (DITROPAN ) 5 MG tablet TAKE 1 TABLET BY MOUTH TWICE  DAILY    potassium chloride  SA (KLOR-CON  M) 20 MEQ tablet TAKE 1 TABLET BY MOUTH DAILY    simvastatin  (ZOCOR ) 40 MG tablet TAKE 1 TABLET BY MOUTH AT  BEDTIME    Vitamin D , Ergocalciferol , (DRISDOL) 1.25 MG (50000 UNIT) CAPS capsule Take 50,000 Units by mouth every 7 (seven) days.    [DISCONTINUED] doxazosin  (CARDURA ) 1 MG tablet Take 1 tablet (1 mg total) by mouth daily.  11/08/2023: changed to alfluzosin due to hypotension   gabapentin  (NEURONTIN ) 300 MG capsule TAKE 1 CAPSULE BY MOUTH 3 TIMES  DAILY    traZODone  (DESYREL ) 100 MG tablet Take 0.5-1 tablets (50-100 mg total) by mouth at bedtime as needed. for sleep    No facility-administered medications prior to visit.       Objective    BP (!) 109/47 (BP Location: Left Arm, Patient Position: Sitting, Cuff Size: Large)   Pulse 76   Resp 16   Wt 219 lb (99.3 kg)   SpO2 96%   BMI 41.38 kg/m   Physical Exam   General: Appearance:    Severely obese male in no acute distress  Eyes:    PERRL, conjunctiva/corneas clear, EOM's intact       Lungs:     Clear to auscultation bilaterally, respirations unlabored  Heart:    Normal heart rate. Normal rhythm. No murmurs, rubs, or gallops.    MS:   All extremities are intact.  2+ bilateral leg edema with minimal weeping. No erythema.   Neurologic:   Awake, alert, oriented x 3. No apparent focal neurological defect.         Assessment & Plan        Edema in legs Edema with fluid accumulation, risk of infection if unmanaged. Current furosemide  40 mg is inadequate. - Increase furosemide  dosage for five days to reduce edema. - Advise leg elevation above hip level to aid fluid reduction.  Anemia possibly related to kidney disease Anemia likely related to kidney disease, not iron deficiency. Blood cell counts decreasing. Further investigation required. - Order lab tests for B12, iron levels, and folic acid to determine anemia cause.  Urinary retention Urinary retention despite being on doxazosin , previously reduced due to hypotension. Plan to switch to a medication with less hypotensive effect to improve bladder function. - Discontinue doxazosin . - Prescribe uroxatral  to improve bladder function, send one-month supply to [facility] and 90-day supply to mail order pharmacy.  Low blood pressure with history of hypertension Persists despite reducing doxazosin . Plan  to switch to uroxatral  to minimize hypotensive impact while addressing urinary retention.  Dizziness Dizziness upon sitting up, likely related to hypotension. Managed by gradual position changes. - Monitor blood pressure and dizziness with medication change.   Hyperlipidemia check lipids, doing well on simvastatin     Return in about 6 weeks (around 12/20/2023) for Hypertension.      Jeralene Mom, MD  [facility] [phone] (phone) [phone] (fax)  [facility]

## 2023-12-05 DIAGNOSIS — D0462 Carcinoma in situ of skin of left upper limb, including shoulder: Secondary | ICD-10-CM | POA: Diagnosis not present

## 2023-12-06 ENCOUNTER — Ambulatory Visit
Admission: RE | Admit: 2023-12-06 | Discharge: 2023-12-06 | Disposition: A | Discharge status: Home / Self Care | Source: Ambulatory Visit | Attending: Nephrology | Admitting: Nephrology

## 2023-12-06 ENCOUNTER — Telehealth: Payer: Self-pay | Admitting: Family Medicine

## 2023-12-06 DIAGNOSIS — I509 Heart failure, unspecified: Secondary | ICD-10-CM | POA: Insufficient documentation

## 2023-12-06 DIAGNOSIS — R8290 Unspecified abnormal findings in urine: Secondary | ICD-10-CM | POA: Diagnosis not present

## 2023-12-06 DIAGNOSIS — R296 Repeated falls: Secondary | ICD-10-CM

## 2023-12-06 DIAGNOSIS — R319 Hematuria, unspecified: Secondary | ICD-10-CM | POA: Insufficient documentation

## 2023-12-06 DIAGNOSIS — R531 Weakness: Secondary | ICD-10-CM

## 2023-12-06 DIAGNOSIS — C44629 Squamous cell carcinoma of skin of left upper limb, including shoulder: Secondary | ICD-10-CM | POA: Diagnosis not present

## 2023-12-06 DIAGNOSIS — R809 Proteinuria, unspecified: Secondary | ICD-10-CM | POA: Diagnosis not present

## 2023-12-06 NOTE — Telephone Encounter (Signed)
 Patient's daughter, Edwina Gram, came by to tell you that Mark Terry fell this morning but didn't get hurt.  However the nurse at [facility] wants you to order PT for him due to his weakness to help strengthen him.     [name]'s call back # is [phone]

## 2023-12-09 DIAGNOSIS — B351 Tinea unguium: Secondary | ICD-10-CM | POA: Diagnosis not present

## 2023-12-09 DIAGNOSIS — M62830 Muscle spasm of back: Secondary | ICD-10-CM | POA: Diagnosis not present

## 2023-12-09 DIAGNOSIS — M48062 Spinal stenosis, lumbar region with neurogenic claudication: Secondary | ICD-10-CM | POA: Diagnosis not present

## 2023-12-09 DIAGNOSIS — M5412 Radiculopathy, cervical region: Secondary | ICD-10-CM | POA: Diagnosis not present

## 2023-12-09 DIAGNOSIS — E119 Type 2 diabetes mellitus without complications: Secondary | ICD-10-CM | POA: Diagnosis not present

## 2023-12-09 DIAGNOSIS — M5416 Radiculopathy, lumbar region: Secondary | ICD-10-CM | POA: Diagnosis not present

## 2023-12-09 DIAGNOSIS — M4802 Spinal stenosis, cervical region: Secondary | ICD-10-CM | POA: Diagnosis not present

## 2023-12-09 DIAGNOSIS — L851 Acquired keratosis [keratoderma] palmaris et plantaris: Secondary | ICD-10-CM | POA: Diagnosis not present

## 2023-12-09 NOTE — Telephone Encounter (Signed)
 Mark Terry patients daughter called to see if she could stop by to pick up the order for PT per patient had a fall on last week and they are really wanting to start the PT asap. Please f/u with daughter

## 2023-12-10 NOTE — Telephone Encounter (Signed)
 Called daughter Monta Anton, no answer. Left message to call back.

## 2023-12-10 NOTE — Telephone Encounter (Signed)
 Order was completed 6/2 and sent to medical records to fax.

## 2023-12-11 NOTE — Telephone Encounter (Signed)
 Patient daughter called back asked Sheryle Donning front desk to check with medical records about the order.

## 2023-12-13 ENCOUNTER — Telehealth: Payer: Self-pay

## 2023-12-13 DIAGNOSIS — I5032 Chronic diastolic (congestive) heart failure: Secondary | ICD-10-CM | POA: Diagnosis not present

## 2023-12-13 DIAGNOSIS — G4733 Obstructive sleep apnea (adult) (pediatric): Secondary | ICD-10-CM | POA: Diagnosis not present

## 2023-12-13 DIAGNOSIS — Z96611 Presence of right artificial shoulder joint: Secondary | ICD-10-CM | POA: Diagnosis not present

## 2023-12-13 DIAGNOSIS — Z9181 History of falling: Secondary | ICD-10-CM | POA: Diagnosis not present

## 2023-12-13 DIAGNOSIS — I130 Hypertensive heart and chronic kidney disease with heart failure and stage 1 through stage 4 chronic kidney disease, or unspecified chronic kidney disease: Secondary | ICD-10-CM | POA: Diagnosis not present

## 2023-12-13 DIAGNOSIS — Z96612 Presence of left artificial shoulder joint: Secondary | ICD-10-CM | POA: Diagnosis not present

## 2023-12-13 DIAGNOSIS — E1122 Type 2 diabetes mellitus with diabetic chronic kidney disease: Secondary | ICD-10-CM | POA: Diagnosis not present

## 2023-12-13 DIAGNOSIS — Z79891 Long term (current) use of opiate analgesic: Secondary | ICD-10-CM | POA: Diagnosis not present

## 2023-12-13 DIAGNOSIS — K219 Gastro-esophageal reflux disease without esophagitis: Secondary | ICD-10-CM | POA: Diagnosis not present

## 2023-12-13 DIAGNOSIS — Z87891 Personal history of nicotine dependence: Secondary | ICD-10-CM | POA: Diagnosis not present

## 2023-12-13 DIAGNOSIS — Z85828 Personal history of other malignant neoplasm of skin: Secondary | ICD-10-CM | POA: Diagnosis not present

## 2023-12-13 DIAGNOSIS — Z7984 Long term (current) use of oral hypoglycemic drugs: Secondary | ICD-10-CM | POA: Diagnosis not present

## 2023-12-13 DIAGNOSIS — R339 Retention of urine, unspecified: Secondary | ICD-10-CM | POA: Diagnosis not present

## 2023-12-13 DIAGNOSIS — Z981 Arthrodesis status: Secondary | ICD-10-CM | POA: Diagnosis not present

## 2023-12-13 DIAGNOSIS — D631 Anemia in chronic kidney disease: Secondary | ICD-10-CM | POA: Diagnosis not present

## 2023-12-13 DIAGNOSIS — Z96641 Presence of right artificial hip joint: Secondary | ICD-10-CM | POA: Diagnosis not present

## 2023-12-13 DIAGNOSIS — K760 Fatty (change of) liver, not elsewhere classified: Secondary | ICD-10-CM | POA: Diagnosis not present

## 2023-12-13 DIAGNOSIS — E7800 Pure hypercholesterolemia, unspecified: Secondary | ICD-10-CM | POA: Diagnosis not present

## 2023-12-13 DIAGNOSIS — M48062 Spinal stenosis, lumbar region with neurogenic claudication: Secondary | ICD-10-CM | POA: Diagnosis not present

## 2023-12-13 DIAGNOSIS — N183 Chronic kidney disease, stage 3 unspecified: Secondary | ICD-10-CM | POA: Diagnosis not present

## 2023-12-13 DIAGNOSIS — J449 Chronic obstructive pulmonary disease, unspecified: Secondary | ICD-10-CM | POA: Diagnosis not present

## 2023-12-13 DIAGNOSIS — Z951 Presence of aortocoronary bypass graft: Secondary | ICD-10-CM | POA: Diagnosis not present

## 2023-12-13 DIAGNOSIS — I25119 Atherosclerotic heart disease of native coronary artery with unspecified angina pectoris: Secondary | ICD-10-CM | POA: Diagnosis not present

## 2023-12-13 NOTE — Telephone Encounter (Signed)
 Called and was able to inform the pt of the message per Dr F he stated he understood

## 2023-12-13 NOTE — Telephone Encounter (Signed)
 Ok, for PT. Ok to take the Septra  antibiotic that was prescribed by Dr. Lamount Pimple

## 2023-12-13 NOTE — Telephone Encounter (Signed)
 Called and spoke to the pt daughter, she stated the PT order was sent, she is waiting for the company to call, no further action needed at this time

## 2023-12-13 NOTE — Telephone Encounter (Signed)
 Copied from CRM [id number]. Topic: Clinical - Home Health Verbal Orders >> Dec 13, 2023 12:34 PM Zipporah Him wrote: Caller/Agency: Wilnette Haste Number: [phone] - secure vm Service Requested: Physical Therapy Frequency: 1w1, 2w2, 1w2, 1 every 2 wk 4. Any new concerns about the patient? Yes, patient was placed on an antibiotic at clinic, in the med list it states it has a level 2 interaction with medication losartan . Also, patient has an incision where cancer was removed from his left forearm, she states it is very red but is not warm or anything. Just wanted to note this.

## 2023-12-17 DIAGNOSIS — Z96611 Presence of right artificial shoulder joint: Secondary | ICD-10-CM | POA: Diagnosis not present

## 2023-12-17 DIAGNOSIS — K219 Gastro-esophageal reflux disease without esophagitis: Secondary | ICD-10-CM | POA: Diagnosis not present

## 2023-12-17 DIAGNOSIS — Z7984 Long term (current) use of oral hypoglycemic drugs: Secondary | ICD-10-CM | POA: Diagnosis not present

## 2023-12-17 DIAGNOSIS — Z96641 Presence of right artificial hip joint: Secondary | ICD-10-CM | POA: Diagnosis not present

## 2023-12-17 DIAGNOSIS — I25119 Atherosclerotic heart disease of native coronary artery with unspecified angina pectoris: Secondary | ICD-10-CM | POA: Diagnosis not present

## 2023-12-17 DIAGNOSIS — K760 Fatty (change of) liver, not elsewhere classified: Secondary | ICD-10-CM | POA: Diagnosis not present

## 2023-12-17 DIAGNOSIS — Z85828 Personal history of other malignant neoplasm of skin: Secondary | ICD-10-CM | POA: Diagnosis not present

## 2023-12-17 DIAGNOSIS — N183 Chronic kidney disease, stage 3 unspecified: Secondary | ICD-10-CM | POA: Diagnosis not present

## 2023-12-17 DIAGNOSIS — I130 Hypertensive heart and chronic kidney disease with heart failure and stage 1 through stage 4 chronic kidney disease, or unspecified chronic kidney disease: Secondary | ICD-10-CM | POA: Diagnosis not present

## 2023-12-17 DIAGNOSIS — Z951 Presence of aortocoronary bypass graft: Secondary | ICD-10-CM | POA: Diagnosis not present

## 2023-12-17 DIAGNOSIS — E7800 Pure hypercholesterolemia, unspecified: Secondary | ICD-10-CM | POA: Diagnosis not present

## 2023-12-17 DIAGNOSIS — G4733 Obstructive sleep apnea (adult) (pediatric): Secondary | ICD-10-CM | POA: Diagnosis not present

## 2023-12-17 DIAGNOSIS — Z96612 Presence of left artificial shoulder joint: Secondary | ICD-10-CM | POA: Diagnosis not present

## 2023-12-17 DIAGNOSIS — J449 Chronic obstructive pulmonary disease, unspecified: Secondary | ICD-10-CM | POA: Diagnosis not present

## 2023-12-17 DIAGNOSIS — Z981 Arthrodesis status: Secondary | ICD-10-CM | POA: Diagnosis not present

## 2023-12-17 DIAGNOSIS — I5032 Chronic diastolic (congestive) heart failure: Secondary | ICD-10-CM | POA: Diagnosis not present

## 2023-12-17 DIAGNOSIS — D631 Anemia in chronic kidney disease: Secondary | ICD-10-CM | POA: Diagnosis not present

## 2023-12-17 DIAGNOSIS — M48062 Spinal stenosis, lumbar region with neurogenic claudication: Secondary | ICD-10-CM | POA: Diagnosis not present

## 2023-12-17 DIAGNOSIS — Z9181 History of falling: Secondary | ICD-10-CM | POA: Diagnosis not present

## 2023-12-17 DIAGNOSIS — E1122 Type 2 diabetes mellitus with diabetic chronic kidney disease: Secondary | ICD-10-CM | POA: Diagnosis not present

## 2023-12-17 DIAGNOSIS — R339 Retention of urine, unspecified: Secondary | ICD-10-CM | POA: Diagnosis not present

## 2023-12-17 DIAGNOSIS — Z79891 Long term (current) use of opiate analgesic: Secondary | ICD-10-CM | POA: Diagnosis not present

## 2023-12-17 DIAGNOSIS — Z87891 Personal history of nicotine dependence: Secondary | ICD-10-CM | POA: Diagnosis not present

## 2023-12-19 DIAGNOSIS — I25119 Atherosclerotic heart disease of native coronary artery with unspecified angina pectoris: Secondary | ICD-10-CM | POA: Diagnosis not present

## 2023-12-19 DIAGNOSIS — Z96611 Presence of right artificial shoulder joint: Secondary | ICD-10-CM | POA: Diagnosis not present

## 2023-12-19 DIAGNOSIS — K760 Fatty (change of) liver, not elsewhere classified: Secondary | ICD-10-CM | POA: Diagnosis not present

## 2023-12-19 DIAGNOSIS — G4733 Obstructive sleep apnea (adult) (pediatric): Secondary | ICD-10-CM | POA: Diagnosis not present

## 2023-12-19 DIAGNOSIS — E1122 Type 2 diabetes mellitus with diabetic chronic kidney disease: Secondary | ICD-10-CM | POA: Diagnosis not present

## 2023-12-19 DIAGNOSIS — J449 Chronic obstructive pulmonary disease, unspecified: Secondary | ICD-10-CM | POA: Diagnosis not present

## 2023-12-19 DIAGNOSIS — I130 Hypertensive heart and chronic kidney disease with heart failure and stage 1 through stage 4 chronic kidney disease, or unspecified chronic kidney disease: Secondary | ICD-10-CM | POA: Diagnosis not present

## 2023-12-19 DIAGNOSIS — Z981 Arthrodesis status: Secondary | ICD-10-CM | POA: Diagnosis not present

## 2023-12-19 DIAGNOSIS — I5032 Chronic diastolic (congestive) heart failure: Secondary | ICD-10-CM | POA: Diagnosis not present

## 2023-12-19 DIAGNOSIS — E7800 Pure hypercholesterolemia, unspecified: Secondary | ICD-10-CM | POA: Diagnosis not present

## 2023-12-19 DIAGNOSIS — Z79891 Long term (current) use of opiate analgesic: Secondary | ICD-10-CM | POA: Diagnosis not present

## 2023-12-19 DIAGNOSIS — Z951 Presence of aortocoronary bypass graft: Secondary | ICD-10-CM | POA: Diagnosis not present

## 2023-12-19 DIAGNOSIS — Z96612 Presence of left artificial shoulder joint: Secondary | ICD-10-CM | POA: Diagnosis not present

## 2023-12-19 DIAGNOSIS — Z96641 Presence of right artificial hip joint: Secondary | ICD-10-CM | POA: Diagnosis not present

## 2023-12-19 DIAGNOSIS — K219 Gastro-esophageal reflux disease without esophagitis: Secondary | ICD-10-CM | POA: Diagnosis not present

## 2023-12-19 DIAGNOSIS — N183 Chronic kidney disease, stage 3 unspecified: Secondary | ICD-10-CM | POA: Diagnosis not present

## 2023-12-19 DIAGNOSIS — M48062 Spinal stenosis, lumbar region with neurogenic claudication: Secondary | ICD-10-CM | POA: Diagnosis not present

## 2023-12-19 DIAGNOSIS — Z87891 Personal history of nicotine dependence: Secondary | ICD-10-CM | POA: Diagnosis not present

## 2023-12-19 DIAGNOSIS — Z7984 Long term (current) use of oral hypoglycemic drugs: Secondary | ICD-10-CM | POA: Diagnosis not present

## 2023-12-19 DIAGNOSIS — Z9181 History of falling: Secondary | ICD-10-CM | POA: Diagnosis not present

## 2023-12-19 DIAGNOSIS — Z85828 Personal history of other malignant neoplasm of skin: Secondary | ICD-10-CM | POA: Diagnosis not present

## 2023-12-19 DIAGNOSIS — R339 Retention of urine, unspecified: Secondary | ICD-10-CM | POA: Diagnosis not present

## 2023-12-19 DIAGNOSIS — D631 Anemia in chronic kidney disease: Secondary | ICD-10-CM | POA: Diagnosis not present

## 2023-12-23 ENCOUNTER — Encounter (INDEPENDENT_AMBULATORY_CARE_PROVIDER_SITE_OTHER): Admitting: Vascular Surgery

## 2023-12-23 DIAGNOSIS — Z951 Presence of aortocoronary bypass graft: Secondary | ICD-10-CM | POA: Diagnosis not present

## 2023-12-23 DIAGNOSIS — E7800 Pure hypercholesterolemia, unspecified: Secondary | ICD-10-CM | POA: Diagnosis not present

## 2023-12-23 DIAGNOSIS — N1832 Chronic kidney disease, stage 3b: Secondary | ICD-10-CM | POA: Diagnosis not present

## 2023-12-23 DIAGNOSIS — I10 Essential (primary) hypertension: Secondary | ICD-10-CM | POA: Diagnosis not present

## 2023-12-23 DIAGNOSIS — R0602 Shortness of breath: Secondary | ICD-10-CM | POA: Diagnosis not present

## 2023-12-23 DIAGNOSIS — I25118 Atherosclerotic heart disease of native coronary artery with other forms of angina pectoris: Secondary | ICD-10-CM | POA: Diagnosis not present

## 2023-12-23 DIAGNOSIS — R600 Localized edema: Secondary | ICD-10-CM | POA: Diagnosis not present

## 2023-12-23 DIAGNOSIS — E119 Type 2 diabetes mellitus without complications: Secondary | ICD-10-CM | POA: Diagnosis not present

## 2023-12-23 DIAGNOSIS — I5032 Chronic diastolic (congestive) heart failure: Secondary | ICD-10-CM | POA: Diagnosis not present

## 2023-12-23 DIAGNOSIS — J449 Chronic obstructive pulmonary disease, unspecified: Secondary | ICD-10-CM | POA: Diagnosis not present

## 2023-12-23 DIAGNOSIS — G4733 Obstructive sleep apnea (adult) (pediatric): Secondary | ICD-10-CM | POA: Diagnosis not present

## 2023-12-24 DIAGNOSIS — Z7984 Long term (current) use of oral hypoglycemic drugs: Secondary | ICD-10-CM | POA: Diagnosis not present

## 2023-12-24 DIAGNOSIS — I130 Hypertensive heart and chronic kidney disease with heart failure and stage 1 through stage 4 chronic kidney disease, or unspecified chronic kidney disease: Secondary | ICD-10-CM | POA: Diagnosis not present

## 2023-12-24 DIAGNOSIS — Z9181 History of falling: Secondary | ICD-10-CM | POA: Diagnosis not present

## 2023-12-24 DIAGNOSIS — J449 Chronic obstructive pulmonary disease, unspecified: Secondary | ICD-10-CM | POA: Diagnosis not present

## 2023-12-24 DIAGNOSIS — K219 Gastro-esophageal reflux disease without esophagitis: Secondary | ICD-10-CM | POA: Diagnosis not present

## 2023-12-24 DIAGNOSIS — K760 Fatty (change of) liver, not elsewhere classified: Secondary | ICD-10-CM | POA: Diagnosis not present

## 2023-12-24 DIAGNOSIS — Z79891 Long term (current) use of opiate analgesic: Secondary | ICD-10-CM | POA: Diagnosis not present

## 2023-12-24 DIAGNOSIS — Z96611 Presence of right artificial shoulder joint: Secondary | ICD-10-CM | POA: Diagnosis not present

## 2023-12-24 DIAGNOSIS — I25119 Atherosclerotic heart disease of native coronary artery with unspecified angina pectoris: Secondary | ICD-10-CM | POA: Diagnosis not present

## 2023-12-24 DIAGNOSIS — Z96612 Presence of left artificial shoulder joint: Secondary | ICD-10-CM | POA: Diagnosis not present

## 2023-12-24 DIAGNOSIS — M48062 Spinal stenosis, lumbar region with neurogenic claudication: Secondary | ICD-10-CM | POA: Diagnosis not present

## 2023-12-24 DIAGNOSIS — Z87891 Personal history of nicotine dependence: Secondary | ICD-10-CM | POA: Diagnosis not present

## 2023-12-24 DIAGNOSIS — I5032 Chronic diastolic (congestive) heart failure: Secondary | ICD-10-CM | POA: Diagnosis not present

## 2023-12-24 DIAGNOSIS — E7800 Pure hypercholesterolemia, unspecified: Secondary | ICD-10-CM | POA: Diagnosis not present

## 2023-12-24 DIAGNOSIS — N183 Chronic kidney disease, stage 3 unspecified: Secondary | ICD-10-CM | POA: Diagnosis not present

## 2023-12-24 DIAGNOSIS — Z96641 Presence of right artificial hip joint: Secondary | ICD-10-CM | POA: Diagnosis not present

## 2023-12-24 DIAGNOSIS — E1122 Type 2 diabetes mellitus with diabetic chronic kidney disease: Secondary | ICD-10-CM | POA: Diagnosis not present

## 2023-12-24 DIAGNOSIS — Z951 Presence of aortocoronary bypass graft: Secondary | ICD-10-CM | POA: Diagnosis not present

## 2023-12-24 DIAGNOSIS — Z85828 Personal history of other malignant neoplasm of skin: Secondary | ICD-10-CM | POA: Diagnosis not present

## 2023-12-24 DIAGNOSIS — D631 Anemia in chronic kidney disease: Secondary | ICD-10-CM | POA: Diagnosis not present

## 2023-12-24 DIAGNOSIS — G4733 Obstructive sleep apnea (adult) (pediatric): Secondary | ICD-10-CM | POA: Diagnosis not present

## 2023-12-24 DIAGNOSIS — R339 Retention of urine, unspecified: Secondary | ICD-10-CM | POA: Diagnosis not present

## 2023-12-24 DIAGNOSIS — Z981 Arthrodesis status: Secondary | ICD-10-CM | POA: Diagnosis not present

## 2023-12-25 ENCOUNTER — Ambulatory Visit: Admitting: Family Medicine

## 2023-12-25 DIAGNOSIS — I509 Heart failure, unspecified: Secondary | ICD-10-CM | POA: Diagnosis not present

## 2023-12-25 DIAGNOSIS — R809 Proteinuria, unspecified: Secondary | ICD-10-CM | POA: Diagnosis not present

## 2023-12-25 DIAGNOSIS — E785 Hyperlipidemia, unspecified: Secondary | ICD-10-CM | POA: Diagnosis not present

## 2023-12-25 DIAGNOSIS — N184 Chronic kidney disease, stage 4 (severe): Secondary | ICD-10-CM | POA: Diagnosis not present

## 2023-12-25 DIAGNOSIS — E876 Hypokalemia: Secondary | ICD-10-CM | POA: Diagnosis not present

## 2023-12-25 DIAGNOSIS — R319 Hematuria, unspecified: Secondary | ICD-10-CM | POA: Diagnosis not present

## 2023-12-25 DIAGNOSIS — I129 Hypertensive chronic kidney disease with stage 1 through stage 4 chronic kidney disease, or unspecified chronic kidney disease: Secondary | ICD-10-CM | POA: Diagnosis not present

## 2023-12-26 ENCOUNTER — Encounter (INDEPENDENT_AMBULATORY_CARE_PROVIDER_SITE_OTHER): Admitting: Vascular Surgery

## 2023-12-27 ENCOUNTER — Other Ambulatory Visit: Payer: Self-pay | Admitting: Family Medicine

## 2023-12-27 DIAGNOSIS — D631 Anemia in chronic kidney disease: Secondary | ICD-10-CM | POA: Diagnosis not present

## 2023-12-27 DIAGNOSIS — G4733 Obstructive sleep apnea (adult) (pediatric): Secondary | ICD-10-CM | POA: Diagnosis not present

## 2023-12-27 DIAGNOSIS — Z79891 Long term (current) use of opiate analgesic: Secondary | ICD-10-CM | POA: Diagnosis not present

## 2023-12-27 DIAGNOSIS — R339 Retention of urine, unspecified: Secondary | ICD-10-CM | POA: Diagnosis not present

## 2023-12-27 DIAGNOSIS — Z96612 Presence of left artificial shoulder joint: Secondary | ICD-10-CM | POA: Diagnosis not present

## 2023-12-27 DIAGNOSIS — I130 Hypertensive heart and chronic kidney disease with heart failure and stage 1 through stage 4 chronic kidney disease, or unspecified chronic kidney disease: Secondary | ICD-10-CM | POA: Diagnosis not present

## 2023-12-27 DIAGNOSIS — J449 Chronic obstructive pulmonary disease, unspecified: Secondary | ICD-10-CM | POA: Diagnosis not present

## 2023-12-27 DIAGNOSIS — E1122 Type 2 diabetes mellitus with diabetic chronic kidney disease: Secondary | ICD-10-CM | POA: Diagnosis not present

## 2023-12-27 DIAGNOSIS — I25119 Atherosclerotic heart disease of native coronary artery with unspecified angina pectoris: Secondary | ICD-10-CM | POA: Diagnosis not present

## 2023-12-27 DIAGNOSIS — Z96611 Presence of right artificial shoulder joint: Secondary | ICD-10-CM | POA: Diagnosis not present

## 2023-12-27 DIAGNOSIS — I5032 Chronic diastolic (congestive) heart failure: Secondary | ICD-10-CM | POA: Diagnosis not present

## 2023-12-27 DIAGNOSIS — E7800 Pure hypercholesterolemia, unspecified: Secondary | ICD-10-CM | POA: Diagnosis not present

## 2023-12-27 DIAGNOSIS — K219 Gastro-esophageal reflux disease without esophagitis: Secondary | ICD-10-CM | POA: Diagnosis not present

## 2023-12-27 DIAGNOSIS — N183 Chronic kidney disease, stage 3 unspecified: Secondary | ICD-10-CM | POA: Diagnosis not present

## 2023-12-27 DIAGNOSIS — Z96641 Presence of right artificial hip joint: Secondary | ICD-10-CM | POA: Diagnosis not present

## 2023-12-27 DIAGNOSIS — Z85828 Personal history of other malignant neoplasm of skin: Secondary | ICD-10-CM | POA: Diagnosis not present

## 2023-12-27 DIAGNOSIS — Z9181 History of falling: Secondary | ICD-10-CM | POA: Diagnosis not present

## 2023-12-27 DIAGNOSIS — Z87891 Personal history of nicotine dependence: Secondary | ICD-10-CM | POA: Diagnosis not present

## 2023-12-27 DIAGNOSIS — Z951 Presence of aortocoronary bypass graft: Secondary | ICD-10-CM | POA: Diagnosis not present

## 2023-12-27 DIAGNOSIS — E039 Hypothyroidism, unspecified: Secondary | ICD-10-CM

## 2023-12-27 DIAGNOSIS — K760 Fatty (change of) liver, not elsewhere classified: Secondary | ICD-10-CM | POA: Diagnosis not present

## 2023-12-27 DIAGNOSIS — M48062 Spinal stenosis, lumbar region with neurogenic claudication: Secondary | ICD-10-CM | POA: Diagnosis not present

## 2023-12-27 DIAGNOSIS — Z7984 Long term (current) use of oral hypoglycemic drugs: Secondary | ICD-10-CM | POA: Diagnosis not present

## 2023-12-27 DIAGNOSIS — Z981 Arthrodesis status: Secondary | ICD-10-CM | POA: Diagnosis not present

## 2023-12-30 ENCOUNTER — Ambulatory Visit (INDEPENDENT_AMBULATORY_CARE_PROVIDER_SITE_OTHER): Admitting: Vascular Surgery

## 2023-12-30 ENCOUNTER — Encounter (INDEPENDENT_AMBULATORY_CARE_PROVIDER_SITE_OTHER): Payer: Self-pay | Admitting: Vascular Surgery

## 2023-12-30 VITALS — BP 101/54 | HR 54 | Ht 61.0 in

## 2023-12-30 DIAGNOSIS — I998 Other disorder of circulatory system: Secondary | ICD-10-CM | POA: Diagnosis not present

## 2023-12-30 DIAGNOSIS — I5032 Chronic diastolic (congestive) heart failure: Secondary | ICD-10-CM

## 2023-12-30 DIAGNOSIS — I10 Essential (primary) hypertension: Secondary | ICD-10-CM

## 2023-12-30 DIAGNOSIS — E1121 Type 2 diabetes mellitus with diabetic nephropathy: Secondary | ICD-10-CM

## 2023-12-30 NOTE — Progress Notes (Signed)
 Subjective:    Patient ID: Mark LELON Vannie Mickey., male    DOB: 1935-03-14, 89 y.o.   MRN: [id number] Chief Complaint  Patient presents with   np. consult. leg edema + redness+ blistering    Mark Terry is an 89 year old male who presents to clinic today with chief complaint of bilateral lower extremity swelling.  According to his daughter who is at his side today this has not been an ongoing problem over the past year.  She has been told in the past that this is related to his congestive heart failure as well as chronic kidney disease stage IV.  He recently was seen by his primary care physician, nephrologist and cardiologist who will recommend bilateral lower extremity wraps for his legs most likely indefinitely.  He has been placed on antibiotics for some cellulitis to his lower extremities just last week.  He is wheelchair-bound and does not walk anymore.  He is also morbidly obese at this time.  Patient did undergo ultrasound back in April 2025 showing arterial duplex waveforms that were triphasic and normal ABIs of 1 or greater.  He presents today with what appears to be venous vascular insufficiency with some cellulitis and skin tears to his left lower leg.    Review of Systems  Constitutional: Negative.   Respiratory:  Positive for shortness of breath.        History of CHF  Cardiovascular:  Positive for leg swelling.       History of CHF with chronic kidney disease stage IV  Musculoskeletal:  Positive for gait problem.  Skin:  Positive for color change and wound.       Cellulitic changes to his lower extremities being treated with antibiotics.  Right lower extremity skin tear  All other systems reviewed and are negative.      Objective:   Physical Exam Vitals reviewed.  Constitutional:      Appearance: Normal appearance. He is obese.  HENT:     Head: Normocephalic.   Eyes:     Pupils: Pupils are equal, round, and reactive to light.    Cardiovascular:     Rate and  Rhythm: Normal rate and regular rhythm.     Pulses: Normal pulses.     Heart sounds: Normal heart sounds.  Pulmonary:     Effort: Pulmonary effort is normal.     Breath sounds: Rales present.  Abdominal:     General: There is distension.     Palpations: Abdomen is soft.   Musculoskeletal:        General: Swelling and tenderness present.     Right lower leg: Edema present.     Left lower leg: Edema present.   Skin:    General: Skin is warm and dry.     Capillary Refill: Capillary refill takes more than 3 seconds.     Findings: Erythema present.   Neurological:     General: No focal deficit present.     Mental Status: He is alert and oriented to person, place, and time. Mental status is at baseline.     Motor: Weakness present.     Gait: Gait abnormal.     Comments: Patient is primarily wheelchair-bound at this time  Psychiatric:        Mood and Affect: Mood normal.        Behavior: Behavior normal.        Thought Content: Thought content normal.        Judgment: Judgment normal.  BP (!) 101/54   Pulse (!) 54   Ht 5' 1 (1.549 m)   BMI 41.38 kg/m   Past Medical History:  Diagnosis Date   Anemia    Bruises easily    Diabetes mellitus without complication (HCC)    H/O esophagogastroduodenoscopy    H/O hiatal hernia    History of blood transfusion    no reaction noted   History of bronchitis    last time a couple of yrs ago   History of gastric ulcer    History of MRSA infection    Hyperlipidemia    Hypertension    Leg cramps    takes quinine  prn   Melanoma (HCC)    Panic attacks    Pneumonia    hx of last time a couple of yrs ago   PONV (postoperative nausea and vomiting)    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   Diagnosis: Squamous cell Carcinoma Location: Mid superior forehead Pre- operative size: 1cm x 1cm Total stages: 1 Post-Operative Size: 2.3cm x 2.1cm Date of Surgery: 05/01/2019    Social History   Socioeconomic History   Marital  status: Divorced    Spouse name: Not on file   Number of children: 2   Years of education: 12   Highest education level: 12th grade  Occupational History   Occupation: Retired  Tobacco Use   Smoking status: Former    Current packs/day: 0.00    Average packs/day: 1 pack/day for 25.0 years (25.0 ttl pk-yrs)    Types: Cigarettes    Start date: 07/10/1943    Quit date: 07/09/1968    Years since quitting: 55.5    Passive exposure: Past   Smokeless tobacco: Never   Tobacco comments:    quit at age 74  Vaping Use   Vaping status: Never Used  Substance and Sexual Activity   Alcohol use: No    Alcohol/week: 0.0 standard drinks of alcohol   Drug use: No   Sexual activity: Never  Other Topics Concern   Not on file  Social History Narrative   Not on file   Social Drivers of Health   Financial Resource Strain: Low Risk  (06/25/2023)   Overall Financial Resource Strain (CARDIA)    Difficulty of Paying Living Expenses: Not very hard  Food Insecurity: No Food Insecurity (06/25/2023)   Hunger Vital Sign    Worried About Running Out of Food in the Last Year: Never true    Ran Out of Food in the Last Year: Never true  Transportation Needs: No Transportation Needs (06/25/2023)   PRAPARE - Administrator, Civil Service (Medical): No    Lack of Transportation (Non-Medical): No  Physical Activity: Sufficiently Active (06/25/2023)   Exercise Vital Sign    Days of Exercise per Week: 3 days    Minutes of Exercise per Session: 60 min  Stress: No Stress Concern Present (06/25/2023)   [employer] of Occupational Health - Occupational Stress Questionnaire    Feeling of Stress : Only a little  Social Connections: Socially Isolated (06/25/2023)   Social Connection and Isolation Panel    Frequency of Communication with Friends and Family: More than three times a week    Frequency of Social Gatherings with Friends and Family: Never    Attends Religious Services: Never    Automotive engineer or Organizations: No    Attends Banker Meetings: Never    Marital Status: Divorced  Catering manager Violence:  Not At Risk (06/25/2023)   Humiliation, Afraid, Rape, and Kick questionnaire    Fear of Current or Ex-Partner: No    Emotionally Abused: No    Physically Abused: No    Sexually Abused: No    Past Surgical History:  Procedure Laterality Date   ANKLE FUSION Right 11/04/2015   Procedure: ARTHRODESIS ANKLE;  Surgeon: Eva Gay, DPM;  Location: [facility] ORS;  Service: Podiatry;  Laterality: Right;   BACK SURGERY     x 4, last lumb. laminectomy Dr. Louis 2008 cervical fusion x 2   bilateral cataract surgery     CHOLECYSTECTOMY  1970   COLONOSCOPY     CORONARY ANGIOPLASTY     CORONARY ARTERY BYPASS GRAFT  2000    3 vessels   ESOPHAGOGASTRODUODENOSCOPY (EGD) WITH PROPOFOL  N/A 11/18/2017   Procedure: ESOPHAGOGASTRODUODENOSCOPY (EGD) WITH PROPOFOL ;  Surgeon: Therisa Bi, MD;  Location: [facility] ENDOSCOPY;  Service: Gastroenterology;  Laterality: N/A;   GANGLION CYST EXCISION Right 10/22/2022   Dr. Kathlynn   JOINT REPLACEMENT     MOHS SURGERY Left 12/30/2018   [facility], Dorn Ahle, MD   NECK SURGERY     x 2   prostate laser surgery     x 2   REPLACEMENT TOTAL KNEE BILATERAL  '86 and 2000   REVERSE SHOULDER ARTHROPLASTY  05/30/2012   Procedure: REVERSE SHOULDER ARTHROPLASTY;  Surgeon: Elspeth JONELLE Her, MD;  Location: [facility] OR;  Service: Orthopedics;  Laterality: Right;  right reverse shoulder arthroplasty   SQUAMOUS CELL CARCINOMA EXCISION  05/01/2019   mid superior forehead/ [facility]/ Dr. Dorn L. Cook   TOTAL HIP ARTHROPLASTY Right 2012   [facility]    Family History  Problem Relation Age of Onset   Alzheimer's disease Mother    Heart attack Father    Bipolar disorder Brother    Kidney disease Neg Hx    Prostate cancer Neg Hx     Allergies  Allergen Reactions   Propoxyphene Anaphylaxis   Codeine Sulfate     Patient denies   Darvon  [Propoxyphene Hcl] Other (See Comments)    Short of breath   Demerol [Meperidine] Other (See Comments)    Short of breath   Oxycodone  Nausea Only       Latest Ref Rng & Units 11/08/2023   10:22 AM 08/01/2022    1:52 PM 06/14/2021   10:43 AM  CBC  WBC 3.4 - 10.8 x10E3/uL 5.1  5.9  8.5   Hemoglobin 13.0 - 17.7 g/dL 9.6  88.7  88.0   Hematocrit 37.5 - 51.0 % 29.1  34.2  37.8   Platelets 150 - 450 x10E3/uL 87  120  163       CMP     Component Value Date/Time   NA 142 09/25/2023 0832   K 5.1 09/25/2023 0832   CL 103 09/25/2023 0832   CO2 22 09/25/2023 0832   GLUCOSE 139 (H) 09/25/2023 0832   GLUCOSE 157 (H) 05/13/2017 0845   BUN 39 (H) 09/25/2023 0832   CREATININE 2.26 (H) 09/25/2023 0832   CREATININE 1.70 (H) 05/13/2017 0845   CALCIUM 9.7 09/25/2023 0832   PROT 6.7 03/04/2023 0901   ALBUMIN 4.1 09/25/2023 0832   AST 28 03/04/2023 0901   ALT 23 03/04/2023 0901   ALKPHOS 81 03/04/2023 0901   BILITOT 0.2 03/04/2023 0901   EGFR 27 (L) 09/25/2023 0832   GFRNONAA 33 (L) 05/30/2020 0924     No results found.     Assessment & Plan:  1. Vascular insufficiency (Primary) Patient presents to clinic today with chief complaint of bilateral lower extremity swelling.  Patient's been seen by both cardiology, nephrology and his primary care who recommended the patient needs his legs wrapped on a regular basis as a means of compression because he is unable to wear compression socks.  I agree with this recommendation.  We plan on wrapping his legs today.  Patient's daughter who is at his side today states she is setting up home health care to visit him through Dr. Gasper his primary care provider.  She endorses she knows he may need to have his legs wrapped indefinitely due to his congestive heart failure and his chronic kidney disease at this time.  She states his legs look much better today since they started him on some spironolactone last week to help with some of the fluid overload.  Due  to the patient's extensive medical history and difficulty getting him to doctors appointments I have not scheduled a return follow-up visit for him today.  I expressed to his daughter we can make this as needed and she can bring him to us  as she feels he needs to be seen.  She states she will follow-up with Dr. Lyle office to confirm home health's ability to follow-up and do Unna boot wraps to his bilateral lower extremities.  2. Essential (primary) hypertension Continue antihypertensive medications as already ordered, these medications have been reviewed and there are no changes at this time.  3. Chronic diastolic congestive heart failure (HCC) Continue congestive heart failure medications as already ordered, these medications have been reviewed and there are no changes at this time.  4. Diabetes mellitus with nephropathy (HCC) Continue hypoglycemic medications as already ordered, these medications have been reviewed and there are no changes at this time.  Hgb A1C to be monitored as already arranged by primary service   Current Outpatient Medications on File Prior to Visit  Medication Sig Dispense Refill   Accu-Chek FastClix Lancets MISC USE TO CHECK BLOOD GLUCOSE AS  DIRECTED 102 each 3   ACCU-CHEK GUIDE test strip CHECK BLOOD SUGAR ONCE DAILY FOR TYPE 2 DIABETES 100 strip 3   alfuzosin  (UROXATRAL ) 10 MG 24 hr tablet Take 1 tablet (10 mg total) by mouth daily with breakfast. Take in place of doxazosin  90 tablet 3   aspirin  81 MG tablet Take 81 mg by mouth daily.     Blood Glucose Monitoring Suppl (ACCU-CHEK GUIDE) w/Device KIT USE AS DIRECTED DAILY 1 kit 0   DULoxetine  (CYMBALTA ) 30 MG capsule TAKE 1 CAPSULE BY MOUTH EVERY DAY 90 capsule 3   empagliflozin  (JARDIANCE ) 25 MG TABS tablet TAKE 1 TABLET BY MOUTH DAILY 90 tablet 4   fluticasone  (FLONASE ) 50 MCG/ACT nasal spray USE 1 TO 2 SPRAYS IN BOTH  NOSTRILS DAILY 48 g 3   furosemide  (LASIX ) 40 MG tablet TAKE 1 TABLET BY MOUTH DAILY 90  tablet 3   gabapentin  (NEURONTIN ) 300 MG capsule TAKE 1 CAPSULE BY MOUTH 3 TIMES  DAILY 270 capsule 3   glimepiride  (AMARYL ) 1 MG tablet TAKE 1 TABLET BY MOUTH DAILY 90 tablet 4   HYDROcodone -acetaminophen  (NORCO/VICODIN) 5-325 MG tablet TAKE 1 TABLET BY MOUTH TWICE DAILY AS NEEDED     ibuprofen (ADVIL,MOTRIN) 200 MG tablet Take 200 mg by mouth every 6 (six) hours as needed.     Lancets Misc. (ACCU-CHEK FASTCLIX LANCET) KIT Used to check glucose.  DX E11.9 1 kit 4   levothyroxine  (SYNTHROID ) 50 MCG tablet  TAKE 1 TABLET BY MOUTH DAILY 90 tablet 3   loratadine  (CLARITIN ) 10 MG tablet Take 10 mg by mouth daily.     losartan  (COZAAR ) 100 MG tablet TAKE 1 TABLET BY MOUTH DAILY 90 tablet 1   meloxicam  (MOBIC ) 15 MG tablet Take 15 mg by mouth daily.     metFORMIN  (GLUCOPHAGE -XR) 500 MG 24 hr tablet TAKE 1 TABLET BY MOUTH TWICE  DAILY 180 tablet 1   metoprolol  tartrate (LOPRESSOR ) 25 MG tablet TAKE 1 TABLET BY MOUTH TWICE  DAILY 180 tablet 4   Multiple Vitamin (MULTIVITAMIN WITH MINERALS) TABS Take 1 tablet by mouth daily.     omeprazole  (PRILOSEC) 40 MG capsule TAKE 1 CAPSULE BY MOUTH DAILY 90 capsule 3   oxybutynin  (DITROPAN ) 5 MG tablet TAKE 1 TABLET BY MOUTH TWICE  DAILY 180 tablet 3   potassium chloride  SA (KLOR-CON  M) 20 MEQ tablet TAKE 1 TABLET BY MOUTH DAILY 90 tablet 3   simvastatin  (ZOCOR ) 40 MG tablet TAKE 1 TABLET BY MOUTH AT  BEDTIME 90 tablet 3   traZODone  (DESYREL ) 100 MG tablet TAKE 1/2 TO 1 TABLET BY MOUTH AT BEDTIME AS NEEDED. FOR SLEEP 90 tablet 3   Vitamin D , Ergocalciferol , (DRISDOL) 1.25 MG (50000 UNIT) CAPS capsule Take 50,000 Units by mouth every 7 (seven) days.     No current facility-administered medications on file prior to visit.    There are no Patient Instructions on file for this visit. No follow-ups on file.   Gwendlyn JONELLE Shank, NP

## 2023-12-31 DIAGNOSIS — E1122 Type 2 diabetes mellitus with diabetic chronic kidney disease: Secondary | ICD-10-CM | POA: Diagnosis not present

## 2023-12-31 DIAGNOSIS — Z9181 History of falling: Secondary | ICD-10-CM | POA: Diagnosis not present

## 2023-12-31 DIAGNOSIS — Z951 Presence of aortocoronary bypass graft: Secondary | ICD-10-CM | POA: Diagnosis not present

## 2023-12-31 DIAGNOSIS — K760 Fatty (change of) liver, not elsewhere classified: Secondary | ICD-10-CM | POA: Diagnosis not present

## 2023-12-31 DIAGNOSIS — J449 Chronic obstructive pulmonary disease, unspecified: Secondary | ICD-10-CM | POA: Diagnosis not present

## 2023-12-31 DIAGNOSIS — Z96641 Presence of right artificial hip joint: Secondary | ICD-10-CM | POA: Diagnosis not present

## 2023-12-31 DIAGNOSIS — Z79891 Long term (current) use of opiate analgesic: Secondary | ICD-10-CM | POA: Diagnosis not present

## 2023-12-31 DIAGNOSIS — N183 Chronic kidney disease, stage 3 unspecified: Secondary | ICD-10-CM | POA: Diagnosis not present

## 2023-12-31 DIAGNOSIS — I5032 Chronic diastolic (congestive) heart failure: Secondary | ICD-10-CM | POA: Diagnosis not present

## 2023-12-31 DIAGNOSIS — E7800 Pure hypercholesterolemia, unspecified: Secondary | ICD-10-CM | POA: Diagnosis not present

## 2023-12-31 DIAGNOSIS — M48062 Spinal stenosis, lumbar region with neurogenic claudication: Secondary | ICD-10-CM | POA: Diagnosis not present

## 2023-12-31 DIAGNOSIS — Z85828 Personal history of other malignant neoplasm of skin: Secondary | ICD-10-CM | POA: Diagnosis not present

## 2023-12-31 DIAGNOSIS — Z96611 Presence of right artificial shoulder joint: Secondary | ICD-10-CM | POA: Diagnosis not present

## 2023-12-31 DIAGNOSIS — Z981 Arthrodesis status: Secondary | ICD-10-CM | POA: Diagnosis not present

## 2023-12-31 DIAGNOSIS — Z96612 Presence of left artificial shoulder joint: Secondary | ICD-10-CM | POA: Diagnosis not present

## 2023-12-31 DIAGNOSIS — Z87891 Personal history of nicotine dependence: Secondary | ICD-10-CM | POA: Diagnosis not present

## 2023-12-31 DIAGNOSIS — D631 Anemia in chronic kidney disease: Secondary | ICD-10-CM | POA: Diagnosis not present

## 2023-12-31 DIAGNOSIS — G4733 Obstructive sleep apnea (adult) (pediatric): Secondary | ICD-10-CM | POA: Diagnosis not present

## 2023-12-31 DIAGNOSIS — K219 Gastro-esophageal reflux disease without esophagitis: Secondary | ICD-10-CM | POA: Diagnosis not present

## 2023-12-31 DIAGNOSIS — R339 Retention of urine, unspecified: Secondary | ICD-10-CM | POA: Diagnosis not present

## 2023-12-31 DIAGNOSIS — I130 Hypertensive heart and chronic kidney disease with heart failure and stage 1 through stage 4 chronic kidney disease, or unspecified chronic kidney disease: Secondary | ICD-10-CM | POA: Diagnosis not present

## 2023-12-31 DIAGNOSIS — Z7984 Long term (current) use of oral hypoglycemic drugs: Secondary | ICD-10-CM | POA: Diagnosis not present

## 2023-12-31 DIAGNOSIS — I25119 Atherosclerotic heart disease of native coronary artery with unspecified angina pectoris: Secondary | ICD-10-CM | POA: Diagnosis not present

## 2024-01-03 ENCOUNTER — Telehealth: Payer: Self-pay

## 2024-01-03 DIAGNOSIS — E7800 Pure hypercholesterolemia, unspecified: Secondary | ICD-10-CM | POA: Diagnosis not present

## 2024-01-03 DIAGNOSIS — K760 Fatty (change of) liver, not elsewhere classified: Secondary | ICD-10-CM | POA: Diagnosis not present

## 2024-01-03 DIAGNOSIS — M48062 Spinal stenosis, lumbar region with neurogenic claudication: Secondary | ICD-10-CM | POA: Diagnosis not present

## 2024-01-03 DIAGNOSIS — K219 Gastro-esophageal reflux disease without esophagitis: Secondary | ICD-10-CM | POA: Diagnosis not present

## 2024-01-03 DIAGNOSIS — I25119 Atherosclerotic heart disease of native coronary artery with unspecified angina pectoris: Secondary | ICD-10-CM | POA: Diagnosis not present

## 2024-01-03 DIAGNOSIS — D631 Anemia in chronic kidney disease: Secondary | ICD-10-CM | POA: Diagnosis not present

## 2024-01-03 DIAGNOSIS — G4733 Obstructive sleep apnea (adult) (pediatric): Secondary | ICD-10-CM | POA: Diagnosis not present

## 2024-01-03 DIAGNOSIS — N183 Chronic kidney disease, stage 3 unspecified: Secondary | ICD-10-CM | POA: Diagnosis not present

## 2024-01-03 DIAGNOSIS — I5032 Chronic diastolic (congestive) heart failure: Secondary | ICD-10-CM | POA: Diagnosis not present

## 2024-01-03 DIAGNOSIS — E1122 Type 2 diabetes mellitus with diabetic chronic kidney disease: Secondary | ICD-10-CM | POA: Diagnosis not present

## 2024-01-03 DIAGNOSIS — I130 Hypertensive heart and chronic kidney disease with heart failure and stage 1 through stage 4 chronic kidney disease, or unspecified chronic kidney disease: Secondary | ICD-10-CM | POA: Diagnosis not present

## 2024-01-03 DIAGNOSIS — J449 Chronic obstructive pulmonary disease, unspecified: Secondary | ICD-10-CM | POA: Diagnosis not present

## 2024-01-03 NOTE — Telephone Encounter (Signed)
 Copied from CRM [id number]. Topic: Clinical - Request for Lab/Test Order >> Jan 03, 2024  9:09 AM Ivette P wrote: Reason for CRM: pt daughter Olam called in about her dad and getting his legs wrapped for the fluid in his legs and having home health order sent to [facility].   Unable to get home health order from specialist because needs to be done through primary because was already ordered through primary before.   [facility]  Phone number [phone]  Fax [phone]   Olam callback number [phone] - would like follow up to know if can be ordered or if cannot

## 2024-01-03 NOTE — Telephone Encounter (Signed)
Verbal order faxed.

## 2024-01-03 NOTE — Telephone Encounter (Signed)
 Ok to send order to [facility] for [place] weekly as needed.

## 2024-01-06 DIAGNOSIS — E7800 Pure hypercholesterolemia, unspecified: Secondary | ICD-10-CM | POA: Diagnosis not present

## 2024-01-06 DIAGNOSIS — Z951 Presence of aortocoronary bypass graft: Secondary | ICD-10-CM | POA: Diagnosis not present

## 2024-01-06 DIAGNOSIS — Z981 Arthrodesis status: Secondary | ICD-10-CM | POA: Diagnosis not present

## 2024-01-06 DIAGNOSIS — I130 Hypertensive heart and chronic kidney disease with heart failure and stage 1 through stage 4 chronic kidney disease, or unspecified chronic kidney disease: Secondary | ICD-10-CM | POA: Diagnosis not present

## 2024-01-06 DIAGNOSIS — I25119 Atherosclerotic heart disease of native coronary artery with unspecified angina pectoris: Secondary | ICD-10-CM | POA: Diagnosis not present

## 2024-01-06 DIAGNOSIS — I5032 Chronic diastolic (congestive) heart failure: Secondary | ICD-10-CM | POA: Diagnosis not present

## 2024-01-06 DIAGNOSIS — K760 Fatty (change of) liver, not elsewhere classified: Secondary | ICD-10-CM | POA: Diagnosis not present

## 2024-01-06 DIAGNOSIS — Z79891 Long term (current) use of opiate analgesic: Secondary | ICD-10-CM | POA: Diagnosis not present

## 2024-01-06 DIAGNOSIS — M48062 Spinal stenosis, lumbar region with neurogenic claudication: Secondary | ICD-10-CM | POA: Diagnosis not present

## 2024-01-06 DIAGNOSIS — E1122 Type 2 diabetes mellitus with diabetic chronic kidney disease: Secondary | ICD-10-CM | POA: Diagnosis not present

## 2024-01-06 DIAGNOSIS — R339 Retention of urine, unspecified: Secondary | ICD-10-CM | POA: Diagnosis not present

## 2024-01-06 DIAGNOSIS — Z85828 Personal history of other malignant neoplasm of skin: Secondary | ICD-10-CM | POA: Diagnosis not present

## 2024-01-06 DIAGNOSIS — Z96641 Presence of right artificial hip joint: Secondary | ICD-10-CM | POA: Diagnosis not present

## 2024-01-06 DIAGNOSIS — J449 Chronic obstructive pulmonary disease, unspecified: Secondary | ICD-10-CM | POA: Diagnosis not present

## 2024-01-06 DIAGNOSIS — Z7984 Long term (current) use of oral hypoglycemic drugs: Secondary | ICD-10-CM | POA: Diagnosis not present

## 2024-01-06 DIAGNOSIS — N183 Chronic kidney disease, stage 3 unspecified: Secondary | ICD-10-CM | POA: Diagnosis not present

## 2024-01-06 DIAGNOSIS — K219 Gastro-esophageal reflux disease without esophagitis: Secondary | ICD-10-CM | POA: Diagnosis not present

## 2024-01-06 DIAGNOSIS — Z87891 Personal history of nicotine dependence: Secondary | ICD-10-CM | POA: Diagnosis not present

## 2024-01-06 DIAGNOSIS — Z9181 History of falling: Secondary | ICD-10-CM | POA: Diagnosis not present

## 2024-01-06 DIAGNOSIS — G4733 Obstructive sleep apnea (adult) (pediatric): Secondary | ICD-10-CM | POA: Diagnosis not present

## 2024-01-06 DIAGNOSIS — Z96611 Presence of right artificial shoulder joint: Secondary | ICD-10-CM | POA: Diagnosis not present

## 2024-01-06 DIAGNOSIS — Z96612 Presence of left artificial shoulder joint: Secondary | ICD-10-CM | POA: Diagnosis not present

## 2024-01-06 DIAGNOSIS — D631 Anemia in chronic kidney disease: Secondary | ICD-10-CM | POA: Diagnosis not present

## 2024-01-07 ENCOUNTER — Ambulatory Visit: Payer: Self-pay

## 2024-01-07 ENCOUNTER — Telehealth (INDEPENDENT_AMBULATORY_CARE_PROVIDER_SITE_OTHER): Payer: Self-pay

## 2024-01-07 DIAGNOSIS — M48062 Spinal stenosis, lumbar region with neurogenic claudication: Secondary | ICD-10-CM | POA: Diagnosis not present

## 2024-01-07 DIAGNOSIS — Z9181 History of falling: Secondary | ICD-10-CM | POA: Diagnosis not present

## 2024-01-07 DIAGNOSIS — N183 Chronic kidney disease, stage 3 unspecified: Secondary | ICD-10-CM | POA: Diagnosis not present

## 2024-01-07 DIAGNOSIS — Z96612 Presence of left artificial shoulder joint: Secondary | ICD-10-CM | POA: Diagnosis not present

## 2024-01-07 DIAGNOSIS — G4733 Obstructive sleep apnea (adult) (pediatric): Secondary | ICD-10-CM | POA: Diagnosis not present

## 2024-01-07 DIAGNOSIS — Z96641 Presence of right artificial hip joint: Secondary | ICD-10-CM | POA: Diagnosis not present

## 2024-01-07 DIAGNOSIS — R339 Retention of urine, unspecified: Secondary | ICD-10-CM | POA: Diagnosis not present

## 2024-01-07 DIAGNOSIS — Z7984 Long term (current) use of oral hypoglycemic drugs: Secondary | ICD-10-CM | POA: Diagnosis not present

## 2024-01-07 DIAGNOSIS — E1122 Type 2 diabetes mellitus with diabetic chronic kidney disease: Secondary | ICD-10-CM | POA: Diagnosis not present

## 2024-01-07 DIAGNOSIS — J449 Chronic obstructive pulmonary disease, unspecified: Secondary | ICD-10-CM | POA: Diagnosis not present

## 2024-01-07 DIAGNOSIS — I25119 Atherosclerotic heart disease of native coronary artery with unspecified angina pectoris: Secondary | ICD-10-CM | POA: Diagnosis not present

## 2024-01-07 DIAGNOSIS — I130 Hypertensive heart and chronic kidney disease with heart failure and stage 1 through stage 4 chronic kidney disease, or unspecified chronic kidney disease: Secondary | ICD-10-CM | POA: Diagnosis not present

## 2024-01-07 DIAGNOSIS — E7800 Pure hypercholesterolemia, unspecified: Secondary | ICD-10-CM | POA: Diagnosis not present

## 2024-01-07 DIAGNOSIS — Z85828 Personal history of other malignant neoplasm of skin: Secondary | ICD-10-CM | POA: Diagnosis not present

## 2024-01-07 DIAGNOSIS — Z951 Presence of aortocoronary bypass graft: Secondary | ICD-10-CM | POA: Diagnosis not present

## 2024-01-07 DIAGNOSIS — D631 Anemia in chronic kidney disease: Secondary | ICD-10-CM | POA: Diagnosis not present

## 2024-01-07 DIAGNOSIS — Z87891 Personal history of nicotine dependence: Secondary | ICD-10-CM | POA: Diagnosis not present

## 2024-01-07 DIAGNOSIS — I5032 Chronic diastolic (congestive) heart failure: Secondary | ICD-10-CM | POA: Diagnosis not present

## 2024-01-07 DIAGNOSIS — Z96611 Presence of right artificial shoulder joint: Secondary | ICD-10-CM | POA: Diagnosis not present

## 2024-01-07 DIAGNOSIS — K760 Fatty (change of) liver, not elsewhere classified: Secondary | ICD-10-CM | POA: Diagnosis not present

## 2024-01-07 DIAGNOSIS — Z981 Arthrodesis status: Secondary | ICD-10-CM | POA: Diagnosis not present

## 2024-01-07 DIAGNOSIS — Z79891 Long term (current) use of opiate analgesic: Secondary | ICD-10-CM | POA: Diagnosis not present

## 2024-01-07 DIAGNOSIS — K219 Gastro-esophageal reflux disease without esophagitis: Secondary | ICD-10-CM | POA: Diagnosis not present

## 2024-01-07 NOTE — Telephone Encounter (Signed)
He can have a sameday slot next week.  

## 2024-01-07 NOTE — Telephone Encounter (Signed)
 FYI Only or Action Required?: Action required by provider: home health called with concerns that patient needs to be seen sooner than 7/21.  Patient was last seen in primary care on 11/08/2023 by Gasper Nancyann BRAVO, MD. Called Nurse Triage reporting Leg Swelling. Symptoms began several weeks ago. Interventions attempted: Rest, hydration, or home remedies and Other: patient finished an antibiotic this past Thursday for cellulitis. Symptoms are: stable.  Triage Disposition: See Physician Within 24 Hours-wanting to a call back-needing to know if he should be seen sooner but transportation is a concern.   Patient/caregiver understands and will follow disposition?: No, wishes to speak with PCP  Copied from CRM [id number]. Topic: Clinical - Red Word Triage >> Jan 07, 2024 10:48 AM DeAngela L wrote: Red Word that prompted transfer to Nurse Triage: Jewel nurse with [facility] home health calling cause the patient has cellulitis concerns  Jewel [phone] Reason for Disposition  [1] MODERATE leg swelling (e.g., swelling extends up to knees) AND [2] new-onset or worsening  Answer Assessment - Initial Assessment Questions 1. ONSET: When did the swelling start? (e.g., minutes, hours, days)     Patient reports being started on an antibiotic by nephrologist for cellulitis 2. LOCATION: What part of the leg is swollen?  Are both legs swollen or just one leg?     Both legs-knee and below is swollen 3. SEVERITY: How bad is the swelling? (e.g., localized; mild, moderate, severe)   - Localized: Small area of swelling localized to one leg.   - MILD pedal edema: Swelling limited to foot and ankle, pitting edema < 1/4 inch (6 mm) deep, rest and elevation eliminate most or all swelling.   - MODERATE edema: Swelling of lower leg to knee, pitting edema > 1/4 inch (6 mm) deep, rest and elevation only partially reduce swelling.   - SEVERE edema: Swelling extends above knee, facial or hand swelling present.       moderate 4. REDNESS: Does the swelling look red or infected?     Swelling is redness 5. PAIN: Is the swelling painful to touch? If Yes, ask: How painful is it?   (Scale 1-10; mild, moderate or severe)     3 out of 10 6. FEVER: Do you have a fever? If Yes, ask: What is it, how was it measured, and when did it start?      no 7. CAUSE: What do you think is causing the leg swelling?     unsure 8. MEDICAL HISTORY: Do you have a history of blood clots (e.g., DVT), cancer, heart failure, kidney disease, or liver failure?     Kidney disease 9. RECURRENT SYMPTOM: Have you had leg swelling before? If Yes, ask: When was the last time? What happened that time?     Patient states he has had swelling before 10. OTHER SYMPTOMS: Do you have any other symptoms? (e.g., chest pain, difficulty breathing)       No  Home Health calling to report continued cellulitis concerns. Patient was started on antibiotic by nephrology due to redness and swelling to lower legs. Swelling is moderate in nature per patient. Home health is concerned that patient needs to be seen sooner than July 21 appointment with Dr. Gasper. Patient does have transportation issues where he depends on other individuals to get him to appointments.  Protocols used: Leg Swelling and Edema-A-AH

## 2024-01-07 NOTE — Telephone Encounter (Addendum)
 Jewel Downing RN from [facility] called stating that the patient's antibiotics are finished and he still has cellulitis and a wound. She wants to know what to do next.   Gwendlyn Shank NP: response to me:  He needs to go to his PCP for eval or the Emergency Room as he may need IV antibiotics and new cultures with consult to infectious disease.  I contacted [facility] and spoke with Demetria and she was given the recommendation from Gwendlyn Shank NP.

## 2024-01-14 DIAGNOSIS — E1122 Type 2 diabetes mellitus with diabetic chronic kidney disease: Secondary | ICD-10-CM | POA: Diagnosis not present

## 2024-01-14 DIAGNOSIS — I130 Hypertensive heart and chronic kidney disease with heart failure and stage 1 through stage 4 chronic kidney disease, or unspecified chronic kidney disease: Secondary | ICD-10-CM | POA: Diagnosis not present

## 2024-01-14 DIAGNOSIS — Z96612 Presence of left artificial shoulder joint: Secondary | ICD-10-CM | POA: Diagnosis not present

## 2024-01-14 DIAGNOSIS — K219 Gastro-esophageal reflux disease without esophagitis: Secondary | ICD-10-CM | POA: Diagnosis not present

## 2024-01-14 DIAGNOSIS — Z96611 Presence of right artificial shoulder joint: Secondary | ICD-10-CM | POA: Diagnosis not present

## 2024-01-14 DIAGNOSIS — K760 Fatty (change of) liver, not elsewhere classified: Secondary | ICD-10-CM | POA: Diagnosis not present

## 2024-01-14 DIAGNOSIS — Z79891 Long term (current) use of opiate analgesic: Secondary | ICD-10-CM | POA: Diagnosis not present

## 2024-01-14 DIAGNOSIS — Z7984 Long term (current) use of oral hypoglycemic drugs: Secondary | ICD-10-CM | POA: Diagnosis not present

## 2024-01-14 DIAGNOSIS — M48062 Spinal stenosis, lumbar region with neurogenic claudication: Secondary | ICD-10-CM | POA: Diagnosis not present

## 2024-01-14 DIAGNOSIS — E7800 Pure hypercholesterolemia, unspecified: Secondary | ICD-10-CM | POA: Diagnosis not present

## 2024-01-14 DIAGNOSIS — Z87891 Personal history of nicotine dependence: Secondary | ICD-10-CM | POA: Diagnosis not present

## 2024-01-14 DIAGNOSIS — I25119 Atherosclerotic heart disease of native coronary artery with unspecified angina pectoris: Secondary | ICD-10-CM | POA: Diagnosis not present

## 2024-01-14 DIAGNOSIS — Z951 Presence of aortocoronary bypass graft: Secondary | ICD-10-CM | POA: Diagnosis not present

## 2024-01-14 DIAGNOSIS — D631 Anemia in chronic kidney disease: Secondary | ICD-10-CM | POA: Diagnosis not present

## 2024-01-14 DIAGNOSIS — J449 Chronic obstructive pulmonary disease, unspecified: Secondary | ICD-10-CM | POA: Diagnosis not present

## 2024-01-14 DIAGNOSIS — Z96641 Presence of right artificial hip joint: Secondary | ICD-10-CM | POA: Diagnosis not present

## 2024-01-14 DIAGNOSIS — Z85828 Personal history of other malignant neoplasm of skin: Secondary | ICD-10-CM | POA: Diagnosis not present

## 2024-01-14 DIAGNOSIS — R339 Retention of urine, unspecified: Secondary | ICD-10-CM | POA: Diagnosis not present

## 2024-01-14 DIAGNOSIS — Z981 Arthrodesis status: Secondary | ICD-10-CM | POA: Diagnosis not present

## 2024-01-14 DIAGNOSIS — N183 Chronic kidney disease, stage 3 unspecified: Secondary | ICD-10-CM | POA: Diagnosis not present

## 2024-01-14 DIAGNOSIS — I5032 Chronic diastolic (congestive) heart failure: Secondary | ICD-10-CM | POA: Diagnosis not present

## 2024-01-14 DIAGNOSIS — Z9181 History of falling: Secondary | ICD-10-CM | POA: Diagnosis not present

## 2024-01-14 DIAGNOSIS — G4733 Obstructive sleep apnea (adult) (pediatric): Secondary | ICD-10-CM | POA: Diagnosis not present

## 2024-01-15 ENCOUNTER — Ambulatory Visit (INDEPENDENT_AMBULATORY_CARE_PROVIDER_SITE_OTHER): Admitting: Family Medicine

## 2024-01-15 ENCOUNTER — Encounter: Payer: Self-pay | Admitting: Family Medicine

## 2024-01-15 VITALS — BP 113/66 | HR 72 | Temp 97.8°F | Wt 209.0 lb

## 2024-01-15 DIAGNOSIS — E1121 Type 2 diabetes mellitus with diabetic nephropathy: Secondary | ICD-10-CM | POA: Diagnosis not present

## 2024-01-15 DIAGNOSIS — N184 Chronic kidney disease, stage 4 (severe): Secondary | ICD-10-CM

## 2024-01-15 DIAGNOSIS — R600 Localized edema: Secondary | ICD-10-CM | POA: Diagnosis not present

## 2024-01-15 DIAGNOSIS — R42 Dizziness and giddiness: Secondary | ICD-10-CM | POA: Diagnosis not present

## 2024-01-15 DIAGNOSIS — L03119 Cellulitis of unspecified part of limb: Secondary | ICD-10-CM | POA: Diagnosis not present

## 2024-01-15 DIAGNOSIS — I959 Hypotension, unspecified: Secondary | ICD-10-CM | POA: Diagnosis not present

## 2024-01-15 DIAGNOSIS — N401 Enlarged prostate with lower urinary tract symptoms: Secondary | ICD-10-CM

## 2024-01-15 LAB — POCT GLYCOSYLATED HEMOGLOBIN (HGB A1C)
Est. average glucose Bld gHb Est-mCnc: 169
Hemoglobin A1C: 7.5 % — AB (ref 4.0–5.6)

## 2024-01-15 MED ORDER — LOSARTAN POTASSIUM 50 MG PO TABS: 50.0000 mg | ORAL_TABLET | Freq: Every day | ORAL | 1 refills | Status: DC

## 2024-01-15 MED ORDER — SULFAMETHOXAZOLE-TRIMETHOPRIM 800-160 MG PO TABS: 1.0000 | ORAL_TABLET | Freq: Two times a day (BID) | ORAL | 0 refills | Status: DC

## 2024-01-15 NOTE — Patient Instructions (Signed)
 Mark Terry  Please review the attached list of medications and notify my office if there are any errors.   . Please bring all of your medications to every appointment so we can make sure that our medication list is the same as yours.

## 2024-01-16 NOTE — Progress Notes (Signed)
 Established patient visit   Patient: Mark Terry.   DOB: 09/07/1934   89 y.o. Male  MRN: [id number] Visit Date: 01/15/2024  Today's healthcare provider: Nancyann Perry, MD   Chief Complaint  Patient presents with   Medical Management of Chronic Issues    HTN follow- up BP reading this morning at home was 98/68   Subjective    Discussed the use of AI scribe software for clinical note transcription with the patient, who gave verbal consent to proceed.  History of Present Illness   Mark Terry. is an 89 year old male with stage 4 kidney failure who presents for follow-up on medication changes and leg swelling.  He experiences swelling in his legs and currently has his leg wrapped once a week. He believes it should be more frequent to accommodate bathing needs. His leg was rewrapped today, and he mentions a persistent spot that is not draining. He has been on Bactrim , an antibiotic, for his legs, prescribed about a month ago. He recalls being on it for a long time and notes it helped significantly, though he is unsure of the exact duration of the previous course but thinks it was around ten days.  He experiences dizziness, particularly when getting up, and tries to sit on the side of the bed to orient himself. Was changed from doxazosin  to alfluzosin at his last visit and reports urination is about the same. He notes his blood pressure is running low for him and has not noticed any significant improvement in dizziness since stopping doxazosin .  His A1c is currently 7.5; previously, it was 7.9. He is concerned about his kidney function, recalling a previous mention of stage 4 kidney failure and a function level of 26%, though he was informed it might be higher.       Medications: Outpatient Medications Prior to Visit  Medication Sig   Accu-Chek FastClix Lancets MISC USE TO CHECK BLOOD GLUCOSE AS  DIRECTED   ACCU-CHEK GUIDE test strip CHECK BLOOD SUGAR ONCE DAILY FOR  TYPE 2 DIABETES   alfuzosin  (UROXATRAL ) 10 MG 24 hr tablet Take 1 tablet (10 mg total) by mouth daily with breakfast. Take in place of doxazosin    aspirin  81 MG tablet Take 81 mg by mouth daily.   Blood Glucose Monitoring Suppl (ACCU-CHEK GUIDE) w/Device KIT USE AS DIRECTED DAILY   DULoxetine  (CYMBALTA ) 30 MG capsule TAKE 1 CAPSULE BY MOUTH EVERY DAY   empagliflozin  (JARDIANCE ) 25 MG TABS tablet TAKE 1 TABLET BY MOUTH DAILY   fluticasone  (FLONASE ) 50 MCG/ACT nasal spray USE 1 TO 2 SPRAYS IN BOTH  NOSTRILS DAILY   furosemide  (LASIX ) 40 MG tablet TAKE 1 TABLET BY MOUTH DAILY   gabapentin  (NEURONTIN ) 300 MG capsule TAKE 1 CAPSULE BY MOUTH 3 TIMES  DAILY   glimepiride  (AMARYL ) 1 MG tablet TAKE 1 TABLET BY MOUTH DAILY   HYDROcodone -acetaminophen  (NORCO/VICODIN) 5-325 MG tablet TAKE 1 TABLET BY MOUTH TWICE DAILY AS NEEDED   ibuprofen (ADVIL,MOTRIN) 200 MG tablet Take 200 mg by mouth every 6 (six) hours as needed.   Lancets Misc. (ACCU-CHEK FASTCLIX LANCET) KIT Used to check glucose.  DX E11.9   levothyroxine  (SYNTHROID ) 50 MCG tablet TAKE 1 TABLET BY MOUTH DAILY   loratadine  (CLARITIN ) 10 MG tablet Take 10 mg by mouth daily.   meloxicam  (MOBIC ) 15 MG tablet Take 15 mg by mouth daily.   metFORMIN  (GLUCOPHAGE -XR) 500 MG 24 hr tablet TAKE 1 TABLET BY MOUTH TWICE  DAILY   metoprolol  tartrate (LOPRESSOR ) 25 MG tablet TAKE 1 TABLET BY MOUTH TWICE  DAILY   Multiple Vitamin (MULTIVITAMIN WITH MINERALS) TABS Take 1 tablet by mouth daily.   omeprazole  (PRILOSEC) 40 MG capsule TAKE 1 CAPSULE BY MOUTH DAILY   oxybutynin  (DITROPAN ) 5 MG tablet TAKE 1 TABLET BY MOUTH TWICE  DAILY   potassium chloride  SA (KLOR-CON  M) 20 MEQ tablet TAKE 1 TABLET BY MOUTH DAILY   simvastatin  (ZOCOR ) 40 MG tablet TAKE 1 TABLET BY MOUTH AT  BEDTIME   traZODone  (DESYREL ) 100 MG tablet TAKE 1/2 TO 1 TABLET BY MOUTH AT BEDTIME AS NEEDED. FOR SLEEP   Vitamin D , Ergocalciferol , (DRISDOL) 1.25 MG (50000 UNIT) CAPS capsule Take 50,000 Units  by mouth every 7 (seven) days.   [DISCONTINUED] losartan  (COZAAR ) 100 MG tablet TAKE 1 TABLET BY MOUTH DAILY   No facility-administered medications prior to visit.   Review of Systems  Constitutional:  Negative for appetite change, chills and fever.  Respiratory:  Negative for chest tightness, shortness of breath and wheezing.   Cardiovascular:  Negative for chest pain and palpitations.  Gastrointestinal:  Negative for abdominal pain, nausea and vomiting.       Objective    BP 113/66 (BP Location: Left Arm, Patient Position: Sitting, Cuff Size: Large)   Pulse 72   Temp 97.8 F (36.6 C)   Wt 209 lb (94.8 kg) Comment: weight at home  SpO2 97%   BMI 39.49 kg/m   Physical Exam   General: Appearance:    Mildly obese male in no acute distress  Eyes:    PERRL, conjunctiva/corneas clear, EOM's intact       Lungs:     Clear to auscultation bilaterally, respirations unlabored  Heart:    Normal heart rate. Normal rhythm. No murmurs, rubs, or gallops.    MS:   All extremities are intact.  3+ bilateral lower leg edema  Neurologic:   Awake, alert, oriented x 3. No apparent focal neurological defect.         Results for orders placed or performed in visit on 01/15/24  POCT glycosylated hemoglobin (Hb A1C)  Result Value Ref Range   Hemoglobin A1C 7.5 (A) 4.0 - 5.6 %   Est. average glucose Bld gHb Est-mCnc 169      Assessment & Plan        Dizziness Dizziness likely due to hypotension, persists despite stopping doxazosin . Fall risk present. Reducing losartan  may help. - Reduce losartan  to 50 mg, monitor dizziness. - Send losartan  50 mg prescription to [facility].  Chronic Venous Insufficiency with Leg Ulcer Leg ulcer with swelling, not draining. Current wrapping once a week, increase suggested. - Consult care team to increase leg wrapping to twice a week.  Chronic Kidney Disease Stage 4 Stage 4 CKD with 30-40% kidney function. Consider routine follow up with  nephrology  Type 2 Diabetes Mellitus A1c improved to 7.5 from 7.9.  Follow-up Follow-up needed for blood pressure, blood sugar, and health monitoring. - Schedule follow-up in a couple of months. - Send Bactrim  refill to [facility].    Return in about 3 months (around 04/16/2024).     Nancyann Perry, MD  [facility] [phone] (phone) [phone] (fax)  [facility]

## 2024-01-20 ENCOUNTER — Telehealth: Payer: Self-pay

## 2024-01-20 ENCOUNTER — Ambulatory Visit: Admitting: Family Medicine

## 2024-01-20 NOTE — Telephone Encounter (Signed)
 I spoke with the daughter and she states that patient got Losartan  25 mg 2 q d.  Patient was on 100 mg and we sent in 50 mg daily.  Patient is on Lopressor  25 mg BID and I have asked the daughter to check and make sure he is not getting these 2 medications mixed upl.  She is to call us  back and let us  know what he had.      Copied from CRM [id number]. Topic: Clinical - Medication Question >> Jan 20, 2024  1:27 PM Berwyn MATSU wrote: Reason for CRM:  Patients daughter called in to clarify the medication losartan  (COZAAR ) 50 MG tablet dosage or how to take medication. Per patients daughter she states that they were supposed to lower medication and now its a high dosage.   Please Myla Ly back daughter   Ph: [phone]  May you please assist.

## 2024-01-21 DIAGNOSIS — Z951 Presence of aortocoronary bypass graft: Secondary | ICD-10-CM | POA: Diagnosis not present

## 2024-01-21 DIAGNOSIS — I130 Hypertensive heart and chronic kidney disease with heart failure and stage 1 through stage 4 chronic kidney disease, or unspecified chronic kidney disease: Secondary | ICD-10-CM | POA: Diagnosis not present

## 2024-01-21 DIAGNOSIS — Z96641 Presence of right artificial hip joint: Secondary | ICD-10-CM | POA: Diagnosis not present

## 2024-01-21 DIAGNOSIS — M48062 Spinal stenosis, lumbar region with neurogenic claudication: Secondary | ICD-10-CM | POA: Diagnosis not present

## 2024-01-21 DIAGNOSIS — Z981 Arthrodesis status: Secondary | ICD-10-CM | POA: Diagnosis not present

## 2024-01-21 DIAGNOSIS — Z96611 Presence of right artificial shoulder joint: Secondary | ICD-10-CM | POA: Diagnosis not present

## 2024-01-21 DIAGNOSIS — J449 Chronic obstructive pulmonary disease, unspecified: Secondary | ICD-10-CM | POA: Diagnosis not present

## 2024-01-21 DIAGNOSIS — E7800 Pure hypercholesterolemia, unspecified: Secondary | ICD-10-CM | POA: Diagnosis not present

## 2024-01-21 DIAGNOSIS — I25119 Atherosclerotic heart disease of native coronary artery with unspecified angina pectoris: Secondary | ICD-10-CM | POA: Diagnosis not present

## 2024-01-21 DIAGNOSIS — G4733 Obstructive sleep apnea (adult) (pediatric): Secondary | ICD-10-CM | POA: Diagnosis not present

## 2024-01-21 DIAGNOSIS — R339 Retention of urine, unspecified: Secondary | ICD-10-CM | POA: Diagnosis not present

## 2024-01-21 DIAGNOSIS — I5032 Chronic diastolic (congestive) heart failure: Secondary | ICD-10-CM | POA: Diagnosis not present

## 2024-01-21 DIAGNOSIS — Z87891 Personal history of nicotine dependence: Secondary | ICD-10-CM | POA: Diagnosis not present

## 2024-01-21 DIAGNOSIS — K219 Gastro-esophageal reflux disease without esophagitis: Secondary | ICD-10-CM | POA: Diagnosis not present

## 2024-01-21 DIAGNOSIS — E1122 Type 2 diabetes mellitus with diabetic chronic kidney disease: Secondary | ICD-10-CM | POA: Diagnosis not present

## 2024-01-21 DIAGNOSIS — Z96612 Presence of left artificial shoulder joint: Secondary | ICD-10-CM | POA: Diagnosis not present

## 2024-01-21 DIAGNOSIS — D631 Anemia in chronic kidney disease: Secondary | ICD-10-CM | POA: Diagnosis not present

## 2024-01-21 DIAGNOSIS — Z79891 Long term (current) use of opiate analgesic: Secondary | ICD-10-CM | POA: Diagnosis not present

## 2024-01-21 DIAGNOSIS — N183 Chronic kidney disease, stage 3 unspecified: Secondary | ICD-10-CM | POA: Diagnosis not present

## 2024-01-21 DIAGNOSIS — Z7984 Long term (current) use of oral hypoglycemic drugs: Secondary | ICD-10-CM | POA: Diagnosis not present

## 2024-01-21 DIAGNOSIS — Z9181 History of falling: Secondary | ICD-10-CM | POA: Diagnosis not present

## 2024-01-21 DIAGNOSIS — K760 Fatty (change of) liver, not elsewhere classified: Secondary | ICD-10-CM | POA: Diagnosis not present

## 2024-01-21 DIAGNOSIS — Z85828 Personal history of other malignant neoplasm of skin: Secondary | ICD-10-CM | POA: Diagnosis not present

## 2024-01-24 ENCOUNTER — Ambulatory Visit: Admitting: Family Medicine

## 2024-01-25 ENCOUNTER — Emergency Department

## 2024-01-25 ENCOUNTER — Inpatient Hospital Stay
Admission: EM | Admit: 2024-01-25 | Discharge: 2024-01-28 | DRG: 871 | Disposition: A | Discharge status: Home Health | Attending: Internal Medicine | Admitting: Internal Medicine

## 2024-01-25 ENCOUNTER — Other Ambulatory Visit: Payer: Self-pay | Admitting: Family Medicine

## 2024-01-25 ENCOUNTER — Inpatient Hospital Stay

## 2024-01-25 ENCOUNTER — Other Ambulatory Visit: Payer: Self-pay

## 2024-01-25 DIAGNOSIS — I959 Hypotension, unspecified: Principal | ICD-10-CM

## 2024-01-25 DIAGNOSIS — J189 Pneumonia, unspecified organism: Secondary | ICD-10-CM | POA: Diagnosis present

## 2024-01-25 DIAGNOSIS — J9601 Acute respiratory failure with hypoxia: Secondary | ICD-10-CM | POA: Diagnosis not present

## 2024-01-25 DIAGNOSIS — Z85820 Personal history of malignant melanoma of skin: Secondary | ICD-10-CM

## 2024-01-25 DIAGNOSIS — R0902 Hypoxemia: Secondary | ICD-10-CM | POA: Diagnosis not present

## 2024-01-25 DIAGNOSIS — R579 Shock, unspecified: Secondary | ICD-10-CM | POA: Diagnosis not present

## 2024-01-25 DIAGNOSIS — N170 Acute kidney failure with tubular necrosis: Secondary | ICD-10-CM | POA: Diagnosis present

## 2024-01-25 DIAGNOSIS — L8990 Pressure ulcer of unspecified site, unspecified stage: Secondary | ICD-10-CM | POA: Insufficient documentation

## 2024-01-25 DIAGNOSIS — A419 Sepsis, unspecified organism: Secondary | ICD-10-CM | POA: Diagnosis not present

## 2024-01-25 DIAGNOSIS — Z7982 Long term (current) use of aspirin: Secondary | ICD-10-CM

## 2024-01-25 DIAGNOSIS — N179 Acute kidney failure, unspecified: Secondary | ICD-10-CM | POA: Diagnosis not present

## 2024-01-25 DIAGNOSIS — L89153 Pressure ulcer of sacral region, stage 3: Secondary | ICD-10-CM | POA: Diagnosis not present

## 2024-01-25 DIAGNOSIS — N184 Chronic kidney disease, stage 4 (severe): Secondary | ICD-10-CM | POA: Diagnosis not present

## 2024-01-25 DIAGNOSIS — Z791 Long term (current) use of non-steroidal anti-inflammatories (NSAID): Secondary | ICD-10-CM

## 2024-01-25 DIAGNOSIS — Z820 Family history of epilepsy and other diseases of the nervous system: Secondary | ICD-10-CM

## 2024-01-25 DIAGNOSIS — E1122 Type 2 diabetes mellitus with diabetic chronic kidney disease: Secondary | ICD-10-CM | POA: Diagnosis not present

## 2024-01-25 DIAGNOSIS — Z951 Presence of aortocoronary bypass graft: Secondary | ICD-10-CM

## 2024-01-25 DIAGNOSIS — R339 Retention of urine, unspecified: Secondary | ICD-10-CM | POA: Diagnosis not present

## 2024-01-25 DIAGNOSIS — Z87891 Personal history of nicotine dependence: Secondary | ICD-10-CM

## 2024-01-25 DIAGNOSIS — G8929 Other chronic pain: Secondary | ICD-10-CM | POA: Diagnosis not present

## 2024-01-25 DIAGNOSIS — K219 Gastro-esophageal reflux disease without esophagitis: Secondary | ICD-10-CM | POA: Diagnosis present

## 2024-01-25 DIAGNOSIS — E785 Hyperlipidemia, unspecified: Secondary | ICD-10-CM | POA: Diagnosis not present

## 2024-01-25 DIAGNOSIS — Z6841 Body Mass Index (BMI) 40.0 and over, adult: Secondary | ICD-10-CM | POA: Diagnosis not present

## 2024-01-25 DIAGNOSIS — Z8614 Personal history of Methicillin resistant Staphylococcus aureus infection: Secondary | ICD-10-CM

## 2024-01-25 DIAGNOSIS — E1151 Type 2 diabetes mellitus with diabetic peripheral angiopathy without gangrene: Secondary | ICD-10-CM | POA: Diagnosis not present

## 2024-01-25 DIAGNOSIS — Z8711 Personal history of peptic ulcer disease: Secondary | ICD-10-CM

## 2024-01-25 DIAGNOSIS — I130 Hypertensive heart and chronic kidney disease with heart failure and stage 1 through stage 4 chronic kidney disease, or unspecified chronic kidney disease: Secondary | ICD-10-CM | POA: Diagnosis present

## 2024-01-25 DIAGNOSIS — Z9689 Presence of other specified functional implants: Secondary | ICD-10-CM | POA: Diagnosis present

## 2024-01-25 DIAGNOSIS — N390 Urinary tract infection, site not specified: Secondary | ICD-10-CM | POA: Diagnosis not present

## 2024-01-25 DIAGNOSIS — I878 Other specified disorders of veins: Secondary | ICD-10-CM | POA: Diagnosis present

## 2024-01-25 DIAGNOSIS — E1140 Type 2 diabetes mellitus with diabetic neuropathy, unspecified: Secondary | ICD-10-CM | POA: Diagnosis present

## 2024-01-25 DIAGNOSIS — J181 Lobar pneumonia, unspecified organism: Secondary | ICD-10-CM | POA: Diagnosis present

## 2024-01-25 DIAGNOSIS — Z96653 Presence of artificial knee joint, bilateral: Secondary | ICD-10-CM | POA: Diagnosis present

## 2024-01-25 DIAGNOSIS — Z1152 Encounter for screening for COVID-19: Secondary | ICD-10-CM

## 2024-01-25 DIAGNOSIS — Z96641 Presence of right artificial hip joint: Secondary | ICD-10-CM | POA: Diagnosis present

## 2024-01-25 DIAGNOSIS — Z818 Family history of other mental and behavioral disorders: Secondary | ICD-10-CM

## 2024-01-25 DIAGNOSIS — J441 Chronic obstructive pulmonary disease with (acute) exacerbation: Secondary | ICD-10-CM | POA: Diagnosis not present

## 2024-01-25 DIAGNOSIS — Z885 Allergy status to narcotic agent status: Secondary | ICD-10-CM

## 2024-01-25 DIAGNOSIS — I5032 Chronic diastolic (congestive) heart failure: Secondary | ICD-10-CM | POA: Diagnosis not present

## 2024-01-25 DIAGNOSIS — R6521 Severe sepsis with septic shock: Secondary | ICD-10-CM | POA: Diagnosis not present

## 2024-01-25 DIAGNOSIS — Z96611 Presence of right artificial shoulder joint: Secondary | ICD-10-CM | POA: Diagnosis present

## 2024-01-25 DIAGNOSIS — D631 Anemia in chronic kidney disease: Secondary | ICD-10-CM | POA: Diagnosis not present

## 2024-01-25 DIAGNOSIS — Z91199 Patient's noncompliance with other medical treatment and regimen due to unspecified reason: Secondary | ICD-10-CM

## 2024-01-25 DIAGNOSIS — N281 Cyst of kidney, acquired: Secondary | ICD-10-CM | POA: Diagnosis not present

## 2024-01-25 DIAGNOSIS — Z79899 Other long term (current) drug therapy: Secondary | ICD-10-CM

## 2024-01-25 DIAGNOSIS — L89616 Pressure-induced deep tissue damage of right heel: Secondary | ICD-10-CM | POA: Diagnosis not present

## 2024-01-25 DIAGNOSIS — Z85828 Personal history of other malignant neoplasm of skin: Secondary | ICD-10-CM

## 2024-01-25 DIAGNOSIS — Z8249 Family history of ischemic heart disease and other diseases of the circulatory system: Secondary | ICD-10-CM

## 2024-01-25 DIAGNOSIS — Z96612 Presence of left artificial shoulder joint: Secondary | ICD-10-CM | POA: Diagnosis not present

## 2024-01-25 DIAGNOSIS — Z981 Arthrodesis status: Secondary | ICD-10-CM

## 2024-01-25 DIAGNOSIS — G4733 Obstructive sleep apnea (adult) (pediatric): Secondary | ICD-10-CM | POA: Diagnosis present

## 2024-01-25 DIAGNOSIS — J440 Chronic obstructive pulmonary disease with acute lower respiratory infection: Secondary | ICD-10-CM | POA: Diagnosis present

## 2024-01-25 DIAGNOSIS — E872 Acidosis, unspecified: Secondary | ICD-10-CM | POA: Diagnosis present

## 2024-01-25 DIAGNOSIS — Z993 Dependence on wheelchair: Secondary | ICD-10-CM

## 2024-01-25 DIAGNOSIS — Z7984 Long term (current) use of oral hypoglycemic drugs: Secondary | ICD-10-CM

## 2024-01-25 DIAGNOSIS — I2510 Atherosclerotic heart disease of native coronary artery without angina pectoris: Secondary | ICD-10-CM | POA: Diagnosis present

## 2024-01-25 DIAGNOSIS — Z888 Allergy status to other drugs, medicaments and biological substances status: Secondary | ICD-10-CM

## 2024-01-25 DIAGNOSIS — Z79890 Hormone replacement therapy (postmenopausal): Secondary | ICD-10-CM

## 2024-01-25 DIAGNOSIS — R0989 Other specified symptoms and signs involving the circulatory and respiratory systems: Secondary | ICD-10-CM | POA: Diagnosis not present

## 2024-01-25 LAB — RESPIRATORY PANEL BY PCR

## 2024-01-25 LAB — CBC WITH DIFFERENTIAL/PLATELET
Abs Immature Granulocytes: 0.08 K/uL — ABNORMAL HIGH (ref 0.00–0.07)
Basophils Absolute: 0 K/uL (ref 0.0–0.1)
Basophils Relative: 0 %
Eosinophils Absolute: 0.1 K/uL (ref 0.0–0.5)
Eosinophils Relative: 1 %
HCT: 31.7 % — ABNORMAL LOW (ref 39.0–52.0)
Hemoglobin: 10.1 g/dL — ABNORMAL LOW (ref 13.0–17.0)
Immature Granulocytes: 1 %
Lymphocytes Relative: 27 %
Lymphs Abs: 1.7 K/uL (ref 0.7–4.0)
MCH: 31.5 pg (ref 26.0–34.0)
MCHC: 31.9 g/dL (ref 30.0–36.0)
MCV: 98.8 fL (ref 80.0–100.0)
Monocytes Absolute: 0.4 K/uL (ref 0.1–1.0)
Monocytes Relative: 7 %
Neutro Abs: 4 K/uL (ref 1.7–7.7)
Neutrophils Relative %: 64 %
Platelets: 107 K/uL — ABNORMAL LOW (ref 150–400)
RBC: 3.21 MIL/uL — ABNORMAL LOW (ref 4.22–5.81)
RDW: 16.2 % — ABNORMAL HIGH (ref 11.5–15.5)
WBC: 6.2 K/uL (ref 4.0–10.5)
nRBC: 0 % (ref 0.0–0.2)

## 2024-01-25 LAB — URINALYSIS, W/ REFLEX TO CULTURE (INFECTION SUSPECTED)
Bacteria, UA: NONE SEEN
Bilirubin Urine: NEGATIVE
Glucose, UA: 150 mg/dL — AB
Hgb urine dipstick: NEGATIVE
Ketones, ur: NEGATIVE mg/dL
Leukocytes,Ua: NEGATIVE
Nitrite: NEGATIVE
Protein, ur: NEGATIVE mg/dL
Specific Gravity, Urine: 1.017 (ref 1.005–1.030)
Squamous Epithelial / HPF: 0 /HPF (ref 0–5)
pH: 5 (ref 5.0–8.0)

## 2024-01-25 LAB — BLOOD GAS, VENOUS
Acid-base deficit: 6.9 mmol/L — ABNORMAL HIGH (ref 0.0–2.0)
Bicarbonate: 20.6 mmol/L (ref 20.0–28.0)
O2 Saturation: 69.8 %
Patient temperature: 37
pCO2, Ven: 48 mmHg (ref 44–60)
pH, Ven: 7.24 — ABNORMAL LOW (ref 7.25–7.43)
pO2, Ven: 46 mmHg — ABNORMAL HIGH (ref 32–45)

## 2024-01-25 LAB — COMPREHENSIVE METABOLIC PANEL WITH GFR
ALT: 23 U/L (ref 0–44)
AST: 24 U/L (ref 15–41)
Albumin: 3.6 g/dL (ref 3.5–5.0)
Alkaline Phosphatase: 61 U/L (ref 38–126)
Anion gap: 14 (ref 5–15)
BUN: 80 mg/dL — ABNORMAL HIGH (ref 8–23)
CO2: 21 mmol/L — ABNORMAL LOW (ref 22–32)
Calcium: 8.4 mg/dL — ABNORMAL LOW (ref 8.9–10.3)
Chloride: 99 mmol/L (ref 98–111)
Creatinine, Ser: 5.49 mg/dL — ABNORMAL HIGH (ref 0.61–1.24)
GFR, Estimated: 9 mL/min — ABNORMAL LOW (ref >60)
Glucose, Bld: 87 mg/dL (ref 70–99)
Potassium: 4.9 mmol/L (ref 3.5–5.1)
Sodium: 134 mmol/L — ABNORMAL LOW (ref 135–145)
Total Bilirubin: 0.4 mg/dL (ref 0.0–1.2)
Total Protein: 7.2 g/dL (ref 6.5–8.1)

## 2024-01-25 LAB — MRSA NEXT GEN BY PCR, NASAL
MRSA by PCR Next Gen: NOT DETECTED
MRSA by PCR Next Gen: NOT DETECTED

## 2024-01-25 LAB — TROPONIN I (HIGH SENSITIVITY): Troponin I (High Sensitivity): 9 ng/L (ref <18)

## 2024-01-25 LAB — PROTIME-INR
INR: 1 (ref 0.8–1.2)
Prothrombin Time: 14.2 s (ref 11.4–15.2)

## 2024-01-25 LAB — LACTIC ACID, PLASMA
Lactic Acid, Venous: 1.8 mmol/L (ref 0.5–1.9)
Lactic Acid, Venous: 1.1 mmol/L (ref 0.5–1.9)
Lactic Acid, Venous: 1 mmol/L (ref 0.5–1.9)

## 2024-01-25 LAB — STREP PNEUMONIAE URINARY ANTIGEN: Strep Pneumo Urinary Antigen: NEGATIVE

## 2024-01-25 LAB — SARS CORONAVIRUS 2 BY RT PCR: SARS Coronavirus 2 by RT PCR: NEGATIVE

## 2024-01-25 LAB — C-REACTIVE PROTEIN: CRP: 2.2 mg/dL — ABNORMAL HIGH (ref <1.0)

## 2024-01-25 LAB — BRAIN NATRIURETIC PEPTIDE: B Natriuretic Peptide: 77.6 pg/mL (ref 0.0–100.0)

## 2024-01-25 LAB — D-DIMER, QUANTITATIVE: D-Dimer, Quant: 0.79 ug{FEU}/mL — ABNORMAL HIGH (ref 0.00–0.50)

## 2024-01-25 MED ORDER — DULOXETINE HCL 30 MG PO CPEP
30.0000 mg | ORAL_CAPSULE | Freq: Every day | ORAL | Status: DC
  Administered 2024-01-25 – 2024-01-28 (×4): 30 mg via ORAL
  Filled 2024-01-25 (×4): qty 1

## 2024-01-25 MED ORDER — NOREPINEPHRINE 4 MG/250ML-% IV SOLN
0.0000 ug/min | INTRAVENOUS | Status: DC
  Administered 2024-01-25: 5 ug/min via INTRAVENOUS
  Administered 2024-01-25: 10 ug/min via INTRAVENOUS
  Filled 2024-01-25 (×2): qty 250

## 2024-01-25 MED ORDER — SODIUM CHLORIDE 0.9 % IV SOLN
1.0000 g | Freq: Once | INTRAVENOUS | Status: AC
  Administered 2024-01-25: 1 g via INTRAVENOUS
  Filled 2024-01-25: qty 10

## 2024-01-25 MED ORDER — SODIUM CHLORIDE 0.9 % IV SOLN
500.0000 mg | Freq: Once | INTRAVENOUS | Status: AC
  Administered 2024-01-25: 500 mg via INTRAVENOUS
  Filled 2024-01-25: qty 5

## 2024-01-25 MED ORDER — HEPARIN SODIUM (PORCINE) 5000 UNIT/ML IJ SOLN
5000.0000 [IU] | Freq: Three times a day (TID) | INTRAMUSCULAR | Status: DC
  Administered 2024-01-25 – 2024-01-28 (×8): 5000 [IU] via SUBCUTANEOUS
  Filled 2024-01-25 (×8): qty 1

## 2024-01-25 MED ORDER — IPRATROPIUM-ALBUTEROL 0.5-2.5 (3) MG/3ML IN SOLN: 3.0000 mL | Freq: Four times a day (QID) | RESPIRATORY_TRACT | Status: DC | PRN

## 2024-01-25 MED ORDER — GABAPENTIN 300 MG PO CAPS
300.0000 mg | ORAL_CAPSULE | Freq: Three times a day (TID) | ORAL | Status: DC
Start: 2024-01-25 — End: 2024-01-28
  Administered 2024-01-25 – 2024-01-28 (×8): 300 mg via ORAL
  Filled 2024-01-25 (×8): qty 1

## 2024-01-25 MED ORDER — LEVOTHYROXINE SODIUM 50 MCG PO TABS
50.0000 ug | ORAL_TABLET | Freq: Every day | ORAL | Status: DC
  Administered 2024-01-26 – 2024-01-28 (×3): 50 ug via ORAL
  Filled 2024-01-25 (×3): qty 1

## 2024-01-25 MED ORDER — GERHARDT'S BUTT CREAM
TOPICAL_CREAM | Freq: Three times a day (TID) | CUTANEOUS | Status: DC
  Administered 2024-01-26: 1 via TOPICAL
  Filled 2024-01-25 (×2): qty 60

## 2024-01-25 MED ORDER — HYDROCODONE-ACETAMINOPHEN 5-325 MG PO TABS
1.0000 | ORAL_TABLET | Freq: Two times a day (BID) | ORAL | Status: DC | PRN
  Administered 2024-01-26 – 2024-01-27 (×3): 1 via ORAL
  Filled 2024-01-25 (×4): qty 1

## 2024-01-25 MED ORDER — DOCUSATE SODIUM 100 MG PO CAPS
100.0000 mg | ORAL_CAPSULE | Freq: Two times a day (BID) | ORAL | Status: DC | PRN
  Administered 2024-01-26: 100 mg via ORAL
  Filled 2024-01-25: qty 1

## 2024-01-25 MED ORDER — SODIUM CHLORIDE 0.9 % IV BOLUS
1000.0000 mL | Freq: Once | INTRAVENOUS | Status: AC
  Administered 2024-01-25: 1000 mL via INTRAVENOUS

## 2024-01-25 MED ORDER — CHLORHEXIDINE GLUCONATE CLOTH 2 % EX PADS
6.0000 | MEDICATED_PAD | Freq: Every day | CUTANEOUS | Status: DC
  Administered 2024-01-25 – 2024-01-26 (×2): 6 via TOPICAL

## 2024-01-25 MED ORDER — LACTATED RINGERS IV BOLUS
1000.0000 mL | Freq: Once | INTRAVENOUS | Status: AC
  Administered 2024-01-25: 1000 mL via INTRAVENOUS

## 2024-01-25 MED ORDER — FLUTICASONE FUROATE-VILANTEROL 100-25 MCG/ACT IN AEPB
1.0000 | INHALATION_SPRAY | Freq: Every day | RESPIRATORY_TRACT | Status: DC
  Administered 2024-01-25 – 2024-01-28 (×4): 1 via RESPIRATORY_TRACT
  Filled 2024-01-25 (×2): qty 28

## 2024-01-25 MED ORDER — SIMVASTATIN 20 MG PO TABS
40.0000 mg | ORAL_TABLET | Freq: Every day | ORAL | Status: DC
  Administered 2024-01-25 – 2024-01-27 (×3): 40 mg via ORAL
  Filled 2024-01-25 (×3): qty 2

## 2024-01-25 MED ORDER — POLYETHYLENE GLYCOL 3350 17 G PO PACK
17.0000 g | PACK | Freq: Every day | ORAL | Status: DC | PRN
  Administered 2024-01-27: 17 g via ORAL
  Filled 2024-01-25: qty 1

## 2024-01-25 MED ORDER — MIDODRINE HCL 5 MG PO TABS
10.0000 mg | ORAL_TABLET | Freq: Three times a day (TID) | ORAL | Status: DC
  Administered 2024-01-25 – 2024-01-27 (×5): 10 mg via ORAL
  Filled 2024-01-25 (×5): qty 2

## 2024-01-25 NOTE — ED Triage Notes (Signed)
 Patient coming in for hypotension (BP 88/44) at home and urinary retention. Last urine output last night. Veteran also c/o nausea, ate 1 bite eggs/sausage this am; denies emesis.

## 2024-01-25 NOTE — Plan of Care (Signed)

## 2024-01-25 NOTE — ED Notes (Signed)
 Called daughter Olam per patient's request and updated her about patient's current condition.

## 2024-01-25 NOTE — H&P (Signed)
 CRITICAL CARE NOTE    Name: Mark Terry. MRN: [id number] DOB: 04-05-1935     LOS: 0   SUBJECTIVE FINDINGS & SIGNIFICANT EVENTS    History of Presenting Illness:  89 yo M with hx of DM, Dyslipidemia, anemia,Bilateral rotator cuff repair/shoulder replacement surgery,  HTN, Diastolic CHF, chronic bilateral lower extermity edema s/p  vascular evaluation for vascular insufficinecy, chronic bronchitis with productive cough, COPD, OSA on CPAP, CKD coming in from home complaining of malaise and undue fatigue x 2 d.  Reports loss of appetite found to have AKI and RLL infiltrate on workup.  Was found to be in shock and despite IV Fluid treatment continued to be profoundly hypotensive. He does have elevated lactate.  His oxygen was noted to be low and he required supplemental O2 due to desaturation.  He is hypothermic during my evaluation.  Vasopressor support was initiated due to shock physiology after 2L bolus of IVF.   Lines/tubes : Epidural Catheter 06/09/18 (Active)     Epidural Catheter 07/23/18 (Active)    Microbiology/Sepsis markers: Results for orders placed or performed in visit on 09/27/20  Urine Culture     Status: Abnormal   Collection Time: 09/27/20  8:34 AM   Specimen: Urine   Urine  Result Value Ref Range Status   Urine Culture, Routine Final report (A)  Final   Organism ID, Bacteria Klebsiella aerogenes (A)  Final    Comment: Greater than 100,000 colony forming units per mL formerly Enterobacter aerogenes    Antimicrobial Susceptibility Comment  Final    Comment:       ** S = Susceptible; I = Intermediate; R = Resistant **                    P = Positive; N = Negative             MICS are expressed in micrograms per mL    Antibiotic                 RSLT#1    RSLT#2    RSLT#3     RSLT#4 Amoxicillin /Clavulanic Acid    R Cefepime                       S Ceftriaxone                     S Cefuroxime                     R Ciprofloxacin                   S Ertapenem                      S Gentamicin                     S Imipenem                       S Levofloxacin                   S Meropenem                      S Nitrofurantoin                 I Tetracycline  S Tobramycin                     S Trimethoprim /Sulfa              S     Anti-infectives:  Anti-infectives (From admission, onward)    Start     Dose/Rate Route Frequency Ordered Stop   01/25/24 1345  cefTRIAXone  (ROCEPHIN ) 1 g in sodium chloride  0.9 % 100 mL IVPB        1 g 200 mL/hr over 30 Minutes Intravenous  Once 01/25/24 1341 01/25/24 1427   01/25/24 1345  azithromycin  (ZITHROMAX ) 500 mg in sodium chloride  0.9 % 250 mL IVPB        500 mg 250 mL/hr over 60 Minutes Intravenous  Once 01/25/24 1341           PAST MEDICAL HISTORY   Past Medical History:  Diagnosis Date   Anemia    Bruises easily    Diabetes mellitus without complication (HCC)    H/O esophagogastroduodenoscopy    H/O hiatal hernia    History of blood transfusion    no reaction noted   History of bronchitis    last time a couple of yrs ago   History of gastric ulcer    History of MRSA infection    Hyperlipidemia    Hypertension    Leg cramps    takes quinine  prn   Melanoma (HCC)    Panic attacks    Pneumonia    hx of last time a couple of yrs ago   PONV (postoperative nausea and vomiting)    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 laminectomy Dr. Louis 2008 cervical fusion x 2   bilateral cataract surgery     CHOLECYSTECTOMY  1970   COLONOSCOPY     CORONARY ANGIOPLASTY     CORONARY ARTERY BYPASS GRAFT  2000    3 vessels   ESOPHAGOGASTRODUODENOSCOPY (EGD) WITH PROPOFOL  N/A 11/18/2017   Procedure: ESOPHAGOGASTRODUODENOSCOPY (EGD) WITH PROPOFOL ;  Surgeon: Therisa Bi, MD;  Location: [facility] ENDOSCOPY;  Service: Gastroenterology;  Laterality: N/A;   GANGLION CYST EXCISION Right 10/22/2022   Dr. Kathlynn   JOINT REPLACEMENT     MOHS SURGERY Left 12/30/2018   [facility], Dorn Ahle, MD   NECK SURGERY     x 2   prostate laser surgery     x 2   REPLACEMENT TOTAL KNEE BILATERAL  '86 and 2000   REVERSE SHOULDER ARTHROPLASTY  05/30/2012   Procedure: REVERSE SHOULDER ARTHROPLASTY;  Surgeon: Elspeth JONELLE Her, MD;  Location: [facility] OR;  Service: Orthopedics;  Laterality: Right;  right reverse shoulder arthroplasty   SQUAMOUS CELL CARCINOMA EXCISION  05/01/2019   mid superior forehead/ [facility]/ Dr. Dorn L. Cook   TOTAL HIP ARTHROPLASTY Right 2012   [facility]     FAMILY HISTORY   Family History  Problem Relation Age of Onset   Alzheimer's disease Mother    Heart attack Father    Bipolar disorder Brother    Kidney disease  Neg Hx    Prostate cancer Neg Hx      SOCIAL HISTORY   Social History   Tobacco Use   Smoking status: Former    Current packs/day: 0.00    Average packs/day: 1 pack/day for 25.0 years (25.0 ttl pk-yrs)    Types: Cigarettes    Start date: 07/10/1943    Quit date: 07/09/1968    Years since quitting: 55.5    Passive exposure: Past   Smokeless tobacco: Never   Tobacco comments:    quit at age 89  Vaping Use   Vaping status: Never Used  Substance Use Topics   Alcohol use: No    Alcohol/week: 0.0 standard drinks of alcohol   Drug use: No     MEDICATIONS   Current Medication:  Current Facility-Administered  Medications:    azithromycin  (ZITHROMAX ) 500 mg in sodium chloride  0.9 % 250 mL IVPB, 500 mg, Intravenous, Once, Dorothyann Drivers, MD   norepinephrine  (LEVOPHED ) 4mg  in (0.016 mg/mL) premix infusion, 0-40 mcg/min, Intravenous, Continuous, Dorothyann Drivers, MD, Last Rate: 18.75 mL/hr at 01/25/24 1423, 5 mcg/min at 01/25/24 1423  Current Outpatient Medications:    Accu-Chek FastClix Lancets MISC, USE TO CHECK BLOOD GLUCOSE AS  DIRECTED, Disp: 102 each, Rfl: 3   ACCU-CHEK GUIDE test strip, CHECK BLOOD SUGAR ONCE DAILY FOR TYPE 2 DIABETES, Disp: 100 strip, Rfl: 3   alfuzosin  (UROXATRAL ) 10 MG 24 hr tablet, Take 1 tablet (10 mg total) by mouth daily with breakfast. Take in place of doxazosin , Disp: 90 tablet, Rfl: 3   aspirin  81 MG tablet, Take 81 mg by mouth daily., Disp: , Rfl:    Blood Glucose Monitoring Suppl (ACCU-CHEK GUIDE) w/Device KIT, USE AS DIRECTED DAILY, Disp: 1 kit, Rfl: 0   DULoxetine  (CYMBALTA ) 30 MG capsule, TAKE 1 CAPSULE BY MOUTH EVERY DAY, Disp: 90 capsule, Rfl: 3   empagliflozin  (JARDIANCE ) 25 MG TABS tablet, TAKE 1 TABLET BY MOUTH DAILY, Disp: 90 tablet, Rfl: 4   fluticasone  (FLONASE ) 50 MCG/ACT nasal spray, USE 1 TO 2 SPRAYS IN BOTH  NOSTRILS DAILY, Disp: 48 g, Rfl: 3   furosemide  (LASIX ) 40 MG tablet, TAKE 1 TABLET BY MOUTH DAILY, Disp: 90 tablet, Rfl: 3   gabapentin  (NEURONTIN ) 300 MG capsule, TAKE 1 CAPSULE BY MOUTH 3 TIMES  DAILY, Disp: 270 capsule, Rfl: 3   glimepiride  (AMARYL ) 1 MG tablet, TAKE 1 TABLET BY MOUTH DAILY, Disp: 90 tablet, Rfl: 4   HYDROcodone -acetaminophen  (NORCO/VICODIN) 5-325 MG tablet, TAKE 1 TABLET BY MOUTH TWICE DAILY AS NEEDED, Disp: , Rfl:    ibuprofen (ADVIL,MOTRIN) 200 MG tablet, Take 200 mg by mouth every 6 (six) hours as needed., Disp: , Rfl:    Lancets Misc. (ACCU-CHEK FASTCLIX LANCET) KIT, Used to check glucose.  DX E11.9, Disp: 1 kit, Rfl: 4   levothyroxine  (SYNTHROID ) 50 MCG tablet, TAKE 1 TABLET BY MOUTH DAILY, Disp: 90 tablet, Rfl:  3   loratadine  (CLARITIN ) 10 MG tablet, Take 10 mg by mouth daily., Disp: , Rfl:    losartan  (COZAAR ) 50 MG tablet, Take 1 tablet (50 mg total) by mouth daily., Disp: 90 tablet, Rfl: 1   meloxicam  (MOBIC ) 15 MG tablet, Take 15 mg by mouth daily., Disp: , Rfl:    metFORMIN  (GLUCOPHAGE -XR) 500 MG 24 hr tablet, TAKE 1 TABLET BY MOUTH TWICE  DAILY, Disp: 180 tablet, Rfl: 1   metoprolol  tartrate (LOPRESSOR ) 25 MG tablet, TAKE 1 TABLET BY MOUTH TWICE  DAILY, Disp: 180 tablet, Rfl: 4  Multiple Vitamin (MULTIVITAMIN WITH MINERALS) TABS, Take 1 tablet by mouth daily., Disp: , Rfl:    omeprazole  (PRILOSEC) 40 MG capsule, TAKE 1 CAPSULE BY MOUTH DAILY, Disp: 90 capsule, Rfl: 3   oxybutynin  (DITROPAN ) 5 MG tablet, TAKE 1 TABLET BY MOUTH TWICE  DAILY, Disp: 180 tablet, Rfl: 3   potassium chloride  SA (KLOR-CON  M) 20 MEQ tablet, TAKE 1 TABLET BY MOUTH DAILY, Disp: 90 tablet, Rfl: 3   simvastatin  (ZOCOR ) 40 MG tablet, TAKE 1 TABLET BY MOUTH AT  BEDTIME, Disp: 90 tablet, Rfl: 3   sulfamethoxazole -trimethoprim  (BACTRIM  DS) 800-160 MG tablet, Take 1 tablet by mouth 2 (two) times daily for 10 days., Disp: 20 tablet, Rfl: 0   traZODone  (DESYREL ) 100 MG tablet, TAKE 1/2 TO 1 TABLET BY MOUTH AT BEDTIME AS NEEDED. FOR SLEEP, Disp: 90 tablet, Rfl: 3   Vitamin D , Ergocalciferol , (DRISDOL ) 1.25 MG (50000 UNIT) CAPS capsule, Take 50,000 Units by mouth every 7 (seven) days., Disp: , Rfl:     ALLERGIES   Meperidine, Propoxyphene, Codeine sulfate, Darvon [propoxyphene hcl], and Oxycodone     REVIEW OF SYSTEMS    10 point ROS conducted and is negative except for fatigue, cough, anorexia  PHYSICAL EXAMINATION   Vital Signs: Temp:  [94.3 F (34.6 C)] 94.3 F (34.6 C) (07/19 1308) Pulse Rate:  [70-76] 70 (07/19 1330) Resp:  [11-17] 11 (07/19 1330) BP: (68-89)/(41-45) 68/42 (07/19 1330) SpO2:  [93 %-98 %] 93 % (07/19 1330) Weight:  [94.8 kg] 94.8 kg (07/19 1221)  GENERAL:mild distress due to acutely ill status,  Age appropriate HEAD: Normocephalic, atraumatic.  EYES: Pupils equal, round, reactive to light.  No scleral icterus.  MOUTH: Moist mucosal membrane. NECK: Supple. No thyromegaly. No nodules. No JVD.  PULMONARY: rhonchi on right side with decreased air entry CARDIOVASCULAR: S1 and S2. Regular rate and rhythm. No murmurs, rubs, or gallops.  GASTROINTESTINAL: Soft, nontender, non-distended. No masses. Positive bowel sounds. No hepatosplenomegaly.  MUSCULOSKELETAL: No swelling, clubbing, or edema.  NEUROLOGIC: Mild distress due to acute illness SKIN:intact,warm,dry   PERTINENT DATA     Infusions:  azithromycin  (ZITHROMAX ) 500 mg in sodium chloride  0.9 % 250 mL IVPB     norepinephrine  (LEVOPHED ) Adult infusion 5 mcg/min (01/25/24 1423)   Scheduled Medications:  PRN Medications:  Hemodynamic parameters:   Intake/Output: No intake/output data recorded.  Ventilator  Settings:     LAB RESULTS:  Basic Metabolic Panel: Recent Labs  Lab 01/25/24 1246  NA 134*  K 4.9  CL 99  CO2 21*  GLUCOSE 87  BUN 80*  CREATININE 5.49*  CALCIUM 8.4*   Liver Function Tests: Recent Labs  Lab 01/25/24 1246  AST 24  ALT 23  ALKPHOS 61  BILITOT 0.4  PROT 7.2  ALBUMIN 3.6   No results for input(s): LIPASE, AMYLASE in the last 168 hours. No results for input(s): AMMONIA in the last 168 hours. CBC: Recent Labs  Lab 01/25/24 1246  WBC 6.2  NEUTROABS 4.0  HGB 10.1*  HCT 31.7*  MCV 98.8  PLT 107*   Cardiac Enzymes: No results for input(s): CKTOTAL, CKMB, CKMBINDEX, TROPONINI in the last 168 hours. BNP: Invalid input(s): POCBNP CBG: No results for input(s): GLUCAP in the last 168 hours.     IMAGING RESULTS:     ASSESSMENT AND PLAN    -Multidisciplinary rounds held today  Acute Hypoxic Respiratory Failure -suspect due to Pneumonia with CXR showing infiltrate on Right lower lung zone -background of COPD with chronic bronchitic phenotype -MRSA  PCR -Procalcitoinin trend -CRP daily  -empiric CAP coverage Rocephin  Zithromax  -IS and flutter for chest physiotherapy -VBG -RVP -COVID/RSV/Influenza testing   2. Septic shock - present on admission     - due to either UTI or pneumonia     -history of urine culture with >100,000 colonies of   Bacteria Klebsiella aerogenes Abnormal   -perform blood cultures -urine cultures  -urinalysis -continue Rocephin  zithromax  for now -lactate trend -IVF - judicious use due to CHF  3. Renal Failure-Acute on chronic -KDIGO 4 -nephrology consult -US  renal  -dc nonessential nephrotoxic meds -follow chem 7 -follow UO -continue Foley Catheter-assess need daily   4. COPD with chronic bronchitic phenotype    - continue Duoneb q6h prn    - IS and flutter device    - Breo ellipta    5. Obstructive sleep apnea     - CPAP at bedtime - RT for settings   6. Neuropathy   Continue gabapentin  and cymbalta    7. ID -continue IV abx as prescibed -follow up cultures  8. GI/Nutrition GI PROPHYLAXIS as indicated- on prilosec at home for GERD  DIET-->TF's as tolerated Constipation protocol as indicated  9. ENDO - ICU hypoglycemic\Hyperglycemia protocol -check FSBS per protocol   10. ELECTROLYTES -follow labs as needed -replace as needed -pharmacy consultation   11. DVT/GI PRX ordered -SCDs  TRANSFUSIONS AS NEEDED MONITOR FSBS ASSESS the need for LABS as needed    Critical care provider statement:   Total critical care time: 93 minutes   Performed by: Parris MD   Critical care time was exclusive of separately billable procedures and treating other patients.   Critical care was necessary to treat or prevent imminent or life-threatening deterioration.   Critical care was time spent personally by me on the following activities: development of treatment plan with patient and/or surrogate as well as nursing, discussions with consultants, evaluation of patient's response to  treatment, examination of patient, obtaining history from patient or surrogate, ordering and performing treatments and interventions, ordering and review of laboratory studies, ordering and review of radiographic studies, pulse oximetry and re-evaluation of patient's condition.    Antonin Meininger, M.D.  Pulmonary & Critical Care Medicine

## 2024-01-25 NOTE — ED Provider Notes (Signed)
 [facility] Provider Note    Event Date/Time   First MD Initiated Contact with Patient 01/25/24 1226     (approximate)  History   Chief Complaint: Hypotension  HPI  Mark Terry. is a 89 y.o. male with a past medical history of anemia, diabetes, hypertension, hyperlipidemia, presents to the emergency department for generalized fatigue and not feeling well.  According to the patient over the last 2 to 3 days he states he has not been feeling very well with generalized fatigue has had a slight cough.  Patient denies any fever at any point.  States some nausea but denies any vomiting.  Patient states he has not been eating or drinking much of anything over the last 2 days due to lack of appetite and states he has not urinated since last night.  Patient denies any chest pain.  Physical Exam   Triage Vital Signs: ED Triage Vitals  Encounter Vitals Group     BP 01/25/24 1220 (!) 89/41     Girls Systolic BP Percentile --      Girls Diastolic BP Percentile --      Boys Systolic BP Percentile --      Boys Diastolic BP Percentile --      Pulse Rate 01/25/24 1220 72     Resp 01/25/24 1220 16     Temp 01/25/24 1308 (!) 94.3 F (34.6 C)     Temp Source 01/25/24 1308 Rectal     SpO2 01/25/24 1220 98 %     Weight 01/25/24 1221 208 lb 15.9 oz (94.8 kg)     Height 01/25/24 1221 5' 3 (1.6 m)     Head Circumference --      Peak Flow --      Pain Score 01/25/24 1221 0     Pain Loc --      Pain Education --      Exclude from Growth Chart --     Most recent vital signs: Vitals:   01/25/24 1308 01/25/24 1330  BP:  (!) 68/42  Pulse:  70  Resp:  11  Temp: (!) 94.3 F (34.6 C)   SpO2:  93%    General: Awake, no distress.  CV:  Good peripheral perfusion.  Regular rate and rhythm  Resp:  Normal effort.  Equal breath sounds bilaterally.  Abd:  No distention.  Soft, nontender.  No rebound or guarding.  ED Results / Procedures / Treatments   EKG  EKG  viewed and interpreted by myself shows a sinus rhythm at 71 bpm with a narrow QRS, normal axis, normal intervals, no concerning ST changes.  RADIOLOGY  I have reviewed interpreted chest x-ray images.  No obvious consolidation on my evaluation. Radiologist read the x-ray is consistent with a right lower lobe pneumonia.   MEDICATIONS ORDERED IN ED: Medications  sodium chloride  0.9 % bolus 1,000 mL (has no administration in time range)  cefTRIAXone  (ROCEPHIN ) 1 g in sodium chloride  0.9 % 100 mL IVPB (has no administration in time range)  azithromycin  (ZITHROMAX ) 500 mg in sodium chloride  0.9 % 250 mL IVPB (has no administration in time range)  sodium chloride  0.9 % bolus 1,000 mL (1,000 mLs Intravenous New Bag/Given 01/25/24 1251)     IMPRESSION / MDM / ASSESSMENT AND PLAN / ED COURSE  I reviewed the triage vital signs and the nursing notes.  Patient's presentation is most consistent with acute presentation with potential threat to life or bodily function.  Patient presents to the emergency department for generalized weakness fatigue decreased appetite not eating or drinking over the last 2 days and no urine output since last night.  Patient states he has had a slight cough as well but states that is fairly chronic for him but occasionally getting sputum up with his cough.  Denies any fever.  Patient's workup in the emergency department shows a reassuring CBC with a normal white blood cell count, patient's chemistry however shows significant renal dysfunction with a creatinine of 5.4 from a baseline closer to 1.8 consistent with acute renal failure.  Lactic acid borderline elevated at 1.8.  Patient's chest x-ray is concerning for right lower lobe pneumonia.  Patient's blood pressure remains low.  Finishing his first liter of IV fluids we will order a second liter of IV fluids.  I suspect the acute kidney injury/renal failure is leading to the hypotension.  However given the right lower lobe  pneumonia on chest x-ray with cough and generalized weakness fatigue we will cover with antibiotics for pneumonia.  I have added a procalcitonin onto the patient's workup, urinalysis is pending.  Once remainder of the workup is been completed patient will require admission to the hospital service for further workup and treatment.  Patient did desat into the upper 80s.  Again with his chest x-ray concerning for pneumonia we will place patient on a couple liters of oxygen continue to treat with antibiotics. Troponin is negative.  Patient has received 2 L of IV fluids blood pressure continues to be low currently 68/42.  Given 2 L of fluid with hypotension and hypothermia we will start the patient on Levophed  for blood pressure support.  Will discuss with ICU for admission.  CRITICAL CARE Performed by: Franky Moores   Total critical care time: 30 minutes  Critical care time was exclusive of separately billable procedures and treating other patients.  Critical care was necessary to treat or prevent imminent or life-threatening deterioration.  Critical care was time spent personally by me on the following activities: development of treatment plan with patient and/or surrogate as well as nursing, discussions with consultants, evaluation of patient's response to treatment, examination of patient, obtaining history from patient or surrogate, ordering and performing treatments and interventions, ordering and review of laboratory studies, ordering and review of radiographic studies, pulse oximetry and re-evaluation of patient's condition.   FINAL CLINICAL IMPRESSION(S) / ED DIAGNOSES   Hypotension Acute renal failure Pneumonia  Note:  This document was prepared using Dragon voice recognition software and may include unintentional dictation errors.   Moores Franky, MD 01/25/24 [phone]

## 2024-01-25 NOTE — Progress Notes (Signed)
 [facility] CONSULT NOTE    Date: 01/25/2024                  Patient Name:  Mark Terry.  MRN: [id number]  DOB: 1934/09/16  Age / Sex: 89 y.o., male         PCP: Gasper Nancyann BRAVO, MD                 Service Requesting Consult:                  Reason for Consult: Acute kidney injury            History of Present Illness: Patient is a 89 y.o. male with a PMHx of hypertension, coronary artery disease, congestive heart failure, COPD, obstructive sleep apnea on CPAP, diabetes, peripheral vascular disease, chronic kidney disease with anemia admitted with malaise and undue fatigue.  He has not been eating well.  He had chest x-ray showing pneumonia.  He was started on Rocephin  and Zithromax .  He also has acute kidney injury on chronic kidney disease.  Bladder scan is negative so far.  He was given IV fluids with normal saline x 2 L.  He has been taking ibuprofen.  Patient was given Bactrim  as outpatient for a urinary tract infection.  He has been on Jardiance  and losartan .   Medications: Outpatient medications: (Not in a hospital admission)   Discontinued Meds:  There are no discontinued medications.  Current medications: Current Facility-Administered Medications  Medication Dose Route Frequency Provider Last Rate Last Admin   docusate sodium  (COLACE) capsule 100 mg  100 mg Oral BID PRN Aleskerov, Fuad, MD       fluticasone  furoate-vilanterol (BREO ELLIPTA ) 100-25 MCG/ACT 1 puff  1 puff Inhalation Daily Aleskerov, Fuad, MD       heparin  injection 5,000 Units  5,000 Units Subcutaneous Q8H Aleskerov, Fuad, MD       ipratropium-albuterol  (DUONEB) 0.5-2.5 (3) MG/3ML nebulizer solution 3 mL  3 mL Nebulization Q6H PRN Parris Manna, MD       midodrine  (PROAMATINE ) tablet 10 mg  10 mg Oral TID WC Aleskerov, Fuad, MD       norepinephrine  (LEVOPHED ) 4mg  in (0.016 mg/mL) premix infusion  0-40 mcg/min Intravenous Continuous Dorothyann Drivers, MD 37.5 mL/hr  at 01/25/24 1442 10 mcg/min at 01/25/24 1442   polyethylene glycol (MIRALAX  / GLYCOLAX ) packet 17 g  17 g Oral Daily PRN Aleskerov, Fuad, MD       Current Outpatient Medications  Medication Sig Dispense Refill   Accu-Chek FastClix Lancets MISC USE TO CHECK BLOOD GLUCOSE AS  DIRECTED 102 each 3   ACCU-CHEK GUIDE test strip CHECK BLOOD SUGAR ONCE DAILY FOR TYPE 2 DIABETES 100 strip 3   alfuzosin  (UROXATRAL ) 10 MG 24 hr tablet Take 1 tablet (10 mg total) by mouth daily with breakfast. Take in place of doxazosin  90 tablet 3   aspirin  81 MG tablet Take 81 mg by mouth daily.     Blood Glucose Monitoring Suppl (ACCU-CHEK GUIDE) w/Device KIT USE AS DIRECTED DAILY 1 kit 0   DULoxetine  (CYMBALTA ) 30 MG capsule TAKE 1 CAPSULE BY MOUTH EVERY DAY 90 capsule 3   empagliflozin  (JARDIANCE ) 25 MG TABS tablet TAKE 1 TABLET BY MOUTH DAILY 90 tablet 4   fluticasone  (FLONASE ) 50 MCG/ACT nasal spray USE 1 TO 2 SPRAYS IN BOTH  NOSTRILS DAILY 48 g 3   furosemide  (LASIX ) 40 MG tablet TAKE 1 TABLET BY MOUTH DAILY  90 tablet 3   gabapentin  (NEURONTIN ) 300 MG capsule TAKE 1 CAPSULE BY MOUTH 3 TIMES  DAILY 270 capsule 3   glimepiride  (AMARYL ) 1 MG tablet TAKE 1 TABLET BY MOUTH DAILY 90 tablet 4   HYDROcodone -acetaminophen  (NORCO/VICODIN) 5-325 MG tablet TAKE 1 TABLET BY MOUTH TWICE DAILY AS NEEDED     ibuprofen (ADVIL,MOTRIN) 200 MG tablet Take 200 mg by mouth every 6 (six) hours as needed.     Lancets Misc. (ACCU-CHEK FASTCLIX LANCET) KIT Used to check glucose.  DX E11.9 1 kit 4   levothyroxine  (SYNTHROID ) 50 MCG tablet TAKE 1 TABLET BY MOUTH DAILY 90 tablet 3   loratadine  (CLARITIN ) 10 MG tablet Take 10 mg by mouth daily.     losartan  (COZAAR ) 50 MG tablet Take 1 tablet (50 mg total) by mouth daily. 90 tablet 1   meloxicam  (MOBIC ) 15 MG tablet Take 15 mg by mouth daily.     metFORMIN  (GLUCOPHAGE -XR) 500 MG 24 hr tablet TAKE 1 TABLET BY MOUTH TWICE  DAILY 180 tablet 1   metoprolol  tartrate (LOPRESSOR ) 25 MG tablet TAKE 1  TABLET BY MOUTH TWICE  DAILY 180 tablet 4   Multiple Vitamin (MULTIVITAMIN WITH MINERALS) TABS Take 1 tablet by mouth daily.     omeprazole  (PRILOSEC) 40 MG capsule TAKE 1 CAPSULE BY MOUTH DAILY 90 capsule 3   oxybutynin  (DITROPAN ) 5 MG tablet TAKE 1 TABLET BY MOUTH TWICE  DAILY 180 tablet 3   potassium chloride  SA (KLOR-CON  M) 20 MEQ tablet TAKE 1 TABLET BY MOUTH DAILY 90 tablet 3   simvastatin  (ZOCOR ) 40 MG tablet TAKE 1 TABLET BY MOUTH AT  BEDTIME 90 tablet 3   sulfamethoxazole -trimethoprim  (BACTRIM  DS) 800-160 MG tablet Take 1 tablet by mouth 2 (two) times daily for 10 days. 20 tablet 0   traZODone  (DESYREL ) 100 MG tablet TAKE 1/2 TO 1 TABLET BY MOUTH AT BEDTIME AS NEEDED. FOR SLEEP 90 tablet 3   Vitamin D , Ergocalciferol , (DRISDOL ) 1.25 MG (50000 UNIT) CAPS capsule Take 50,000 Units by mouth every 7 (seven) days.        Allergies: Allergies  Allergen Reactions   Meperidine Other (See Comments) and Shortness Of Breath    Short of breath   Propoxyphene Anaphylaxis, Other (See Comments) and Shortness Of Breath    Short of breath   Codeine Sulfate     Patient denies   Darvon [Propoxyphene Hcl] Other (See Comments)    Short of breath   Oxycodone  Nausea Only      Past Medical History: Past Medical History:  Diagnosis Date   Anemia    Bruises easily    Diabetes mellitus without complication (HCC)    H/O esophagogastroduodenoscopy    H/O hiatal hernia    History of blood transfusion    no reaction noted   History of bronchitis    last time a couple of yrs ago   History of gastric ulcer    History of MRSA infection    Hyperlipidemia    Hypertension    Leg cramps    takes quinine  prn   Melanoma (HCC)    Panic attacks    Pneumonia    hx of last time a couple of yrs ago   PONV (postoperative nausea and vomiting)    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 laminectomy Dr. Louis 2008 cervical fusion x 2   bilateral cataract surgery     CHOLECYSTECTOMY  1970   COLONOSCOPY     CORONARY ANGIOPLASTY     CORONARY ARTERY BYPASS GRAFT  2000    3 vessels   ESOPHAGOGASTRODUODENOSCOPY (EGD) WITH PROPOFOL  N/A 11/18/2017   Procedure: ESOPHAGOGASTRODUODENOSCOPY (EGD) WITH PROPOFOL ;  Surgeon: Therisa Bi, MD;  Location: [facility] ENDOSCOPY;  Service: Gastroenterology;  Laterality: N/A;   GANGLION CYST EXCISION Right 10/22/2022   Dr. Kathlynn   JOINT REPLACEMENT     MOHS SURGERY Left 12/30/2018   [facility], Dorn Ahle, MD   NECK SURGERY     x 2   prostate laser surgery     x 2   REPLACEMENT TOTAL KNEE BILATERAL  '86 and 2000   REVERSE SHOULDER ARTHROPLASTY  05/30/2012   Procedure: REVERSE SHOULDER ARTHROPLASTY;  Surgeon: Elspeth JONELLE Her, MD;  Location: [facility] OR;  Service: Orthopedics;  Laterality: Right;  right reverse shoulder arthroplasty   SQUAMOUS CELL CARCINOMA EXCISION  05/01/2019   mid superior forehead/ [facility]/ Dr. Dorn L. Cook   TOTAL HIP ARTHROPLASTY Right 2012   [facility]     Family History: Family History  Problem Relation Age of Onset   Alzheimer's disease Mother    Heart attack Father    Bipolar disorder Brother    Kidney disease Neg Hx    Prostate cancer Neg Hx      Social History: Social History   Socioeconomic History   Marital status: Divorced    Spouse name: Not on file   Number of children: 2   Years of education: 12   Highest education level: 12th grade  Occupational History   Occupation: Retired  Tobacco Use   Smoking status: Former    Current packs/day: 0.00    Average packs/day: 1  pack/day for 25.0 years (25.0 ttl pk-yrs)    Types: Cigarettes    Start date: 07/10/1943    Quit date: 07/09/1968    Years since quitting: 55.5    Passive exposure: Past   Smokeless tobacco: Never   Tobacco comments:    quit at age 60  Vaping Use   Vaping status: Never Used  Substance and Sexual Activity   Alcohol use: No    Alcohol/week: 0.0 standard drinks of alcohol   Drug use: No   Sexual activity: Never  Other Topics Concern   Not on file  Social History Narrative   Not on file   Social Drivers of Health   Financial Resource Strain: Low Risk  (06/25/2023)   Overall Financial Resource Strain (CARDIA)    Difficulty of Paying Living Expenses: Not very hard  Food Insecurity: No Food Insecurity (06/25/2023)   Hunger Vital Sign    Worried About Running Out of Food in the Last Year: Never true    Ran Out of Food in the Last Year: Never true  Transportation Needs: No Transportation Needs (06/25/2023)   PRAPARE - Administrator, Civil Service (Medical): No    Lack of Transportation (Non-Medical): No  Physical Activity: Sufficiently Active (06/25/2023)   Exercise Vital Sign    Days of Exercise per Week: 3 days    Minutes of Exercise  per Session: 60 min  Stress: No Stress Concern Present (06/25/2023)   [employer] of Occupational Health - Occupational Stress Questionnaire    Feeling of Stress : Only a little  Social Connections: Socially Isolated (06/25/2023)   Social Connection and Isolation Panel    Frequency of Communication with Friends and Family: More than three times a week    Frequency of Social Gatherings with Friends and Family: Never    Attends Religious Services: Never    Database administrator or Organizations: No    Attends Banker Meetings: Never    Marital Status: Divorced  Catering manager Violence: Not At Risk (06/25/2023)   Humiliation, Afraid, Rape, and Kick questionnaire    Fear of Current or Ex-Partner: No    Emotionally  Abused: No    Physically Abused: No    Sexually Abused: No     Review of Systems: As per HPI  Vital Signs: Blood pressure 96/70, pulse 73, temperature (!) 97.5 F (36.4 C), temperature source Axillary, resp. rate 15, height 5' 3 (1.6 m), weight 94.8 kg, SpO2 96%.  Weight trends: Filed Weights   01/25/24 1221  Weight: 94.8 kg    Physical Exam: Physical Exam: General:  No acute distress  Head:  Normocephalic, atraumatic. Moist oral mucosal membranes  Eyes:  Anicteric  Neck:  Supple  Lungs:   Clear to auscultation, normal effort  Heart:  S1S2 no rubs  Abdomen:   Soft, nontender, bowel sounds present  Extremities:  peripheral edema.  Neurologic:  Awake, alert, following commands  Skin:  No lesions  Access:     Lab results:  Basic Metabolic Panel: Recent Labs  Lab 01/25/24 1246  NA 134*  K 4.9  CL 99  CO2 21*  GLUCOSE 87  BUN 80*  CREATININE 5.49*  CALCIUM 8.4*    Creat  Date/Time Value Ref Range Status  05/13/2017 08:45 AM 1.70 (H) 0.70 - 1.11 mg/dL Final    Comment:    For patients >6 years of age, the reference limit for Creatinine is approximately 13% higher for people identified as African-American. .    Creatinine, Ser  Date/Time Value Ref Range Status  01/25/2024 12:46 PM 5.49 (H) 0.61 - 1.24 mg/dL Final  96/80/7974 91:67 AM 2.26 (H) 0.76 - 1.27 mg/dL Final  87/79/7975 88:65 AM 1.86 (H) 0.76 - 1.27 mg/dL Final  91/73/7975 90:98 AM 1.85 (H) 0.76 - 1.27 mg/dL Final  98/75/7975 98:47 PM 1.68 (H) 0.76 - 1.27 mg/dL Final  91/74/7976 91:45 AM 1.85 (H) 0.76 - 1.27 mg/dL Final  95/80/7976 91:47 AM 1.75 (H) 0.76 - 1.27 mg/dL Final  87/92/7977 89:56 AM 1.83 (H) 0.76 - 1.27 mg/dL Final  91/96/7977 88:43 AM 1.86 (H) 0.76 - 1.27 mg/dL Final  96/77/7977 91:52 AM 1.69 (H) 0.76 - 1.27 mg/dL Final  88/77/7978 90:75 AM 1.83 (H) 0.76 - 1.27 mg/dL Final  92/80/7978 90:49 AM 1.75 (H) 0.76 - 1.27 mg/dL Final  96/83/7978 91:48 AM 1.85 (H) 0.76 - 1.27 mg/dL Final   92/86/7979 90:97 AM 1.81 (H) 0.76 - 1.27 mg/dL Final  88/91/7980 91:54 AM 1.70 (H) 0.76 - 1.27 mg/dL Final  97/73/7981 90:80 AM 1.76 (H) 0.76 - 1.27 mg/dL Final  87/94/7982 88:81 AM 1.46 (H) 0.76 - 1.27 mg/dL Final  88/77/7982 89:82 AM 1.69 (H) 0.76 - 1.27 mg/dL Final  94/98/7982 96:58 AM 1.08 0.61 - 1.24 mg/dL Final  95/71/7982 98:95 PM 1.38 (H) 0.61 - 1.24 mg/dL Final  95/96/7982 90:83 AM 1.30 (H)  0.76 - 1.27 mg/dL Final  88/76/7986 94:99 AM 1.22 0.50 - 1.35 mg/dL Final  88/80/7986 90:93 AM 1.14 0.50 - 1.35 mg/dL Final    CBC: Recent Labs  Lab 01/25/24 1246  WBC 6.2  NEUTROABS 4.0  HGB 10.1*  HCT 31.7*  MCV 98.8  PLT 107*    Microbiology: Results for orders placed or performed in visit on 09/27/20  Urine Culture     Status: Abnormal   Collection Time: 09/27/20  8:34 AM   Specimen: Urine   Urine  Result Value Ref Range Status   Urine Culture, Routine Final report (A)  Final   Organism ID, Bacteria Klebsiella aerogenes (A)  Final    Comment: Greater than 100,000 colony forming units per mL formerly Enterobacter aerogenes    Antimicrobial Susceptibility Comment  Final    Comment:       ** S = Susceptible; I = Intermediate; R = Resistant **                    P = Positive; N = Negative             MICS are expressed in micrograms per mL    Antibiotic                 RSLT#1    RSLT#2    RSLT#3    RSLT#4 Amoxicillin /Clavulanic Acid    R Cefepime                       S Ceftriaxone                     S Cefuroxime                     R Ciprofloxacin                   S Ertapenem                      S Gentamicin                     S Imipenem                       S Levofloxacin                   S Meropenem                      S Nitrofurantoin                 I Tetracycline                   S Tobramycin                     S Trimethoprim /Sulfa              S     Urinalysis: No results for input(s): COLORURINE, LABSPEC, PHURINE, GLUCOSEU, HGBUR,  BILIRUBINUR, KETONESUR, PROTEINUR, UROBILINOGEN, NITRITE, LEUKOCYTESUR in the last 72 hours.  Invalid input(s): APPERANCEUR   Imaging:  DG Chest 2 View Result Date: 01/25/2024 CLINICAL DATA:  Hypotension.  Suspected sepsis.  Urinary retention. EXAM: CHEST - 2 VIEW COMPARISON:  06/15/2016. FINDINGS: Stable changes from previous CABG surgery. Cardiac silhouette is normal in size and configuration. No mediastinal or hilar masses or convincing adenopathy. There is  consolidation at the right lung base, posteromedial aspect of the right lower lobe. Few linear opacities are noted at the left lung base and left mid lung consistent with atelectasis/scarring. Remainder of the lungs is clear. No convincing pleural effusion and no pneumothorax. IMPRESSION: Right lower lobe pneumonia. Electronically Signed   By: Alm Parkins M.D.   On: 01/25/2024 13:14     Assessment & Plan:  89 y.o. male with a PMHx of hypertension, coronary artery disease, congestive heart failure, COPD, obstructive sleep apnea on CPAP, diabetes, peripheral vascular disease, chronic kidney disease with anemia admitted with malaise and undue fatigue.  He has not been eating well.  He had chest x-ray showing pneumonia.  He was started on Rocephin  and Zithromax .  He also has acute kidney injury on chronic kidney disease.  Bladder scan is negative so far.  He was given IV fluids with normal saline x 2 L.  There is no history of nonsteroidal anti-inflammatory drug use.  #1: Acute kidney injury on chronic kidney disease: Patient is at baseline creatinine 2.4 with EGFR of 27 cc/min.  Today's creatinine is 5.49 with a GFR of less than 20 cc/min.  Will place a Foley catheter.  Will also obtain a renal sonogram.  The acute kidney injury might be secondary to Bactrim /UTI/obstructive uropathy.  #2: Sepsis/UTI/pneumonia: Continue Zithromax  and Rocephin .  #3: Hypotension: Continue Levophed .  Will try to wean as tolerated.  Continue IV  fluids with isotonic saline at 75 cc an hour.  Will follow closely. Continue supportive care  LOS: 0 Pinkey Edman, MD [facility]. 7/19/20253:47 PM

## 2024-01-25 NOTE — ED Notes (Signed)
 Patient AOX4 at this time. Patient T: 94.3 rectal, bear hugger applied. Patient's BP low, fluids infusing at this time. Patient repositioned in bed with 2nd RN. Pillow put underneath left hip. When turning patient to obtain rectal temp. RN discovered reddened area's to patient's sacral region and underneath abdominal fold. MD notified.

## 2024-01-25 NOTE — ED Notes (Signed)
 RN attempted to take temperature. Oral temperature read 95. Warm blankets applied. Fluids started. Patient transferred to X-ray.

## 2024-01-25 NOTE — ED Notes (Addendum)
 Called Jody. Patient's daughter to provide an update. Per patients request and permission.

## 2024-01-25 NOTE — Discharge Instructions (Addendum)
 [facility]up [facility] as before

## 2024-01-25 NOTE — Consult Note (Signed)
 WOC Nurse Consult Note: Reason for Consult: sacral wound, R heel and MASD  Wound type: 1.  Full thickness sacrum uncertain etiology history of surgery for pilonidal cyst; wound bed appears red moist with small amount of tan fibrinous tissue  2.  Deep tissue pressure injury R heel that is evolving to unstageable purple maroon with developing dark eschar  3.  Intertriginous dermatitis to bilateral groin/thighs  widespread erythema with partial thickness skin loss and ? Satellite lesions  ICD-10 CM Codes for Irritant Dermatitis  L30.4  - Erythema intertrigo. Also used for abrasion of the hand, chafing of the skin, dermatitis due to sweating and friction, friction dermatitis, friction eczema, and genital/thigh intertrigo.  Pressure Injury POA: Yes Measurement: see nursing flowsheet  Wound bed: as above  Drainage (amount, consistency, odor) see nursing flowsheet  Periwound: MASD noted to buttocks  Dressing procedure/placement/frequency:  Cleanse groin/inner thighs/buttocks with Vashe wound cleanser, do not rinse and allow to air dry. Apply Gerhardt's Butt Cream 3 times a day and prn soiling.  May sprinkle over Gerhardt's with floor stock antifungal powder (Microguard white and green label) for extra drying effect.  Cleanse sacral wound with Vashe wound cleanser Soila [phone]) do not rinse and allow to air dry. Using a Q tip applicator apply Vashe moistened gauze to wound bed daily, cover with dry gauze and silicone foam or ABD pad whichever is preferred.  Cleanse R heel wound with Vashe, apply Xeroform gauze ([place] [phone]) to wound bed daily, secure with silicone foam or dry gauze and Kerlix roll gauze whichever is preferred. Place R foot in Prevalon boot Soila [phone]) to offload pressure.  POC discussed with bedside nurse. WOC team will not follow. Re-consult if further needs arise.    Thank you,    Powell Bar MSN, RN-BC, [employer]

## 2024-01-25 NOTE — ED Notes (Addendum)
 MD notified of patients low BP, low temperature, and bladder scan. Patietn also placed on 2L [place] due to oxygenation dropping into low 80s. Patients O2 in upper 90s currently.

## 2024-01-26 LAB — RENAL FUNCTION PANEL
Albumin: 2.8 g/dL — ABNORMAL LOW (ref 3.5–5.0)
Anion gap: 11 (ref 5–15)
BUN: 65 mg/dL — ABNORMAL HIGH (ref 8–23)
CO2: 18 mmol/L — ABNORMAL LOW (ref 22–32)
Calcium: 8.2 mg/dL — ABNORMAL LOW (ref 8.9–10.3)
Chloride: 102 mmol/L (ref 98–111)
Creatinine, Ser: 4.36 mg/dL — ABNORMAL HIGH (ref 0.61–1.24)
GFR, Estimated: 12 mL/min — ABNORMAL LOW (ref >60)
Glucose, Bld: 110 mg/dL — ABNORMAL HIGH (ref 70–99)
Phosphorus: 5 mg/dL — ABNORMAL HIGH (ref 2.5–4.6)
Potassium: 4.9 mmol/L (ref 3.5–5.1)
Sodium: 131 mmol/L — ABNORMAL LOW (ref 135–145)

## 2024-01-26 LAB — C-REACTIVE PROTEIN: CRP: 2.2 mg/dL — ABNORMAL HIGH (ref <1.0)

## 2024-01-26 LAB — MAGNESIUM: Magnesium: 2.1 mg/dL (ref 1.7–2.4)

## 2024-01-26 MED ORDER — CHLORHEXIDINE GLUCONATE CLOTH 2 % EX PADS
6.0000 | MEDICATED_PAD | Freq: Every evening | CUTANEOUS | Status: DC
  Administered 2024-01-27 (×2): 6 via TOPICAL

## 2024-01-26 MED ORDER — ORAL CARE MOUTH RINSE: 15.0000 mL | OROMUCOSAL | Status: DC | PRN

## 2024-01-26 MED ORDER — SODIUM CHLORIDE 0.9 % IV SOLN
500.0000 mg | INTRAVENOUS | Status: AC
  Administered 2024-01-26 – 2024-01-27 (×2): 500 mg via INTRAVENOUS
  Filled 2024-01-26 (×2): qty 5

## 2024-01-26 MED ORDER — SODIUM CHLORIDE 0.9 % IV SOLN
2.0000 g | INTRAVENOUS | Status: DC
  Administered 2024-01-26 – 2024-01-27 (×2): 2 g via INTRAVENOUS
  Filled 2024-01-26 (×2): qty 20

## 2024-01-26 NOTE — Consult Note (Signed)
 PHARMACY CONSULT NOTE - FOLLOW UP  Pharmacy Consult for Electrolyte Monitoring and Replacement   Recent Labs: Potassium (mmol/L)  Date Value  01/26/2024 4.9   Magnesium (mg/dL)  Date Value  92/79/7974 2.1   Calcium (mg/dL)  Date Value  92/79/7974 8.2 (L)   Albumin (g/dL)  Date Value  92/79/7974 2.8 (L)  09/25/2023 4.1   Phosphorus (mg/dL)  Date Value  92/79/7974 5.0 (H)   Sodium (mmol/L)  Date Value  01/26/2024 131 (L)  09/25/2023 142     Assessment: 89 y.o. male with a PMHx of hypertension, coronary artery disease, congestive heart failure, COPD, obstructive sleep apnea on CPAP, diabetes, peripheral vascular disease, chronic kidney disease with anemia admitted with malaise and undue fatigue. Pt presented with an AKI. Nephro following.   Goal of Therapy:  WNL  Plan:  No replacement needed.  F/u with AM labs.   Cathaleen GORMAN Blanch ,PharmD Clinical Pharmacist 01/26/2024 8:23 AM

## 2024-01-26 NOTE — Progress Notes (Signed)
 PT Cancellation Note  Patient Details Name: Mark Terry. MRN: [id number] DOB: January 05, 1935   Cancelled Treatment:    Reason Eval/Treat Not Completed: Patient declined, no reason specified. Pt declining participation in PT eval or any mobility at this time. Pt very lethargic and reporting that he has a lot of pain with mobility in his back and my bladder leaks anytime I sit up so I'll wet the bed and I don't want to do that; therefore, not wanting to participate at this time. PT will continue to f/u with pt acutely as available and appropriate.    [name] HERO Mallery Harshman 01/26/2024, 1:46 PM

## 2024-01-26 NOTE — Progress Notes (Signed)
 CRITICAL CARE NOTE    Name: Mark Terry. MRN: [id number] DOB: [birth year]     LOS: 1   SUBJECTIVE FINDINGS & SIGNIFICANT EVENTS    History of Presenting Illness:  89 yo M with hx of DM, Dyslipidemia, anemia,Bilateral rotator cuff repair/shoulder replacement surgery,  HTN, Diastolic CHF, chronic bilateral lower extermity edema s/p  vascular evaluation for vascular insufficinecy, chronic bronchitis with productive cough, COPD, OSA on CPAP, CKD coming in from home complaining of malaise and undue fatigue x 2 d.  Reports loss of appetite found to have AKI and RLL infiltrate on workup.  Was found to be in shock and despite IV Fluid treatment continued to be profoundly hypotensive. He does have elevated lactate.  His oxygen was noted to be low and he required supplemental O2 due to desaturation.  He is hypothermic during my evaluation.  Vasopressor support was initiated due to shock physiology after 2L bolus of IVF.   01/26/24- patient clinically improved and is up awake interactive stable off off levophed .  He had 2 daughters at bedside, we reviewed medical plan and answered questions.   Lines/tubes : Epidural Catheter 06/09/18 (Active)     Epidural Catheter 07/23/18 (Active)    Microbiology/Sepsis markers: Results for orders placed or performed during the hospital encounter of 01/25/24  Culture, blood (Routine x 2)     Status: None (Preliminary result)   Collection Time: 01/25/24 12:46 PM   Specimen: BLOOD  Result Value Ref Range Status   Specimen Description BLOOD LEFT ANTECUBITAL  Final   Special Requests   Final    BOTTLES DRAWN AEROBIC AND ANAEROBIC Blood Culture adequate volume   Culture   Final    NO GROWTH < 24 HOURS Performed at [facility], [address] [place], [place] [address]     Report Status PENDING  Incomplete  Culture, blood (Routine x 2)     Status: None (Preliminary result)   Collection Time: 01/25/24  1:17 PM   Specimen: BLOOD  Result Value Ref Range Status   Specimen Description BLOOD BLOOD RIGHT FOREARM  Final   Special Requests   Final    BOTTLES DRAWN AEROBIC AND ANAEROBIC Blood Culture results may not be optimal due to an inadequate volume of blood received in culture bottles   Culture   Final    NO GROWTH < 24 HOURS Performed at [facility], [address] [place], [place] [address]    Report Status PENDING  Incomplete  Respiratory (~20 pathogens) panel by PCR     Status: None   Collection Time: 01/25/24  3:34 PM  Result Value Ref Range Status   Adenovirus NOT DETECTED NOT DETECTED Final   Coronavirus 229E NOT DETECTED NOT DETECTED Final    Comment: (NOTE) The Coronavirus on the Respiratory Panel, DOES NOT test for the novel  Coronavirus (2019 nCoV)    Coronavirus HKU1 NOT DETECTED NOT DETECTED Final   Coronavirus NL63 NOT DETECTED NOT DETECTED Final   Coronavirus OC43 NOT DETECTED NOT DETECTED Final   Metapneumovirus NOT DETECTED NOT DETECTED Final   Rhinovirus / Enterovirus NOT DETECTED NOT DETECTED Final   Influenza A NOT DETECTED NOT DETECTED Final   Influenza B NOT DETECTED NOT DETECTED Final   Parainfluenza Virus 1 NOT DETECTED NOT DETECTED Final   Parainfluenza Virus 2 NOT DETECTED NOT DETECTED Final   Parainfluenza Virus 3 NOT DETECTED NOT DETECTED Final   Parainfluenza Virus 4 NOT DETECTED NOT DETECTED Final   Respiratory  Syncytial Virus NOT DETECTED NOT DETECTED Final   Bordetella pertussis NOT DETECTED NOT DETECTED Final   Bordetella Parapertussis NOT DETECTED NOT DETECTED Final   Chlamydophila pneumoniae NOT DETECTED NOT DETECTED Final   Mycoplasma pneumoniae NOT DETECTED NOT DETECTED Final    Comment: Performed at [facility], [address] [place], [place] [address]  SARS Coronavirus 2 by RT PCR (hospital  order, performed in [facility] hospital lab) *cepheid single result test*     Status: None   Collection Time: 01/25/24  3:34 PM  Result Value Ref Range Status   SARS Coronavirus 2 by RT PCR NEGATIVE NEGATIVE Final    Comment: (NOTE) SARS-CoV-2 target nucleic acids are NOT DETECTED.  The SARS-CoV-2 RNA is generally detectable in upper and lower respiratory specimens during the acute phase of infection. The lowest concentration of SARS-CoV-2 viral copies this assay can detect is 250 copies / mL. A negative result does not preclude SARS-CoV-2 infection and should not be used as the sole basis for treatment or other patient management decisions.  A negative result may occur with improper specimen collection / handling, submission of specimen other than nasopharyngeal swab, presence of viral mutation(s) within the areas targeted by this assay, and inadequate number of viral copies (<250 copies / mL). A negative result must be combined with clinical observations, patient history, and epidemiological information.  Fact Sheet for Patients:   RoadLapTop.co.za  Fact Sheet for Healthcare Providers: http://kim-miller.com/  This test is not yet approved or  cleared by the [place]  FDA and has been authorized for detection and/or diagnosis of SARS-CoV-2 by FDA under an Emergency Use Authorization (EUA).  This EUA will remain in effect (meaning this test can be used) for the duration of the COVID-19 declaration under Section 564(b)(1) of the Act, 21 U.S.C. section 360bbb-3(b)(1), unless the authorization is terminated or revoked sooner.  Performed at [facility], [address] [place], [place] [address]   MRSA Next Gen by PCR, Nasal     Status: None   Collection Time: 01/25/24  3:46 PM   Specimen: Nasal Mucosa; Nasal Swab  Result Value Ref Range Status   MRSA by PCR Next Gen NOT DETECTED NOT DETECTED Final    Comment:  (NOTE) The GeneXpert MRSA Assay (FDA approved for NASAL specimens only), is one component of a comprehensive MRSA colonization surveillance program. It is not intended to diagnose MRSA infection nor to guide or monitor treatment for MRSA infections. Test performance is not FDA approved in patients less than 31 years old. Performed at [facility], [address] [place], [place] [address]   MRSA Next Gen by PCR, Nasal     Status: None   Collection Time: 01/25/24  4:42 PM   Specimen: Nasal Mucosa; Nasal Swab  Result Value Ref Range Status   MRSA by PCR Next Gen NOT DETECTED NOT DETECTED Final    Comment: (NOTE) The GeneXpert MRSA Assay (FDA approved for NASAL specimens only), is one component of a comprehensive MRSA colonization surveillance program. It is not intended to diagnose MRSA infection nor to guide or monitor treatment for MRSA infections. Test performance is not FDA approved in patients less than 64 years old. Performed at [facility], [address], [place], [place] [address]     Anti-infectives:  Anti-infectives (From admission, onward)    Start     Dose/Rate Route Frequency Ordered Stop   01/26/24 1000  cefTRIAXone  (ROCEPHIN ) 2 g in sodium chloride  0.9 %  100 mL IVPB        2 g 200 mL/hr over 30 Minutes Intravenous Every 24 hours 01/26/24 0744 01/30/24 0959   01/26/24 1000  azithromycin  (ZITHROMAX ) 500 mg in sodium chloride  0.9 % 250 mL IVPB        500 mg 250 mL/hr over 60 Minutes Intravenous Every 24 hours 01/26/24 0744 01/28/24 0959   01/25/24 1345  cefTRIAXone  (ROCEPHIN ) 1 g in sodium chloride  0.9 % 100 mL IVPB        1 g 200 mL/hr over 30 Minutes Intravenous  Once 01/25/24 1341 01/25/24 1431   01/25/24 1345  azithromycin  (ZITHROMAX ) 500 mg in sodium chloride  0.9 % 250 mL IVPB        500 mg 250 mL/hr over 60 Minutes Intravenous  Once 01/25/24 1341 01/25/24 1549         PAST MEDICAL HISTORY   Past Medical History:  Diagnosis Date    Anemia    Bruises easily    Diabetes mellitus without complication (HCC)    H/O esophagogastroduodenoscopy    H/O hiatal hernia    History of blood transfusion    no reaction noted   History of bronchitis    last time a couple of yrs ago   History of gastric ulcer    History of MRSA infection    Hyperlipidemia    Hypertension    Leg cramps    takes quinine  prn   Melanoma (HCC)    Panic attacks    Pneumonia    hx of last time a couple of yrs ago   PONV (postoperative nausea and vomiting)    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 laminectomy Dr. Louis 2008 cervical fusion x 2   bilateral cataract surgery     CHOLECYSTECTOMY  1970   COLONOSCOPY     CORONARY ANGIOPLASTY     CORONARY ARTERY BYPASS GRAFT  2000    3 vessels   ESOPHAGOGASTRODUODENOSCOPY (EGD) WITH PROPOFOL  N/A 11/18/2017   Procedure: ESOPHAGOGASTRODUODENOSCOPY (EGD) WITH PROPOFOL ;  Surgeon: Therisa Bi, MD;  Location: [facility];  Service: Gastroenterology;  Laterality: N/A;   GANGLION CYST EXCISION Right 10/22/2022   Dr. Kathlynn   JOINT REPLACEMENT     MOHS SURGERY Left 12/30/2018   [facility], Dorn Ahle, MD   NECK SURGERY     x 2   prostate laser surgery     x 2   REPLACEMENT TOTAL KNEE BILATERAL  '86 and 2000   REVERSE SHOULDER ARTHROPLASTY  05/30/2012   Procedure: REVERSE SHOULDER ARTHROPLASTY;  Surgeon: Elspeth JONELLE Her, MD;  Location: [facility] OR;  Service: Orthopedics;  Laterality: Right;  right reverse shoulder arthroplasty   SQUAMOUS CELL CARCINOMA EXCISION   05/01/2019   mid superior forehead/ [facility]/ Dr. Dorn L. Cook   TOTAL HIP ARTHROPLASTY Right 2012   [facility]     FAMILY HISTORY   Family History  Problem Relation Age of Onset   Alzheimer's disease Mother    Heart attack Father    Bipolar disorder Brother  Kidney disease Neg Hx    Prostate cancer Neg Hx      SOCIAL HISTORY   Social History   Tobacco Use   Smoking status: Former    Current packs/day: 0.00    Average packs/day: 1 pack/day for 25.0 years (25.0 ttl pk-yrs)    Types: Cigarettes    Start date: 07/10/1943    Quit date: 07/09/1968    Years since quitting: 55.5    Passive exposure: Past   Smokeless tobacco: Never   Tobacco comments:    quit at age 46  Vaping Use   Vaping status: Never Used  Substance Use Topics   Alcohol use: No    Alcohol/week: 0.0 standard drinks of alcohol   Drug use: No     MEDICATIONS   Current Medication:  Current Facility-Administered Medications:    azithromycin  (ZITHROMAX ) 500 mg in sodium chloride  0.9 % 250 mL IVPB, 500 mg, Intravenous, Q24H, Tanda Morrissey, MD   cefTRIAXone  (ROCEPHIN ) 2 g in sodium chloride  0.9 % 100 mL IVPB, 2 g, Intravenous, Q24H, Marriana Hibberd, MD   Chlorhexidine  Gluconate Cloth 2 % PADS 6 each, 6 each, Topical, Daily, Kiya Eno, MD, 6 each at 01/25/24 1639   docusate sodium  (COLACE) capsule 100 mg, 100 mg, Oral, BID PRN, Dontray Haberland, MD   DULoxetine  (CYMBALTA ) DR capsule 30 mg, 30 mg, Oral, Daily, Ouma, Almarie Bake, NP, 30 mg at 01/25/24 2104   fluticasone  furoate-vilanterol (BREO ELLIPTA ) 100-25 MCG/ACT 1 puff, 1 puff, Inhalation, Daily, Crist Kruszka, MD, 1 puff at 01/26/24 0844   gabapentin  (NEURONTIN ) capsule 300 mg, 300 mg, Oral, TID, Ouma, Elizabeth Achieng, NP, 300 mg at 01/25/24 2104   Gerhardt's butt cream, , Topical, TID, Lasonja Lakins, MD, Given at 01/26/24 9370   heparin  injection 5,000 Units, 5,000 Units, Subcutaneous, Q8H, Jolette Lana, MD, 5,000 Units at  01/26/24 0515   HYDROcodone -acetaminophen  (NORCO/VICODIN) 5-325 MG per tablet 1-2 tablet, 1-2 tablet, Oral, BID PRN, Ouma, Elizabeth Achieng, NP   ipratropium-albuterol  (DUONEB) 0.5-2.5 (3) MG/3ML nebulizer solution 3 mL, 3 mL, Nebulization, Q6H PRN, Althea Backs, MD   levothyroxine  (SYNTHROID ) tablet 50 mcg, 50 mcg, Oral, Daily, Ouma, Almarie Bake, NP, 50 mcg at 01/26/24 0515   midodrine  (PROAMATINE ) tablet 10 mg, 10 mg, Oral, TID WC, Everado Pillsbury, MD, 10 mg at 01/26/24 9157   norepinephrine  (LEVOPHED ) 4mg  in (0.016 mg/mL) premix infusion, 0-40 mcg/min, Intravenous, Continuous, Dorothyann Drivers, MD, Stopped at 01/26/24 0331   Oral care mouth rinse, 15 mL, Mouth Rinse, PRN, Ouma, Almarie Bake, NP   polyethylene glycol (MIRALAX  / GLYCOLAX ) packet 17 g, 17 g, Oral, Daily PRN, Sihaam Chrobak, MD   simvastatin  (ZOCOR ) tablet 40 mg, 40 mg, Oral, QHS, Ouma, Almarie Bake, NP, 40 mg at 01/25/24 2222    ALLERGIES   Meperidine, Propoxyphene, Codeine sulfate, Darvon [propoxyphene hcl], and Oxycodone     REVIEW OF SYSTEMS    10 point ROS conducted and is negative except for fatigue, cough, anorexia  PHYSICAL EXAMINATION   Vital Signs: Temp:  [94.3 F (34.6 C)-98.6 F (37 C)] 98.6 F (37 C) (07/20 0730) Pulse Rate:  [68-91] 83 (07/20 0730) Resp:  [10-22] 12 (07/20 0730) BP: (68-136)/(39-91) 110/58 (07/20 0730) SpO2:  [89 %-100 %] 95 % (07/20 0730) Weight:  [94.8 kg-101.3 kg] 101.3 kg (07/20 0430)  GENERAL:mild distress due to acutely ill status, Age appropriate HEAD: Normocephalic, atraumatic.  EYES: Pupils equal, round, reactive to light.  No scleral icterus.  MOUTH: Moist mucosal membrane. NECK: Supple. No thyromegaly.  No nodules. No JVD.  PULMONARY: rhonchi on right side with decreased air entry CARDIOVASCULAR: S1 and S2. Regular rate and rhythm. No murmurs, rubs, or gallops.  GASTROINTESTINAL: Soft, nontender, non-distended. No masses. Positive bowel  sounds. No hepatosplenomegaly.  MUSCULOSKELETAL: No swelling, clubbing, or edema.  NEUROLOGIC: Mild distress due to acute illness SKIN:intact,warm,dry   PERTINENT DATA     Infusions:  azithromycin  (ZITHROMAX ) 500 mg in sodium chloride  0.9 % 250 mL IVPB     cefTRIAXone  (ROCEPHIN )  IV     norepinephrine  (LEVOPHED ) Adult infusion Stopped (01/26/24 0331)   Scheduled Medications:  Chlorhexidine  Gluconate Cloth  6 each Topical Daily   DULoxetine   30 mg Oral Daily   fluticasone  furoate-vilanterol  1 puff Inhalation Daily   gabapentin   300 mg Oral TID   Gerhardt's butt cream   Topical TID   heparin   5,000 Units Subcutaneous Q8H   levothyroxine   50 mcg Oral Daily   midodrine   10 mg Oral TID WC   simvastatin   40 mg Oral QHS   PRN Medications: docusate sodium , HYDROcodone -acetaminophen , ipratropium-albuterol , mouth rinse, polyethylene glycol Hemodynamic parameters:   Intake/Output: 07/19 0701 - 07/20 0700 In: 1046.5 [I.V.:452.2; IV Piggyback:594.3] Out: 2250 [Urine:2250]  Ventilator  Settings:     LAB RESULTS:  Basic Metabolic Panel: Recent Labs  Lab 01/25/24 1246 01/26/24 0421  NA 134* 131*  K 4.9 4.9  CL 99 102  CO2 21* 18*  GLUCOSE 87 110*  BUN 80* 65*  CREATININE 5.49* 4.36*  CALCIUM 8.4* 8.2*  MG  --  2.1  PHOS  --  5.0*   Liver Function Tests: Recent Labs  Lab 01/25/24 1246 01/26/24 0421  AST 24  --   ALT 23  --   ALKPHOS 61  --   BILITOT 0.4  --   PROT 7.2  --   ALBUMIN 3.6 2.8*   No results for input(s): LIPASE, AMYLASE in the last 168 hours. No results for input(s): AMMONIA in the last 168 hours. CBC: Recent Labs  Lab 01/25/24 1246  WBC 6.2  NEUTROABS 4.0  HGB 10.1*  HCT 31.7*  MCV 98.8  PLT 107*   Cardiac Enzymes: No results for input(s): CKTOTAL, CKMB, CKMBINDEX, TROPONINI in the last 168 hours. BNP: Invalid input(s): POCBNP CBG: No results for input(s): GLUCAP in the last 168 hours.     IMAGING  RESULTS:     ASSESSMENT AND PLAN    -Multidisciplinary rounds held today  Acute Hypoxic Respiratory Failure- resolved  -suspect due to Pneumonia with CXR showing infiltrate on Right lower lung zone -background of COPD with chronic bronchitic phenotype -MRSA PCR -Procalcitoinin trend -CRP daily  -empiric CAP coverage Rocephin  Zithromax  -IS and flutter for chest physiotherapy -VBG -RVP -COVID/RSV/Influenza testing   2. Septic shock - present on admission -resolved     - due to either UTI or pneumonia     -history of urine culture with >100,000 colonies of   Bacteria Klebsiella aerogenes Abnormal   -perform blood cultures -urine cultures  -urinalysis -continue Rocephin  zithromax  for now -lactate trend -IVF - judicious use due to CHF   3. Renal Failure-Acute on chronic -KDIGO 4 -nephrology consult -US  renal  -dc nonessential nephrotoxic meds -follow chem 7 -follow UO -continue Foley Catheter-assess need daily   4. COPD with chronic bronchitic phenotype    - continue Duoneb q6h prn    - IS and flutter device    - Breo ellipta    5. Obstructive sleep apnea     -  CPAP at bedtime - RT for settings   6. Neuropathy   Continue gabapentin  and cymbalta    7. ID -continue IV abx as prescibed -follow up cultures  8. GI/Nutrition GI PROPHYLAXIS as indicated- on prilosec at home for GERD  DIET-->TF's as tolerated Constipation protocol as indicated  9. ENDO - ICU hypoglycemic\Hyperglycemia protocol -check FSBS per protocol   10. ELECTROLYTES -follow labs as needed -replace as needed -pharmacy consultation   11. DVT/GI PRX ordered -SCDs  TRANSFUSIONS AS NEEDED MONITOR FSBS ASSESS the need for LABS as needed    Critical care provider statement:   Total critical care time: 32 minutes   Performed by: Parris MD   Critical care time was exclusive of separately billable procedures and treating other patients.   Critical care was necessary to treat  or prevent imminent or life-threatening deterioration.   Critical care was time spent personally by me on the following activities: development of treatment plan with patient and/or surrogate as well as nursing, discussions with consultants, evaluation of patient's response to treatment, examination of patient, obtaining history from patient or surrogate, ordering and performing treatments and interventions, ordering and review of laboratory studies, ordering and review of radiographic studies, pulse oximetry and re-evaluation of patient's condition.    Briggette Najarian, M.D.  Pulmonary & Critical Care Medicine

## 2024-01-26 NOTE — Evaluation (Signed)
 Occupational Therapy Evaluation Patient Details Name: Mark Terry. MRN: [id number] DOB: [birth year] Today's Date: 01/26/2024   History of Present Illness   89 y.o. male admitted with malaise and undue fatigue noted to have pneumonia, sepsis, UTI and AKI on CKD. PMHx of hypertension, coronary artery disease, congestive heart failure, COPD, obstructive sleep apnea on CPAP, diabetes, peripheral vascular disease, chronic kidney disease with anemia, HTN, bil RTC repair and shoulder replacements     Clinical Impressions Pt was seen for OT evaluation this date. PTA, pt resides at [facility] where he lives alone. He is W/C bound, utilizing PWC for all mobility. He is typically MOD I with bed mobility and SPT using RW to his PWC, then from [facility] to toilet, BSC, or lift chair. He reports he rides to [facility] down to the dining room for all meals, manages his own meds. He has a lady come clean his apartment 1x/week. His two daughters lives 2 hrs away and he will have to may for more assist at ILF if needed. Reports 2 falls in the last 6 months.  Pt presents to acute OT demonstrating impaired ADL performance and functional mobility 2/2 weakness, pain, poor balance and activity tolerance.  Pt currently requires Max to Mod A for all bed mobility. He reports chronic L hip pain and inability to move it on his own for last several days and required full assist to bring to EOB and back to supine this date. He is able to extend knees while seated at EOB. H/o bil shoulder replacements with L shoulder tightness and pain. He sat up at EOB x20-25 mins this date with unilateral support of UE d/t high bed height with CGA. Seated oral care performed with CGA for safety.  Pt would benefit from skilled OT services to address noted impairments and functional limitations to maximize safety and independence while minimizing falls risk and caregiver burden. Do anticipate the need for follow up OT services upon acute hospital DC <3  hrs/day. Pt is hopeful to be able to transition to go back to his ILF or go to ALF side. Edu on need for hospital bed and increased assist at this time with all tasks requiring further therapy.      If plan is discharge home, recommend the following:   A lot of help with walking and/or transfers;A lot of help with bathing/dressing/bathroom;Assistance with cooking/housework;Assist for transportation     Functional Status Assessment   Patient has had a recent decline in their functional status and demonstrates the ability to make significant improvements in function in a reasonable and predictable amount of time.     Equipment Recommendations   Hospital bed     Recommendations for Other Services         Precautions/Restrictions   Precautions Precautions: Fall Recall of Precautions/Restrictions: Intact Restrictions Weight Bearing Restrictions Per Provider Order: No     Mobility Bed Mobility Overal bed mobility: Needs Assistance Bed Mobility: Supine to Sit, Sit to Supine     Supine to sit: Mod assist, HOB elevated, Used rails, Max assist Sit to supine: Max assist   General bed mobility comments: MOD/Max A to reach upright position at EBO for trunkal elevation, cueing for bed rails use and LLE management; Max A for return to supine for BLE management and assist to reposition pt's trunk and Max A x2 for scooting to [facility] with bed in trendelenburg position; pt unable to move LLE on his own for bed mobility this date  d/t pain    Transfers                   General transfer comment: pt declined this date      Balance Overall balance assessment: Needs assistance Sitting-balance support: Feet unsupported, Single extremity supported Sitting balance-Leahy Scale: Fair Sitting balance - Comments: CGA at EOB                                   ADL either performed or assessed with clinical judgement   ADL Overall ADL's : Needs assistance/impaired      Grooming: Oral care;Sitting;Supervision/safety                                 General ADL Comments: CGA for seated balance at EOB, feet dangle d/t high height of bed and pt height     Vision         Perception         Praxis         Pertinent Vitals/Pain Pain Assessment Pain Assessment: 0-10 Pain Score: 8  Pain Location: L hip chronic pain also has L shoulder tightness Pain Descriptors / Indicators: Aching, Discomfort, Grimacing, Guarding Pain Intervention(s): Monitored during session, Repositioned, Limited activity within patient's tolerance     Extremity/Trunk Assessment Upper Extremity Assessment Upper Extremity Assessment: Generalized weakness;Left hand dominant;LUE deficits/detail LUE Deficits / Details: minimal AROM at shoulder from previous bil replacements, reports tightness LUE: Shoulder pain with ROM   Lower Extremity Assessment Lower Extremity Assessment: Generalized weakness;LLE deficits/detail LLE Deficits / Details: L hip pain with very little active movement of LLE during session, able to extend knees at EOB       Communication Communication Communication: No apparent difficulties   Cognition Arousal: Alert Behavior During Therapy: WFL for tasks assessed/performed Cognition: No apparent impairments                               Following commands: Intact       Cueing  General Comments   Cueing Techniques: Verbal cues  VSS throughout session on 3L 02 via [place]   Exercises Other Exercises Other Exercises: Edu on role of OT in acute setting and DC recommendations.   Shoulder Instructions      Home Living Family/patient expects to be discharged to:: Private residence Living Arrangements: Alone Available Help at Discharge:  (daughters live 2 hrs away and he would have to pay more for assist) Type of Home: Independent living facility Home Access: Level entry     Home Layout: One level     Bathroom  Shower/Tub: Producer, television/film/video: Handicapped height Bathroom Accessibility: Yes   Home Equipment: Hand held shower head;Shower seat - built in;Wheelchair - power;BSC/3in1;Grab bars - tub/shower;Grab bars - toilet;Lift chair          Prior Functioning/Environment Prior Level of Function : Independent/Modified Independent             Mobility Comments: uses motorized wheelchair for all mobility, facility provides all meals and he rides MWC to dining room, but can be brought to him if needed; 2 falls in last 6 months; has a lift chair he occasionally sits in ADLs Comments: MOD I with ADLs, uses shower or will sponge bathe; cannot shower with leg wrappings;  has a cleaning person 1x/wk; he manages his own meds    OT Problem List: Decreased strength;Decreased activity tolerance;Impaired balance (sitting and/or standing)   OT Treatment/Interventions: Self-care/ADL training;Therapeutic exercise;Patient/family education;Balance training;Energy conservation;Therapeutic activities;DME and/or AE instruction      OT Goals(Current goals can be found in the care plan section)   Acute Rehab OT Goals Patient Stated Goal: get better OT Goal Formulation: With patient Time For Goal Achievement: 02/09/24 Potential to Achieve Goals: Good ADL Goals Pt Will Perform Lower Body Bathing: with supervision;sit to/from stand;sitting/lateral leans Pt Will Perform Lower Body Dressing: with supervision;sit to/from stand;sitting/lateral leans Pt Will Transfer to Toilet: stand pivot transfer;bedside commode;with contact guard assist   OT Frequency:  Min 2X/week    Co-evaluation              AM-PAC OT 6 Clicks Daily Activity     Outcome Measure Help from another person eating meals?: None Help from another person taking care of personal grooming?: A Little Help from another person toileting, which includes using toliet, bedpan, or urinal?: A Lot Help from another person bathing  (including washing, rinsing, drying)?: A Lot Help from another person to put on and taking off regular upper body clothing?: A Little Help from another person to put on and taking off regular lower body clothing?: A Lot 6 Click Score: 16   End of Session Equipment Utilized During Treatment: Oxygen Nurse Communication: Mobility status  Activity Tolerance: Patient tolerated treatment well Patient left: in bed;with call bell/phone within reach;with bed alarm set  OT Visit Diagnosis: Other abnormalities of gait and mobility (R26.89);Unsteadiness on feet (R26.81);Muscle weakness (generalized) (M62.81);History of falling (Z91.81)                Time: 8481-8445 OT Time Calculation (min): 36 min Charges:  OT General Charges $OT Visit: 1 Visit OT Evaluation $OT Eval Moderate Complexity: 1 Mod OT Treatments $Self Care/Home Management : 8-22 mins Sanyla Summey, OTR/L  01/26/24, 5:14 PM  Kirtis Challis E Kamari Buch 01/26/2024, 5:09 PM

## 2024-01-26 NOTE — Progress Notes (Signed)
 [facility]  PROGRESS NOTE   Subjective:   Patient seen at bedside in the ICU.  Family at bedside.  He feels much better today.  Was able to eat breakfast today Urine output has been good.  Objective:  Vital signs: Blood pressure (!) 104/56, pulse 75, temperature 98.6 F (37 C), resp. rate 15, height 5' 1 (1.549 m), weight 101.3 kg, SpO2 96%.  Intake/Output Summary (Last 24 hours) at 01/26/2024 1345 Last data filed at 01/26/2024 0900 Gross per 24 hour  Intake 1286.52 ml  Output 2250 ml  Net -963.48 ml   Filed Weights   01/25/24 1221 01/25/24 1639 01/26/24 0430  Weight: 94.8 kg 100.9 kg 101.3 kg     Physical Exam: General:  No acute distress  Head:  Normocephalic, atraumatic. Moist oral mucosal membranes  Eyes:  Anicteric  Neck:  Supple  Lungs:   Clear to auscultation, normal effort  Heart:  S1S2 no rubs  Abdomen:   Soft, nontender, bowel sounds present  Extremities:  peripheral edema.  Neurologic:  Awake, alert, following commands  Skin:  No lesions  Access:     Basic Metabolic Panel: Recent Labs  Lab 01/25/24 1246 01/26/24 0421  NA 134* 131*  K 4.9 4.9  CL 99 102  CO2 21* 18*  GLUCOSE 87 110*  BUN 80* 65*  CREATININE 5.49* 4.36*  CALCIUM 8.4* 8.2*  MG  --  2.1  PHOS  --  5.0*   GFR: Estimated Creatinine Clearance: 12.1 mL/min (A) (by C-G formula based on SCr of 4.36 mg/dL (H)).  Liver Function Tests: Recent Labs  Lab 01/25/24 1246 01/26/24 0421  AST 24  --   ALT 23  --   ALKPHOS 61  --   BILITOT 0.4  --   PROT 7.2  --   ALBUMIN 3.6 2.8*   No results for input(s): LIPASE, AMYLASE in the last 168 hours. No results for input(s): AMMONIA in the last 168 hours.  CBC: Recent Labs  Lab 01/25/24 1246  WBC 6.2  NEUTROABS 4.0  HGB 10.1*  HCT 31.7*  MCV 98.8  PLT 107*     HbA1C: Hemoglobin A1C  Date/Time Value Ref Range Status  01/15/2024 11:29 AM 7.5 (A) 4.0 - 5.6 % Final   Hgb A1c MFr Bld  Date/Time Value Ref  Range Status  09/25/2023 08:32 AM 7.9 (H) 4.8 - 5.6 % Final    Comment:             Prediabetes: 5.7 - 6.4          Diabetes: >6.4          Glycemic control for adults with diabetes: <7.0   06/28/2023 11:34 AM 7.8 (H) 4.8 - 5.6 % Final    Comment:             Prediabetes: 5.7 - 6.4          Diabetes: >6.4          Glycemic control for adults with diabetes: <7.0     Urinalysis: Recent Labs    01/25/24 1533  COLORURINE YELLOW*  LABSPEC 1.017  PHURINE 5.0  GLUCOSEU 150*  HGBUR NEGATIVE  BILIRUBINUR NEGATIVE  KETONESUR NEGATIVE  PROTEINUR NEGATIVE  NITRITE NEGATIVE  LEUKOCYTESUR NEGATIVE      Imaging: US  RENAL Result Date: 01/25/2024 CLINICAL DATA:  Acute kidney injury. EXAM: RENAL / URINARY TRACT ULTRASOUND COMPLETE COMPARISON:  Dec 06, 2023 FINDINGS: Right Kidney: Renal measurements: 9.3 cm x 6.0 cm x  5.1 cm = volume: 147 mL. Thinning of the renal cortex is noted. Mildly increased echogenicity of the renal parenchyma is noted. No mass or hydronephrosis visualized. Left Kidney: Renal measurements: 11.2 cm x 5.8 cm x 5.0 cm = volume: 170.5 mL. Echogenicity within normal limits. A stable 2.6 cm x 2.8 cm x 2.0 cm renal cyst is seen within the lateral aspect of the mid left kidney. No hydronephrosis is visualized. Bladder: A Foley catheter is present within the urinary bladder. Other: None. IMPRESSION: 1. Mildly echogenic right kidney with mild thinning of the renal cortex which may represent sequelae associated with medical renal disease. 2. Stable left renal cyst. Electronically Signed   By: Suzen Dials M.D.   On: 01/25/2024 21:24   DG Chest 2 View Result Date: 01/25/2024 CLINICAL DATA:  Hypotension.  Suspected sepsis.  Urinary retention. EXAM: CHEST - 2 VIEW COMPARISON:  06/15/2016. FINDINGS: Stable changes from previous CABG surgery. Cardiac silhouette is normal in size and configuration. No mediastinal or hilar masses or convincing adenopathy. There is consolidation at the  right lung base, posteromedial aspect of the right lower lobe. Few linear opacities are noted at the left lung base and left mid lung consistent with atelectasis/scarring. Remainder of the lungs is clear. No convincing pleural effusion and no pneumothorax. IMPRESSION: Right lower lobe pneumonia. Electronically Signed   By: Alm Parkins M.D.   On: 01/25/2024 13:14     Medications:    azithromycin  (ZITHROMAX ) 500 mg in sodium chloride  0.9 % 250 mL IVPB 500 mg (01/26/24 1300)   cefTRIAXone  (ROCEPHIN )  IV 2 g (01/26/24 1221)   norepinephrine  (LEVOPHED ) Adult infusion Stopped (01/26/24 0331)    Chlorhexidine  Gluconate Cloth  6 each Topical Nightly   DULoxetine   30 mg Oral Daily   fluticasone  furoate-vilanterol  1 puff Inhalation Daily   gabapentin   300 mg Oral TID   Gerhardt's butt cream   Topical TID   heparin   5,000 Units Subcutaneous Q8H   levothyroxine   50 mcg Oral Daily   midodrine   10 mg Oral TID WC   simvastatin   40 mg Oral QHS    Assessment/ Plan:     89 y.o. male with a PMHx of hypertension, coronary artery disease, congestive heart failure, COPD, obstructive sleep apnea on CPAP, diabetes, peripheral vascular disease, chronic kidney disease with anemia admitted with malaise and undue fatigue. He had chest x-ray showing pneumonia.  He was started on Rocephin  and Zithromax .  He also has acute kidney injury on chronic kidney disease. He was given IV fluids with normal saline x 2 L.   #1: Acute kidney injury on chronic kidney disease: Patient is at baseline creatinine 2.4 with EGFR of 27 cc/min.  Today's creatinine is 4.36 with a GFR of less than 12 cc/min.  Monitor urine output.  His renal sonogram was negative. The acute kidney injury might be secondary to ATN secondary to sepsis and hypoperfusion.     #2: Sepsis/UTI/pneumonia: Continue Zithromax  and Rocephin .   #3: Hypotension: Continue Levophed .  Will try to wean as tolerated.  Continue IV fluids with isotonic saline at 75 cc an  hour.  #4: Chronic urinary tract infection: We need to avoid Bactrim  but may consider nitrofurantoin on a low-dose ciprofloxacin .  Spoke to the family at bedside. I have answered all their questions to the satisfaction Labs and medications reviewed. Will continue to follow along with you.   LOS: 1 Pinkey Edman, MD [facility] 7/20/20251:45 PM

## 2024-01-26 NOTE — Consult Note (Signed)
 WOC consulted for sacral/spine wound 7/19, see wound care orders.    Re consult if needed, will not follow at this time. Thanks  Julias Mould [employer], RN,CWOCN, CNS, [employer] [phone]

## 2024-01-26 NOTE — Plan of Care (Signed)

## 2024-01-27 ENCOUNTER — Ambulatory Visit: Admitting: Family Medicine

## 2024-01-27 ENCOUNTER — Inpatient Hospital Stay

## 2024-01-27 DIAGNOSIS — R0902 Hypoxemia: Secondary | ICD-10-CM

## 2024-01-27 DIAGNOSIS — L8990 Pressure ulcer of unspecified site, unspecified stage: Secondary | ICD-10-CM | POA: Insufficient documentation

## 2024-01-27 DIAGNOSIS — J189 Pneumonia, unspecified organism: Secondary | ICD-10-CM

## 2024-01-27 DIAGNOSIS — N179 Acute kidney failure, unspecified: Secondary | ICD-10-CM

## 2024-01-27 DIAGNOSIS — R579 Shock, unspecified: Secondary | ICD-10-CM

## 2024-01-27 LAB — CBC WITH DIFFERENTIAL/PLATELET
Abs Immature Granulocytes: 0.09 K/uL — ABNORMAL HIGH (ref 0.00–0.07)
Basophils Absolute: 0 K/uL (ref 0.0–0.1)
Basophils Relative: 0 %
Eosinophils Absolute: 0.1 K/uL (ref 0.0–0.5)
Eosinophils Relative: 1 %
HCT: 30.5 % — ABNORMAL LOW (ref 39.0–52.0)
Hemoglobin: 9.7 g/dL — ABNORMAL LOW (ref 13.0–17.0)
Immature Granulocytes: 2 %
Lymphocytes Relative: 26 %
Lymphs Abs: 1.2 K/uL (ref 0.7–4.0)
MCH: 31.7 pg (ref 26.0–34.0)
MCHC: 31.8 g/dL (ref 30.0–36.0)
MCV: 99.7 fL (ref 80.0–100.0)
Monocytes Absolute: 0.6 K/uL (ref 0.1–1.0)
Monocytes Relative: 12 %
Neutro Abs: 2.8 K/uL (ref 1.7–7.7)
Neutrophils Relative %: 59 %
Platelets: 102 K/uL — ABNORMAL LOW (ref 150–400)
RBC: 3.06 MIL/uL — ABNORMAL LOW (ref 4.22–5.81)
RDW: 16 % — ABNORMAL HIGH (ref 11.5–15.5)
WBC: 4.7 K/uL (ref 4.0–10.5)
nRBC: 0 % (ref 0.0–0.2)

## 2024-01-27 LAB — GLUCOSE, CAPILLARY: Glucose-Capillary: 93 mg/dL (ref 70–99)

## 2024-01-27 LAB — C-REACTIVE PROTEIN: CRP: 2.6 mg/dL — ABNORMAL HIGH (ref <1.0)

## 2024-01-27 LAB — RENAL FUNCTION PANEL
Albumin: 2.9 g/dL — ABNORMAL LOW (ref 3.5–5.0)
Anion gap: 8 (ref 5–15)
BUN: 54 mg/dL — ABNORMAL HIGH (ref 8–23)
CO2: 22 mmol/L (ref 22–32)
Calcium: 8.8 mg/dL — ABNORMAL LOW (ref 8.9–10.3)
Chloride: 106 mmol/L (ref 98–111)
Creatinine, Ser: 2.97 mg/dL — ABNORMAL HIGH (ref 0.61–1.24)
GFR, Estimated: 20 mL/min — ABNORMAL LOW (ref >60)
Glucose, Bld: 114 mg/dL — ABNORMAL HIGH (ref 70–99)
Phosphorus: 3.6 mg/dL (ref 2.5–4.6)
Potassium: 5.2 mmol/L — ABNORMAL HIGH (ref 3.5–5.1)
Sodium: 136 mmol/L (ref 135–145)

## 2024-01-27 LAB — MAGNESIUM: Magnesium: 2.3 mg/dL (ref 1.7–2.4)

## 2024-01-27 MED ORDER — PANTOPRAZOLE SODIUM 40 MG PO TBEC
40.0000 mg | DELAYED_RELEASE_TABLET | Freq: Every day | ORAL | Status: DC
  Administered 2024-01-27 – 2024-01-28 (×2): 40 mg via ORAL
  Filled 2024-01-27 (×2): qty 1

## 2024-01-27 MED ORDER — TRAZODONE HCL 50 MG PO TABS
50.0000 mg | ORAL_TABLET | Freq: Every evening | ORAL | Status: DC | PRN
  Administered 2024-01-27: 100 mg via ORAL
  Filled 2024-01-27: qty 2
  Filled 2024-01-27: qty 1

## 2024-01-27 MED ORDER — MIDODRINE HCL 5 MG PO TABS
5.0000 mg | ORAL_TABLET | Freq: Three times a day (TID) | ORAL | Status: DC
  Administered 2024-01-27: 5 mg via ORAL
  Filled 2024-01-27: qty 1

## 2024-01-27 MED ORDER — MIDODRINE HCL 5 MG PO TABS
2.5000 mg | ORAL_TABLET | Freq: Three times a day (TID) | ORAL | Status: DC
  Administered 2024-01-27 – 2024-01-28 (×3): 2.5 mg via ORAL
  Filled 2024-01-27 (×3): qty 1

## 2024-01-27 MED ORDER — VITAMIN D (ERGOCALCIFEROL) 1.25 MG (50000 UNIT) PO CAPS
50000.0000 [IU] | ORAL_CAPSULE | ORAL | Status: DC
  Administered 2024-01-27: 50000 [IU] via ORAL
  Filled 2024-01-27: qty 1

## 2024-01-27 MED ORDER — ASPIRIN 81 MG PO TBEC
81.0000 mg | DELAYED_RELEASE_TABLET | Freq: Every day | ORAL | Status: DC
  Administered 2024-01-27 – 2024-01-28 (×2): 81 mg via ORAL
  Filled 2024-01-27 (×2): qty 1

## 2024-01-27 MED ORDER — HYDROCODONE-ACETAMINOPHEN 5-325 MG PO TABS
1.0000 | ORAL_TABLET | Freq: Four times a day (QID) | ORAL | Status: DC | PRN
  Administered 2024-01-27 (×2): 2 via ORAL
  Administered 2024-01-28: 1 via ORAL
  Administered 2024-01-28: 2 via ORAL
  Filled 2024-01-27: qty 1
  Filled 2024-01-27 (×3): qty 2
  Filled 2024-01-27: qty 1

## 2024-01-27 MED ORDER — LORATADINE 10 MG PO TABS
10.0000 mg | ORAL_TABLET | Freq: Every day | ORAL | Status: DC
  Administered 2024-01-27 – 2024-01-28 (×2): 10 mg via ORAL
  Filled 2024-01-27 (×2): qty 1

## 2024-01-27 MED ORDER — TRAMADOL HCL 50 MG PO TABS
50.0000 mg | ORAL_TABLET | Freq: Three times a day (TID) | ORAL | Status: DC | PRN
  Administered 2024-01-27: 50 mg via ORAL
  Filled 2024-01-27: qty 1

## 2024-01-27 MED ORDER — ADULT MULTIVITAMIN W/MINERALS CH
1.0000 | ORAL_TABLET | Freq: Every day | ORAL | Status: DC
  Administered 2024-01-27 – 2024-01-28 (×2): 1 via ORAL
  Filled 2024-01-27 (×2): qty 1

## 2024-01-27 MED ORDER — SALINE SPRAY 0.65 % NA SOLN: 1.0000 | NASAL | Status: DC | PRN

## 2024-01-27 MED ORDER — METOPROLOL TARTRATE 25 MG PO TABS
25.0000 mg | ORAL_TABLET | Freq: Two times a day (BID) | ORAL | Status: DC
  Administered 2024-01-27 – 2024-01-28 (×3): 25 mg via ORAL
  Filled 2024-01-27 (×3): qty 1

## 2024-01-27 MED ORDER — VITAMIN C 500 MG PO TABS
500.0000 mg | ORAL_TABLET | Freq: Two times a day (BID) | ORAL | Status: DC
  Administered 2024-01-27 – 2024-01-28 (×2): 500 mg via ORAL
  Filled 2024-01-27 (×2): qty 1

## 2024-01-27 MED ORDER — AMOXICILLIN-POT CLAVULANATE 500-125 MG PO TABS
1.0000 | ORAL_TABLET | Freq: Two times a day (BID) | ORAL | Status: DC
  Administered 2024-01-27 – 2024-01-28 (×2): 1 via ORAL
  Filled 2024-01-27 (×3): qty 1

## 2024-01-27 MED ORDER — ENSURE PLUS HIGH PROTEIN PO LIQD
237.0000 mL | Freq: Two times a day (BID) | ORAL | Status: DC
  Administered 2024-01-28: 237 mL via ORAL

## 2024-01-27 MED ORDER — OXYBUTYNIN CHLORIDE 5 MG PO TABS
5.0000 mg | ORAL_TABLET | Freq: Two times a day (BID) | ORAL | Status: DC
  Administered 2024-01-27 – 2024-01-28 (×2): 5 mg via ORAL
  Filled 2024-01-27 (×3): qty 1

## 2024-01-27 NOTE — Care Management Important Message (Signed)
 Important Message  Patient Details  Name: Mark Terry. MRN: [id number] Date of Birth: 06/02/1935   Important Message Given:  Yes - Medicare IM     Rojelio [name] 01/27/2024, 12:54 PM

## 2024-01-27 NOTE — Progress Notes (Incomplete)
 Patient transferred to room 240 via bed. All belongings transferred with patient including eyeglasses.

## 2024-01-27 NOTE — Plan of Care (Signed)

## 2024-01-27 NOTE — Progress Notes (Signed)
 Occupational Therapy Treatment Patient Details Name: Mark Terry. MRN: [id number] DOB: [birth year] Today's Date: 01/27/2024   History of present illness 89 y.o. male admitted with malaise and undue fatigue noted to have pneumonia, sepsis, UTI and AKI on CKD. PMHx of hypertension, coronary artery disease, congestive heart failure, COPD, obstructive sleep apnea on CPAP, diabetes, peripheral vascular disease, chronic kidney disease with anemia, HTN, bil RTC repair and shoulder replacements   OT comments  Pt is supine in bed on arrival. Pleasant and agreeable to OT tx session and PT eval. He continues to have L hip pain, but is better managed and able to progress his mobility. Pt performed bed mobility with Mod Ax2 for LLE management and trunkal elevation. Pt required CGA for seated balance progressing to SBA. He needed CGA x2 from lowest bed height to stand and initiate transfer to recliner, progressing to CGA x1 with good safety and no LOB. Pt performed seated oral care with set up assist. He feels he is safe to return to his ILF and is agreeable to increased assist from [facility] staff. Spoke with both daughters at bedside regarding recommendations for assist with bed mobility at his ILF, as well as sleeping in lift recliner for increased ease until he regains his strength. Secure chat sent to MD and CSW regarding HH therapy DC recommendations. Pt left in recliner with all needs in place and will cont to require skilled acute OT services to maximize his safety and IND to return to PLOF.       If plan is discharge home, recommend the following:  Assistance with cooking/housework;Assist for transportation;A little help with walking and/or transfers;A little help with bathing/dressing/bathroom   Equipment Recommendations  Hospital bed    Recommendations for Other Services      Precautions / Restrictions Precautions Precautions: Fall Recall of Precautions/Restrictions:  Intact Restrictions Weight Bearing Restrictions Per Provider Order: No       Mobility Bed Mobility Overal bed mobility: Needs Assistance Bed Mobility: Supine to Sit     Supine to sit: Mod assist, HOB elevated, Used rails, +2 for physical assistance     General bed mobility comments: moved BLEs better today towards EOB, min assist for LLE and Mod A for trunkal elevation; spoke with daughters about getting increased help to assist with bed mobility at [facility] and they will be able to arrange that    Transfers Overall transfer level: Needs assistance Equipment used: Rolling [place] (2 wheels) Transfers: Sit to/from Stand, Bed to chair/wheelchair/BSC Sit to Stand: Contact guard assist, +2 safety/equipment     Step pivot transfers: Contact guard assist     General transfer comment: CGA X2 for STS from EOB to RW and intially to perform SPT with progression to CGA x1     Balance Overall balance assessment: Needs assistance Sitting-balance support: Feet unsupported, Single extremity supported Sitting balance-Leahy Scale: Fair Sitting balance - Comments: CGA at EOB   Standing balance support: Bilateral upper extremity supported Standing balance-Leahy Scale: Fair Standing balance comment: standing at RW and for SPT                           ADL either performed or assessed with clinical judgement   ADL Overall ADL's : Needs assistance/impaired     Grooming: Oral care;Sitting;Set up Grooming Details (indicate cue type and reason): assist to open to toothpaste top-per dtrs this is baseline  Lower Body Dressing: Maximal assistance;Bed level Lower Body Dressing Details (indicate cue type and reason): to don bil socks Toilet Transfer: Contact guard assist;Rolling [place] (2 wheels) Toilet Transfer Details (indicate cue type and reason): step pivot to recliner using RW with CGA           General ADL Comments: CGA/SBA for seated balance, able to  stand from EOB with MIN/CGA and SPT to recliner with CGA using RW    Extremity/Trunk Assessment Upper Extremity Assessment Upper Extremity Assessment: Defer to OT evaluation   Lower Extremity Assessment Lower Extremity Assessment: Generalized weakness   Cervical / Trunk Assessment Cervical / Trunk Assessment: Kyphotic    Vision       Perception     Praxis     Communication Communication Communication: Impaired Factors Affecting Communication: Hearing impaired   Cognition Arousal: Alert Behavior During Therapy: WFL for tasks assessed/performed                                 Following commands: Intact        Cueing   Cueing Techniques: Verbal cues  Exercises Other Exercises Other Exercises: Spoke with both daughters at bedside regarding recommendations for assist with bed mobility at his ILF, as well as sleeping in lift recliner for increased ease until he regains his strength. Secure chat sent to MD and CSW regarding HH therapy DC recommendations.    Shoulder Instructions       General Comments placed on RA with stable sp02 throughout session    Pertinent Vitals/ Pain       Pain Assessment Pain Assessment: Faces Faces Pain Scale: Hurts little more Pain Location: L hip chronic pain Pain Descriptors / Indicators: Aching, Discomfort, Grimacing, Guarding Pain Intervention(s): Monitored during session, Repositioned  Home Living Family/patient expects to be discharged to:: Private residence Living Arrangements: Alone Available Help at Discharge: Family Type of Home: Independent living facility Home Access: Level entry     Home Layout: One level     Bathroom Shower/Tub: Producer, television/film/video: Handicapped height Bathroom Accessibility: Yes   Home Equipment: Hand held shower head;Shower seat - built in;Wheelchair - power;BSC/3in1;Grab bars - tub/shower;Grab bars - toilet;Lift chair          Prior Functioning/Environment               Frequency  Min 2X/week        Progress Toward Goals  OT Goals(current goals can now be found in the care plan section)  Progress towards OT goals: Progressing toward goals  Acute Rehab OT Goals Patient Stated Goal: go home OT Goal Formulation: With patient Time For Goal Achievement: 02/09/24 Potential to Achieve Goals: Good  Plan      Co-evaluation    PT/OT/SLP Co-Evaluation/Treatment: Yes Reason for Co-Treatment: To address functional/ADL transfers PT goals addressed during session: Mobility/safety with mobility;Balance;Proper use of DME;Strengthening/ROM OT goals addressed during session: ADL's and self-care      AM-PAC OT 6 Clicks Daily Activity     Outcome Measure   Help from another person eating meals?: None Help from another person taking care of personal grooming?: A Little Help from another person toileting, which includes using toliet, bedpan, or urinal?: A Lot Help from another person bathing (including washing, rinsing, drying)?: A Lot Help from another person to put on and taking off regular upper body clothing?: A Little Help from another person to put on  and taking off regular lower body clothing?: A Lot 6 Click Score: 16    End of Session Equipment Utilized During Treatment: Oxygen;Rolling [place] (2 wheels)  OT Visit Diagnosis: Other abnormalities of gait and mobility (R26.89);Unsteadiness on feet (R26.81);Muscle weakness (generalized) (M62.81);History of falling (Z91.81)   Activity Tolerance Patient tolerated treatment well   Patient Left with call bell/phone within reach;in chair;with chair alarm set   Nurse Communication Mobility status        Time: 8883-8853 OT Time Calculation (min): 30 min  Charges: OT General Charges $OT Visit: 1 Visit OT Treatments $Self Care/Home Management : 8-22 mins  Latiesha Harada, OTR/L  01/27/24, 1:28 PM   Cira Deyoe E Alucard Fearnow 01/27/2024, 1:19 PM

## 2024-01-27 NOTE — Evaluation (Signed)
 Physical Therapy Evaluation Patient Details Name: Mark Terry. MRN: [id number] DOB: 1935/05/15 Today's Date: 01/27/2024  History of Present Illness  89 y.o. male admitted with malaise and undue fatigue noted to have pneumonia, sepsis, UTI and AKI on CKD. PMHx of hypertension, coronary artery disease, congestive heart failure, COPD, obstructive sleep apnea on CPAP, diabetes, peripheral vascular disease, chronic kidney disease with anemia, HTN, bil RTC repair and shoulder replacements  Clinical Impression  Co-treat with OT this date.Pt is a pleasant 89 year old male who was admitted for shock circulatory. Pt vitals monitored during session, found in RA on 93%, staying 94-95% throughout remainder of session. Pt performs bed mobility with mod a x2 due to increased verbal and tactile cueing needed to complete mobility, pt complaining of pain during transitions. STS transfer with CGA x2 initially, able to progress to CGA x1, no LOB experienced. Pt demonstrates deficits with balance, strength, and activity tolerance impacting his current QoL. Amidst current physical state, pt's family in agreement to hire additional help alongside home PT/OT to best assist pt in his home environment. PT to follow acutely as appropriate.          If plan is discharge home, recommend the following: A little help with walking and/or transfers;A little help with bathing/dressing/bathroom;Assist for transportation;Help with stairs or ramp for entrance   Can travel by private vehicle        Equipment Recommendations None recommended by PT  Recommendations for Other Services       Functional Status Assessment Patient has had a recent decline in their functional status and demonstrates the ability to make significant improvements in function in a reasonable and predictable amount of time.     Precautions / Restrictions Precautions Precautions: Fall Recall of Precautions/Restrictions: Intact Restrictions Weight  Bearing Restrictions Per Provider Order: No      Mobility  Bed Mobility Overal bed mobility: Needs Assistance Bed Mobility: Supine to Sit     Supine to sit: Mod assist, HOB elevated, Used rails, +2 for physical assistance     General bed mobility comments: Mod A x2 to reach upright position at EBO for trunkal elevation, verbal/tactile cueing for bed rails use and LLE management    Transfers Overall transfer level: Needs assistance Equipment used: Rolling Chamberlain (2 wheels) Transfers: Sit to/from Stand, Bed to chair/wheelchair/BSC Sit to Stand: Contact guard assist, +2 safety/equipment   Step pivot transfers: Contact guard assist       General transfer comment: CGA x2 initially for safety but able to progress to x1 CGA, no LOB experienced    Ambulation/Gait               General Gait Details: non-ambulatory at baseline  Stairs            Wheelchair Mobility     Tilt Bed    Modified Rankin (Stroke Patients Only)       Balance Overall balance assessment: Needs assistance Sitting-balance support: Feet unsupported, Single extremity supported Sitting balance-Leahy Scale: Fair Sitting balance - Comments: CGA at EOB   Standing balance support: Bilateral upper extremity supported Standing balance-Leahy Scale: Fair Standing balance comment: standing with RW                             Pertinent Vitals/Pain Pain Assessment Pain Assessment: Faces Faces Pain Scale: Hurts little more Pain Location: L hip chronic pain also has L shoulder tightness Pain Descriptors / Indicators: Aching,  Discomfort, Grimacing, Guarding Pain Intervention(s): Monitored during session    Home Living Family/patient expects to be discharged to:: Private residence Living Arrangements: Alone Available Help at Discharge: Family Type of Home: Independent living facility Home Access: Level entry       Home Layout: One level Home Equipment: Hand held shower  head;Shower seat - built in;Wheelchair - power;BSC/3in1;Grab bars - tub/shower;Grab bars - toilet;Lift chair      Prior Function Prior Level of Function : Independent/Modified Independent             Mobility Comments: uses motorized wheelchair for all mobility, facility provides all meals and he rides MWC to dining room, but can be brought to him if needed; 2 falls in last 6 months; has a lift chair he occasionally sits in ADLs Comments: MOD I with ADLs, uses shower or will sponge bathe; cannot shower with leg wrappings; has a cleaning person 1x/wk; he manages his own meds     Extremity/Trunk Assessment   Upper Extremity Assessment Upper Extremity Assessment: Defer to OT evaluation    Lower Extremity Assessment Lower Extremity Assessment: Generalized weakness    Cervical / Trunk Assessment Cervical / Trunk Assessment: Kyphotic  Communication   Communication Communication: Impaired Factors Affecting Communication: Hearing impaired    Cognition Arousal: Alert Behavior During Therapy: WFL for tasks assessed/performed   PT - Cognitive impairments: No apparent impairments                         Following commands: Intact       Cueing Cueing Techniques: Verbal cues     General Comments      Exercises     Assessment/Plan    PT Assessment Patient needs continued PT services  PT Problem List Decreased strength;Decreased range of motion;Decreased activity tolerance;Decreased balance;Decreased mobility;Decreased knowledge of use of DME;Decreased safety awareness       PT Treatment Interventions DME instruction;Gait training;Functional mobility training;Therapeutic activities;Therapeutic exercise;Balance training;Patient/family education    PT Goals (Current goals can be found in the Care Plan section)  Acute Rehab PT Goals Patient Stated Goal: to return home and feel better PT Goal Formulation: With patient/family Time For Goal Achievement:  02/10/24 Potential to Achieve Goals: Good    Frequency Min 2X/week     Co-evaluation PT/OT/SLP Co-Evaluation/Treatment: Yes Reason for Co-Treatment: To address functional/ADL transfers PT goals addressed during session: Mobility/safety with mobility;Balance;Proper use of DME;Strengthening/ROM         AM-PAC PT 6 Clicks Mobility  Outcome Measure Help needed turning from your back to your side while in a flat bed without using bedrails?: A Little Help needed moving from lying on your back to sitting on the side of a flat bed without using bedrails?: A Little Help needed moving to and from a bed to a chair (including a wheelchair)?: A Little Help needed standing up from a chair using your arms (e.g., wheelchair or bedside chair)?: A Little Help needed to walk in hospital room?: A Lot Help needed climbing 3-5 steps with a railing? : Total 6 Click Score: 15    End of Session Equipment Utilized During Treatment: Gait belt Activity Tolerance: Patient tolerated treatment well Patient left: in chair;with call bell/phone within reach;with chair alarm set;with family/visitor present Nurse Communication: Mobility status PT Visit Diagnosis: Unsteadiness on feet (R26.81);Muscle weakness (generalized) (M62.81)    Time: 8883-8853 PT Time Calculation (min) (ACUTE ONLY): 30 min   Charges:  Mark Terry, SPT  01/27/2024, 12:44 PM

## 2024-01-27 NOTE — Progress Notes (Addendum)
 PHARMACY CONSULT NOTE - FOLLOW UP   Pharmacy Consult for Electrolyte Monitoring and Replacement   Recent Labs: Height: 5' 1 (154.9 cm) Weight: 98.8 kg (217 lb 13 oz) IBW/kg (Calculated) : 52.3 Estimated Creatinine Clearance: 17.6 mL/min (A) (by C-G formula based on SCr of 2.97 mg/dL (H)). Potassium (mmol/L)  Date Value  01/27/2024 5.2 (H)   Magnesium (mg/dL)  Date Value  92/78/7974 2.3   Calcium (mg/dL)  Date Value  92/78/7974 8.8 (L)   Albumin (g/dL)  Date Value  92/78/7974 2.9 (L)  09/25/2023 4.1   Phosphorus (mg/dL)  Date Value  92/78/7974 3.6   Sodium (mmol/L)  Date Value  01/27/2024 136  09/25/2023 142   Corrected Ca: 9.7 mg/dL  Assessment  Mark Terry. is a 89 y.o. male presenting with malaise and undue fatigue. Also presented in AKI for which nephrology is following. Scr improved today at 2.97 mg/dL (5.63 2/79). PMH significant for HTN, CAD, cHF, COPD, OSA on CPAP, diabetes, peripheral vascular disease, and CKD with anemia. Pharmacy has been consulted to monitor and replace electrolytes.  K+ elevated at 5.2 mEq/L, and increased from 4.9 on 7/20.   Goal of Therapy: Electrolytes WNL  Plan:  No electrolyte supplementation at time time Will defer to nephrology regarding management of hyperkalemia Check BMP, Mg, Phos with AM labs  Thank you for allowing pharmacy to be a part of this patient's care.  Massiel Stipp [place] - PharmD Candidate Clinical Pharmacist 01/27/2024 7:47 AM

## 2024-01-27 NOTE — Progress Notes (Signed)
 [facility]  - [place] at [facility]   PATIENT NAME: Mark Terry    MR#:  [id number]  DATE OF BIRTH:  [birth year]  SUBJECTIVE:  daughters at bedside. Patient transferred out of ICU came in with septic shock due to her right lower lobe pneumonia. Lives at home with home health and aid following. Has chronic hip joint pain. Patient eager to get Foley catheter removed very discomforting to him. Breathing better. Does not like the feeling of dryness and nose with oxygen. Set stable Wolverine to room air.    VITALS:  Blood pressure (!) 118/59, pulse 74, temperature 97.8 F (36.6 C), resp. rate 18, height 5' 1 (1.549 m), weight 98.8 kg, SpO2 95%.  PHYSICAL EXAMINATION:   GENERAL:  89 y.o.-year-old patient with no acute distress. Morbidly obese. Chronically ill appearing LUNGS: decreased breath sounds bilaterally, no wheezing CARDIOVASCULAR: S1, S2 normal. No murmur   ABDOMEN: Soft, nontender, nondistended. Bowel sounds present.  EXTREMITIES: bilateral unna boot NEUROLOGIC: nonfocal  patient is alert and awake   LABORATORY PANEL:  CBC Recent Labs  Lab 01/27/24 0500  WBC 4.7  HGB 9.7*  HCT 30.5*  PLT 102*    Chemistries  Recent Labs  Lab 01/25/24 1246 01/26/24 0421 01/27/24 0500  NA 134*   < > 136  K 4.9   < > 5.2*  CL 99   < > 106  CO2 21*   < > 22  GLUCOSE 87   < > 114*  BUN 80*   < > 54*  CREATININE 5.49*   < > 2.97*  CALCIUM 8.4*   < > 8.8*  MG  --    < > 2.3  AST 24  --   --   ALT 23  --   --   ALKPHOS 61  --   --   BILITOT 0.4  --   --    < > = values in this interval not displayed.   Cardiac Enzymes No results for input(s): TROPONINI in the last 168 hours. RADIOLOGY:  US  RENAL Result Date: 01/25/2024 CLINICAL DATA:  Acute kidney injury. EXAM: RENAL / URINARY TRACT ULTRASOUND COMPLETE COMPARISON:  Dec 06, 2023 FINDINGS: Right Kidney: Renal measurements: 9.3 cm x 6.0 cm x 5.1 cm = volume: 147 mL. Thinning of the renal cortex is noted.  Mildly increased echogenicity of the renal parenchyma is noted. No mass or hydronephrosis visualized. Left Kidney: Renal measurements: 11.2 cm x 5.8 cm x 5.0 cm = volume: 170.5 mL. Echogenicity within normal limits. A stable 2.6 cm x 2.8 cm x 2.0 cm renal cyst is seen within the lateral aspect of the mid left kidney. No hydronephrosis is visualized. Bladder: A Foley catheter is present within the urinary bladder. Other: None. IMPRESSION: 1. Mildly echogenic right kidney with mild thinning of the renal cortex which may represent sequelae associated with medical renal disease. 2. Stable left renal cyst. Electronically Signed   By: Suzen Dials M.D.   On: 01/25/2024 21:24   Assessment and Plan  89 yo M with hx of DM, Dyslipidemia, anemia,Bilateral rotator cuff repair/shoulder replacement surgery, HTN, Diastolic CHF, chronic bilateral lower extermity edema s/p vascular evaluation for vascular insufficinecy, chronic bronchitis with productive cough, COPD, OSA on CPAP, CKD coming in from home complaining of malaise and undue fatigue x 2 d. Reports loss of appetite found to have AKI and RLL infiltrate on workup.   Septic shock secondary to right lower lobe pneumonia acute hypoxic respiratory failure --  patient required vasopressor now off. Transferred out of ICU -- IV rocephin /zithromax  -- sepsis resolved -- blood cultures negative -- respiratory panel negative -- wean room air  Acute on chronic kidney disease stage IV/ATN in the setting of sepsis -- came in with creatinine of 4.36--- down to 2.9 --- baseline creatinine 2.0--2.3 -- nephrology consultation noted -- discontinue Foley catheter  COPD with chronic bronchitis -- not oxygen dependent -- incentive spirometer, inhalers, nabs as needed  Morbid obesity with obstructive sleep apnea -- patient not compliant to CPAP at home  Neuropathy bilateral, chronic DJD hip joint chronic pain -- patient at baseline is wheelchair-bound takes a  couple steps at home with [place] gets physical therapy according to Dter. -- PRN pain meds --continue Cymbalta  and gabapentin   Bilateral lower extremity edema with chronic venous stasis changes --cont Unna boot    PTO T to see patient. As per discussion with patient's daughters at bedside he would prefer patient returning back to home at [facility] living and will resume home health.  Discussed with TOC for discharge planning likley in am      Procedures: Family communication : daughters at bedside Consults : pulmonary, nephrology CODE STATUS: full DVT Prophylaxis : heparin  Level of care: Telemetry Cardiac Status is: Inpatient Remains inpatient appropriate because: awaiting PT OT and POC for discharge planning. Overall improving will monitor one more night    TOTAL TIME TAKING CARE OF THIS PATIENT: 35 minutes.  >50% time spent on counselling and coordination of care  Note: This dictation was prepared with Dragon dictation along with smaller phrase technology. Any transcriptional errors that result from this process are unintentional.  Leita Blanch M.D    [facility]   CC: Primary care physician; Gasper Nancyann BRAVO, MD

## 2024-01-27 NOTE — Progress Notes (Signed)
 [facility]  PROGRESS NOTE   Subjective:   Patient seen laying in bed Daughters at bedside Tolerating small meals Room air  Creatinine 2.97 Urine output 1550 mL in preceding 24 hours  Objective:  Vital signs: Blood pressure (!) 118/59, pulse 74, temperature 97.8 F (36.6 C), resp. rate 18, height 5' 1 (1.549 m), weight 98.8 kg, SpO2 95%.  Intake/Output Summary (Last 24 hours) at 01/27/2024 1501 Last data filed at 01/27/2024 1300 Gross per 24 hour  Intake 420 ml  Output 3750 ml  Net -3330 ml   Filed Weights   01/26/24 0430 01/27/24 0030 01/27/24 0426  Weight: 101.3 kg 98.8 kg 98.8 kg     Physical Exam: General:  No acute distress  Head:  Normocephalic, atraumatic. Moist oral mucosal membranes  Eyes:  Anicteric  Neck:  Supple  Lungs:   Clear to auscultation, normal effort  Heart:  S1S2 no rubs  Abdomen:   Soft, nontender, bowel sounds present  Extremities: 1+ peripheral edema.  Neurologic:  Awake, alert, following commands  Skin:  No lesions  Access: None    Basic Metabolic Panel: Recent Labs  Lab 01/25/24 1246 01/26/24 0421 01/27/24 0500  NA 134* 131* 136  K 4.9 4.9 5.2*  CL 99 102 106  CO2 21* 18* 22  GLUCOSE 87 110* 114*  BUN 80* 65* 54*  CREATININE 5.49* 4.36* 2.97*  CALCIUM 8.4* 8.2* 8.8*  MG  --  2.1 2.3  PHOS  --  5.0* 3.6   GFR: Estimated Creatinine Clearance: 17.6 mL/min (A) (by C-G formula based on SCr of 2.97 mg/dL (H)).  Liver Function Tests: Recent Labs  Lab 01/25/24 1246 01/26/24 0421 01/27/24 0500  AST 24  --   --   ALT 23  --   --   ALKPHOS 61  --   --   BILITOT 0.4  --   --   PROT 7.2  --   --   ALBUMIN 3.6 2.8* 2.9*   No results for input(s): LIPASE, AMYLASE in the last 168 hours. No results for input(s): AMMONIA in the last 168 hours.  CBC: Recent Labs  Lab 01/25/24 1246 01/27/24 0500  WBC 6.2 4.7  NEUTROABS 4.0 2.8  HGB 10.1* 9.7*  HCT 31.7* 30.5*  MCV 98.8 99.7  PLT 107* 102*      HbA1C: Hemoglobin A1C  Date/Time Value Ref Range Status  01/15/2024 11:29 AM 7.5 (A) 4.0 - 5.6 % Final   Hgb A1c MFr Bld  Date/Time Value Ref Range Status  09/25/2023 08:32 AM 7.9 (H) 4.8 - 5.6 % Final    Comment:             Prediabetes: 5.7 - 6.4          Diabetes: >6.4          Glycemic control for adults with diabetes: <7.0   06/28/2023 11:34 AM 7.8 (H) 4.8 - 5.6 % Final    Comment:             Prediabetes: 5.7 - 6.4          Diabetes: >6.4          Glycemic control for adults with diabetes: <7.0     Urinalysis: Recent Labs    01/25/24 1533  COLORURINE YELLOW*  LABSPEC 1.017  PHURINE 5.0  GLUCOSEU 150*  HGBUR NEGATIVE  BILIRUBINUR NEGATIVE  KETONESUR NEGATIVE  PROTEINUR NEGATIVE  NITRITE NEGATIVE  LEUKOCYTESUR NEGATIVE      Imaging: US   RENAL Result Date: 01/25/2024 CLINICAL DATA:  Acute kidney injury. EXAM: RENAL / URINARY TRACT ULTRASOUND COMPLETE COMPARISON:  Dec 06, 2023 FINDINGS: Right Kidney: Renal measurements: 9.3 cm x 6.0 cm x 5.1 cm = volume: 147 mL. Thinning of the renal cortex is noted. Mildly increased echogenicity of the renal parenchyma is noted. No mass or hydronephrosis visualized. Left Kidney: Renal measurements: 11.2 cm x 5.8 cm x 5.0 cm = volume: 170.5 mL. Echogenicity within normal limits. A stable 2.6 cm x 2.8 cm x 2.0 cm renal cyst is seen within the lateral aspect of the mid left kidney. No hydronephrosis is visualized. Bladder: A Foley catheter is present within the urinary bladder. Other: None. IMPRESSION: 1. Mildly echogenic right kidney with mild thinning of the renal cortex which may represent sequelae associated with medical renal disease. 2. Stable left renal cyst. Electronically Signed   By: Suzen Dials M.D.   On: 01/25/2024 21:24     Medications:      amoxicillin -clavulanate  1 tablet Oral Q12H   aspirin  EC  81 mg Oral Daily   Chlorhexidine  Gluconate Cloth  6 each Topical Nightly   DULoxetine   30 mg Oral Daily    fluticasone  furoate-vilanterol  1 puff Inhalation Daily   gabapentin   300 mg Oral TID   Gerhardt's butt cream   Topical TID   heparin   5,000 Units Subcutaneous Q8H   levothyroxine   50 mcg Oral Daily   loratadine   10 mg Oral Daily   metoprolol  tartrate  25 mg Oral BID   midodrine   2.5 mg Oral TID WC   multivitamin with minerals  1 tablet Oral Daily   oxybutynin   5 mg Oral BID   pantoprazole   40 mg Oral Daily   simvastatin   40 mg Oral QHS   Vitamin D  (Ergocalciferol )  50,000 Units Oral Q7 days    Assessment/ Plan:     89 y.o. male with a PMHx of hypertension, coronary artery disease, congestive heart failure, COPD, obstructive sleep apnea on CPAP, diabetes, peripheral vascular disease, chronic kidney disease with anemia admitted with malaise and undue fatigue. He had chest x-ray showing pneumonia.  He was started on Rocephin  and Zithromax .  He also has acute kidney injury on chronic kidney disease. He was given IV fluids with normal saline x 2 L.   #1: Acute kidney injury on chronic kidney disease: Patient is at baseline creatinine 2.4 with EGFR of 27 cc/min. The acute kidney injury might be secondary to ATN secondary to sepsis and hypoperfusion.    Creatinine has shown some improvement since admission.  Creatinine 2.97.  Adequate urine output recorded.  Continue supportive measures.  No acute indication for dialysis.  Continue to avoid nephrotoxic agents, therapies, and hypotension.   #2: Sepsis/UTI/pneumonia: Continue Zithromax  and Augmentin .   #3: Hypotension with chronic kidney disease.  Currently prescribed metoprolol  with midodrine .  #4: Chronic urinary tract infection: Received two bactrim  prescriptions consecutively.      LOS: 2 [facility] 7/21/20253:01 PM

## 2024-01-27 NOTE — Progress Notes (Signed)
 PHARMACY NOTE:  ANTIMICROBIAL RENAL DOSAGE ADJUSTMENT  Current antimicrobial regimen includes a mismatch between antimicrobial dosage and estimated renal function.  As per policy approved by the Pharmacy & Therapeutics and Medical Executive Committees, the antimicrobial dosage will be adjusted accordingly.  Current antimicrobial dosage: Augmentin  875 mg BID  Renal Function: Estimated Creatinine Clearance: 17.6 mL/min (A) (by C-G formula based on SCr of 2.97 mg/dL (H)).  Antimicrobial dosage has been changed to: Augmentin  500 mg BID  Thank you for allowing pharmacy to be a part of this patient's care.  Evonnie Nieves, [facility] 01/27/2024 2:58 PM

## 2024-01-27 NOTE — Progress Notes (Signed)
 PULMONOLOGY PROGRESS NOTE    Name: Mark Terry. MRN: [id number] DOB: February 15, 1935     LOS: 2   SUBJECTIVE FINDINGS & SIGNIFICANT EVENTS    History of Presenting Illness:  89 yo M with hx of DM, Dyslipidemia, anemia,Bilateral rotator cuff repair/shoulder replacement surgery,  HTN, Diastolic CHF, chronic bilateral lower extermity edema s/p  vascular evaluation for vascular insufficinecy, chronic bronchitis with productive cough, COPD, OSA on CPAP, CKD coming in from home complaining of malaise and undue fatigue x 2 d.  Reports loss of appetite found to have AKI and RLL infiltrate on workup.  Was found to be in shock and despite IV Fluid treatment continued to be profoundly hypotensive. He does have elevated lactate.  His oxygen was noted to be low and he required supplemental O2 due to desaturation.  He is hypothermic during my evaluation.  Vasopressor support was initiated due to shock physiology after 2L bolus of IVF.   01/26/24- patient clinically improved and is up awake interactive stable off off levophed .  He had 2 daughters at bedside, we reviewed medical plan and answered questions.   01/27/24- Patient appears stable NAD,  bloodwork without leukocytosis,  on room air.  Have transitioned off zithromax  IV/Rocephin  IV to augmentin  PO x 4 days more.  Midodrine  reduced to 2.5 TID.  Wound care RN on case for open skin sores.  He is having repeat CXR for RLL pneumonia today.  He is cleared for dc home from pulmonary perspective.   Lines/tubes : Epidural Catheter 06/09/18 (Active)     Epidural Catheter 07/23/18 (Active)    Microbiology/Sepsis markers: Results for orders placed or performed during the hospital encounter of 01/25/24  Culture, blood (Routine x 2)     Status: None (Preliminary result)   Collection  Time: 01/25/24 12:46 PM   Specimen: BLOOD  Result Value Ref Range Status   Specimen Description BLOOD LEFT ANTECUBITAL  Final   Special Requests   Final    BOTTLES DRAWN AEROBIC AND ANAEROBIC Blood Culture adequate volume   Culture   Final    NO GROWTH 2 DAYS Performed at [facility], [address], [place], [place] [address]    Report Status PENDING  Incomplete  Culture, blood (Routine x 2)     Status: None (Preliminary result)   Collection Time: 01/25/24  1:17 PM   Specimen: BLOOD  Result Value Ref Range Status   Specimen Description BLOOD BLOOD RIGHT FOREARM  Final   Special Requests   Final    BOTTLES DRAWN AEROBIC AND ANAEROBIC Blood Culture results may not be optimal due to an inadequate volume of blood received in culture bottles   Culture   Final    NO GROWTH 2 DAYS Performed at [facility], [address] [place], [place] [address]    Report Status PENDING  Incomplete  Respiratory (~20 pathogens) panel by PCR     Status: None   Collection Time: 01/25/24  3:34 PM  Result Value Ref Range Status   Adenovirus NOT DETECTED NOT DETECTED Final   Coronavirus 229E NOT DETECTED NOT DETECTED Final    Comment: (NOTE) The Coronavirus on the Respiratory Panel, DOES NOT test for the novel  Coronavirus (2019 nCoV)    Coronavirus HKU1 NOT DETECTED NOT DETECTED Final   Coronavirus NL63 NOT DETECTED NOT DETECTED Final   Coronavirus OC43 NOT DETECTED NOT DETECTED Final   Metapneumovirus NOT DETECTED NOT DETECTED Final   Rhinovirus / Enterovirus NOT DETECTED NOT  DETECTED Final   Influenza A NOT DETECTED NOT DETECTED Final   Influenza B NOT DETECTED NOT DETECTED Final   Parainfluenza Virus 1 NOT DETECTED NOT DETECTED Final   Parainfluenza Virus 2 NOT DETECTED NOT DETECTED Final   Parainfluenza Virus 3 NOT DETECTED NOT DETECTED Final   Parainfluenza Virus 4 NOT DETECTED NOT DETECTED Final   Respiratory Syncytial Virus NOT DETECTED NOT DETECTED Final   Bordetella  pertussis NOT DETECTED NOT DETECTED Final   Bordetella Parapertussis NOT DETECTED NOT DETECTED Final   Chlamydophila pneumoniae NOT DETECTED NOT DETECTED Final   Mycoplasma pneumoniae NOT DETECTED NOT DETECTED Final    Comment: Performed at [facility], [address] [place], [place] [address]  SARS Coronavirus 2 by RT PCR (hospital order, performed in [facility] hospital lab) *cepheid single result test*     Status: None   Collection Time: 01/25/24  3:34 PM  Result Value Ref Range Status   SARS Coronavirus 2 by RT PCR NEGATIVE NEGATIVE Final    Comment: (NOTE) SARS-CoV-2 target nucleic acids are NOT DETECTED.  The SARS-CoV-2 RNA is generally detectable in upper and lower respiratory specimens during the acute phase of infection. The lowest concentration of SARS-CoV-2 viral copies this assay can detect is 250 copies / mL. A negative result does not preclude SARS-CoV-2 infection and should not be used as the sole basis for treatment or other patient management decisions.  A negative result may occur with improper specimen collection / handling, submission of specimen other than nasopharyngeal swab, presence of viral mutation(s) within the areas targeted by this assay, and inadequate number of viral copies (<250 copies / mL). A negative result must be combined with clinical observations, patient history, and epidemiological information.  Fact Sheet for Patients:   RoadLapTop.co.za  Fact Sheet for Healthcare Providers: http://kim-miller.com/  This test is not yet approved or  cleared by the [place]  FDA and has been authorized for detection and/or diagnosis of SARS-CoV-2 by FDA under an Emergency Use Authorization (EUA).  This EUA will remain in effect (meaning this test can be used) for the duration of the COVID-19 declaration under Section 564(b)(1) of the Act, 21 U.S.C. section 360bbb-3(b)(1), unless the authorization is  terminated or revoked sooner.  Performed at [facility], [address] [place], [place] [address]   MRSA Next Gen by PCR, Nasal     Status: None   Collection Time: 01/25/24  3:46 PM   Specimen: Nasal Mucosa; Nasal Swab  Result Value Ref Range Status   MRSA by PCR Next Gen NOT DETECTED NOT DETECTED Final    Comment: (NOTE) The GeneXpert MRSA Assay (FDA approved for NASAL specimens only), is one component of a comprehensive MRSA colonization surveillance program. It is not intended to diagnose MRSA infection nor to guide or monitor treatment for MRSA infections. Test performance is not FDA approved in patients less than 10 years old. Performed at [facility], [address] [place], [place] [address]   MRSA Next Gen by PCR, Nasal     Status: None   Collection Time: 01/25/24  4:42 PM   Specimen: Nasal Mucosa; Nasal Swab  Result Value Ref Range Status   MRSA by PCR Next Gen NOT DETECTED NOT DETECTED Final    Comment: (NOTE) The GeneXpert MRSA Assay (FDA approved for NASAL specimens only), is one component of a comprehensive MRSA colonization surveillance program. It is not intended to diagnose MRSA infection nor to guide or monitor treatment for  MRSA infections. Test performance is not FDA approved in patients less than 41 years old. Performed at [facility], [address] [place], [place] [address]     Anti-infectives:  Anti-infectives (From admission, onward)    Start     Dose/Rate Route Frequency Ordered Stop   01/26/24 1000  cefTRIAXone  (ROCEPHIN ) 2 g in sodium chloride  0.9 % 100 mL IVPB        2 g 200 mL/hr over 30 Minutes Intravenous Every 24 hours 01/26/24 0744 01/30/24 0959   01/26/24 1000  azithromycin  (ZITHROMAX ) 500 mg in sodium chloride  0.9 % 250 mL IVPB        500 mg 250 mL/hr over 60 Minutes Intravenous Every 24 hours 01/26/24 0744 01/28/24 0959   01/25/24 1345  cefTRIAXone  (ROCEPHIN ) 1 g in sodium chloride  0.9 % 100 mL  IVPB        1 g 200 mL/hr over 30 Minutes Intravenous  Once 01/25/24 1341 01/25/24 1431   01/25/24 1345  azithromycin  (ZITHROMAX ) 500 mg in sodium chloride  0.9 % 250 mL IVPB        500 mg 250 mL/hr over 60 Minutes Intravenous  Once 01/25/24 1341 01/25/24 1549         PAST MEDICAL HISTORY   Past Medical History:  Diagnosis Date   Anemia    Bruises easily    Diabetes mellitus without complication (HCC)    H/O esophagogastroduodenoscopy    H/O hiatal hernia    History of blood transfusion    no reaction noted   History of bronchitis    last time a couple of yrs ago   History of gastric ulcer    History of MRSA infection    Hyperlipidemia    Hypertension    Leg cramps    takes quinine  prn   Melanoma (HCC)    Panic attacks    Pneumonia    hx of last time a couple of yrs ago   PONV (postoperative nausea and vomiting)    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 laminectomy Dr. Louis 2008 cervical fusion x 2   bilateral cataract surgery     CHOLECYSTECTOMY  1970   COLONOSCOPY     CORONARY ANGIOPLASTY     CORONARY ARTERY BYPASS GRAFT  2000    3 vessels   ESOPHAGOGASTRODUODENOSCOPY (EGD) WITH PROPOFOL  N/A 11/18/2017   Procedure: ESOPHAGOGASTRODUODENOSCOPY (EGD) WITH PROPOFOL ;  Surgeon: Therisa Bi, MD;  Location: [facility] ENDOSCOPY;  Service: Gastroenterology;  Laterality: N/A;   GANGLION CYST EXCISION Right 10/22/2022   Dr. Kathlynn   JOINT REPLACEMENT     MOHS SURGERY Left 12/30/2018   [facility], Dorn Ahle, MD   NECK SURGERY     x 2    prostate laser surgery     x 2   REPLACEMENT TOTAL KNEE BILATERAL  '86 and 2000   REVERSE SHOULDER ARTHROPLASTY  05/30/2012   Procedure: REVERSE SHOULDER ARTHROPLASTY;  Surgeon: Elspeth JONELLE Her, MD;  Location: [facility] OR;  Service: Orthopedics;  Laterality: Right;  right reverse  shoulder arthroplasty   SQUAMOUS CELL CARCINOMA EXCISION  05/01/2019   mid superior forehead/ [facility]/ Dr. Dorn L. Cook   TOTAL HIP ARTHROPLASTY Right 2012   [facility]     FAMILY HISTORY   Family History  Problem Relation Age of Onset   Alzheimer's disease Mother    Heart attack Father    Bipolar disorder Brother    Kidney disease Neg Hx    Prostate cancer Neg Hx      SOCIAL HISTORY   Social History   Tobacco Use   Smoking status: Former    Current packs/day: 0.00    Average packs/day: 1 pack/day for 25.0 years (25.0 ttl pk-yrs)    Types: Cigarettes    Start date: 07/10/1943    Quit date: 07/09/1968    Years since quitting: 55.5    Passive exposure: Past   Smokeless tobacco: Never   Tobacco comments:    quit at age 61  Vaping Use   Vaping status: Never Used  Substance Use Topics   Alcohol use: No    Alcohol/week: 0.0 standard drinks of alcohol   Drug use: No     MEDICATIONS   Current Medication:  Current Facility-Administered Medications:    azithromycin  (ZITHROMAX ) 500 mg in sodium chloride  0.9 % 250 mL IVPB, 500 mg, Intravenous, Q24H, Kjuan Seipp, MD, Stopped at 01/26/24 1400   cefTRIAXone  (ROCEPHIN ) 2 g in sodium chloride  0.9 % 100 mL IVPB, 2 g, Intravenous, Q24H, Kellie Murrill, MD, Paused at 01/26/24 1251   Chlorhexidine  Gluconate Cloth 2 % PADS 6 each, 6 each, Topical, Nightly, Vasiliki Smaldone, MD, 6 each at 01/27/24 0030   docusate sodium  (COLACE) capsule 100 mg, 100 mg, Oral, BID PRN, Mckinley Adelstein, MD, 100 mg at 01/26/24 1027   DULoxetine  (CYMBALTA ) DR capsule 30 mg, 30 mg, Oral, Daily, Ouma, Almarie Bake, NP, 30 mg at 01/26/24 1012   fluticasone   furoate-vilanterol (BREO ELLIPTA ) 100-25 MCG/ACT 1 puff, 1 puff, Inhalation, Daily, Siham Bucaro, MD, 1 puff at 01/26/24 0844   gabapentin  (NEURONTIN ) capsule 300 mg, 300 mg, Oral, TID, Ouma, Elizabeth Achieng, NP, 300 mg at 01/26/24 2131   Gerhardt's butt cream, , Topical, TID, Edwin Cherian, MD, 1 Application at 01/26/24 2132   heparin  injection 5,000 Units, 5,000 Units, Subcutaneous, Q8H, Hitesh Fouche, MD, 5,000 Units at 01/27/24 0542   HYDROcodone -acetaminophen  (NORCO/VICODIN) 5-325 MG per tablet 1-2 tablet, 1-2 tablet, Oral, BID PRN, Ouma, Elizabeth Achieng, NP, 1 tablet at 01/26/24 2025   ipratropium-albuterol  (DUONEB) 0.5-2.5 (3) MG/3ML nebulizer solution 3 mL, 3 mL, Nebulization, Q6H PRN, Ryer Asato, MD   levothyroxine  (SYNTHROID ) tablet 50 mcg, 50 mcg, Oral, Daily, Ouma, Almarie Bake, NP, 50 mcg at 01/27/24 9457   midodrine  (PROAMATINE ) tablet 10 mg, 10 mg, Oral, TID WC, Legacy Carrender, MD, 10 mg at 01/26/24 1603   Oral care mouth rinse, 15 mL, Mouth Rinse, PRN, Ouma, Almarie Bake, NP   polyethylene glycol (MIRALAX  / GLYCOLAX ) packet 17 g, 17 g, Oral, Daily PRN, Juma Oxley, MD, 17 g at 01/27/24 9366   simvastatin  (ZOCOR ) tablet 40 mg, 40 mg, Oral, QHS, Ouma, Almarie Bake, NP, 40 mg at 01/26/24 2131    ALLERGIES   Meperidine, Propoxyphene, Codeine sulfate, Darvon [propoxyphene hcl], and Oxycodone     REVIEW OF SYSTEMS    10 point ROS conducted and is negative except for fatigue, cough, anorexia  PHYSICAL EXAMINATION   Vital Signs: Temp:  [97.3 F (36.3 C)-98.7 F (37.1 C)] 97.9 F (36.6 C) (07/21 0810) Pulse  Rate:  [64-86] 74 (07/21 0810) Resp:  [12-23] 18 (07/21 0349) BP: (92-135)/(41-86) 119/56 (07/21 0810) SpO2:  [92 %-99 %] 95 % (07/21 0810) Weight:  [98.8 kg] 98.8 kg (07/21 0426)  GENERAL:mild distress due to acutely ill status, Age appropriate HEAD: Normocephalic, atraumatic.  EYES: Pupils equal, round, reactive to light.  No  scleral icterus.  MOUTH: Moist mucosal membrane. NECK: Supple. No thyromegaly. No nodules. No JVD.  PULMONARY: rhonchi on right side with decreased air entry CARDIOVASCULAR: S1 and S2. Regular rate and rhythm. No murmurs, rubs, or gallops.  GASTROINTESTINAL: Soft, nontender, non-distended. No masses. Positive bowel sounds. No hepatosplenomegaly.  MUSCULOSKELETAL: No swelling, clubbing, or edema.  NEUROLOGIC: Mild distress due to acute illness SKIN:intact,warm,dry   PERTINENT DATA     Infusions:  azithromycin  (ZITHROMAX ) 500 mg in sodium chloride  0.9 % 250 mL IVPB Stopped (01/26/24 1400)   cefTRIAXone  (ROCEPHIN )  IV Stopped (01/26/24 1251)   Scheduled Medications:  Chlorhexidine  Gluconate Cloth  6 each Topical Nightly   DULoxetine   30 mg Oral Daily   fluticasone  furoate-vilanterol  1 puff Inhalation Daily   gabapentin   300 mg Oral TID   Gerhardt's butt cream   Topical TID   heparin   5,000 Units Subcutaneous Q8H   levothyroxine   50 mcg Oral Daily   midodrine   10 mg Oral TID WC   simvastatin   40 mg Oral QHS   PRN Medications: docusate sodium , HYDROcodone -acetaminophen , ipratropium-albuterol , mouth rinse, polyethylene glycol Hemodynamic parameters:   Intake/Output: 07/20 0701 - 07/21 0700 In: 830 [P.O.:480; IV Piggyback:350] Out: 1550 [Urine:1550]  Ventilator  Settings:     LAB RESULTS:  Basic Metabolic Panel: Recent Labs  Lab 01/25/24 1246 01/26/24 0421 01/27/24 0500  NA 134* 131* 136  K 4.9 4.9 5.2*  CL 99 102 106  CO2 21* 18* 22  GLUCOSE 87 110* 114*  BUN 80* 65* 54*  CREATININE 5.49* 4.36* 2.97*  CALCIUM 8.4* 8.2* 8.8*  MG  --  2.1 2.3  PHOS  --  5.0* 3.6   Liver Function Tests: Recent Labs  Lab 01/25/24 1246 01/26/24 0421 01/27/24 0500  AST 24  --   --   ALT 23  --   --   ALKPHOS 61  --   --   BILITOT 0.4  --   --   PROT 7.2  --   --   ALBUMIN 3.6 2.8* 2.9*   No results for input(s): LIPASE, AMYLASE in the last 168 hours. No results for  input(s): AMMONIA in the last 168 hours. CBC: Recent Labs  Lab 01/25/24 1246 01/27/24 0500  WBC 6.2 4.7  NEUTROABS 4.0 2.8  HGB 10.1* 9.7*  HCT 31.7* 30.5*  MCV 98.8 99.7  PLT 107* 102*   Cardiac Enzymes: No results for input(s): CKTOTAL, CKMB, CKMBINDEX, TROPONINI in the last 168 hours. BNP: Invalid input(s): POCBNP CBG: Recent Labs  Lab 01/25/24 1639  GLUCAP 93       IMAGING RESULTS:     ASSESSMENT AND PLAN    -Multidisciplinary rounds held today  Right lower lobe pneumonia -suspect due to Pneumonia with CXR showing infiltrate on Right lower lung zone -background of COPD with chronic bronchitic phenotype -MRSA PCR -Procalcitoinin trend -CRP daily  -empiric CAP coverage Rocephin  Zithromax  -IS and flutter for chest physiotherapy -VBG -RVP -COVID/RSV/Influenza testing   2. Septic shock - present on admission -resolved     - due to either UTI or pneumonia     -history of urine culture with >100,000 colonies  of   Bacteria Klebsiella aerogenes Abnormal   -perform blood cultures -urine cultures  -urinalysis -continue Rocephin  zithromax  for now -lactate trend -IVF - judicious use due to CHF   3. Renal Failure-Acute on chronic -KDIGO 4- IMPROVED -nephrology consult -US  renal  -dc nonessential nephrotoxic meds -follow chem 7 -follow UO -continue Foley Catheter-assess need daily   4. COPD with chronic bronchitic phenotype    - continue Duoneb q6h prn    - IS and flutter device    - Breo ellipta    5. Obstructive sleep apnea     - CPAP at bedtime - RT for settings   6. Neuropathy   Continue gabapentin  and cymbalta    7. ID -continue IV abx as prescibed -follow up cultures  8. GI/Nutrition GI PROPHYLAXIS as indicated- on prilosec at home for GERD  DIET-->TF's as tolerated Constipation protocol as indicated  9. ENDO - ICU hypoglycemic\Hyperglycemia protocol -check FSBS per protocol   10. ELECTROLYTES -follow labs as  needed -replace as needed -pharmacy consultation   11. DVT/GI PRX ordered -SCDs  TRANSFUSIONS AS NEEDED MONITOR FSBS ASSESS the need for LABS as needed    Critical care provider statement:   Total critical care time: 32 minutes   Performed by: Parris MD   Critical care time was exclusive of separately billable procedures and treating other patients.   Critical care was necessary to treat or prevent imminent or life-threatening deterioration.   Critical care was time spent personally by me on the following activities: development of treatment plan with patient and/or surrogate as well as nursing, discussions with consultants, evaluation of patient's response to treatment, examination of patient, obtaining history from patient or surrogate, ordering and performing treatments and interventions, ordering and review of laboratory studies, ordering and review of radiographic studies, pulse oximetry and re-evaluation of patient's condition.    Abrina Petz, M.D.  Pulmonary & Critical Care Medicine

## 2024-01-28 ENCOUNTER — Other Ambulatory Visit: Payer: Self-pay | Admitting: Family Medicine

## 2024-01-28 DIAGNOSIS — L8990 Pressure ulcer of unspecified site, unspecified stage: Secondary | ICD-10-CM

## 2024-01-28 DIAGNOSIS — N179 Acute kidney failure, unspecified: Secondary | ICD-10-CM | POA: Diagnosis not present

## 2024-01-28 DIAGNOSIS — R579 Shock, unspecified: Secondary | ICD-10-CM | POA: Diagnosis not present

## 2024-01-28 DIAGNOSIS — J189 Pneumonia, unspecified organism: Secondary | ICD-10-CM | POA: Diagnosis not present

## 2024-01-28 LAB — BASIC METABOLIC PANEL WITH GFR
Anion gap: 9 (ref 5–15)
BUN: 45 mg/dL — ABNORMAL HIGH (ref 8–23)
CO2: 23 mmol/L (ref 22–32)
Calcium: 9.3 mg/dL (ref 8.9–10.3)
Chloride: 103 mmol/L (ref 98–111)
Creatinine, Ser: 2.28 mg/dL — ABNORMAL HIGH (ref 0.61–1.24)
GFR, Estimated: 27 mL/min — ABNORMAL LOW (ref >60)
Glucose, Bld: 201 mg/dL — ABNORMAL HIGH (ref 70–99)
Potassium: 5.3 mmol/L — ABNORMAL HIGH (ref 3.5–5.1)
Sodium: 135 mmol/L (ref 135–145)

## 2024-01-28 LAB — LEGIONELLA PNEUMOPHILA SEROGP 1 UR AG: L. pneumophila Serogp 1 Ur Ag: NEGATIVE

## 2024-01-28 MED ORDER — AMOXICILLIN-POT CLAVULANATE 500-125 MG PO TABS: 1.0000 | ORAL_TABLET | Freq: Two times a day (BID) | ORAL | 0 refills | Status: AC

## 2024-01-28 MED ORDER — ENSURE PLUS HIGH PROTEIN PO LIQD: 237.0000 mL | Freq: Two times a day (BID) | ORAL | 0 refills | Status: AC

## 2024-01-28 MED ORDER — ASCORBIC ACID 500 MG PO TABS: 500.0000 mg | ORAL_TABLET | Freq: Two times a day (BID) | ORAL | 0 refills | Status: AC

## 2024-01-28 NOTE — Progress Notes (Signed)
 [facility]  PROGRESS NOTE   Subjective:   Patient seen sitting up in bed Alert and oriented Appears well today Tolerated small amount of breakfast Room air  Creatinine 2.28 Urine output 2200 mL in preceding 24 hours  Objective:  Vital signs: Blood pressure 116/62, pulse 70, temperature (!) 97.5 F (36.4 C), temperature source Oral, resp. rate 18, height 5' 1 (1.549 m), weight 100.3 kg, SpO2 93%.  Intake/Output Summary (Last 24 hours) at 01/28/2024 1300 Last data filed at 01/28/2024 1103 Gross per 24 hour  Intake 480 ml  Output 450 ml  Net 30 ml   Filed Weights   01/27/24 0030 01/27/24 0426 01/28/24 0454  Weight: 98.8 kg 98.8 kg 100.3 kg     Physical Exam: General:  No acute distress  Head:  Normocephalic, atraumatic. Moist oral mucosal membranes  Eyes:  Anicteric  Neck:  Supple  Lungs:   Clear to auscultation, normal effort  Heart:  S1S2 no rubs  Abdomen:   Soft, nontender, bowel sounds present  Extremities: 1+ peripheral edema.  Neurologic:  Awake, alert, following commands  Skin:  No lesions  Access: None    Basic Metabolic Panel: Recent Labs  Lab 01/25/24 1246 01/26/24 0421 01/27/24 0500 01/28/24 1058  NA 134* 131* 136 135  K 4.9 4.9 5.2* 5.3*  CL 99 102 106 103  CO2 21* 18* 22 23  GLUCOSE 87 110* 114* 201*  BUN 80* 65* 54* 45*  CREATININE 5.49* 4.36* 2.97* 2.28*  CALCIUM 8.4* 8.2* 8.8* 9.3  MG  --  2.1 2.3  --   PHOS  --  5.0* 3.6  --    GFR: Estimated Creatinine Clearance: 23.1 mL/min (A) (by C-G formula based on SCr of 2.28 mg/dL (H)).  Liver Function Tests: Recent Labs  Lab 01/25/24 1246 01/26/24 0421 01/27/24 0500  AST 24  --   --   ALT 23  --   --   ALKPHOS 61  --   --   BILITOT 0.4  --   --   PROT 7.2  --   --   ALBUMIN 3.6 2.8* 2.9*   No results for input(s): LIPASE, AMYLASE in the last 168 hours. No results for input(s): AMMONIA in the last 168 hours.  CBC: Recent Labs  Lab 01/25/24 1246  01/27/24 0500  WBC 6.2 4.7  NEUTROABS 4.0 2.8  HGB 10.1* 9.7*  HCT 31.7* 30.5*  MCV 98.8 99.7  PLT 107* 102*     HbA1C: Hemoglobin A1C  Date/Time Value Ref Range Status  01/15/2024 11:29 AM 7.5 (A) 4.0 - 5.6 % Final   Hgb A1c MFr Bld  Date/Time Value Ref Range Status  09/25/2023 08:32 AM 7.9 (H) 4.8 - 5.6 % Final    Comment:             Prediabetes: 5.7 - 6.4          Diabetes: >6.4          Glycemic control for adults with diabetes: <7.0   06/28/2023 11:34 AM 7.8 (H) 4.8 - 5.6 % Final    Comment:             Prediabetes: 5.7 - 6.4          Diabetes: >6.4          Glycemic control for adults with diabetes: <7.0     Urinalysis: Recent Labs    01/25/24 1533  COLORURINE YELLOW*  LABSPEC 1.017  PHURINE 5.0  GLUCOSEU 150*  HGBUR NEGATIVE  BILIRUBINUR NEGATIVE  KETONESUR NEGATIVE  PROTEINUR NEGATIVE  NITRITE NEGATIVE  LEUKOCYTESUR NEGATIVE      Imaging: DG Chest Port 1 View Result Date: 01/27/2024 CLINICAL DATA:  [id number] Right lower lobe pneumonia [id number] EXAM: PORTABLE CHEST 1 VIEW COMPARISON:  Chest x-ray 01/25/2024 FINDINGS: Patient is rotated. The heart and mediastinal contours are within normal limits. Low lung volumes. Improved aeration of the right lower lung zone. No focal consolidation. No pulmonary edema. No pleural effusion. No pneumothorax. No acute osseous abnormality. Total reverse bilateral shoulder arthroplasties partially visualized. Chronically fractured sternotomy wires. IMPRESSION: Limitation due to low lung volumes with patient rotation. Improved aeration of the right lower lung zone. No acute cardiopulmonary abnormality. Electronically Signed   By: Morgane  Naveau M.D.   On: 01/27/2024 17:09     Medications:      amoxicillin -clavulanate  1 tablet Oral Q12H   vitamin C   500 mg Oral BID   aspirin  EC  81 mg Oral Daily   Chlorhexidine  Gluconate Cloth  6 each Topical Nightly   DULoxetine   30 mg Oral Daily   feeding supplement  237 mL Oral BID  BM   fluticasone  furoate-vilanterol  1 puff Inhalation Daily   gabapentin   300 mg Oral TID   Gerhardt's butt cream   Topical TID   heparin   5,000 Units Subcutaneous Q8H   levothyroxine   50 mcg Oral Daily   loratadine   10 mg Oral Daily   metoprolol  tartrate  25 mg Oral BID   midodrine   2.5 mg Oral TID WC   multivitamin with minerals  1 tablet Oral Daily   oxybutynin   5 mg Oral BID   pantoprazole   40 mg Oral Daily   simvastatin   40 mg Oral QHS   Vitamin D  (Ergocalciferol )  50,000 Units Oral Q7 days    Assessment/ Plan:     89 y.o. male with a PMHx of hypertension, coronary artery disease, congestive heart failure, COPD, obstructive sleep apnea on CPAP, diabetes, peripheral vascular disease, chronic kidney disease with anemia admitted with malaise and undue fatigue. He had chest x-ray showing pneumonia.  He was started on Rocephin  and Zithromax .  He also has acute kidney injury on chronic kidney disease. He was given IV fluids with normal saline x 2 L.   #1: Acute kidney injury on chronic kidney disease: Patient is at baseline creatinine 2.4 with EGFR of 27 cc/min. The acute kidney injury might be secondary to ATN secondary to sepsis and hypoperfusion.    Creatinine continues to improve, 2.28 today.  Baseline.  Patient produced more than 2 L urine output yesterday.  Will continue to monitor during this admission.   #2: Sepsis/UTI/pneumonia: Continue Augmentin .  Completed Zithromax    #3: Hypotension with chronic kidney disease.  Currently prescribed metoprolol  with midodrine .  Blood pressure improved, 116/62 today.  #4: Chronic urinary tract infection: Received two bactrim  prescriptions consecutively.      LOS: 3 [facility] 7/22/20251:00 PM

## 2024-01-28 NOTE — Progress Notes (Signed)
 Pt family not aware at this time when home health will visit pt.  2 days worth of wound care supplies provided.

## 2024-01-28 NOTE — TOC Initial Note (Signed)
 Transition of Care (TOC) - Initial/Assessment Note    Patient Details  Name: Mark Terry. MRN: [id number] Date of Birth: [birth year]  Transition of Care [facility]) CM/SW Contact:    Asberry CHRISTELLA Jaksch, RN Phone Number: 01/28/2024, 11:36 AM  Clinical Narrative:                 Admitted for: Acute Hypoxic Respiratory Failure  Admitted from: [facility] PCP: Gasper [facility]  Current home health/prior home health/DME: Active with [facility] for RN, PT, OT. Power WC, shower seat, BSC, grab bars, lift chair.   Therapy recommends to continue home health services and a hospital bed. Patient in agreement. Referral made to Mitch from [facility] for a hospital bed to be delivered to the home. Channing with [facility] notified of discharge. Daughter Olam updated per patient request. Olam to provide discharge transport.   Expected Discharge Plan: Home w Home Health Services Barriers to Discharge: Barriers Resolved   Patient Goals and CMS Choice Patient states their goals for this hospitalization and ongoing recovery are:: To continue therapy and obtain a hospital bed for home          Expected Discharge Plan and Services         Expected Discharge Date: 01/28/24               DME Arranged: Hospital bed DME Agency: [facility] Date DME Agency Contacted: 01/28/24 Time DME Agency Contacted: 1130 Representative spoke with at DME Agency: Mitch HH Arranged: PT, OT, RN HH Agency: [facility] Date [facility] Agency Contacted: 01/28/24 Time HH Agency Contacted: 1125 Representative spoke with at [facility] Agency: Channing  Prior Living Arrangements/Services   Lives with:: Self Patient language and need for interpreter reviewed:: Yes Do you feel safe going back to the place where you live?: Yes          Current home services: DME, Home PT, Home OT, Home RN Criminal Activity/Legal Involvement Pertinent to Current Situation/Hospitalization: No -  Comment as needed  Activities of Daily Living      Permission Sought/Granted Permission sought to share information with : Facility Medical sales representative    Share Information with NAME: Olam           Emotional Assessment Appearance:: Appears stated age     Orientation: : Oriented to Self, Oriented to Place, Oriented to  Time, Oriented to Situation Alcohol / Substance Use: Never Used    Admission diagnosis:  Shock circulatory (HCC) [R57.9] Hypoxia [R09.02] Hypotension, unspecified hypotension type [I95.9] Acute renal failure, unspecified acute renal failure type (HCC) [N17.9] Community acquired pneumonia of right lower lobe of lung [J18.9] Patient Active Problem List   Diagnosis Date Noted   Acute renal failure (HCC) 01/27/2024   Community acquired pneumonia of right lower lobe of lung 01/27/2024   Hypoxia 01/27/2024   Pressure injury of skin 01/27/2024   Shock circulatory (HCC) 01/25/2024   Hypothyroidism 08/07/2022   Dressing change 11/21/2021   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   Aortic atherosclerosis (HCC) 09/22/2019   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   Anosmia 01/19/2019   Chronic pain syndrome 05/29/2018   Lumbar radiculopathy 05/29/2018   History of lumbar laminectomy for spinal cord decompression (L2/3) 05/29/2018   CKD (chronic kidney disease) stage 4, GFR 15-29 ml/min (HCC) 05/21/2018   Dysphagia 09/10/2017   Primary osteoarthritis of left hip 04/30/2016   Urge incontinence 12/15/2015   Nocturia 12/15/2015   Urinary frequency 12/15/2015  Arthritis of ankle or foot, degenerative 11/04/2015   Status post total replacement of right hip 04/25/2015   Allergic rhinitis 12/20/2014   Arthritis 12/20/2014   At risk for falling 12/20/2014   Back pain with radiation 12/20/2014   BPH (benign prostatic hyperplasia) 12/20/2014   Carpal tunnel syndrome 12/20/2014   CHF (congestive heart failure) (HCC) 12/20/2014   Fatty infiltration  of liver 12/20/2014   History of colonic polyps 12/20/2014   Cervical pain 12/20/2014   Thrombocytopenia (HCC) 12/20/2014   Arthritis of ankle, right 12/20/2014   Varus foot deformity, acquired 12/20/2014   Insomnia    Cervical spinal stenosis 04/28/2014   DDD (degenerative disc disease), cervical 04/28/2014   Cervical radiculitis 04/01/2014   Neuritis or radiculitis due to rupture of lumbar intervertebral disc 12/21/2013   DDD (degenerative disc disease), lumbar 12/21/2013   Lumbar spondylosis 12/21/2013   Chronic gastritis 12/21/2010   Pilonidal cyst 09/05/2009   OSA on CPAP 05/07/2005   Diabetes mellitus with nephropathy (HCC) 04/22/2003   CAD (coronary artery disease) 10/05/1999   Essential (primary) hypertension 09/06/1998   Arthropathia 03/25/1996   Hypercholesterolemia 03/25/1996   Morbid obesity (HCC) 09/21/1994   GERD (gastroesophageal reflux disease) 08/10/1994   PCP:  Gasper Nancyann BRAVO, MD Pharmacy:   [facility] - [place], [place] - [address] [place] [address] Phone: [phone] Fax: [phone]  [facility]) - [place], [place] - [address] [facility] [address] [place] [address] [place] [address] Phone: [phone] Fax: [phone]  [facility] - [place] - [address] [place] [address] Phone: [phone] Fax: [phone]     Social Drivers of Health (SDOH) Social History: SDOH Screenings   Food Insecurity: No Food Insecurity (01/25/2024)  Housing: Low Risk  (01/25/2024)  Transportation Needs: No Transportation Needs (01/25/2024)  Utilities: Not At Risk (01/25/2024)  Alcohol Screen: Low Risk  (06/25/2023)  Depression (PHQ2-9): Medium Risk (01/15/2024)  Financial Resource Strain: Low Risk  (06/25/2023)  Physical Activity: Sufficiently Active (06/25/2023)  Social Connections: Socially Isolated (01/25/2024)  Stress: No Stress  Concern Present (06/25/2023)  Tobacco Use: Medium Risk (01/25/2024)  Health Literacy: Adequate Health Literacy (06/25/2023)   SDOH Interventions: Food Insecurity Interventions: Intervention Not Indicated Housing Interventions: Patient Declined   Readmission Risk Interventions     No data to display

## 2024-01-28 NOTE — Discharge Summary (Signed)
 Physician Discharge Summary   Patient: Mark Terry. MRN: [id number] DOB: [birth year]  Admit date:     01/25/2024  Discharge date: 01/28/24  Discharge Physician: Leita Blanch   PCP: Gasper Nancyann BRAVO, MD   Recommendations at discharge:    F/u PCP in 1-2 weeks  Discharge Diagnoses: Principal Problem:   Shock circulatory [facility]) Active Problems:   Acute renal failure (HCC)   Community acquired pneumonia of right lower lobe of lung   Hypoxia   Pressure injury of skin  89 yo M with hx of DM, Dyslipidemia, anemia,Bilateral rotator cuff repair/shoulder replacement surgery, HTN, Diastolic CHF, chronic bilateral lower extermity edema s/p vascular evaluation for vascular insufficinecy, chronic bronchitis with productive cough, COPD, OSA on CPAP, CKD coming in from home complaining of malaise and undue fatigue x 2 d. Reports loss of appetite found to have AKI and RLL infiltrate on workup.    Septic shock secondary to right lower lobe pneumonia acute hypoxic respiratory failure -- patient required vasopressor now off. Transferred out of ICU -- IV rocephin /zithromax --changed to po Augmentin  -- sepsis resolved -- blood cultures negative -- respiratory panel negative -- weaned to room air   Acute on chronic kidney disease stage IV/ATN in the setting of sepsis -- came in with creatinine of 4.36--- down to 2.9 --- baseline creatinine 2.0--2.3 -- nephrology consultation noted -- discontinue Foley catheter -- patient will follow-up with Dr. Douglas as outpatient.   COPD with chronic bronchitis -- not oxygen dependent -- incentive spirometer, inhalers, nabs as needed -- currently stable and on room air   Morbid obesity with obstructive sleep apnea -- patient not compliant to CPAP at home   Neuropathy bilateral, chronic DJD hip joint chronic pain -- patient at baseline is wheelchair-bound takes a couple steps at home with Holik gets physical therapy according to Dter. -- PRN pain  meds --continue Cymbalta  and gabapentin    Bilateral lower extremity edema with chronic venous stasis changes --cont Unna boot -- patient has been following at [facility] wound clinic. He gets his Radio broadcast assistant changed by home health per family       As per discussion with patient's daughters at bedside he would prefer patient returning back to home at [facility] living and will resume home health.   D/c home today. Appears improving and best at baseline.pt/ Daughters in agreement with discharge to home today.     Pain control -   Controlled Substance Reporting System database was reviewed. and patient was instructed, not to drive, operate heavy machinery, perform activities at heights, swimming or participation in water activities or provide baby-sitting services while on Pain, Sleep and Anxiety Medications; until their outpatient Physician has advised to do so again. Also recommended to not to take more than prescribed Pain, Sleep and Anxiety Medications.  Consultants: PCCM Procedures performed: none  Disposition: Home health Diet recommendation:  Discharge Diet Orders (From admission, onward)     Start     Ordered   01/28/24 0000  Diet - low sodium heart healthy        01/28/24 1056           Cardiac and Carb modified diet DISCHARGE MEDICATION: Allergies as of 01/28/2024       Reactions   Meperidine Other (See Comments), Shortness Of Breath   Short of breath   Propoxyphene Anaphylaxis, Other (See Comments), Shortness Of Breath   Short of breath   Codeine Sulfate    Patient denies   Darvon [propoxyphene  Hcl] Other (See Comments)   Short of breath   Oxycodone  Nausea Only        Medication List     PAUSE taking these medications    furosemide  40 MG tablet Wait to take this until your doctor or other care provider tells you to start again. Commonly known as: LASIX  TAKE 1 TABLET BY MOUTH DAILY   Jardiance  25 MG Tabs tablet Wait to take this until  your doctor or other care provider tells you to start again. Generic drug: empagliflozin  TAKE 1 TABLET BY MOUTH DAILY       STOP taking these medications    Accu-Chek FastClix Lancet Kit   Accu-Chek FastClix Lancets Misc   Accu-Chek Guide w/Device Kit   potassium chloride  SA 20 MEQ tablet Commonly known as: KLOR-CON  M   sulfamethoxazole -trimethoprim  800-160 MG tablet Commonly known as: BACTRIM  DS       TAKE these medications    Accu-Chek Guide test strip Generic drug: glucose blood CHECK BLOOD SUGAR ONCE DAILY FOR TYPE 2 DIABETES   alfuzosin  10 MG 24 hr tablet Commonly known as: UROXATRAL  Take 1 tablet (10 mg total) by mouth daily with breakfast. Take in place of doxazosin    amoxicillin -clavulanate 500-125 MG tablet Commonly known as: AUGMENTIN  Take 1 tablet by mouth every 12 (twelve) hours for 4 days.   ascorbic acid  500 MG tablet Commonly known as: VITAMIN C  Take 1 tablet (500 mg total) by mouth 2 (two) times daily.   aspirin  81 MG tablet Take 81 mg by mouth daily.   DULoxetine  30 MG capsule Commonly known as: CYMBALTA  TAKE 1 CAPSULE BY MOUTH EVERY DAY   feeding supplement Liqd Take 237 mLs by mouth 2 (two) times daily between meals.   fluticasone  50 MCG/ACT nasal spray Commonly known as: FLONASE  USE 1 TO 2 SPRAYS IN BOTH  NOSTRILS DAILY   gabapentin  300 MG capsule Commonly known as: NEURONTIN  TAKE 1 CAPSULE BY MOUTH 3 TIMES  DAILY   glimepiride  1 MG tablet Commonly known as: AMARYL  TAKE 1 TABLET BY MOUTH DAILY   HYDROcodone -acetaminophen  5-325 MG tablet Commonly known as: NORCO/VICODIN TAKE 1 TABLET BY MOUTH TWICE DAILY AS NEEDED   levothyroxine  50 MCG tablet Commonly known as: SYNTHROID  TAKE 1 TABLET BY MOUTH DAILY   loratadine  10 MG tablet Commonly known as: CLARITIN  Take 10 mg by mouth daily.   metoprolol  tartrate 25 MG tablet Commonly known as: LOPRESSOR  TAKE 1 TABLET BY MOUTH TWICE  DAILY   multivitamin with minerals Tabs  tablet Take 1 tablet by mouth daily.   omeprazole  40 MG capsule Commonly known as: PRILOSEC TAKE 1 CAPSULE BY MOUTH DAILY   oxybutynin  5 MG tablet Commonly known as: DITROPAN  TAKE 1 TABLET BY MOUTH TWICE  DAILY   simvastatin  40 MG tablet Commonly known as: ZOCOR  TAKE 1 TABLET BY MOUTH AT  BEDTIME   traZODone  100 MG tablet Commonly known as: DESYREL  TAKE 1/2 TO 1 TABLET BY MOUTH AT BEDTIME AS NEEDED. FOR SLEEP   Vitamin D  (Ergocalciferol ) 1.25 MG (50000 UNIT) Caps capsule Commonly known as: DRISDOL  Take 50,000 Units by mouth every 7 (seven) days.               Discharge Care Instructions  (From admission, onward)           Start     Ordered   01/28/24 0000  Discharge wound care:       Comments: 01/25/24 2200    Wound care  Daily  Comments: 1.        Cleanse sacral wound with Vashe wound cleanser Soila [phone]) do not rinse and allow to air dry. Using a Q tip applicator apply Vashe moistened gauze to wound bed daily, cover with dry gauze and silicone foam or ABD pad whichever is preferred.  2.Cleanse R heel wound with Vashe, apply Xeroform gauze (Lawson [phone]) to wound bed daily, secure with silicone foam or dry gauze and Kerlix roll gauze whichever is preferred. Place R foot in Prevalon boot Soila [phone]) to offload pressure  01/25/24 2040    01/25/24 2200    Wound care  3 times daily      Comments: Cleanse groin/scrotum/inner thighs/buttocks with Vashe wound cleanser, do not rinse and allow to air dry. Apply Gerhardt's Butt Cream 3 times a day and prn soiling.  May sprinkle over Gerhardt's with floor stock antifungal powder (Microguard white and green label) for extra drying effect  01/25/24 2046   01/28/24 1056            Follow-up Information     Fisher, Nancyann BRAVO, MD. Schedule an appointment as soon as possible for a visit in 1 week(s).   Specialty: Family Medicine Why: Hospital follow-up Contact information: [address] [place]  [address] [place] [address] [phone]                Discharge Exam: Fredricka Weights   01/27/24 0030 01/27/24 0426 01/28/24 0454  Weight: 98.8 kg 98.8 kg 100.3 kg   GENERAL:  89 y.o.-year-old patient with no acute distress. Morbidly obese.  LUNGS: decreased breath sounds bilaterally, no wheezing CARDIOVASCULAR: S1, S2 normal. No murmur   ABDOMEN: Soft, nontender, nondistended.  EXTREMITIES: bilateral unna boot NEUROLOGIC: nonfocal  patient is alert and awake  Condition at discharge: fair  The results of significant diagnostics from this hospitalization (including imaging, microbiology, ancillary and laboratory) are listed below for reference.   Imaging Studies: DG Chest Port 1 View Result Date: 01/27/2024 CLINICAL DATA:  [id number] Right lower lobe pneumonia [id number] EXAM: PORTABLE CHEST 1 VIEW COMPARISON:  Chest x-ray 01/25/2024 FINDINGS: Patient is rotated. The heart and mediastinal contours are within normal limits. Low lung volumes. Improved aeration of the right lower lung zone. No focal consolidation. No pulmonary edema. No pleural effusion. No pneumothorax. No acute osseous abnormality. Total reverse bilateral shoulder arthroplasties partially visualized. Chronically fractured sternotomy wires. IMPRESSION: Limitation due to low lung volumes with patient rotation. Improved aeration of the right lower lung zone. No acute cardiopulmonary abnormality. Electronically Signed   By: Morgane  Naveau M.D.   On: 01/27/2024 17:09   US  RENAL Result Date: 01/25/2024 CLINICAL DATA:  Acute kidney injury. EXAM: RENAL / URINARY TRACT ULTRASOUND COMPLETE COMPARISON:  Dec 06, 2023 FINDINGS: Right Kidney: Renal measurements: 9.3 cm x 6.0 cm x 5.1 cm = volume: 147 mL. Thinning of the renal cortex is noted. Mildly increased echogenicity of the renal parenchyma is noted. No mass or hydronephrosis visualized. Left Kidney: Renal measurements: 11.2 cm x 5.8 cm x 5.0 cm = volume: 170.5 mL. Echogenicity within  normal limits. A stable 2.6 cm x 2.8 cm x 2.0 cm renal cyst is seen within the lateral aspect of the mid left kidney. No hydronephrosis is visualized. Bladder: A Foley catheter is present within the urinary bladder. Other: None. IMPRESSION: 1. Mildly echogenic right kidney with mild thinning of the renal cortex which may represent sequelae associated with medical renal disease. 2. Stable left renal cyst. Electronically Signed   By:  Suzen Dials M.D.   On: 01/25/2024 21:24   DG Chest 2 View Result Date: 01/25/2024 CLINICAL DATA:  Hypotension.  Suspected sepsis.  Urinary retention. EXAM: CHEST - 2 VIEW COMPARISON:  06/15/2016. FINDINGS: Stable changes from previous CABG surgery. Cardiac silhouette is normal in size and configuration. No mediastinal or hilar masses or convincing adenopathy. There is consolidation at the right lung base, posteromedial aspect of the right lower lobe. Few linear opacities are noted at the left lung base and left mid lung consistent with atelectasis/scarring. Remainder of the lungs is clear. No convincing pleural effusion and no pneumothorax. IMPRESSION: Right lower lobe pneumonia. Electronically Signed   By: Alm Parkins M.D.   On: 01/25/2024 13:14    Microbiology: Results for orders placed or performed during the hospital encounter of 01/25/24  Culture, blood (Routine x 2)     Status: None (Preliminary result)   Collection Time: 01/25/24 12:46 PM   Specimen: BLOOD  Result Value Ref Range Status   Specimen Description BLOOD LEFT ANTECUBITAL  Final   Special Requests   Final    BOTTLES DRAWN AEROBIC AND ANAEROBIC Blood Culture adequate volume   Culture   Final    NO GROWTH 2 DAYS Performed at [facility], [address], [place], [place] [address]    Report Status PENDING  Incomplete  Culture, blood (Routine x 2)     Status: None (Preliminary result)   Collection Time: 01/25/24  1:17 PM   Specimen: BLOOD  Result Value Ref Range Status   Specimen  Description BLOOD BLOOD RIGHT FOREARM  Final   Special Requests   Final    BOTTLES DRAWN AEROBIC AND ANAEROBIC Blood Culture results may not be optimal due to an inadequate volume of blood received in culture bottles   Culture   Final    NO GROWTH 2 DAYS Performed at [facility], [address] [place], [place] [address]    Report Status PENDING  Incomplete  Respiratory (~20 pathogens) panel by PCR     Status: None   Collection Time: 01/25/24  3:34 PM  Result Value Ref Range Status   Adenovirus NOT DETECTED NOT DETECTED Final   Coronavirus 229E NOT DETECTED NOT DETECTED Final    Comment: (NOTE) The Coronavirus on the Respiratory Panel, DOES NOT test for the novel  Coronavirus (2019 nCoV)    Coronavirus HKU1 NOT DETECTED NOT DETECTED Final   Coronavirus NL63 NOT DETECTED NOT DETECTED Final   Coronavirus OC43 NOT DETECTED NOT DETECTED Final   Metapneumovirus NOT DETECTED NOT DETECTED Final   Rhinovirus / Enterovirus NOT DETECTED NOT DETECTED Final   Influenza A NOT DETECTED NOT DETECTED Final   Influenza B NOT DETECTED NOT DETECTED Final   Parainfluenza Virus 1 NOT DETECTED NOT DETECTED Final   Parainfluenza Virus 2 NOT DETECTED NOT DETECTED Final   Parainfluenza Virus 3 NOT DETECTED NOT DETECTED Final   Parainfluenza Virus 4 NOT DETECTED NOT DETECTED Final   Respiratory Syncytial Virus NOT DETECTED NOT DETECTED Final   Bordetella pertussis NOT DETECTED NOT DETECTED Final   Bordetella Parapertussis NOT DETECTED NOT DETECTED Final   Chlamydophila pneumoniae NOT DETECTED NOT DETECTED Final   Mycoplasma pneumoniae NOT DETECTED NOT DETECTED Final    Comment: Performed at [facility], [address], [place], [place] [address]  SARS Coronavirus 2 by RT PCR (hospital order, performed in [facility] hospital lab) *cepheid single result test*     Status: None   Collection  Time: 01/25/24  3:34 PM  Result Value Ref Range Status   SARS Coronavirus 2 by RT PCR NEGATIVE  NEGATIVE Final    Comment: (NOTE) SARS-CoV-2 target nucleic acids are NOT DETECTED.  The SARS-CoV-2 RNA is generally detectable in upper and lower respiratory specimens during the acute phase of infection. The lowest concentration of SARS-CoV-2 viral copies this assay can detect is 250 copies / mL. A negative result does not preclude SARS-CoV-2 infection and should not be used as the sole basis for treatment or other patient management decisions.  A negative result may occur with improper specimen collection / handling, submission of specimen other than nasopharyngeal swab, presence of viral mutation(s) within the areas targeted by this assay, and inadequate number of viral copies (<250 copies / mL). A negative result must be combined with clinical observations, patient history, and epidemiological information.  Fact Sheet for Patients:   RoadLapTop.co.za  Fact Sheet for Healthcare Providers: http://kim-miller.com/  This test is not yet approved or  cleared by the [place]  FDA and has been authorized for detection and/or diagnosis of SARS-CoV-2 by FDA under an Emergency Use Authorization (EUA).  This EUA will remain in effect (meaning this test can be used) for the duration of the COVID-19 declaration under Section 564(b)(1) of the Act, 21 U.S.C. section 360bbb-3(b)(1), unless the authorization is terminated or revoked sooner.  Performed at [facility], [address] [place], [place] [address]   MRSA Next Gen by PCR, Nasal     Status: None   Collection Time: 01/25/24  3:46 PM   Specimen: Nasal Mucosa; Nasal Swab  Result Value Ref Range Status   MRSA by PCR Next Gen NOT DETECTED NOT DETECTED Final    Comment: (NOTE) The GeneXpert MRSA Assay (FDA approved for NASAL specimens only), is one component of a comprehensive MRSA colonization surveillance program. It is not intended to diagnose MRSA infection nor to  guide or monitor treatment for MRSA infections. Test performance is not FDA approved in patients less than 50 years old. Performed at [facility], [address] [place], [place] [address]   MRSA Next Gen by PCR, Nasal     Status: None   Collection Time: 01/25/24  4:42 PM   Specimen: Nasal Mucosa; Nasal Swab  Result Value Ref Range Status   MRSA by PCR Next Gen NOT DETECTED NOT DETECTED Final    Comment: (NOTE) The GeneXpert MRSA Assay (FDA approved for NASAL specimens only), is one component of a comprehensive MRSA colonization surveillance program. It is not intended to diagnose MRSA infection nor to guide or monitor treatment for MRSA infections. Test performance is not FDA approved in patients less than 96 years old. Performed at [facility], [address] [place], [place] [address]     Labs: CBC: Recent Labs  Lab 01/25/24 1246 01/27/24 0500  WBC 6.2 4.7  NEUTROABS 4.0 2.8  HGB 10.1* 9.7*  HCT 31.7* 30.5*  MCV 98.8 99.7  PLT 107* 102*   Basic Metabolic Panel: Recent Labs  Lab 01/25/24 1246 01/26/24 0421 01/27/24 0500  NA 134* 131* 136  K 4.9 4.9 5.2*  CL 99 102 106  CO2 21* 18* 22  GLUCOSE 87 110* 114*  BUN 80* 65* 54*  CREATININE 5.49* 4.36* 2.97*  CALCIUM 8.4* 8.2* 8.8*  MG  --  2.1 2.3  PHOS  --  5.0* 3.6   Liver Function Tests: Recent Labs  Lab 01/25/24 1246 01/26/24 0421 01/27/24 0500  AST  24  --   --   ALT 23  --   --   ALKPHOS 61  --   --   BILITOT 0.4  --   --   PROT 7.2  --   --   ALBUMIN 3.6 2.8* 2.9*   CBG: Recent Labs  Lab 01/25/24 1639  GLUCAP 93    Discharge time spent: greater than 30 minutes.  Signed: Leita Blanch, MD [facility] 01/28/2024

## 2024-01-28 NOTE — Progress Notes (Signed)
    Durable Medical Equipment  (From admission, onward)           Start     Ordered   01/28/24 1122  For home use only DME Hospital bed  Once       Question Answer Comment  Length of Need Lifetime   Patient has (list medical condition): Stage 3 sacral wound, COPD   The above medical condition requires: Patient requires the ability to reposition frequently   Head must be elevated greater than: 30 degrees   Bed type Semi-electric   Support Surface: Low Air loss Mattress      01/28/24 1122

## 2024-01-29 ENCOUNTER — Telehealth: Payer: Self-pay

## 2024-01-29 NOTE — Transitions of Care (Post Inpatient/ED Visit) (Signed)
 01/29/2024  Name: Mark Terry. MRN: [id number] DOB: 09-26-1934  Today's TOC FU Call Status: Today's TOC FU Call Status:: Successful TOC FU Call Completed TOC FU Call Complete Date: 01/29/24 Patient's Name and Date of Birth confirmed.  Transition Care Management Follow-up Telephone Call Date of Discharge: 01/28/24 Discharge Facility: [facility]) Type of Discharge: Inpatient Admission Primary Inpatient Discharge Diagnosis:: Sepsis secondary to pneumonia; CKD (initial spoke with daughter, Olam) How have you been since you were released from the hospital?: Better Any questions or concerns?: Yes Patient Questions/Concerns:: Concerns for how many times will HH be able to come out and do wound care Patient Questions/Concerns Addressed: Other: (AVS reviewed for 3 times a day and explained how HH generally teaches the family to provide care to wound as needed and to notify MD of wound changes)  Items Reviewed: Did you receive and understand the discharge instructions provided?: Yes Medications obtained,verified, and reconciled?: Yes (Medications Reviewed) Any new allergies since your discharge?: No Dietary orders reviewed?: Yes Type of Diet Ordered:: Heart Healthy; Ensure Plus High Protein Giles, dtr states ordered to pick up at pharmacy but pharmacist  states it's OTC, dtr will check around to retail stores etc.)  Medications Reviewed Today: Medications Reviewed Today     Reviewed by Eilleen Richerd GRADE, RN (Registered Nurse) on 01/29/24 at 1205  Med List Status: <None>   Medication Order Taking? Sig Documenting Provider Last Dose Status Informant  ACCU-CHEK GUIDE test strip [id number] Yes CHECK BLOOD SUGAR ONCE DAILY FOR TYPE 2 DIABETES Gasper Nancyann BRAVO, MD  Active   alfuzosin  (UROXATRAL ) 10 MG 24 hr tablet [id number] Yes Take 1 tablet (10 mg total) by mouth daily with breakfast. Take in place of doxazosin  Gasper Nancyann BRAVO, MD  Active    amoxicillin -clavulanate (AUGMENTIN ) 500-125 MG tablet [id number] Yes Take 1 tablet by mouth every 12 (twelve) hours for 4 days. Patel, Sona, MD  Active   ascorbic acid  (VITAMIN C ) 500 MG tablet [id number] Yes Take 1 tablet (500 mg total) by mouth 2 (two) times daily. Patel, Sona, MD  Active   aspirin  81 MG tablet [id number] Yes Take 81 mg by mouth daily. [provider]  Active Self           Med Note HALLIE, FELECIA N   Tue Oct 11, 2020 11:09 AM) Reports taking every other day.  DULoxetine  (CYMBALTA ) 30 MG capsule [id number] Yes TAKE 1 CAPSULE BY MOUTH EVERY DAY Gasper Nancyann BRAVO, MD  Active   empagliflozin  (JARDIANCE ) 25 MG TABS tablet [id number]  TAKE 1 TABLET BY MOUTH DAILY Gasper Nancyann BRAVO, MD  Active   feeding supplement (ENSURE PLUS HIGH PROTEIN) LIQD [id number]  Take 237 mLs by mouth 2 (two) times daily between meals. Patel, Sona, MD  Active   fluticasone  (FLONASE ) 50 MCG/ACT nasal spray [id number] Yes USE 1 TO 2 SPRAYS IN BOTH  NOSTRILS DAILY Gasper Nancyann BRAVO, MD  Active   furosemide  (LASIX ) 40 MG tablet [id number]  TAKE 1 TABLET BY MOUTH DAILY Gasper Nancyann BRAVO, MD  Active   gabapentin  (NEURONTIN ) 300 MG capsule [id number] Yes TAKE 1 CAPSULE BY MOUTH 3 TIMES  DAILY Gasper Nancyann BRAVO, MD  Active   glimepiride  (AMARYL ) 1 MG tablet [id number] Yes TAKE 1 TABLET BY MOUTH DAILY Gasper Nancyann BRAVO, MD  Active   HYDROcodone -acetaminophen  (NORCO/VICODIN) 5-325 MG tablet [id number] Yes TAKE 1 TABLET BY MOUTH TWICE DAILY AS NEEDED [provider]  Active   levothyroxine  (SYNTHROID ) 50 MCG tablet  [id number] Yes TAKE 1 TABLET BY MOUTH DAILY Gasper Nancyann BRAVO, MD  Active   loratadine  (CLARITIN ) 10 MG tablet [id number] Yes Take 10 mg by mouth daily. [provider]  Active Self  metoprolol  tartrate (LOPRESSOR ) 25 MG tablet [id number] Yes TAKE 1 TABLET BY MOUTH TWICE  DAILY Gasper Nancyann BRAVO, MD  Active   Multiple Vitamin (MULTIVITAMIN WITH MINERALS) TABS [id number] Yes Take 1 tablet by mouth daily.  [provider]  Active Self  omeprazole  (PRILOSEC) 40 MG capsule [id number] Yes TAKE 1 CAPSULE BY MOUTH DAILY Gasper Nancyann BRAVO, MD  Active   oxybutynin  (DITROPAN ) 5 MG tablet [id number] Yes TAKE 1 TABLET BY MOUTH TWICE  DAILY Gasper Nancyann BRAVO, MD  Active   simvastatin  (ZOCOR ) 40 MG tablet [id number] Yes TAKE 1 TABLET BY MOUTH AT  BEDTIME Gasper Nancyann BRAVO, MD  Active   traZODone  (DESYREL ) 100 MG tablet [id number] Yes TAKE 1/2 TO 1 TABLET BY MOUTH AT BEDTIME AS NEEDED. FOR SLEEP Gasper Nancyann BRAVO, MD  Active   Vitamin D , Ergocalciferol , (DRISDOL ) 1.25 MG (50000 UNIT) CAPS capsule [id number] Yes Take 50,000 Units by mouth every 7 (seven) days. [provider]  Active   Med List Note Wendelyn Pulling, RN 08/26/18 [id number]): Medication agreement and Opiod contract signed 08/26/18 MR 10/31/18 UDS 08/26/18 Serum drug screen 05/30/19            Home Care and Equipment/Supplies: Were Home Health Services Ordered?: Yes Name of Home Health Agency:: [facility] Has Agency set up a time to come to your home?: No Giles states she has contacted them and they're to call her back) EMR reviewed for Home Health Orders: Orders present/patient has not received call (refer to CM for follow-up) (awaiting call back from agency - [facility]) Any new equipment or medical supplies ordered?: Yes Name of Medical supply agency?: [facility] Were you able to get the equipment/medical supplies?:  (Hospital bed to be delivered today) Do you have any questions related to the use of the equipment/supplies?: No  Functional Questionnaire: Do you need assistance with bathing/showering or dressing?: No Do you need assistance with meal preparation?: No (Patient at [facility], they prepare the 3 meals a day for residents) Do you need assistance with eating?: No Do you need assistance with getting out of bed/getting out of a chair/moving?: No Do you have difficulty managing or taking your medications?: No  Follow up  appointments reviewed: PCP Follow-up appointment confirmed?: Yes Date of PCP follow-up appointment?: 02/05/24 Follow-up Provider: Dr. Nancyann Gasper Specialist [facility] Follow-up appointment confirmed?: Yes Date of Specialist follow-up appointment?: 02/05/24 Follow-Up Specialty Provider:: Douglas Rule - Nephrology Do you need transportation to your follow-up appointment?: No Do you understand care options if your condition(s) worsen?: Yes-patient verbalized understanding  SDOH Interventions Today    Flowsheet Row Most Recent Value  SDOH Interventions   Food Insecurity Interventions Intervention Not Indicated  Housing Interventions Intervention Not Indicated  Transportation Interventions Intervention Not Indicated  Utilities Interventions Intervention Not Indicated    Goals Addressed             This Visit's Progress    VBCI Transitions of Care (TOC) Care Plan       Problems:  Recent Hospitalization for treatment of CKD Stage 4 and Septic Shock secondary to pneumonia Diet/Nutrition/Food Resources Need to pick up Ensure Plus High Protein, Equipment/DME barrier Hospital bed delivery scheduled for today , and Knowledge Deficit Related to management of wound care needs  Goal:  Over the next  30 days, the patient will not experience hospital readmission  Interventions:    Chronic Kidney Disease Interventions: Assessed the patient and daughter [name]'s  understanding of chronic kidney disease    Evaluation of current treatment plan related to chronic kidney disease self management and patient's adherence to plan as established by provider      Reviewed prescribed diet Heart Healthy Diabetic with high Protein Ensure Plus Reviewed scheduled/upcoming provider appointments including    Discussed plans with patient for ongoing care management follow up and provided patient with direct contact information for care management team    Last practice recorded BP readings:  BP Readings from  Last 3 Encounters:  01/28/24 116/62  01/15/24 113/66  12/30/23 (!) 101/54   Most recent eGFR/CrCl:  Lab Results  Component Value Date   EGFR 27 (L) 09/25/2023    No components found for: CRCL  Patient Self Care Activities:  Attend all scheduled provider appointments Call pharmacy for medication refills 3-7 days in advance of running out of medications Call provider office for new concerns or questions  Notify RN Care Manager of TOC call rescheduling needs Participate in Transition of Care Program/Attend TOC scheduled calls Take medications as prescribed    Plan:  Telephone follow up appointment with care management team member scheduled for:  February 06, 2024 at  The care management team will reach out to the patient again over the next 5-10 business days.        Richerd Fish, RN, BSN, CCM [facility], [facility] RN Care Manager Direct Dial: [phone]

## 2024-01-29 NOTE — Progress Notes (Signed)
   01/25/24 1632  Wound/Incision (LDAs)  Type of Wound/Incision (LDA) Wound  Wound 01/25/24 2003 Irritant Contact Dermatitis Groin Anterior;Left;Right;Medial  Date First Assessed/Time First Assessed: 01/25/24 2003   Primary Wound Type: Irritant Contact Dermatitis  Secondary Wound Type - Irritant Contact Dermatitis: Incontinence  Location: Groin  Location Orientation: Anterior;Left;Right;Medial  Wound Image   Site / Wound Assessment Pink;Red  Peri-wound Assessment Pink  Wound Length (cm) 12 cm  Wound Width (cm) 12 cm  Wound Surface Area (cm^2) 113.1 cm^2  Wound Depth (cm) 0 cm  Wound Volume (cm^3) 0 cm^3  Drainage Description Odor - foul;Serosanguineous  Drainage Amount Moderate  Treatments Cleansed;Site care

## 2024-01-29 NOTE — Progress Notes (Signed)
 01/25/24 1630  Charting Type  Charting Type Shift Assessment  Morris Work Intensity Score (Update with each assessment and as needed)  Work Intensity Score (Level) 3  Level 3 Intensity X.IV drip rate increase Q 1-2 hours (Stable)  Neurological  Neuro (WDL) X  Orientation Level Oriented X4  Cognition Appropriate at baseline;Appropriate attention/concentration;Appropriate judgement  Speech Clear  R Pupil Size (mm) 3  R Pupil Shape Round  R Pupil Reaction Brisk  L Pupil Size (mm) 3  L Pupil Shape Round  L Pupil Reaction Brisk  Glasgow Coma Scale  Eye Opening 4  Best Verbal Response (NON-intubated) 5  Best Motor Response 6  Glasgow Coma Scale Score 15  NuDESC - Delirium Risk Factor Assessment (Complete for non-ICU patients)  Delirium Risk Factor Assessment Age greater than or equal to 65 years  NuDESC - Nursing Delirium Screening Scale (Complete for non-ICU patients)  Disorientation 0  Inappropriate Behavior 0  Inappropriate Communications 0  Illusions/hallucinations 0  Psychomotor Retardation 0  NuDESC Total Score 0  NuDESC - Delirium Prevention:  Universal Requirements (Complete for all non-ICU patients with a delirium risk factor)  Universal Precautions Initiated *See Row Information* Yes  Oral Assessment (Complete on admission/transfer/every shift)  Oral Assessment  (WDL) X  Lips Symmetrical  Teeth Missing (Comment)  Tongue Pink;Moist  Mucous Membrane(s) Moist;Pink  Saliva Moist, saliva free flowing  Is patient on any of following O2 devices? None of the above  Nutritional status No high risk factors  Oral Assessment Risk  Low Risk  HEENT  HEENT (WDL) X  Vision Check No  Respiratory  Respiratory Pattern Regular  Chest Assessment Chest expansion symmetrical  Bilateral Breath Sounds Clear;Diminished  Respiratory (WDL) X  Cardiac  Cardiac (WDL) X  Pulse Regular  Heart Sounds S1, S2  Jugular Venous Distention (JVD) Yes  ECG Monitoring  ECG Heart Rate 80   Cardiac Rhythm NSR  Antiarrhythmic device  Antiarrhythmic device No  Vascular  Vascular (WDL) X  Capillary Refill Less than 3 seconds  Pulses R radial;L radial;R dorsalis pedis;L dorsalis pedis  RUE Neurovascular Assessment  R Radial Pulse +2  LUE Neurovascular Assessment  L Radial Pulse +2  RLE Neurovascular Assessment  R Dorsalis Pedis Pulse +1  LLE Neurovascular Assessment  L Dorsalis Pedis Pulse +1  Musculoskeletal  Musculoskeletal (WDL) X  Gastrointestinal  Gastrointestinal (WDL) X  Abdomen Inspection Distended;Obese  Bowel Sounds Assessment Active  Tenderness Nontender  Last BM Date   (PTA)  GU Assessment  Genitourinary (WDL) X  Genitourinary Symptoms Existing urinary catheter  Genitalia  Male Genitalia Intact (redness)  Urine Measurement/Characteristics  Urinary Incontinence No  Urine Color Yellow/straw  Urine Appearance Clear  Hygiene Foley care AND Peri care;Peri care  Psychosocial  Psychosocial (WDL) WDL  Integumentary  Integumentary (WDL) X  Nurse assisting at admit/transfer - Full skin assessment Duwaine FALCON, RN  Skin Color Appropriate for ethnicity  Skin Condition Dry  Skin Integrity Abrasion;Ecchymosis;Weeping;Erythema/redness  Abrasion Location Leg  Abrasion Location Orientation Right;Left;Bilateral  Abrasion Intervention Cleansed  Ecchymosis Location Arm;Leg  Ecchymosis Location Orientation Right;Left;Lower;Upper  Erythema/Redness Location Groin;Scrotum  Erythema/Redness Location Orientation Right;Left;Bilateral  Weeping Location Groin  Weeping Location Orientation Right;Left  Weeping Intervention Other (Comment);Cleansed (antifungal powder)  Skin Turgor Non-tenting  Sacral Foam Prophylactic Dressing  Dressing Interventions Dressing placed and dated/protocol started  Braden Scale (Ages 8 and up)  Sensory Perceptions 3  Moisture 3  Activity 3  Mobility 3  Nutrition 3  Friction and Shear 1  Braden Scale Score 16  Braden Interventions   Required Braden Scale Bundle Interventions *See Row Information* At High Risk for pressure injury (<13) - At Risk & At High Risk requirements implemented  Patient NOT compliant with following measures Reposition approximately every 2 hours;Float heels  Wound/Incision (LDAs)  Type of Wound/Incision (LDA) Pressure injury  Wound 01/25/24 1630 Pressure Injury Sacrum Medial Stage 3 -  Full thickness tissue loss. Subcutaneous fat may be visible but bone, tendon or muscle are NOT exposed.  Date First Assessed/Time First Assessed: 01/25/24 1630   Present on Original Admission: Yes  Primary Wound Type: Pressure Injury  Location: Sacrum  Location Orientation: Medial  Staging: Stage 3 -  Full thickness tissue loss. Subcutaneous fat may be v...  Wound Image   Site / Wound Assessment Clean;Pink;Red  Peri-wound Assessment Pink  Wound Length (cm) 6 cm  Wound Width (cm) 3 cm  Wound Surface Area (cm^2) 14.14 cm^2  Wound Depth (cm) 1 cm  Wound Volume (cm^3) 9.425 cm^3  Drainage Description Serosanguineous  Drainage Amount Small  Treatment Cleansed  Dressing Type Foam - Lift dressing to assess site every shift  Neurological  Level of Consciousness Alert

## 2024-01-29 NOTE — Patient Instructions (Signed)
 Visit Information  Thank you for taking time to visit with me today. Please don't hesitate to contact me if I can be of assistance to you before our next scheduled telephone appointment.  Our next appointment is by telephone on February 06, 2024 at 10:00 AM  Following is a copy of your care plan:   Goals Addressed             This Visit's Progress    VBCI Transitions of Care (TOC) Care Plan       Problems:  Recent Hospitalization for treatment of CKD Stage 4 and Septic Shock secondary to pneumonia Diet/Nutrition/Food Resources Need to pick up Ensure Plus High Protein, Equipment/DME barrier Hospital bed delivery scheduled for today , and Knowledge Deficit Related to management of wound care needs  Goal:  Over the next 30 days, the patient will not experience hospital readmission  Interventions:    Chronic Kidney Disease Interventions: Assessed the patient and daughter [name]'s  understanding of chronic kidney disease    Evaluation of current treatment plan related to chronic kidney disease self management and patient's adherence to plan as established by provider      Reviewed prescribed diet Heart Healthy Diabetic with high Protein Ensure Plus Reviewed scheduled/upcoming provider appointments including    Discussed plans with patient for ongoing care management follow up and provided patient with direct contact information for care management team    Last practice recorded BP readings:  BP Readings from Last 3 Encounters:  01/28/24 116/62  01/15/24 113/66  12/30/23 (!) 101/54   Most recent eGFR/CrCl:  Lab Results  Component Value Date   EGFR 27 (L) 09/25/2023    No components found for: CRCL  Patient Self Care Activities:  Attend all scheduled provider appointments Call pharmacy for medication refills 3-7 days in advance of running out of medications Call provider office for new concerns or questions  Notify RN Care Manager of TOC call rescheduling needs Participate in  Transition of Care Program/Attend TOC scheduled calls Take medications as prescribed    Plan:  Telephone follow up appointment with care management team member scheduled for:  February 06, 2024 at  The care management team will reach out to the patient again over the next 5-10 business days.        Patient verbalizes understanding of instructions and care plan provided today and agrees to view in MyChart. Active MyChart status and patient understanding of how to access instructions and care plan via MyChart confirmed with patient.     The patient has been provided with contact information for the care management team and has been advised to call with any health related questions or concerns.  The care management team will reach out to the patient again over the next 5- 10 business days.  The patient will call Home Health* as advised to if no return call by tomorrow, daughter has already contacted and awaiting a return call from [facility].  Next PCP appointment scheduled for: February 05, 2024  Please call the care guide team at [phone] if you need to cancel or reschedule your appointment.   Please call the [place]  National Suicide Prevention Lifeline: 703-070-5965 or TTY: 262-704-9335 TTY 640-059-7364) to talk to a trained counselor call 1-800-273-TALK (toll free, 24 hour hotline) if you are experiencing a Mental Health or Behavioral Health Crisis or need someone to talk to.  Richerd Fish, RN, BSN, CCM [facility], [facility] RN Care Manager Direct Dial: [phone]

## 2024-01-30 ENCOUNTER — Telehealth: Payer: Self-pay

## 2024-01-30 DIAGNOSIS — Z79891 Long term (current) use of opiate analgesic: Secondary | ICD-10-CM | POA: Diagnosis not present

## 2024-01-30 DIAGNOSIS — I5032 Chronic diastolic (congestive) heart failure: Secondary | ICD-10-CM | POA: Diagnosis not present

## 2024-01-30 DIAGNOSIS — N183 Chronic kidney disease, stage 3 unspecified: Secondary | ICD-10-CM | POA: Diagnosis not present

## 2024-01-30 DIAGNOSIS — Z87891 Personal history of nicotine dependence: Secondary | ICD-10-CM | POA: Diagnosis not present

## 2024-01-30 DIAGNOSIS — Z981 Arthrodesis status: Secondary | ICD-10-CM | POA: Diagnosis not present

## 2024-01-30 DIAGNOSIS — Z7984 Long term (current) use of oral hypoglycemic drugs: Secondary | ICD-10-CM | POA: Diagnosis not present

## 2024-01-30 DIAGNOSIS — I25119 Atherosclerotic heart disease of native coronary artery with unspecified angina pectoris: Secondary | ICD-10-CM | POA: Diagnosis not present

## 2024-01-30 DIAGNOSIS — Z85828 Personal history of other malignant neoplasm of skin: Secondary | ICD-10-CM | POA: Diagnosis not present

## 2024-01-30 DIAGNOSIS — Z9181 History of falling: Secondary | ICD-10-CM | POA: Diagnosis not present

## 2024-01-30 DIAGNOSIS — D631 Anemia in chronic kidney disease: Secondary | ICD-10-CM | POA: Diagnosis not present

## 2024-01-30 DIAGNOSIS — J449 Chronic obstructive pulmonary disease, unspecified: Secondary | ICD-10-CM | POA: Diagnosis not present

## 2024-01-30 DIAGNOSIS — M48062 Spinal stenosis, lumbar region with neurogenic claudication: Secondary | ICD-10-CM | POA: Diagnosis not present

## 2024-01-30 DIAGNOSIS — K219 Gastro-esophageal reflux disease without esophagitis: Secondary | ICD-10-CM | POA: Diagnosis not present

## 2024-01-30 DIAGNOSIS — Z96612 Presence of left artificial shoulder joint: Secondary | ICD-10-CM | POA: Diagnosis not present

## 2024-01-30 DIAGNOSIS — S31000A Unspecified open wound of lower back and pelvis without penetration into retroperitoneum, initial encounter: Secondary | ICD-10-CM

## 2024-01-30 DIAGNOSIS — E1122 Type 2 diabetes mellitus with diabetic chronic kidney disease: Secondary | ICD-10-CM | POA: Diagnosis not present

## 2024-01-30 DIAGNOSIS — Z96641 Presence of right artificial hip joint: Secondary | ICD-10-CM | POA: Diagnosis not present

## 2024-01-30 DIAGNOSIS — K760 Fatty (change of) liver, not elsewhere classified: Secondary | ICD-10-CM | POA: Diagnosis not present

## 2024-01-30 DIAGNOSIS — G4733 Obstructive sleep apnea (adult) (pediatric): Secondary | ICD-10-CM | POA: Diagnosis not present

## 2024-01-30 DIAGNOSIS — Z96611 Presence of right artificial shoulder joint: Secondary | ICD-10-CM | POA: Diagnosis not present

## 2024-01-30 DIAGNOSIS — Z951 Presence of aortocoronary bypass graft: Secondary | ICD-10-CM | POA: Diagnosis not present

## 2024-01-30 DIAGNOSIS — E7800 Pure hypercholesterolemia, unspecified: Secondary | ICD-10-CM | POA: Diagnosis not present

## 2024-01-30 DIAGNOSIS — R339 Retention of urine, unspecified: Secondary | ICD-10-CM | POA: Diagnosis not present

## 2024-01-30 DIAGNOSIS — I130 Hypertensive heart and chronic kidney disease with heart failure and stage 1 through stage 4 chronic kidney disease, or unspecified chronic kidney disease: Secondary | ICD-10-CM | POA: Diagnosis not present

## 2024-01-30 LAB — CULTURE, BLOOD (ROUTINE X 2)
Culture: NO GROWTH
Culture: NO GROWTH
Special Requests: ADEQUATE

## 2024-01-30 NOTE — Telephone Encounter (Signed)
 Copied from CRM #[id number]. Topic: Clinical - Home Health Verbal Orders >> Jan 30, 2024 11:26 AM Edsel HERO wrote: Caller/Agency: Jewel/[facility] Callback Number: [phone] Service Requested: Skilled Nursing - Resumption of Care Frequency: 1w2 Any new concerns about the patient? Yes ----------------------------------------------------------------------- Jewel, RN with [facility] called to let provider know that patients leg wounds have healed. Patients right heel has a stable eschar and she would like verbal orders to treat with betadine. Jewel would like an order to be placed for compression wraps or socks. Requesting and OT and PT evaluation added to resumption of care. 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 Please advise.

## 2024-01-30 NOTE — Addendum Note (Signed)
 Addended by: GASPER NANCYANN BRAVO on: 01/30/2024 04:50 PM   Modules accepted: Orders

## 2024-01-30 NOTE — Telephone Encounter (Signed)
 That's all fine. Have placed referral order to wound clinic

## 2024-01-31 NOTE — Telephone Encounter (Signed)
 Advised

## 2024-02-04 ENCOUNTER — Telehealth: Payer: Self-pay | Admitting: Family Medicine

## 2024-02-04 DIAGNOSIS — J449 Chronic obstructive pulmonary disease, unspecified: Secondary | ICD-10-CM | POA: Diagnosis not present

## 2024-02-04 DIAGNOSIS — Z96612 Presence of left artificial shoulder joint: Secondary | ICD-10-CM | POA: Diagnosis not present

## 2024-02-04 DIAGNOSIS — Z9181 History of falling: Secondary | ICD-10-CM | POA: Diagnosis not present

## 2024-02-04 DIAGNOSIS — K219 Gastro-esophageal reflux disease without esophagitis: Secondary | ICD-10-CM | POA: Diagnosis not present

## 2024-02-04 DIAGNOSIS — I25119 Atherosclerotic heart disease of native coronary artery with unspecified angina pectoris: Secondary | ICD-10-CM | POA: Diagnosis not present

## 2024-02-04 DIAGNOSIS — Z87891 Personal history of nicotine dependence: Secondary | ICD-10-CM | POA: Diagnosis not present

## 2024-02-04 DIAGNOSIS — D631 Anemia in chronic kidney disease: Secondary | ICD-10-CM | POA: Diagnosis not present

## 2024-02-04 DIAGNOSIS — E7800 Pure hypercholesterolemia, unspecified: Secondary | ICD-10-CM | POA: Diagnosis not present

## 2024-02-04 DIAGNOSIS — M48062 Spinal stenosis, lumbar region with neurogenic claudication: Secondary | ICD-10-CM | POA: Diagnosis not present

## 2024-02-04 DIAGNOSIS — I130 Hypertensive heart and chronic kidney disease with heart failure and stage 1 through stage 4 chronic kidney disease, or unspecified chronic kidney disease: Secondary | ICD-10-CM | POA: Diagnosis not present

## 2024-02-04 DIAGNOSIS — Z96641 Presence of right artificial hip joint: Secondary | ICD-10-CM | POA: Diagnosis not present

## 2024-02-04 DIAGNOSIS — E1122 Type 2 diabetes mellitus with diabetic chronic kidney disease: Secondary | ICD-10-CM | POA: Diagnosis not present

## 2024-02-04 DIAGNOSIS — N183 Chronic kidney disease, stage 3 unspecified: Secondary | ICD-10-CM | POA: Diagnosis not present

## 2024-02-04 DIAGNOSIS — Z981 Arthrodesis status: Secondary | ICD-10-CM | POA: Diagnosis not present

## 2024-02-04 DIAGNOSIS — I5032 Chronic diastolic (congestive) heart failure: Secondary | ICD-10-CM | POA: Diagnosis not present

## 2024-02-04 DIAGNOSIS — K760 Fatty (change of) liver, not elsewhere classified: Secondary | ICD-10-CM | POA: Diagnosis not present

## 2024-02-04 DIAGNOSIS — Z79891 Long term (current) use of opiate analgesic: Secondary | ICD-10-CM | POA: Diagnosis not present

## 2024-02-04 DIAGNOSIS — R339 Retention of urine, unspecified: Secondary | ICD-10-CM | POA: Diagnosis not present

## 2024-02-04 DIAGNOSIS — G4733 Obstructive sleep apnea (adult) (pediatric): Secondary | ICD-10-CM | POA: Diagnosis not present

## 2024-02-04 DIAGNOSIS — Z85828 Personal history of other malignant neoplasm of skin: Secondary | ICD-10-CM | POA: Diagnosis not present

## 2024-02-04 DIAGNOSIS — Z7984 Long term (current) use of oral hypoglycemic drugs: Secondary | ICD-10-CM | POA: Diagnosis not present

## 2024-02-04 DIAGNOSIS — Z951 Presence of aortocoronary bypass graft: Secondary | ICD-10-CM | POA: Diagnosis not present

## 2024-02-04 DIAGNOSIS — Z96611 Presence of right artificial shoulder joint: Secondary | ICD-10-CM | POA: Diagnosis not present

## 2024-02-04 NOTE — Telephone Encounter (Unsigned)
 Copied from CRM [id number]. Topic: Clinical - Medical Advice >> Feb 04, 2024  2:19 PM Tiffini S wrote: Reason for CRM:  Caller/Agency: [facility]el with [facility] Number: [phone] Service Requested: Skilled Nursing for wound care, need to know if wrap both of his legs with unna boots once a week, wants to try silicone bordered gauge on Sacrum Please call to update.

## 2024-02-04 NOTE — Telephone Encounter (Signed)
 Can they change unna boot twice a week. Ok for silicone bordered gauge on sacrum

## 2024-02-04 NOTE — Telephone Encounter (Signed)
 Spoke with Jewels and made aware of provider recommendation, Jewels verbalized understanding.

## 2024-02-05 ENCOUNTER — Encounter: Payer: Self-pay | Admitting: Family Medicine

## 2024-02-05 ENCOUNTER — Ambulatory Visit
Admission: RE | Admit: 2024-02-05 | Discharge: 2024-02-05 | Disposition: A | Discharge status: Home / Self Care | Attending: Family Medicine | Admitting: Family Medicine

## 2024-02-05 ENCOUNTER — Ambulatory Visit
Admission: RE | Admit: 2024-02-05 | Discharge: 2024-02-05 | Disposition: A | Discharge status: Home / Self Care | Source: Ambulatory Visit | Attending: Family Medicine | Admitting: Family Medicine

## 2024-02-05 ENCOUNTER — Ambulatory Visit: Admitting: Family Medicine

## 2024-02-05 VITALS — BP 139/67 | HR 73 | Temp 97.5°F | Ht 61.0 in

## 2024-02-05 DIAGNOSIS — I509 Heart failure, unspecified: Secondary | ICD-10-CM | POA: Diagnosis not present

## 2024-02-05 DIAGNOSIS — J690 Pneumonitis due to inhalation of food and vomit: Secondary | ICD-10-CM

## 2024-02-05 DIAGNOSIS — R319 Hematuria, unspecified: Secondary | ICD-10-CM | POA: Diagnosis not present

## 2024-02-05 DIAGNOSIS — N184 Chronic kidney disease, stage 4 (severe): Secondary | ICD-10-CM

## 2024-02-05 DIAGNOSIS — I129 Hypertensive chronic kidney disease with stage 1 through stage 4 chronic kidney disease, or unspecified chronic kidney disease: Secondary | ICD-10-CM | POA: Diagnosis not present

## 2024-02-05 DIAGNOSIS — N179 Acute kidney failure, unspecified: Secondary | ICD-10-CM

## 2024-02-05 DIAGNOSIS — Z96611 Presence of right artificial shoulder joint: Secondary | ICD-10-CM | POA: Diagnosis not present

## 2024-02-05 DIAGNOSIS — L89153 Pressure ulcer of sacral region, stage 3: Secondary | ICD-10-CM | POA: Diagnosis not present

## 2024-02-05 DIAGNOSIS — R809 Proteinuria, unspecified: Secondary | ICD-10-CM | POA: Diagnosis not present

## 2024-02-05 DIAGNOSIS — E876 Hypokalemia: Secondary | ICD-10-CM | POA: Diagnosis not present

## 2024-02-05 DIAGNOSIS — E1121 Type 2 diabetes mellitus with diabetic nephropathy: Secondary | ICD-10-CM | POA: Diagnosis not present

## 2024-02-05 DIAGNOSIS — J984 Other disorders of lung: Secondary | ICD-10-CM | POA: Diagnosis not present

## 2024-02-05 DIAGNOSIS — Z96612 Presence of left artificial shoulder joint: Secondary | ICD-10-CM | POA: Diagnosis not present

## 2024-02-05 DIAGNOSIS — E785 Hyperlipidemia, unspecified: Secondary | ICD-10-CM | POA: Diagnosis not present

## 2024-02-05 NOTE — Progress Notes (Signed)
 [facility] Follow Up Visit   Patient Name: Mark Chelf., male   Patient DOB: Dec 07, 1934 Date of Service: 02/05/2024  Patient MRN: [id number] Provider Creating Note: Woodward Brought, MD  [phone] Primary Care Physician: Gasper Nancyann BRAVO, MD   [address] [place] [address] Additional Physicians/ Providers:   Impression/Recommendations   Mr. [name] is a 89 y.o. male with diabetes mellitus type II, hypertension, congestive heart failure, fatty liver, COPD, hypothyroidism, sleep apnea and coronary artery disease status post CABG who presents as a follow up patient for evaluation of chronic kidney disease stage IV. Creatinine 2.48, GFR of 24. PCR 986mg .    Chronic Kidney Disease stage IV: with proteinuria: with history of hypertension, diabetes mellitus type II and NSAIDs (meloxicam  and ibuprofen) - Restart empagliflozin   - Holding losartan  - Continue spironolactone 25mg  daily - Avoid nonsteroidal anti-inflammatory agents.    Hypertension with chronic kidney disease: history of chronic congestive heart failure: 120/70. Hypotensive during encounter. However with 1+ peripheral edema. Current regimen of alfuzosin , losartan , and furosemide .  - change furosemide  to PRN. - continue spironolactone, furosemide  and alfuzosin  - home blood pressure monitoring   Diabetes mellitus type II with chronic kidney disease: noninsulin dependent. Off metformin . - Continue empagliflozin  - not currently on an GLP-1 agent   Anemia with chronic kidney disease: with thrombocytopenia: hemoglobin 9.7    Patient Active Problem List  Diagnosis  . Congestive heart failure (HCC)  . Hyperlipidemia  . Hematuria  . Proteinuria  . Chronic kidney disease, Stage IV (severe) (HCC)  . Hypertensive chronic kidney disease, benign, with chronic kidney disease stage I through stage IV, or unspecified  . Hypokalemia    Orders Placed This Encounter  . Protein,  Total, Random Urine w/Creatinine (Protein/Creat Ratio)  . Urinalysis  . Renal Function Panel       Return in about 4 weeks (around 03/04/2024).  Chief Complaint   Chief Complaint  Patient presents with  . Follow-up    History of Present Illness   Mr. [name]. presents for follow up. Patient presents with his daughter who assists with history taking.   Patient was admitted to 7/19 to 7/22 for pneumonia and acute kidney injury. Creatinine peaked at 5.49 and then trended down to 2.29 on discharge.   Patient was asked to hold his furosemide  and empagliflozin . He is starting accumulate with more fluid.   Patient states that his urinary urgency and incontinence has improved off the furosemide .   Patient was asked to restart losartan  at a lower dose. However patient has not done so.    Medications   Current Outpatient Medications:  .  alfuzosin  (UROXATRAL ) 10 MG 24 hr tablet, Take 1 tablet (10 mg total) by mouth daily with breakfast. Take in place of doxazosin , Disp: , Rfl:  .  ASPIRIN  81 PO, Take 81 mg by mouth Every other day, Disp: , Rfl:  .  Empagliflozin  (Jardiance ) 25 MG tablet, Take 25 mg by mouth 1 (one) time each day, Disp: , Rfl:  .  ergocalciferol  1.25 MG (50000 UT) capsule, Take 50,000 Units by mouth every 7 (seven) days., Disp: , Rfl:  .  fluticasone  (FLONASE ) 50 MCG/ACT nasal spray, Administer 2 sprays into affected nostril(s) in the morning., Disp: , Rfl:  .  furosemide  (LASIX ) 40 MG tablet, Take 40 mg by mouth 1 (one) time each day, Disp: , Rfl:  .  gabapentin  (NEURONTIN ) 300 MG capsule, Take 300  mg by mouth in the morning and 300 mg at noon and 300 mg in the evening., Disp: , Rfl:  .  glimepiride  (AMARYL ) 2 MG tablet, Take 2 mg by mouth 1 (one) time each day, Disp: , Rfl:  .  HYDROcodone -acetaminophen  (NORCO) 7.5-325 MG per tablet, 1/2-1 po tid prn, Disp: , Rfl:  .  ibuprofen (ADVIL,MOTRIN) 200 MG tablet, Take 200 mg by mouth every 6 (six) hours as needed.,  Disp: , Rfl:  .  levothyroxine  (SYNTHROID , LEVOTHROID) 25 MCG tablet, Take 25 mcg by mouth 1 (one) time each day, Disp: , Rfl:  .  loratadine  (CLARITIN ) 10 MG tablet, Take 10 mg by mouth 1 (one) time each day, Disp: , Rfl:  .  metoprolol  tartrate 25 MG tablet, Take 25 mg by mouth in the morning and 25 mg in the evening., Disp: , Rfl:  .  Multiple Vitamins-Minerals (Multi Vitamin/Minerals) tablet, Take 1 tablet by mouth 1 (one) time each day, Disp: , Rfl:  .  omeprazole  (PriLOSEC) 40 MG DR capsule, , Disp: , Rfl:  .  oxybutynin  (DITROPAN ) 5 MG tablet, Take 5 mg by mouth in the morning and 5 mg in the evening and 5 mg before bedtime., Disp: , Rfl:  .  simvastatin  (ZOCOR ) 40 MG tablet, Take 40 mg by mouth at bed time, Disp: , Rfl:  .  spironolactone (Aldactone) 25 MG tablet, Take 1 tablet (25 mg total) by mouth 1 (one) time each day, Disp: 30 tablet, Rfl: 11 .  traZODone  (DESYREL ) 100 MG tablet, Take 0.5-1 tablets by mouth nightly as needed, Disp: , Rfl:    Allergies Meperidine, Propoxyphene, Codeine, and Oxycodone   History Past Medical History:  Diagnosis Date  . Cancer of skin   . Chronic kidney disease stage 3B (HCC)   . Chronic obstructive pulmonary disease (HCC)   . Congestive heart failure (HCC)   . Coronary atherosclerosis of unspecified type of vessel, native or graft   . Fatty liver   . Gastroesophageal reflux disease   . Hematuria   . Hyperlipidemia   . Hypertensive chronic kidney disease, benign, with chronic kidney disease stage I through stage IV, or unspecified   . Hypokalemia   . Hypothyroidism   . Neuropathy   . Obstructive sleep apnea   . Osteoarthritis   . Proteinuria     Past Surgical History:  Procedure Laterality Date  . ANKLE FUSION  2017  . BACK SURGERY    . CHOLECYSTECTOMY    . CORONARY ARTERY BYPASS GRAFT    . HIP ARTHROPLASTY Right 2012   Dr. Mardee  . TOTAL KNEE ARTHROPLASTY Bilateral   . TOTAL SHOULDER ARTHROPLASTY Bilateral    Family History   Problem Relation Age of Onset  . Dementia Mother   . Heart disease Father    Social History   Tobacco Use  . Smoking status: Never  . Smokeless tobacco: Never  Substance Use Topics  . Alcohol use: Never     Physical Exam  Vitals BP 120/70 (BP Location: Right upper arm, Patient Position: Sitting)   Pulse 76   Temp 98.2 F   Wt 228 lb (103 kg)   SpO2 95%   BMI 43.08 kg/m   Vitals reviewed. Constitutional: He is oriented to person, place, and time. He appears well-developed.  HEENT:  Head: Normocephalic and atraumatic. Mouth/Throat: Oropharynx is clear and moist.  Eyes: Pupils are equal, round, and reactive to light.  Neck: Neck supple.  Cardiovascular:  Normal rate and regular  rhythm.           Pulmonary/Chest: Effort normal and breath sounds normal.  Abdominal: Soft.  Neurological: He is alert and oriented to person, place, and time.  Skin: Skin is warm and dry.     Laboratory Studies  Chemistry  Lab Units 12/25/23 1507 11/27/23 1125 10/23/23 1119  SODIUM mmol/L 135 139 140  POTASSIUM mmol/L 4.4 4.0 4.3  CHLORIDE mmol/L 104 105 106  CO2 mmol/L 23 22 26.5  ANION GAP   --   --  11.8  MAGNESIUM mg/dL  --  2.2  --   CALCIUM mg/dL 9.3 9.1 9.2  PHOSPHORUS mg/dL 4.0 4.4*  --   PTH pg/mL  --  98*  --   GLUCOSE mg/dL 774* 834* 825*  ALBUMIN g/dL 4.0 3.5*  3.6  --   BUN mg/dL 44* 45* 50*  CREATININE mg/dL 7.51* 7.38* 2.3*        No lab exists for component: IRON SATURATION, TRANSSATPER  CBC  Lab Units 12/25/23 1507 11/27/23 1125  WBC AUTO Thousand/uL  --  6.4  HEMOGLOBIN g/dL  --  9.8*  HEMOGLOBIN URINE  NEGATIVE  --   HEMATOCRIT %  --  30.3*  MCV fL  --  96.2  PLATELETS AUTO Thousand/uL  --  102*    Urine  Lab Units 12/25/23 1507 11/27/23 1125 11/27/23 1029  COLOR U  YELLOW  --   --   COLOR UA   --   --  Yellow  CLARITY UA   --   --  Clear  KETONES U MG/DL  NEGATIVE  --   --   KETONES UA   --   --  Negative  PH UA   --   --  5.5   UROBILINOGEN UA   --   --  0.2  PROT/CREAT RATIO UR mg/g creat  --  0.986*  986*  --         No lab exists for component: CYCLOSPORITR     Woodward Brought, MD  [employer], [place]

## 2024-02-06 ENCOUNTER — Other Ambulatory Visit: Payer: Self-pay

## 2024-02-06 ENCOUNTER — Telehealth: Payer: Self-pay

## 2024-02-06 DIAGNOSIS — Z9181 History of falling: Secondary | ICD-10-CM | POA: Diagnosis not present

## 2024-02-06 DIAGNOSIS — Z96612 Presence of left artificial shoulder joint: Secondary | ICD-10-CM | POA: Diagnosis not present

## 2024-02-06 DIAGNOSIS — I5032 Chronic diastolic (congestive) heart failure: Secondary | ICD-10-CM | POA: Diagnosis not present

## 2024-02-06 DIAGNOSIS — K219 Gastro-esophageal reflux disease without esophagitis: Secondary | ICD-10-CM | POA: Diagnosis not present

## 2024-02-06 DIAGNOSIS — Z96611 Presence of right artificial shoulder joint: Secondary | ICD-10-CM | POA: Diagnosis not present

## 2024-02-06 DIAGNOSIS — N183 Chronic kidney disease, stage 3 unspecified: Secondary | ICD-10-CM | POA: Diagnosis not present

## 2024-02-06 DIAGNOSIS — D631 Anemia in chronic kidney disease: Secondary | ICD-10-CM | POA: Diagnosis not present

## 2024-02-06 DIAGNOSIS — Z85828 Personal history of other malignant neoplasm of skin: Secondary | ICD-10-CM | POA: Diagnosis not present

## 2024-02-06 DIAGNOSIS — R339 Retention of urine, unspecified: Secondary | ICD-10-CM | POA: Diagnosis not present

## 2024-02-06 DIAGNOSIS — I25119 Atherosclerotic heart disease of native coronary artery with unspecified angina pectoris: Secondary | ICD-10-CM | POA: Diagnosis not present

## 2024-02-06 DIAGNOSIS — K760 Fatty (change of) liver, not elsewhere classified: Secondary | ICD-10-CM | POA: Diagnosis not present

## 2024-02-06 DIAGNOSIS — Z7984 Long term (current) use of oral hypoglycemic drugs: Secondary | ICD-10-CM | POA: Diagnosis not present

## 2024-02-06 DIAGNOSIS — M48062 Spinal stenosis, lumbar region with neurogenic claudication: Secondary | ICD-10-CM | POA: Diagnosis not present

## 2024-02-06 DIAGNOSIS — Z951 Presence of aortocoronary bypass graft: Secondary | ICD-10-CM | POA: Diagnosis not present

## 2024-02-06 DIAGNOSIS — G4733 Obstructive sleep apnea (adult) (pediatric): Secondary | ICD-10-CM | POA: Diagnosis not present

## 2024-02-06 DIAGNOSIS — J449 Chronic obstructive pulmonary disease, unspecified: Secondary | ICD-10-CM | POA: Diagnosis not present

## 2024-02-06 DIAGNOSIS — E1122 Type 2 diabetes mellitus with diabetic chronic kidney disease: Secondary | ICD-10-CM | POA: Diagnosis not present

## 2024-02-06 DIAGNOSIS — Z96641 Presence of right artificial hip joint: Secondary | ICD-10-CM | POA: Diagnosis not present

## 2024-02-06 DIAGNOSIS — I130 Hypertensive heart and chronic kidney disease with heart failure and stage 1 through stage 4 chronic kidney disease, or unspecified chronic kidney disease: Secondary | ICD-10-CM | POA: Diagnosis not present

## 2024-02-06 DIAGNOSIS — E7800 Pure hypercholesterolemia, unspecified: Secondary | ICD-10-CM | POA: Diagnosis not present

## 2024-02-06 DIAGNOSIS — Z79891 Long term (current) use of opiate analgesic: Secondary | ICD-10-CM | POA: Diagnosis not present

## 2024-02-06 DIAGNOSIS — Z87891 Personal history of nicotine dependence: Secondary | ICD-10-CM | POA: Diagnosis not present

## 2024-02-06 DIAGNOSIS — Z981 Arthrodesis status: Secondary | ICD-10-CM | POA: Diagnosis not present

## 2024-02-06 NOTE — Telephone Encounter (Signed)
 Copied from CRM [id number]. Topic: Clinical - Home Health Verbal Orders >> Feb 06, 2024  3:24 PM Santiya F wrote: Caller/Agency: [facility]- Physical Therapist [facility] Callback Number: [phone] Service Requested: Physical Therapy, Re certification Frequency: 2 week 2 , 1 week 3 , 1 every 2 week 4 Any new concerns about the patient? Not applicable

## 2024-02-06 NOTE — Patient Instructions (Signed)
 Visit Information  Thank you for taking time to visit with me today. Please don't hesitate to contact me if I can be of assistance to you before our next scheduled telephone appointment.  Our next appointment is by telephone on 02/13/24 at 0930  Following is a copy of your care plan:   Goals Addressed             This Visit's Progress    VBCI Transitions of Care (TOC) Care Plan   On track    Problems:  Recent Hospitalization for treatment of CKD Stage 4 and Septic Shock secondary to pneumonia Diet/Nutrition/Food Resources Need to pick up Ensure Plus High Protein, Equipment/DME barrier Hospital bed delivery scheduled for today , and Knowledge Deficit Related to management of wound care needs  Goal:  Over the next 30 days, the patient will not experience hospital readmission  Interventions:    Chronic Kidney Disease Interventions: Assessed the patient and daughter [name]'s  understanding of chronic kidney disease    Evaluation of current treatment plan related to chronic kidney disease self management and patient's adherence to plan as established by provider      Reviewed prescribed diet Heart Healthy Diabetic with high Protein Ensure Plus Reviewed scheduled/upcoming provider appointments including    Discussed plans with patient for ongoing care management follow up and provided patient with direct contact information for care management team    Last practice recorded BP readings:  BP Readings from Last 3 Encounters:  01/28/24 116/62  01/15/24 113/66  12/30/23 (!) 101/54   Most recent eGFR/CrCl:  Lab Results  Component Value Date   EGFR 27 (L) 09/25/2023    No components found for: CRCL  Patient Self Care Activities:  Attend all scheduled provider appointments Call pharmacy for medication refills 3-7 days in advance of running out of medications Call provider office for new concerns or questions  Notify RN Care Manager of [facility] call rescheduling needs Participate in  Transition of Care Program/Attend TOC scheduled calls Take medications as prescribed    Plan:  Telephone follow up appointment with care management team member scheduled for:  August 7. 2025  at  The care management team will reach out to the patient again over the next 5-10 business days.        Patient verbalizes understanding of instructions and care plan provided today and agrees to view in MyChart. Active MyChart status and patient understanding of how to access instructions and care plan via MyChart confirmed with patient.     Telephone follow up appointment with care management team member scheduled for: The patient has been provided with contact information for the care management team and has been advised to call with any health related questions or concerns.   Please call the care guide team at [phone] if you need to cancel or reschedule your appointment.   Please call the [place]  National Suicide Prevention Lifeline: 234-272-0302 or TTY: (737)168-3865 TTY 408-629-1384) to talk to a trained counselor if you are experiencing a Mental Health or Behavioral Health Crisis or need someone to talk to.  Richerd Fish, RN, BSN, CCM [facility], [facility] RN Care Manager Direct Dial: [phone]

## 2024-02-06 NOTE — Transitions of Care (Post Inpatient/ED Visit) (Signed)
 Transition of Care week 2  Visit Note  02/06/2024  Name: Mark Terry. MRN: [id number]          DOB: Oct 16, 1934  Situation: Patient enrolled in [facility] 30-day program. Visit completed with Olam, daughter answered phone listed as preferred then called patient's number by telephone.   Background:   Initial Transition Care Management Follow-up Telephone Call    Past Medical History:  Diagnosis Date   Anemia    Bruises easily    Diabetes mellitus without complication (HCC)    H/O esophagogastroduodenoscopy    H/O hiatal hernia    History of blood transfusion    no reaction noted   History of bronchitis    last time a couple of yrs ago   History of gastric ulcer    History of MRSA infection    Hyperlipidemia    Hypertension    Leg cramps    takes quinine  prn   Melanoma (HCC)    Panic attacks    Pneumonia    hx of last time a couple of yrs ago   PONV (postoperative nausea and vomiting)    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   Diagnosis: Squamous cell Carcinoma Location: Mid superior forehead Pre- operative size: 1cm x 1cm Total stages: 1 Post-Operative Size: 2.3cm x 2.1cm Date of Surgery: 05/01/2019    Assessment: Patient Reported Symptoms: Cognitive Cognitive Status: Able to follow simple commands, Alert and oriented to person, place, and time      Neurological Neurological Review of Symptoms: No symptoms reported Neurological Management Strategies: Activity, Adequate rest, Coping strategies, Medication therapy  HEENT HEENT Symptoms Reported: Not assessed      Cardiovascular Cardiovascular Symptoms Reported: No symptoms reported (swelling in right leg has been this way for years states it's chronic) Is patient checking Blood Pressure at home?: Yes Patient's Recent BP reading at home: 123/81 Cardiovascular Management Strategies: Activity, Adequate rest, Medication therapy Weight: 223 lb (101.2 kg) Cardiovascular Self-Management Outcome: 4  (good) Cardiovascular Comment: Patient states he doesn't get to weigh everyday because he is alone and wants to be careful  Respiratory Respiratory Symptoms Reported: No symptoms reported (Had a chest XR yesterday - awaiting results) Other Respiratory Symptoms: much better Respiratory Management Strategies: Activity, Adequate rest, CPAP Respiratory Self-Management Outcome: 4 (good)  Endocrine Endocrine Symptoms Reported: No symptoms reported Is patient diabetic?: Yes Is patient checking blood sugars at home?: Yes List most recent blood sugar readings, include date and time of day: 131 02/06/24 around 8 am Endocrine Self-Management Outcome: 4 (good)  Gastrointestinal Gastrointestinal Symptoms Reported: No symptoms reported Additional Gastrointestinal Details: take meds if he has gone, likes to have BM every day if possible Gastrointestinal Management Strategies: Medication therapy, Activity, Adequate rest, Coping strategies, Diet modification Gastrointestinal Self-Management Outcome: 4 (good)    Genitourinary Genitourinary Symptoms Reported: No symptoms reported Additional Genitourinary Details: Much better not as much urgency while off of Furosemide  Genitourinary Management Strategies: Medication therapy Genitourinary Self-Management Outcome: 4 (good)  Integumentary Integumentary Symptoms Reported: Wound Additional Integumentary Details: State wound care is with [facility] RN for dressing changes Skin Management Strategies: Dressing changes Skin Self-Management Outcome: 4 (good)  Musculoskeletal Musculoskelatal Symptoms Reviewed: Unsteady gait, Weakness Additional Musculoskeletal Details: better with HH PT and hosptial bed Musculoskeletal Management Strategies: Activity, Adequate rest, Exercise, Medication therapy Musculoskeletal Self-Management Outcome: 4 (good) Musculoskeletal Comment: better Falls in the past year?: No    Psychosocial Psychosocial Symptoms Reported: No symptoms  reported Behavioral Management Strategies: Medication therapy, Activity, Coping strategies  Behavioral Health Self-Management Outcome: 4 (good) Behavioral Health Comment: Improving       Vitals:   02/06/24 1004  BP: 123/81    Medications Reviewed Today     Reviewed by Eilleen Richerd GRADE, RN (Registered Nurse) on 02/06/24 at 1053  Med List Status: <None>   Medication Order Taking? Sig Documenting Provider Last Dose Status Informant  ACCU-CHEK GUIDE test strip [id number] Yes CHECK BLOOD SUGAR ONCE DAILY FOR TYPE 2 DIABETES Gasper Nancyann BRAVO, MD  Active   alfuzosin  (UROXATRAL ) 10 MG 24 hr tablet [id number] Yes Take 1 tablet (10 mg total) by mouth daily with breakfast. Take in place of doxazosin  Gasper Nancyann BRAVO, MD  Active   ascorbic acid  (VITAMIN C ) 500 MG tablet [id number] Yes Take 1 tablet (500 mg total) by mouth 2 (two) times daily. Patel, Sona, MD  Active   aspirin  81 MG tablet [id number] Yes Take 81 mg by mouth daily. [provider]  Active Self           Med Note HALLIE, FELECIA N   Tue Oct 11, 2020 11:09 AM) Reports taking every other day.  DULoxetine  (CYMBALTA ) 30 MG capsule [id number] Yes TAKE 1 CAPSULE BY MOUTH EVERY DAY Gasper Nancyann BRAVO, MD  Active   empagliflozin  (JARDIANCE ) 25 MG TABS tablet [id number] Yes TAKE 1 TABLET BY MOUTH DAILY Fisher, Nancyann BRAVO, MD  Active            Med Note LESLY, RICHERD GRADE Schaumann Feb 06, 2024 10:38 AM) Restarted per Nephrology  feeding supplement (ENSURE PLUS HIGH PROTEIN) LIQD [id number] Yes Take 237 mLs by mouth 2 (two) times daily between meals. Patel, Sona, MD  Active   fluticasone  (FLONASE ) 50 MCG/ACT nasal spray [id number] Yes USE 1 TO 2 SPRAYS IN BOTH  NOSTRILS DAILY Gasper Nancyann BRAVO, MD  Active   furosemide  (LASIX ) 40 MG tablet [id number] Yes TAKE 1 TABLET BY MOUTH DAILY  Patient taking differently: TAKE 1 TABLET BY MOUTH DAILY   Gasper Nancyann BRAVO, MD  Active   gabapentin  (NEURONTIN ) 300 MG capsule [id number] Yes TAKE 1 CAPSULE BY MOUTH 3  TIMES  DAILY Gasper Nancyann BRAVO, MD  Active   glimepiride  (AMARYL ) 1 MG tablet [id number]  TAKE 1 TABLET BY MOUTH DAILY Gasper Nancyann BRAVO, MD  Active   HYDROcodone -acetaminophen  (NORCO) 7.5-325 MG tablet [id number] Yes Take 1 tablet by mouth 2 (two) times daily. [provider]  Active   HYDROcodone -acetaminophen  (NORCO/VICODIN) 5-325 MG tablet [id number]  TAKE 1 TABLET BY MOUTH TWICE DAILY AS NEEDED  Patient not taking: Reported on 02/06/2024   [provider]  Active   levothyroxine  (SYNTHROID ) 50 MCG tablet [id number] Yes TAKE 1 TABLET BY MOUTH DAILY Gasper Nancyann BRAVO, MD  Active   loratadine  (CLARITIN ) 10 MG tablet [id number] Yes Take 10 mg by mouth daily. [provider]  Active Self  metoprolol  tartrate (LOPRESSOR ) 25 MG tablet [id number] Yes TAKE 1 TABLET BY MOUTH TWICE  DAILY Gasper Nancyann BRAVO, MD  Active   Multiple Vitamin (MULTIVITAMIN WITH MINERALS) TABS [id number] Yes Take 1 tablet by mouth daily. [provider]  Active Self  omeprazole  (PRILOSEC) 40 MG capsule [id number] Yes TAKE 1 CAPSULE BY MOUTH DAILY Gasper Nancyann BRAVO, MD  Active   oxybutynin  (DITROPAN ) 5 MG tablet [id number] Yes TAKE 1 TABLET BY MOUTH TWICE  DAILY Gasper Nancyann BRAVO, MD  Active   simvastatin  (ZOCOR ) 40 MG tablet [id number] Yes TAKE 1 TABLET BY MOUTH AT  BEDTIME Gasper Nancyann BRAVO,  MD  Active   traZODone  (DESYREL ) 100 MG tablet [id number] Yes TAKE 1/2 TO 1 TABLET BY MOUTH AT BEDTIME AS NEEDED. FOR SLEEP Gasper Nancyann BRAVO, MD  Active   Vitamin D , Ergocalciferol , (DRISDOL ) 1.25 MG (50000 UNIT) CAPS capsule [id number] Yes Take 50,000 Units by mouth every 7 (seven) days. [provider]  Active   Med List Note Wendelyn Pulling, RN 08/26/18 9147): Medication agreement and Opiod contract signed 08/26/18 MR 10/31/18 UDS 08/26/18 Serum drug screen 05/30/19            Recommendation:   Continue Current Plan of Care Continue with HHPT for physical therapy and HHRN for wound care   Richerd Fish, RN, BSN, CCM [facility], [facility] RN Care Manager Direct Dial: [phone]

## 2024-02-06 NOTE — Telephone Encounter (Signed)
 Called to inform [facility] of approval

## 2024-02-06 NOTE — Telephone Encounter (Signed)
 That's fine

## 2024-02-07 DIAGNOSIS — E7800 Pure hypercholesterolemia, unspecified: Secondary | ICD-10-CM | POA: Diagnosis not present

## 2024-02-07 DIAGNOSIS — S31000D Unspecified open wound of lower back and pelvis without penetration into retroperitoneum, subsequent encounter: Secondary | ICD-10-CM | POA: Diagnosis not present

## 2024-02-07 DIAGNOSIS — Z96641 Presence of right artificial hip joint: Secondary | ICD-10-CM | POA: Diagnosis not present

## 2024-02-07 DIAGNOSIS — I25119 Atherosclerotic heart disease of native coronary artery with unspecified angina pectoris: Secondary | ICD-10-CM | POA: Diagnosis not present

## 2024-02-07 DIAGNOSIS — Z96612 Presence of left artificial shoulder joint: Secondary | ICD-10-CM | POA: Diagnosis not present

## 2024-02-07 DIAGNOSIS — R339 Retention of urine, unspecified: Secondary | ICD-10-CM | POA: Diagnosis not present

## 2024-02-07 DIAGNOSIS — Z981 Arthrodesis status: Secondary | ICD-10-CM | POA: Diagnosis not present

## 2024-02-07 DIAGNOSIS — K219 Gastro-esophageal reflux disease without esophagitis: Secondary | ICD-10-CM | POA: Diagnosis not present

## 2024-02-07 DIAGNOSIS — E1122 Type 2 diabetes mellitus with diabetic chronic kidney disease: Secondary | ICD-10-CM | POA: Diagnosis not present

## 2024-02-07 DIAGNOSIS — Z7984 Long term (current) use of oral hypoglycemic drugs: Secondary | ICD-10-CM | POA: Diagnosis not present

## 2024-02-07 DIAGNOSIS — S81802D Unspecified open wound, left lower leg, subsequent encounter: Secondary | ICD-10-CM | POA: Diagnosis not present

## 2024-02-07 DIAGNOSIS — S80822D Blister (nonthermal), left lower leg, subsequent encounter: Secondary | ICD-10-CM | POA: Diagnosis not present

## 2024-02-07 DIAGNOSIS — Z87891 Personal history of nicotine dependence: Secondary | ICD-10-CM | POA: Diagnosis not present

## 2024-02-07 DIAGNOSIS — I130 Hypertensive heart and chronic kidney disease with heart failure and stage 1 through stage 4 chronic kidney disease, or unspecified chronic kidney disease: Secondary | ICD-10-CM | POA: Diagnosis not present

## 2024-02-07 DIAGNOSIS — N183 Chronic kidney disease, stage 3 unspecified: Secondary | ICD-10-CM | POA: Diagnosis not present

## 2024-02-07 DIAGNOSIS — L728 Other follicular cysts of the skin and subcutaneous tissue: Secondary | ICD-10-CM | POA: Diagnosis not present

## 2024-02-07 DIAGNOSIS — S80821S Blister (nonthermal), right lower leg, sequela: Secondary | ICD-10-CM | POA: Diagnosis not present

## 2024-02-07 DIAGNOSIS — Z96611 Presence of right artificial shoulder joint: Secondary | ICD-10-CM | POA: Diagnosis not present

## 2024-02-07 DIAGNOSIS — J449 Chronic obstructive pulmonary disease, unspecified: Secondary | ICD-10-CM | POA: Diagnosis not present

## 2024-02-07 DIAGNOSIS — D631 Anemia in chronic kidney disease: Secondary | ICD-10-CM | POA: Diagnosis not present

## 2024-02-07 DIAGNOSIS — K760 Fatty (change of) liver, not elsewhere classified: Secondary | ICD-10-CM | POA: Diagnosis not present

## 2024-02-07 DIAGNOSIS — M48062 Spinal stenosis, lumbar region with neurogenic claudication: Secondary | ICD-10-CM | POA: Diagnosis not present

## 2024-02-07 DIAGNOSIS — I5032 Chronic diastolic (congestive) heart failure: Secondary | ICD-10-CM | POA: Diagnosis not present

## 2024-02-07 DIAGNOSIS — Z79891 Long term (current) use of opiate analgesic: Secondary | ICD-10-CM | POA: Diagnosis not present

## 2024-02-07 DIAGNOSIS — G4733 Obstructive sleep apnea (adult) (pediatric): Secondary | ICD-10-CM | POA: Diagnosis not present

## 2024-02-07 DIAGNOSIS — I872 Venous insufficiency (chronic) (peripheral): Secondary | ICD-10-CM | POA: Diagnosis not present

## 2024-02-07 DIAGNOSIS — Z9181 History of falling: Secondary | ICD-10-CM | POA: Diagnosis not present

## 2024-02-07 NOTE — Progress Notes (Signed)
 This encounter was created in error - please disregard.

## 2024-02-11 DIAGNOSIS — N183 Chronic kidney disease, stage 3 unspecified: Secondary | ICD-10-CM | POA: Diagnosis not present

## 2024-02-11 DIAGNOSIS — I25119 Atherosclerotic heart disease of native coronary artery with unspecified angina pectoris: Secondary | ICD-10-CM | POA: Diagnosis not present

## 2024-02-11 DIAGNOSIS — Z96612 Presence of left artificial shoulder joint: Secondary | ICD-10-CM | POA: Diagnosis not present

## 2024-02-11 DIAGNOSIS — J449 Chronic obstructive pulmonary disease, unspecified: Secondary | ICD-10-CM | POA: Diagnosis not present

## 2024-02-11 DIAGNOSIS — G4733 Obstructive sleep apnea (adult) (pediatric): Secondary | ICD-10-CM | POA: Diagnosis not present

## 2024-02-11 DIAGNOSIS — D631 Anemia in chronic kidney disease: Secondary | ICD-10-CM | POA: Diagnosis not present

## 2024-02-11 DIAGNOSIS — K760 Fatty (change of) liver, not elsewhere classified: Secondary | ICD-10-CM | POA: Diagnosis not present

## 2024-02-11 DIAGNOSIS — Z96641 Presence of right artificial hip joint: Secondary | ICD-10-CM | POA: Diagnosis not present

## 2024-02-11 DIAGNOSIS — S80821S Blister (nonthermal), right lower leg, sequela: Secondary | ICD-10-CM | POA: Diagnosis not present

## 2024-02-11 DIAGNOSIS — R339 Retention of urine, unspecified: Secondary | ICD-10-CM | POA: Diagnosis not present

## 2024-02-11 DIAGNOSIS — Z79891 Long term (current) use of opiate analgesic: Secondary | ICD-10-CM | POA: Diagnosis not present

## 2024-02-11 DIAGNOSIS — E7800 Pure hypercholesterolemia, unspecified: Secondary | ICD-10-CM | POA: Diagnosis not present

## 2024-02-11 DIAGNOSIS — Z981 Arthrodesis status: Secondary | ICD-10-CM | POA: Diagnosis not present

## 2024-02-11 DIAGNOSIS — S80822D Blister (nonthermal), left lower leg, subsequent encounter: Secondary | ICD-10-CM | POA: Diagnosis not present

## 2024-02-11 DIAGNOSIS — M48062 Spinal stenosis, lumbar region with neurogenic claudication: Secondary | ICD-10-CM | POA: Diagnosis not present

## 2024-02-11 DIAGNOSIS — I130 Hypertensive heart and chronic kidney disease with heart failure and stage 1 through stage 4 chronic kidney disease, or unspecified chronic kidney disease: Secondary | ICD-10-CM | POA: Diagnosis not present

## 2024-02-11 DIAGNOSIS — E1122 Type 2 diabetes mellitus with diabetic chronic kidney disease: Secondary | ICD-10-CM | POA: Diagnosis not present

## 2024-02-11 DIAGNOSIS — I872 Venous insufficiency (chronic) (peripheral): Secondary | ICD-10-CM | POA: Diagnosis not present

## 2024-02-11 DIAGNOSIS — Z87891 Personal history of nicotine dependence: Secondary | ICD-10-CM | POA: Diagnosis not present

## 2024-02-11 DIAGNOSIS — Z9181 History of falling: Secondary | ICD-10-CM | POA: Diagnosis not present

## 2024-02-11 DIAGNOSIS — I5032 Chronic diastolic (congestive) heart failure: Secondary | ICD-10-CM | POA: Diagnosis not present

## 2024-02-11 DIAGNOSIS — L728 Other follicular cysts of the skin and subcutaneous tissue: Secondary | ICD-10-CM | POA: Diagnosis not present

## 2024-02-11 DIAGNOSIS — K219 Gastro-esophageal reflux disease without esophagitis: Secondary | ICD-10-CM | POA: Diagnosis not present

## 2024-02-11 DIAGNOSIS — Z7984 Long term (current) use of oral hypoglycemic drugs: Secondary | ICD-10-CM | POA: Diagnosis not present

## 2024-02-11 DIAGNOSIS — Z96611 Presence of right artificial shoulder joint: Secondary | ICD-10-CM | POA: Diagnosis not present

## 2024-02-12 DIAGNOSIS — S80821S Blister (nonthermal), right lower leg, sequela: Secondary | ICD-10-CM | POA: Diagnosis not present

## 2024-02-12 DIAGNOSIS — Z79891 Long term (current) use of opiate analgesic: Secondary | ICD-10-CM | POA: Diagnosis not present

## 2024-02-12 DIAGNOSIS — J449 Chronic obstructive pulmonary disease, unspecified: Secondary | ICD-10-CM | POA: Diagnosis not present

## 2024-02-12 DIAGNOSIS — Z96611 Presence of right artificial shoulder joint: Secondary | ICD-10-CM | POA: Diagnosis not present

## 2024-02-12 DIAGNOSIS — Z96641 Presence of right artificial hip joint: Secondary | ICD-10-CM | POA: Diagnosis not present

## 2024-02-12 DIAGNOSIS — Z7984 Long term (current) use of oral hypoglycemic drugs: Secondary | ICD-10-CM | POA: Diagnosis not present

## 2024-02-12 DIAGNOSIS — K760 Fatty (change of) liver, not elsewhere classified: Secondary | ICD-10-CM | POA: Diagnosis not present

## 2024-02-12 DIAGNOSIS — N183 Chronic kidney disease, stage 3 unspecified: Secondary | ICD-10-CM | POA: Diagnosis not present

## 2024-02-12 DIAGNOSIS — K219 Gastro-esophageal reflux disease without esophagitis: Secondary | ICD-10-CM | POA: Diagnosis not present

## 2024-02-12 DIAGNOSIS — Z9181 History of falling: Secondary | ICD-10-CM | POA: Diagnosis not present

## 2024-02-12 DIAGNOSIS — D631 Anemia in chronic kidney disease: Secondary | ICD-10-CM | POA: Diagnosis not present

## 2024-02-12 DIAGNOSIS — G4733 Obstructive sleep apnea (adult) (pediatric): Secondary | ICD-10-CM | POA: Diagnosis not present

## 2024-02-12 DIAGNOSIS — R339 Retention of urine, unspecified: Secondary | ICD-10-CM | POA: Diagnosis not present

## 2024-02-12 DIAGNOSIS — M48062 Spinal stenosis, lumbar region with neurogenic claudication: Secondary | ICD-10-CM | POA: Diagnosis not present

## 2024-02-12 DIAGNOSIS — E1122 Type 2 diabetes mellitus with diabetic chronic kidney disease: Secondary | ICD-10-CM | POA: Diagnosis not present

## 2024-02-12 DIAGNOSIS — E7800 Pure hypercholesterolemia, unspecified: Secondary | ICD-10-CM | POA: Diagnosis not present

## 2024-02-12 DIAGNOSIS — L728 Other follicular cysts of the skin and subcutaneous tissue: Secondary | ICD-10-CM | POA: Diagnosis not present

## 2024-02-12 DIAGNOSIS — I130 Hypertensive heart and chronic kidney disease with heart failure and stage 1 through stage 4 chronic kidney disease, or unspecified chronic kidney disease: Secondary | ICD-10-CM | POA: Diagnosis not present

## 2024-02-12 DIAGNOSIS — S80822D Blister (nonthermal), left lower leg, subsequent encounter: Secondary | ICD-10-CM | POA: Diagnosis not present

## 2024-02-12 DIAGNOSIS — Z87891 Personal history of nicotine dependence: Secondary | ICD-10-CM | POA: Diagnosis not present

## 2024-02-12 DIAGNOSIS — I25119 Atherosclerotic heart disease of native coronary artery with unspecified angina pectoris: Secondary | ICD-10-CM | POA: Diagnosis not present

## 2024-02-12 DIAGNOSIS — Z981 Arthrodesis status: Secondary | ICD-10-CM | POA: Diagnosis not present

## 2024-02-12 DIAGNOSIS — I5032 Chronic diastolic (congestive) heart failure: Secondary | ICD-10-CM | POA: Diagnosis not present

## 2024-02-12 DIAGNOSIS — Z96612 Presence of left artificial shoulder joint: Secondary | ICD-10-CM | POA: Diagnosis not present

## 2024-02-12 DIAGNOSIS — I872 Venous insufficiency (chronic) (peripheral): Secondary | ICD-10-CM | POA: Diagnosis not present

## 2024-02-13 ENCOUNTER — Ambulatory Visit: Payer: Self-pay | Admitting: Family Medicine

## 2024-02-13 ENCOUNTER — Other Ambulatory Visit: Payer: Self-pay

## 2024-02-13 DIAGNOSIS — K219 Gastro-esophageal reflux disease without esophagitis: Secondary | ICD-10-CM | POA: Diagnosis not present

## 2024-02-13 DIAGNOSIS — R339 Retention of urine, unspecified: Secondary | ICD-10-CM | POA: Diagnosis not present

## 2024-02-13 DIAGNOSIS — L728 Other follicular cysts of the skin and subcutaneous tissue: Secondary | ICD-10-CM | POA: Diagnosis not present

## 2024-02-13 DIAGNOSIS — Z87891 Personal history of nicotine dependence: Secondary | ICD-10-CM | POA: Diagnosis not present

## 2024-02-13 DIAGNOSIS — S80821S Blister (nonthermal), right lower leg, sequela: Secondary | ICD-10-CM | POA: Diagnosis not present

## 2024-02-13 DIAGNOSIS — Z79891 Long term (current) use of opiate analgesic: Secondary | ICD-10-CM | POA: Diagnosis not present

## 2024-02-13 DIAGNOSIS — I130 Hypertensive heart and chronic kidney disease with heart failure and stage 1 through stage 4 chronic kidney disease, or unspecified chronic kidney disease: Secondary | ICD-10-CM | POA: Diagnosis not present

## 2024-02-13 DIAGNOSIS — G4733 Obstructive sleep apnea (adult) (pediatric): Secondary | ICD-10-CM | POA: Diagnosis not present

## 2024-02-13 DIAGNOSIS — D631 Anemia in chronic kidney disease: Secondary | ICD-10-CM | POA: Diagnosis not present

## 2024-02-13 DIAGNOSIS — K760 Fatty (change of) liver, not elsewhere classified: Secondary | ICD-10-CM | POA: Diagnosis not present

## 2024-02-13 DIAGNOSIS — I5032 Chronic diastolic (congestive) heart failure: Secondary | ICD-10-CM | POA: Diagnosis not present

## 2024-02-13 DIAGNOSIS — Z981 Arthrodesis status: Secondary | ICD-10-CM | POA: Diagnosis not present

## 2024-02-13 DIAGNOSIS — E1122 Type 2 diabetes mellitus with diabetic chronic kidney disease: Secondary | ICD-10-CM | POA: Diagnosis not present

## 2024-02-13 DIAGNOSIS — Z96641 Presence of right artificial hip joint: Secondary | ICD-10-CM | POA: Diagnosis not present

## 2024-02-13 DIAGNOSIS — S80822D Blister (nonthermal), left lower leg, subsequent encounter: Secondary | ICD-10-CM | POA: Diagnosis not present

## 2024-02-13 DIAGNOSIS — Z96611 Presence of right artificial shoulder joint: Secondary | ICD-10-CM | POA: Diagnosis not present

## 2024-02-13 DIAGNOSIS — N183 Chronic kidney disease, stage 3 unspecified: Secondary | ICD-10-CM | POA: Diagnosis not present

## 2024-02-13 DIAGNOSIS — J449 Chronic obstructive pulmonary disease, unspecified: Secondary | ICD-10-CM | POA: Diagnosis not present

## 2024-02-13 DIAGNOSIS — E7800 Pure hypercholesterolemia, unspecified: Secondary | ICD-10-CM | POA: Diagnosis not present

## 2024-02-13 DIAGNOSIS — I872 Venous insufficiency (chronic) (peripheral): Secondary | ICD-10-CM | POA: Diagnosis not present

## 2024-02-13 DIAGNOSIS — M48062 Spinal stenosis, lumbar region with neurogenic claudication: Secondary | ICD-10-CM | POA: Diagnosis not present

## 2024-02-13 DIAGNOSIS — I25119 Atherosclerotic heart disease of native coronary artery with unspecified angina pectoris: Secondary | ICD-10-CM | POA: Diagnosis not present

## 2024-02-13 DIAGNOSIS — Z9181 History of falling: Secondary | ICD-10-CM | POA: Diagnosis not present

## 2024-02-13 DIAGNOSIS — Z7984 Long term (current) use of oral hypoglycemic drugs: Secondary | ICD-10-CM | POA: Diagnosis not present

## 2024-02-13 DIAGNOSIS — Z96612 Presence of left artificial shoulder joint: Secondary | ICD-10-CM | POA: Diagnosis not present

## 2024-02-13 NOTE — Transitions of Care (Post Inpatient/ED Visit) (Signed)
 Transition of Care week 3  Visit Note  02/13/2024  Name: Mark Terry. MRN: [id number]          DOB: 1934/11/26  Situation: Patient enrolled in [facility] 30-day program. Visit completed with patient by telephone.   Background:   Initial Transition Care Management Follow-up Telephone Call    Past Medical History:  Diagnosis Date   Anemia    Bruises easily    Diabetes mellitus without complication (HCC)    H/O esophagogastroduodenoscopy    H/O hiatal hernia    History of blood transfusion    no reaction noted   History of bronchitis    last time a couple of yrs ago   History of gastric ulcer    History of MRSA infection    Hyperlipidemia    Hypertension    Leg cramps    takes quinine  prn   Melanoma (HCC)    Panic attacks    Pneumonia    hx of last time a couple of yrs ago   PONV (postoperative nausea and vomiting)    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   Diagnosis: Squamous cell Carcinoma Location: Mid superior forehead Pre- operative size: 1cm x 1cm Total stages: 1 Post-Operative Size: 2.3cm x 2.1cm Date of Surgery: 05/01/2019    Assessment: Patient Reported Symptoms: Cognitive Cognitive Status: Able to follow simple commands, Alert and oriented to person, place, and time      Neurological Neurological Review of Symptoms: No symptoms reported Neurological Management Strategies: Activity, Adequate rest, Medication therapy  HEENT HEENT Symptoms Reported: No symptoms reported      Cardiovascular Cardiovascular Symptoms Reported: No symptoms reported Does patient have uncontrolled Hypertension?: Yes Is patient checking Blood Pressure at home?: Yes Patient's Recent BP reading at home: 129/73 Cardiovascular Management Strategies: Activity, Adequate rest, Medication therapy Weight: 218 lb (98.9 kg) Cardiovascular Self-Management Outcome: 4 (good) Cardiovascular Comment: Patient states he doesn't get to weigh everyday because he is alone and wants to be  careful, (has a caregiver to help and encouraged to weigh when caregiver is available)  Respiratory Respiratory Symptoms Reported: No symptoms reported Other Respiratory Symptoms: not as winded, working with therapy 150 feet with 1 rest best Respiratory Management Strategies: Activity, Adequate rest, Breathing techniques, Exercise, Medication therapy Respiratory Self-Management Outcome: 4 (good)  Endocrine Endocrine Symptoms Reported: No symptoms reported Is patient diabetic?: Yes Is patient checking blood sugars at home?: Yes List most recent blood sugar readings, include date and time of day: 143; 02/13/24 around 8 am Endocrine Self-Management Outcome: 4 (good)  Gastrointestinal Gastrointestinal Symptoms Reported: No symptoms reported Additional Gastrointestinal Details: no problems tries no to go 2 days without having BM, takes meds as needed Gastrointestinal Management Strategies: Activity, Adequate rest, Diet modification, Medication therapy Gastrointestinal Self-Management Outcome: 4 (good)    Genitourinary Genitourinary Symptoms Reported: No symptoms reported Genitourinary Management Strategies: Activity, Medication therapy Genitourinary Self-Management Outcome: 4 (good) Genitourinary Comment: no problems  Integumentary Integumentary Symptoms Reported: Wound Additional Integumentary Details: HH nurse is letting wound be open to air and no swelling or drainiage so far Skin Management Strategies: Dressing changes Skin Self-Management Outcome: 4 (good)  Musculoskeletal Musculoskelatal Symptoms Reviewed: Unsteady gait, Weakness Additional Musculoskeletal Details: much better walked 150 feet with therapy yesterday and sat once during the walk Musculoskeletal Management Strategies: Activity, Adequate rest, Exercise, Medication therapy Musculoskeletal Self-Management Outcome: 4 (good) Falls in the past year?: No    Psychosocial Psychosocial Symptoms Reported: Anxiety - if selected  complete GAD Behavioral Management Strategies:  Activity, Adequate rest, Coping strategies Behavioral Health Self-Management Outcome: 4 (good) Behavioral Health Comment: on and off Techniques to Cope with Loss/Stress/Change: Diversional activities (leave the situation that's bothering me) Quality of Family Relationships: supportive Do you feel physically threatened by others?: No   There were no vitals filed for this visit.  Medications Reviewed Today     Reviewed by Eilleen Richerd GRADE, RN (Registered Nurse) on 02/13/24 at 0935  Med List Status: <None>   Medication Order Taking? Sig Documenting Provider Last Dose Status Informant  ACCU-CHEK GUIDE test strip [id number] Yes CHECK BLOOD SUGAR ONCE DAILY FOR TYPE 2 DIABETES Gasper Nancyann BRAVO, MD  Active   alfuzosin  (UROXATRAL ) 10 MG 24 hr tablet [id number] Yes Take 1 tablet (10 mg total) by mouth daily with breakfast. Take in place of doxazosin  Gasper Nancyann BRAVO, MD  Active   ascorbic acid  (VITAMIN C ) 500 MG tablet [id number] Yes Take 1 tablet (500 mg total) by mouth 2 (two) times daily. Patel, Sona, MD  Active   aspirin  81 MG tablet [id number] Yes Take 81 mg by mouth daily. [provider]  Active Self           Med Note HALLIE, FELECIA N   Tue Oct 11, 2020 11:09 AM) Reports taking every other day.  DULoxetine  (CYMBALTA ) 30 MG capsule [id number] Yes TAKE 1 CAPSULE BY MOUTH EVERY DAY Gasper Nancyann BRAVO, MD  Active   empagliflozin  (JARDIANCE ) 25 MG TABS tablet [id number] Yes TAKE 1 TABLET BY MOUTH DAILY Fisher, Nancyann BRAVO, MD  Active            Med Note LESLY, RICHERD GRADE Schaumann Feb 06, 2024 10:38 AM) Restarted per Nephrology  feeding supplement (ENSURE PLUS HIGH PROTEIN) LIQD [id number] Yes Take 237 mLs by mouth 2 (two) times daily between meals. Patel, Sona, MD  Active   fluticasone  (FLONASE ) 50 MCG/ACT nasal spray [id number] Yes USE 1 TO 2 SPRAYS IN BOTH  NOSTRILS DAILY Gasper Nancyann BRAVO, MD  Active   furosemide  (LASIX ) 40 MG tablet [id number]   TAKE 1 TABLET BY MOUTH DAILY  Patient taking differently: TAKE 1 TABLET BY MOUTH DAILY   Gasper Nancyann BRAVO, MD  Active   gabapentin  (NEURONTIN ) 300 MG capsule [id number] Yes TAKE 1 CAPSULE BY MOUTH 3 TIMES  DAILY Gasper Nancyann BRAVO, MD  Active   glimepiride  (AMARYL ) 1 MG tablet [id number] Yes TAKE 1 TABLET BY MOUTH DAILY Gasper Nancyann BRAVO, MD  Active   HYDROcodone -acetaminophen  (NORCO) 7.5-325 MG tablet [id number] Yes Take 1 tablet by mouth 2 (two) times daily. [provider]  Active   HYDROcodone -acetaminophen  (NORCO/VICODIN) 5-325 MG tablet [id number] Yes TAKE 1 TABLET BY MOUTH TWICE DAILY AS NEEDED [provider]  Active   levothyroxine  (SYNTHROID ) 50 MCG tablet [id number] Yes TAKE 1 TABLET BY MOUTH DAILY Gasper Nancyann BRAVO, MD  Active   loratadine  (CLARITIN ) 10 MG tablet [id number] Yes Take 10 mg by mouth daily. [provider]  Active Self  metoprolol  tartrate (LOPRESSOR ) 25 MG tablet [id number] Yes TAKE 1 TABLET BY MOUTH TWICE  DAILY Gasper Nancyann BRAVO, MD  Active   Multiple Vitamin (MULTIVITAMIN WITH MINERALS) TABS [id number] Yes Take 1 tablet by mouth daily. [provider]  Active Self  omeprazole  (PRILOSEC) 40 MG capsule [id number] Yes TAKE 1 CAPSULE BY MOUTH DAILY Gasper Nancyann BRAVO, MD  Active   oxybutynin  (DITROPAN ) 5 MG tablet [id number] Yes TAKE 1 TABLET BY MOUTH TWICE  DAILY Fisher, Nancyann BRAVO, MD  Active  simvastatin  (ZOCOR ) 40 MG tablet [id number] Yes TAKE 1 TABLET BY MOUTH AT  BEDTIME Gasper Nancyann BRAVO, MD  Active   traZODone  (DESYREL ) 100 MG tablet [id number] Yes TAKE 1/2 TO 1 TABLET BY MOUTH AT BEDTIME AS NEEDED. FOR SLEEP Gasper Nancyann BRAVO, MD  Active   Vitamin D , Ergocalciferol , (DRISDOL ) 1.25 MG (50000 UNIT) CAPS capsule [id number] Yes Take 50,000 Units by mouth every 7 (seven) days. [provider]  Active   Med List Note Wendelyn Pulling, RN 08/26/18 [id number]): Medication agreement and Opiod contract signed 08/26/18 MR 10/31/18 UDS 08/26/18 Serum drug screen  05/30/19            Recommendation:   Continue Current Plan of Care    Richerd Fish, RN, BSN, CCM [place]  [facility], [facility] RN Care Manager Direct Dial: [phone]

## 2024-02-13 NOTE — Patient Instructions (Signed)
 Visit Information  Thank you for taking time to visit with me today. Please don't hesitate to contact me if I can be of assistance to you before our next scheduled telephone appointment.  Our next appointment is by telephone on August 14 at 2:00 PM  Following is a copy of your care plan:   Goals Addressed             This Visit's Progress    VBCI Transitions of Care (TOC) Care Plan   Improving    Problems:  Recent Hospitalization for treatment of CKD Stage 4 and Septic Shock secondary to pneumonia Diet/Nutrition/Food Resources 8/7 drinking Ensure Plus High Protein as needed, Equipment/DME barrier Hospital bed delivery scheduled for today , and Knowledge Deficit Related to management of wound care needs Wound care is being done by home health, patient states [facility] has the orders and providing the care needed. 02/13/24 Patient states home health nurse left wound open to air and patient is to call [facility] if he has any leakage or changes and verbalized understanding  Goal:  Over the next 30 days, the patient will not experience hospital readmission  Interventions:    Chronic Kidney Disease Interventions: Assessed the patient and daughter [name]'s  understanding of chronic kidney disease    Evaluation of current treatment plan related to chronic kidney disease self management and patient's adherence to plan as established by provider      Reviewed prescribed diet Heart Healthy Diabetic with high Protein Ensure Plus Reviewed scheduled/upcoming provider appointments including    Discussed plans with patient for ongoing care management follow up and provided patient with direct contact information for care management team    Last practice recorded BP readings:  BP Readings from Last 3 Encounters:  02/06/24 123/81  02/05/24 139/67  01/29/24 (!) 105/56   Most recent eGFR/CrCl:  Lab Results  Component Value Date   EGFR 27 (L) 09/25/2023    No components found for: CRCL  Patient  Self Care Activities:  Attend all scheduled provider appointments Call pharmacy for medication refills 3-7 days in advance of running out of medications Call provider office for new concerns or questions  Notify RN Care Manager of TOC call rescheduling needs Participate in Transition of Care Program/Attend TOC scheduled calls Take medications as prescribed    Plan:  Telephone follow up appointment with care management team member scheduled for:    The care management team will reach out to the patient again over the next 5-10 business days. Continue to follow up on progress with HH and with TOC program for following of complex disease needs patient agrees to follow up plan        Patient verbalizes understanding of instructions and care plan provided today and agrees to view in MyChart. Active MyChart status and patient understanding of how to access instructions and care plan via MyChart confirmed with patient.    States daughter keeps [employer] keeps up with scheduled appointments and lets him know what to do.  Telephone follow up appointment with care management team member scheduled for: The patient has been provided with contact information for the care management team and has been advised to call with any health related questions or concerns.  The care management team will reach out to the patient again over the next 5-10 business days.   Please call the care guide team at [phone] if you need to cancel or reschedule your appointment.   Please call the [place]  National Suicide Prevention  Lifeline: (501)856-2811 or TTY: (757)674-1249 TTY 217-630-1200) to talk to a trained counselor call 1-800-273-TALK (toll free, 24 hour hotline) if you are experiencing a Mental Health or Behavioral Health Crisis or need someone to talk to. Richerd Fish, RN, BSN, CCM [facility], [facility] RN Care Manager Direct Dial: [phone]

## 2024-02-14 DIAGNOSIS — N183 Chronic kidney disease, stage 3 unspecified: Secondary | ICD-10-CM | POA: Diagnosis not present

## 2024-02-14 DIAGNOSIS — Z96641 Presence of right artificial hip joint: Secondary | ICD-10-CM | POA: Diagnosis not present

## 2024-02-14 DIAGNOSIS — D631 Anemia in chronic kidney disease: Secondary | ICD-10-CM | POA: Diagnosis not present

## 2024-02-14 DIAGNOSIS — K760 Fatty (change of) liver, not elsewhere classified: Secondary | ICD-10-CM | POA: Diagnosis not present

## 2024-02-14 DIAGNOSIS — S80821S Blister (nonthermal), right lower leg, sequela: Secondary | ICD-10-CM | POA: Diagnosis not present

## 2024-02-14 DIAGNOSIS — M48062 Spinal stenosis, lumbar region with neurogenic claudication: Secondary | ICD-10-CM | POA: Diagnosis not present

## 2024-02-14 DIAGNOSIS — Z7984 Long term (current) use of oral hypoglycemic drugs: Secondary | ICD-10-CM | POA: Diagnosis not present

## 2024-02-14 DIAGNOSIS — E7800 Pure hypercholesterolemia, unspecified: Secondary | ICD-10-CM | POA: Diagnosis not present

## 2024-02-14 DIAGNOSIS — L728 Other follicular cysts of the skin and subcutaneous tissue: Secondary | ICD-10-CM | POA: Diagnosis not present

## 2024-02-14 DIAGNOSIS — Z96611 Presence of right artificial shoulder joint: Secondary | ICD-10-CM | POA: Diagnosis not present

## 2024-02-14 DIAGNOSIS — Z79891 Long term (current) use of opiate analgesic: Secondary | ICD-10-CM | POA: Diagnosis not present

## 2024-02-14 DIAGNOSIS — I872 Venous insufficiency (chronic) (peripheral): Secondary | ICD-10-CM | POA: Diagnosis not present

## 2024-02-14 DIAGNOSIS — Z981 Arthrodesis status: Secondary | ICD-10-CM | POA: Diagnosis not present

## 2024-02-14 DIAGNOSIS — Z87891 Personal history of nicotine dependence: Secondary | ICD-10-CM | POA: Diagnosis not present

## 2024-02-14 DIAGNOSIS — K219 Gastro-esophageal reflux disease without esophagitis: Secondary | ICD-10-CM | POA: Diagnosis not present

## 2024-02-14 DIAGNOSIS — I130 Hypertensive heart and chronic kidney disease with heart failure and stage 1 through stage 4 chronic kidney disease, or unspecified chronic kidney disease: Secondary | ICD-10-CM | POA: Diagnosis not present

## 2024-02-14 DIAGNOSIS — E1122 Type 2 diabetes mellitus with diabetic chronic kidney disease: Secondary | ICD-10-CM | POA: Diagnosis not present

## 2024-02-14 DIAGNOSIS — G4733 Obstructive sleep apnea (adult) (pediatric): Secondary | ICD-10-CM | POA: Diagnosis not present

## 2024-02-14 DIAGNOSIS — I5032 Chronic diastolic (congestive) heart failure: Secondary | ICD-10-CM | POA: Diagnosis not present

## 2024-02-14 DIAGNOSIS — S80822D Blister (nonthermal), left lower leg, subsequent encounter: Secondary | ICD-10-CM | POA: Diagnosis not present

## 2024-02-14 DIAGNOSIS — R339 Retention of urine, unspecified: Secondary | ICD-10-CM | POA: Diagnosis not present

## 2024-02-14 DIAGNOSIS — Z96612 Presence of left artificial shoulder joint: Secondary | ICD-10-CM | POA: Diagnosis not present

## 2024-02-14 DIAGNOSIS — J449 Chronic obstructive pulmonary disease, unspecified: Secondary | ICD-10-CM | POA: Diagnosis not present

## 2024-02-14 DIAGNOSIS — I25119 Atherosclerotic heart disease of native coronary artery with unspecified angina pectoris: Secondary | ICD-10-CM | POA: Diagnosis not present

## 2024-02-14 DIAGNOSIS — Z9181 History of falling: Secondary | ICD-10-CM | POA: Diagnosis not present

## 2024-02-14 NOTE — Progress Notes (Signed)
 Established patient visit   Patient: Mark Terry.   DOB: [birth year]   89 y.o. Male  MRN: [id number] Visit Date: 02/05/2024  Today's healthcare provider: Nancyann Perry, MD   Chief Complaint  Patient presents with   Follow-up    Hospital follow up, admitted 01/25/2024 - 01/28/2024 (3 days). DX hypotension, pneumonia  Hospitalist took patient off of jardiance  and furosemide - needs for you to say if he can continue these or not. GFR was low at hospital    Subjective    Discussed the use of AI scribe software for clinical note transcription with the patient, who gave verbal consent to proceed.  History of Present Illness   Mark Mccarley. is an 89 year old male who presents for a hospital follow-up after being admitted for community-acquired pneumonia, acute renal failure, shock, and pressure injury of the skin.  He was admitted to [facility] from July 19th to July 22nd for community-acquired pneumonia affecting the right lower lung, acute renal failure, shock, and a pressure injury of the skin. During his hospital stay, he required vasopressor therapy in the ICU and was treated with IV antibiotics. He was discharged on oral Augmentin . No symptoms of pneumonia such as cough or fever were present during his hospital stay, and he noted a low body temperature.  His creatinine level was 4.36 at admission and decreased to 2.9 by the time of discharge. He has a follow-up appointment with his nephrologist scheduled for this afternoon. He has been off furosemide  and Jardiance  for a week due to concerns about kidney function, and the swelling in his legs has improved significantly with leg wrapping. He receives home health assistance for leg wrapping and wound care.  His blood pressure was critically low at 60/40 during his hospital stay, and it was challenging to stabilize. His blood pressure was 128 yesterday and 138 today. He feels relieved at not having to take furosemide , as it previously  caused frequent urination and anxiety, impacting his ability to leave the house.  He has an appointment at the wound clinic on August 21st for a persistent cyst that continuously drains. He mentions that previous advice was to live with the cyst, but he is seeking a second opinion.  His blood sugar levels have been stable, with a reading of 98 this morning, which he notes is the lowest he remembers seeing.     Home health has been applying unnaboot at least once a week which have dramatically improved swelling and sores on both legs.   Medications: Outpatient Medications Prior to Visit  Medication Sig   ACCU-CHEK GUIDE test strip CHECK BLOOD SUGAR ONCE DAILY FOR TYPE 2 DIABETES   alfuzosin  (UROXATRAL ) 10 MG 24 hr tablet Take 1 tablet (10 mg total) by mouth daily with breakfast. Take in place of doxazosin    ascorbic acid  (VITAMIN C ) 500 MG tablet Take 1 tablet (500 mg total) by mouth 2 (two) times daily.   aspirin  81 MG tablet Take 81 mg by mouth daily.   DULoxetine  (CYMBALTA ) 30 MG capsule TAKE 1 CAPSULE BY MOUTH EVERY DAY   [Paused] empagliflozin  (JARDIANCE ) 25 MG TABS tablet TAKE 1 TABLET BY MOUTH DAILY   feeding supplement (ENSURE PLUS HIGH PROTEIN) LIQD Take 237 mLs by mouth 2 (two) times daily between meals.   fluticasone  (FLONASE ) 50 MCG/ACT nasal spray USE 1 TO 2 SPRAYS IN BOTH  NOSTRILS DAILY   [Paused] furosemide  (LASIX ) 40 MG tablet TAKE 1 TABLET BY  MOUTH DAILY (Patient taking differently: TAKE 1 TABLET BY MOUTH DAILY)   gabapentin  (NEURONTIN ) 300 MG capsule TAKE 1 CAPSULE BY MOUTH 3 TIMES  DAILY   glimepiride  (AMARYL ) 1 MG tablet TAKE 1 TABLET BY MOUTH DAILY   HYDROcodone -acetaminophen  (NORCO/VICODIN) 5-325 MG tablet TAKE 1 TABLET BY MOUTH TWICE DAILY AS NEEDED   levothyroxine  (SYNTHROID ) 50 MCG tablet TAKE 1 TABLET BY MOUTH DAILY   loratadine  (CLARITIN ) 10 MG tablet Take 10 mg by mouth daily.   metoprolol  tartrate (LOPRESSOR ) 25 MG tablet TAKE 1 TABLET BY MOUTH TWICE  DAILY    Multiple Vitamin (MULTIVITAMIN WITH MINERALS) TABS Take 1 tablet by mouth daily.   omeprazole  (PRILOSEC) 40 MG capsule TAKE 1 CAPSULE BY MOUTH DAILY   oxybutynin  (DITROPAN ) 5 MG tablet TAKE 1 TABLET BY MOUTH TWICE  DAILY   simvastatin  (ZOCOR ) 40 MG tablet TAKE 1 TABLET BY MOUTH AT  BEDTIME   traZODone  (DESYREL ) 100 MG tablet TAKE 1/2 TO 1 TABLET BY MOUTH AT BEDTIME AS NEEDED. FOR SLEEP   Vitamin D , Ergocalciferol , (DRISDOL ) 1.25 MG (50000 UNIT) CAPS capsule Take 50,000 Units by mouth every 7 (seven) days.   No facility-administered medications prior to visit.   Review of Systems     Objective    BP 139/67 (BP Location: Right Arm, Patient Position: Sitting, Cuff Size: Normal)   Pulse 73   Temp (!) 97.5 F (36.4 C) (Oral)   Ht 5' 1 (1.549 m)   SpO2 99%   BMI 41.76 kg/m   Physical Exam   General: Appearance:    Severely obese male in no acute distress  Eyes:    PERRL, conjunctiva/corneas clear, EOM's intact       Lungs:     Clear to auscultation bilaterally, respirations unlabored  Heart:    Normal heart rate. Normal rhythm. No murmurs, rubs, or gallops.    MS:   All extremities are intact.    Neurologic:   Awake, alert, oriented x 3. No apparent focal neurological defect.   Skin:   About 3cm erythema either side of buttocks over sacrum with 1cm open wound, scant serosanguinous discharge       Assessment & Plan       Hospital follow up, pneumonia, right lower lobe Recent hospitalization for right lower lobe pneumonia. Blood pressure stable but slightly elevated post-ICU care. - Order chest X-ray to assess pneumonia resolution and check for lung fluid. - Monitor blood pressure and adjust medications as needed.  Acute kidney injury Acute kidney injury with improving creatinine levels. Off furosemide  and Jardiance  to prevent kidney strain. - Follow up with nephrologist Dr. Roddie for evaluation and blood work. - Do not restart furosemide  or Jardiance  until blood work results  are reviewed. - Consider restarting Jardiance  if kidney function is stable.  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 Edema improved with Unnaboots. Home health assistance arranged for wound care. - Continue leg wrapping to manage edema. - Attend wound clinic appointment on August 21st for evaluation of persistent draining cyst. - Arrange home health assistance for wound care and leg wrapping.  Type 2 diabetes mellitus Type 2 diabetes with good control. Off Jardiance  due to acute kidney injury. - Consider restarting Jardiance  if kidney function is stable and blood work is satisfactory.         Nancyann Perry, MD  [facility] [phone] (phone) [phone] (fax)  [facility]

## 2024-02-14 NOTE — Patient Instructions (Signed)
 Marland Kitchen  Please review the attached list of medications and notify my office if there are any errors.   . Please bring all of your medications to every appointment so we can make sure that our medication list is the same as yours.

## 2024-02-17 DIAGNOSIS — I872 Venous insufficiency (chronic) (peripheral): Secondary | ICD-10-CM | POA: Diagnosis not present

## 2024-02-17 DIAGNOSIS — I130 Hypertensive heart and chronic kidney disease with heart failure and stage 1 through stage 4 chronic kidney disease, or unspecified chronic kidney disease: Secondary | ICD-10-CM | POA: Diagnosis not present

## 2024-02-17 DIAGNOSIS — Z981 Arthrodesis status: Secondary | ICD-10-CM | POA: Diagnosis not present

## 2024-02-17 DIAGNOSIS — M48062 Spinal stenosis, lumbar region with neurogenic claudication: Secondary | ICD-10-CM | POA: Diagnosis not present

## 2024-02-17 DIAGNOSIS — I5032 Chronic diastolic (congestive) heart failure: Secondary | ICD-10-CM | POA: Diagnosis not present

## 2024-02-17 DIAGNOSIS — Z79891 Long term (current) use of opiate analgesic: Secondary | ICD-10-CM | POA: Diagnosis not present

## 2024-02-17 DIAGNOSIS — Z87891 Personal history of nicotine dependence: Secondary | ICD-10-CM | POA: Diagnosis not present

## 2024-02-17 DIAGNOSIS — E1122 Type 2 diabetes mellitus with diabetic chronic kidney disease: Secondary | ICD-10-CM | POA: Diagnosis not present

## 2024-02-17 DIAGNOSIS — I25119 Atherosclerotic heart disease of native coronary artery with unspecified angina pectoris: Secondary | ICD-10-CM | POA: Diagnosis not present

## 2024-02-17 DIAGNOSIS — E7800 Pure hypercholesterolemia, unspecified: Secondary | ICD-10-CM | POA: Diagnosis not present

## 2024-02-17 DIAGNOSIS — Z96641 Presence of right artificial hip joint: Secondary | ICD-10-CM | POA: Diagnosis not present

## 2024-02-17 DIAGNOSIS — D631 Anemia in chronic kidney disease: Secondary | ICD-10-CM | POA: Diagnosis not present

## 2024-02-17 DIAGNOSIS — Z96612 Presence of left artificial shoulder joint: Secondary | ICD-10-CM | POA: Diagnosis not present

## 2024-02-17 DIAGNOSIS — N183 Chronic kidney disease, stage 3 unspecified: Secondary | ICD-10-CM | POA: Diagnosis not present

## 2024-02-17 DIAGNOSIS — K219 Gastro-esophageal reflux disease without esophagitis: Secondary | ICD-10-CM | POA: Diagnosis not present

## 2024-02-17 DIAGNOSIS — Z9181 History of falling: Secondary | ICD-10-CM | POA: Diagnosis not present

## 2024-02-17 DIAGNOSIS — Z96611 Presence of right artificial shoulder joint: Secondary | ICD-10-CM | POA: Diagnosis not present

## 2024-02-17 DIAGNOSIS — G4733 Obstructive sleep apnea (adult) (pediatric): Secondary | ICD-10-CM | POA: Diagnosis not present

## 2024-02-17 DIAGNOSIS — R339 Retention of urine, unspecified: Secondary | ICD-10-CM | POA: Diagnosis not present

## 2024-02-17 DIAGNOSIS — Z7984 Long term (current) use of oral hypoglycemic drugs: Secondary | ICD-10-CM | POA: Diagnosis not present

## 2024-02-17 DIAGNOSIS — S80821S Blister (nonthermal), right lower leg, sequela: Secondary | ICD-10-CM | POA: Diagnosis not present

## 2024-02-17 DIAGNOSIS — L728 Other follicular cysts of the skin and subcutaneous tissue: Secondary | ICD-10-CM | POA: Diagnosis not present

## 2024-02-17 DIAGNOSIS — K760 Fatty (change of) liver, not elsewhere classified: Secondary | ICD-10-CM | POA: Diagnosis not present

## 2024-02-17 DIAGNOSIS — J449 Chronic obstructive pulmonary disease, unspecified: Secondary | ICD-10-CM | POA: Diagnosis not present

## 2024-02-17 DIAGNOSIS — S80822D Blister (nonthermal), left lower leg, subsequent encounter: Secondary | ICD-10-CM | POA: Diagnosis not present

## 2024-02-18 DIAGNOSIS — L728 Other follicular cysts of the skin and subcutaneous tissue: Secondary | ICD-10-CM | POA: Diagnosis not present

## 2024-02-18 DIAGNOSIS — D631 Anemia in chronic kidney disease: Secondary | ICD-10-CM | POA: Diagnosis not present

## 2024-02-18 DIAGNOSIS — Z96612 Presence of left artificial shoulder joint: Secondary | ICD-10-CM | POA: Diagnosis not present

## 2024-02-18 DIAGNOSIS — S80822D Blister (nonthermal), left lower leg, subsequent encounter: Secondary | ICD-10-CM | POA: Diagnosis not present

## 2024-02-18 DIAGNOSIS — K219 Gastro-esophageal reflux disease without esophagitis: Secondary | ICD-10-CM | POA: Diagnosis not present

## 2024-02-18 DIAGNOSIS — E7800 Pure hypercholesterolemia, unspecified: Secondary | ICD-10-CM | POA: Diagnosis not present

## 2024-02-18 DIAGNOSIS — Z96611 Presence of right artificial shoulder joint: Secondary | ICD-10-CM | POA: Diagnosis not present

## 2024-02-18 DIAGNOSIS — I872 Venous insufficiency (chronic) (peripheral): Secondary | ICD-10-CM | POA: Diagnosis not present

## 2024-02-18 DIAGNOSIS — Z87891 Personal history of nicotine dependence: Secondary | ICD-10-CM | POA: Diagnosis not present

## 2024-02-18 DIAGNOSIS — I5032 Chronic diastolic (congestive) heart failure: Secondary | ICD-10-CM | POA: Diagnosis not present

## 2024-02-18 DIAGNOSIS — G4733 Obstructive sleep apnea (adult) (pediatric): Secondary | ICD-10-CM | POA: Diagnosis not present

## 2024-02-18 DIAGNOSIS — E1122 Type 2 diabetes mellitus with diabetic chronic kidney disease: Secondary | ICD-10-CM | POA: Diagnosis not present

## 2024-02-18 DIAGNOSIS — Z981 Arthrodesis status: Secondary | ICD-10-CM | POA: Diagnosis not present

## 2024-02-18 DIAGNOSIS — Z79891 Long term (current) use of opiate analgesic: Secondary | ICD-10-CM | POA: Diagnosis not present

## 2024-02-18 DIAGNOSIS — I130 Hypertensive heart and chronic kidney disease with heart failure and stage 1 through stage 4 chronic kidney disease, or unspecified chronic kidney disease: Secondary | ICD-10-CM | POA: Diagnosis not present

## 2024-02-18 DIAGNOSIS — J449 Chronic obstructive pulmonary disease, unspecified: Secondary | ICD-10-CM | POA: Diagnosis not present

## 2024-02-18 DIAGNOSIS — Z7984 Long term (current) use of oral hypoglycemic drugs: Secondary | ICD-10-CM | POA: Diagnosis not present

## 2024-02-18 DIAGNOSIS — M48062 Spinal stenosis, lumbar region with neurogenic claudication: Secondary | ICD-10-CM | POA: Diagnosis not present

## 2024-02-18 DIAGNOSIS — K760 Fatty (change of) liver, not elsewhere classified: Secondary | ICD-10-CM | POA: Diagnosis not present

## 2024-02-18 DIAGNOSIS — Z96641 Presence of right artificial hip joint: Secondary | ICD-10-CM | POA: Diagnosis not present

## 2024-02-18 DIAGNOSIS — S80821S Blister (nonthermal), right lower leg, sequela: Secondary | ICD-10-CM | POA: Diagnosis not present

## 2024-02-18 DIAGNOSIS — R339 Retention of urine, unspecified: Secondary | ICD-10-CM | POA: Diagnosis not present

## 2024-02-18 DIAGNOSIS — Z9181 History of falling: Secondary | ICD-10-CM | POA: Diagnosis not present

## 2024-02-18 DIAGNOSIS — N183 Chronic kidney disease, stage 3 unspecified: Secondary | ICD-10-CM | POA: Diagnosis not present

## 2024-02-18 DIAGNOSIS — I25119 Atherosclerotic heart disease of native coronary artery with unspecified angina pectoris: Secondary | ICD-10-CM | POA: Diagnosis not present

## 2024-02-19 ENCOUNTER — Other Ambulatory Visit: Payer: Self-pay | Admitting: Family Medicine

## 2024-02-20 ENCOUNTER — Ambulatory Visit: Payer: Self-pay

## 2024-02-20 ENCOUNTER — Other Ambulatory Visit: Payer: Self-pay

## 2024-02-20 ENCOUNTER — Telehealth: Payer: Self-pay

## 2024-02-20 DIAGNOSIS — S80821S Blister (nonthermal), right lower leg, sequela: Secondary | ICD-10-CM | POA: Diagnosis not present

## 2024-02-20 DIAGNOSIS — I872 Venous insufficiency (chronic) (peripheral): Secondary | ICD-10-CM | POA: Diagnosis not present

## 2024-02-20 DIAGNOSIS — I130 Hypertensive heart and chronic kidney disease with heart failure and stage 1 through stage 4 chronic kidney disease, or unspecified chronic kidney disease: Secondary | ICD-10-CM | POA: Diagnosis not present

## 2024-02-20 DIAGNOSIS — K760 Fatty (change of) liver, not elsewhere classified: Secondary | ICD-10-CM | POA: Diagnosis not present

## 2024-02-20 DIAGNOSIS — Z96612 Presence of left artificial shoulder joint: Secondary | ICD-10-CM | POA: Diagnosis not present

## 2024-02-20 DIAGNOSIS — Z9181 History of falling: Secondary | ICD-10-CM | POA: Diagnosis not present

## 2024-02-20 DIAGNOSIS — Z981 Arthrodesis status: Secondary | ICD-10-CM | POA: Diagnosis not present

## 2024-02-20 DIAGNOSIS — N183 Chronic kidney disease, stage 3 unspecified: Secondary | ICD-10-CM | POA: Diagnosis not present

## 2024-02-20 DIAGNOSIS — S80822D Blister (nonthermal), left lower leg, subsequent encounter: Secondary | ICD-10-CM | POA: Diagnosis not present

## 2024-02-20 DIAGNOSIS — L728 Other follicular cysts of the skin and subcutaneous tissue: Secondary | ICD-10-CM | POA: Diagnosis not present

## 2024-02-20 DIAGNOSIS — E7800 Pure hypercholesterolemia, unspecified: Secondary | ICD-10-CM | POA: Diagnosis not present

## 2024-02-20 DIAGNOSIS — Z79891 Long term (current) use of opiate analgesic: Secondary | ICD-10-CM | POA: Diagnosis not present

## 2024-02-20 DIAGNOSIS — M48062 Spinal stenosis, lumbar region with neurogenic claudication: Secondary | ICD-10-CM | POA: Diagnosis not present

## 2024-02-20 DIAGNOSIS — Z7984 Long term (current) use of oral hypoglycemic drugs: Secondary | ICD-10-CM | POA: Diagnosis not present

## 2024-02-20 DIAGNOSIS — Z96611 Presence of right artificial shoulder joint: Secondary | ICD-10-CM | POA: Diagnosis not present

## 2024-02-20 DIAGNOSIS — D631 Anemia in chronic kidney disease: Secondary | ICD-10-CM | POA: Diagnosis not present

## 2024-02-20 DIAGNOSIS — I25119 Atherosclerotic heart disease of native coronary artery with unspecified angina pectoris: Secondary | ICD-10-CM | POA: Diagnosis not present

## 2024-02-20 DIAGNOSIS — E1122 Type 2 diabetes mellitus with diabetic chronic kidney disease: Secondary | ICD-10-CM | POA: Diagnosis not present

## 2024-02-20 DIAGNOSIS — G4733 Obstructive sleep apnea (adult) (pediatric): Secondary | ICD-10-CM | POA: Diagnosis not present

## 2024-02-20 DIAGNOSIS — K219 Gastro-esophageal reflux disease without esophagitis: Secondary | ICD-10-CM | POA: Diagnosis not present

## 2024-02-20 DIAGNOSIS — Z96641 Presence of right artificial hip joint: Secondary | ICD-10-CM | POA: Diagnosis not present

## 2024-02-20 DIAGNOSIS — J449 Chronic obstructive pulmonary disease, unspecified: Secondary | ICD-10-CM | POA: Diagnosis not present

## 2024-02-20 DIAGNOSIS — Z87891 Personal history of nicotine dependence: Secondary | ICD-10-CM | POA: Diagnosis not present

## 2024-02-20 DIAGNOSIS — R339 Retention of urine, unspecified: Secondary | ICD-10-CM | POA: Diagnosis not present

## 2024-02-20 DIAGNOSIS — I5032 Chronic diastolic (congestive) heart failure: Secondary | ICD-10-CM | POA: Diagnosis not present

## 2024-02-20 NOTE — Telephone Encounter (Signed)
 FYI Only or Action Required?: Action required by provider: update on patient condition and Home PT reporting fall.  Patient was last seen in primary care on 02/05/2024 by Gasper Nancyann BRAVO, MD.  Called Nurse Triage reporting reporting fall.  Symptoms began several days ago.  Interventions attempted: Nothing.  Symptoms are: completely resolved.  Triage Disposition: Home Care  Patient/caregiver understands and will follow disposition?: Yes  Copied from CRM [id number]. Topic: Clinical - Red Word Triage >> Feb 20, 2024 10:28 AM Mark Terry wrote: Red Word that prompted transfer to Nurse Triage: Patient fell the night of 02/18/24, had soreness in left thigh Reason for Disposition  Small bruise is present    Bruising not present, mild soreness  Answer Assessment - Initial Assessment Questions 1. MECHANISM: How did the fall happen?     Tripped 2. DOMESTIC VIOLENCE AND ELDER ABUSE SCREENING: Did you fall because someone pushed you or tried to hurt you? If Yes, ask: Are you safe now?     No 3. ONSET: When did the fall happen? (e.g., minutes, hours, or days ago)     02/18/24 4. LOCATION: What part of the body hit the ground? (e.g., back, buttocks, head, hips, knees, hands, head, stomach)       5. INJURY: Did you hurt (injure) yourself when you fell? If Yes, ask: What did you injure? Tell me more about this? (e.g., body area; type of injury; pain severity)     No injury noted, he does have some soreness to left thigh 6. PAIN: Is there any pain? If Yes, ask: How bad is the pain? (e.g., Scale 0-10; or none, mild,      Mild soreness of left thigh 7. SIZE: For cuts, bruises, or swelling, ask: How large is it? (e.g., inches or centimeters)      Denies  8. OTHER SYMPTOMS: Do you have any other symptoms? (e.g., dizziness, fever, weakness; new-onset or worsening).      Denies  9. CAUSE: What do you think caused the fall (or falling)? (e.g., dizzy spell, tripped)       Got  tangled in bed linen when getting up to use restroom  Additional info: [facility] PT calling to report that patient sustained a fall on 02/18/24 overnight when he got tangled in bed linen causing fall to ground, no obvious injury, he is reporting mild soreness to left thigh. No evaluation requested at this time, patient will call if no improvement.  Protocols used: Falls and [facility]

## 2024-02-20 NOTE — Patient Instructions (Addendum)
 Visit Information  Thank you for taking time to visit with me today. Please don't hesitate to contact me if I can be of assistance to you before our next scheduled telephone appointment.  Our next appointment is by telephone on 02/26/24 at 1300 (1 PM)  Following is a copy of your care plan:   Goals Addressed             This Visit's Progress    VBCI Transitions of Care (TOC) Care Plan       Problems:  Recent Hospitalization for treatment of CKD Stage 4 and Septic Shock secondary to pneumonia Diet/Nutrition/Food Resources 8/7 drinking Ensure Plus High Protein as needed, Equipment/DME barrier Hospital bed delivery scheduled for today , and Knowledge Deficit Related to management of wound care needs Wound care is being done by home health, patient states [facility] has the orders and providing the care needed. 02/13/24 Patient states home health nurse left wound open to air and patient is to call [facility] if he has any leakage or changes and verbalized understanding (updated -  02/20/24 Wound being wrapped by [facility] weekly still) Fall getting out of bed foot caught in sheets, EMS got him up. Home Health PT visit today, patient states no injury just sore, states I'm about 5 feet talk and a little over 200 lbs I didn't have far to get to floor, jokingly.  Goal:  Over the next 30 days, the patient will not experience hospital readmission  Interventions:    Chronic Kidney Disease Interventions: Assessed the patient and daughter Mark Terry's  understanding of chronic kidney disease    Evaluation of current treatment plan related to chronic kidney disease self management and patient's adherence to plan as established by provider      Reviewed prescribed diet Heart Healthy Diabetic with high Protein Ensure Plus Reviewed scheduled/upcoming provider appointments including    Discussed plans with patient for ongoing care management follow up and provided patient with direct contact information for care  management team    Last practice recorded BP readings:  BP Readings from Last 3 Encounters:  02/06/24 123/81  02/05/24 139/67  01/29/24 (!) 105/56   Most recent eGFR/CrCl:  Lab Results  Component Value Date   EGFR 27 (L) 09/25/2023    No components found for: CRCL  Patient Self Care Activities:  Attend all scheduled provider appointments Call pharmacy for medication refills 3-7 days in advance of running out of medications Call provider office for new concerns or questions  Notify RN Care Manager of TOC call rescheduling needs Participate in Transition of Care Program/Attend TOC scheduled calls Take medications as prescribed   Fall risk safety reviewed, patient states I have all equipment and HH, I just got tripped up in my bed covers.  Plan:  Telephone follow up appointment with care management team member scheduled for:  February 26, 2024 1 PM The care management team will reach out to the patient again over the next 5-10 business days. Continue to follow up on progress with HH and with TOC program for following of complex disease needs patient agrees to follow up plan, safety plan.           Patient verbalizes understanding of instructions and care plan provided today and agrees to view in MyChart. Active MyChart status and patient understanding of how to access instructions and care plan via MyChart confirmed with patient.     Telephone follow up appointment with care management team member scheduled for: The patient has  been provided with contact information for the care management team and has been advised to call with any health related questions or concerns.   Please call the care guide team at [phone] if you need to cancel or reschedule your appointment.   Please call the [place]  National Suicide Prevention Lifeline: 716 296 8707 or TTY: (720)412-6413 TTY (480)770-7000) to talk to a trained counselor if you are experiencing a Mental Health or Behavioral Health  Crisis or need someone to talk to. Mark Fish, RN, BSN, CCM [facility], [facility] Health RN Care Manager Direct Dial: [phone]

## 2024-02-21 ENCOUNTER — Telehealth: Payer: Self-pay

## 2024-02-21 DIAGNOSIS — I5032 Chronic diastolic (congestive) heart failure: Secondary | ICD-10-CM | POA: Diagnosis not present

## 2024-02-21 DIAGNOSIS — K219 Gastro-esophageal reflux disease without esophagitis: Secondary | ICD-10-CM | POA: Diagnosis not present

## 2024-02-21 DIAGNOSIS — R339 Retention of urine, unspecified: Secondary | ICD-10-CM | POA: Diagnosis not present

## 2024-02-21 DIAGNOSIS — N183 Chronic kidney disease, stage 3 unspecified: Secondary | ICD-10-CM | POA: Diagnosis not present

## 2024-02-21 DIAGNOSIS — G4733 Obstructive sleep apnea (adult) (pediatric): Secondary | ICD-10-CM | POA: Diagnosis not present

## 2024-02-21 DIAGNOSIS — Z87891 Personal history of nicotine dependence: Secondary | ICD-10-CM | POA: Diagnosis not present

## 2024-02-21 DIAGNOSIS — D631 Anemia in chronic kidney disease: Secondary | ICD-10-CM | POA: Diagnosis not present

## 2024-02-21 DIAGNOSIS — J449 Chronic obstructive pulmonary disease, unspecified: Secondary | ICD-10-CM | POA: Diagnosis not present

## 2024-02-21 DIAGNOSIS — Z9181 History of falling: Secondary | ICD-10-CM | POA: Diagnosis not present

## 2024-02-21 DIAGNOSIS — S80821S Blister (nonthermal), right lower leg, sequela: Secondary | ICD-10-CM | POA: Diagnosis not present

## 2024-02-21 DIAGNOSIS — Z96612 Presence of left artificial shoulder joint: Secondary | ICD-10-CM | POA: Diagnosis not present

## 2024-02-21 DIAGNOSIS — I25119 Atherosclerotic heart disease of native coronary artery with unspecified angina pectoris: Secondary | ICD-10-CM | POA: Diagnosis not present

## 2024-02-21 DIAGNOSIS — E7800 Pure hypercholesterolemia, unspecified: Secondary | ICD-10-CM | POA: Diagnosis not present

## 2024-02-21 DIAGNOSIS — L728 Other follicular cysts of the skin and subcutaneous tissue: Secondary | ICD-10-CM | POA: Diagnosis not present

## 2024-02-21 DIAGNOSIS — Z96641 Presence of right artificial hip joint: Secondary | ICD-10-CM | POA: Diagnosis not present

## 2024-02-21 DIAGNOSIS — E1122 Type 2 diabetes mellitus with diabetic chronic kidney disease: Secondary | ICD-10-CM | POA: Diagnosis not present

## 2024-02-21 DIAGNOSIS — M48062 Spinal stenosis, lumbar region with neurogenic claudication: Secondary | ICD-10-CM | POA: Diagnosis not present

## 2024-02-21 DIAGNOSIS — I130 Hypertensive heart and chronic kidney disease with heart failure and stage 1 through stage 4 chronic kidney disease, or unspecified chronic kidney disease: Secondary | ICD-10-CM | POA: Diagnosis not present

## 2024-02-21 DIAGNOSIS — Z96611 Presence of right artificial shoulder joint: Secondary | ICD-10-CM | POA: Diagnosis not present

## 2024-02-21 DIAGNOSIS — Z981 Arthrodesis status: Secondary | ICD-10-CM | POA: Diagnosis not present

## 2024-02-21 DIAGNOSIS — I872 Venous insufficiency (chronic) (peripheral): Secondary | ICD-10-CM | POA: Diagnosis not present

## 2024-02-21 DIAGNOSIS — K760 Fatty (change of) liver, not elsewhere classified: Secondary | ICD-10-CM | POA: Diagnosis not present

## 2024-02-21 DIAGNOSIS — Z7984 Long term (current) use of oral hypoglycemic drugs: Secondary | ICD-10-CM | POA: Diagnosis not present

## 2024-02-21 DIAGNOSIS — S80822D Blister (nonthermal), left lower leg, subsequent encounter: Secondary | ICD-10-CM | POA: Diagnosis not present

## 2024-02-21 DIAGNOSIS — Z79891 Long term (current) use of opiate analgesic: Secondary | ICD-10-CM | POA: Diagnosis not present

## 2024-02-21 NOTE — Transitions of Care (Post Inpatient/ED Visit) (Signed)
 Transition of Care week 4  Visit Note  02/21/2024 (late entry 02/20/2024  Name: Mark Terry. MRN: [id number]          DOB: Jun 12, 1935  Situation: Patient enrolled in [facility] 30-day program. Visit completed with patient by telephone.   Background:   Initial Transition Care Management Follow-up Telephone Call    Past Medical History:  Diagnosis Date   Anemia    Bruises easily    Diabetes mellitus without complication (HCC)    H/O esophagogastroduodenoscopy    H/O hiatal hernia    History of blood transfusion    no reaction noted   History of bronchitis    last time a couple of yrs ago   History of gastric ulcer    History of MRSA infection    Hyperlipidemia    Hypertension    Leg cramps    takes quinine  prn   Melanoma (HCC)    Panic attacks    Pneumonia    hx of last time a couple of yrs ago   PONV (postoperative nausea and vomiting)    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   Diagnosis: Squamous cell Carcinoma Location: Mid superior forehead Pre- operative size: 1cm x 1cm Total stages: 1 Post-Operative Size: 2.3cm x 2.1cm Date of Surgery: 05/01/2019    Assessment: Patient Reported Symptoms: Cognitive Cognitive Status: Alert and oriented to person, place, and time, Normal speech and language skills, Able to follow simple commands      Neurological Neurological Review of Symptoms: No symptoms reported Neurological Management Strategies: Activity, Adequate rest, Medication therapy Neurological Self-Management Outcome: 4 (good)  HEENT HEENT Symptoms Reported: No symptoms reported HEENT Self-Management Outcome: 4 (good)    Cardiovascular Cardiovascular Symptoms Reported: No symptoms reported Does patient have uncontrolled Hypertension?: Yes Is patient checking Blood Pressure at home?: Yes Patient's Recent BP reading at home: battery low today on machine need to charge Cardiovascular Management Strategies: Activity, Adequate rest, Medication therapy Weight:  212 lb (96.2 kg)  Respiratory Respiratory Symptoms Reported: No symptoms reported Other Respiratory Symptoms: doing better Respiratory Management Strategies: Activity, Adequate rest Respiratory Self-Management Outcome: 4 (good)  Endocrine Endocrine Symptoms Reported: No symptoms reported List most recent blood sugar readings, include date and time of day: 167    Gastrointestinal Gastrointestinal Symptoms Reported: No symptoms reported Gastrointestinal Management Strategies: Activity, Adequate rest, Diet modification, Medication therapy Gastrointestinal Self-Management Outcome: 4 (good)    Genitourinary Genitourinary Symptoms Reported: No symptoms reported Genitourinary Management Strategies: Activity, Medication therapy (Uses Furosemide  only as needed)  Integumentary Integumentary Symptoms Reported: Wound    Musculoskeletal Musculoskelatal Symptoms Reviewed: Unsteady gait, Weakness Additional Musculoskeletal Details: fell; sore today Musculoskeletal Management Strategies: Activity, Adequate rest, Exercise, Medication therapy Falls in the past year?: Yes Number of falls in past year: 1 or less Was there an injury with Fall?: No (sore right hip - got tangled in bed covers, EMS got him up) Fall Risk Category Calculator: 1 Patient Fall Risk Level: Low Fall Risk Patient at Risk for Falls Due to: Impaired balance/gait, Impaired mobility  Psychosocial Psychosocial Symptoms Reported: Anxiety - if selected complete GAD Behavioral Management Strategies: Activity, Adequate rest, Coping strategies, Medication therapy       Vitals:   02/20/24 1408  BP: (!) 166/74    Medications Reviewed Today     Reviewed by Eilleen Richerd GRADE, RN (Registered Nurse) on 02/20/24 at 1427  Med List Status: <None>   Medication Order Taking? Sig Documenting Provider Last Dose Status Informant  ACCU-CHEK GUIDE  test strip [id number] Yes CHECK BLOOD SUGAR ONCE DAILY FOR TYPE 2 DIABETES Gasper Nancyann BRAVO, MD  Active    alfuzosin  (UROXATRAL ) 10 MG 24 hr tablet [id number] Yes Take 1 tablet (10 mg total) by mouth daily with breakfast. Take in place of doxazosin  Gasper Nancyann BRAVO, MD  Active   ascorbic acid  (VITAMIN C ) 500 MG tablet [id number] Yes Take 1 tablet (500 mg total) by mouth 2 (two) times daily. Patel, Sona, MD  Active   aspirin  81 MG tablet [id number] Yes Take 81 mg by mouth daily. [provider]  Active Self           Med Note HALLIE, FELECIA N   Tue Oct 11, 2020 11:09 AM) Reports taking every other day.  DULoxetine  (CYMBALTA ) 30 MG capsule [id number] Yes TAKE 1 CAPSULE BY MOUTH EVERY DAY Gasper Nancyann BRAVO, MD  Active   empagliflozin  (JARDIANCE ) 25 MG TABS tablet [id number] Yes TAKE 1 TABLET BY MOUTH DAILY Fisher, Nancyann BRAVO, MD  Active            Med Note LESLY, RICHERD CINDERELLA Schaumann Feb 06, 2024 10:38 AM) Restarted per Nephrology  feeding supplement (ENSURE PLUS HIGH PROTEIN) LIQD [id number] Yes Take 237 mLs by mouth 2 (two) times daily between meals. Patel, Sona, MD  Active   fluticasone  (FLONASE ) 50 MCG/ACT nasal spray [id number] Yes USE 1 TO 2 SPRAYS IN BOTH  NOSTRILS DAILY Gasper Nancyann BRAVO, MD  Active   furosemide  (LASIX ) 40 MG tablet [id number]  TAKE 1 TABLET BY MOUTH DAILY  Patient taking differently: TAKE 1 TABLET BY MOUTH DAILY   Gasper Nancyann BRAVO, MD  Active   gabapentin  (NEURONTIN ) 300 MG capsule [id number] Yes TAKE 1 CAPSULE BY MOUTH 3 TIMES  DAILY Gasper Nancyann BRAVO, MD  Active   glimepiride  (AMARYL ) 1 MG tablet [id number] Yes TAKE 1 TABLET BY MOUTH DAILY Gasper Nancyann BRAVO, MD  Active   HYDROcodone -acetaminophen  (NORCO) 7.5-325 MG tablet [id number] Yes Take 1 tablet by mouth 2 (two) times daily. [provider]  Active   HYDROcodone -acetaminophen  (NORCO/VICODIN) 5-325 MG tablet [id number] Yes TAKE 1 TABLET BY MOUTH TWICE DAILY AS NEEDED [provider]  Active   levothyroxine  (SYNTHROID ) 50 MCG tablet [id number] Yes TAKE 1 TABLET BY MOUTH DAILY Gasper Nancyann BRAVO, MD  Active   loratadine   (CLARITIN ) 10 MG tablet [id number] Yes Take 10 mg by mouth daily. [provider]  Active Self  metoprolol  tartrate (LOPRESSOR ) 25 MG tablet [id number] Yes TAKE 1 TABLET BY MOUTH TWICE  DAILY Gasper Nancyann BRAVO, MD  Active   Multiple Vitamin (MULTIVITAMIN WITH MINERALS) TABS [id number] Yes Take 1 tablet by mouth daily. [provider]  Active Self  omeprazole  (PRILOSEC) 40 MG capsule [id number] Yes TAKE 1 CAPSULE BY MOUTH DAILY Gasper Nancyann BRAVO, MD  Active   oxybutynin  (DITROPAN ) 5 MG tablet [id number] Yes TAKE 1 TABLET BY MOUTH TWICE  DAILY Gasper Nancyann BRAVO, MD  Active   simvastatin  (ZOCOR ) 40 MG tablet [id number] Yes TAKE 1 TABLET BY MOUTH AT  BEDTIME Gasper Nancyann BRAVO, MD  Active   traZODone  (DESYREL ) 100 MG tablet [id number] Yes TAKE 1/2 TO 1 TABLET BY MOUTH AT BEDTIME AS NEEDED. FOR SLEEP Gasper Nancyann BRAVO, MD  Active   Vitamin D , Ergocalciferol , (DRISDOL ) 1.25 MG (50000 UNIT) CAPS capsule [id number] Yes Take 50,000 Units by mouth every 7 (seven) days. [provider]  Active   Med List Note Wendelyn Pulling, RN 08/26/18 9147): Medication agreement and Opiod contract  signed 08/26/18 MR 10/31/18 UDS 08/26/18 Serum drug screen 05/30/19            Recommendation:   Continue Current Plan of Care   Richerd Fish, RN, BSN, CCM [facility], [facility] Health RN Care Manager Direct Dial: [phone]

## 2024-02-24 DIAGNOSIS — E7800 Pure hypercholesterolemia, unspecified: Secondary | ICD-10-CM | POA: Diagnosis not present

## 2024-02-24 DIAGNOSIS — R809 Proteinuria, unspecified: Secondary | ICD-10-CM | POA: Diagnosis not present

## 2024-02-24 DIAGNOSIS — G4733 Obstructive sleep apnea (adult) (pediatric): Secondary | ICD-10-CM | POA: Diagnosis not present

## 2024-02-24 DIAGNOSIS — J449 Chronic obstructive pulmonary disease, unspecified: Secondary | ICD-10-CM | POA: Diagnosis not present

## 2024-02-24 DIAGNOSIS — N1832 Chronic kidney disease, stage 3b: Secondary | ICD-10-CM | POA: Diagnosis not present

## 2024-02-24 DIAGNOSIS — E119 Type 2 diabetes mellitus without complications: Secondary | ICD-10-CM | POA: Diagnosis not present

## 2024-02-24 DIAGNOSIS — N184 Chronic kidney disease, stage 4 (severe): Secondary | ICD-10-CM | POA: Diagnosis not present

## 2024-02-24 DIAGNOSIS — I25118 Atherosclerotic heart disease of native coronary artery with other forms of angina pectoris: Secondary | ICD-10-CM | POA: Diagnosis not present

## 2024-02-24 DIAGNOSIS — M5416 Radiculopathy, lumbar region: Secondary | ICD-10-CM | POA: Diagnosis not present

## 2024-02-24 DIAGNOSIS — I509 Heart failure, unspecified: Secondary | ICD-10-CM | POA: Diagnosis not present

## 2024-02-24 DIAGNOSIS — I10 Essential (primary) hypertension: Secondary | ICD-10-CM | POA: Diagnosis not present

## 2024-02-24 DIAGNOSIS — M48062 Spinal stenosis, lumbar region with neurogenic claudication: Secondary | ICD-10-CM | POA: Diagnosis not present

## 2024-02-24 DIAGNOSIS — R0602 Shortness of breath: Secondary | ICD-10-CM | POA: Diagnosis not present

## 2024-02-24 DIAGNOSIS — Z951 Presence of aortocoronary bypass graft: Secondary | ICD-10-CM | POA: Diagnosis not present

## 2024-02-24 DIAGNOSIS — E785 Hyperlipidemia, unspecified: Secondary | ICD-10-CM | POA: Diagnosis not present

## 2024-02-24 DIAGNOSIS — R319 Hematuria, unspecified: Secondary | ICD-10-CM | POA: Diagnosis not present

## 2024-02-24 DIAGNOSIS — R600 Localized edema: Secondary | ICD-10-CM | POA: Diagnosis not present

## 2024-02-24 DIAGNOSIS — M62830 Muscle spasm of back: Secondary | ICD-10-CM | POA: Diagnosis not present

## 2024-02-24 DIAGNOSIS — I5032 Chronic diastolic (congestive) heart failure: Secondary | ICD-10-CM | POA: Diagnosis not present

## 2024-02-25 DIAGNOSIS — S80821S Blister (nonthermal), right lower leg, sequela: Secondary | ICD-10-CM | POA: Diagnosis not present

## 2024-02-25 DIAGNOSIS — G4733 Obstructive sleep apnea (adult) (pediatric): Secondary | ICD-10-CM | POA: Diagnosis not present

## 2024-02-25 DIAGNOSIS — M48062 Spinal stenosis, lumbar region with neurogenic claudication: Secondary | ICD-10-CM | POA: Diagnosis not present

## 2024-02-25 DIAGNOSIS — Z96611 Presence of right artificial shoulder joint: Secondary | ICD-10-CM | POA: Diagnosis not present

## 2024-02-25 DIAGNOSIS — E7800 Pure hypercholesterolemia, unspecified: Secondary | ICD-10-CM | POA: Diagnosis not present

## 2024-02-25 DIAGNOSIS — Z9181 History of falling: Secondary | ICD-10-CM | POA: Diagnosis not present

## 2024-02-25 DIAGNOSIS — J449 Chronic obstructive pulmonary disease, unspecified: Secondary | ICD-10-CM | POA: Diagnosis not present

## 2024-02-25 DIAGNOSIS — K219 Gastro-esophageal reflux disease without esophagitis: Secondary | ICD-10-CM | POA: Diagnosis not present

## 2024-02-25 DIAGNOSIS — Z87891 Personal history of nicotine dependence: Secondary | ICD-10-CM | POA: Diagnosis not present

## 2024-02-25 DIAGNOSIS — I130 Hypertensive heart and chronic kidney disease with heart failure and stage 1 through stage 4 chronic kidney disease, or unspecified chronic kidney disease: Secondary | ICD-10-CM | POA: Diagnosis not present

## 2024-02-25 DIAGNOSIS — R339 Retention of urine, unspecified: Secondary | ICD-10-CM | POA: Diagnosis not present

## 2024-02-25 DIAGNOSIS — E1122 Type 2 diabetes mellitus with diabetic chronic kidney disease: Secondary | ICD-10-CM | POA: Diagnosis not present

## 2024-02-25 DIAGNOSIS — Z96641 Presence of right artificial hip joint: Secondary | ICD-10-CM | POA: Diagnosis not present

## 2024-02-25 DIAGNOSIS — D631 Anemia in chronic kidney disease: Secondary | ICD-10-CM | POA: Diagnosis not present

## 2024-02-25 DIAGNOSIS — Z79891 Long term (current) use of opiate analgesic: Secondary | ICD-10-CM | POA: Diagnosis not present

## 2024-02-25 DIAGNOSIS — S80822D Blister (nonthermal), left lower leg, subsequent encounter: Secondary | ICD-10-CM | POA: Diagnosis not present

## 2024-02-25 DIAGNOSIS — K760 Fatty (change of) liver, not elsewhere classified: Secondary | ICD-10-CM | POA: Diagnosis not present

## 2024-02-25 DIAGNOSIS — L728 Other follicular cysts of the skin and subcutaneous tissue: Secondary | ICD-10-CM | POA: Diagnosis not present

## 2024-02-25 DIAGNOSIS — N183 Chronic kidney disease, stage 3 unspecified: Secondary | ICD-10-CM | POA: Diagnosis not present

## 2024-02-25 DIAGNOSIS — I25119 Atherosclerotic heart disease of native coronary artery with unspecified angina pectoris: Secondary | ICD-10-CM | POA: Diagnosis not present

## 2024-02-25 DIAGNOSIS — Z96612 Presence of left artificial shoulder joint: Secondary | ICD-10-CM | POA: Diagnosis not present

## 2024-02-25 DIAGNOSIS — I5032 Chronic diastolic (congestive) heart failure: Secondary | ICD-10-CM | POA: Diagnosis not present

## 2024-02-25 DIAGNOSIS — I872 Venous insufficiency (chronic) (peripheral): Secondary | ICD-10-CM | POA: Diagnosis not present

## 2024-02-25 DIAGNOSIS — Z7984 Long term (current) use of oral hypoglycemic drugs: Secondary | ICD-10-CM | POA: Diagnosis not present

## 2024-02-25 DIAGNOSIS — Z981 Arthrodesis status: Secondary | ICD-10-CM | POA: Diagnosis not present

## 2024-02-26 ENCOUNTER — Other Ambulatory Visit: Payer: Self-pay

## 2024-02-26 ENCOUNTER — Telehealth: Payer: Self-pay

## 2024-02-26 DIAGNOSIS — I5032 Chronic diastolic (congestive) heart failure: Secondary | ICD-10-CM

## 2024-02-26 DIAGNOSIS — M48062 Spinal stenosis, lumbar region with neurogenic claudication: Secondary | ICD-10-CM | POA: Diagnosis not present

## 2024-02-26 DIAGNOSIS — M5030 Other cervical disc degeneration, unspecified cervical region: Secondary | ICD-10-CM

## 2024-02-26 DIAGNOSIS — Z96641 Presence of right artificial hip joint: Secondary | ICD-10-CM

## 2024-02-26 DIAGNOSIS — L728 Other follicular cysts of the skin and subcutaneous tissue: Secondary | ICD-10-CM | POA: Diagnosis not present

## 2024-02-26 DIAGNOSIS — S80822D Blister (nonthermal), left lower leg, subsequent encounter: Secondary | ICD-10-CM | POA: Diagnosis not present

## 2024-02-26 DIAGNOSIS — N183 Chronic kidney disease, stage 3 unspecified: Secondary | ICD-10-CM | POA: Diagnosis not present

## 2024-02-26 DIAGNOSIS — E1122 Type 2 diabetes mellitus with diabetic chronic kidney disease: Secondary | ICD-10-CM | POA: Diagnosis not present

## 2024-02-26 DIAGNOSIS — E7800 Pure hypercholesterolemia, unspecified: Secondary | ICD-10-CM | POA: Diagnosis not present

## 2024-02-26 DIAGNOSIS — M47816 Spondylosis without myelopathy or radiculopathy, lumbar region: Secondary | ICD-10-CM

## 2024-02-26 DIAGNOSIS — E1121 Type 2 diabetes mellitus with diabetic nephropathy: Secondary | ICD-10-CM

## 2024-02-26 DIAGNOSIS — I872 Venous insufficiency (chronic) (peripheral): Secondary | ICD-10-CM | POA: Diagnosis not present

## 2024-02-26 DIAGNOSIS — S80821S Blister (nonthermal), right lower leg, sequela: Secondary | ICD-10-CM | POA: Diagnosis not present

## 2024-02-26 DIAGNOSIS — D631 Anemia in chronic kidney disease: Secondary | ICD-10-CM | POA: Diagnosis not present

## 2024-02-26 DIAGNOSIS — J449 Chronic obstructive pulmonary disease, unspecified: Secondary | ICD-10-CM | POA: Diagnosis not present

## 2024-02-26 DIAGNOSIS — I130 Hypertensive heart and chronic kidney disease with heart failure and stage 1 through stage 4 chronic kidney disease, or unspecified chronic kidney disease: Secondary | ICD-10-CM | POA: Diagnosis not present

## 2024-02-26 NOTE — Transitions of Care (Post Inpatient/ED Visit) (Signed)
 Transition of Care Week 5 Complete  Visit Note  02/26/2024  Name: Mark Terry. MRN: [id number]          DOB: 06-Jan-1935  Situation: Patient enrolled in [facility] 30-day program. Visit completed with patient by telephone.   Background:   Initial Transition Care Management Follow-up Telephone Call    Past Medical History:  Diagnosis Date   Anemia    Bruises easily    Diabetes mellitus without complication (HCC)    H/O esophagogastroduodenoscopy    H/O hiatal hernia    History of blood transfusion    no reaction noted   History of bronchitis    last time a couple of yrs ago   History of gastric ulcer    History of MRSA infection    Hyperlipidemia    Hypertension    Leg cramps    takes quinine  prn   Melanoma (HCC)    Panic attacks    Pneumonia    hx of last time a couple of yrs ago   PONV (postoperative nausea and vomiting)    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   Diagnosis: Squamous cell Carcinoma Location: Mid superior forehead Pre- operative size: 1cm x 1cm Total stages: 1 Post-Operative Size: 2.3cm x 2.1cm Date of Surgery: 05/01/2019    Assessment: Patient Reported Symptoms: Cognitive Cognitive Status: Alert and oriented to person, place, and time, Normal speech and language skills      Neurological Neurological Review of Symptoms: No symptoms reported Neurological Management Strategies: Activity, Adequate rest, Medication therapy Neurological Self-Management Outcome: 4 (good)  HEENT HEENT Symptoms Reported: No symptoms reported HEENT Management Strategies: Medication therapy HEENT Self-Management Outcome: 4 (good)    Cardiovascular Cardiovascular Symptoms Reported: No symptoms reported Does patient have uncontrolled Hypertension?: Yes Is patient checking Blood Pressure at home?: No Cardiovascular Management Strategies: Activity, Adequate rest, Medication therapy Weight: 208 lb (94.3 kg) Cardiovascular Self-Management Outcome: 4 (good)   Respiratory Respiratory Symptoms Reported: No symptoms reported Other Respiratory Symptoms: better - no problems Respiratory Management Strategies: Activity, Adequate rest Respiratory Self-Management Outcome: 4 (good)  Endocrine Endocrine Symptoms Reported: No symptoms reported    Gastrointestinal Gastrointestinal Symptoms Reported: No symptoms reported Gastrointestinal Management Strategies: Activity, Adequate rest, Diet modification, Medication therapy Gastrointestinal Self-Management Outcome: 4 (good) Gastrointestinal Comment: has his own bowel regimen    Genitourinary Genitourinary Symptoms Reported: No symptoms reported Genitourinary Management Strategies: Activity, Medication therapy Genitourinary Self-Management Outcome: 4 (good)  Integumentary Integumentary Symptoms Reported: Wound Additional Integumentary Details: going to wound clinic tomorrow Skin Management Strategies: Dressing changes Skin Self-Management Outcome: 4 (good)  Musculoskeletal Musculoskelatal Symptoms Reviewed: Unsteady gait Additional Musculoskeletal Details: Working with [facility] PT Musculoskeletal Management Strategies: Activity, Adequate rest, Exercise, Medical device, Medication therapy, Weight management (trying to lose weight, I need to lose another 40 lbs) Musculoskeletal Self-Management Outcome: 4 (good) Musculoskeletal Comment: better Falls in the past year?: Yes Number of falls in past year: 1 or less Was there an injury with Fall?: No Fall Risk Category Calculator: 1 Patient Fall Risk Level: Low Fall Risk Patient at Risk for Falls Due to: Impaired balance/gait  Psychosocial Psychosocial Symptoms Reported: Anxiety - if selected complete GAD         There were no vitals filed for this visit. Continue to encourage patient to monitor BPs, ongoing follow up needed.  Medications Reviewed Today     Reviewed by Eilleen Richerd GRADE, RN (Registered Nurse) on 02/26/24 at 1306  Med List Status: <None>    Medication Order Taking?  Sig Documenting Provider Last Dose Status Informant  ACCU-CHEK GUIDE test strip [id number] Yes CHECK BLOOD SUGAR ONCE DAILY FOR TYPE 2 DIABETES Gasper Nancyann BRAVO, MD  Active   alfuzosin  (UROXATRAL ) 10 MG 24 hr tablet [id number] Yes Take 1 tablet (10 mg total) by mouth daily with breakfast. Take in place of doxazosin  Gasper Nancyann BRAVO, MD  Active   ascorbic acid  (VITAMIN C ) 500 MG tablet [id number] Yes Take 1 tablet (500 mg total) by mouth 2 (two) times daily. Patel, Sona, MD  Active   aspirin  81 MG tablet [id number] Yes Take 81 mg by mouth daily. [provider]  Active Self           Med Note HALLIE, FELECIA N   Tue Oct 11, 2020 11:09 AM) Reports taking every other day.  DULoxetine  (CYMBALTA ) 30 MG capsule [id number] Yes TAKE 1 CAPSULE BY MOUTH EVERY DAY Gasper Nancyann BRAVO, MD  Active   empagliflozin  (JARDIANCE ) 25 MG TABS tablet [id number] Yes TAKE 1 TABLET BY MOUTH DAILY Fisher, Nancyann BRAVO, MD  Active            Med Note LESLY, RICHERD CINDERELLA Schaumann Feb 06, 2024 10:38 AM) Restarted per Nephrology  feeding supplement (ENSURE PLUS HIGH PROTEIN) LIQD [id number] Yes Take 237 mLs by mouth 2 (two) times daily between meals. Patel, Sona, MD  Active   fluticasone  (FLONASE ) 50 MCG/ACT nasal spray [id number] Yes USE 1 TO 2 SPRAYS IN BOTH  NOSTRILS DAILY Gasper Nancyann BRAVO, MD  Active   furosemide  (LASIX ) 40 MG tablet [id number] Yes TAKE 1 TABLET BY MOUTH DAILY  Patient taking differently: TAKE 1 TABLET BY MOUTH DAILY   Gasper Nancyann BRAVO, MD  Active   gabapentin  (NEURONTIN ) 300 MG capsule [id number] Yes TAKE 1 CAPSULE BY MOUTH 3 TIMES  DAILY Gasper Nancyann BRAVO, MD  Active   glimepiride  (AMARYL ) 1 MG tablet [id number] Yes TAKE 1 TABLET BY MOUTH DAILY Gasper Nancyann BRAVO, MD  Active   HYDROcodone -acetaminophen  (NORCO) 7.5-325 MG tablet [id number] Yes Take 1 tablet by mouth 2 (two) times daily. [provider]  Active   HYDROcodone -acetaminophen  (NORCO/VICODIN) 5-325 MG tablet [id number] Yes  TAKE 1 TABLET BY MOUTH TWICE DAILY AS NEEDED [provider]  Active   levothyroxine  (SYNTHROID ) 50 MCG tablet [id number] Yes TAKE 1 TABLET BY MOUTH DAILY Gasper Nancyann BRAVO, MD  Active   loratadine  (CLARITIN ) 10 MG tablet [id number] Yes Take 10 mg by mouth daily. [provider]  Active Self  metoprolol  tartrate (LOPRESSOR ) 25 MG tablet [id number] Yes TAKE 1 TABLET BY MOUTH TWICE  DAILY Gasper Nancyann BRAVO, MD  Active   Multiple Vitamin (MULTIVITAMIN WITH MINERALS) TABS [id number] Yes Take 1 tablet by mouth daily. [provider]  Active Self  omeprazole  (PRILOSEC) 40 MG capsule [id number] Yes TAKE 1 CAPSULE BY MOUTH DAILY Gasper Nancyann BRAVO, MD  Active   oxybutynin  (DITROPAN ) 5 MG tablet [id number] Yes TAKE 1 TABLET BY MOUTH TWICE  DAILY Gasper Nancyann BRAVO, MD  Active   simvastatin  (ZOCOR ) 40 MG tablet [id number] Yes TAKE 1 TABLET BY MOUTH AT  BEDTIME Gasper Nancyann BRAVO, MD  Active   traZODone  (DESYREL ) 100 MG tablet [id number] Yes TAKE 1/2 TO 1 TABLET BY MOUTH AT BEDTIME AS NEEDED. FOR SLEEP Gasper Nancyann BRAVO, MD  Active   Vitamin D , Ergocalciferol , (DRISDOL ) 1.25 MG (50000 UNIT) CAPS capsule [id number] Yes Take 50,000 Units by mouth every 7 (seven) days. [provider]  Active   Med List Note Wendelyn,  Devere, RN 08/26/18 9147): Medication agreement and Opiod contract signed 08/26/18 MR 10/31/18 UDS 08/26/18 Serum drug screen 05/30/19            Recommendation:   Referral to: Longitudinal RN for CCM  Continue Current Plan of Care  Follow Up Plan:   Closing From:  Transitions of Care Program Patient has met all care management goals. Care Management case will be closed. Patient has been provided contact information should new needs arise.   Richerd Fish, RN, BSN, CCM [facility], [facility] Health RN Care Manager Direct Dial: [phone]

## 2024-02-26 NOTE — Patient Instructions (Signed)
 Visit Information  Thank you for taking time to visit with me today. Please don't hesitate to contact me if I can be of assistance to you before our next scheduled telephone appointment.  Patient completed 30 day TOC program and accepted referral for RN CCM follow up.  Following is a copy of your care plan:   Goals Addressed             This Visit's Progress    VBCI Transitions of Care (TOC) Care Plan   On track    Problems:  Recent Hospitalization for treatment of CKD Stage 4 and Septic Shock secondary to pneumonia Diet/Nutrition/Food Resources 8/7 drinking Ensure Plus High Protein as needed, Equipment/DME barrier Hospital bed delivery scheduled for today , and Knowledge Deficit Related to management of wound care needs Wound care is being done by home health, patient states [facility] has the orders and providing the care needed. 02/13/24 Patient states home health nurse left wound open to air and patient is to call [facility] if he has any leakage or changes and verbalized understanding (updated -  02/20/24 Wound being wrapped by [facility] weekly still) Fall getting out of bed foot caught in sheets, EMS got him up. Home Health PT visit today, patient states no injury just sore, states I'm about 5 feet talk and a little over 200 lbs I didn't have far to get to floor, jokingly.  Goal:  Over the next 30 days, the patient will not experience hospital readmission  Interventions:    Chronic Kidney Disease Interventions: Assessed the patient and daughter [name]'s  understanding of chronic kidney disease    Evaluation of current treatment plan related to chronic kidney disease self management and patient's adherence to plan as established by provider      Reviewed prescribed diet Heart Healthy Diabetic with high Protein Ensure Plus Reviewed scheduled/upcoming provider appointments including    Discussed plans with patient for ongoing care management follow up and provided patient with direct contact  information for care management team    Last practice recorded BP readings:  BP Readings from Last 3 Encounters:  02/06/24 123/81  02/05/24 139/67  01/29/24 (!) 105/56   Most recent eGFR/CrCl:  Lab Results  Component Value Date   EGFR 27 (L) 09/25/2023    No components found for: CRCL  Patient Self Care Activities:  Attend all scheduled provider appointments Call pharmacy for medication refills 3-7 days in advance of running out of medications Call provider office for new concerns or questions  Notify RN Care Manager of TOC call rescheduling needs Participate in Transition of Care Program/Attend TOC scheduled calls Take medications as prescribed   Fall risk safety reviewed, patient states I have all equipment and HH, I just got tripped up in my bed covers.  Plan:  Telephone follow up appointment with care management team member scheduled for:  February 26, 2024 1 PM The care management team will reach out to the patient again over the next 5-10 business days. Continue to follow up on progress with HH and with TOC program for following of complex disease needs patient agrees to follow up plan, safety plan.  02/26/24 Refer patient to [facility] RN for ongoing complex care management needs. Completed 30 day TOC program.          Patient verbalizes understanding of instructions and care plan provided today and agrees to view in MyChart. Active MyChart status and patient understanding of how to access instructions and care plan via MyChart confirmed  with patient.   Daughter Olam patient states keeps him abreast of his MyChart.  The patient has been provided with contact information for the care management team and has been advised to call with any health related questions or concerns.  The care management team will reach out to the patient again over the next 30 days.  The [facility] team will follow up with the patient and will provide direct communication to the PCP for this  patient. Patient already involved  Please call the care guide team at [phone] if you need to cancel or reschedule your appointment.   Please call the [place]  National Suicide Prevention Lifeline: (413) 310-2269 or TTY: 234-490-9244 TTY 540-520-9473) to talk to a trained counselor call 1-800-273-TALK (toll free, 24 hour hotline) if you are experiencing a Mental Health or Behavioral Health Crisis or need someone to talk to.  Richerd Fish, RN, BSN, CCM [facility], [facility] Health RN Care Manager Direct Dial: [phone]

## 2024-02-27 ENCOUNTER — Other Ambulatory Visit: Payer: Self-pay | Admitting: Physician Assistant

## 2024-02-27 ENCOUNTER — Encounter: Attending: Physician Assistant | Admitting: Physician Assistant

## 2024-02-27 ENCOUNTER — Ambulatory Visit
Admission: RE | Admit: 2024-02-27 | Discharge: 2024-02-27 | Disposition: A | Discharge status: Home / Self Care | Source: Ambulatory Visit | Attending: Physician Assistant | Admitting: Physician Assistant

## 2024-02-27 DIAGNOSIS — I129 Hypertensive chronic kidney disease with stage 1 through stage 4 chronic kidney disease, or unspecified chronic kidney disease: Secondary | ICD-10-CM | POA: Diagnosis not present

## 2024-02-27 DIAGNOSIS — L97822 Non-pressure chronic ulcer of other part of left lower leg with fat layer exposed: Secondary | ICD-10-CM | POA: Diagnosis not present

## 2024-02-27 DIAGNOSIS — Z96643 Presence of artificial hip joint, bilateral: Secondary | ICD-10-CM | POA: Diagnosis not present

## 2024-02-27 DIAGNOSIS — E1143 Type 2 diabetes mellitus with diabetic autonomic (poly)neuropathy: Secondary | ICD-10-CM | POA: Diagnosis not present

## 2024-02-27 DIAGNOSIS — S31000A Unspecified open wound of lower back and pelvis without penetration into retroperitoneum, initial encounter: Secondary | ICD-10-CM

## 2024-02-27 DIAGNOSIS — R319 Hematuria, unspecified: Secondary | ICD-10-CM | POA: Diagnosis not present

## 2024-02-27 DIAGNOSIS — L97412 Non-pressure chronic ulcer of right heel and midfoot with fat layer exposed: Secondary | ICD-10-CM | POA: Diagnosis not present

## 2024-02-27 DIAGNOSIS — M47816 Spondylosis without myelopathy or radiculopathy, lumbar region: Secondary | ICD-10-CM | POA: Diagnosis not present

## 2024-02-27 DIAGNOSIS — E11621 Type 2 diabetes mellitus with foot ulcer: Secondary | ICD-10-CM | POA: Diagnosis not present

## 2024-02-27 DIAGNOSIS — R809 Proteinuria, unspecified: Secondary | ICD-10-CM | POA: Diagnosis not present

## 2024-02-27 DIAGNOSIS — L97312 Non-pressure chronic ulcer of right ankle with fat layer exposed: Secondary | ICD-10-CM | POA: Diagnosis not present

## 2024-02-27 DIAGNOSIS — E785 Hyperlipidemia, unspecified: Secondary | ICD-10-CM | POA: Diagnosis not present

## 2024-02-27 DIAGNOSIS — N184 Chronic kidney disease, stage 4 (severe): Secondary | ICD-10-CM | POA: Diagnosis not present

## 2024-02-27 DIAGNOSIS — E876 Hypokalemia: Secondary | ICD-10-CM | POA: Diagnosis not present

## 2024-02-27 DIAGNOSIS — L729 Follicular cyst of the skin and subcutaneous tissue, unspecified: Secondary | ICD-10-CM | POA: Diagnosis not present

## 2024-02-27 DIAGNOSIS — L97522 Non-pressure chronic ulcer of other part of left foot with fat layer exposed: Secondary | ICD-10-CM | POA: Diagnosis not present

## 2024-02-27 DIAGNOSIS — I509 Heart failure, unspecified: Secondary | ICD-10-CM | POA: Diagnosis not present

## 2024-02-27 NOTE — Progress Notes (Signed)
 [facility] Follow Up Visit   Patient Name: Mark Jarriel., male   Patient DOB: 1935/02/07 Date of Service: 02/27/2024  Patient MRN: [id number] Provider Creating Note: Woodward Brought, MD  [phone] Primary Care Physician: Gasper Nancyann BRAVO, MD   [address] [place] [address] Additional Physicians/ Providers:   Impression/Recommendations   Mr. Mark Trudell. is a 89 y.o. male with diabetes mellitus type II, hypertension, congestive heart failure, fatty liver, COPD, hypothyroidism, sleep apnea and coronary artery disease status post CABG who presents as a follow up patient for evaluation of chronic kidney disease stage IV. Creatinine 1.72, GFR of 38.    Chronic Kidney Disease stage IV: with proteinuria: with history of hypertension, diabetes mellitus type II and NSAIDs (meloxicam  and ibuprofen) - Continue empagliflozin   - restart losartan  25mg  daily. Labs in a few weeks.  - Continue spironolactone 25mg  daily - Avoid nonsteroidal anti-inflammatory agents.    Hypertension with chronic kidney disease: history of chronic congestive heart failure: 123/75.  - Continue furosemide , spironolactone alfuzosin , and furosemide .  - restart losartan  as above.  - Limit to 2 liter fluid restriction - salt restriction - home blood pressure monitoring   Diabetes mellitus type II with chronic kidney disease: noninsulin dependent. Off metformin . - Continue empagliflozin  - not currently on an GLP-1 agent   Anemia with chronic kidney disease: with thrombocytopenia: hemoglobin has improved to 10.8    Patient Active Problem List  Diagnosis  . Congestive heart failure (HCC)  . Hyperlipidemia  . Hematuria  . Proteinuria  . Chronic kidney disease, Stage IV (severe) (HCC)  . Hypertensive chronic kidney disease, benign, with chronic kidney disease stage I through stage IV, or unspecified  . Hypokalemia    Orders Placed This Encounter  . Renal Function  Panel  . Urinalysis, Complete w/reflex to Culture  . Protein, Total, Random Urine w/Creatinine (Protein/Creat Ratio)  . losartan  (Cozaar ) 25 MG tablet       Return in about 4 weeks (around 03/26/2024).  Chief Complaint   Chief Complaint  Patient presents with  . Follow-up    History of Present Illness   Mr. Mark Terry. presents for follow up. Patient presents with his daughter who assists with history taking.   Patient states he is feeling better. Patient states that he has a sacral wound that needs work up for osteomyelitis. He was at wound care earlier today. His is having his feet wrapped by home health. He states his lower extremity swelling is well controlled.   He is having some right hip pain. Daughter is concerned this is due to him transferring himself.    Medications   Current Outpatient Medications:  .  alfuzosin  (UROXATRAL ) 10 MG 24 hr tablet, Take 1 tablet (10 mg total) by mouth daily with breakfast. Take in place of doxazosin , Disp: , Rfl:  .  ASPIRIN  81 PO, Take 81 mg by mouth Every other day, Disp: , Rfl:  .  Empagliflozin  (Jardiance ) 25 MG tablet, Take 25 mg by mouth 1 (one) time each day, Disp: , Rfl:  .  ergocalciferol  1.25 MG (50000 UT) capsule, Take 50,000 Units by mouth every 7 (seven) days., Disp: , Rfl:  .  fluticasone  (FLONASE ) 50 MCG/ACT nasal spray, Administer 2 sprays into affected nostril(s) in the morning., Disp: , Rfl:  .  furosemide  (LASIX ) 40 MG tablet, Take 40 mg by mouth 1 (one) time each day, Disp: , Rfl:  .  gabapentin  (  NEURONTIN ) 300 MG capsule, Take 300 mg by mouth in the morning and 300 mg at noon and 300 mg in the evening., Disp: , Rfl:  .  glimepiride  (AMARYL ) 2 MG tablet, Take 2 mg by mouth 1 (one) time each day, Disp: , Rfl:  .  HYDROcodone -acetaminophen  (NORCO) 7.5-325 MG per tablet, 1/2-1 po tid prn, Disp: , Rfl:  .  levothyroxine  (SYNTHROID , LEVOTHROID) 25 MCG tablet, Take 25 mcg by mouth 1 (one) time each day, Disp: , Rfl:   .  loratadine  (CLARITIN ) 10 MG tablet, Take 10 mg by mouth 1 (one) time each day, Disp: , Rfl:  .  losartan  (Cozaar ) 25 MG tablet, Take 1 tablet (25 mg total) by mouth 1 (one) time each day, Disp: 30 tablet, Rfl: 11 .  metoprolol  tartrate 25 MG tablet, Take 25 mg by mouth in the morning and 25 mg in the evening., Disp: , Rfl:  .  Multiple Vitamins-Minerals (Multi Vitamin/Minerals) tablet, Take 1 tablet by mouth 1 (one) time each day, Disp: , Rfl:  .  omeprazole  (PriLOSEC) 40 MG DR capsule, , Disp: , Rfl:  .  oxybutynin  (DITROPAN ) 5 MG tablet, Take 5 mg by mouth in the morning and 5 mg in the evening and 5 mg before bedtime., Disp: , Rfl:  .  simvastatin  (ZOCOR ) 40 MG tablet, Take 40 mg by mouth at bed time, Disp: , Rfl:  .  spironolactone (Aldactone) 25 MG tablet, Take 1 tablet (25 mg total) by mouth 1 (one) time each day, Disp: 30 tablet, Rfl: 11 .  traZODone  (DESYREL ) 100 MG tablet, Take 0.5-1 tablets by mouth nightly as needed, Disp: , Rfl:    Allergies Meperidine, Propoxyphene, Codeine, and Oxycodone   History Past Medical History:  Diagnosis Date  . Cancer of skin   . Chronic kidney disease stage 3B (HCC)   . Chronic obstructive pulmonary disease (HCC)   . Congestive heart failure (HCC)   . Coronary atherosclerosis of unspecified type of vessel, native or graft   . Fatty liver   . Gastroesophageal reflux disease   . Hematuria   . Hyperlipidemia   . Hypertensive chronic kidney disease, benign, with chronic kidney disease stage I through stage IV, or unspecified   . Hypokalemia   . Hypothyroidism   . Neuropathy   . Obstructive sleep apnea   . Osteoarthritis   . Proteinuria     Past Surgical History:  Procedure Laterality Date  . ANKLE FUSION  2017  . BACK SURGERY    . CHOLECYSTECTOMY    . CORONARY ARTERY BYPASS GRAFT    . HIP ARTHROPLASTY Right 2012   Dr. Mardee  . TOTAL KNEE ARTHROPLASTY Bilateral   . TOTAL SHOULDER ARTHROPLASTY Bilateral    Family History  Problem  Relation Age of Onset  . Dementia Mother   . Heart disease Father    Social History   Tobacco Use  . Smoking status: Never  . Smokeless tobacco: Never  Substance Use Topics  . Alcohol use: Never     Physical Exam  Vitals BP 123/75 (BP Location: Right upper arm, Patient Position: Sitting)   Pulse 79   Temp 97.6 F   Wt 213 lb (96.6 kg)   SpO2 94%   BMI 40.25 kg/m   Vitals reviewed. Constitutional: He is oriented to person, place, and time. He appears well-developed.  HEENT:  Head: Normocephalic and atraumatic. Mouth/Throat: Oropharynx is clear and moist.  Eyes: Pupils are equal, round, and reactive to light.  Neck:  Neck supple.  Cardiovascular:  Normal rate and regular rhythm.           Pulmonary/Chest: Effort normal and breath sounds normal.  Abdominal: Soft.  Neurological: He is alert and oriented to person, place, and time.  Skin: Skin is warm and dry.     Laboratory Studies  Chemistry  Lab Units 02/24/24 0959 02/05/24 1559 12/25/23 1507 11/27/23 1125 10/23/23 1119  SODIUM mmol/L 142 139 135 139 140  POTASSIUM mmol/L 4.1 5.2 4.4 4.0 4.3  CHLORIDE mmol/L 109 108 104 105 106  CO2 mmol/L 22 22 23 22  26.5  ANION GAP   --   --   --   --  11.8  MAGNESIUM mg/dL  --   --   --  2.2  --   CALCIUM mg/dL 8.9 9.6 9.3 9.1 9.2  PHOSPHORUS mg/dL 3.5 3.7 4.0 4.4*  --   PTH pg/mL 124*  --   --  98*  --   GLUCOSE mg/dL 814* 836* 774* 834* 825*  ALBUMIN g/dL 4.2 3.8 4.0 3.5*  3.6  --   BUN mg/dL 25 24 44* 45* 50*  CREATININE mg/dL 8.27* 8.31* 7.51* 7.38* 2.3*        No lab exists for component: IRON SATURATION, TRANSSATPER  CBC  Lab Units 02/24/24 0959 02/05/24 1559 12/25/23 1507 11/27/23 1125  WBC AUTO Thousand/uL 5.8  --   --  6.4  HEMOGLOBIN g/dL 89.1*  --   --  9.8*  HEMOGLOBIN URINE  NEGATIVE NEGATIVE NEGATIVE  --   HEMATOCRIT % 33.5*  --   --  30.3*  MCV fL 99.7  --   --  96.2  PLATELETS AUTO Thousand/uL 114*  --   --  102*    Urine  Lab Units  02/24/24 0959 02/05/24 1559 12/25/23 1507 11/27/23 1125 11/27/23 1029  COLOR U  YELLOW YELLOW YELLOW  --   --   COLOR UA   --   --   --   --  Yellow  CLARITY UA   --   --   --   --  Clear  KETONES U MG/DL  NEGATIVE NEGATIVE NEGATIVE  --   --   KETONES UA   --   --   --   --  Negative  PH UA   --   --   --   --  5.5  UROBILINOGEN UA   --   --   --   --  0.2  PROT/CREAT RATIO UR mg/g creat 0.500*  500* 0.513*  513*  --  0.986*  986*  --         No lab exists for component: CYCLOSPORITR     Woodward Brought, MD  [employer], [place]

## 2024-02-28 DIAGNOSIS — G4733 Obstructive sleep apnea (adult) (pediatric): Secondary | ICD-10-CM | POA: Diagnosis not present

## 2024-02-28 DIAGNOSIS — J449 Chronic obstructive pulmonary disease, unspecified: Secondary | ICD-10-CM | POA: Diagnosis not present

## 2024-02-28 NOTE — Progress Notes (Signed)
 Follow-up   History of Present Illness: Mark Terry. is a 89 y.o. male who presents to clinic for annual recheck. Hx of osa, on cpap. Not wearing it each night. Over weight.   I get tired more so in the last months. No chest pain, leg edema, ( legs wrapped). No fever or chill. Was at the hospital for hypotension. B/p is good today. No respiratory distress.   He walks with a Rollyson.   Current Medications:  Current Outpatient Medications  Medication Sig Dispense Refill  . ACCU-CHEK FASTCLIX LANCET DRUM     . ACCU-CHEK GUIDE TEST STRIPS test strip     . alfuzosin  (UROXATRAL ) 10 mg ER tablet Take 1 tablet (10 mg total) by mouth daily with breakfast. Take in place of doxazosin     . aspirin  81 MG chewable tablet Take 81 mg by mouth once daily.    . blood glucose diagnostic (ACCU-CHEK GUIDE TEST STRIPS) test strip 1 strip once daily    . blood sugar diagnostic, drum (BLOOD GLUCOSE DIAGNOSTIC, DRUM) test strip CHECK SUGAR TWICE DAILY AS DIRECTED    . cholecalciferol 1000 unit tablet Take 1,000 Units by mouth once daily    . DULoxetine  (CYMBALTA ) 30 MG DR capsule Take 1 capsule by mouth once daily    . fluticasone  (FLONASE ) 50 mcg/actuation nasal spray Place 2 sprays into both nostrils once daily    . FUROsemide  (LASIX ) 40 MG tablet TAKE ONE (1) TABLET BY MOUTH EVERY DAY 90 tablet 3  . glimepiride  (AMARYL ) 2 MG tablet Take 2 mg by mouth daily with breakfast    . HYDROcodone -acetaminophen  (NORCO) 7.5-325 mg tablet 1/2-1 po tid prn 90 tablet 0  . [START ON 03/26/2024] HYDROcodone -acetaminophen  (NORCO) 7.5-325 mg tablet 1/2-1 po tid prn 90 tablet 0  . JARDIANCE  25 mg tablet Take 25 mg by mouth once daily    . levothyroxine  (SYNTHROID ) 25 MCG tablet Take 50 mcg by mouth once daily    . loratadine  (CLARITIN ) 10 mg capsule Take 10 mg by mouth once daily.    . methocarbamoL  (ROBAXIN ) 500 MG tablet 1/2-1 po qHS prn 30 tablet 0  . metoprolol  tartrate (LOPRESSOR ) 25 MG tablet Take 25 mg by mouth 2  (two) times daily    . mirtazapine (REMERON) 7.5 MG tablet Take 7.5 mg by mouth nightly      . multivitamin tablet Take 1 tablet by mouth once daily.    . nystatin  (MYCOSTATIN ) 100,000 unit/gram powder Apply 1 g topically 2 (two) times daily.      . omeprazole  (PRILOSEC) 40 MG DR capsule     . oxybutynin  (DITROPAN ) 5 mg tablet     . QUININE  SULFATE ORAL Take by mouth once daily    . simvastatin  (ZOCOR ) 40 MG tablet Take 40 mg by mouth nightly.    SABRA spironolactone (ALDACTONE) 25 MG tablet Take 25 mg by mouth once daily    . traZODone  (DESYREL ) 100 MG tablet Take 0.5-1 tablets by mouth nightly as needed       No current facility-administered medications for this visit.    Problem List:  Patient Active Problem List  Diagnosis  . Lumbar radiculitis  . Lumbar spondylosis  . DDD (degenerative disc disease), lumbar  . Coronary disease  . Hypertension  . Hypercholesterolemia  . Angina pectoris ()  . Cervical radiculitis  . DDD (degenerative disc disease), cervical  . Cervical spinal stenosis  . Status post total replacement of right hip  . Primary osteoarthritis of  left hip  . Arthritis of ankle or foot, degenerative  . Chronic gastritis  . COPD (chronic obstructive pulmonary disease) (CMS/HHS-HCC)  . Diabetes mellitus, type 2 (CMS/HHS-HCC)  . History of colonic polyps  . Insomnia  . Nocturia  . OSA on CPAP  . Urge incontinence  . Urinary frequency  . Dysphagia  . History of lumbar laminectomy for spinal cord decompression  . Chronic pain syndrome  . Personal history of other malignant neoplasm of skin  . Anosmia  . BMI 40.0-44.9, adult (CMS/HHS-HCC)  . Aortic atherosclerosis ()  . Chronic kidney disease, stage 3b (CMS/HHS-HCC)  . Diabetic necrobiosis lipoidica (CMS/HHS-HCC)  . Hypothyroidism    Allergies:  Darvon [propoxyphene], Demerol [meperidine], and Propoxyphene hcl  History:  Past Medical History:  Diagnosis Date  . Angina pectoris ()   . CHF (congestive  heart failure) (CMS/HHS-HCC)   . COPD (chronic obstructive pulmonary disease) (CMS/HHS-HCC)   . Coronary disease   . Diabetes (CMS/HHS-HCC)   . DJD (degenerative joint disease)   . DJD (degenerative joint disease), ankle and foot, right   . Fatty liver   . GERD (gastroesophageal reflux disease)   . Hypercholesterolemia   . Hypertension   . OSA (obstructive sleep apnea)     Past Surgical History:  Procedure Laterality Date  . Right total hip arthroplasty  03/28/2011   Dr. Mardee  . FUSION ARTHRODESIS ANKLE W/ARTHROSCOPY Right 10/2015  . EXCISION GANGLION CYST WRIST PRIMARY Right 10/22/2022   rist-Dr. Ozell Flake  . Back surgery    . CHOLECYSTECTOMY    . Coronary bypass surgery    . PCI    . REPLACEMENT TOTAL KNEE Bilateral   . TOTAL SHOULDER REPLACEMENT Bilateral     Family History  Problem Relation Name Age of Onset  . Coronary Artery Disease (Blocked arteries around heart) Father    . Myocardial Infarction (Heart attack) Father    . High blood pressure (Hypertension) Mother    . Dementia Mother      SHX:  reports that he quit smoking about 49 years ago. His smoking use included cigarettes. He started smoking about 79 years ago. He has a 30 pack-year smoking history. He has been exposed to tobacco smoke. He has never used smokeless tobacco. He reports that he does not drink alcohol and does not use drugs.  Review of System: As per above. All systems were reviewed with him in totality. Again, no other cardiopulmonary symptoms. No nausea, vomiting, diarrhea, blood in stools,dysuria or flank pain. No GERD, dysphagia, choking spells, polyphagia, polydipsia, heat or cold intolerance, rashes, lymph nodes, bleeding, seizures, TIAs, passing out spells, suicidaly ideation, proximal muscle tenderness, joint effusions. No leg pain,edema, fever or chills.     Physical Exam: BP 122/76   Pulse 74   Wt 96.6 kg (213 lb)   SpO2 96% Comment: room air  BMI 40.25 kg/m  96.6 kg (213  lb) 96% General:  NAD. Able to speak in complete sentences without cough or dyspnea HEENT: Normocephalic, nontraumatic. Extraocular movements intact NECK: Supple. No JVD, nodes, thyromegaly CV: RRR no murmurs, gallops, rubs PULM: Normal respiratory effort, Clear to auscultation bilaterally without wheezing or crackles Abd: Increase abdominal girth EXTREMITIES: No significant edema, cyanosis or Homans'signs SKIN: Fair turgor. No rashes LYMPHATIC: No nodes NEURO: No gross deficits PSYCH: Appropriate affect, alert   Diagnostics: Normal spirometry  Impression: Obstructive sleep apnea on CPAP .  not wearing it each night. Advised to do so  Discussed weight loss at length.  Continue to encourage weight loss.   Continue CPAP on the same settings with humidification  Follow in one year

## 2024-03-02 DIAGNOSIS — N183 Chronic kidney disease, stage 3 unspecified: Secondary | ICD-10-CM | POA: Diagnosis not present

## 2024-03-02 DIAGNOSIS — Z96611 Presence of right artificial shoulder joint: Secondary | ICD-10-CM | POA: Diagnosis not present

## 2024-03-02 DIAGNOSIS — D631 Anemia in chronic kidney disease: Secondary | ICD-10-CM | POA: Diagnosis not present

## 2024-03-02 DIAGNOSIS — Z79891 Long term (current) use of opiate analgesic: Secondary | ICD-10-CM | POA: Diagnosis not present

## 2024-03-02 DIAGNOSIS — S81802D Unspecified open wound, left lower leg, subsequent encounter: Secondary | ICD-10-CM | POA: Diagnosis not present

## 2024-03-02 DIAGNOSIS — Z96641 Presence of right artificial hip joint: Secondary | ICD-10-CM | POA: Diagnosis not present

## 2024-03-02 DIAGNOSIS — J449 Chronic obstructive pulmonary disease, unspecified: Secondary | ICD-10-CM | POA: Diagnosis not present

## 2024-03-02 DIAGNOSIS — Z981 Arthrodesis status: Secondary | ICD-10-CM | POA: Diagnosis not present

## 2024-03-02 DIAGNOSIS — I25119 Atherosclerotic heart disease of native coronary artery with unspecified angina pectoris: Secondary | ICD-10-CM | POA: Diagnosis not present

## 2024-03-02 DIAGNOSIS — S80822D Blister (nonthermal), left lower leg, subsequent encounter: Secondary | ICD-10-CM | POA: Diagnosis not present

## 2024-03-02 DIAGNOSIS — S80821S Blister (nonthermal), right lower leg, sequela: Secondary | ICD-10-CM | POA: Diagnosis not present

## 2024-03-02 DIAGNOSIS — Z9181 History of falling: Secondary | ICD-10-CM | POA: Diagnosis not present

## 2024-03-02 DIAGNOSIS — I130 Hypertensive heart and chronic kidney disease with heart failure and stage 1 through stage 4 chronic kidney disease, or unspecified chronic kidney disease: Secondary | ICD-10-CM | POA: Diagnosis not present

## 2024-03-02 DIAGNOSIS — G4733 Obstructive sleep apnea (adult) (pediatric): Secondary | ICD-10-CM | POA: Diagnosis not present

## 2024-03-02 DIAGNOSIS — Z96612 Presence of left artificial shoulder joint: Secondary | ICD-10-CM | POA: Diagnosis not present

## 2024-03-02 DIAGNOSIS — Z7984 Long term (current) use of oral hypoglycemic drugs: Secondary | ICD-10-CM | POA: Diagnosis not present

## 2024-03-02 DIAGNOSIS — K760 Fatty (change of) liver, not elsewhere classified: Secondary | ICD-10-CM | POA: Diagnosis not present

## 2024-03-02 DIAGNOSIS — S81801D Unspecified open wound, right lower leg, subsequent encounter: Secondary | ICD-10-CM | POA: Diagnosis not present

## 2024-03-02 DIAGNOSIS — L728 Other follicular cysts of the skin and subcutaneous tissue: Secondary | ICD-10-CM | POA: Diagnosis not present

## 2024-03-02 DIAGNOSIS — I872 Venous insufficiency (chronic) (peripheral): Secondary | ICD-10-CM | POA: Diagnosis not present

## 2024-03-02 DIAGNOSIS — M48062 Spinal stenosis, lumbar region with neurogenic claudication: Secondary | ICD-10-CM | POA: Diagnosis not present

## 2024-03-02 DIAGNOSIS — E1122 Type 2 diabetes mellitus with diabetic chronic kidney disease: Secondary | ICD-10-CM | POA: Diagnosis not present

## 2024-03-02 DIAGNOSIS — Z87891 Personal history of nicotine dependence: Secondary | ICD-10-CM | POA: Diagnosis not present

## 2024-03-02 DIAGNOSIS — R339 Retention of urine, unspecified: Secondary | ICD-10-CM | POA: Diagnosis not present

## 2024-03-02 DIAGNOSIS — E7800 Pure hypercholesterolemia, unspecified: Secondary | ICD-10-CM | POA: Diagnosis not present

## 2024-03-02 DIAGNOSIS — I5032 Chronic diastolic (congestive) heart failure: Secondary | ICD-10-CM | POA: Diagnosis not present

## 2024-03-02 DIAGNOSIS — K219 Gastro-esophageal reflux disease without esophagitis: Secondary | ICD-10-CM | POA: Diagnosis not present

## 2024-03-03 DIAGNOSIS — E7800 Pure hypercholesterolemia, unspecified: Secondary | ICD-10-CM | POA: Diagnosis not present

## 2024-03-03 DIAGNOSIS — G4733 Obstructive sleep apnea (adult) (pediatric): Secondary | ICD-10-CM | POA: Diagnosis not present

## 2024-03-03 DIAGNOSIS — Z96611 Presence of right artificial shoulder joint: Secondary | ICD-10-CM | POA: Diagnosis not present

## 2024-03-03 DIAGNOSIS — K219 Gastro-esophageal reflux disease without esophagitis: Secondary | ICD-10-CM | POA: Diagnosis not present

## 2024-03-03 DIAGNOSIS — K760 Fatty (change of) liver, not elsewhere classified: Secondary | ICD-10-CM | POA: Diagnosis not present

## 2024-03-03 DIAGNOSIS — E1122 Type 2 diabetes mellitus with diabetic chronic kidney disease: Secondary | ICD-10-CM | POA: Diagnosis not present

## 2024-03-03 DIAGNOSIS — L728 Other follicular cysts of the skin and subcutaneous tissue: Secondary | ICD-10-CM | POA: Diagnosis not present

## 2024-03-03 DIAGNOSIS — R339 Retention of urine, unspecified: Secondary | ICD-10-CM | POA: Diagnosis not present

## 2024-03-03 DIAGNOSIS — Z7984 Long term (current) use of oral hypoglycemic drugs: Secondary | ICD-10-CM | POA: Diagnosis not present

## 2024-03-03 DIAGNOSIS — D631 Anemia in chronic kidney disease: Secondary | ICD-10-CM | POA: Diagnosis not present

## 2024-03-03 DIAGNOSIS — Z981 Arthrodesis status: Secondary | ICD-10-CM | POA: Diagnosis not present

## 2024-03-03 DIAGNOSIS — N183 Chronic kidney disease, stage 3 unspecified: Secondary | ICD-10-CM | POA: Diagnosis not present

## 2024-03-03 DIAGNOSIS — I872 Venous insufficiency (chronic) (peripheral): Secondary | ICD-10-CM | POA: Diagnosis not present

## 2024-03-03 DIAGNOSIS — Z87891 Personal history of nicotine dependence: Secondary | ICD-10-CM | POA: Diagnosis not present

## 2024-03-03 DIAGNOSIS — I25119 Atherosclerotic heart disease of native coronary artery with unspecified angina pectoris: Secondary | ICD-10-CM | POA: Diagnosis not present

## 2024-03-03 DIAGNOSIS — Z96612 Presence of left artificial shoulder joint: Secondary | ICD-10-CM | POA: Diagnosis not present

## 2024-03-03 DIAGNOSIS — J449 Chronic obstructive pulmonary disease, unspecified: Secondary | ICD-10-CM | POA: Diagnosis not present

## 2024-03-03 DIAGNOSIS — S80821S Blister (nonthermal), right lower leg, sequela: Secondary | ICD-10-CM | POA: Diagnosis not present

## 2024-03-03 DIAGNOSIS — Z79891 Long term (current) use of opiate analgesic: Secondary | ICD-10-CM | POA: Diagnosis not present

## 2024-03-03 DIAGNOSIS — I5032 Chronic diastolic (congestive) heart failure: Secondary | ICD-10-CM | POA: Diagnosis not present

## 2024-03-03 DIAGNOSIS — M48062 Spinal stenosis, lumbar region with neurogenic claudication: Secondary | ICD-10-CM | POA: Diagnosis not present

## 2024-03-03 DIAGNOSIS — S80822D Blister (nonthermal), left lower leg, subsequent encounter: Secondary | ICD-10-CM | POA: Diagnosis not present

## 2024-03-03 DIAGNOSIS — Z96641 Presence of right artificial hip joint: Secondary | ICD-10-CM | POA: Diagnosis not present

## 2024-03-03 DIAGNOSIS — I130 Hypertensive heart and chronic kidney disease with heart failure and stage 1 through stage 4 chronic kidney disease, or unspecified chronic kidney disease: Secondary | ICD-10-CM | POA: Diagnosis not present

## 2024-03-04 DIAGNOSIS — S80822D Blister (nonthermal), left lower leg, subsequent encounter: Secondary | ICD-10-CM | POA: Diagnosis not present

## 2024-03-04 DIAGNOSIS — G4733 Obstructive sleep apnea (adult) (pediatric): Secondary | ICD-10-CM | POA: Diagnosis not present

## 2024-03-04 DIAGNOSIS — E1122 Type 2 diabetes mellitus with diabetic chronic kidney disease: Secondary | ICD-10-CM | POA: Diagnosis not present

## 2024-03-04 DIAGNOSIS — N183 Chronic kidney disease, stage 3 unspecified: Secondary | ICD-10-CM | POA: Diagnosis not present

## 2024-03-04 DIAGNOSIS — Z96611 Presence of right artificial shoulder joint: Secondary | ICD-10-CM | POA: Diagnosis not present

## 2024-03-04 DIAGNOSIS — K760 Fatty (change of) liver, not elsewhere classified: Secondary | ICD-10-CM | POA: Diagnosis not present

## 2024-03-04 DIAGNOSIS — E7800 Pure hypercholesterolemia, unspecified: Secondary | ICD-10-CM | POA: Diagnosis not present

## 2024-03-04 DIAGNOSIS — Z7984 Long term (current) use of oral hypoglycemic drugs: Secondary | ICD-10-CM | POA: Diagnosis not present

## 2024-03-04 DIAGNOSIS — M48062 Spinal stenosis, lumbar region with neurogenic claudication: Secondary | ICD-10-CM | POA: Diagnosis not present

## 2024-03-04 DIAGNOSIS — I130 Hypertensive heart and chronic kidney disease with heart failure and stage 1 through stage 4 chronic kidney disease, or unspecified chronic kidney disease: Secondary | ICD-10-CM | POA: Diagnosis not present

## 2024-03-04 DIAGNOSIS — L728 Other follicular cysts of the skin and subcutaneous tissue: Secondary | ICD-10-CM | POA: Diagnosis not present

## 2024-03-04 DIAGNOSIS — R339 Retention of urine, unspecified: Secondary | ICD-10-CM | POA: Diagnosis not present

## 2024-03-04 DIAGNOSIS — D631 Anemia in chronic kidney disease: Secondary | ICD-10-CM | POA: Diagnosis not present

## 2024-03-04 DIAGNOSIS — I5032 Chronic diastolic (congestive) heart failure: Secondary | ICD-10-CM | POA: Diagnosis not present

## 2024-03-04 DIAGNOSIS — Z981 Arthrodesis status: Secondary | ICD-10-CM | POA: Diagnosis not present

## 2024-03-04 DIAGNOSIS — S80821S Blister (nonthermal), right lower leg, sequela: Secondary | ICD-10-CM | POA: Diagnosis not present

## 2024-03-04 DIAGNOSIS — Z9181 History of falling: Secondary | ICD-10-CM | POA: Diagnosis not present

## 2024-03-04 DIAGNOSIS — I25119 Atherosclerotic heart disease of native coronary artery with unspecified angina pectoris: Secondary | ICD-10-CM | POA: Diagnosis not present

## 2024-03-04 DIAGNOSIS — Z87891 Personal history of nicotine dependence: Secondary | ICD-10-CM | POA: Diagnosis not present

## 2024-03-04 DIAGNOSIS — Z96612 Presence of left artificial shoulder joint: Secondary | ICD-10-CM | POA: Diagnosis not present

## 2024-03-04 DIAGNOSIS — Z79891 Long term (current) use of opiate analgesic: Secondary | ICD-10-CM | POA: Diagnosis not present

## 2024-03-04 DIAGNOSIS — I872 Venous insufficiency (chronic) (peripheral): Secondary | ICD-10-CM | POA: Diagnosis not present

## 2024-03-04 DIAGNOSIS — J449 Chronic obstructive pulmonary disease, unspecified: Secondary | ICD-10-CM | POA: Diagnosis not present

## 2024-03-04 DIAGNOSIS — Z96641 Presence of right artificial hip joint: Secondary | ICD-10-CM | POA: Diagnosis not present

## 2024-03-04 DIAGNOSIS — K219 Gastro-esophageal reflux disease without esophagitis: Secondary | ICD-10-CM | POA: Diagnosis not present

## 2024-03-05 DIAGNOSIS — I890 Lymphedema, not elsewhere classified: Secondary | ICD-10-CM | POA: Diagnosis not present

## 2024-03-06 DIAGNOSIS — Z96641 Presence of right artificial hip joint: Secondary | ICD-10-CM | POA: Diagnosis not present

## 2024-03-06 DIAGNOSIS — J449 Chronic obstructive pulmonary disease, unspecified: Secondary | ICD-10-CM | POA: Diagnosis not present

## 2024-03-06 DIAGNOSIS — D631 Anemia in chronic kidney disease: Secondary | ICD-10-CM | POA: Diagnosis not present

## 2024-03-06 DIAGNOSIS — E7800 Pure hypercholesterolemia, unspecified: Secondary | ICD-10-CM | POA: Diagnosis not present

## 2024-03-06 DIAGNOSIS — Z87891 Personal history of nicotine dependence: Secondary | ICD-10-CM | POA: Diagnosis not present

## 2024-03-06 DIAGNOSIS — Z7984 Long term (current) use of oral hypoglycemic drugs: Secondary | ICD-10-CM | POA: Diagnosis not present

## 2024-03-06 DIAGNOSIS — S80821S Blister (nonthermal), right lower leg, sequela: Secondary | ICD-10-CM | POA: Diagnosis not present

## 2024-03-06 DIAGNOSIS — E1122 Type 2 diabetes mellitus with diabetic chronic kidney disease: Secondary | ICD-10-CM | POA: Diagnosis not present

## 2024-03-06 DIAGNOSIS — Z96612 Presence of left artificial shoulder joint: Secondary | ICD-10-CM | POA: Diagnosis not present

## 2024-03-06 DIAGNOSIS — I25119 Atherosclerotic heart disease of native coronary artery with unspecified angina pectoris: Secondary | ICD-10-CM | POA: Diagnosis not present

## 2024-03-06 DIAGNOSIS — M48062 Spinal stenosis, lumbar region with neurogenic claudication: Secondary | ICD-10-CM | POA: Diagnosis not present

## 2024-03-06 DIAGNOSIS — S80822D Blister (nonthermal), left lower leg, subsequent encounter: Secondary | ICD-10-CM | POA: Diagnosis not present

## 2024-03-06 DIAGNOSIS — N183 Chronic kidney disease, stage 3 unspecified: Secondary | ICD-10-CM | POA: Diagnosis not present

## 2024-03-06 DIAGNOSIS — I130 Hypertensive heart and chronic kidney disease with heart failure and stage 1 through stage 4 chronic kidney disease, or unspecified chronic kidney disease: Secondary | ICD-10-CM | POA: Diagnosis not present

## 2024-03-06 DIAGNOSIS — L728 Other follicular cysts of the skin and subcutaneous tissue: Secondary | ICD-10-CM | POA: Diagnosis not present

## 2024-03-06 DIAGNOSIS — Z9181 History of falling: Secondary | ICD-10-CM | POA: Diagnosis not present

## 2024-03-06 DIAGNOSIS — I5032 Chronic diastolic (congestive) heart failure: Secondary | ICD-10-CM | POA: Diagnosis not present

## 2024-03-06 DIAGNOSIS — G4733 Obstructive sleep apnea (adult) (pediatric): Secondary | ICD-10-CM | POA: Diagnosis not present

## 2024-03-06 DIAGNOSIS — Z96611 Presence of right artificial shoulder joint: Secondary | ICD-10-CM | POA: Diagnosis not present

## 2024-03-06 DIAGNOSIS — K760 Fatty (change of) liver, not elsewhere classified: Secondary | ICD-10-CM | POA: Diagnosis not present

## 2024-03-06 DIAGNOSIS — Z79891 Long term (current) use of opiate analgesic: Secondary | ICD-10-CM | POA: Diagnosis not present

## 2024-03-06 DIAGNOSIS — R339 Retention of urine, unspecified: Secondary | ICD-10-CM | POA: Diagnosis not present

## 2024-03-06 DIAGNOSIS — Z981 Arthrodesis status: Secondary | ICD-10-CM | POA: Diagnosis not present

## 2024-03-06 DIAGNOSIS — K219 Gastro-esophageal reflux disease without esophagitis: Secondary | ICD-10-CM | POA: Diagnosis not present

## 2024-03-06 DIAGNOSIS — I872 Venous insufficiency (chronic) (peripheral): Secondary | ICD-10-CM | POA: Diagnosis not present

## 2024-03-08 DIAGNOSIS — G4733 Obstructive sleep apnea (adult) (pediatric): Secondary | ICD-10-CM | POA: Diagnosis not present

## 2024-03-08 DIAGNOSIS — K760 Fatty (change of) liver, not elsewhere classified: Secondary | ICD-10-CM | POA: Diagnosis not present

## 2024-03-08 DIAGNOSIS — I130 Hypertensive heart and chronic kidney disease with heart failure and stage 1 through stage 4 chronic kidney disease, or unspecified chronic kidney disease: Secondary | ICD-10-CM | POA: Diagnosis not present

## 2024-03-08 DIAGNOSIS — I5032 Chronic diastolic (congestive) heart failure: Secondary | ICD-10-CM | POA: Diagnosis not present

## 2024-03-08 DIAGNOSIS — Z87891 Personal history of nicotine dependence: Secondary | ICD-10-CM | POA: Diagnosis not present

## 2024-03-08 DIAGNOSIS — Z981 Arthrodesis status: Secondary | ICD-10-CM | POA: Diagnosis not present

## 2024-03-08 DIAGNOSIS — N183 Chronic kidney disease, stage 3 unspecified: Secondary | ICD-10-CM | POA: Diagnosis not present

## 2024-03-08 DIAGNOSIS — E7800 Pure hypercholesterolemia, unspecified: Secondary | ICD-10-CM | POA: Diagnosis not present

## 2024-03-08 DIAGNOSIS — I25119 Atherosclerotic heart disease of native coronary artery with unspecified angina pectoris: Secondary | ICD-10-CM | POA: Diagnosis not present

## 2024-03-08 DIAGNOSIS — R339 Retention of urine, unspecified: Secondary | ICD-10-CM | POA: Diagnosis not present

## 2024-03-08 DIAGNOSIS — K219 Gastro-esophageal reflux disease without esophagitis: Secondary | ICD-10-CM | POA: Diagnosis not present

## 2024-03-08 DIAGNOSIS — E1122 Type 2 diabetes mellitus with diabetic chronic kidney disease: Secondary | ICD-10-CM | POA: Diagnosis not present

## 2024-03-08 DIAGNOSIS — Z96611 Presence of right artificial shoulder joint: Secondary | ICD-10-CM | POA: Diagnosis not present

## 2024-03-08 DIAGNOSIS — L728 Other follicular cysts of the skin and subcutaneous tissue: Secondary | ICD-10-CM | POA: Diagnosis not present

## 2024-03-08 DIAGNOSIS — J449 Chronic obstructive pulmonary disease, unspecified: Secondary | ICD-10-CM | POA: Diagnosis not present

## 2024-03-08 DIAGNOSIS — Z79891 Long term (current) use of opiate analgesic: Secondary | ICD-10-CM | POA: Diagnosis not present

## 2024-03-08 DIAGNOSIS — S80822D Blister (nonthermal), left lower leg, subsequent encounter: Secondary | ICD-10-CM | POA: Diagnosis not present

## 2024-03-08 DIAGNOSIS — Z96612 Presence of left artificial shoulder joint: Secondary | ICD-10-CM | POA: Diagnosis not present

## 2024-03-08 DIAGNOSIS — I872 Venous insufficiency (chronic) (peripheral): Secondary | ICD-10-CM | POA: Diagnosis not present

## 2024-03-08 DIAGNOSIS — M48062 Spinal stenosis, lumbar region with neurogenic claudication: Secondary | ICD-10-CM | POA: Diagnosis not present

## 2024-03-08 DIAGNOSIS — D631 Anemia in chronic kidney disease: Secondary | ICD-10-CM | POA: Diagnosis not present

## 2024-03-08 DIAGNOSIS — Z7984 Long term (current) use of oral hypoglycemic drugs: Secondary | ICD-10-CM | POA: Diagnosis not present

## 2024-03-08 DIAGNOSIS — Z96641 Presence of right artificial hip joint: Secondary | ICD-10-CM | POA: Diagnosis not present

## 2024-03-08 DIAGNOSIS — Z9181 History of falling: Secondary | ICD-10-CM | POA: Diagnosis not present

## 2024-03-08 DIAGNOSIS — S80821S Blister (nonthermal), right lower leg, sequela: Secondary | ICD-10-CM | POA: Diagnosis not present

## 2024-03-11 DIAGNOSIS — S80821S Blister (nonthermal), right lower leg, sequela: Secondary | ICD-10-CM | POA: Diagnosis not present

## 2024-03-11 DIAGNOSIS — K219 Gastro-esophageal reflux disease without esophagitis: Secondary | ICD-10-CM | POA: Diagnosis not present

## 2024-03-11 DIAGNOSIS — D631 Anemia in chronic kidney disease: Secondary | ICD-10-CM | POA: Diagnosis not present

## 2024-03-11 DIAGNOSIS — I130 Hypertensive heart and chronic kidney disease with heart failure and stage 1 through stage 4 chronic kidney disease, or unspecified chronic kidney disease: Secondary | ICD-10-CM | POA: Diagnosis not present

## 2024-03-11 DIAGNOSIS — M48062 Spinal stenosis, lumbar region with neurogenic claudication: Secondary | ICD-10-CM | POA: Diagnosis not present

## 2024-03-11 DIAGNOSIS — J449 Chronic obstructive pulmonary disease, unspecified: Secondary | ICD-10-CM | POA: Diagnosis not present

## 2024-03-11 DIAGNOSIS — Z96611 Presence of right artificial shoulder joint: Secondary | ICD-10-CM | POA: Diagnosis not present

## 2024-03-11 DIAGNOSIS — E7800 Pure hypercholesterolemia, unspecified: Secondary | ICD-10-CM | POA: Diagnosis not present

## 2024-03-11 DIAGNOSIS — I25119 Atherosclerotic heart disease of native coronary artery with unspecified angina pectoris: Secondary | ICD-10-CM | POA: Diagnosis not present

## 2024-03-11 DIAGNOSIS — I5032 Chronic diastolic (congestive) heart failure: Secondary | ICD-10-CM | POA: Diagnosis not present

## 2024-03-11 DIAGNOSIS — L728 Other follicular cysts of the skin and subcutaneous tissue: Secondary | ICD-10-CM | POA: Diagnosis not present

## 2024-03-11 DIAGNOSIS — K760 Fatty (change of) liver, not elsewhere classified: Secondary | ICD-10-CM | POA: Diagnosis not present

## 2024-03-11 DIAGNOSIS — Z96612 Presence of left artificial shoulder joint: Secondary | ICD-10-CM | POA: Diagnosis not present

## 2024-03-11 DIAGNOSIS — Z96641 Presence of right artificial hip joint: Secondary | ICD-10-CM | POA: Diagnosis not present

## 2024-03-11 DIAGNOSIS — Z9181 History of falling: Secondary | ICD-10-CM | POA: Diagnosis not present

## 2024-03-11 DIAGNOSIS — Z79891 Long term (current) use of opiate analgesic: Secondary | ICD-10-CM | POA: Diagnosis not present

## 2024-03-11 DIAGNOSIS — Z7984 Long term (current) use of oral hypoglycemic drugs: Secondary | ICD-10-CM | POA: Diagnosis not present

## 2024-03-11 DIAGNOSIS — S80822D Blister (nonthermal), left lower leg, subsequent encounter: Secondary | ICD-10-CM | POA: Diagnosis not present

## 2024-03-11 DIAGNOSIS — N183 Chronic kidney disease, stage 3 unspecified: Secondary | ICD-10-CM | POA: Diagnosis not present

## 2024-03-11 DIAGNOSIS — R339 Retention of urine, unspecified: Secondary | ICD-10-CM | POA: Diagnosis not present

## 2024-03-11 DIAGNOSIS — Z87891 Personal history of nicotine dependence: Secondary | ICD-10-CM | POA: Diagnosis not present

## 2024-03-11 DIAGNOSIS — G4733 Obstructive sleep apnea (adult) (pediatric): Secondary | ICD-10-CM | POA: Diagnosis not present

## 2024-03-11 DIAGNOSIS — E1122 Type 2 diabetes mellitus with diabetic chronic kidney disease: Secondary | ICD-10-CM | POA: Diagnosis not present

## 2024-03-11 DIAGNOSIS — Z981 Arthrodesis status: Secondary | ICD-10-CM | POA: Diagnosis not present

## 2024-03-12 ENCOUNTER — Other Ambulatory Visit: Payer: Self-pay | Admitting: Family Medicine

## 2024-03-13 DIAGNOSIS — M48062 Spinal stenosis, lumbar region with neurogenic claudication: Secondary | ICD-10-CM | POA: Diagnosis not present

## 2024-03-13 DIAGNOSIS — Z7984 Long term (current) use of oral hypoglycemic drugs: Secondary | ICD-10-CM | POA: Diagnosis not present

## 2024-03-13 DIAGNOSIS — Z96641 Presence of right artificial hip joint: Secondary | ICD-10-CM | POA: Diagnosis not present

## 2024-03-13 DIAGNOSIS — D631 Anemia in chronic kidney disease: Secondary | ICD-10-CM | POA: Diagnosis not present

## 2024-03-13 DIAGNOSIS — Z79891 Long term (current) use of opiate analgesic: Secondary | ICD-10-CM | POA: Diagnosis not present

## 2024-03-13 DIAGNOSIS — E1122 Type 2 diabetes mellitus with diabetic chronic kidney disease: Secondary | ICD-10-CM | POA: Diagnosis not present

## 2024-03-13 DIAGNOSIS — Z96611 Presence of right artificial shoulder joint: Secondary | ICD-10-CM | POA: Diagnosis not present

## 2024-03-13 DIAGNOSIS — J449 Chronic obstructive pulmonary disease, unspecified: Secondary | ICD-10-CM | POA: Diagnosis not present

## 2024-03-13 DIAGNOSIS — I25119 Atherosclerotic heart disease of native coronary artery with unspecified angina pectoris: Secondary | ICD-10-CM | POA: Diagnosis not present

## 2024-03-13 DIAGNOSIS — Z981 Arthrodesis status: Secondary | ICD-10-CM | POA: Diagnosis not present

## 2024-03-13 DIAGNOSIS — I872 Venous insufficiency (chronic) (peripheral): Secondary | ICD-10-CM | POA: Diagnosis not present

## 2024-03-13 DIAGNOSIS — E7800 Pure hypercholesterolemia, unspecified: Secondary | ICD-10-CM | POA: Diagnosis not present

## 2024-03-13 DIAGNOSIS — K760 Fatty (change of) liver, not elsewhere classified: Secondary | ICD-10-CM | POA: Diagnosis not present

## 2024-03-13 DIAGNOSIS — Z9181 History of falling: Secondary | ICD-10-CM | POA: Diagnosis not present

## 2024-03-13 DIAGNOSIS — L728 Other follicular cysts of the skin and subcutaneous tissue: Secondary | ICD-10-CM | POA: Diagnosis not present

## 2024-03-13 DIAGNOSIS — Z87891 Personal history of nicotine dependence: Secondary | ICD-10-CM | POA: Diagnosis not present

## 2024-03-13 DIAGNOSIS — S80821S Blister (nonthermal), right lower leg, sequela: Secondary | ICD-10-CM | POA: Diagnosis not present

## 2024-03-13 DIAGNOSIS — S80822D Blister (nonthermal), left lower leg, subsequent encounter: Secondary | ICD-10-CM | POA: Diagnosis not present

## 2024-03-13 DIAGNOSIS — K219 Gastro-esophageal reflux disease without esophagitis: Secondary | ICD-10-CM | POA: Diagnosis not present

## 2024-03-13 DIAGNOSIS — G4733 Obstructive sleep apnea (adult) (pediatric): Secondary | ICD-10-CM | POA: Diagnosis not present

## 2024-03-13 DIAGNOSIS — I130 Hypertensive heart and chronic kidney disease with heart failure and stage 1 through stage 4 chronic kidney disease, or unspecified chronic kidney disease: Secondary | ICD-10-CM | POA: Diagnosis not present

## 2024-03-13 DIAGNOSIS — Z96612 Presence of left artificial shoulder joint: Secondary | ICD-10-CM | POA: Diagnosis not present

## 2024-03-13 DIAGNOSIS — N183 Chronic kidney disease, stage 3 unspecified: Secondary | ICD-10-CM | POA: Diagnosis not present

## 2024-03-13 DIAGNOSIS — I5032 Chronic diastolic (congestive) heart failure: Secondary | ICD-10-CM | POA: Diagnosis not present

## 2024-03-13 DIAGNOSIS — R339 Retention of urine, unspecified: Secondary | ICD-10-CM | POA: Diagnosis not present

## 2024-03-16 DIAGNOSIS — Z96611 Presence of right artificial shoulder joint: Secondary | ICD-10-CM | POA: Diagnosis not present

## 2024-03-16 DIAGNOSIS — M48062 Spinal stenosis, lumbar region with neurogenic claudication: Secondary | ICD-10-CM | POA: Diagnosis not present

## 2024-03-16 DIAGNOSIS — Z96612 Presence of left artificial shoulder joint: Secondary | ICD-10-CM | POA: Diagnosis not present

## 2024-03-16 DIAGNOSIS — I872 Venous insufficiency (chronic) (peripheral): Secondary | ICD-10-CM | POA: Diagnosis not present

## 2024-03-16 DIAGNOSIS — N183 Chronic kidney disease, stage 3 unspecified: Secondary | ICD-10-CM | POA: Diagnosis not present

## 2024-03-16 DIAGNOSIS — G4733 Obstructive sleep apnea (adult) (pediatric): Secondary | ICD-10-CM | POA: Diagnosis not present

## 2024-03-16 DIAGNOSIS — I130 Hypertensive heart and chronic kidney disease with heart failure and stage 1 through stage 4 chronic kidney disease, or unspecified chronic kidney disease: Secondary | ICD-10-CM | POA: Diagnosis not present

## 2024-03-16 DIAGNOSIS — Z79891 Long term (current) use of opiate analgesic: Secondary | ICD-10-CM | POA: Diagnosis not present

## 2024-03-16 DIAGNOSIS — J449 Chronic obstructive pulmonary disease, unspecified: Secondary | ICD-10-CM | POA: Diagnosis not present

## 2024-03-16 DIAGNOSIS — K760 Fatty (change of) liver, not elsewhere classified: Secondary | ICD-10-CM | POA: Diagnosis not present

## 2024-03-16 DIAGNOSIS — I25119 Atherosclerotic heart disease of native coronary artery with unspecified angina pectoris: Secondary | ICD-10-CM | POA: Diagnosis not present

## 2024-03-16 DIAGNOSIS — Z7984 Long term (current) use of oral hypoglycemic drugs: Secondary | ICD-10-CM | POA: Diagnosis not present

## 2024-03-16 DIAGNOSIS — Z9181 History of falling: Secondary | ICD-10-CM | POA: Diagnosis not present

## 2024-03-16 DIAGNOSIS — L728 Other follicular cysts of the skin and subcutaneous tissue: Secondary | ICD-10-CM | POA: Diagnosis not present

## 2024-03-16 DIAGNOSIS — D631 Anemia in chronic kidney disease: Secondary | ICD-10-CM | POA: Diagnosis not present

## 2024-03-16 DIAGNOSIS — E7800 Pure hypercholesterolemia, unspecified: Secondary | ICD-10-CM | POA: Diagnosis not present

## 2024-03-16 DIAGNOSIS — Z87891 Personal history of nicotine dependence: Secondary | ICD-10-CM | POA: Diagnosis not present

## 2024-03-16 DIAGNOSIS — Z981 Arthrodesis status: Secondary | ICD-10-CM | POA: Diagnosis not present

## 2024-03-16 DIAGNOSIS — K219 Gastro-esophageal reflux disease without esophagitis: Secondary | ICD-10-CM | POA: Diagnosis not present

## 2024-03-16 DIAGNOSIS — S80821S Blister (nonthermal), right lower leg, sequela: Secondary | ICD-10-CM | POA: Diagnosis not present

## 2024-03-16 DIAGNOSIS — R339 Retention of urine, unspecified: Secondary | ICD-10-CM | POA: Diagnosis not present

## 2024-03-16 DIAGNOSIS — S80822D Blister (nonthermal), left lower leg, subsequent encounter: Secondary | ICD-10-CM | POA: Diagnosis not present

## 2024-03-16 DIAGNOSIS — Z96641 Presence of right artificial hip joint: Secondary | ICD-10-CM | POA: Diagnosis not present

## 2024-03-16 DIAGNOSIS — I5032 Chronic diastolic (congestive) heart failure: Secondary | ICD-10-CM | POA: Diagnosis not present

## 2024-03-16 DIAGNOSIS — E1122 Type 2 diabetes mellitus with diabetic chronic kidney disease: Secondary | ICD-10-CM | POA: Diagnosis not present

## 2024-03-18 ENCOUNTER — Telehealth: Payer: Self-pay

## 2024-03-18 DIAGNOSIS — I872 Venous insufficiency (chronic) (peripheral): Secondary | ICD-10-CM | POA: Diagnosis not present

## 2024-03-18 DIAGNOSIS — Z981 Arthrodesis status: Secondary | ICD-10-CM | POA: Diagnosis not present

## 2024-03-18 DIAGNOSIS — G4733 Obstructive sleep apnea (adult) (pediatric): Secondary | ICD-10-CM | POA: Diagnosis not present

## 2024-03-18 DIAGNOSIS — I25119 Atherosclerotic heart disease of native coronary artery with unspecified angina pectoris: Secondary | ICD-10-CM | POA: Diagnosis not present

## 2024-03-18 DIAGNOSIS — Z96612 Presence of left artificial shoulder joint: Secondary | ICD-10-CM | POA: Diagnosis not present

## 2024-03-18 DIAGNOSIS — N183 Chronic kidney disease, stage 3 unspecified: Secondary | ICD-10-CM | POA: Diagnosis not present

## 2024-03-18 DIAGNOSIS — K760 Fatty (change of) liver, not elsewhere classified: Secondary | ICD-10-CM | POA: Diagnosis not present

## 2024-03-18 DIAGNOSIS — M48062 Spinal stenosis, lumbar region with neurogenic claudication: Secondary | ICD-10-CM | POA: Diagnosis not present

## 2024-03-18 DIAGNOSIS — J449 Chronic obstructive pulmonary disease, unspecified: Secondary | ICD-10-CM | POA: Diagnosis not present

## 2024-03-18 DIAGNOSIS — Z7984 Long term (current) use of oral hypoglycemic drugs: Secondary | ICD-10-CM | POA: Diagnosis not present

## 2024-03-18 DIAGNOSIS — Z79891 Long term (current) use of opiate analgesic: Secondary | ICD-10-CM | POA: Diagnosis not present

## 2024-03-18 DIAGNOSIS — S80821S Blister (nonthermal), right lower leg, sequela: Secondary | ICD-10-CM | POA: Diagnosis not present

## 2024-03-18 DIAGNOSIS — I5032 Chronic diastolic (congestive) heart failure: Secondary | ICD-10-CM | POA: Diagnosis not present

## 2024-03-18 DIAGNOSIS — Z87891 Personal history of nicotine dependence: Secondary | ICD-10-CM | POA: Diagnosis not present

## 2024-03-18 DIAGNOSIS — L728 Other follicular cysts of the skin and subcutaneous tissue: Secondary | ICD-10-CM | POA: Diagnosis not present

## 2024-03-18 DIAGNOSIS — E1122 Type 2 diabetes mellitus with diabetic chronic kidney disease: Secondary | ICD-10-CM | POA: Diagnosis not present

## 2024-03-18 DIAGNOSIS — E7800 Pure hypercholesterolemia, unspecified: Secondary | ICD-10-CM | POA: Diagnosis not present

## 2024-03-18 DIAGNOSIS — S80822D Blister (nonthermal), left lower leg, subsequent encounter: Secondary | ICD-10-CM | POA: Diagnosis not present

## 2024-03-18 DIAGNOSIS — K219 Gastro-esophageal reflux disease without esophagitis: Secondary | ICD-10-CM | POA: Diagnosis not present

## 2024-03-18 DIAGNOSIS — R339 Retention of urine, unspecified: Secondary | ICD-10-CM | POA: Diagnosis not present

## 2024-03-18 DIAGNOSIS — Z96641 Presence of right artificial hip joint: Secondary | ICD-10-CM | POA: Diagnosis not present

## 2024-03-18 DIAGNOSIS — Z9181 History of falling: Secondary | ICD-10-CM | POA: Diagnosis not present

## 2024-03-18 DIAGNOSIS — D631 Anemia in chronic kidney disease: Secondary | ICD-10-CM | POA: Diagnosis not present

## 2024-03-18 DIAGNOSIS — I130 Hypertensive heart and chronic kidney disease with heart failure and stage 1 through stage 4 chronic kidney disease, or unspecified chronic kidney disease: Secondary | ICD-10-CM | POA: Diagnosis not present

## 2024-03-18 DIAGNOSIS — Z96611 Presence of right artificial shoulder joint: Secondary | ICD-10-CM | POA: Diagnosis not present

## 2024-03-18 NOTE — Progress Notes (Signed)
 Complex Care Management Note  Care Guide Note 03/18/2024 Name: Mark Terry. MRN: [id number] DOB: Jul 19, 1934  Mark LELON Terald Jump. is a 89 y.o. year old male who sees Fisher, Nancyann BRAVO, MD for primary care. I reached out to Mark Terry. by phone today to offer complex care management services.  Mark Terry was given information about Complex Care Management services today including:   The Complex Care Management services include support from the care team which includes your Nurse Care Manager, Clinical Social Worker, or Pharmacist.  The Complex Care Management team is here to help remove barriers to the health concerns and goals most important to you. Complex Care Management services are voluntary, and the patient may decline or stop services at any time by request to their care team member.   Complex Care Management Consent Status: Patient agreed to services and verbal consent obtained.   Follow up plan:  Telephone appointment with complex care management team member scheduled for:  04/06/24 at 10:00 a.m.   Encounter Outcome:  Patient Scheduled  Dreama [facility]  [facility], [facility] VBCI Assistant Direct Dial: [phone]  Fax: [phone]

## 2024-03-18 NOTE — Progress Notes (Signed)
 Complex Care Management Note Care Guide Note  03/18/2024 Name: Mark Terry. MRN: [id number] DOB: 01/12/1935   Complex Care Management Outreach Attempts: An unsuccessful telephone outreach was attempted today to offer the patient information about available complex care management services.  Follow Up Plan:  Additional outreach attempts will be made to offer the patient complex care management information and services.   Encounter Outcome:  No Answer  Dreama [facility]wood [facility]  [facility], [facility] VBCI Assistant Direct Dial: [phone]  Fax: [phone]

## 2024-03-20 ENCOUNTER — Ambulatory Visit: Admitting: Family Medicine

## 2024-03-20 DIAGNOSIS — Z7984 Long term (current) use of oral hypoglycemic drugs: Secondary | ICD-10-CM | POA: Diagnosis not present

## 2024-03-20 DIAGNOSIS — Z96641 Presence of right artificial hip joint: Secondary | ICD-10-CM | POA: Diagnosis not present

## 2024-03-20 DIAGNOSIS — Z9181 History of falling: Secondary | ICD-10-CM | POA: Diagnosis not present

## 2024-03-20 DIAGNOSIS — E1122 Type 2 diabetes mellitus with diabetic chronic kidney disease: Secondary | ICD-10-CM | POA: Diagnosis not present

## 2024-03-20 DIAGNOSIS — M48062 Spinal stenosis, lumbar region with neurogenic claudication: Secondary | ICD-10-CM | POA: Diagnosis not present

## 2024-03-20 DIAGNOSIS — Z96612 Presence of left artificial shoulder joint: Secondary | ICD-10-CM | POA: Diagnosis not present

## 2024-03-20 DIAGNOSIS — K760 Fatty (change of) liver, not elsewhere classified: Secondary | ICD-10-CM | POA: Diagnosis not present

## 2024-03-20 DIAGNOSIS — Z96611 Presence of right artificial shoulder joint: Secondary | ICD-10-CM | POA: Diagnosis not present

## 2024-03-20 DIAGNOSIS — S80821S Blister (nonthermal), right lower leg, sequela: Secondary | ICD-10-CM | POA: Diagnosis not present

## 2024-03-20 DIAGNOSIS — J449 Chronic obstructive pulmonary disease, unspecified: Secondary | ICD-10-CM | POA: Diagnosis not present

## 2024-03-20 DIAGNOSIS — D631 Anemia in chronic kidney disease: Secondary | ICD-10-CM | POA: Diagnosis not present

## 2024-03-20 DIAGNOSIS — L728 Other follicular cysts of the skin and subcutaneous tissue: Secondary | ICD-10-CM | POA: Diagnosis not present

## 2024-03-20 DIAGNOSIS — I872 Venous insufficiency (chronic) (peripheral): Secondary | ICD-10-CM | POA: Diagnosis not present

## 2024-03-20 DIAGNOSIS — G4733 Obstructive sleep apnea (adult) (pediatric): Secondary | ICD-10-CM | POA: Diagnosis not present

## 2024-03-20 DIAGNOSIS — R339 Retention of urine, unspecified: Secondary | ICD-10-CM | POA: Diagnosis not present

## 2024-03-20 DIAGNOSIS — I5032 Chronic diastolic (congestive) heart failure: Secondary | ICD-10-CM | POA: Diagnosis not present

## 2024-03-20 DIAGNOSIS — N183 Chronic kidney disease, stage 3 unspecified: Secondary | ICD-10-CM | POA: Diagnosis not present

## 2024-03-20 DIAGNOSIS — E7800 Pure hypercholesterolemia, unspecified: Secondary | ICD-10-CM | POA: Diagnosis not present

## 2024-03-20 DIAGNOSIS — S80822D Blister (nonthermal), left lower leg, subsequent encounter: Secondary | ICD-10-CM | POA: Diagnosis not present

## 2024-03-20 DIAGNOSIS — Z981 Arthrodesis status: Secondary | ICD-10-CM | POA: Diagnosis not present

## 2024-03-20 DIAGNOSIS — I25119 Atherosclerotic heart disease of native coronary artery with unspecified angina pectoris: Secondary | ICD-10-CM | POA: Diagnosis not present

## 2024-03-20 DIAGNOSIS — Z87891 Personal history of nicotine dependence: Secondary | ICD-10-CM | POA: Diagnosis not present

## 2024-03-20 DIAGNOSIS — I130 Hypertensive heart and chronic kidney disease with heart failure and stage 1 through stage 4 chronic kidney disease, or unspecified chronic kidney disease: Secondary | ICD-10-CM | POA: Diagnosis not present

## 2024-03-20 DIAGNOSIS — Z79891 Long term (current) use of opiate analgesic: Secondary | ICD-10-CM | POA: Diagnosis not present

## 2024-03-20 DIAGNOSIS — K219 Gastro-esophageal reflux disease without esophagitis: Secondary | ICD-10-CM | POA: Diagnosis not present

## 2024-03-23 ENCOUNTER — Encounter: Payer: Self-pay | Admitting: Family Medicine

## 2024-03-23 ENCOUNTER — Ambulatory Visit: Admitting: Family Medicine

## 2024-03-23 VITALS — BP 126/64 | HR 74 | Resp 16 | Ht 61.0 in | Wt 205.0 lb

## 2024-03-23 DIAGNOSIS — R339 Retention of urine, unspecified: Secondary | ICD-10-CM | POA: Diagnosis not present

## 2024-03-23 DIAGNOSIS — J449 Chronic obstructive pulmonary disease, unspecified: Secondary | ICD-10-CM | POA: Diagnosis not present

## 2024-03-23 DIAGNOSIS — S80821S Blister (nonthermal), right lower leg, sequela: Secondary | ICD-10-CM | POA: Diagnosis not present

## 2024-03-23 DIAGNOSIS — S81801D Unspecified open wound, right lower leg, subsequent encounter: Secondary | ICD-10-CM | POA: Diagnosis not present

## 2024-03-23 DIAGNOSIS — Z96612 Presence of left artificial shoulder joint: Secondary | ICD-10-CM | POA: Diagnosis not present

## 2024-03-23 DIAGNOSIS — L84 Corns and callosities: Secondary | ICD-10-CM

## 2024-03-23 DIAGNOSIS — E1140 Type 2 diabetes mellitus with diabetic neuropathy, unspecified: Secondary | ICD-10-CM | POA: Diagnosis not present

## 2024-03-23 DIAGNOSIS — E7800 Pure hypercholesterolemia, unspecified: Secondary | ICD-10-CM | POA: Diagnosis not present

## 2024-03-23 DIAGNOSIS — M48062 Spinal stenosis, lumbar region with neurogenic claudication: Secondary | ICD-10-CM | POA: Diagnosis not present

## 2024-03-23 DIAGNOSIS — Z79891 Long term (current) use of opiate analgesic: Secondary | ICD-10-CM | POA: Diagnosis not present

## 2024-03-23 DIAGNOSIS — Z7984 Long term (current) use of oral hypoglycemic drugs: Secondary | ICD-10-CM

## 2024-03-23 DIAGNOSIS — N183 Chronic kidney disease, stage 3 unspecified: Secondary | ICD-10-CM | POA: Diagnosis not present

## 2024-03-23 DIAGNOSIS — Z981 Arthrodesis status: Secondary | ICD-10-CM | POA: Diagnosis not present

## 2024-03-23 DIAGNOSIS — Z23 Encounter for immunization: Secondary | ICD-10-CM | POA: Diagnosis not present

## 2024-03-23 DIAGNOSIS — I872 Venous insufficiency (chronic) (peripheral): Secondary | ICD-10-CM | POA: Diagnosis not present

## 2024-03-23 DIAGNOSIS — K219 Gastro-esophageal reflux disease without esophagitis: Secondary | ICD-10-CM | POA: Diagnosis not present

## 2024-03-23 DIAGNOSIS — I5032 Chronic diastolic (congestive) heart failure: Secondary | ICD-10-CM | POA: Diagnosis not present

## 2024-03-23 DIAGNOSIS — Z9181 History of falling: Secondary | ICD-10-CM | POA: Diagnosis not present

## 2024-03-23 DIAGNOSIS — I25119 Atherosclerotic heart disease of native coronary artery with unspecified angina pectoris: Secondary | ICD-10-CM | POA: Diagnosis not present

## 2024-03-23 DIAGNOSIS — Z96641 Presence of right artificial hip joint: Secondary | ICD-10-CM | POA: Diagnosis not present

## 2024-03-23 DIAGNOSIS — Z96611 Presence of right artificial shoulder joint: Secondary | ICD-10-CM | POA: Diagnosis not present

## 2024-03-23 DIAGNOSIS — L728 Other follicular cysts of the skin and subcutaneous tissue: Secondary | ICD-10-CM | POA: Diagnosis not present

## 2024-03-23 DIAGNOSIS — S81802D Unspecified open wound, left lower leg, subsequent encounter: Secondary | ICD-10-CM

## 2024-03-23 DIAGNOSIS — Z87891 Personal history of nicotine dependence: Secondary | ICD-10-CM | POA: Diagnosis not present

## 2024-03-23 DIAGNOSIS — E1122 Type 2 diabetes mellitus with diabetic chronic kidney disease: Secondary | ICD-10-CM | POA: Diagnosis not present

## 2024-03-23 DIAGNOSIS — I130 Hypertensive heart and chronic kidney disease with heart failure and stage 1 through stage 4 chronic kidney disease, or unspecified chronic kidney disease: Secondary | ICD-10-CM | POA: Diagnosis not present

## 2024-03-23 DIAGNOSIS — S80822D Blister (nonthermal), left lower leg, subsequent encounter: Secondary | ICD-10-CM | POA: Diagnosis not present

## 2024-03-23 DIAGNOSIS — K760 Fatty (change of) liver, not elsewhere classified: Secondary | ICD-10-CM | POA: Diagnosis not present

## 2024-03-23 DIAGNOSIS — D631 Anemia in chronic kidney disease: Secondary | ICD-10-CM | POA: Diagnosis not present

## 2024-03-23 DIAGNOSIS — G4733 Obstructive sleep apnea (adult) (pediatric): Secondary | ICD-10-CM | POA: Diagnosis not present

## 2024-03-23 NOTE — Progress Notes (Signed)
 Established patient visit   Patient: Mark Terry.   DOB: 1934-08-22   89 y.o. Male  MRN: [id number] Visit Date: 03/23/2024  Today's healthcare provider: Nancyann Perry, MD   Chief Complaint  Patient presents with   Follow-up    F/u pt wants to discuss Diabetic shoes   Subjective    Discussed the use of AI scribe software for clinical note transcription with the patient, who gave verbal consent to proceed.  History of Present Illness   Mark Terry. is an 89 year old male with diabetes and peripheral neuropathy who presents for follow-up on leg wounds and diabetic foot care.  His leg wounds have healed, but he is concerned about insurance coverage for wound care supplies, fearing recurrence. A small area on one leg still requires attention.  He uses a Velcro wrap for pressure and has difficulty applying compression hose himself.  He is currently taking vitamin D  and a multivitamin and inquires about the necessity of continuing vitamin C , which was recommended upon hospital discharge.  He experiences numbness and tingling in his feet, with a history of blisters and sores that have healed, except for one on his right heel, which is being managed by a podiatrist at the [facility]. He is considering diabetic shoes and has some numbness in his fingers, for which he inquired about a possible connection to his diabetes.  He reports significant pain in his hands, exacerbated by air conditioning, and mentions a history of a pinched nerve in his neck, which did not improve with cortisone shots.  No pain or swelling in his wrists.     Lab Results  Component Value Date   HGBA1C 7.5 (A) 01/15/2024   HGBA1C 7.9 (H) 09/25/2023   HGBA1C 7.8 (H) 06/28/2023      Medications: Outpatient Medications Prior to Visit  Medication Sig   ACCU-CHEK GUIDE TEST test strip CHECK BLOOD SUGAR ONCE DAILY FOR TYPE 2 DIABETES   alfuzosin  (UROXATRAL ) 10 MG 24 hr tablet Take 1 tablet  (10 mg total) by mouth daily with breakfast. Take in place of doxazosin    ascorbic acid  (VITAMIN C ) 500 MG tablet Take 1 tablet (500 mg total) by mouth 2 (two) times daily.   aspirin  81 MG tablet Take 81 mg by mouth daily.   DULoxetine  (CYMBALTA ) 30 MG capsule TAKE 1 CAPSULE BY MOUTH EVERY DAY   [Paused] empagliflozin  (JARDIANCE ) 25 MG TABS tablet TAKE 1 TABLET BY MOUTH DAILY   empagliflozin  (JARDIANCE ) 25 MG TABS tablet Take 25 mg by mouth daily.   feeding supplement (ENSURE PLUS HIGH PROTEIN) LIQD Take 237 mLs by mouth 2 (two) times daily between meals.   fluticasone  (FLONASE ) 50 MCG/ACT nasal spray USE 1 TO 2 SPRAYS IN BOTH  NOSTRILS DAILY   [Paused] furosemide  (LASIX ) 40 MG tablet TAKE 1 TABLET BY MOUTH DAILY (Patient taking differently: TAKE 1 TABLET BY MOUTH DAILY)   gabapentin  (NEURONTIN ) 300 MG capsule TAKE 1 CAPSULE BY MOUTH 3 TIMES  DAILY   glimepiride  (AMARYL ) 1 MG tablet TAKE 1 TABLET BY MOUTH DAILY   HYDROcodone -acetaminophen  (NORCO) 7.5-325 MG tablet Take 1 tablet by mouth 2 (two) times daily.   HYDROcodone -acetaminophen  (NORCO/VICODIN) 5-325 MG tablet TAKE 1 TABLET BY MOUTH TWICE DAILY AS NEEDED   levothyroxine  (SYNTHROID ) 50 MCG tablet TAKE 1 TABLET BY MOUTH DAILY   loratadine  (CLARITIN ) 10 MG tablet Take 10 mg by mouth daily.   losartan  (COZAAR ) 25 MG tablet Take 25 mg  by mouth daily.   metoprolol  tartrate (LOPRESSOR ) 25 MG tablet TAKE 1 TABLET BY MOUTH TWICE  DAILY   Multiple Vitamin (MULTIVITAMIN WITH MINERALS) TABS Take 1 tablet by mouth daily.   omeprazole  (PRILOSEC) 40 MG capsule TAKE 1 CAPSULE BY MOUTH DAILY   oxybutynin  (DITROPAN ) 5 MG tablet TAKE 1 TABLET BY MOUTH TWICE  DAILY   simvastatin  (ZOCOR ) 40 MG tablet TAKE 1 TABLET BY MOUTH AT  BEDTIME   spironolactone (ALDACTONE) 25 MG tablet Take 25 mg by mouth.   traZODone  (DESYREL ) 100 MG tablet TAKE 1/2 TO 1 TABLET BY MOUTH AT BEDTIME AS NEEDED. FOR SLEEP   Vitamin D , Ergocalciferol , (DRISDOL ) 1.25 MG (50000 UNIT) CAPS  capsule Take 50,000 Units by mouth every 7 (seven) days.   No facility-administered medications prior to visit.   Review of Systems  Constitutional:  Negative for appetite change, chills and fever.  Respiratory:  Negative for chest tightness, shortness of breath and wheezing.   Cardiovascular:  Negative for chest pain and palpitations.  Gastrointestinal:  Negative for abdominal pain, nausea and vomiting.       Objective    BP 126/64 (BP Location: Right Arm, Patient Position: Sitting, Cuff Size: Normal)   Pulse 74   Resp 16   Ht 5' 1 (1.549 m)   Wt 205 lb (93 kg)   SpO2 98%   BMI 38.73 kg/m   Physical Exam   General: Appearance:    Obese male sitting comfortably in wheelchair in no acute distress  Eyes:    PERRL, conjunctiva/corneas clear, EOM's intact       Lungs:     Clear to auscultation bilaterally, respirations unlabored  Heart:    Normal heart rate. Normal rhythm. No murmurs, rubs, or gallops.    MS:   All extremities are intact.  Diminished s/s both feet with pre-ulcerative callus under right heel.   Neurologic:   Awake, alert, oriented x 3. No apparent focal neurological defect.           Assessment & Plan     1. Type 2 diabetes mellitus with diabetic neuropathy, without long-term current use of insulin  (HCC) (Primary)   2. Pre-ulcerative Callus of foot  Order for diabetic shoes faxed to [facility].   3. Open wound of both lower extremities, subsequent encounter Doing much better with regular [employer] changes via home health. Anticipate regular use of compression stockings once lesions have healed.   4. Need for influenza vaccination  - Flu vaccine trivalent PF, 6mos and older(Flulaval,Afluria,Fluarix,Fluzone)  Other orders - spironolactone (ALDACTONE) 25 MG tablet; Take 25 mg by mouth. - losartan  (COZAAR ) 25 MG tablet; Take 25 mg by mouth daily. - empagliflozin  (JARDIANCE ) 25 MG TABS tablet; Take 25 mg by mouth daily.   Return in about 12 weeks  (around 06/15/2024).     Nancyann Perry, MD  [facility] [phone] (phone) [phone] (fax)  [facility]

## 2024-03-23 NOTE — Patient Instructions (Signed)
 SABRA  Please review the attached list of medications and notify my office if there are any errors.   . Please bring all of your medications to every appointment so we can make sure that our medication list is the same as yours.

## 2024-03-25 DIAGNOSIS — G4733 Obstructive sleep apnea (adult) (pediatric): Secondary | ICD-10-CM | POA: Diagnosis not present

## 2024-03-25 DIAGNOSIS — Z981 Arthrodesis status: Secondary | ICD-10-CM | POA: Diagnosis not present

## 2024-03-25 DIAGNOSIS — I872 Venous insufficiency (chronic) (peripheral): Secondary | ICD-10-CM | POA: Diagnosis not present

## 2024-03-25 DIAGNOSIS — J449 Chronic obstructive pulmonary disease, unspecified: Secondary | ICD-10-CM | POA: Diagnosis not present

## 2024-03-25 DIAGNOSIS — I25119 Atherosclerotic heart disease of native coronary artery with unspecified angina pectoris: Secondary | ICD-10-CM | POA: Diagnosis not present

## 2024-03-25 DIAGNOSIS — K219 Gastro-esophageal reflux disease without esophagitis: Secondary | ICD-10-CM | POA: Diagnosis not present

## 2024-03-25 DIAGNOSIS — Z96612 Presence of left artificial shoulder joint: Secondary | ICD-10-CM | POA: Diagnosis not present

## 2024-03-25 DIAGNOSIS — Z96611 Presence of right artificial shoulder joint: Secondary | ICD-10-CM | POA: Diagnosis not present

## 2024-03-25 DIAGNOSIS — E7800 Pure hypercholesterolemia, unspecified: Secondary | ICD-10-CM | POA: Diagnosis not present

## 2024-03-25 DIAGNOSIS — Z96641 Presence of right artificial hip joint: Secondary | ICD-10-CM | POA: Diagnosis not present

## 2024-03-25 DIAGNOSIS — Z87891 Personal history of nicotine dependence: Secondary | ICD-10-CM | POA: Diagnosis not present

## 2024-03-25 DIAGNOSIS — Z79891 Long term (current) use of opiate analgesic: Secondary | ICD-10-CM | POA: Diagnosis not present

## 2024-03-25 DIAGNOSIS — Z9181 History of falling: Secondary | ICD-10-CM | POA: Diagnosis not present

## 2024-03-25 DIAGNOSIS — Z7984 Long term (current) use of oral hypoglycemic drugs: Secondary | ICD-10-CM | POA: Diagnosis not present

## 2024-03-25 DIAGNOSIS — I5032 Chronic diastolic (congestive) heart failure: Secondary | ICD-10-CM | POA: Diagnosis not present

## 2024-03-25 DIAGNOSIS — D631 Anemia in chronic kidney disease: Secondary | ICD-10-CM | POA: Diagnosis not present

## 2024-03-25 DIAGNOSIS — L728 Other follicular cysts of the skin and subcutaneous tissue: Secondary | ICD-10-CM | POA: Diagnosis not present

## 2024-03-25 DIAGNOSIS — S80822D Blister (nonthermal), left lower leg, subsequent encounter: Secondary | ICD-10-CM | POA: Diagnosis not present

## 2024-03-25 DIAGNOSIS — M48062 Spinal stenosis, lumbar region with neurogenic claudication: Secondary | ICD-10-CM | POA: Diagnosis not present

## 2024-03-25 DIAGNOSIS — K760 Fatty (change of) liver, not elsewhere classified: Secondary | ICD-10-CM | POA: Diagnosis not present

## 2024-03-25 DIAGNOSIS — R339 Retention of urine, unspecified: Secondary | ICD-10-CM | POA: Diagnosis not present

## 2024-03-25 DIAGNOSIS — S80821S Blister (nonthermal), right lower leg, sequela: Secondary | ICD-10-CM | POA: Diagnosis not present

## 2024-03-25 DIAGNOSIS — N183 Chronic kidney disease, stage 3 unspecified: Secondary | ICD-10-CM | POA: Diagnosis not present

## 2024-03-25 DIAGNOSIS — I130 Hypertensive heart and chronic kidney disease with heart failure and stage 1 through stage 4 chronic kidney disease, or unspecified chronic kidney disease: Secondary | ICD-10-CM | POA: Diagnosis not present

## 2024-03-25 DIAGNOSIS — E1122 Type 2 diabetes mellitus with diabetic chronic kidney disease: Secondary | ICD-10-CM | POA: Diagnosis not present

## 2024-03-26 ENCOUNTER — Encounter: Attending: Physician Assistant | Admitting: Physician Assistant

## 2024-03-26 DIAGNOSIS — L97822 Non-pressure chronic ulcer of other part of left lower leg with fat layer exposed: Secondary | ICD-10-CM | POA: Diagnosis not present

## 2024-03-26 DIAGNOSIS — I2510 Atherosclerotic heart disease of native coronary artery without angina pectoris: Secondary | ICD-10-CM | POA: Insufficient documentation

## 2024-03-26 DIAGNOSIS — R809 Proteinuria, unspecified: Secondary | ICD-10-CM | POA: Diagnosis not present

## 2024-03-26 DIAGNOSIS — E11621 Type 2 diabetes mellitus with foot ulcer: Secondary | ICD-10-CM | POA: Diagnosis not present

## 2024-03-26 DIAGNOSIS — I5042 Chronic combined systolic (congestive) and diastolic (congestive) heart failure: Secondary | ICD-10-CM | POA: Insufficient documentation

## 2024-03-26 DIAGNOSIS — I87333 Chronic venous hypertension (idiopathic) with ulcer and inflammation of bilateral lower extremity: Secondary | ICD-10-CM | POA: Diagnosis not present

## 2024-03-26 DIAGNOSIS — L97522 Non-pressure chronic ulcer of other part of left foot with fat layer exposed: Secondary | ICD-10-CM | POA: Insufficient documentation

## 2024-03-26 DIAGNOSIS — E1142 Type 2 diabetes mellitus with diabetic polyneuropathy: Secondary | ICD-10-CM | POA: Diagnosis not present

## 2024-03-26 DIAGNOSIS — L98422 Non-pressure chronic ulcer of back with fat layer exposed: Secondary | ICD-10-CM | POA: Diagnosis not present

## 2024-03-26 DIAGNOSIS — I130 Hypertensive heart and chronic kidney disease with heart failure and stage 1 through stage 4 chronic kidney disease, or unspecified chronic kidney disease: Secondary | ICD-10-CM | POA: Diagnosis not present

## 2024-03-26 DIAGNOSIS — N184 Chronic kidney disease, stage 4 (severe): Secondary | ICD-10-CM | POA: Diagnosis not present

## 2024-03-26 DIAGNOSIS — E1122 Type 2 diabetes mellitus with diabetic chronic kidney disease: Secondary | ICD-10-CM | POA: Diagnosis not present

## 2024-03-26 DIAGNOSIS — R319 Hematuria, unspecified: Secondary | ICD-10-CM | POA: Diagnosis not present

## 2024-03-26 DIAGNOSIS — R296 Repeated falls: Secondary | ICD-10-CM | POA: Insufficient documentation

## 2024-03-26 DIAGNOSIS — L988 Other specified disorders of the skin and subcutaneous tissue: Secondary | ICD-10-CM | POA: Insufficient documentation

## 2024-03-26 DIAGNOSIS — E785 Hyperlipidemia, unspecified: Secondary | ICD-10-CM | POA: Diagnosis not present

## 2024-03-26 DIAGNOSIS — I890 Lymphedema, not elsewhere classified: Secondary | ICD-10-CM | POA: Diagnosis not present

## 2024-03-26 DIAGNOSIS — L97412 Non-pressure chronic ulcer of right heel and midfoot with fat layer exposed: Secondary | ICD-10-CM | POA: Diagnosis not present

## 2024-03-26 DIAGNOSIS — L729 Follicular cyst of the skin and subcutaneous tissue, unspecified: Secondary | ICD-10-CM | POA: Diagnosis not present

## 2024-03-26 DIAGNOSIS — L97312 Non-pressure chronic ulcer of right ankle with fat layer exposed: Secondary | ICD-10-CM | POA: Insufficient documentation

## 2024-03-26 DIAGNOSIS — I509 Heart failure, unspecified: Secondary | ICD-10-CM | POA: Diagnosis not present

## 2024-03-26 DIAGNOSIS — D509 Iron deficiency anemia, unspecified: Secondary | ICD-10-CM | POA: Insufficient documentation

## 2024-03-30 DIAGNOSIS — R339 Retention of urine, unspecified: Secondary | ICD-10-CM | POA: Diagnosis not present

## 2024-03-30 DIAGNOSIS — M48062 Spinal stenosis, lumbar region with neurogenic claudication: Secondary | ICD-10-CM | POA: Diagnosis not present

## 2024-03-30 DIAGNOSIS — Z79891 Long term (current) use of opiate analgesic: Secondary | ICD-10-CM | POA: Diagnosis not present

## 2024-03-30 DIAGNOSIS — G4733 Obstructive sleep apnea (adult) (pediatric): Secondary | ICD-10-CM | POA: Diagnosis not present

## 2024-03-30 DIAGNOSIS — N183 Chronic kidney disease, stage 3 unspecified: Secondary | ICD-10-CM | POA: Diagnosis not present

## 2024-03-30 DIAGNOSIS — Z981 Arthrodesis status: Secondary | ICD-10-CM | POA: Diagnosis not present

## 2024-03-30 DIAGNOSIS — D631 Anemia in chronic kidney disease: Secondary | ICD-10-CM | POA: Diagnosis not present

## 2024-03-30 DIAGNOSIS — I872 Venous insufficiency (chronic) (peripheral): Secondary | ICD-10-CM | POA: Diagnosis not present

## 2024-03-30 DIAGNOSIS — I25119 Atherosclerotic heart disease of native coronary artery with unspecified angina pectoris: Secondary | ICD-10-CM | POA: Diagnosis not present

## 2024-03-30 DIAGNOSIS — S80822D Blister (nonthermal), left lower leg, subsequent encounter: Secondary | ICD-10-CM | POA: Diagnosis not present

## 2024-03-30 DIAGNOSIS — Z7984 Long term (current) use of oral hypoglycemic drugs: Secondary | ICD-10-CM | POA: Diagnosis not present

## 2024-03-30 DIAGNOSIS — L728 Other follicular cysts of the skin and subcutaneous tissue: Secondary | ICD-10-CM | POA: Diagnosis not present

## 2024-03-30 DIAGNOSIS — I130 Hypertensive heart and chronic kidney disease with heart failure and stage 1 through stage 4 chronic kidney disease, or unspecified chronic kidney disease: Secondary | ICD-10-CM | POA: Diagnosis not present

## 2024-03-30 DIAGNOSIS — Z9181 History of falling: Secondary | ICD-10-CM | POA: Diagnosis not present

## 2024-03-30 DIAGNOSIS — E7800 Pure hypercholesterolemia, unspecified: Secondary | ICD-10-CM | POA: Diagnosis not present

## 2024-03-30 DIAGNOSIS — Z96611 Presence of right artificial shoulder joint: Secondary | ICD-10-CM | POA: Diagnosis not present

## 2024-03-30 DIAGNOSIS — Z96612 Presence of left artificial shoulder joint: Secondary | ICD-10-CM | POA: Diagnosis not present

## 2024-03-30 DIAGNOSIS — K219 Gastro-esophageal reflux disease without esophagitis: Secondary | ICD-10-CM | POA: Diagnosis not present

## 2024-03-30 DIAGNOSIS — S80821S Blister (nonthermal), right lower leg, sequela: Secondary | ICD-10-CM | POA: Diagnosis not present

## 2024-03-30 DIAGNOSIS — Z87891 Personal history of nicotine dependence: Secondary | ICD-10-CM | POA: Diagnosis not present

## 2024-03-30 DIAGNOSIS — I5032 Chronic diastolic (congestive) heart failure: Secondary | ICD-10-CM | POA: Diagnosis not present

## 2024-03-30 DIAGNOSIS — E1122 Type 2 diabetes mellitus with diabetic chronic kidney disease: Secondary | ICD-10-CM | POA: Diagnosis not present

## 2024-03-30 DIAGNOSIS — Z96641 Presence of right artificial hip joint: Secondary | ICD-10-CM | POA: Diagnosis not present

## 2024-03-30 DIAGNOSIS — K760 Fatty (change of) liver, not elsewhere classified: Secondary | ICD-10-CM | POA: Diagnosis not present

## 2024-03-30 DIAGNOSIS — J449 Chronic obstructive pulmonary disease, unspecified: Secondary | ICD-10-CM | POA: Diagnosis not present

## 2024-04-01 ENCOUNTER — Telehealth: Payer: Self-pay

## 2024-04-01 DIAGNOSIS — R339 Retention of urine, unspecified: Secondary | ICD-10-CM | POA: Diagnosis not present

## 2024-04-01 DIAGNOSIS — E7800 Pure hypercholesterolemia, unspecified: Secondary | ICD-10-CM | POA: Diagnosis not present

## 2024-04-01 DIAGNOSIS — Z87891 Personal history of nicotine dependence: Secondary | ICD-10-CM | POA: Diagnosis not present

## 2024-04-01 DIAGNOSIS — Z96612 Presence of left artificial shoulder joint: Secondary | ICD-10-CM | POA: Diagnosis not present

## 2024-04-01 DIAGNOSIS — I25119 Atherosclerotic heart disease of native coronary artery with unspecified angina pectoris: Secondary | ICD-10-CM | POA: Diagnosis not present

## 2024-04-01 DIAGNOSIS — S80822D Blister (nonthermal), left lower leg, subsequent encounter: Secondary | ICD-10-CM | POA: Diagnosis not present

## 2024-04-01 DIAGNOSIS — Z96641 Presence of right artificial hip joint: Secondary | ICD-10-CM | POA: Diagnosis not present

## 2024-04-01 DIAGNOSIS — Z7984 Long term (current) use of oral hypoglycemic drugs: Secondary | ICD-10-CM | POA: Diagnosis not present

## 2024-04-01 DIAGNOSIS — G4733 Obstructive sleep apnea (adult) (pediatric): Secondary | ICD-10-CM | POA: Diagnosis not present

## 2024-04-01 DIAGNOSIS — I872 Venous insufficiency (chronic) (peripheral): Secondary | ICD-10-CM | POA: Diagnosis not present

## 2024-04-01 DIAGNOSIS — L728 Other follicular cysts of the skin and subcutaneous tissue: Secondary | ICD-10-CM | POA: Diagnosis not present

## 2024-04-01 DIAGNOSIS — K760 Fatty (change of) liver, not elsewhere classified: Secondary | ICD-10-CM | POA: Diagnosis not present

## 2024-04-01 DIAGNOSIS — Z96611 Presence of right artificial shoulder joint: Secondary | ICD-10-CM | POA: Diagnosis not present

## 2024-04-01 DIAGNOSIS — D631 Anemia in chronic kidney disease: Secondary | ICD-10-CM | POA: Diagnosis not present

## 2024-04-01 DIAGNOSIS — N183 Chronic kidney disease, stage 3 unspecified: Secondary | ICD-10-CM | POA: Diagnosis not present

## 2024-04-01 DIAGNOSIS — M48062 Spinal stenosis, lumbar region with neurogenic claudication: Secondary | ICD-10-CM | POA: Diagnosis not present

## 2024-04-01 DIAGNOSIS — Z79891 Long term (current) use of opiate analgesic: Secondary | ICD-10-CM | POA: Diagnosis not present

## 2024-04-01 DIAGNOSIS — J449 Chronic obstructive pulmonary disease, unspecified: Secondary | ICD-10-CM | POA: Diagnosis not present

## 2024-04-01 DIAGNOSIS — I130 Hypertensive heart and chronic kidney disease with heart failure and stage 1 through stage 4 chronic kidney disease, or unspecified chronic kidney disease: Secondary | ICD-10-CM | POA: Diagnosis not present

## 2024-04-01 DIAGNOSIS — I5032 Chronic diastolic (congestive) heart failure: Secondary | ICD-10-CM | POA: Diagnosis not present

## 2024-04-01 DIAGNOSIS — E1122 Type 2 diabetes mellitus with diabetic chronic kidney disease: Secondary | ICD-10-CM | POA: Diagnosis not present

## 2024-04-01 DIAGNOSIS — Z981 Arthrodesis status: Secondary | ICD-10-CM | POA: Diagnosis not present

## 2024-04-01 DIAGNOSIS — Z9181 History of falling: Secondary | ICD-10-CM | POA: Diagnosis not present

## 2024-04-01 DIAGNOSIS — S80821S Blister (nonthermal), right lower leg, sequela: Secondary | ICD-10-CM | POA: Diagnosis not present

## 2024-04-01 DIAGNOSIS — K219 Gastro-esophageal reflux disease without esophagitis: Secondary | ICD-10-CM | POA: Diagnosis not present

## 2024-04-01 DIAGNOSIS — S81802D Unspecified open wound, left lower leg, subsequent encounter: Secondary | ICD-10-CM | POA: Diagnosis not present

## 2024-04-01 NOTE — Telephone Encounter (Signed)
 Called [facility], spoke with Lucie. She stated she had no order form or no one under pt name or DOB. I verified with her fax number [phone], Lucie verbalized correct #. Advised I will re-fax the order form to them for pt diabetic shoe. Will inform pt and Olam as a CRM call was made.

## 2024-04-01 NOTE — Telephone Encounter (Unsigned)
 Copied from CRM #[id number]. Topic: Clinical - Medical Advice >> Apr 01, 2024  3:08 PM Montie POUR wrote: Reason for CRM:  Daughter is calling to see when the order for his diabetic shoes will be sent to [employer]. Please call Olam at [phone] to discuss. Thanks

## 2024-04-01 NOTE — Telephone Encounter (Signed)
 Spoke with Olam and made aware of phone call with Lucie with [facility], Olam verbalized understanding.

## 2024-04-02 DIAGNOSIS — E785 Hyperlipidemia, unspecified: Secondary | ICD-10-CM | POA: Diagnosis not present

## 2024-04-02 DIAGNOSIS — N184 Chronic kidney disease, stage 4 (severe): Secondary | ICD-10-CM | POA: Diagnosis not present

## 2024-04-02 DIAGNOSIS — E876 Hypokalemia: Secondary | ICD-10-CM | POA: Diagnosis not present

## 2024-04-02 DIAGNOSIS — I509 Heart failure, unspecified: Secondary | ICD-10-CM | POA: Diagnosis not present

## 2024-04-02 DIAGNOSIS — R809 Proteinuria, unspecified: Secondary | ICD-10-CM | POA: Diagnosis not present

## 2024-04-02 DIAGNOSIS — I129 Hypertensive chronic kidney disease with stage 1 through stage 4 chronic kidney disease, or unspecified chronic kidney disease: Secondary | ICD-10-CM | POA: Diagnosis not present

## 2024-04-02 DIAGNOSIS — R319 Hematuria, unspecified: Secondary | ICD-10-CM | POA: Diagnosis not present

## 2024-04-03 DIAGNOSIS — Z96612 Presence of left artificial shoulder joint: Secondary | ICD-10-CM | POA: Diagnosis not present

## 2024-04-03 DIAGNOSIS — L728 Other follicular cysts of the skin and subcutaneous tissue: Secondary | ICD-10-CM | POA: Diagnosis not present

## 2024-04-03 DIAGNOSIS — R339 Retention of urine, unspecified: Secondary | ICD-10-CM | POA: Diagnosis not present

## 2024-04-03 DIAGNOSIS — Z79891 Long term (current) use of opiate analgesic: Secondary | ICD-10-CM | POA: Diagnosis not present

## 2024-04-03 DIAGNOSIS — Z96641 Presence of right artificial hip joint: Secondary | ICD-10-CM | POA: Diagnosis not present

## 2024-04-03 DIAGNOSIS — Z7984 Long term (current) use of oral hypoglycemic drugs: Secondary | ICD-10-CM | POA: Diagnosis not present

## 2024-04-03 DIAGNOSIS — S80822D Blister (nonthermal), left lower leg, subsequent encounter: Secondary | ICD-10-CM | POA: Diagnosis not present

## 2024-04-03 DIAGNOSIS — K760 Fatty (change of) liver, not elsewhere classified: Secondary | ICD-10-CM | POA: Diagnosis not present

## 2024-04-03 DIAGNOSIS — N183 Chronic kidney disease, stage 3 unspecified: Secondary | ICD-10-CM | POA: Diagnosis not present

## 2024-04-03 DIAGNOSIS — I5032 Chronic diastolic (congestive) heart failure: Secondary | ICD-10-CM | POA: Diagnosis not present

## 2024-04-03 DIAGNOSIS — Z981 Arthrodesis status: Secondary | ICD-10-CM | POA: Diagnosis not present

## 2024-04-03 DIAGNOSIS — J449 Chronic obstructive pulmonary disease, unspecified: Secondary | ICD-10-CM | POA: Diagnosis not present

## 2024-04-03 DIAGNOSIS — D631 Anemia in chronic kidney disease: Secondary | ICD-10-CM | POA: Diagnosis not present

## 2024-04-03 DIAGNOSIS — E1122 Type 2 diabetes mellitus with diabetic chronic kidney disease: Secondary | ICD-10-CM | POA: Diagnosis not present

## 2024-04-03 DIAGNOSIS — K219 Gastro-esophageal reflux disease without esophagitis: Secondary | ICD-10-CM | POA: Diagnosis not present

## 2024-04-03 DIAGNOSIS — S80821S Blister (nonthermal), right lower leg, sequela: Secondary | ICD-10-CM | POA: Diagnosis not present

## 2024-04-03 DIAGNOSIS — I130 Hypertensive heart and chronic kidney disease with heart failure and stage 1 through stage 4 chronic kidney disease, or unspecified chronic kidney disease: Secondary | ICD-10-CM | POA: Diagnosis not present

## 2024-04-03 DIAGNOSIS — G4733 Obstructive sleep apnea (adult) (pediatric): Secondary | ICD-10-CM | POA: Diagnosis not present

## 2024-04-03 DIAGNOSIS — M48062 Spinal stenosis, lumbar region with neurogenic claudication: Secondary | ICD-10-CM | POA: Diagnosis not present

## 2024-04-03 DIAGNOSIS — Z96611 Presence of right artificial shoulder joint: Secondary | ICD-10-CM | POA: Diagnosis not present

## 2024-04-03 DIAGNOSIS — I25119 Atherosclerotic heart disease of native coronary artery with unspecified angina pectoris: Secondary | ICD-10-CM | POA: Diagnosis not present

## 2024-04-03 DIAGNOSIS — Z9181 History of falling: Secondary | ICD-10-CM | POA: Diagnosis not present

## 2024-04-03 DIAGNOSIS — E7800 Pure hypercholesterolemia, unspecified: Secondary | ICD-10-CM | POA: Diagnosis not present

## 2024-04-03 DIAGNOSIS — Z87891 Personal history of nicotine dependence: Secondary | ICD-10-CM | POA: Diagnosis not present

## 2024-04-03 DIAGNOSIS — I872 Venous insufficiency (chronic) (peripheral): Secondary | ICD-10-CM | POA: Diagnosis not present

## 2024-04-06 ENCOUNTER — Other Ambulatory Visit: Payer: Self-pay

## 2024-04-06 ENCOUNTER — Ambulatory Visit: Payer: Self-pay

## 2024-04-06 DIAGNOSIS — Z79891 Long term (current) use of opiate analgesic: Secondary | ICD-10-CM | POA: Diagnosis not present

## 2024-04-06 DIAGNOSIS — Z87891 Personal history of nicotine dependence: Secondary | ICD-10-CM | POA: Diagnosis not present

## 2024-04-06 DIAGNOSIS — I130 Hypertensive heart and chronic kidney disease with heart failure and stage 1 through stage 4 chronic kidney disease, or unspecified chronic kidney disease: Secondary | ICD-10-CM | POA: Diagnosis not present

## 2024-04-06 DIAGNOSIS — J449 Chronic obstructive pulmonary disease, unspecified: Secondary | ICD-10-CM | POA: Diagnosis not present

## 2024-04-06 DIAGNOSIS — Z96612 Presence of left artificial shoulder joint: Secondary | ICD-10-CM | POA: Diagnosis not present

## 2024-04-06 DIAGNOSIS — Z96611 Presence of right artificial shoulder joint: Secondary | ICD-10-CM | POA: Diagnosis not present

## 2024-04-06 DIAGNOSIS — E1122 Type 2 diabetes mellitus with diabetic chronic kidney disease: Secondary | ICD-10-CM | POA: Diagnosis not present

## 2024-04-06 DIAGNOSIS — I5032 Chronic diastolic (congestive) heart failure: Secondary | ICD-10-CM | POA: Diagnosis not present

## 2024-04-06 DIAGNOSIS — Z981 Arthrodesis status: Secondary | ICD-10-CM | POA: Diagnosis not present

## 2024-04-06 DIAGNOSIS — K219 Gastro-esophageal reflux disease without esophagitis: Secondary | ICD-10-CM | POA: Diagnosis not present

## 2024-04-06 DIAGNOSIS — G4733 Obstructive sleep apnea (adult) (pediatric): Secondary | ICD-10-CM | POA: Diagnosis not present

## 2024-04-06 DIAGNOSIS — R339 Retention of urine, unspecified: Secondary | ICD-10-CM | POA: Diagnosis not present

## 2024-04-06 DIAGNOSIS — M48062 Spinal stenosis, lumbar region with neurogenic claudication: Secondary | ICD-10-CM | POA: Diagnosis not present

## 2024-04-06 DIAGNOSIS — N183 Chronic kidney disease, stage 3 unspecified: Secondary | ICD-10-CM | POA: Diagnosis not present

## 2024-04-06 DIAGNOSIS — Z96641 Presence of right artificial hip joint: Secondary | ICD-10-CM | POA: Diagnosis not present

## 2024-04-06 DIAGNOSIS — E7800 Pure hypercholesterolemia, unspecified: Secondary | ICD-10-CM | POA: Diagnosis not present

## 2024-04-06 DIAGNOSIS — D631 Anemia in chronic kidney disease: Secondary | ICD-10-CM | POA: Diagnosis not present

## 2024-04-06 DIAGNOSIS — S80822D Blister (nonthermal), left lower leg, subsequent encounter: Secondary | ICD-10-CM | POA: Diagnosis not present

## 2024-04-06 DIAGNOSIS — L728 Other follicular cysts of the skin and subcutaneous tissue: Secondary | ICD-10-CM | POA: Diagnosis not present

## 2024-04-06 DIAGNOSIS — K760 Fatty (change of) liver, not elsewhere classified: Secondary | ICD-10-CM | POA: Diagnosis not present

## 2024-04-06 DIAGNOSIS — I25119 Atherosclerotic heart disease of native coronary artery with unspecified angina pectoris: Secondary | ICD-10-CM | POA: Diagnosis not present

## 2024-04-06 DIAGNOSIS — I872 Venous insufficiency (chronic) (peripheral): Secondary | ICD-10-CM | POA: Diagnosis not present

## 2024-04-06 DIAGNOSIS — Z9181 History of falling: Secondary | ICD-10-CM | POA: Diagnosis not present

## 2024-04-06 DIAGNOSIS — S80821S Blister (nonthermal), right lower leg, sequela: Secondary | ICD-10-CM | POA: Diagnosis not present

## 2024-04-06 DIAGNOSIS — Z7984 Long term (current) use of oral hypoglycemic drugs: Secondary | ICD-10-CM | POA: Diagnosis not present

## 2024-04-06 NOTE — Patient Outreach (Signed)
 Complex Care Management   Visit Note  04/06/2024  Name:  Mark Terry. MRN: [id number] DOB: Oct 03, 1934  Situation: Referral received for Complex Care Management related to Heart Failure, Diabetes with Complications, and Chronic Kidney Disease I obtained verbal consent from Patient.  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   Diagnosis: Squamous cell Carcinoma Location: Mid superior forehead Pre- operative size: 1cm x 1cm Total stages: 1 Post-Operative Size: 2.3cm x 2.1cm Date of Surgery: 05/01/2019    Assessment: Patient Reported Symptoms:  Cognitive Cognitive Status: Alert and oriented to person, place, and time, Insightful and able to interpret abstract concepts, Normal speech and language skills Cognitive/Intellectual Conditions Management [RPT]: None reported or documented in medical history or problem list   Health Maintenance Behaviors: Annual physical exam Healing Pattern: Slow Health Facilitated by: Prayer/meditation  Neurological Neurological Review of Symptoms: Numbness Neurological Management Strategies: Routine screening, Medication therapy Neurological Comment: neuropathy  HEENT HEENT Symptoms Reported: Not assessed      Cardiovascular   Weight: 205 lb (93 kg) Cardiovascular Comment: BP today 118/67 states gets sluggish feeling when low, weights daily and  takes lasix  prn 3 lb weight gain overnight - took lasic yesterday for weight 209 lb, usually needs lasix  every other day, aware of sx to call provider and red flags for ED, sending CHF acton plan  Respiratory Respiratory Symptoms Reported: Not assesed Other Respiratory Symptoms: will complete assessment 04/09/24    Endocrine Endocrine Symptoms Reported: No symptoms reported Is patient diabetic?: Yes Is patient checking blood sugars at home?: Yes List most recent blood sugar readings, include date and time of day: FBG today 199 reports elevated last few days - also reporting UTI sx and discussed connection, reports FBG usu 100-160, no lows tries to eat healthy - gets meals from living facility, not always preferred but states better than the frozen meals he was eating prior to move, discussed healthy diet    Gastrointestinal Gastrointestinal Symptoms Reported: Not assessed      Genitourinary Genitourinary Symptoms Reported: Frequency, Pain/burning with urination, Urgency Additional Genitourinary Details: Patient called Nephrology provider this morning re UTI sx over weekend: pain, poor appetite, feeling weak, hot, frequ/urgency, OTC AZO helped a little, feels like past UTIs, agreed for conference call with PCP office and then irritated, explained PCP is apropriate provider for UTI and need to check urine before prescribing antibiotic (was requesting), spoke with scheduling but patient stated could not get ride today, spoke with triage nurse and she is sending info along with request for PCP to request urine collection through existing HHC w/[facility], they will call patient back, patient aware of sx for UC/ED Genitourinary Comment: follow up appointment with patient on  04/09/24 to complete assessment, patient reports concern about recent AKI/CKD  Integumentary Integumentary Symptoms Reported: No symptoms reported Additional Integumentary Details: patient with HHC thru [facility] managment  weekly, sates had developed swelling with blisters, blisters healing and swelling improved, no open areas on legs, pressure ulcer on backside healing Skin Management Strategies: Dressing changes, Routine screening  Musculoskeletal Musculoskelatal Symptoms Reviewed: Back pain, Difficulty walking, Limited mobility, Muscle pain, Unsteady gait, Weakness Additional Musculoskeletal Details: reports wheelchair 90% of the time%, receiving PT, generalized weakness        Psychosocial Psychosocial Symptoms Reported: Not assessed     Quality of Family Relationships: helpful, supportive, involved Do you feel physically threatened by others?: No    04/06/2024    PHQ2-9 Depression Screening   Little interest or pleasure in doing things    Feeling down, depressed, or hopeless    PHQ-2 - Total Score    Trouble falling or staying asleep, or sleeping too much    Feeling tired or having little energy    Poor appetite or overeating     Feeling bad about yourself - or that you are a failure or have let yourself or your family down    Trouble concentrating on things, such as reading the newspaper or watching television    Moving or speaking so slowly that other people could have noticed.  Or the opposite - being so fidgety or restless that you have been moving around a lot more than usual    Thoughts that you would be better off dead, or hurting yourself in some way    PHQ2-9 Total Score    If you checked off any problems, how difficult have these problems made it for you to do your work, take care of things at home, or get along with other people    Depression Interventions/Treatment      Vitals:   04/06/24 1330  BP: 118/67    Medications Reviewed Today     Reviewed by Devra Lands, RN (Registered Nurse) on 04/06/24 at 1053  Med List Status: <None>   Medication Order Taking? Sig Documenting Provider Last Dose Status Informant  ACCU-CHEK GUIDE TEST test strip [id number] Yes CHECK BLOOD SUGAR  ONCE DAILY FOR TYPE 2 DIABETES Gasper Nancyann BRAVO, MD  Active   alfuzosin  (UROXATRAL ) 10 MG 24 hr tablet [id number] Yes Take 1 tablet (10 mg total) by mouth daily with breakfast. Take in place of doxazosin  Gasper Nancyann BRAVO, MD  Active   ascorbic acid  (VITAMIN C ) 500 MG tablet [id number] Yes Take 1 tablet (500 mg total) by mouth 2 (two) times daily. Patel, Sona, MD  Active   aspirin  81 MG tablet [id number] Yes Take 81 mg by mouth daily. [provider]  Active Self           Med Note HALLIE, FELECIA N   Tue Oct 11, 2020 11:09 AM) Reports taking every other day.  DULoxetine  (CYMBALTA ) 30 MG capsule [id number] Yes TAKE 1 CAPSULE BY MOUTH EVERY DAY Gasper Nancyann BRAVO, MD  Active   empagliflozin  (JARDIANCE ) 25 MG TABS tablet [id number]  TAKE 1 TABLET BY MOUTH DAILY Gasper Nancyann BRAVO, MD  Active            Med Note LESLY, RICHERD CINDERELLA Schaumann Feb 06, 2024 10:38 AM) Restarted per Nephrology  empagliflozin  (JARDIANCE ) 25 MG TABS tablet [id number] Yes Take 25 mg by mouth daily. [provider]  Active   feeding supplement (ENSURE PLUS HIGH PROTEIN)  LIQD [id number]  Take 237 mLs by mouth 2 (two) times daily between meals.  Patient not taking: Reported on 04/06/2024   Patel, Sona, MD  Active   fluticasone  (FLONASE ) 50 MCG/ACT nasal spray [id number]  USE 1 TO 2 SPRAYS IN BOTH  NOSTRILS DAILY Gasper Nancyann BRAVO, MD  Active   furosemide  (LASIX ) 40 MG tablet [id number] Yes TAKE 1 TABLET BY MOUTH DAILY  Patient taking differently: Take 40 mg by mouth daily. Takes prn only for > 3 lb weight gain   Gasper Nancyann BRAVO, MD  Active   gabapentin  (NEURONTIN ) 300 MG capsule [id number] Yes TAKE 1 CAPSULE BY MOUTH 3 TIMES  DAILY  Patient taking differently: Take 300 mg by mouth 3 (three) times daily. Reports sometimes only takes BID - helps him sleep   Gasper Nancyann BRAVO, MD  Active   glimepiride  (AMARYL ) 1 MG tablet [id number] Yes TAKE 1 TABLET BY MOUTH DAILY Gasper Nancyann BRAVO, MD  Active   HYDROcodone -acetaminophen  (NORCO)  7.5-325 MG tablet [id number]  Take 1 tablet by mouth 2 (two) times daily. [provider]  Active   HYDROcodone -acetaminophen  (NORCO/VICODIN) 5-325 MG tablet [id number] Yes TAKE 1 TABLET BY MOUTH TWICE DAILY AS NEEDED  Patient taking differently: Patient minimizes doses 1/2 am, 1/2 during afternoon as needed and 1 at night   [provider]  Active   levothyroxine  (SYNTHROID ) 50 MCG tablet [id number] Yes TAKE 1 TABLET BY MOUTH DAILY Gasper Nancyann BRAVO, MD  Active   loratadine  (CLARITIN ) 10 MG tablet [id number] Yes Take 10 mg by mouth daily. [provider]  Active Self  losartan  (COZAAR ) 25 MG tablet [id number] Yes Take 25 mg by mouth daily. [provider]  Active   metoprolol  tartrate (LOPRESSOR ) 25 MG tablet [id number] Yes TAKE 1 TABLET BY MOUTH TWICE  DAILY Gasper Nancyann BRAVO, MD  Active   Multiple Vitamin (MULTIVITAMIN WITH MINERALS) TABS [id number] Yes Take 1 tablet by mouth daily. [provider]  Active Self  omeprazole  (PRILOSEC) 40 MG capsule [id number] Yes TAKE 1 CAPSULE BY MOUTH DAILY Gasper Nancyann BRAVO, MD  Active   oxybutynin  (DITROPAN ) 5 MG tablet [id number] Yes TAKE 1 TABLET BY MOUTH TWICE  DAILY Gasper Nancyann BRAVO, MD  Active   simvastatin  (ZOCOR ) 40 MG tablet [id number] Yes TAKE 1 TABLET BY MOUTH AT  BEDTIME Gasper Nancyann BRAVO, MD  Active   spironolactone (ALDACTONE) 25 MG tablet [id number] Yes Take 25 mg by mouth. [provider]  Active   traZODone  (DESYREL ) 100 MG tablet [id number] Yes TAKE 1/2 TO 1 TABLET BY MOUTH AT BEDTIME AS NEEDED. FOR SLEEP Gasper Nancyann BRAVO, MD  Active   Vitamin D , Ergocalciferol , (DRISDOL ) 1.25 MG (50000 UNIT) CAPS capsule [id number] Yes Take 50,000 Units by mouth every 7 (seven) days. [provider]  Active   Med List Note Wendelyn Pulling, RN 08/26/18 [id number]): Medication agreement and Opiod contract signed 08/26/18 MR 10/31/18 UDS 08/26/18 Serum drug screen 05/30/19            Recommendation:   PCP  Follow-up Continue Current Plan of Care  Follow Up Plan:   Telephone follow-up This week to complete assessment and follow up on UTI 04/09/24 at 9:30 am  Nestora Duos, MSN, RN [place]  [facility], [facility] RN Care Manager Direct Dial: [phone] Fax: [phone]

## 2024-04-06 NOTE — Telephone Encounter (Signed)
 FYI Only or Action Required?: Action required by provider: clinical question for provider.  Patient was last seen in primary care on 03/23/2024 by Mark Nancyann BRAVO, MD.  Called Nurse Triage reporting Urinary Frequency.  Symptoms began several days ago.  Interventions attempted: OTC medications: Azo.  Symptoms are: unchanged.  Triage Disposition: See Physician Within 24 Hours  Patient/caregiver understands and will follow disposition?: No, refuses disposition   Copied from CRM #[id number]. Topic: Clinical - Red Word Triage >> Apr 06, 2024 10:23 AM [name] wrote: [facility] that prompted transfer to Nurse Triage: The patient and their nurse care manager Rosaline would like to speak with a member of clinical staff about frequent urination that the patient has been experiencing since Saturday 04/04/24 Reason for Disposition  Urinating more frequently than usual (i.e., frequency) OR new-onset of the feeling of an urgent need to urinate (i.e., urgency)  Answer Assessment - Initial Assessment Questions [name], Care Manger called with the patient on the line. He says he has urine frequency and pain with urination. He says he took AZO over the weekend and symptoms did not improve. Advised OV. He says he is not able to come to the office. Hadassah says she's wanting to know if Dr. Gasper would write an order for home health to obtain the urine sample due to transportation issues. There is a nurse coming to the home today at 1p who will change his uniboot. Advised I will send this to the office after attempting to call [facility] and no answer. She verbalized understanding.   1. SYMPTOM: What's the main symptom you're concerned about? (e.g., frequency, incontinence)     Frequency  2. ONSET: When did the symptoms start?     Friday night  3. PAIN: Is there any pain? If Yes, ask: How bad is it? (Scale: 1-10; mild, moderate, severe)     6  4. CAUSE: What do you think is causing the symptoms?      Bladder infection  5. OTHER SYMPTOMS: Do you have any other symptoms? (e.g., blood in urine, fever, flank pain, pain with urination)     Pain with urination  Protocols used: Urinary Symptoms-A-AH

## 2024-04-06 NOTE — Telephone Encounter (Signed)
 Ok for home health nurse to collect urine sample for u/a

## 2024-04-06 NOTE — Patient Instructions (Signed)
 Visit Information  Thank you for taking time to visit with me today. Please don't hesitate to contact me if I can be of assistance to you before our next scheduled appointment.  Our next appointment is by telephone on 04/09/2024 at 9:30 am Please call the care guide team at [phone] if you need to cancel or reschedule your appointment.   Following is a copy of your care plan:   Goals Addressed             This Visit's Progress    VBCI RN Care Plan - DM/CKD/CHF       Problems:  Chronic Disease Management support and education needs related to CHF, CKD Stage 4, and DMII  Goal: Over the next 90  days the Patient will attend all scheduled medical appointments: Patient will attend all appointments as evidenced by chart review        continue to work with RN Care Manager and/or Social Worker to address care management and care coordination needs related to CHF, CKD Stage 4, and DMII as evidenced by adherence to care management team scheduled appointments     take all medications exactly as prescribed and will call provider for medication related questions as evidenced by patient reporting compliance    verbalize basic understanding of CHF, CKD Stage 4, and DMII disease process and self health management plan as evidenced by taking medications as prescribed, following CHF Action Plan including when to take lasix , call provider and red flags for ED, monitoring and promptly reporting UTI sx to provider, adequate hydration while following restriction due to HF, limit sodium intake, continue PT/exercises on own while sitting, continue monitoring BP, Weight and FBG daily nd repoting concerns promptly to provder  Interventions:   Heart Failure Interventions: Basic overview and discussion of pathophysiology of Heart Failure reviewed Provided education on low sodium diet Reviewed Heart Failure Action Plan in depth and provided written copy Discussed importance of daily weight and advised patient  to weigh and record daily Reviewed role of diuretics in prevention of fluid overload and management of heart failure; Discussed the importance of keeping all appointments with provider Screening for signs and symptoms of depression related to chronic disease state  Assessed social determinant of health barriers    Chronic Kidney Disease Interventions: Assessed the Patient understanding of chronic kidney disease    Evaluation of current treatment plan related to chronic kidney disease self management and patient's adherence to plan as established by provider      Provided education to patient re: stroke prevention, s/s of heart attack and stroke    Reviewed prescribed diet Heart Healthy Low Carb Renal - no NSAIDS Reviewed medications with patient and discussed importance of compliance    Advised patient, providing education and rationale, to monitor blood pressure daily and record, calling PCP for findings outside established parameters    Discussed complications of poorly controlled blood pressure such as heart disease, stroke, circulatory complications, vision complications, kidney impairment, sexual dysfunction    Discussed plans with patient for ongoing care management follow up and provided patient with direct contact information for care management team    Provided education on kidney disease progression    Discussed UTI sx to report promptly to provider, explained need for Urine sample to evaluate infection type before prescribing antibiotics, contacted scheduling/Nurse Triage - [facility] to collect urine per PCP, red flags for ED provided to patient Last practice recorded BP readings:  BP Readings from Last 3 Encounters:  04/06/24 118/67  03/23/24 126/64  02/20/24 (!) 166/74   Most recent eGFR/CrCl:  Lab Results  Component Value Date   EGFR 27 (L) 09/25/2023    No components found for: CRCL    Diabetes Interventions: Assessed patient's understanding of A1c goal: <7% Provided  education to patient about basic DM disease process Reviewed medications with patient and discussed importance of medication adherence Counseled on importance of regular laboratory monitoring as prescribed Discussed plans with patient for ongoing care management follow up and provided patient with direct contact information for care management team Provided patient with written educational materials related to hypo and hyperglycemia and importance of correct treatment Advised patient, providing education and rationale, to check cbg fasting in the morning and if symptoms of low/high BG and record, calling PCP for findings outside established parameters Review of patient status, including review of consultants reports, relevant laboratory and other test results, and medications completed Lab Results  Component Value Date   HGBA1C 7.5 (A) 01/15/2024    Patient Self-Care Activities:  Attend all scheduled provider appointments Call pharmacy for medication refills 3-7 days in advance of running out of medications Call provider office for new concerns or questions  Take medications as prescribed   Work with the social worker to address care coordination needs and will continue to work with the clinical team to address health care and disease management related needs do ankle pumps when sitting keep legs up while sitting track weight in diary use salt in moderation watch for swelling in feet, ankles and legs every day weigh myself daily follow rescue plan if symptoms flare-up eat more whole grains, fruits and vegetables, lean meats and healthy fats know when to call the doctor:follow Heart Failure Action Plan, report UTI symptoms promptly to provider or go to UC, call for repeat BG readings too low/high check blood sugar at prescribed times: once daily and when you have symptoms of low or high blood sugar check feet daily for cuts, sores or redness enter blood sugar readings and medication or  insulin  into daily log take the blood sugar log to all doctor visits fill half of plate with vegetables limit fast food meals to no more than 1 per week do heel pump exercise 2 to 3 times each day keep feet up while sitting wash and dry feet carefully every day wear comfortable, cotton socks wear comfortable, well-fitting shoes  Plan:  Telephone follow up appointment with care management team member scheduled for:  04/09/24 at 9:30 am to complete assessment and follow up on UTI             Please call the Suicide and Crisis Lifeline: 988 call the [place]  National Suicide Prevention Lifeline: 339-830-8533 or TTY: 281-526-3646 TTY 807-230-1599) to talk to a trained counselor call 1-800-273-TALK (toll free, 24 hour hotline) go to [facility] [address] [place] [phone]) call 911 if you are experiencing a Mental Health or Behavioral Health Crisis or need someone to talk to.  Patient verbalizes understanding of instructions and care plan provided today and agrees to view in MyChart. Active MyChart status and patient understanding of how to access instructions and care plan via MyChart confirmed with patient.     Nestora Duos, MSN, RN [place]  [facility], [facility] RN Care Manager Direct Dial: [phone] Fax: [phone]  Heart Failure Action Plan A heart failure action plan helps you know what to do when you have symptoms of heart failure. Your action plan is  a color-coded plan that lists the symptoms to watch for and indicates what actions to take. If you have symptoms in the green zone, you're doing well. If you have symptoms in the yellow zone, you're having problems. If you have symptoms in the red zone, you need medical care right away. Follow the plan that was created by you and your health care provider. Review your plan each time you visit your provider. Green zone These signs mean you're  doing well and can continue what you're doing: You don't have new or worsening shortness of breath. You have very little swelling or no new swelling. Your weight is stable (no gain or loss). You have a normal activity level. You don't have chest pain or any other new symptoms. Yellow zone These signs and symptoms mean your condition may be getting worse and you should make some changes: You have trouble breathing when you're active. You have swelling in your feet or legs or have discomfort in your belly. You gain 2-3 lb (0.9-1.4 kg) in 24 hours, or 5 lb (2.3 kg) in a week. This amount may be more or less depending on your condition. You get tired easily. You have trouble sleeping. You have a dry cough. If you have any of these symptoms: Contact your provider within the next day. Your provider may adjust your medicines. Red zone These signs and symptoms mean you should get medical help right away: You have trouble breathing when resting or cannot lie flat and you need to raise your head to help you breathe. You have a dry cough that's getting worse. You have swelling or pain in your feet or legs or discomfort in your belly that's getting worse. You suddenly gain more than 2-3 lb (0.9-1.4 kg) in 24 hours, or more than 5 lb (2.3 kg) in a week. This amount may be more or less depending on your condition. You have trouble staying awake or you feel confused. You don't have an appetite. You have worsening sadness or depression. These symptoms may be an emergency. Call 911 right away. Do not wait to see if the symptoms will go away. Do not drive yourself to the hospital. Follow these instructions at home: Take medicines only as told. Eat a heart-healthy diet. Work with a dietitian to create an eating plan that's best for you. Weigh yourself each day. Your target weight is __________ lb (__________ kg). Call your provider if you gain more than __________ lb (__________ kg) in 24 hours, or  more than __________ lb (__________ kg) in a week. Health care provider name: _____________________________________________________ Health care provider phone number: _____________________________________________________ Where to find more information American Heart Association: heart.org This information is not intended to replace advice given to you by your health care provider. Make sure you discuss any questions you have with your health care provider. Document Revised: 02/07/2023 Document Reviewed: 02/07/2023 Elsevier Patient Education  2024 Elsevier Inc.  Type 2 Diabetes Mellitus, Self-Care, Adult When you have type 2 diabetes (type 2 diabetes mellitus), you must make sure your blood sugar (glucose) stays in a healthy range. You can do this with: Nutrition. Exercise. Lifestyle changes. Medicines or insulin , if needed. Support from your doctors and others. What are the risks? Having type 2 diabetes can raise your risk for other long-term (chronic) health problems. You may get medicines to help prevent these problems. How to stay aware of your blood sugar  Check your blood sugar level every day, as often as told. Have your  A1C (hemoglobin A1C) level checked two or more times a year. Have it checked more often if told. Your doctor will set personal treatment goals for you. In general, you should have these blood sugar levels: Before meals: 80-130 mg/dL (4.4-7.2 mmol/L). After meals: below 180 mg/dL (10 mmol/L). A1C: less than 7%. How to manage high and low blood sugar Symptoms of high blood sugar High blood sugar is also called hyperglycemia. Know the symptoms of high blood sugar. These may include: More thirst. Hunger. Feeling very tired. Needing to pee (urinate) more often than normal. Seeing things blurry. Symptoms of low blood sugar Low blood sugar is also called hypoglycemia. This is when blood sugar is at or below 70 mg/dL (3.9 mmol/L). Symptoms may  include: Hunger. Feeling worried or nervous (anxious). Feeling sweaty and cold to the touch (clammy). Being dizzy or light-headed. Feeling sleepy. A fast heartbeat. Feeling grouchy (irritable). Tingling or loss of feeling (numbness) around your mouth, lips, or tongue. Restless sleep. Diabetes medicines can cause low blood sugar. You are more at risk: While you exercise. After exercise. During sleep. When you are sick. When you skip meals or do not eat for a long time. Treating low blood sugar If you think you have low blood sugar, eat or drink something sugary right away. Keep 15 grams of a fast-acting carb (carbohydrate) with you all the time. Make sure your family and friends know how to treat you if you cannot treat yourself. Treating very low blood sugar Severe hypoglycemia is when your blood sugar is at or below 54 mg/dL (3 mmol/L). Severe hypoglycemia is an emergency. Get medical help right away. Call your local emergency services (911 in the [place] Do not wait to see if the symptoms will go away. Do not drive yourself to the hospital. You may need a glucagon shot if you have very low blood sugar and you cannot eat or drink. Have a family member or friend learn how to check your blood sugar and how to give you a glucagon shot. Ask your doctor if you should have a kit for glucagon shots. Follow these instructions at home: Medicines Take prescribed insulin  or diabetes medicines as told by your health care provider. Do not run out of insulin  or other medicines. Plan ahead. If you use insulin , change the amount you take based on how active you are and what foods you eat. Your doctor will tell you how to do this. Take over-the-counter and prescription medicines only as told by your doctor. Eating and drinking  Eat healthy foods. These include: Low-fat (lean) proteins. Complex carbs, such as whole grains. Fresh fruits and vegetables. Low-fat dairy products. Healthy fats. Meet  with a food expert (dietitian) to make an eating plan. Follow instructions from your doctor about what you cannot eat or drink. Drink enough fluid to keep your pee (urine) pale yellow. Keep track of carbs that you eat. Read food labels and learn serving sizes of foods. Follow your sick-day plan when you cannot eat or drink as normal. Make this plan with your doctor so it is ready to use. Activity Exercise as told by your doctor. You may need to: Do stretching and strength exercises two or more times a week. Do 150 minutes or more of exercise each week that makes your heart beat faster and makes you sweat. Spread out your exercise over 3 or more days a week. Do not go more than 2 days in a row without exercise. Talk with your doctor  before you start a new exercise. Your doctor may tell you to change: How much insulin  or medicines you take. How much food you eat. Lifestyle Do not smoke or use any products that contain nicotine or tobacco. If you need help quitting, ask your doctor. If you drink alcohol and your doctor says that it is safe for you: Limit how much you have to: 0-1 drink a day for women who are not pregnant. 0-2 drinks a day for men. Know how much alcohol is in your drink. In the [place] one drink equals one 12 oz bottle of beer (355 mL), one 5 oz glass of wine (148 mL), or one 1 oz glass of hard liquor (44 mL). Learn to deal with stress. If you need help, ask your doctor. Body care  Stay up to date with your shots (immunizations). Have your eyes and feet checked by a doctor as often as told. Check your skin and feet every day. Check for cuts, bruises, redness, blisters, or sores. Brush your teeth and gums two times a day. Floss one or more times a day. Go to the dentist one or more times every 6 months. Stay at a healthy weight. General instructions Share your diabetes care plan with: Your work or school. People you live with. Carry a card or wear jewelry that says you  have diabetes. Keep all follow-up visits. Questions to ask your doctor Do I need to meet with a certified expert in diabetes education and care? Where can I find a support group? Where to find more information For help and guidance and more information about diabetes, please go to: American Diabetes Association: www.diabetes.org American Association of Diabetes Care and Education Specialists: www.diabeteseducator.org International Diabetes Federation: DCOnly.dk Summary When you have type 2 diabetes, you must make sure your blood sugar (glucose) stays in a healthy range. You can do this with nutrition, exercise, medicines and insulin , and support from doctors and others. Check your blood sugar every day, or as often as told. Having diabetes can raise your risk for other long-term health problems. You may get medicines to help prevent these problems. Share your diabetes management plan with people at work, school, and home. Keep all follow-up visits. This information is not intended to replace advice given to you by your health care provider. Make sure you discuss any questions you have with your health care provider. Document Revised: 09/19/2020 Document Reviewed: 09/19/2020 Elsevier Patient Education  2024 Elsevier Inc.  Chronic Kidney Disease in Adults: What to Know Chronic kidney disease (CKD) is when lasting damage happens to the kidneys slowly over time. The kidneys are two organs that do many important things in the body. These include: Taking waste and extra fluid out of the blood to make pee (urine). Making hormones. Keeping the right amount of fluids and chemicals in the body. A small amount of kidney damage may not cause problems. You must take steps to help keep the kidney damage from getting worse. A lot of damage may cause kidney failure. Kidney failure means the kidneys can no longer work right. What are the causes? Diabetes. High blood pressure. Diseases that affect the  heart and blood vessels. Other kidney diseases. Diseases that affect the body's defense system (immune system). A problem with the flow of pee. This may be caused by: Kidney stones. Cancer. An enlarged prostate, in males. A kidney infection or urinary tract infection (UTI) that keeps coming back. What increases the risk? Getting older. The chances of having CKD  increase with age. A family history of kidney disease or kidney failure. Having a disease caused by genes. Taking medicines that can harm the kidneys. Being near or having contact with harmful substances. Being very overweight. Using tobacco now or in the past. What are the signs or symptoms? Common symptoms of CKD include: Feeling very tired and having less energy. Swelling of the face, legs, ankles, or feet. Throwing up or feeling like you may throw up. Not wanting to eat as much as normal. Being confused or not able to focus. Twitches and cramps in the leg muscles or other muscles. Dry, itchy skin. Other symptoms may include: Shortness of breath. Trouble sleeping. Making less pee, or making more pee, especially at night. A taste of metal in your mouth. You may also become anemic. Anemia means there's not enough red blood cells in your blood. You may get symptoms slowly. You may not notice them until the kidney damage gets very bad. How is this diagnosed? CKD may be diagnosed based on: Tests on your blood or pee. Imaging tests, like an ultrasound or a CT scan. A kidney biopsy. For this test, a sample of kidney tissue is removed to be looked at under a microscope. These tests will help to find out how serious the CKD is. How is this treated? Often, there's no cure for CKD. Treatment can help with symptoms and help keep the disease from getting worse. Treatment may include: Treating other problems that are causing your CKD or making it worse. Diet changes. You may need to: Avoid alcohol. Avoid foods that are high in  salt, potassium, phosphorous, and protein. Taking medicines for symptoms and to help control other conditions. Dialysis. This treatment gets harmful waste out of your body. It may be needed if you have kidney failure. Follow these instructions at home: Medicines Take your medicines only as told. The amount of some medicines you take may need to be changed. Do not take any new medicines, vitamins, or supplements unless your health care provider says it's okay. These may make kidney damage worse. Lifestyle Do not smoke, vape, or use nicotine or tobacco. If you drink alcohol: Limit how much you have to: 0-1 drink a day if you're male. 0-2 drinks a day if you're male. Know how much alcohol is in your drink. In the [place] one drink is one 12 oz bottle of beer (355 mL), one 5 oz glass of wine (148 mL), or one 1 oz glass of hard liquor (44 mL). Stay at a healthy weight. If you need help, ask your provider. General instructions  Eat and drink as told. Track your blood pressure at home. Tell your provider about any changes. If you have diabetes, track your blood sugar as told. Exercise at least 30 minutes a day, 5 days a week. Keep your shots (vaccinations) up to date. Keep all follow-up visits. Your provider may need to change your treatments over time. Where to find support American Kidney Fund: [place].com.au Kidney School: kidneyschool.org American Association of Kidney Patients: https://www.miller-montoya.com/ Where to find more information National Kidney Foundation: kidney.org Centers for Disease Control and Prevention. To learn more: Go to DiningCalendar.de. Click Search. Type chronic kidney disease in the search box. Contact a health care provider if: You have new symptoms. You get symptoms of end-stage kidney disease. These include: Headaches. Numbness in your hands or feet. Leg cramps. Easy bruising. Get help right away if: You have a fever. You make less pee than usual. You have pain  or bleeding when  you pee or poop. You have chest pain. You have shortness of breath. These symptoms may be an emergency. Call 911 right away. Do not wait to see if the symptoms will go away. Do not drive yourself to the hospital. This information is not intended to replace advice given to you by your health care provider. Make sure you discuss any questions you have with your health care provider. Document Revised: 05/07/2023 Document Reviewed: 12/28/2022 Elsevier Patient Education  2024 [employer].

## 2024-04-07 ENCOUNTER — Telehealth: Payer: Self-pay

## 2024-04-07 ENCOUNTER — Other Ambulatory Visit: Payer: Self-pay | Admitting: Family Medicine

## 2024-04-07 DIAGNOSIS — N183 Chronic kidney disease, stage 3 unspecified: Secondary | ICD-10-CM | POA: Diagnosis not present

## 2024-04-07 DIAGNOSIS — I25119 Atherosclerotic heart disease of native coronary artery with unspecified angina pectoris: Secondary | ICD-10-CM | POA: Diagnosis not present

## 2024-04-07 DIAGNOSIS — Z981 Arthrodesis status: Secondary | ICD-10-CM | POA: Diagnosis not present

## 2024-04-07 DIAGNOSIS — E1122 Type 2 diabetes mellitus with diabetic chronic kidney disease: Secondary | ICD-10-CM | POA: Diagnosis not present

## 2024-04-07 DIAGNOSIS — K219 Gastro-esophageal reflux disease without esophagitis: Secondary | ICD-10-CM | POA: Diagnosis not present

## 2024-04-07 DIAGNOSIS — Z7984 Long term (current) use of oral hypoglycemic drugs: Secondary | ICD-10-CM | POA: Diagnosis not present

## 2024-04-07 DIAGNOSIS — D631 Anemia in chronic kidney disease: Secondary | ICD-10-CM | POA: Diagnosis not present

## 2024-04-07 DIAGNOSIS — Z87891 Personal history of nicotine dependence: Secondary | ICD-10-CM | POA: Diagnosis not present

## 2024-04-07 DIAGNOSIS — Z9181 History of falling: Secondary | ICD-10-CM | POA: Diagnosis not present

## 2024-04-07 DIAGNOSIS — S80821S Blister (nonthermal), right lower leg, sequela: Secondary | ICD-10-CM | POA: Diagnosis not present

## 2024-04-07 DIAGNOSIS — Z96612 Presence of left artificial shoulder joint: Secondary | ICD-10-CM | POA: Diagnosis not present

## 2024-04-07 DIAGNOSIS — E7800 Pure hypercholesterolemia, unspecified: Secondary | ICD-10-CM | POA: Diagnosis not present

## 2024-04-07 DIAGNOSIS — R339 Retention of urine, unspecified: Secondary | ICD-10-CM | POA: Diagnosis not present

## 2024-04-07 DIAGNOSIS — S80822D Blister (nonthermal), left lower leg, subsequent encounter: Secondary | ICD-10-CM | POA: Diagnosis not present

## 2024-04-07 DIAGNOSIS — K760 Fatty (change of) liver, not elsewhere classified: Secondary | ICD-10-CM | POA: Diagnosis not present

## 2024-04-07 DIAGNOSIS — Z79891 Long term (current) use of opiate analgesic: Secondary | ICD-10-CM | POA: Diagnosis not present

## 2024-04-07 DIAGNOSIS — R300 Dysuria: Secondary | ICD-10-CM

## 2024-04-07 DIAGNOSIS — G4733 Obstructive sleep apnea (adult) (pediatric): Secondary | ICD-10-CM | POA: Diagnosis not present

## 2024-04-07 DIAGNOSIS — J449 Chronic obstructive pulmonary disease, unspecified: Secondary | ICD-10-CM | POA: Diagnosis not present

## 2024-04-07 DIAGNOSIS — Z96611 Presence of right artificial shoulder joint: Secondary | ICD-10-CM | POA: Diagnosis not present

## 2024-04-07 DIAGNOSIS — I130 Hypertensive heart and chronic kidney disease with heart failure and stage 1 through stage 4 chronic kidney disease, or unspecified chronic kidney disease: Secondary | ICD-10-CM | POA: Diagnosis not present

## 2024-04-07 DIAGNOSIS — I5032 Chronic diastolic (congestive) heart failure: Secondary | ICD-10-CM | POA: Diagnosis not present

## 2024-04-07 DIAGNOSIS — Z96641 Presence of right artificial hip joint: Secondary | ICD-10-CM | POA: Diagnosis not present

## 2024-04-07 DIAGNOSIS — L728 Other follicular cysts of the skin and subcutaneous tissue: Secondary | ICD-10-CM | POA: Diagnosis not present

## 2024-04-07 DIAGNOSIS — M48062 Spinal stenosis, lumbar region with neurogenic claudication: Secondary | ICD-10-CM | POA: Diagnosis not present

## 2024-04-07 DIAGNOSIS — I872 Venous insufficiency (chronic) (peripheral): Secondary | ICD-10-CM | POA: Diagnosis not present

## 2024-04-07 MED ORDER — CEFDINIR 300 MG PO CAPS: 600.0000 mg | ORAL_CAPSULE | Freq: Every day | ORAL | 0 refills | Status: AC

## 2024-04-07 NOTE — Patient Outreach (Signed)
 RNCM - returned call from patient's daughter Olam Remington and conference call including patient. Both requesting antibiotic in advance of urine specimen being collected. Patient confirmed [facility] did not collect urine yesterday. Patient with urinary frequency every hour and incontinence, also sx of fever. No blood in urine, no flank pain or confusion/weakness. RNCM sent priority secure message to PCP Dr. Gasper with patient request and notified that urine has not been collected. Plan- RNCM will call daughter back when info received from PCP office and she will update patient.

## 2024-04-07 NOTE — Patient Outreach (Signed)
 RNCM - spoke with daughter Olam Remington - reported to call from [facility], Nephrology did call in antibiotic for patient and will start asap. Reported no call yet from [facility] re DM shoes - sending message to CMA Hargis Jubilee - daughter spoke with her re shoes and order.

## 2024-04-08 ENCOUNTER — Telehealth: Payer: Self-pay

## 2024-04-08 NOTE — Telephone Encounter (Signed)
 Ok for PT?

## 2024-04-08 NOTE — Telephone Encounter (Unsigned)
 Copied from CRM [id number]. Topic: Clinical - Medical Advice >> Apr 07, 2024  3:36 PM Carlatta H wrote: Reason for CRM: Mark Terry [phone] needs a call back from [place]

## 2024-04-08 NOTE — Telephone Encounter (Unsigned)
 Copied from CRM #[id number]. Topic: Clinical - Home Health Verbal Orders >> Apr 07, 2024  4:15 PM Myrick T wrote: Caller/Agency: Glade Lenis from [facility] Number: [phone] Service Requested: Physical Therapy Frequency: 1x8w Any new concerns about the patient? Yes Dr Douglas called in antibiotic for possibl UTI

## 2024-04-09 ENCOUNTER — Other Ambulatory Visit: Payer: Self-pay

## 2024-04-09 DIAGNOSIS — K760 Fatty (change of) liver, not elsewhere classified: Secondary | ICD-10-CM | POA: Diagnosis not present

## 2024-04-09 DIAGNOSIS — J449 Chronic obstructive pulmonary disease, unspecified: Secondary | ICD-10-CM | POA: Diagnosis not present

## 2024-04-09 DIAGNOSIS — L728 Other follicular cysts of the skin and subcutaneous tissue: Secondary | ICD-10-CM | POA: Diagnosis not present

## 2024-04-09 DIAGNOSIS — Z4800 Encounter for change or removal of nonsurgical wound dressing: Secondary | ICD-10-CM | POA: Diagnosis not present

## 2024-04-09 DIAGNOSIS — E7800 Pure hypercholesterolemia, unspecified: Secondary | ICD-10-CM | POA: Diagnosis not present

## 2024-04-09 DIAGNOSIS — Z85828 Personal history of other malignant neoplasm of skin: Secondary | ICD-10-CM | POA: Diagnosis not present

## 2024-04-09 DIAGNOSIS — Z96611 Presence of right artificial shoulder joint: Secondary | ICD-10-CM | POA: Diagnosis not present

## 2024-04-09 DIAGNOSIS — Z981 Arthrodesis status: Secondary | ICD-10-CM | POA: Diagnosis not present

## 2024-04-09 DIAGNOSIS — D631 Anemia in chronic kidney disease: Secondary | ICD-10-CM | POA: Diagnosis not present

## 2024-04-09 DIAGNOSIS — I130 Hypertensive heart and chronic kidney disease with heart failure and stage 1 through stage 4 chronic kidney disease, or unspecified chronic kidney disease: Secondary | ICD-10-CM | POA: Diagnosis not present

## 2024-04-09 DIAGNOSIS — N183 Chronic kidney disease, stage 3 unspecified: Secondary | ICD-10-CM | POA: Diagnosis not present

## 2024-04-09 DIAGNOSIS — I5032 Chronic diastolic (congestive) heart failure: Secondary | ICD-10-CM | POA: Diagnosis not present

## 2024-04-09 DIAGNOSIS — M48062 Spinal stenosis, lumbar region with neurogenic claudication: Secondary | ICD-10-CM | POA: Diagnosis not present

## 2024-04-09 DIAGNOSIS — E1122 Type 2 diabetes mellitus with diabetic chronic kidney disease: Secondary | ICD-10-CM | POA: Diagnosis not present

## 2024-04-09 DIAGNOSIS — K219 Gastro-esophageal reflux disease without esophagitis: Secondary | ICD-10-CM | POA: Diagnosis not present

## 2024-04-09 DIAGNOSIS — Z96612 Presence of left artificial shoulder joint: Secondary | ICD-10-CM | POA: Diagnosis not present

## 2024-04-09 DIAGNOSIS — Z9181 History of falling: Secondary | ICD-10-CM | POA: Diagnosis not present

## 2024-04-09 DIAGNOSIS — Z79891 Long term (current) use of opiate analgesic: Secondary | ICD-10-CM | POA: Diagnosis not present

## 2024-04-09 DIAGNOSIS — I872 Venous insufficiency (chronic) (peripheral): Secondary | ICD-10-CM | POA: Diagnosis not present

## 2024-04-09 DIAGNOSIS — R339 Retention of urine, unspecified: Secondary | ICD-10-CM | POA: Diagnosis not present

## 2024-04-09 DIAGNOSIS — I25119 Atherosclerotic heart disease of native coronary artery with unspecified angina pectoris: Secondary | ICD-10-CM | POA: Diagnosis not present

## 2024-04-09 DIAGNOSIS — Z96641 Presence of right artificial hip joint: Secondary | ICD-10-CM | POA: Diagnosis not present

## 2024-04-09 DIAGNOSIS — Z87891 Personal history of nicotine dependence: Secondary | ICD-10-CM | POA: Diagnosis not present

## 2024-04-09 DIAGNOSIS — Z7984 Long term (current) use of oral hypoglycemic drugs: Secondary | ICD-10-CM | POA: Diagnosis not present

## 2024-04-09 NOTE — Progress Notes (Signed)
 I filled out order and sent to medical records a few days ago. Don't know if it's been faxed yet.

## 2024-04-09 NOTE — Patient Outreach (Signed)
 Complex Care Management   Visit Note  04/09/2024  Name:  Mark Terry. MRN: [id number] DOB: 1934-10-12  Situation: Referral received for Complex Care Management related to Heart Failure, Diabetes with Complications, and Chronic Kidney Disease I obtained verbal consent from Patient.  Visit completed with Patient Daughter Olam Remington  on the phone  Background:   Past Medical History:  Diagnosis Date   Anemia    Bruises easily    Diabetes mellitus without complication (HCC)    H/O esophagogastroduodenoscopy    H/O hiatal hernia    History of blood transfusion    no reaction noted   History of bronchitis    last time a couple of yrs ago   History of gastric ulcer    History of MRSA infection    Hyperlipidemia    Hypertension    Leg cramps    takes quinine  prn   Melanoma (HCC)    Panic attacks    Pneumonia    hx of last time a couple of yrs ago   PONV (postoperative nausea and vomiting)    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   Diagnosis: Squamous cell Carcinoma Location: Mid superior forehead Pre- operative size: 1cm x 1cm Total stages: 1 Post-Operative Size: 2.3cm x 2.1cm Date of Surgery: 05/01/2019    Assessment: Patient Reported Symptoms:  Cognitive        Neurological Neurological Review of Symptoms: Numbness, Dizziness Neurological Management Strategies: Routine screening, Medication therapy Neurological Comment: neuropathy, dizzy when first getting up, safety precautions discussed,  HEENT        Cardiovascular Cardiovascular Symptoms Reported: Swelling in legs or feet Weight: 206 lb (93.4 kg) Cardiovascular Comment: Unna boots - weekly wrappings, legs improved a lot per daughter, wound care monthly, daughter uses MyChart - will mail CHF AP as disussed  Respiratory Respiratory Symptoms Reported: No symptoms reported Respiratory Management Strategies: Routine screening  Endocrine Endocrine Symptoms Reported: No symptoms reported Is patient  diabetic?: Yes Is patient checking blood sugars at home?: Yes List most recent blood sugar readings, include date and time of day: FBG today 192 - reports continues to be elevated with UTI, ranging 160-203, no sx high/low    Gastrointestinal Gastrointestinal Symptoms Reported: No symptoms reported      Genitourinary Additional Genitourinary Details: received Cefdir from nephrology aware to complete all of med, reported feeling better, less dicomfort and frequency no sx of fever,    Integumentary Integumentary Symptoms Reported: No symptoms reported Additional Integumentary Details: legs improved, no open areas Skin Management Strategies: Dressing changes, Routine screening  Musculoskeletal Musculoskelatal Symptoms Reviewed: Back pain, Difficulty walking, Limited mobility, Muscle pain, Unsteady gait, Weakness Additional Musculoskeletal Details: wheelchair most of the time, getting PT, rollator as needed Musculoskeletal Management Strategies: Exercise, Routine screening Falls in the past year?: Yes Number of falls in past year: 2 or more Was there an injury with Fall?: No Fall Risk Category Calculator: 2 Patient Fall Risk Level: Moderate Fall Risk Patient at Risk for Falls Due to: History of fall(s), Impaired balance/gait, Impaired mobility Fall risk Follow up: Falls evaluation completed, Falls prevention discussed, Education provided  Psychosocial Psychosocial Symptoms Reported: Depression - if selected complete PHQ 2-9 Additional Psychological Details: occasional depression for a day then better Behavioral Management Strategies: Support system, Adequate rest Behavioral Health Self-Management Outcome: 4 (good) Techniques to Cope with Loss/Stress/Change: Diversional activities, Exercise, Spiritual practice(s)      04/09/2024    PHQ2-9 Depression Screening   Little interest or pleasure in doing  things Not at all  Feeling down, depressed, or hopeless Not at all  PHQ-2 - Total Score 0   Trouble falling or staying asleep, or sleeping too much    Feeling tired or having little energy    Poor appetite or overeating     Feeling bad about yourself - or that you are a failure or have let yourself or your family down    Trouble concentrating on things, such as reading the newspaper or watching television    Moving or speaking so slowly that other people could have noticed.  Or the opposite - being so fidgety or restless that you have been moving around a lot more than usual    Thoughts that you would be better off dead, or hurting yourself in some way    PHQ2-9 Total Score    If you checked off any problems, how difficult have these problems made it for you to do your work, take care of things at home, or get along with other people    Depression Interventions/Treatment      Vitals:   04/09/24 0945  BP: (!) 103/57  Pulse: 70    Medications Reviewed Today     Reviewed by Devra Lands, RN (Registered Nurse) on 04/09/24 at [phone]  Med List Status: <None>   Medication Order Taking? Sig Documenting Provider Last Dose Status Informant  ACCU-CHEK GUIDE TEST test strip [id number]  CHECK BLOOD SUGAR ONCE DAILY FOR TYPE 2 DIABETES Gasper Nancyann BRAVO, MD  Active   alfuzosin  (UROXATRAL ) 10 MG 24 hr tablet [id number]  Take 1 tablet (10 mg total) by mouth daily with breakfast. Take in place of doxazosin  Gasper Nancyann BRAVO, MD  Active   ascorbic acid  (VITAMIN C ) 500 MG tablet [id number]  Take 1 tablet (500 mg total) by mouth 2 (two) times daily. Patel, Sona, MD  Active   aspirin  81 MG tablet [id number]  Take 81 mg by mouth daily. [provider]  Active Self           Med Note HALLIE, FELECIA N   Tue Oct 11, 2020 11:09 AM) Reports taking every other day.  cefdinir  (OMNICEF ) 300 MG capsule [id number]  Take 2 capsules (600 mg total) by mouth daily for 7 days. Gasper Nancyann BRAVO, MD  Active   DULoxetine  (CYMBALTA ) 30 MG capsule [id number]  TAKE 1 CAPSULE BY MOUTH EVERY DAY Gasper Nancyann BRAVO, MD   Active   empagliflozin  (JARDIANCE ) 25 MG TABS tablet [id number]  TAKE 1 TABLET BY MOUTH DAILY  Patient not taking: Reported on 04/09/2024   Gasper Nancyann BRAVO, MD  Active            Med Note LESLY, RICHERD CINDERELLA Schaumann Feb 06, 2024 10:38 AM) Restarted per Nephrology  empagliflozin  (JARDIANCE ) 25 MG TABS tablet [id number]  Take 25 mg by mouth daily. [provider]  Active   feeding supplement (ENSURE PLUS HIGH PROTEIN) LIQD [id number]  Take 237 mLs by mouth 2 (two) times daily between meals.  Patient not taking: Reported on 04/06/2024   Patel, Sona, MD  Active   fluticasone  (FLONASE ) 50 MCG/ACT nasal spray [id number]  USE 1 TO 2 SPRAYS IN BOTH  NOSTRILS DAILY Gasper Nancyann BRAVO, MD  Active   furosemide  (LASIX ) 40 MG tablet [id number]  TAKE 1 TABLET BY MOUTH DAILY  Patient taking differently: Take 40 mg by mouth daily. Takes prn only for > 3 lb weight gain   Gasper Nancyann BRAVO, MD  Active  gabapentin  (NEURONTIN ) 300 MG capsule [id number]  TAKE 1 CAPSULE BY MOUTH 3 TIMES  DAILY  Patient taking differently: Take 300 mg by mouth 3 (three) times daily. Reports sometimes only takes BID - helps him sleep   Gasper Nancyann BRAVO, MD  Active   glimepiride  (AMARYL ) 1 MG tablet [id number]  TAKE 1 TABLET BY MOUTH DAILY Gasper Nancyann BRAVO, MD  Active   HYDROcodone -acetaminophen  (NORCO) 7.5-325 MG tablet [id number]  Take 1 tablet by mouth 2 (two) times daily. [provider]  Active   HYDROcodone -acetaminophen  (NORCO/VICODIN) 5-325 MG tablet [id number]  TAKE 1 TABLET BY MOUTH TWICE DAILY AS NEEDED  Patient taking differently: Patient minimizes doses 1/2 am, 1/2 during afternoon as needed and 1 at night   [provider]  Active   levothyroxine  (SYNTHROID ) 50 MCG tablet [id number]  TAKE 1 TABLET BY MOUTH DAILY Gasper Nancyann BRAVO, MD  Active   loratadine  (CLARITIN ) 10 MG tablet [id number]  Take 10 mg by mouth daily. [provider]  Active Self  losartan  (COZAAR ) 25 MG tablet [id number]  Take 25 mg by  mouth daily. [provider]  Active   metoprolol  tartrate (LOPRESSOR ) 25 MG tablet [id number]  TAKE 1 TABLET BY MOUTH TWICE  DAILY Gasper Nancyann BRAVO, MD  Active   Multiple Vitamin (MULTIVITAMIN WITH MINERALS) TABS [id number]  Take 1 tablet by mouth daily. [provider]  Active Self  omeprazole  (PRILOSEC) 40 MG capsule [id number]  TAKE 1 CAPSULE BY MOUTH DAILY Gasper Nancyann BRAVO, MD  Active   oxybutynin  (DITROPAN ) 5 MG tablet [id number]  TAKE 1 TABLET BY MOUTH TWICE  DAILY Gasper Nancyann BRAVO, MD  Active   simvastatin  (ZOCOR ) 40 MG tablet [id number]  TAKE 1 TABLET BY MOUTH AT  BEDTIME Gasper Nancyann BRAVO, MD  Active   spironolactone (ALDACTONE) 25 MG tablet [id number]  Take 25 mg by mouth. [provider]  Active   traZODone  (DESYREL ) 100 MG tablet [id number]  TAKE 1/2 TO 1 TABLET BY MOUTH AT BEDTIME AS NEEDED. FOR SLEEP Gasper Nancyann BRAVO, MD  Active   Vitamin D , Ergocalciferol , (DRISDOL ) 1.25 MG (50000 UNIT) CAPS capsule [id number]  Take 50,000 Units by mouth every 7 (seven) days. [provider]  Active   Med List Note Wendelyn Pulling, RN 08/26/18 9147): Medication agreement and Opiod contract signed 08/26/18 MR 10/31/18 UDS 08/26/18 Serum drug screen 05/30/19            Recommendation:   PCP Follow-up Continue Current Plan of Care  Follow Up Plan:   Telephone follow-up in 1 month  Nestora Duos, MSN, RN [facility]  [facility], [facility] RN Care Manager Direct Dial: [phone] Fax: [phone]

## 2024-04-09 NOTE — Patient Instructions (Signed)
 Visit Information  Thank you for taking time to visit with me today. Please don't hesitate to contact me if I can be of assistance to you before our next scheduled appointment.  Your next care management appointment is by telephone on 05/05/24 at 10:00 am  Telephone follow-up in 1 month  Please call the care guide team at [phone] if you need to cancel, schedule, or reschedule an appointment.   Please call the Suicide and Crisis Lifeline: 988 call the [place]  National Suicide Prevention Lifeline: 2241864021 or TTY: 431-788-5685 TTY (205)152-6197) to talk to a trained counselor call 1-800-273-TALK (toll free, 24 hour hotline) go to [facility] [address] [place] [phone]) call 911 if you are experiencing a Mental Health or Behavioral Health Crisis or need someone to talk to.  Nestora Duos, MSN, RN [facility], [facility] RN Care Manager Direct Dial: [phone] Fax: [phone]   Heart Failure: How to Manage Heart failure is a long-term condition where your heart can't pump enough blood through your body. When this happens, parts of your body don't get the blood and oxygen they need. There's no cure for heart failure, so it's important to take good care of yourself and follow the treatment plan you set with your health care provider. If you're living with heart failure, there are ways to help you manage the disease. How to manage lifestyle changes Living with heart failure requires you to make changes in your daily life. Your health care team will teach you about the changes you need to make. These changes can help improve your symptoms and lower your risk of going to the hospital. Work with your provider to create a plan for you. Activity Ask your provider about cardiac rehabilitation programs. These programs include physical activity. If no cardiac rehab program is available, ask your  provider what exercises are safe for you to do. Ask what things are safe for you to do at home. Ask when you can go back to work or school. Pace your daily activities and allow time for rest. Managing stress It's normal to have many emotions when you're told you have heart failure. You may have fear, sadness, and anger. If you need help coping with any of these emotions, let your provider know. Here are some ways to help yourself manage these emotions: Talk to your friends and family about your condition. They can give you support and guidance. Explain your symptoms to them. If comfortable, you can invite them to attend appointments with you. Join a support group for people with heart failure. Talking with others who have the same symptoms may give you new ways of coping with your disease and your emotions. Accept help from others. Do not be ashamed if you need help. Use stress management techniques. Try things like meditation, breathing exercises, or listening to relaxing music. Conditions like depression and anxiety are common in people with heart failure. Pay attention to changes in your mood, emotions, and stress levels. Tell your provider if you have any of the following symptoms: Trouble sleeping or a change in your sleeping patterns. Feeling sad or depressed. Losing interest in activities you normally enjoy. Feeling irritable or crying for no reason. Often worrying about the future. Work If heart failure affects your ability to work, you may need to talk with your provider about making a plan for changes. This may include: Reducing your work hours. Finding tasks that require less effort. Planning rest periods  during work hours. Travel Talk with your provider if you plan to travel. There may be times when your provider suggests that you don't travel or you wait until your condition has improved. Bring your medicine and a list of your medicines. If you're traveling by public  transportation (airplane, train, bus), keep your medicines with you in a carry-on bag. If you have special needs, contact the transportation company prior to traveling. This may include needs related to diet, oxygen, a wheelchair, a seating request, or help with luggage. Think about finding a medical facility in the area you'll be traveling to. Find out what your health insurance will cover. If you use oxygen, make sure you bring enough oxygen with you. If you have a battery-powered oxygen device, bring a fully charged extra battery with you. If you have an implanted device, bring a note from your provider and tell the security screening workers that you have the device. You may need to go through special screening. Sexual activity  Ask your provider when it's safe for you to resume sexual activity. You may need to start slowly and increase intimacy over time. Regular exercise can benefit your sex life by building strength and endurance. Sleep If your condition affects your sleep, find ways to improve how well you sleep at night. These tips may help: Sleep lying on your side, or sleep with your head raised. Try: Raising the head of your bed. Using more than one pillow. Ask your provider about screening for sleep apnea. Try to go to sleep and wake up at the same times every day. Sleep in a dark, cool room. Do not exercise or eat for a few hours before bedtime. Plan rest periods during the day. But don't take long naps.  Where to find support In addition to talking with your family or friends, consider talking with: A mental health professional or therapist. A member of your church, faith, or community group. Other sources of support include: Local support groups. Ask your provider about groups near you. Online support groups, such as those found through: Heart Failure Society of America: hfsa.org American Heart Association: supportnetwork.heart.org Local community agencies or social  agencies. A palliative care specialist. Palliative care can help manage symptoms, promote comfort, improve quality of life, and maintain dignity. Where to find more information To learn more, go to these websites: Centers for Disease Control and Prevention at TonerPromos.no. Then: Click Health Topics. Type heart failure in the search box. American Heart Association: heart.org National Heart, Lung, and Blood Institute: BuffaloDryCleaner.gl Contact a health care provider if: You have trouble breathing when you're active. You have swelling in your feet or legs. You gain 2-3 lb (0.9-1.4 kg) in 24 hours, or 5 lb (2.3 kg) in a week. You have a dry cough. You're not able to take part in your usual physical activities. You feel dizzy or light-headed when you stand up. You have trouble sleeping. You have a decrease in appetite. You have symptoms of depression or anxiety. Get help right away if: You have trouble breathing when resting. You have trouble staying awake or you feel confused. You have chest pain. You faint. You have fast or irregular heartbeats. These symptoms may be an emergency. Call 911 right away. Do not wait to see if the symptoms will go away. Do not drive yourself to the hospital. This information is not intended to replace advice given to you by your health care provider. Make sure you discuss any questions you have with your  health care provider. Document Revised: 02/07/2023 Document Reviewed: 02/07/2023 Elsevier Patient Education  2024 Elsevier Inc.  Heart Failure Action Plan A heart failure action plan helps you know what to do when you have symptoms of heart failure. Your action plan is a color-coded plan that lists the symptoms to watch for and indicates what actions to take. If you have symptoms in the green zone, you're doing well. If you have symptoms in the yellow zone, you're having problems. If you have symptoms in the red zone, you need medical care right away. Follow  the plan that was created by you and your health care provider. Review your plan each time you visit your provider. Green zone These signs mean you're doing well and can continue what you're doing: You don't have new or worsening shortness of breath. You have very little swelling or no new swelling. Your weight is stable (no gain or loss). You have a normal activity level. You don't have chest pain or any other new symptoms. Yellow zone These signs and symptoms mean your condition may be getting worse and you should make some changes: You have trouble breathing when you're active. You have swelling in your feet or legs or have discomfort in your belly. You gain 2-3 lb (0.9-1.4 kg) in 24 hours, or 5 lb (2.3 kg) in a week. This amount may be more or less depending on your condition. You get tired easily. You have trouble sleeping. You have a dry cough. If you have any of these symptoms: Contact your provider within the next day. Your provider may adjust your medicines. Red zone These signs and symptoms mean you should get medical help right away: You have trouble breathing when resting or cannot lie flat and you need to raise your head to help you breathe. You have a dry cough that's getting worse. You have swelling or pain in your feet or legs or discomfort in your belly that's getting worse. You suddenly gain more than 2-3 lb (0.9-1.4 kg) in 24 hours, or more than 5 lb (2.3 kg) in a week. This amount may be more or less depending on your condition. You have trouble staying awake or you feel confused. You don't have an appetite. You have worsening sadness or depression. These symptoms may be an emergency. Call 911 right away. Do not wait to see if the symptoms will go away. Do not drive yourself to the hospital. Follow these instructions at home: Take medicines only as told. Eat a heart-healthy diet. Work with a dietitian to create an eating plan that's best for you. Weigh yourself  each day. Your target weight is __________ lb (__________ kg). Call your provider if you gain more than __________ lb (__________ kg) in 24 hours, or more than __________ lb (__________ kg) in a week. Health care provider name: _____________________________________________________ Health care provider phone number: _____________________________________________________ Where to find more information American Heart Association: heart.org This information is not intended to replace advice given to you by your health care provider. Make sure you discuss any questions you have with your health care provider. Document Revised: 02/07/2023 Document Reviewed: 02/07/2023 Elsevier Patient Education  2024 [employer].

## 2024-04-10 ENCOUNTER — Telehealth: Payer: Self-pay

## 2024-04-10 DIAGNOSIS — I5032 Chronic diastolic (congestive) heart failure: Secondary | ICD-10-CM | POA: Diagnosis not present

## 2024-04-10 DIAGNOSIS — Z85828 Personal history of other malignant neoplasm of skin: Secondary | ICD-10-CM | POA: Diagnosis not present

## 2024-04-10 DIAGNOSIS — I130 Hypertensive heart and chronic kidney disease with heart failure and stage 1 through stage 4 chronic kidney disease, or unspecified chronic kidney disease: Secondary | ICD-10-CM | POA: Diagnosis not present

## 2024-04-10 DIAGNOSIS — D631 Anemia in chronic kidney disease: Secondary | ICD-10-CM | POA: Diagnosis not present

## 2024-04-10 DIAGNOSIS — Z79891 Long term (current) use of opiate analgesic: Secondary | ICD-10-CM | POA: Diagnosis not present

## 2024-04-10 DIAGNOSIS — E7800 Pure hypercholesterolemia, unspecified: Secondary | ICD-10-CM | POA: Diagnosis not present

## 2024-04-10 DIAGNOSIS — Z96612 Presence of left artificial shoulder joint: Secondary | ICD-10-CM | POA: Diagnosis not present

## 2024-04-10 DIAGNOSIS — L728 Other follicular cysts of the skin and subcutaneous tissue: Secondary | ICD-10-CM | POA: Diagnosis not present

## 2024-04-10 DIAGNOSIS — N183 Chronic kidney disease, stage 3 unspecified: Secondary | ICD-10-CM | POA: Diagnosis not present

## 2024-04-10 DIAGNOSIS — K760 Fatty (change of) liver, not elsewhere classified: Secondary | ICD-10-CM | POA: Diagnosis not present

## 2024-04-10 DIAGNOSIS — I872 Venous insufficiency (chronic) (peripheral): Secondary | ICD-10-CM | POA: Diagnosis not present

## 2024-04-10 DIAGNOSIS — Z7984 Long term (current) use of oral hypoglycemic drugs: Secondary | ICD-10-CM | POA: Diagnosis not present

## 2024-04-10 DIAGNOSIS — Z4800 Encounter for change or removal of nonsurgical wound dressing: Secondary | ICD-10-CM | POA: Diagnosis not present

## 2024-04-10 DIAGNOSIS — Z96641 Presence of right artificial hip joint: Secondary | ICD-10-CM | POA: Diagnosis not present

## 2024-04-10 DIAGNOSIS — M48062 Spinal stenosis, lumbar region with neurogenic claudication: Secondary | ICD-10-CM | POA: Diagnosis not present

## 2024-04-10 DIAGNOSIS — E1122 Type 2 diabetes mellitus with diabetic chronic kidney disease: Secondary | ICD-10-CM | POA: Diagnosis not present

## 2024-04-10 DIAGNOSIS — Z981 Arthrodesis status: Secondary | ICD-10-CM | POA: Diagnosis not present

## 2024-04-10 DIAGNOSIS — J449 Chronic obstructive pulmonary disease, unspecified: Secondary | ICD-10-CM | POA: Diagnosis not present

## 2024-04-10 DIAGNOSIS — R339 Retention of urine, unspecified: Secondary | ICD-10-CM | POA: Diagnosis not present

## 2024-04-10 DIAGNOSIS — Z87891 Personal history of nicotine dependence: Secondary | ICD-10-CM | POA: Diagnosis not present

## 2024-04-10 DIAGNOSIS — I25119 Atherosclerotic heart disease of native coronary artery with unspecified angina pectoris: Secondary | ICD-10-CM | POA: Diagnosis not present

## 2024-04-10 DIAGNOSIS — Z96611 Presence of right artificial shoulder joint: Secondary | ICD-10-CM | POA: Diagnosis not present

## 2024-04-10 DIAGNOSIS — Z9181 History of falling: Secondary | ICD-10-CM | POA: Diagnosis not present

## 2024-04-10 DIAGNOSIS — K219 Gastro-esophageal reflux disease without esophagitis: Secondary | ICD-10-CM | POA: Diagnosis not present

## 2024-04-10 NOTE — Telephone Encounter (Signed)
 I filled out orders for those on Monday or Wednesday.... don't remember exactly which day, and sent to medical records to be faxed with office notes to [facility].

## 2024-04-10 NOTE — Telephone Encounter (Signed)
 Advised

## 2024-04-10 NOTE — Telephone Encounter (Signed)
Spoke to Mark Terry

## 2024-04-10 NOTE — Telephone Encounter (Signed)
 Verbals given and they wanted to make Dr.fisher aware he was given a abx from nephrology

## 2024-04-10 NOTE — Telephone Encounter (Signed)
 Dr Gasper, Do you have anything on patient regarding diabetic shoes.  Daughter states that order has not been received

## 2024-04-13 DIAGNOSIS — B351 Tinea unguium: Secondary | ICD-10-CM | POA: Diagnosis not present

## 2024-04-13 DIAGNOSIS — E119 Type 2 diabetes mellitus without complications: Secondary | ICD-10-CM | POA: Diagnosis not present

## 2024-04-13 NOTE — Telephone Encounter (Signed)
 2 different type of forms were faxed on 10/1 or 04/09/24 to [employer].

## 2024-04-14 DIAGNOSIS — Z85828 Personal history of other malignant neoplasm of skin: Secondary | ICD-10-CM | POA: Diagnosis not present

## 2024-04-14 DIAGNOSIS — J449 Chronic obstructive pulmonary disease, unspecified: Secondary | ICD-10-CM | POA: Diagnosis not present

## 2024-04-14 DIAGNOSIS — I130 Hypertensive heart and chronic kidney disease with heart failure and stage 1 through stage 4 chronic kidney disease, or unspecified chronic kidney disease: Secondary | ICD-10-CM | POA: Diagnosis not present

## 2024-04-14 DIAGNOSIS — Z9181 History of falling: Secondary | ICD-10-CM | POA: Diagnosis not present

## 2024-04-14 DIAGNOSIS — R339 Retention of urine, unspecified: Secondary | ICD-10-CM | POA: Diagnosis not present

## 2024-04-14 DIAGNOSIS — E1122 Type 2 diabetes mellitus with diabetic chronic kidney disease: Secondary | ICD-10-CM | POA: Diagnosis not present

## 2024-04-14 DIAGNOSIS — Z981 Arthrodesis status: Secondary | ICD-10-CM | POA: Diagnosis not present

## 2024-04-14 DIAGNOSIS — L728 Other follicular cysts of the skin and subcutaneous tissue: Secondary | ICD-10-CM | POA: Diagnosis not present

## 2024-04-14 DIAGNOSIS — E7800 Pure hypercholesterolemia, unspecified: Secondary | ICD-10-CM | POA: Diagnosis not present

## 2024-04-14 DIAGNOSIS — Z87891 Personal history of nicotine dependence: Secondary | ICD-10-CM | POA: Diagnosis not present

## 2024-04-14 DIAGNOSIS — I872 Venous insufficiency (chronic) (peripheral): Secondary | ICD-10-CM | POA: Diagnosis not present

## 2024-04-14 DIAGNOSIS — Z96612 Presence of left artificial shoulder joint: Secondary | ICD-10-CM | POA: Diagnosis not present

## 2024-04-14 DIAGNOSIS — M48062 Spinal stenosis, lumbar region with neurogenic claudication: Secondary | ICD-10-CM | POA: Diagnosis not present

## 2024-04-14 DIAGNOSIS — Z79891 Long term (current) use of opiate analgesic: Secondary | ICD-10-CM | POA: Diagnosis not present

## 2024-04-14 DIAGNOSIS — Z7984 Long term (current) use of oral hypoglycemic drugs: Secondary | ICD-10-CM | POA: Diagnosis not present

## 2024-04-14 DIAGNOSIS — D631 Anemia in chronic kidney disease: Secondary | ICD-10-CM | POA: Diagnosis not present

## 2024-04-14 DIAGNOSIS — Z96641 Presence of right artificial hip joint: Secondary | ICD-10-CM | POA: Diagnosis not present

## 2024-04-14 DIAGNOSIS — Z96611 Presence of right artificial shoulder joint: Secondary | ICD-10-CM | POA: Diagnosis not present

## 2024-04-14 DIAGNOSIS — I25119 Atherosclerotic heart disease of native coronary artery with unspecified angina pectoris: Secondary | ICD-10-CM | POA: Diagnosis not present

## 2024-04-14 DIAGNOSIS — K219 Gastro-esophageal reflux disease without esophagitis: Secondary | ICD-10-CM | POA: Diagnosis not present

## 2024-04-14 DIAGNOSIS — Z4800 Encounter for change or removal of nonsurgical wound dressing: Secondary | ICD-10-CM | POA: Diagnosis not present

## 2024-04-14 DIAGNOSIS — I5032 Chronic diastolic (congestive) heart failure: Secondary | ICD-10-CM | POA: Diagnosis not present

## 2024-04-14 DIAGNOSIS — N183 Chronic kidney disease, stage 3 unspecified: Secondary | ICD-10-CM | POA: Diagnosis not present

## 2024-04-14 DIAGNOSIS — K760 Fatty (change of) liver, not elsewhere classified: Secondary | ICD-10-CM | POA: Diagnosis not present

## 2024-04-15 DIAGNOSIS — Z96641 Presence of right artificial hip joint: Secondary | ICD-10-CM | POA: Diagnosis not present

## 2024-04-15 DIAGNOSIS — K219 Gastro-esophageal reflux disease without esophagitis: Secondary | ICD-10-CM | POA: Diagnosis not present

## 2024-04-15 DIAGNOSIS — Z4800 Encounter for change or removal of nonsurgical wound dressing: Secondary | ICD-10-CM | POA: Diagnosis not present

## 2024-04-15 DIAGNOSIS — E7800 Pure hypercholesterolemia, unspecified: Secondary | ICD-10-CM | POA: Diagnosis not present

## 2024-04-15 DIAGNOSIS — I130 Hypertensive heart and chronic kidney disease with heart failure and stage 1 through stage 4 chronic kidney disease, or unspecified chronic kidney disease: Secondary | ICD-10-CM | POA: Diagnosis not present

## 2024-04-15 DIAGNOSIS — R339 Retention of urine, unspecified: Secondary | ICD-10-CM | POA: Diagnosis not present

## 2024-04-15 DIAGNOSIS — N183 Chronic kidney disease, stage 3 unspecified: Secondary | ICD-10-CM | POA: Diagnosis not present

## 2024-04-15 DIAGNOSIS — I5032 Chronic diastolic (congestive) heart failure: Secondary | ICD-10-CM | POA: Diagnosis not present

## 2024-04-15 DIAGNOSIS — Z85828 Personal history of other malignant neoplasm of skin: Secondary | ICD-10-CM | POA: Diagnosis not present

## 2024-04-15 DIAGNOSIS — K760 Fatty (change of) liver, not elsewhere classified: Secondary | ICD-10-CM | POA: Diagnosis not present

## 2024-04-15 DIAGNOSIS — Z96611 Presence of right artificial shoulder joint: Secondary | ICD-10-CM | POA: Diagnosis not present

## 2024-04-15 DIAGNOSIS — D631 Anemia in chronic kidney disease: Secondary | ICD-10-CM | POA: Diagnosis not present

## 2024-04-15 DIAGNOSIS — I25119 Atherosclerotic heart disease of native coronary artery with unspecified angina pectoris: Secondary | ICD-10-CM | POA: Diagnosis not present

## 2024-04-15 DIAGNOSIS — Z981 Arthrodesis status: Secondary | ICD-10-CM | POA: Diagnosis not present

## 2024-04-15 DIAGNOSIS — Z9181 History of falling: Secondary | ICD-10-CM | POA: Diagnosis not present

## 2024-04-15 DIAGNOSIS — M48062 Spinal stenosis, lumbar region with neurogenic claudication: Secondary | ICD-10-CM | POA: Diagnosis not present

## 2024-04-15 DIAGNOSIS — L728 Other follicular cysts of the skin and subcutaneous tissue: Secondary | ICD-10-CM | POA: Diagnosis not present

## 2024-04-15 DIAGNOSIS — Z96612 Presence of left artificial shoulder joint: Secondary | ICD-10-CM | POA: Diagnosis not present

## 2024-04-15 DIAGNOSIS — Z7984 Long term (current) use of oral hypoglycemic drugs: Secondary | ICD-10-CM | POA: Diagnosis not present

## 2024-04-15 DIAGNOSIS — E1122 Type 2 diabetes mellitus with diabetic chronic kidney disease: Secondary | ICD-10-CM | POA: Diagnosis not present

## 2024-04-15 DIAGNOSIS — I872 Venous insufficiency (chronic) (peripheral): Secondary | ICD-10-CM | POA: Diagnosis not present

## 2024-04-15 DIAGNOSIS — J449 Chronic obstructive pulmonary disease, unspecified: Secondary | ICD-10-CM | POA: Diagnosis not present

## 2024-04-15 DIAGNOSIS — Z79891 Long term (current) use of opiate analgesic: Secondary | ICD-10-CM | POA: Diagnosis not present

## 2024-04-15 DIAGNOSIS — Z87891 Personal history of nicotine dependence: Secondary | ICD-10-CM | POA: Diagnosis not present

## 2024-04-16 ENCOUNTER — Encounter: Attending: Physician Assistant | Admitting: Physician Assistant

## 2024-04-16 DIAGNOSIS — E1122 Type 2 diabetes mellitus with diabetic chronic kidney disease: Secondary | ICD-10-CM | POA: Insufficient documentation

## 2024-04-16 DIAGNOSIS — I890 Lymphedema, not elsewhere classified: Secondary | ICD-10-CM | POA: Diagnosis not present

## 2024-04-16 DIAGNOSIS — L97412 Non-pressure chronic ulcer of right heel and midfoot with fat layer exposed: Secondary | ICD-10-CM | POA: Insufficient documentation

## 2024-04-16 DIAGNOSIS — I87333 Chronic venous hypertension (idiopathic) with ulcer and inflammation of bilateral lower extremity: Secondary | ICD-10-CM | POA: Diagnosis not present

## 2024-04-16 DIAGNOSIS — L97822 Non-pressure chronic ulcer of other part of left lower leg with fat layer exposed: Secondary | ICD-10-CM | POA: Diagnosis not present

## 2024-04-16 DIAGNOSIS — L97522 Non-pressure chronic ulcer of other part of left foot with fat layer exposed: Secondary | ICD-10-CM | POA: Diagnosis not present

## 2024-04-16 DIAGNOSIS — L988 Other specified disorders of the skin and subcutaneous tissue: Secondary | ICD-10-CM | POA: Diagnosis not present

## 2024-04-16 DIAGNOSIS — I130 Hypertensive heart and chronic kidney disease with heart failure and stage 1 through stage 4 chronic kidney disease, or unspecified chronic kidney disease: Secondary | ICD-10-CM | POA: Insufficient documentation

## 2024-04-16 DIAGNOSIS — R296 Repeated falls: Secondary | ICD-10-CM | POA: Diagnosis not present

## 2024-04-16 DIAGNOSIS — E11621 Type 2 diabetes mellitus with foot ulcer: Secondary | ICD-10-CM | POA: Insufficient documentation

## 2024-04-16 DIAGNOSIS — L97312 Non-pressure chronic ulcer of right ankle with fat layer exposed: Secondary | ICD-10-CM | POA: Insufficient documentation

## 2024-04-16 DIAGNOSIS — D631 Anemia in chronic kidney disease: Secondary | ICD-10-CM | POA: Insufficient documentation

## 2024-04-16 DIAGNOSIS — N184 Chronic kidney disease, stage 4 (severe): Secondary | ICD-10-CM | POA: Insufficient documentation

## 2024-04-16 DIAGNOSIS — L98422 Non-pressure chronic ulcer of back with fat layer exposed: Secondary | ICD-10-CM | POA: Diagnosis not present

## 2024-04-16 DIAGNOSIS — D509 Iron deficiency anemia, unspecified: Secondary | ICD-10-CM | POA: Diagnosis not present

## 2024-04-16 DIAGNOSIS — E1142 Type 2 diabetes mellitus with diabetic polyneuropathy: Secondary | ICD-10-CM | POA: Insufficient documentation

## 2024-04-16 DIAGNOSIS — I5042 Chronic combined systolic (congestive) and diastolic (congestive) heart failure: Secondary | ICD-10-CM | POA: Insufficient documentation

## 2024-04-16 DIAGNOSIS — I2510 Atherosclerotic heart disease of native coronary artery without angina pectoris: Secondary | ICD-10-CM | POA: Insufficient documentation

## 2024-04-16 DIAGNOSIS — L729 Follicular cyst of the skin and subcutaneous tissue, unspecified: Secondary | ICD-10-CM | POA: Diagnosis not present

## 2024-04-18 DIAGNOSIS — Z96641 Presence of right artificial hip joint: Secondary | ICD-10-CM | POA: Diagnosis not present

## 2024-04-18 DIAGNOSIS — R339 Retention of urine, unspecified: Secondary | ICD-10-CM | POA: Diagnosis not present

## 2024-04-18 DIAGNOSIS — Z9181 History of falling: Secondary | ICD-10-CM | POA: Diagnosis not present

## 2024-04-18 DIAGNOSIS — K760 Fatty (change of) liver, not elsewhere classified: Secondary | ICD-10-CM | POA: Diagnosis not present

## 2024-04-18 DIAGNOSIS — I25119 Atherosclerotic heart disease of native coronary artery with unspecified angina pectoris: Secondary | ICD-10-CM | POA: Diagnosis not present

## 2024-04-18 DIAGNOSIS — Z79891 Long term (current) use of opiate analgesic: Secondary | ICD-10-CM | POA: Diagnosis not present

## 2024-04-18 DIAGNOSIS — N183 Chronic kidney disease, stage 3 unspecified: Secondary | ICD-10-CM | POA: Diagnosis not present

## 2024-04-18 DIAGNOSIS — Z4800 Encounter for change or removal of nonsurgical wound dressing: Secondary | ICD-10-CM | POA: Diagnosis not present

## 2024-04-18 DIAGNOSIS — K219 Gastro-esophageal reflux disease without esophagitis: Secondary | ICD-10-CM | POA: Diagnosis not present

## 2024-04-18 DIAGNOSIS — J449 Chronic obstructive pulmonary disease, unspecified: Secondary | ICD-10-CM | POA: Diagnosis not present

## 2024-04-18 DIAGNOSIS — Z981 Arthrodesis status: Secondary | ICD-10-CM | POA: Diagnosis not present

## 2024-04-18 DIAGNOSIS — Z96612 Presence of left artificial shoulder joint: Secondary | ICD-10-CM | POA: Diagnosis not present

## 2024-04-18 DIAGNOSIS — Z7984 Long term (current) use of oral hypoglycemic drugs: Secondary | ICD-10-CM | POA: Diagnosis not present

## 2024-04-18 DIAGNOSIS — Z96611 Presence of right artificial shoulder joint: Secondary | ICD-10-CM | POA: Diagnosis not present

## 2024-04-18 DIAGNOSIS — I5032 Chronic diastolic (congestive) heart failure: Secondary | ICD-10-CM | POA: Diagnosis not present

## 2024-04-18 DIAGNOSIS — Z87891 Personal history of nicotine dependence: Secondary | ICD-10-CM | POA: Diagnosis not present

## 2024-04-18 DIAGNOSIS — I872 Venous insufficiency (chronic) (peripheral): Secondary | ICD-10-CM | POA: Diagnosis not present

## 2024-04-18 DIAGNOSIS — M48062 Spinal stenosis, lumbar region with neurogenic claudication: Secondary | ICD-10-CM | POA: Diagnosis not present

## 2024-04-18 DIAGNOSIS — D631 Anemia in chronic kidney disease: Secondary | ICD-10-CM | POA: Diagnosis not present

## 2024-04-18 DIAGNOSIS — I130 Hypertensive heart and chronic kidney disease with heart failure and stage 1 through stage 4 chronic kidney disease, or unspecified chronic kidney disease: Secondary | ICD-10-CM | POA: Diagnosis not present

## 2024-04-18 DIAGNOSIS — E7800 Pure hypercholesterolemia, unspecified: Secondary | ICD-10-CM | POA: Diagnosis not present

## 2024-04-18 DIAGNOSIS — E1122 Type 2 diabetes mellitus with diabetic chronic kidney disease: Secondary | ICD-10-CM | POA: Diagnosis not present

## 2024-04-18 DIAGNOSIS — L728 Other follicular cysts of the skin and subcutaneous tissue: Secondary | ICD-10-CM | POA: Diagnosis not present

## 2024-04-18 DIAGNOSIS — Z85828 Personal history of other malignant neoplasm of skin: Secondary | ICD-10-CM | POA: Diagnosis not present

## 2024-04-20 DIAGNOSIS — M48062 Spinal stenosis, lumbar region with neurogenic claudication: Secondary | ICD-10-CM | POA: Diagnosis not present

## 2024-04-20 DIAGNOSIS — J449 Chronic obstructive pulmonary disease, unspecified: Secondary | ICD-10-CM | POA: Diagnosis not present

## 2024-04-20 DIAGNOSIS — D631 Anemia in chronic kidney disease: Secondary | ICD-10-CM | POA: Diagnosis not present

## 2024-04-20 DIAGNOSIS — K219 Gastro-esophageal reflux disease without esophagitis: Secondary | ICD-10-CM | POA: Diagnosis not present

## 2024-04-20 DIAGNOSIS — Z87891 Personal history of nicotine dependence: Secondary | ICD-10-CM | POA: Diagnosis not present

## 2024-04-20 DIAGNOSIS — Z4800 Encounter for change or removal of nonsurgical wound dressing: Secondary | ICD-10-CM | POA: Diagnosis not present

## 2024-04-20 DIAGNOSIS — Z96641 Presence of right artificial hip joint: Secondary | ICD-10-CM | POA: Diagnosis not present

## 2024-04-20 DIAGNOSIS — Z96611 Presence of right artificial shoulder joint: Secondary | ICD-10-CM | POA: Diagnosis not present

## 2024-04-20 DIAGNOSIS — R339 Retention of urine, unspecified: Secondary | ICD-10-CM | POA: Diagnosis not present

## 2024-04-20 DIAGNOSIS — L728 Other follicular cysts of the skin and subcutaneous tissue: Secondary | ICD-10-CM | POA: Diagnosis not present

## 2024-04-20 DIAGNOSIS — E7800 Pure hypercholesterolemia, unspecified: Secondary | ICD-10-CM | POA: Diagnosis not present

## 2024-04-20 DIAGNOSIS — Z79891 Long term (current) use of opiate analgesic: Secondary | ICD-10-CM | POA: Diagnosis not present

## 2024-04-20 DIAGNOSIS — I5032 Chronic diastolic (congestive) heart failure: Secondary | ICD-10-CM | POA: Diagnosis not present

## 2024-04-20 DIAGNOSIS — Z9181 History of falling: Secondary | ICD-10-CM | POA: Diagnosis not present

## 2024-04-20 DIAGNOSIS — N183 Chronic kidney disease, stage 3 unspecified: Secondary | ICD-10-CM | POA: Diagnosis not present

## 2024-04-20 DIAGNOSIS — Z85828 Personal history of other malignant neoplasm of skin: Secondary | ICD-10-CM | POA: Diagnosis not present

## 2024-04-20 DIAGNOSIS — I25119 Atherosclerotic heart disease of native coronary artery with unspecified angina pectoris: Secondary | ICD-10-CM | POA: Diagnosis not present

## 2024-04-20 DIAGNOSIS — I130 Hypertensive heart and chronic kidney disease with heart failure and stage 1 through stage 4 chronic kidney disease, or unspecified chronic kidney disease: Secondary | ICD-10-CM | POA: Diagnosis not present

## 2024-04-20 DIAGNOSIS — K760 Fatty (change of) liver, not elsewhere classified: Secondary | ICD-10-CM | POA: Diagnosis not present

## 2024-04-20 DIAGNOSIS — I872 Venous insufficiency (chronic) (peripheral): Secondary | ICD-10-CM | POA: Diagnosis not present

## 2024-04-20 DIAGNOSIS — Z7984 Long term (current) use of oral hypoglycemic drugs: Secondary | ICD-10-CM | POA: Diagnosis not present

## 2024-04-20 DIAGNOSIS — E1122 Type 2 diabetes mellitus with diabetic chronic kidney disease: Secondary | ICD-10-CM | POA: Diagnosis not present

## 2024-04-20 DIAGNOSIS — Z981 Arthrodesis status: Secondary | ICD-10-CM | POA: Diagnosis not present

## 2024-04-20 DIAGNOSIS — Z96612 Presence of left artificial shoulder joint: Secondary | ICD-10-CM | POA: Diagnosis not present

## 2024-04-22 ENCOUNTER — Ambulatory Visit: Admitting: Family Medicine

## 2024-04-22 DIAGNOSIS — R339 Retention of urine, unspecified: Secondary | ICD-10-CM | POA: Diagnosis not present

## 2024-04-22 DIAGNOSIS — K760 Fatty (change of) liver, not elsewhere classified: Secondary | ICD-10-CM | POA: Diagnosis not present

## 2024-04-22 DIAGNOSIS — E1122 Type 2 diabetes mellitus with diabetic chronic kidney disease: Secondary | ICD-10-CM | POA: Diagnosis not present

## 2024-04-22 DIAGNOSIS — Z85828 Personal history of other malignant neoplasm of skin: Secondary | ICD-10-CM | POA: Diagnosis not present

## 2024-04-22 DIAGNOSIS — D631 Anemia in chronic kidney disease: Secondary | ICD-10-CM | POA: Diagnosis not present

## 2024-04-22 DIAGNOSIS — Z981 Arthrodesis status: Secondary | ICD-10-CM | POA: Diagnosis not present

## 2024-04-22 DIAGNOSIS — I130 Hypertensive heart and chronic kidney disease with heart failure and stage 1 through stage 4 chronic kidney disease, or unspecified chronic kidney disease: Secondary | ICD-10-CM | POA: Diagnosis not present

## 2024-04-22 DIAGNOSIS — L728 Other follicular cysts of the skin and subcutaneous tissue: Secondary | ICD-10-CM | POA: Diagnosis not present

## 2024-04-22 DIAGNOSIS — Z87891 Personal history of nicotine dependence: Secondary | ICD-10-CM | POA: Diagnosis not present

## 2024-04-22 DIAGNOSIS — M48062 Spinal stenosis, lumbar region with neurogenic claudication: Secondary | ICD-10-CM | POA: Diagnosis not present

## 2024-04-22 DIAGNOSIS — I872 Venous insufficiency (chronic) (peripheral): Secondary | ICD-10-CM | POA: Diagnosis not present

## 2024-04-22 DIAGNOSIS — K219 Gastro-esophageal reflux disease without esophagitis: Secondary | ICD-10-CM | POA: Diagnosis not present

## 2024-04-22 DIAGNOSIS — Z96612 Presence of left artificial shoulder joint: Secondary | ICD-10-CM | POA: Diagnosis not present

## 2024-04-22 DIAGNOSIS — Z4800 Encounter for change or removal of nonsurgical wound dressing: Secondary | ICD-10-CM | POA: Diagnosis not present

## 2024-04-22 DIAGNOSIS — Z96611 Presence of right artificial shoulder joint: Secondary | ICD-10-CM | POA: Diagnosis not present

## 2024-04-22 DIAGNOSIS — E7800 Pure hypercholesterolemia, unspecified: Secondary | ICD-10-CM | POA: Diagnosis not present

## 2024-04-22 DIAGNOSIS — J449 Chronic obstructive pulmonary disease, unspecified: Secondary | ICD-10-CM | POA: Diagnosis not present

## 2024-04-22 DIAGNOSIS — I25119 Atherosclerotic heart disease of native coronary artery with unspecified angina pectoris: Secondary | ICD-10-CM | POA: Diagnosis not present

## 2024-04-22 DIAGNOSIS — Z9181 History of falling: Secondary | ICD-10-CM | POA: Diagnosis not present

## 2024-04-22 DIAGNOSIS — N183 Chronic kidney disease, stage 3 unspecified: Secondary | ICD-10-CM | POA: Diagnosis not present

## 2024-04-22 DIAGNOSIS — Z96641 Presence of right artificial hip joint: Secondary | ICD-10-CM | POA: Diagnosis not present

## 2024-04-22 DIAGNOSIS — Z79891 Long term (current) use of opiate analgesic: Secondary | ICD-10-CM | POA: Diagnosis not present

## 2024-04-22 DIAGNOSIS — Z7984 Long term (current) use of oral hypoglycemic drugs: Secondary | ICD-10-CM | POA: Diagnosis not present

## 2024-04-22 DIAGNOSIS — I5032 Chronic diastolic (congestive) heart failure: Secondary | ICD-10-CM | POA: Diagnosis not present

## 2024-04-27 DIAGNOSIS — I872 Venous insufficiency (chronic) (peripheral): Secondary | ICD-10-CM | POA: Diagnosis not present

## 2024-04-27 DIAGNOSIS — D631 Anemia in chronic kidney disease: Secondary | ICD-10-CM | POA: Diagnosis not present

## 2024-04-27 DIAGNOSIS — I25119 Atherosclerotic heart disease of native coronary artery with unspecified angina pectoris: Secondary | ICD-10-CM | POA: Diagnosis not present

## 2024-04-27 DIAGNOSIS — E1122 Type 2 diabetes mellitus with diabetic chronic kidney disease: Secondary | ICD-10-CM | POA: Diagnosis not present

## 2024-04-27 DIAGNOSIS — M48062 Spinal stenosis, lumbar region with neurogenic claudication: Secondary | ICD-10-CM | POA: Diagnosis not present

## 2024-04-27 DIAGNOSIS — I5032 Chronic diastolic (congestive) heart failure: Secondary | ICD-10-CM | POA: Diagnosis not present

## 2024-04-27 DIAGNOSIS — L728 Other follicular cysts of the skin and subcutaneous tissue: Secondary | ICD-10-CM | POA: Diagnosis not present

## 2024-04-27 DIAGNOSIS — E7800 Pure hypercholesterolemia, unspecified: Secondary | ICD-10-CM | POA: Diagnosis not present

## 2024-04-27 DIAGNOSIS — K219 Gastro-esophageal reflux disease without esophagitis: Secondary | ICD-10-CM | POA: Diagnosis not present

## 2024-04-27 DIAGNOSIS — I130 Hypertensive heart and chronic kidney disease with heart failure and stage 1 through stage 4 chronic kidney disease, or unspecified chronic kidney disease: Secondary | ICD-10-CM | POA: Diagnosis not present

## 2024-04-27 DIAGNOSIS — N183 Chronic kidney disease, stage 3 unspecified: Secondary | ICD-10-CM | POA: Diagnosis not present

## 2024-04-27 DIAGNOSIS — J449 Chronic obstructive pulmonary disease, unspecified: Secondary | ICD-10-CM | POA: Diagnosis not present

## 2024-05-05 ENCOUNTER — Other Ambulatory Visit: Payer: Self-pay

## 2024-05-05 NOTE — Patient Instructions (Signed)
 Visit Information  Thank you for taking time to visit with me today. Please don't hesitate to contact me if I can be of assistance to you before our next scheduled appointment.  Your next care management appointment is no further scheduled appointments.    Closing From: Complex Care Management.  Please call the care guide team at [phone] if you need to cancel, schedule, or reschedule an appointment.   Please call the Suicide and Crisis Lifeline: 988 call the [place]  National Suicide Prevention Lifeline: 508 073 6807 or TTY: (915)023-5940 TTY 4407023144) to talk to a trained counselor call 1-800-273-TALK (toll free, 24 hour hotline) go to [facility] [address] [place] [phone]) call 911 if you are experiencing a Mental Health or Behavioral Health Crisis or need someone to talk to.   Nestora Duos, MSN, RN [facility], [facility] Health RN Care Manager Direct Dial: [phone] Fax: [phone]

## 2024-05-05 NOTE — Patient Outreach (Signed)
 Complex Care Management   Visit Note  05/05/2024  Name:  Mark Terry. MRN: [id number] DOB: [birth year]  Situation: Referral received for Complex Care Management related to Heart Failure, Diabetes with Complications, and Chronic Kidney Disease I obtained verbal consent from Patient.  Visit completed with Patient Daughter Olam  on the phone  Background:   Past Medical History:  Diagnosis Date   Anemia    Bruises easily    Diabetes mellitus without complication (HCC)    H/O esophagogastroduodenoscopy    H/O hiatal hernia    History of blood transfusion    no reaction noted   History of bronchitis    last time a couple of yrs ago   History of gastric ulcer    History of MRSA infection    Hyperlipidemia    Hypertension    Leg cramps    takes quinine  prn   Melanoma (HCC)    Panic attacks    Pneumonia    hx of last time a couple of yrs ago   PONV (postoperative nausea and vomiting)    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   Diagnosis: Squamous cell Carcinoma Location: Mid superior forehead Pre- operative size: 1cm x 1cm Total stages: 1 Post-Operative Size: 2.3cm x 2.1cm Date of Surgery: 05/01/2019    Assessment: Patient Reported Symptoms:  Cognitive Cognitive Status: No symptoms reported Cognitive/Intellectual Conditions Management [RPT]: None reported or documented in medical history or problem list   Health Maintenance Behaviors: Immunizations, Annual physical exam  Neurological Neurological Review of Symptoms: Numbness Neurological Comment: neuropathy, laying to sitting, goes slow due to dizziness with sitting up - unchanged  HEENT HEENT Symptoms Reported: No symptoms reported HEENT Management Strategies: Routine screening    Cardiovascular Cardiovascular Symptoms Reported: Swelling in legs or feet Does patient have uncontrolled Hypertension?: No Cardiovascular Management Strategies: Routine screening, Medication therapy, Medical device Weight: 206 lb  (93.4 kg) Cardiovascular Comment: Unna boots done yesterday - had blisters, noted healed yesterday still some swelling on right, no HF s, lasix  prn Sunday for 3 lb, lost 2 lb after  Respiratory Respiratory Symptoms Reported: No symptoms reported Respiratory Management Strategies: Routine screening  Endocrine Endocrine Symptoms Reported: No symptoms reported Is patient diabetic?: Yes Is patient checking blood sugars at home?: Yes List most recent blood sugar readings, include date and time of day: FBG 152 today no sx high/low,  152-179 range    Gastrointestinal Gastrointestinal Symptoms Reported: No symptoms reported Additional Gastrointestinal Details: stoll softner most days - manages constipation Gastrointestinal Management Strategies: Medication therapy Gastrointestinal Comment: constipation - occasional ensure, appetite ok, does not always like food provided - has own if needed    Genitourinary Genitourinary Symptoms Reported: No symptoms reported Genitourinary Comment: Oxybutinin - Holding Jardiance  per Nephrology after UTIs  Integumentary Additional Integumentary Details: unna boots - dressing changed yesterday, area on spine - wound care/HHC improved, still with discomfort, scant drainage, Skin Management Strategies: Dressing changes, Routine screening  Musculoskeletal Musculoskelatal Symptoms Reviewed: Difficulty walking, Limited mobility, Unsteady gait, Weakness Additional Musculoskeletal Details: wheelchair most of the time, no recent falls, rollator prn, getting PT - helping Musculoskeletal Management Strategies: Exercise, Routine screening Falls in the past year?: Yes Number of falls in past year: 2 or more Was there an injury with Fall?: No Fall Risk Category Calculator: 2 Patient Fall Risk Level: Moderate Fall Risk Patient at Risk for Falls Due to: History of fall(s), Impaired balance/gait, Impaired mobility Fall risk Follow up: Falls evaluation completed, Falls prevention  discussed  Psychosocial Psychosocial Symptoms Reported: Depression - if selected complete PHQ 2-9   Major Change/Loss/Stressor/Fears (CP): Medical condition, self Techniques to Cope with Loss/Stress/Change: Diversional activities, Exercise, Spiritual practice(s)      05/05/2024    PHQ2-9 Depression Screening   Little interest or pleasure in doing things Not at all  Feeling down, depressed, or hopeless Not at all  PHQ-2 - Total Score 0  Trouble falling or staying asleep, or sleeping too much    Feeling tired or having little energy    Poor appetite or overeating     Feeling bad about yourself - or that you are a failure or have let yourself or your family down    Trouble concentrating on things, such as reading the newspaper or watching television    Moving or speaking so slowly that other people could have noticed.  Or the opposite - being so fidgety or restless that you have been moving around a lot more than usual    Thoughts that you would be better off dead, or hurting yourself in some way    PHQ2-9 Total Score    If you checked off any problems, how difficult have these problems made it for you to do your work, take care of things at home, or get along with other people    Depression Interventions/Treatment      Vitals:   05/05/24 1016  BP: 126/66    Medications Reviewed Today     Reviewed by Devra Lands, RN (Registered Nurse) on 05/05/24 at 1011  Med List Status: <None>   Medication Order Taking? Sig Documenting Provider Last Dose Status Informant  ACCU-CHEK GUIDE TEST test strip [id number] Yes CHECK BLOOD SUGAR ONCE DAILY FOR TYPE 2 DIABETES Gasper Nancyann BRAVO, MD  Active   alfuzosin  (UROXATRAL ) 10 MG 24 hr tablet [id number] Yes Take 1 tablet (10 mg total) by mouth daily with breakfast. Take in place of doxazosin  Gasper Nancyann BRAVO, MD  Active   ascorbic acid  (VITAMIN C ) 500 MG tablet [id number]  Take 1 tablet (500 mg total) by mouth 2 (two) times daily.  Patient not  taking: Reported on 05/05/2024   Patel, Sona, MD  Active   aspirin  81 MG tablet [id number] Yes Take 81 mg by mouth daily. [provider]  Active Self           Med Note HALLIE, FELECIA N   Tue Oct 11, 2020 11:09 AM) Reports taking every other day.  DULoxetine  (CYMBALTA ) 30 MG capsule [id number] Yes TAKE 1 CAPSULE BY MOUTH EVERY DAY Gasper Nancyann BRAVO, MD  Active   empagliflozin  (JARDIANCE ) 25 MG TABS tablet [id number]  TAKE 1 TABLET BY MOUTH DAILY  Patient not taking: Reported on 04/09/2024   Gasper Nancyann BRAVO, MD  Active            Med Note LESLY, RICHERD CINDERELLA Schaumann Feb 06, 2024 10:38 AM) Restarted per Nephrology  empagliflozin  (JARDIANCE ) 25 MG TABS tablet [id number]  Take 25 mg by mouth daily.  Patient not taking: Reported on 05/05/2024   [provider]  Active   feeding supplement (ENSURE PLUS HIGH PROTEIN) LIQD [id number]  Take 237 mLs by mouth 2 (two) times daily between meals.  Patient not taking: Reported on 04/06/2024   Patel, Sona, MD  Active   fluticasone  (FLONASE ) 50 MCG/ACT nasal spray [id number] Yes USE 1 TO 2 SPRAYS IN BOTH  NOSTRILS DAILY Gasper Nancyann BRAVO, MD  Active   furosemide  (LASIX ) 40 MG tablet [id number]  Yes TAKE 1 TABLET BY MOUTH DAILY  Patient taking differently: Take 40 mg by mouth daily. Takes prn only for > 3 lb weight gain   Gasper Nancyann BRAVO, MD  Active   gabapentin  (NEURONTIN ) 300 MG capsule [id number] Yes TAKE 1 CAPSULE BY MOUTH 3 TIMES  DAILY  Patient taking differently: Take 300 mg by mouth 3 (three) times daily. Reports sometimes only takes BID - helps him sleep   Gasper Nancyann BRAVO, MD  Active   glimepiride  (AMARYL ) 1 MG tablet [id number] Yes TAKE 1 TABLET BY MOUTH DAILY Gasper Nancyann BRAVO, MD  Active   HYDROcodone -acetaminophen  (NORCO) 7.5-325 MG tablet [id number]  Take 1 tablet by mouth 2 (two) times daily. [provider]  Active   HYDROcodone -acetaminophen  (NORCO/VICODIN) 5-325 MG tablet [id number] Yes TAKE 1 TABLET BY MOUTH TWICE DAILY AS  NEEDED  Patient taking differently: Patient minimizes doses 1/2 am, 1/2 during afternoon as needed and 1 at night   [provider]  Active   levothyroxine  (SYNTHROID ) 50 MCG tablet [id number] Yes TAKE 1 TABLET BY MOUTH DAILY Gasper Nancyann BRAVO, MD  Active   loratadine  (CLARITIN ) 10 MG tablet [id number] Yes Take 10 mg by mouth daily. [provider]  Active Self  losartan  (COZAAR ) 25 MG tablet [id number]  Take 25 mg by mouth daily.  Patient not taking: Reported on 05/05/2024   [provider]  Active   metoprolol  tartrate (LOPRESSOR ) 25 MG tablet [id number] Yes TAKE 1 TABLET BY MOUTH TWICE  DAILY Gasper Nancyann BRAVO, MD  Active   Multiple Vitamin (MULTIVITAMIN WITH MINERALS) TABS [id number] Yes Take 1 tablet by mouth daily. [provider]  Active Self  omeprazole  (PRILOSEC) 40 MG capsule [id number] Yes TAKE 1 CAPSULE BY MOUTH DAILY Gasper Nancyann BRAVO, MD  Active   oxybutynin  (DITROPAN ) 5 MG tablet [id number] Yes TAKE 1 TABLET BY MOUTH TWICE  DAILY Gasper Nancyann BRAVO, MD  Active   simvastatin  (ZOCOR ) 40 MG tablet [id number] Yes TAKE 1 TABLET BY MOUTH AT  BEDTIME Gasper Nancyann BRAVO, MD  Active   spironolactone (ALDACTONE) 25 MG tablet [id number] Yes Take 25 mg by mouth. [provider]  Active   traZODone  (DESYREL ) 100 MG tablet [id number] Yes TAKE 1/2 TO 1 TABLET BY MOUTH AT BEDTIME AS NEEDED. FOR SLEEP Gasper Nancyann BRAVO, MD  Active   Vitamin D , Ergocalciferol , (DRISDOL ) 1.25 MG (50000 UNIT) CAPS capsule [id number] Yes Take 50,000 Units by mouth every 7 (seven) days. [provider]  Active   Med List Note Wendelyn Pulling, RN 08/26/18 9147): Medication agreement and Opiod contract signed 08/26/18 MR 10/31/18 UDS 08/26/18 Serum drug screen 05/30/19            Recommendation:   PCP Follow-up Continue Current Plan of Care  Follow Up Plan:   Closing From:  Complex Care Management  Nestora Duos, MSN, RN [facility]  [facility], [facility]  Health RN Care Manager Direct Dial: [phone] Fax: [phone]

## 2024-05-07 ENCOUNTER — Encounter: Admitting: Physician Assistant

## 2024-05-07 DIAGNOSIS — E11621 Type 2 diabetes mellitus with foot ulcer: Secondary | ICD-10-CM | POA: Diagnosis not present

## 2024-05-21 ENCOUNTER — Telehealth: Payer: Self-pay | Admitting: Family Medicine

## 2024-05-21 DIAGNOSIS — I5032 Chronic diastolic (congestive) heart failure: Secondary | ICD-10-CM

## 2024-05-21 DIAGNOSIS — E1121 Type 2 diabetes mellitus with diabetic nephropathy: Secondary | ICD-10-CM

## 2024-05-21 DIAGNOSIS — R3911 Hesitancy of micturition: Secondary | ICD-10-CM

## 2024-05-21 DIAGNOSIS — K219 Gastro-esophageal reflux disease without esophagitis: Secondary | ICD-10-CM

## 2024-05-21 MED ORDER — METOPROLOL TARTRATE 25 MG PO TABS: 25.0000 mg | ORAL_TABLET | Freq: Two times a day (BID) | ORAL | 4 refills | Status: AC

## 2024-05-21 MED ORDER — ALFUZOSIN HCL ER 10 MG PO TB24: 10.0000 mg | ORAL_TABLET | Freq: Every day | ORAL | 3 refills | Status: AC

## 2024-05-21 MED ORDER — OMEPRAZOLE 40 MG PO CPDR: 40.0000 mg | DELAYED_RELEASE_CAPSULE | Freq: Every day | ORAL | 3 refills | Status: AC

## 2024-05-21 MED ORDER — GLIMEPIRIDE 1 MG PO TABS: 1.0000 mg | ORAL_TABLET | Freq: Every day | ORAL | 4 refills | Status: AC

## 2024-05-21 NOTE — Telephone Encounter (Signed)
 Copied from CRM [id number]. Topic: Clinical - Prescription Issue >> May 21, 2024 10:09 AM Mark Terry wrote: Reason for CRM: Patient called and stated he is in the process of changing his insurance to [facility] at the beginning of the year. The pharmacy he uses through his current insurance which is [facility]  will not switch his prescriptions to [facility]. He stated he wants all of his medications switched over to [facility] (address listed as primary pharmacy) and does not want to use [facility] anymore.   Mark Terry- [phone] wants a call back once complete.

## 2024-05-21 NOTE — Addendum Note (Signed)
 Addended by: GASPER NANCYANN BRAVO on: 05/21/2024 05:06 PM   Modules accepted: Orders

## 2024-05-25 ENCOUNTER — Encounter: Attending: Physician Assistant | Admitting: Physician Assistant

## 2024-05-28 ENCOUNTER — Encounter: Admitting: Physician Assistant

## 2024-06-01 ENCOUNTER — Other Ambulatory Visit: Payer: Self-pay | Admitting: Family Medicine

## 2024-06-01 NOTE — Telephone Encounter (Unsigned)
 Copied from CRM [id number]. Topic: Clinical - Medication Refill >> Jun 01, 2024 10:31 AM [name] T wrote: Medication: fluticasone  (FLONASE ) 50 MCG/ACT nasal spray  Has the patient contacted their pharmacy? Yes (Agent: If no, request that the patient contact the pharmacy for the refill. If patient does not wish to contact the pharmacy document the reason why and proceed with request.) (Agent: If yes, when and what did the pharmacy advise?)  This is the patient's preferred pharmacy:  [facility] - [place], [place] - [address] [place] [address] Phone: [phone] Fax: [phone]  Is this the correct pharmacy for this prescription? Yes If no, delete pharmacy and type the correct one.   Has the prescription been filled recently? Yes  Is the patient out of the medication? Yes  Has the patient been seen for an appointment in the last year OR does the patient have an upcoming appointment? Yes  Can we respond through MyChart? Yes  Agent: Please be advised that Rx refills may take up to 3 business days. We ask that you follow-up with your pharmacy.

## 2024-06-02 MED ORDER — FLUTICASONE PROPIONATE 50 MCG/ACT NA SUSP: NASAL | 0 refills | Status: AC

## 2024-06-02 NOTE — Telephone Encounter (Signed)
 Requested Prescriptions  Pending Prescriptions Disp Refills   fluticasone  (FLONASE ) 50 MCG/ACT nasal spray 48 g 0    Sig: USE 1 TO 2 SPRAYS IN BOTH  NOSTRILS DAILY     Ear, Nose, and Throat: Nasal Preparations - Corticosteroids Passed - 06/02/2024 12:15 PM      Passed - Valid encounter within last 12 months    Recent Outpatient Visits           2 months ago Type 2 diabetes mellitus with diabetic neuropathy, without long-term current use of insulin  (HCC)   [place] [facility] Gasper Nancyann BRAVO, MD   3 months ago Aspiration pneumonia of right lower lobe, unspecified aspiration pneumonia type [facility])   [place] [facility] Gasper Nancyann BRAVO, MD   4 months ago Dizziness   [place] [facility] Gasper Nancyann BRAVO, MD   6 months ago Localized edema   [place] [facility] Gasper Nancyann BRAVO, MD   8 months ago Diabetes mellitus with nephropathy [facility])   [place] [facility] Gasper, Nancyann BRAVO, MD

## 2024-06-08 ENCOUNTER — Encounter: Admitting: Physician Assistant

## 2024-06-11 ENCOUNTER — Encounter: Attending: Physician Assistant | Admitting: Physician Assistant

## 2024-06-11 DIAGNOSIS — N184 Chronic kidney disease, stage 4 (severe): Secondary | ICD-10-CM | POA: Insufficient documentation

## 2024-06-11 DIAGNOSIS — I130 Hypertensive heart and chronic kidney disease with heart failure and stage 1 through stage 4 chronic kidney disease, or unspecified chronic kidney disease: Secondary | ICD-10-CM | POA: Diagnosis not present

## 2024-06-11 DIAGNOSIS — E1122 Type 2 diabetes mellitus with diabetic chronic kidney disease: Secondary | ICD-10-CM | POA: Diagnosis not present

## 2024-06-11 DIAGNOSIS — R296 Repeated falls: Secondary | ICD-10-CM | POA: Insufficient documentation

## 2024-06-11 DIAGNOSIS — L988 Other specified disorders of the skin and subcutaneous tissue: Secondary | ICD-10-CM | POA: Insufficient documentation

## 2024-06-11 DIAGNOSIS — I5042 Chronic combined systolic (congestive) and diastolic (congestive) heart failure: Secondary | ICD-10-CM | POA: Diagnosis not present

## 2024-06-11 DIAGNOSIS — I2510 Atherosclerotic heart disease of native coronary artery without angina pectoris: Secondary | ICD-10-CM | POA: Diagnosis not present

## 2024-06-11 DIAGNOSIS — E11621 Type 2 diabetes mellitus with foot ulcer: Secondary | ICD-10-CM | POA: Diagnosis present

## 2024-06-11 DIAGNOSIS — I87333 Chronic venous hypertension (idiopathic) with ulcer and inflammation of bilateral lower extremity: Secondary | ICD-10-CM | POA: Diagnosis not present

## 2024-06-11 DIAGNOSIS — L97822 Non-pressure chronic ulcer of other part of left lower leg with fat layer exposed: Secondary | ICD-10-CM | POA: Insufficient documentation

## 2024-06-11 DIAGNOSIS — L97522 Non-pressure chronic ulcer of other part of left foot with fat layer exposed: Secondary | ICD-10-CM | POA: Diagnosis not present

## 2024-06-11 DIAGNOSIS — D509 Iron deficiency anemia, unspecified: Secondary | ICD-10-CM | POA: Insufficient documentation

## 2024-06-11 DIAGNOSIS — I890 Lymphedema, not elsewhere classified: Secondary | ICD-10-CM | POA: Diagnosis not present

## 2024-06-11 DIAGNOSIS — L97412 Non-pressure chronic ulcer of right heel and midfoot with fat layer exposed: Secondary | ICD-10-CM | POA: Insufficient documentation

## 2024-06-11 DIAGNOSIS — L97312 Non-pressure chronic ulcer of right ankle with fat layer exposed: Secondary | ICD-10-CM | POA: Insufficient documentation

## 2024-06-11 DIAGNOSIS — E1143 Type 2 diabetes mellitus with diabetic autonomic (poly)neuropathy: Secondary | ICD-10-CM | POA: Diagnosis not present

## 2024-06-22 ENCOUNTER — Ambulatory Visit (INDEPENDENT_AMBULATORY_CARE_PROVIDER_SITE_OTHER): Admitting: Family Medicine

## 2024-06-22 ENCOUNTER — Encounter: Payer: Self-pay | Admitting: Family Medicine

## 2024-06-22 VITALS — BP 116/71 | HR 83 | Ht 61.0 in | Wt 208.0 lb

## 2024-06-22 DIAGNOSIS — L84 Corns and callosities: Secondary | ICD-10-CM

## 2024-06-22 DIAGNOSIS — E039 Hypothyroidism, unspecified: Secondary | ICD-10-CM | POA: Diagnosis not present

## 2024-06-22 DIAGNOSIS — I5032 Chronic diastolic (congestive) heart failure: Secondary | ICD-10-CM

## 2024-06-22 DIAGNOSIS — I10 Essential (primary) hypertension: Secondary | ICD-10-CM

## 2024-06-22 DIAGNOSIS — E1121 Type 2 diabetes mellitus with diabetic nephropathy: Secondary | ICD-10-CM

## 2024-06-22 DIAGNOSIS — Z7984 Long term (current) use of oral hypoglycemic drugs: Secondary | ICD-10-CM | POA: Diagnosis not present

## 2024-06-22 DIAGNOSIS — E1140 Type 2 diabetes mellitus with diabetic neuropathy, unspecified: Secondary | ICD-10-CM | POA: Diagnosis not present

## 2024-06-22 LAB — POCT GLYCOSYLATED HEMOGLOBIN (HGB A1C)
Est. average glucose Bld gHb Est-mCnc: 169
Hemoglobin A1C: 7.5 % — AB (ref 4.0–5.6)

## 2024-06-22 NOTE — Patient Instructions (Signed)
 SABRA  Please review the attached list of medications and notify my office if there are any errors.   . Please bring all of your medications to every appointment so we can make sure that our medication list is the same as yours.

## 2024-06-22 NOTE — Progress Notes (Signed)
 Established patient visit   Patient: Mark Terry.   DOB: 1934-08-17   89 y.o. Male  MRN: [id number] Visit Date: 06/22/2024  Today's healthcare provider: Nancyann Perry, MD   Chief Complaint  Patient presents with   Follow-up    3 month follow up   Subjective    Discussed the use of AI scribe software for clinical note transcription with the patient, who gave verbal consent to proceed.  History of Present Illness   Mark Terry. is an 89 year old male with type 2 diabetes who presents for follow-up.  His most recent hemoglobin A1c was 7.5% on July 9th. Blood sugar levels have been mostly stable, with occasional readings below 100 mg/dL, though today it was 860 mg/dL. He continues to take Jardiance  25 mg daily.  He has issues with calluses on his heels, which have worsened recently. He experiences discomfort both during the day and at night, particularly when resting his heels on the bed. He sees a podiatrist every two to three months for foot care. He has tried using a wrap around his heel but finds it ineffective. He wears diabetic shoes when going outdoors, which he believes help during the day.  He is currently taking oxybutynin  for overactive bladder and has been on it for several years. He also takes furosemide  as needed, particularly when his weight increases, at a dose of 40 mg.     Lab Results  Component Value Date   HGBA1C 7.5 (A) 06/22/2024   HGBA1C 7.5 (A) 01/15/2024   HGBA1C 7.9 (H) 09/25/2023   Lab Results  Component Value Date   NA 135 01/28/2024   K 5.3 (H) 01/28/2024   CREATININE 2.28 (H) 01/28/2024   GFRNONAA 27 (L) 01/28/2024   GLUCOSE 201 (H) 01/28/2024   Lab Results  Component Value Date   WBC 4.7 01/27/2024   HGB 9.7 (L) 01/27/2024   HCT 30.5 (L) 01/27/2024   MCV 99.7 01/27/2024   PLT 102 (L) 01/27/2024     Medications: Show/hide medication list[1]  Review of Systems  Constitutional:  Negative for appetite change, chills and  fever.  Respiratory:  Negative for chest tightness, shortness of breath and wheezing.   Cardiovascular:  Negative for chest pain and palpitations.  Gastrointestinal:  Negative for abdominal pain, nausea and vomiting.       Objective    BP 116/71 (BP Location: Left Arm, Patient Position: Sitting, Cuff Size: Normal)   Pulse 83   Ht 5' 1 (1.549 m)   Wt 208 lb (94.3 kg)   SpO2 99%   BMI 39.30 kg/m   Physical Exam   General: Appearance:    Mildly obese male in no acute distress  Eyes:    PERRL, conjunctiva/corneas clear, EOM's intact       Lungs:     Clear to auscultation bilaterally, respirations unlabored  Heart:    Normal heart rate. Normal rhythm. No murmurs, rubs, or gallops.    MS:   All extremities are intact.  Thick discrete tender callus posterior aspect of both heels. No erythema or surrounding tenderness.   Neurologic:   Awake, alert, oriented x 3. No apparent focal neurological defect.         Results for orders placed or performed in visit on 06/22/24  POCT glycosylated hemoglobin (Hb A1C)  Result Value Ref Range   Hemoglobin A1C 7.5 (A) 4.0 - 5.6 %   Est. 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 Occasional hypoglycemia noted. - Continue current diabetic medications including Jardiance  25 mg.  Heel callus Thick, inflamed callus on right heel causing discomfort. - Referred to podiatrist for callus removal. - Advised elevating foot with a pillow under the ankle.  Essential hypertension Well controlled.  Continue current medications.    Hypothyroidism Annual thyroid  function tests due. - Ordered thyroid  function tests.  Chronic diastolic congestive heart failure (HCC) Well compensated. Continue current medications.   - Lipid panel - CBC - Hepatic function panel       Return in about 4 months (around  10/21/2024).     Nancyann Perry, MD  [facility] [phone] (phone) [phone] (fax)  [facility]    [1]  Outpatient Medications Prior to Visit  Medication Sig   ACCU-CHEK GUIDE TEST test strip CHECK BLOOD SUGAR ONCE DAILY FOR TYPE 2 DIABETES   alfuzosin  (UROXATRAL ) 10 MG 24 hr tablet Take 1 tablet (10 mg total) by mouth daily with breakfast.   ascorbic acid  (VITAMIN C ) 500 MG tablet Take 1 tablet (500 mg total) by mouth 2 (two) times daily.   aspirin  81 MG tablet Take 81 mg by mouth daily.   DULoxetine  (CYMBALTA ) 30 MG capsule TAKE 1 CAPSULE BY MOUTH EVERY DAY   empagliflozin  (JARDIANCE ) 25 MG TABS tablet TAKE 1 TABLET BY MOUTH DAILY   feeding supplement (ENSURE PLUS HIGH PROTEIN) LIQD Take 237 mLs by mouth 2 (two) times daily between meals.   fluticasone  (FLONASE ) 50 MCG/ACT nasal spray USE 1 TO 2 SPRAYS IN BOTH  NOSTRILS DAILY   furosemide  (LASIX ) 40 MG tablet TAKE 1 TABLET BY MOUTH DAILY (Patient taking differently: Take 40 mg by mouth daily. Takes prn only for > 3 lb weight gain)   gabapentin  (NEURONTIN ) 300 MG capsule TAKE 1 CAPSULE BY MOUTH 3 TIMES  DAILY (Patient taking differently: Take 300 mg by mouth 3 (three) times daily. Reports sometimes only takes BID - helps him sleep)   glimepiride  (AMARYL ) 1 MG tablet Take 1 tablet (1 mg total) by mouth daily.   HYDROcodone -acetaminophen  (NORCO) 7.5-325 MG tablet Take 1 tablet by mouth 2 (two) times daily.   HYDROcodone -acetaminophen  (NORCO/VICODIN) 5-325 MG tablet TAKE 1 TABLET BY MOUTH TWICE DAILY AS NEEDED (Patient taking differently: Patient minimizes doses 1/2 am, 1/2 during afternoon as needed and 1 at night)   levothyroxine  (SYNTHROID ) 50 MCG tablet TAKE 1 TABLET BY MOUTH DAILY   loratadine  (CLARITIN ) 10 MG tablet Take 10 mg by mouth daily.   losartan  (COZAAR ) 25 MG tablet Take 25 mg by mouth daily.   metoprolol  tartrate (LOPRESSOR ) 25 MG tablet Take 1 tablet (25 mg total) by mouth 2  (two) times daily.   Multiple Vitamin (MULTIVITAMIN WITH MINERALS) TABS Take 1 tablet by mouth daily.   omeprazole  (PRILOSEC) 40 MG capsule Take 1 capsule (40 mg total) by mouth daily.   oxybutynin  (DITROPAN ) 5 MG tablet TAKE 1 TABLET BY MOUTH TWICE  DAILY   simvastatin  (ZOCOR ) 40 MG tablet TAKE 1 TABLET BY MOUTH AT  BEDTIME   spironolactone (ALDACTONE) 25 MG tablet Take 25 mg by mouth.   traZODone  (DESYREL ) 100 MG tablet TAKE 1/2 TO 1 TABLET BY MOUTH AT BEDTIME AS NEEDED. FOR SLEEP   Vitamin D , Ergocalciferol , (DRISDOL ) 1.25 MG (50000 UNIT) CAPS capsule Take 50,000 Units by mouth  every 7 (seven) days.   [DISCONTINUED] empagliflozin  (JARDIANCE ) 25 MG TABS tablet Take 25 mg by mouth daily.   No facility-administered medications prior to visit.

## 2024-06-23 ENCOUNTER — Ambulatory Visit: Payer: Self-pay | Admitting: Family Medicine

## 2024-06-23 LAB — HEPATIC FUNCTION PANEL
ALT: 35 IU/L (ref 0–44)
AST: 27 IU/L (ref 0–40)
Albumin: 3.7 g/dL (ref 3.7–4.7)
Alkaline Phosphatase: 75 IU/L (ref 48–129)
Bilirubin Total: 0.2 mg/dL (ref 0.0–1.2)
Bilirubin, Direct: 0.11 mg/dL (ref 0.00–0.40)
Total Protein: 6.1 g/dL (ref 6.0–8.5)

## 2024-06-23 LAB — CBC
Hematocrit: 31.7 % — ABNORMAL LOW (ref 37.5–51.0)
Hemoglobin: 10.7 g/dL — ABNORMAL LOW (ref 13.0–17.7)
MCH: 32.5 pg (ref 26.6–33.0)
MCHC: 33.8 g/dL (ref 31.5–35.7)
MCV: 96 fL (ref 79–97)
Platelets: 96 x10E3/uL — CL (ref 150–450)
RBC: 3.29 x10E6/uL — ABNORMAL LOW (ref 4.14–5.80)
RDW: 14.3 % (ref 11.6–15.4)
WBC: 5.7 x10E3/uL (ref 3.4–10.8)

## 2024-06-23 LAB — LIPID PANEL
Chol/HDL Ratio: 4 ratio (ref 0.0–5.0)
Cholesterol, Total: 158 mg/dL (ref 100–199)
HDL: 40 mg/dL (ref >39)
LDL Chol Calc (NIH): 54 mg/dL (ref 0–99)
Triglycerides: 426 mg/dL — ABNORMAL HIGH (ref 0–149)
VLDL Cholesterol Cal: 64 mg/dL — ABNORMAL HIGH (ref 5–40)

## 2024-06-23 LAB — TSH+FREE T4
Free T4: 0.99 ng/dL (ref 0.82–1.77)
TSH: 3.75 u[IU]/mL (ref 0.450–4.500)

## 2024-06-24 DIAGNOSIS — E7800 Pure hypercholesterolemia, unspecified: Secondary | ICD-10-CM | POA: Diagnosis not present

## 2024-06-24 DIAGNOSIS — K219 Gastro-esophageal reflux disease without esophagitis: Secondary | ICD-10-CM | POA: Diagnosis not present

## 2024-06-24 DIAGNOSIS — N183 Chronic kidney disease, stage 3 unspecified: Secondary | ICD-10-CM | POA: Diagnosis not present

## 2024-06-24 DIAGNOSIS — I872 Venous insufficiency (chronic) (peripheral): Secondary | ICD-10-CM | POA: Diagnosis not present

## 2024-06-24 DIAGNOSIS — J449 Chronic obstructive pulmonary disease, unspecified: Secondary | ICD-10-CM | POA: Diagnosis not present

## 2024-06-24 DIAGNOSIS — L728 Other follicular cysts of the skin and subcutaneous tissue: Secondary | ICD-10-CM | POA: Diagnosis not present

## 2024-06-24 DIAGNOSIS — M48062 Spinal stenosis, lumbar region with neurogenic claudication: Secondary | ICD-10-CM | POA: Diagnosis not present

## 2024-06-24 DIAGNOSIS — I130 Hypertensive heart and chronic kidney disease with heart failure and stage 1 through stage 4 chronic kidney disease, or unspecified chronic kidney disease: Secondary | ICD-10-CM | POA: Diagnosis not present

## 2024-06-24 DIAGNOSIS — E1122 Type 2 diabetes mellitus with diabetic chronic kidney disease: Secondary | ICD-10-CM | POA: Diagnosis not present

## 2024-06-24 DIAGNOSIS — Z4800 Encounter for change or removal of nonsurgical wound dressing: Secondary | ICD-10-CM | POA: Diagnosis not present

## 2024-06-24 DIAGNOSIS — I5032 Chronic diastolic (congestive) heart failure: Secondary | ICD-10-CM | POA: Diagnosis not present

## 2024-06-24 DIAGNOSIS — D631 Anemia in chronic kidney disease: Secondary | ICD-10-CM | POA: Diagnosis not present

## 2024-07-07 ENCOUNTER — Encounter: Admitting: Physician Assistant

## 2024-07-07 DIAGNOSIS — E11621 Type 2 diabetes mellitus with foot ulcer: Secondary | ICD-10-CM | POA: Diagnosis not present

## 2024-07-08 ENCOUNTER — Ambulatory Visit: Payer: Self-pay

## 2024-07-08 NOTE — Telephone Encounter (Signed)
 FYI Only or Action Required?: Action required by provider: update on patient condition.  Patient was last seen in primary care on 06/22/2024 by Gasper Nancyann BRAVO, MD.  Called Nurse Triage reporting Leg Swelling.  Symptoms began several days ago.  Interventions attempted: Prescription medications: lasix .  Symptoms are: gradually worsening.  Triage Disposition: Call PCP Within 24 Hours  Patient/caregiver understands and will follow disposition?: Yes   Copied from CRM #[id number]. Topic: Clinical - Red Word Triage >> Jul 08, 2024  3:23 PM Wess RAMAN wrote: Red Word that prompted transfer to Nurse Triage: Mark Terry from [facility] stated patient has lots of weight gain   211 on monday, 216 on today.  She is concerned that his fluid pills are not working. Reason for Disposition  [1] Weight GAIN > 5 lbs (2 kg) in one week OR [2] 5 lbs (2 kg) over target weight.  Answer Assessment - Initial Assessment Questions 1. MAIN CONCERN OR SYMPTOM: What is your main concern right now? What's the main symptom you're worried about? (e.g., breathing difficulty, ankle swelling, weight gain).     Wt gain 2. ONSET: When did the  wt gain  start?     This week 3. BREATHING DIFFICULTY: Are you having any difficulty breathing? If Yes, ask: How bad is it?  (e.g., none, mild, moderate, severe)  Is this worse than usual for you?     No SOB 4. EDEMA - FOOT-LEG SWELLING: Do you have swelling of your ankles, feet or legs? If Yes, ask: How bad is the swelling? (e.g., localized; mild, moderate, severe)     2+ pitting edema, unchanged from baseline 5. WEIGHT - CURRENT: What is your weight today?     216 6. WEIGHT - TARGET RANGE: Do you try to keep your weight in a target (goal) range? If Yes, ask: What is that range?      7. WEIGHT - CHANGE: Have you gained (or lost) weight in the past 24 hours? Past week (7 days)? If Yes, ask:  How much weight?     Has been 208, Monday was 211,  today 216 8. OTHER SYMPTOMS: Do you have any other symptoms? (e.g., depression, weakness or fatigue, abdomen bloating, hacky cough)     No other symptoms 9. DIURETICS: Are you currently taking water pills? (e.g., furosemide  [Lasix ], hydrochlorothiazide [HCTZ], bumetanide [Bumex], metolazone [Zaroxolyn]) If Yes, ask: What medicine are you taking, and how often?  Any recent change in dose?      Lasix , didn't take yesterday, Did take monday 10. O2 SATURATION MONITOR: Do you use an oxygen saturation monitor (pulse oximeter) at home? If Yes, ask: What is your reading (oxygen level) today? What is your usual oxygen saturation reading? (e.g., 95%)        11. HEART FAILURE HCP: Who treats your heart failure?  (e.g., cardiologist or heart specialist, heart failure clinic or center, primary care doctor)        12. FLUID and SODIUM RESTRICTIONS: Have you been instructed to restrict your daily fluid intake or sodium (salt) intake? If Yes, ask: What are your daily limits?  Protocols used: Heart Failure on Treatment Follow-up Call-A-[facility]

## 2024-07-14 ENCOUNTER — Telehealth: Payer: Self-pay

## 2024-07-14 NOTE — Telephone Encounter (Signed)
 Copied from CRM [id number]. Topic: General - Other >> Jul 14, 2024 11:01 AM Emylou G wrote: Reason for CRM: Low BP 90/50-90/45 .SABRA This was in the chair and recliner both about the same.. needed to report to us  .. He is also on antibiotic.SABRA

## 2024-07-24 ENCOUNTER — Telehealth: Payer: Self-pay | Admitting: Family Medicine

## 2024-07-24 NOTE — Telephone Encounter (Signed)
 Copied from CRM [id number]. Topic: Clinical - Medication Question >> Jul 24, 2024  4:13 PM Fonda T wrote: Reason for CRM: [name] with [facility] calling, reports that patient has been taking medication, Lasix  everyday, but pt weight is increasing. All other vitals are stable.  Reports she thinks pt should maybe try another diuretic medication.  Requesting a return call back to discuss further.  Jennifer ph. [phone]

## 2024-07-24 NOTE — Telephone Encounter (Signed)
 Furosemide  is written to take prn and it has not been refilled since April. He said he only takes it occasionally at his last visit in December. Please verify with patient how often he has been taking it.

## 2024-07-27 NOTE — Telephone Encounter (Unsigned)
 Copied from CRM #[id number]. Topic: General - Other >> Jul 27, 2024  3:16 PM Winona R wrote: Pt daughter and father called back and stated pt is taking furosemide  everyday for at least 2 weeks since his hh nurse [name] noticed the change in weight.

## 2024-07-27 NOTE — Telephone Encounter (Signed)
 Have LVM requesting patient give the office a call back when able.  Ok for E2C2 to obtain information from [patient regarding how often he is taking the furosemide 

## 2024-07-30 ENCOUNTER — Encounter: Attending: Physician Assistant | Admitting: Physician Assistant

## 2024-07-30 DIAGNOSIS — I2510 Atherosclerotic heart disease of native coronary artery without angina pectoris: Secondary | ICD-10-CM | POA: Insufficient documentation

## 2024-07-30 DIAGNOSIS — E11621 Type 2 diabetes mellitus with foot ulcer: Secondary | ICD-10-CM | POA: Insufficient documentation

## 2024-07-30 DIAGNOSIS — L97412 Non-pressure chronic ulcer of right heel and midfoot with fat layer exposed: Secondary | ICD-10-CM | POA: Insufficient documentation

## 2024-07-30 DIAGNOSIS — L97822 Non-pressure chronic ulcer of other part of left lower leg with fat layer exposed: Secondary | ICD-10-CM | POA: Diagnosis not present

## 2024-07-30 DIAGNOSIS — I87333 Chronic venous hypertension (idiopathic) with ulcer and inflammation of bilateral lower extremity: Secondary | ICD-10-CM | POA: Insufficient documentation

## 2024-07-30 DIAGNOSIS — L97522 Non-pressure chronic ulcer of other part of left foot with fat layer exposed: Secondary | ICD-10-CM | POA: Diagnosis not present

## 2024-07-30 DIAGNOSIS — R296 Repeated falls: Secondary | ICD-10-CM | POA: Diagnosis not present

## 2024-07-30 DIAGNOSIS — I890 Lymphedema, not elsewhere classified: Secondary | ICD-10-CM | POA: Diagnosis not present

## 2024-07-30 DIAGNOSIS — E1143 Type 2 diabetes mellitus with diabetic autonomic (poly)neuropathy: Secondary | ICD-10-CM | POA: Insufficient documentation

## 2024-07-30 DIAGNOSIS — I130 Hypertensive heart and chronic kidney disease with heart failure and stage 1 through stage 4 chronic kidney disease, or unspecified chronic kidney disease: Secondary | ICD-10-CM | POA: Diagnosis not present

## 2024-07-30 DIAGNOSIS — N184 Chronic kidney disease, stage 4 (severe): Secondary | ICD-10-CM | POA: Diagnosis not present

## 2024-07-30 DIAGNOSIS — D509 Iron deficiency anemia, unspecified: Secondary | ICD-10-CM | POA: Insufficient documentation

## 2024-07-30 DIAGNOSIS — L988 Other specified disorders of the skin and subcutaneous tissue: Secondary | ICD-10-CM | POA: Diagnosis not present

## 2024-07-30 DIAGNOSIS — L97312 Non-pressure chronic ulcer of right ankle with fat layer exposed: Secondary | ICD-10-CM | POA: Diagnosis not present

## 2024-07-30 DIAGNOSIS — I5042 Chronic combined systolic (congestive) and diastolic (congestive) heart failure: Secondary | ICD-10-CM | POA: Insufficient documentation

## 2024-07-30 DIAGNOSIS — E1122 Type 2 diabetes mellitus with diabetic chronic kidney disease: Secondary | ICD-10-CM | POA: Insufficient documentation

## 2024-08-10 ENCOUNTER — Telehealth: Payer: Self-pay

## 2024-08-10 NOTE — Telephone Encounter (Signed)
 Copied from CRM #[id number]. Topic: Clinical - Order For Equipment >> Aug 10, 2024  1:44 PM Zy'onna H wrote: Reason for CRM: Daughter called in requesting New Motorized Wheelchair  **Patient wants to Medicare will help cover costs.**  He has a [employer] Please Callback Daughter at the following number with any Questions: [phone]

## 2024-08-11 NOTE — Telephone Encounter (Signed)
 Medicare requires a face-face visit solely for the purpose of determining and documenting medical necessity for motorized wheelchair.

## 2024-08-13 ENCOUNTER — Telehealth: Payer: Self-pay

## 2024-08-13 NOTE — Telephone Encounter (Signed)
 Copied from CRM [id number]. Topic: Clinical - Home Health Verbal Orders >> Aug 13, 2024 10:44 AM Tiffini S wrote: Caller/Agency: Randine with [facility] Number: [phone] Service Requested: Physical Therapy, Skilled Nursing  Frequency: order for the evaluation  Any new concerns about the patient? 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

## 2024-08-13 NOTE — Telephone Encounter (Signed)
 That's fine

## 2024-08-14 NOTE — Telephone Encounter (Signed)
 LVM regarding verbal order to call back.  [id number]- if call is returned by Randine please advise on ok verbal orders.

## 2024-08-18 ENCOUNTER — Ambulatory Visit

## 2024-08-28 ENCOUNTER — Encounter: Admitting: Physician Assistant

## 2024-10-26 ENCOUNTER — Ambulatory Visit: Admitting: Family Medicine
